# Patient Record
Sex: Male | Born: 1945
Health system: Southern US, Community
[De-identification: ages and names within clinical notes are randomized; demographics above are authoritative.]

## PROBLEM LIST (undated history)

## (undated) DIAGNOSIS — E119 Type 2 diabetes mellitus without complications: Secondary | ICD-10-CM

## (undated) DIAGNOSIS — M1990 Unspecified osteoarthritis, unspecified site: Secondary | ICD-10-CM

## (undated) DIAGNOSIS — R109 Unspecified abdominal pain: Secondary | ICD-10-CM

## (undated) DIAGNOSIS — H00019 Hordeolum externum unspecified eye, unspecified eyelid: Secondary | ICD-10-CM

## (undated) DIAGNOSIS — B0229 Other postherpetic nervous system involvement: Secondary | ICD-10-CM

## (undated) DIAGNOSIS — R51 Headache: Secondary | ICD-10-CM

## (undated) DIAGNOSIS — M545 Low back pain: Secondary | ICD-10-CM

## (undated) DIAGNOSIS — K219 Gastro-esophageal reflux disease without esophagitis: Secondary | ICD-10-CM

## (undated) DIAGNOSIS — B029 Zoster without complications: Secondary | ICD-10-CM

## (undated) DIAGNOSIS — R42 Dizziness and giddiness: Secondary | ICD-10-CM

## (undated) DIAGNOSIS — G40909 Epilepsy, unspecified, not intractable, without status epilepticus: Secondary | ICD-10-CM

## (undated) DIAGNOSIS — J309 Allergic rhinitis, unspecified: Secondary | ICD-10-CM

## (undated) DIAGNOSIS — Z0000 Encounter for general adult medical examination without abnormal findings: Secondary | ICD-10-CM

## (undated) DIAGNOSIS — R569 Unspecified convulsions: Secondary | ICD-10-CM

## (undated) DIAGNOSIS — R52 Pain, unspecified: Secondary | ICD-10-CM

## (undated) HISTORY — DX: Unspecified convulsions: R56.9

## (undated) HISTORY — DX: Dizziness and giddiness: R42

## (undated) HISTORY — DX: Other postherpetic nervous system involvement: B02.29

## (undated) HISTORY — PX: UPPER GASTROINTESTINAL ENDOSCOPY: SHX188

## (undated) HISTORY — PX: KNEE SURGERY: SHX244

## (undated) HISTORY — DX: Allergic rhinitis, unspecified: J30.9

## (undated) HISTORY — DX: Gastro-esophageal reflux disease without esophagitis: K21.9

## (undated) HISTORY — DX: Unspecified abdominal pain: R10.9

## (undated) HISTORY — DX: Headache: R51

## (undated) HISTORY — DX: Encounter for general adult medical examination without abnormal findings: Z00.00

## (undated) HISTORY — PX: OTHER SURGICAL HISTORY: SHX169

## (undated) HISTORY — DX: Epilepsy, unspecified, not intractable, without status epilepticus: G40.909

## (undated) HISTORY — DX: Pain, unspecified: R52

## (undated) HISTORY — PX: COLONOSCOPY: SHX174

## (undated) HISTORY — DX: Hordeolum externum unspecified eye, unspecified eyelid: H00.019

## (undated) HISTORY — DX: Zoster without complications: B02.9

## (undated) HISTORY — DX: Type 2 diabetes mellitus without complications: E11.9

## (undated) HISTORY — DX: Low back pain: M54.5

---

## 2014-05-07 ENCOUNTER — Other Ambulatory Visit (INDEPENDENT_AMBULATORY_CARE_PROVIDER_SITE_OTHER): Payer: MEDICARE

## 2014-05-07 ENCOUNTER — Encounter: Payer: Self-pay | Admitting: Internal Medicine

## 2014-05-07 ENCOUNTER — Ambulatory Visit (INDEPENDENT_AMBULATORY_CARE_PROVIDER_SITE_OTHER): Payer: MEDICARE | Admitting: Internal Medicine

## 2014-05-07 VITALS — BP 126/86 | HR 48 | Temp 97.9°F | Resp 14 | Ht 76.0 in | Wt 245.6 lb

## 2014-05-07 DIAGNOSIS — Z23 Encounter for immunization: Secondary | ICD-10-CM

## 2014-05-07 DIAGNOSIS — Z0000 Encounter for general adult medical examination without abnormal findings: Secondary | ICD-10-CM

## 2014-05-07 DIAGNOSIS — H00016 Hordeolum externum left eye, unspecified eyelid: Secondary | ICD-10-CM

## 2014-05-07 DIAGNOSIS — J309 Allergic rhinitis, unspecified: Secondary | ICD-10-CM | POA: Insufficient documentation

## 2014-05-07 DIAGNOSIS — G40909 Epilepsy, unspecified, not intractable, without status epilepticus: Secondary | ICD-10-CM | POA: Insufficient documentation

## 2014-05-07 DIAGNOSIS — H00019 Hordeolum externum unspecified eye, unspecified eyelid: Secondary | ICD-10-CM | POA: Insufficient documentation

## 2014-05-07 DIAGNOSIS — Z418 Encounter for other procedures for purposes other than remedying health state: Secondary | ICD-10-CM

## 2014-05-07 DIAGNOSIS — Z299 Encounter for prophylactic measures, unspecified: Secondary | ICD-10-CM

## 2014-05-07 DIAGNOSIS — J3089 Other allergic rhinitis: Secondary | ICD-10-CM

## 2014-05-07 HISTORY — DX: Hordeolum externum unspecified eye, unspecified eyelid: H00.019

## 2014-05-07 HISTORY — DX: Epilepsy, unspecified, not intractable, without status epilepticus: G40.909

## 2014-05-07 HISTORY — DX: Encounter for general adult medical examination without abnormal findings: Z00.00

## 2014-05-07 HISTORY — DX: Allergic rhinitis, unspecified: J30.9

## 2014-05-07 LAB — COMPREHENSIVE METABOLIC PANEL
ALT: 8 U/L (ref 0–53)
AST: 18 U/L (ref 0–37)
Albumin: 4 g/dL (ref 3.5–5.2)
Alkaline Phosphatase: 42 U/L (ref 39–117)
BUN: 16 mg/dL (ref 6–23)
CO2: 30 mEq/L (ref 19–32)
Calcium: 9.1 mg/dL (ref 8.4–10.5)
Chloride: 101 mEq/L (ref 96–112)
Creatinine, Ser: 1.1 mg/dL (ref 0.4–1.5)
GFR: 89.86 mL/min (ref >60.00)
Glucose, Bld: 99 mg/dL (ref 70–99)
Potassium: 3.9 mEq/L (ref 3.5–5.1)
Sodium: 136 mEq/L (ref 135–145)
Total Bilirubin: 0.5 mg/dL (ref 0.2–1.2)
Total Protein: 7.6 g/dL (ref 6.0–8.3)

## 2014-05-07 LAB — LIPID PANEL
Cholesterol: 183 mg/dL (ref 0–200)
HDL: 44.1 mg/dL (ref >39.00)
LDL Cholesterol: 124 mg/dL — ABNORMAL HIGH (ref 0–99)
NonHDL: 138.9
Total CHOL/HDL Ratio: 4
Triglycerides: 75 mg/dL (ref 0.0–149.0)
VLDL: 15 mg/dL (ref 0.0–40.0)

## 2014-05-07 LAB — CBC
HCT: 43 % (ref 39.0–52.0)
Hemoglobin: 13.5 g/dL (ref 13.0–17.0)
MCHC: 31.5 g/dL (ref 30.0–36.0)
MCV: 83.5 fl (ref 78.0–100.0)
Platelets: 215 10*3/uL (ref 150.0–400.0)
RBC: 5.15 Mil/uL (ref 4.22–5.81)
RDW: 14.3 % (ref 11.5–15.5)
WBC: 5.3 10*3/uL (ref 4.0–10.5)

## 2014-05-07 LAB — HEMOGLOBIN A1C: Hgb A1c MFr Bld: 6.5 % (ref 4.6–6.5)

## 2014-05-07 MED ORDER — DIVALPROEX SODIUM 250 MG PO DR TAB: 250.0000 mg | DELAYED_RELEASE_TABLET | Freq: Two times a day (BID) | ORAL | Status: DC

## 2014-05-07 MED ORDER — ACYCLOVIR 400 MG PO TABS: 400.0000 mg | ORAL_TABLET | Freq: Three times a day (TID) | ORAL | Status: DC

## 2014-05-07 MED ORDER — ACYCLOVIR 5 % EX CREA: 1.0000 "application " | TOPICAL_CREAM | CUTANEOUS | Status: DC

## 2014-05-07 MED ORDER — FLUTICASONE PROPIONATE 50 MCG/ACT NA SUSP: 2.0000 | Freq: Every day | NASAL | Status: DC

## 2014-05-07 NOTE — Progress Notes (Signed)
Pre visit review using our clinic review tool, if applicable. No additional management support is needed unless otherwise documented below in the visit note. 

## 2014-05-07 NOTE — Assessment & Plan Note (Signed)
Refill his Depakote. At some point he will need to establish with neurology in town, especially if he wishes to trial going off his seizure medication. We'll need to obtain records of his normal EEG/MRI.

## 2014-05-07 NOTE — Patient Instructions (Signed)
We have sent in refills of your medicines. We will give you the pneumonia shot today.  We will check on your blood work and call you back with the results. We would like to see you back in about 6-12 months.  Health Maintenance A healthy lifestyle and preventative care can promote health and wellness.  Maintain regular health, dental, and eye exams.  Eat a healthy diet. Foods like vegetables, fruits, whole grains, low-fat dairy products, and lean protein foods contain the nutrients you need and are low in calories. Decrease your intake of foods high in solid fats, added sugars, and salt. Get information about a proper diet from your health care provider, if necessary.  Regular physical exercise is one of the most important things you can do for your health. Most adults should get at least 150 minutes of moderate-intensity exercise (any activity that increases your heart rate and causes you to sweat) each week. In addition, most adults need muscle-strengthening exercises on 2 or more days a week.   Maintain a healthy weight. The body mass index (BMI) is a screening tool to identify possible weight problems. It provides an estimate of body fat based on height and weight. Your health care provider can find your BMI and can help you achieve or maintain a healthy weight. For males 20 years and older:  A BMI below 18.5 is considered underweight.  A BMI of 18.5 to 24.9 is normal.  A BMI of 25 to 29.9 is considered overweight.  A BMI of 30 and above is considered obese.  Maintain normal blood lipids and cholesterol by exercising and minimizing your intake of saturated fat. Eat a balanced diet with plenty of fruits and vegetables. Blood tests for lipids and cholesterol should begin at age 11 and be repeated every 5 years. If your lipid or cholesterol levels are high, you are over age 71, or you are at high risk for heart disease, you may need your cholesterol levels checked more frequently.Ongoing  high lipid and cholesterol levels should be treated with medicines if diet and exercise are not working.  If you smoke, find out from your health care provider how to quit. If you do not use tobacco, do not start.  Lung cancer screening is recommended for adults aged 64-80 years who are at high risk for developing lung cancer because of a history of smoking. A yearly low-dose CT scan of the lungs is recommended for people who have at least a 30-pack-year history of smoking and are current smokers or have quit within the past 15 years. A pack year of smoking is smoking an average of 1 pack of cigarettes a day for 1 year (for example, a 30-pack-year history of smoking could mean smoking 1 pack a day for 30 years or 2 packs a day for 15 years). Yearly screening should continue until the smoker has stopped smoking for at least 15 years. Yearly screening should be stopped for people who develop a health problem that would prevent them from having lung cancer treatment.  If you choose to drink alcohol, do not have more than 2 drinks per day. One drink is considered to be 12 oz (360 mL) of beer, 5 oz (150 mL) of wine, or 1.5 oz (45 mL) of liquor.  Avoid the use of street drugs. Do not share needles with anyone. Ask for help if you need support or instructions about stopping the use of drugs.  High blood pressure causes heart disease and increases the  risk of stroke. Blood pressure should be checked at least every 1-2 years. Ongoing high blood pressure should be treated with medicines if weight loss and exercise are not effective.  If you are 58-75 years old, ask your health care provider if you should take aspirin to prevent heart disease.  Diabetes screening involves taking a blood sample to check your fasting blood sugar level. This should be done once every 3 years after age 1 if you are at a normal weight and without risk factors for diabetes. Testing should be considered at a younger age or be carried  out more frequently if you are overweight and have at least 1 risk factor for diabetes.  Colorectal cancer can be detected and often prevented. Most routine colorectal cancer screening begins at the age of 109 and continues through age 72. However, your health care provider may recommend screening at an earlier age if you have risk factors for colon cancer. On a yearly basis, your health care provider may provide home test kits to check for hidden blood in the stool. A small camera at the end of a tube may be used to directly examine the colon (sigmoidoscopy or colonoscopy) to detect the earliest forms of colorectal cancer. Talk to your health care provider about this at age 31 when routine screening begins. A direct exam of the colon should be repeated every 5-10 years through age 76, unless early forms of precancerous polyps or small growths are found.  People who are at an increased risk for hepatitis B should be screened for this virus. You are considered at high risk for hepatitis B if:  You were born in a country where hepatitis B occurs often. Talk with your health care provider about which countries are considered high risk.  Your parents were born in a high-risk country and you have not received a shot to protect against hepatitis B (hepatitis B vaccine).  You have HIV or AIDS.  You use needles to inject street drugs.  You live with, or have sex with, someone who has hepatitis B.  You are a man who has sex with other men (MSM).  You get hemodialysis treatment.  You take certain medicines for conditions like cancer, organ transplantation, and autoimmune conditions.  Hepatitis C blood testing is recommended for all people born from [birth year] through 1963 and any individual with known risk factors for hepatitis C.  Healthy men should no longer receive prostate-specific antigen (PSA) blood tests as part of routine cancer screening. Talk to your health care provider about prostate cancer  screening.  Testicular cancer screening is not recommended for adolescents or adult males who have no symptoms. Screening includes self-exam, a health care provider exam, and other screening tests. Consult with your health care provider about any symptoms you have or any concerns you have about testicular cancer.  Practice safe sex. Use condoms and avoid high-risk sexual practices to reduce the spread of sexually transmitted infections (STIs).  You should be screened for STIs, including gonorrhea and chlamydia if:  You are sexually active and are younger than 24 years.  You are older than 24 years, and your health care provider tells you that you are at risk for this type of infection.  Your sexual activity has changed since you were last screened, and you are at an increased risk for chlamydia or gonorrhea. Ask your health care provider if you are at risk.  If you are at risk of being infected with HIV, it is  recommended that you take a prescription medicine daily to prevent HIV infection. This is called pre-exposure prophylaxis (PrEP). You are considered at risk if:  You are a man who has sex with other men (MSM).  You are a heterosexual man who is sexually active with multiple partners.  You take drugs by injection.  You are sexually active with a partner who has HIV.  Talk with your health care provider about whether you are at high risk of being infected with HIV. If you choose to begin PrEP, you should first be tested for HIV. You should then be tested every 3 months for as long as you are taking PrEP.  Use sunscreen. Apply sunscreen liberally and repeatedly throughout the day. You should seek shade when your shadow is shorter than you. Protect yourself by wearing long sleeves, pants, a wide-brimmed hat, and sunglasses year round whenever you are outdoors.  Tell your health care provider of new moles or changes in moles, especially if there is a change in shape or color. Also, tell  your health care provider if a mole is larger than the size of a pencil eraser.  A one-time screening for abdominal aortic aneurysm (AAA) and surgical repair of large AAAs by ultrasound is recommended for men aged 22-75 years who are current or former smokers.  Stay current with your vaccines (immunizations). Document Released: 11/06/2007 Document Revised: 05/15/2013 Document Reviewed: 10/05/2010 [facility] Patient Information 2015 [place], [place]. This information is not intended to replace advice given to you by your health care provider. Make sure you discuss any questions you have with your health care provider.

## 2014-05-07 NOTE — Assessment & Plan Note (Signed)
Does not appear infected, advise usage of warm washcloth several times daily, if he needs an ointment advise just over-the-counter antibiotic ointment on it

## 2014-05-07 NOTE — Assessment & Plan Note (Signed)
Colonoscopy normal September 2015, due for repeat in 2025. Tetanus up-to-date. Already had flu shot this season. Pneumovax given today. Spoke with him about shingles shot.

## 2014-05-07 NOTE — Progress Notes (Signed)
   Subjective:    Patient ID: Nicholas Rasmussen, male    DOB: 06/12/1945, 68 y.o.   MRN: [id number]  HPI The patient is a 68 year old male who comes in today to establish care. He does have past medical history of seizures, GERD. He recently moved from [place]. He did start having seizures in high school and had several, he was placed on seizure medication for a long time. His neurologist in [place] talked to him about stopping his seizure medication after a normal EEG and MRI. Unfortunately he moved shortly thereafter and wanted to be under someone's care before undertaking such a venture. He currently has a stye on his left eye which wants me to look at. He denies any other new complaints, he does play tennis and denies chest pains, breathing problems, abdominal pains. He recently had colonoscopy which was normal in September.  Review of Systems  Constitutional: Negative for fever, activity change, appetite change, fatigue and unexpected weight change.  HENT: Positive for congestion. Negative for ear discharge, ear pain, postnasal drip, rhinorrhea, sinus pressure and sore throat.   Eyes: Positive for redness. Negative for photophobia, pain, discharge, itching and visual disturbance.  Respiratory: Negative for cough, chest tightness, shortness of breath and wheezing.   Cardiovascular: Negative for chest pain, palpitations and leg swelling.  Gastrointestinal: Negative for nausea, abdominal pain, diarrhea, constipation and abdominal distention.  Musculoskeletal: Negative.   Skin: Negative.   Neurological: Negative for dizziness, tremors, seizures, weakness, light-headedness, numbness and headaches.  Psychiatric/Behavioral: Negative.       Objective:   Physical Exam  Constitutional: He is oriented to person, place, and time. He appears well-developed and well-nourished.  HENT:  Head: Normocephalic and atraumatic.  Eyes: EOM are normal.  Neck: Normal range of motion.  Cardiovascular: Normal  rate and regular rhythm.   No murmur heard. Carotids without bruits  Pulmonary/Chest: Effort normal and breath sounds normal. No respiratory distress. He has no wheezes. He has no rales.  Abdominal: Soft. Bowel sounds are normal. He exhibits no distension. There is no tenderness. There is no rebound.  Musculoskeletal: He exhibits no edema.  Neurological: He is alert and oriented to person, place, and time. Coordination normal.  Skin: Skin is warm and dry.   Filed Vitals:   05/07/14 0830  BP: 126/86  Pulse: 48  Temp: 97.9 F (36.6 C)  TempSrc: Oral  Resp: 14  Height: 6\' 4"  (1.93 m)  Weight: 245 lb 9.6 oz (111.403 kg)  SpO2: 97%      Assessment & Plan:

## 2014-05-07 NOTE — Assessment & Plan Note (Signed)
Patient currently taking Zyrtec over-the-counter without much relief, send in prescription for Flonase.

## 2014-05-21 ENCOUNTER — Telehealth: Payer: Self-pay | Admitting: Internal Medicine

## 2014-05-21 ENCOUNTER — Telehealth: Payer: Self-pay | Admitting: *Deleted

## 2014-05-21 NOTE — Telephone Encounter (Signed)
Pt came by office stating that he had recently received a missed call from [place]. Pt states he has been trying to get back in touch with her and wants to make sure nothing came back wrong on his last labs. Pt is anxious to know if he should be concerned with any health information. Please contact pt when request is reviewed.

## 2014-05-21 NOTE — Telephone Encounter (Signed)
[facility] - Client TELEPHONE ADVICE RECORD [facility] Patient Name: KHRISTOPHER KAPAUN Gender: Male DOB: 1945-12-29 Age: 68 Y 1 M 26 D Return Phone Number: [phone] (Primary) Address: City/State/Zip: [place] [address] Client  Primary Care [facility] - Client Client Site [place] Physician [place], [place] Type Call [place] Name Osman [place] Phone Number [phone] Relationship To Patient Self Is this call to report lab results? No Call Type General Information Initial Comment Caller states that he is returning a call from nurse Amy General Information Type Message Only Nurse Assessment Guidelines Guideline Title Affirmed Question Affirmed Notes Nurse Date/Time (Eastern Time) Disp. Time Eilene Ghazi Time) Disposition Final User 05/16/2014 2:24:05 PM General Information Provided Yes Nickie Retort After Care Instructions Given Call Event Type User Date / Time Description

## 2014-05-22 NOTE — Telephone Encounter (Signed)
Left message for patient to call me back. 

## 2014-05-27 NOTE — Telephone Encounter (Signed)
Spoke with patient.

## 2014-06-21 ENCOUNTER — Other Ambulatory Visit: Payer: Self-pay | Admitting: Geriatric Medicine

## 2014-06-21 ENCOUNTER — Telehealth: Payer: Self-pay | Admitting: Internal Medicine

## 2014-06-21 MED ORDER — NIZATIDINE 300 MG PO CAPS: 300.0000 mg | ORAL_CAPSULE | Freq: Every day | ORAL | Status: DC

## 2014-06-21 NOTE — Telephone Encounter (Signed)
Pt called in requesting refill on nizatidine (AXID) 300 MG capsule [[id number]]    [facility] on [address]

## 2014-08-09 ENCOUNTER — Telehealth: Payer: Self-pay | Admitting: Internal Medicine

## 2014-08-09 NOTE — Telephone Encounter (Signed)
Patient need a prescription for some medication his allergies. Please advise

## 2014-08-13 MED ORDER — FLUTICASONE PROPIONATE 50 MCG/ACT NA SUSP: 2.0000 | Freq: Every day | NASAL | Status: DC

## 2014-08-13 NOTE — Telephone Encounter (Signed)
Please advise, thanks.

## 2014-08-13 NOTE — Telephone Encounter (Signed)
Rx for flonase at his pharmacy. Can also take zyrtec over the counter if he wants.

## 2014-08-13 NOTE — Telephone Encounter (Signed)
Patient aware and will go pick up medications.

## 2014-08-23 ENCOUNTER — Telehealth: Payer: Self-pay | Admitting: Internal Medicine

## 2014-08-23 MED ORDER — DIVALPROEX SODIUM 250 MG PO DR TAB: 250.0000 mg | DELAYED_RELEASE_TABLET | Freq: Two times a day (BID) | ORAL | Status: DC

## 2014-08-23 NOTE — Telephone Encounter (Signed)
Patient needs refill for divalproex (DEPAKOTE) 250 MG DR tablet [[id number]]. Verified pharmacy

## 2014-09-16 NOTE — Telephone Encounter (Signed)
Encounter closed

## 2014-09-20 ENCOUNTER — Other Ambulatory Visit: Payer: Self-pay | Admitting: Geriatric Medicine

## 2014-09-20 MED ORDER — DIVALPROEX SODIUM 250 MG PO DR TAB
250.0000 mg | DELAYED_RELEASE_TABLET | Freq: Two times a day (BID) | ORAL | Status: DC
Start: 2014-09-20 — End: 2015-05-11

## 2014-10-29 ENCOUNTER — Encounter (INDEPENDENT_AMBULATORY_CARE_PROVIDER_SITE_OTHER): Payer: Self-pay

## 2014-10-29 ENCOUNTER — Ambulatory Visit (INDEPENDENT_AMBULATORY_CARE_PROVIDER_SITE_OTHER): Payer: PPO | Admitting: Internal Medicine

## 2014-10-29 ENCOUNTER — Encounter: Payer: Self-pay | Admitting: Internal Medicine

## 2014-10-29 VITALS — BP 126/78 | HR 56 | Temp 98.0°F | Resp 16 | Wt 246.0 lb

## 2014-10-29 DIAGNOSIS — K2100 Gastro-esophageal reflux disease with esophagitis, without bleeding: Secondary | ICD-10-CM

## 2014-10-29 DIAGNOSIS — R7303 Prediabetes: Secondary | ICD-10-CM

## 2014-10-29 DIAGNOSIS — R7309 Other abnormal glucose: Secondary | ICD-10-CM

## 2014-10-29 DIAGNOSIS — R0789 Other chest pain: Secondary | ICD-10-CM

## 2014-10-29 MED ORDER — RANITIDINE HCL 150 MG PO TABS: 150.0000 mg | ORAL_TABLET | Freq: Two times a day (BID) | ORAL | Status: DC

## 2014-10-29 NOTE — Progress Notes (Signed)
Pre visit review using our clinic review tool, if applicable. No additional management support is needed unless otherwise documented below in the visit note. 

## 2014-10-29 NOTE — Progress Notes (Signed)
   Subjective:    Patient ID: Nicholas Rasmussen, male    DOB: July 17, 1945, 69 y.o.   MRN: [id number]  HPI   On 10/27/14 he experienced some chest discomfort in the left upper chest which was described as sharp up to level 7.5 and lasting seconds intermittently. It was nonradiating and not associated with diaphoresis. It occurred at rest possibly 60 minutes after eating.  He rarely exercises less than 3 times per month although he does climb 3 flights of stairs up to twice a day without cardiopulmonary symptoms  He has a history of reflux and had endoscopy in [place] in 2014. He had been on Axid but this was discontinued. He has occasional left upper quadrant dull discomfort. He takes up to 10 TUMs per day.Marland Kitchen He is on 81 mg of aspirin but no NSAIDs. He has one half-1 cup of coffee occasionally. He also has a rare soda. He does eat spicy foods. He has occasional chocolate. He does not ingest peppermint. He rarely drinks alcohol.  He has a history of obstructive sleep apnea for which he's had uvulectomy.  There is no family history of heart attack or stroke. His father had diabetes. In December 2015 his A1c was 6.5%. LDL was 124.    Review of Systems Extrinsic symptoms of itchy, watery eyes, sneezing, or angioedema are denied. There is no significant cough, sputum production, wheezing,or  paroxysmal nocturnal dyspnea.  Unexplained weight loss, abdominal pain, significant dyspepsia, dysphagia, melena, rectal bleeding, or persistently small caliber stools are denied.     Objective:   Physical Exam  Pertinent or positive findings include: Arcus senilis is present. He has an upper partial. He is status post uvulectomy.  General appearance :adequately nourished; in no distress.  Eyes: No conjunctival inflammation or scleral icterus is present.  Oral exam:  Lips and gums are healthy appearing.There is no oropharyngeal erythema or exudate noted. Dental hygiene is good.  Heart:  Normal rate and  regular rhythm. S1 and S2 normal without gallop, murmur, click, rub or other extra sounds    Lungs:Chest clear to auscultation; no wheezes, rhonchi,rales ,or rubs present.No increased work of breathing.   Abdomen: bowel sounds normal, soft and non-tender without masses, organomegaly or hernias noted.  No guarding or rebound. No flank tenderness to percussion.  Vascular : all pulses equal ; no bruits present.  Skin:Warm & dry.  Intact without suspicious lesions or rashes ; no tenting   Lymphatic: No lymphadenopathy is noted about the head, neck, axilla  Neuro: Strength, tone & DTRs normal.        Assessment & Plan:  #1 atypical chest pain . EKG is normal except for sinus bradycardia with a rate of 50. Graduated exercise program recommended. He should be seen should he have recurrence of the chest discomfort, especially with exertion.  #2 significant reflux  #3 prediabetes  See orders and recommendations

## 2014-10-29 NOTE — Patient Instructions (Addendum)
Reflux of gastric acid may be asymptomatic as this may occur mainly during sleep.The triggers for reflux  include stress; the "aspirin family" ; alcohol; peppermint; and caffeine (coffee, tea, cola, and chocolate). The aspirin family would include aspirin and the nonsteroidal agents such as ibuprofen &  Naproxen. Tylenol would not cause reflux. If having symptoms ; food & drink should be avoided for @ least 2 hours before going to bed. Take the ranitidine 30 minutes before breakfast and evening meal. If this does not control reflux symptoms;please call   The following nutritional changes may help prevent Diabetes progression & complications.  White carbohydrates (potatoes, rice, bread, and pasta) cause a high spike of the sugar level which stays elevated for a significant period of time (called sugar"load").  For example a  baked potato has a cup of sugar and a  french fry  2 teaspoons of sugar.  More complex carbs such as yams, wild  rice, whole grained bread &  wheat pasta have been much lower spike and persistent load of sugar than the white carbs. The pancreas excretes excess insulin in response to the high spike & load of sugar . Over time the pancreas can actually run out of insulin necessitating insulin shots.  Cardiovascular exercise, this can be as simple a program as walking, is recommended 30-45 minutes 3-4 times per week.As you're not exercising you should take 6-8 weeks to build up to this level.

## 2014-11-12 ENCOUNTER — Encounter: Payer: Self-pay | Admitting: Internal Medicine

## 2014-11-12 ENCOUNTER — Ambulatory Visit (INDEPENDENT_AMBULATORY_CARE_PROVIDER_SITE_OTHER): Payer: PPO | Admitting: Internal Medicine

## 2014-11-12 ENCOUNTER — Other Ambulatory Visit (INDEPENDENT_AMBULATORY_CARE_PROVIDER_SITE_OTHER): Payer: PPO

## 2014-11-12 VITALS — BP 148/70 | HR 62 | Temp 98.3°F | Resp 12 | Ht 76.0 in | Wt 250.0 lb

## 2014-11-12 DIAGNOSIS — G40909 Epilepsy, unspecified, not intractable, without status epilepticus: Secondary | ICD-10-CM

## 2014-11-12 DIAGNOSIS — R7301 Impaired fasting glucose: Secondary | ICD-10-CM | POA: Diagnosis not present

## 2014-11-12 DIAGNOSIS — K219 Gastro-esophageal reflux disease without esophagitis: Secondary | ICD-10-CM

## 2014-11-12 LAB — COMPREHENSIVE METABOLIC PANEL
ALT: 6 U/L (ref 0–53)
AST: 17 U/L (ref 0–37)
Albumin: 4 g/dL (ref 3.5–5.2)
Alkaline Phosphatase: 44 U/L (ref 39–117)
BUN: 17 mg/dL (ref 6–23)
CO2: 30 mEq/L (ref 19–32)
Calcium: 9.4 mg/dL (ref 8.4–10.5)
Chloride: 103 mEq/L (ref 96–112)
Creatinine, Ser: 1.08 mg/dL (ref 0.40–1.50)
GFR: 87.8 mL/min (ref >60.00)
Glucose, Bld: 97 mg/dL (ref 70–99)
Potassium: 3.7 mEq/L (ref 3.5–5.1)
Sodium: 139 mEq/L (ref 135–145)
Total Bilirubin: 0.4 mg/dL (ref 0.2–1.2)
Total Protein: 7.4 g/dL (ref 6.0–8.3)

## 2014-11-12 LAB — HEMOGLOBIN A1C: Hgb A1c MFr Bld: 6.1 % (ref 4.6–6.5)

## 2014-11-12 LAB — TSH: TSH: 1.08 u[IU]/mL (ref 0.35–4.50)

## 2014-11-12 NOTE — Progress Notes (Signed)
Pre visit review using our clinic review tool, if applicable. No additional management support is needed unless otherwise documented below in the visit note. 

## 2014-11-12 NOTE — Patient Instructions (Signed)
We will check on the blood work today and call you back with the results even if everything is normal.   Work on getting back to exercising. Start with 10-15 minutes on the elliptical and then work up to 25 minutes as you are able.   Exercise to Stay Healthy Exercise helps you become and stay healthy. EXERCISE IDEAS AND TIPS Choose exercises that:  You enjoy.  Fit into your day. You do not need to exercise really hard to be healthy. You can do exercises at a slow or medium level and stay healthy. You can:  Stretch before and after working out.  Try yoga, Pilates, or tai chi.  Lift weights.  Walk fast, swim, jog, run, climb stairs, bicycle, dance, or rollerskate.  Take aerobic classes. Exercises that burn about 150 calories:  Running 1  miles in 15 minutes.  Playing volleyball for 45 to 60 minutes.  Washing and waxing a car for 45 to 60 minutes.  Playing touch football for 45 minutes.  Walking 1  miles in 35 minutes.  Pushing a stroller 1  miles in 30 minutes.  Playing basketball for 30 minutes.  Raking leaves for 30 minutes.  Bicycling 5 miles in 30 minutes.  Walking 2 miles in 30 minutes.  Dancing for 30 minutes.  Shoveling snow for 15 minutes.  Swimming laps for 20 minutes.  Walking up stairs for 15 minutes.  Bicycling 4 miles in 15 minutes.  Gardening for 30 to 45 minutes.  Jumping rope for 15 minutes.  Washing windows or floors for 45 to 60 minutes. Document Released: 06/12/2010 Document Revised: 08/02/2011 Document Reviewed: 06/12/2010 [facility] Patient Information 2015 [place], [place]. This information is not intended to replace advice given to you by your health care provider. Make sure you discuss any questions you have with your health care provider.

## 2014-11-13 LAB — VALPROIC ACID LEVEL: Valproic Acid Lvl: 49.1 ug/mL — ABNORMAL LOW (ref 50.0–100.0)

## 2014-11-15 DIAGNOSIS — K219 Gastro-esophageal reflux disease without esophagitis: Secondary | ICD-10-CM | POA: Insufficient documentation

## 2014-11-15 NOTE — Assessment & Plan Note (Signed)
Since he is not seeing neurology right now check depakote level. No seizures since last visit.

## 2014-11-15 NOTE — Progress Notes (Signed)
   Subjective:    Patient ID: Nicholas Rasmussen, male    DOB: [birth year], 69 y.o.   MRN: [id number]  HPI The patient is a 69 YO man coming in for follow up of his atypical chest pain. He started taking ranitidine the last several weeks and the pain is gone. He is doing well and no recurrence. No pains with walking or activity. He has not changed his diet at all. Sometimes eats late at night. No cough or burning in chest or stomach.   Review of Systems  Constitutional: Negative for fever, activity change, appetite change, fatigue and unexpected weight change.  HENT: Negative for ear discharge, ear pain, postnasal drip, rhinorrhea, sinus pressure and sore throat.   Eyes: Negative for photophobia, pain, discharge, itching and visual disturbance.  Respiratory: Negative for cough, chest tightness, shortness of breath and wheezing.   Cardiovascular: Negative for chest pain, palpitations and leg swelling.  Gastrointestinal: Negative for nausea, abdominal pain, diarrhea, constipation and abdominal distention.  Musculoskeletal: Negative.   Skin: Negative.   Neurological: Negative for dizziness, tremors, seizures, weakness, light-headedness, numbness and headaches.  Psychiatric/Behavioral: Negative.       Objective:   Physical Exam  Constitutional: He is oriented to person, place, and time. He appears well-developed and well-nourished.  HENT:  Head: Normocephalic and atraumatic.  Eyes: EOM are normal.  Neck: Normal range of motion.  Cardiovascular: Normal rate and regular rhythm.   No murmur heard. Carotids without bruits  Pulmonary/Chest: Effort normal and breath sounds normal. No respiratory distress. He has no wheezes. He has no rales.  Abdominal: Soft. Bowel sounds are normal. He exhibits no distension. There is no tenderness. There is no rebound.  Musculoskeletal: He exhibits no edema.  Neurological: He is alert and oriented to person, place, and time. Coordination normal.  Skin: Skin is  warm and dry.   Filed Vitals:   11/12/14 1031  BP: 148/70  Pulse: 62  Temp: 98.3 F (36.8 C)  TempSrc: Oral  Resp: 12  Height: 6\' 4"  (1.93 m)  Weight: 250 lb (113.399 kg)  SpO2: 98%      Assessment & Plan:

## 2014-11-15 NOTE — Assessment & Plan Note (Signed)
Doing well on ranitidine. Advised him to continue and gave him suggestions for dietary changes and behavioral changes for the acid reflux.

## 2014-11-18 ENCOUNTER — Telehealth: Payer: Self-pay | Admitting: Internal Medicine

## 2014-11-18 NOTE — Telephone Encounter (Signed)
Spoke with patient and gave him his lab results.

## 2014-11-18 NOTE — Telephone Encounter (Signed)
Patient is calling for the result of his lab work, Please advise

## 2015-01-22 ENCOUNTER — Telehealth: Payer: Self-pay | Admitting: *Deleted

## 2015-01-22 DIAGNOSIS — K2100 Gastro-esophageal reflux disease with esophagitis, without bleeding: Secondary | ICD-10-CM

## 2015-01-22 MED ORDER — RANITIDINE HCL 150 MG PO TABS: 150.0000 mg | ORAL_TABLET | Freq: Two times a day (BID) | ORAL | Status: DC

## 2015-01-22 MED ORDER — CLOTRIMAZOLE-BETAMETHASONE 1-0.05 % EX CREA: 1.0000 "application " | TOPICAL_CREAM | Freq: Two times a day (BID) | CUTANEOUS | Status: DC | PRN

## 2015-01-22 NOTE — Telephone Encounter (Signed)
Pt states he is needing refills on his rantidine, also want to have his Clotrimazole cream refill. Was originally rx by his md in [place] for skin rash/eczema. [facility]

## 2015-01-25 ENCOUNTER — Other Ambulatory Visit: Payer: Self-pay | Admitting: Internal Medicine

## 2015-03-13 ENCOUNTER — Encounter: Payer: Self-pay | Admitting: Internal Medicine

## 2015-03-13 ENCOUNTER — Ambulatory Visit (INDEPENDENT_AMBULATORY_CARE_PROVIDER_SITE_OTHER): Payer: PPO | Admitting: Internal Medicine

## 2015-03-13 VITALS — BP 138/78 | HR 54 | Temp 97.9°F | Resp 16 | Ht 76.0 in | Wt 255.0 lb

## 2015-03-13 DIAGNOSIS — G44209 Tension-type headache, unspecified, not intractable: Secondary | ICD-10-CM

## 2015-03-13 DIAGNOSIS — R51 Headache: Secondary | ICD-10-CM

## 2015-03-13 DIAGNOSIS — R519 Headache, unspecified: Secondary | ICD-10-CM | POA: Insufficient documentation

## 2015-03-13 DIAGNOSIS — Z23 Encounter for immunization: Secondary | ICD-10-CM | POA: Diagnosis not present

## 2015-03-13 HISTORY — DX: Headache, unspecified: R51.9

## 2015-03-13 NOTE — Progress Notes (Signed)
Pre visit review using our clinic review tool, if applicable. No additional management support is needed unless otherwise documented below in the visit note. 

## 2015-03-13 NOTE — Assessment & Plan Note (Signed)
Likely related to abrupt cessation of caffeine and advised him to stay away from excessive caffeine to avoid viscous cycle of headaches and caffeine. Can continue zyrtec over the counter.

## 2015-03-13 NOTE — Progress Notes (Signed)
   Subjective:    Patient ID: Nicholas Rasmussen, male    DOB: 04-24-1946, 69 y.o.   MRN: [id number]  HPI The patient is a 69 YO man coming in for headache. He did notice the headache last night. He took aleve for the headache and went to bed. He woke up this morning and it was gone. He also relates that he stopped drinking soda this week and previously had been drinking quite a lot of it. No facial tenderness. Mild sinus congestion (he started zyrtec yesterday). No fevers, chills, weight loss. No tenderness on his temporal region.   Review of Systems  Constitutional: Negative for fever, activity change, appetite change, fatigue and unexpected weight change.  HENT: Negative for ear discharge, ear pain, postnasal drip, rhinorrhea, sinus pressure and sore throat.   Eyes: Negative for photophobia, pain, discharge, itching and visual disturbance.  Respiratory: Negative for cough, chest tightness, shortness of breath and wheezing.   Cardiovascular: Negative for chest pain, palpitations and leg swelling.  Gastrointestinal: Negative for nausea, abdominal pain, diarrhea, constipation and abdominal distention.  Musculoskeletal: Negative.   Skin: Negative.   Neurological: Positive for headaches. Negative for dizziness, tremors, seizures, weakness, light-headedness and numbness.  Psychiatric/Behavioral: Negative.       Objective:   Physical Exam  Constitutional: He is oriented to person, place, and time. He appears well-developed and well-nourished.  HENT:  Head: Normocephalic and atraumatic.  No temporal tenderness  Eyes: EOM are normal.  Neck: Normal range of motion.  Cardiovascular: Normal rate and regular rhythm.   No murmur heard. Carotids without bruits  Pulmonary/Chest: Effort normal and breath sounds normal. No respiratory distress. He has no wheezes. He has no rales.  Abdominal: Soft. Bowel sounds are normal. He exhibits no distension. There is no tenderness. There is no rebound.    Musculoskeletal: He exhibits no edema.  Neurological: He is alert and oriented to person, place, and time. Coordination normal.  Skin: Skin is warm and dry.   Filed Vitals:   03/13/15 0953  BP: 138/78  Pulse: 54  Temp: 97.9 F (36.6 C)  TempSrc: Oral  Resp: 16  Height: 6\' 4"  (1.93 m)  Weight: 255 lb (115.667 kg)  SpO2: 96%      Assessment & Plan:  Flu shot given at visit.

## 2015-03-13 NOTE — Patient Instructions (Addendum)
I would recommend zyrtec over the counter for the allergies and the drainage to help stop your cough. It can take several weeks after the drainage is gone for the cough to go away.   High-Fiber Diet Fiber, also called dietary fiber, is a type of carbohydrate found in fruits, vegetables, whole grains, and beans. A high-fiber diet can have many health benefits. Your health care provider may recommend a high-fiber diet to help:  Prevent constipation. Fiber can make your bowel movements more regular.  Lower your cholesterol.  Relieve hemorrhoids, uncomplicated diverticulosis, or irritable bowel syndrome.  Prevent overeating as part of a weight-loss plan.  Prevent heart disease, type 2 diabetes, and certain cancers. WHAT IS MY PLAN? The recommended daily intake of fiber includes:  38 grams for men under age 47.  5 grams for men over age 97.  2 grams for women under age 11.  57 grams for women over age 15. You can get the recommended daily intake of dietary fiber by eating a variety of fruits, vegetables, grains, and beans. Your health care provider may also recommend a fiber supplement if it is not possible to get enough fiber through your diet. WHAT DO I NEED TO KNOW ABOUT A HIGH-FIBER DIET?  Fiber supplements have not been widely studied for their effectiveness, so it is better to get fiber through food sources.  Always check the fiber content on thenutrition facts label of any prepackaged food. Look for foods that contain at least 5 grams of fiber per serving.  Ask your dietitian if you have questions about specific foods that are related to your condition, especially if those foods are not listed in the following section.  Increase your daily fiber consumption gradually. Increasing your intake of dietary fiber too quickly may cause bloating, cramping, or gas.  Drink plenty of water. Water helps you to digest fiber. WHAT FOODS CAN I EAT? Grains Whole-grain breads. Multigrain  cereal. Oats and oatmeal. Brown rice. Barley. Bulgur wheat. [place]. Bran muffins. Popcorn. Rye wafer crackers. Vegetables Sweet potatoes. Spinach. Kale. Artichokes. Cabbage. Broccoli. Green peas. Carrots. Squash. Fruits Berries. Pears. Apples. Oranges. Avocados. Prunes and raisins. Dried figs. Meats and Other Protein Sources Navy, kidney, pinto, and soy beans. Split peas. Lentils. Nuts and seeds. Dairy Fiber-fortified yogurt. Beverages Fiber-fortified soy milk. Fiber-fortified orange juice. Other Fiber bars. The items listed above may not be a complete list of recommended foods or beverages. Contact your dietitian for more options. WHAT FOODS ARE NOT RECOMMENDED? Grains White bread. Pasta made with refined flour. White rice. Vegetables Fried potatoes. Canned vegetables. Well-cooked vegetables.  Fruits Fruit juice. Cooked, strained fruit. Meats and Other Protein Sources Fatty cuts of meat. Fried Sales executive or fried fish. Dairy Milk. Yogurt. Cream cheese. Sour cream. Beverages Soft drinks. Other Cakes and pastries. Butter and oils. The items listed above may not be a complete list of foods and beverages to avoid. Contact your dietitian for more information. WHAT ARE SOME TIPS FOR INCLUDING HIGH-FIBER FOODS IN MY DIET?  Eat a wide variety of high-fiber foods.  Make sure that half of all grains consumed each day are whole grains.  Replace breads and cereals made from refined flour or white flour with whole-grain breads and cereals.  Replace white rice with brown rice, bulgur wheat, or millet.  Start the day with a breakfast that is high in fiber, such as a cereal that contains at least 5 grams of fiber per serving.  Use beans in place of meat in soups, salads,  or pasta.  Eat high-fiber snacks, such as berries, raw vegetables, nuts, or popcorn.   This information is not intended to replace advice given to you by your health care provider. Make sure you discuss any questions you  have with your health care provider.   Document Released: 05/10/2005 Document Revised: 05/31/2014 Document Reviewed: 10/23/2013 Elsevier Interactive Patient Education [employer].

## 2015-04-29 ENCOUNTER — Encounter: Payer: Self-pay | Admitting: Internal Medicine

## 2015-04-29 ENCOUNTER — Other Ambulatory Visit (INDEPENDENT_AMBULATORY_CARE_PROVIDER_SITE_OTHER): Payer: PPO

## 2015-04-29 ENCOUNTER — Ambulatory Visit (INDEPENDENT_AMBULATORY_CARE_PROVIDER_SITE_OTHER): Payer: PPO | Admitting: Internal Medicine

## 2015-04-29 VITALS — BP 110/60 | HR 61 | Temp 98.1°F | Resp 14 | Ht 76.0 in | Wt 255.0 lb

## 2015-04-29 DIAGNOSIS — J3089 Other allergic rhinitis: Secondary | ICD-10-CM

## 2015-04-29 DIAGNOSIS — G40909 Epilepsy, unspecified, not intractable, without status epilepticus: Secondary | ICD-10-CM | POA: Diagnosis not present

## 2015-04-29 DIAGNOSIS — R569 Unspecified convulsions: Secondary | ICD-10-CM

## 2015-04-29 DIAGNOSIS — R7301 Impaired fasting glucose: Secondary | ICD-10-CM | POA: Diagnosis not present

## 2015-04-29 LAB — COMPREHENSIVE METABOLIC PANEL
ALT: 7 U/L (ref 0–53)
AST: 16 U/L (ref 0–37)
Albumin: 4 g/dL (ref 3.5–5.2)
Alkaline Phosphatase: 47 U/L (ref 39–117)
BUN: 15 mg/dL (ref 6–23)
CO2: 30 mEq/L (ref 19–32)
Calcium: 9.2 mg/dL (ref 8.4–10.5)
Chloride: 105 mEq/L (ref 96–112)
Creatinine, Ser: 1.05 mg/dL (ref 0.40–1.50)
GFR: 90.58 mL/min (ref >60.00)
Glucose, Bld: 113 mg/dL — ABNORMAL HIGH (ref 70–99)
Potassium: 4.1 mEq/L (ref 3.5–5.1)
Sodium: 140 mEq/L (ref 135–145)
Total Bilirubin: 0.3 mg/dL (ref 0.2–1.2)
Total Protein: 7.6 g/dL (ref 6.0–8.3)

## 2015-04-29 LAB — CBC
HCT: 41.4 % (ref 39.0–52.0)
Hemoglobin: 13.3 g/dL (ref 13.0–17.0)
MCHC: 32.1 g/dL (ref 30.0–36.0)
MCV: 83 fl (ref 78.0–100.0)
Platelets: 216 10*3/uL (ref 150.0–400.0)
RBC: 4.98 Mil/uL (ref 4.22–5.81)
RDW: 14 % (ref 11.5–15.5)
WBC: 5.2 10*3/uL (ref 4.0–10.5)

## 2015-04-29 LAB — HEMOGLOBIN A1C: Hgb A1c MFr Bld: 6.4 % (ref 4.6–6.5)

## 2015-04-29 MED ORDER — DM-GUAIFENESIN ER 30-600 MG PO TB12: 1.0000 | ORAL_TABLET | Freq: Two times a day (BID) | ORAL | Status: DC

## 2015-04-29 MED ORDER — TRIAMCINOLONE ACETONIDE 0.1 % EX CREA: 1.0000 "application " | TOPICAL_CREAM | Freq: Two times a day (BID) | CUTANEOUS | Status: DC

## 2015-04-29 NOTE — Assessment & Plan Note (Signed)
Rx for mucinex as most of his drainage is gone now. Lungs clear and no indication for CXR or antibiotics today.

## 2015-04-29 NOTE — Assessment & Plan Note (Signed)
Checking depakote level, CBC, CMP and HgA1c due to previous impaired fasting sugar. Adjust as needed. Taking depakote 250 mg BID without seizures.

## 2015-04-29 NOTE — Progress Notes (Signed)
Pre visit review using our clinic review tool, if applicable. No additional management support is needed unless otherwise documented below in the visit note. 

## 2015-04-29 NOTE — Patient Instructions (Signed)
We are checking the blood work today and will call you back with the results.   We have sent in the cream for your leg as well as the mucinex for the cough. The cough should go away once your drainage stops.   Think about exercising 3-4 times per week for the weight and for your health.   Exercising to Lose Weight Exercising can help you to lose weight. In order to lose weight through exercise, you need to do vigorous-intensity exercise. You can tell that you are exercising with vigorous intensity if you are breathing very hard and fast and cannot hold a conversation while exercising. Moderate-intensity exercise helps to maintain your current weight. You can tell that you are exercising at a moderate level if you have a higher heart rate and faster breathing, but you are still able to hold a conversation. HOW OFTEN SHOULD I EXERCISE? Choose an activity that you enjoy and set realistic goals. Your health care provider can help you to make an activity plan that works for you. Exercise regularly as directed by your health care provider. This may include:  Doing resistance training twice each week, such as:  Push-ups.  Sit-ups.  Lifting weights.  Using resistance bands.  Doing a given intensity of exercise for a given amount of time. Choose from these options:  150 minutes of moderate-intensity exercise every week.  75 minutes of vigorous-intensity exercise every week.  A mix of moderate-intensity and vigorous-intensity exercise every week. Children, pregnant women, people who are out of shape, people who are overweight, and older adults may need to consult a health care provider for individual recommendations. If you have any sort of medical condition, be sure to consult your health care provider before starting a new exercise program. WHAT ARE SOME ACTIVITIES THAT CAN HELP ME TO LOSE WEIGHT?   Walking at a rate of at least 4.5 miles an hour.  Jogging or running at a rate of 5 miles  per hour.  Biking at a rate of at least 10 miles per hour.  Lap swimming.  Roller-skating or in-line skating.  Cross-country skiing.  Vigorous competitive sports, such as football, basketball, and soccer.  Jumping rope.  Aerobic dancing. HOW CAN I BE MORE ACTIVE IN MY DAY-TO-DAY ACTIVITIES?  Use the stairs instead of the elevator.  Take a walk during your lunch break.  If you drive, park your car farther away from work or school.  If you take public transportation, get off one stop early and walk the rest of the way.  Make all of your phone calls while standing up and walking around.  Get up, stretch, and walk around every 30 minutes throughout the day. WHAT GUIDELINES SHOULD I FOLLOW WHILE EXERCISING?  Do not exercise so much that you hurt yourself, feel dizzy, or get very short of breath.  Consult your health care provider prior to starting a new exercise program.  Wear comfortable clothes and shoes with good support.  Drink plenty of water while you exercise to prevent dehydration or heat stroke. Body water is lost during exercise and must be replaced.  Work out until you breathe faster and your heart beats faster.   This information is not intended to replace advice given to you by your health care provider. Make sure you discuss any questions you have with your health care provider.   Document Released: 06/12/2010 Document Revised: 05/31/2014 Document Reviewed: 10/11/2013 Elsevier Interactive Patient Education [employer].

## 2015-04-29 NOTE — Progress Notes (Signed)
   Subjective:    Patient ID: Nicholas Rasmussen, male    DOB: July 15, 1945, 69 y.o.   MRN: [id number]  HPI The patient is a 69 YO man coming in for cough. He had cold several weeks ago and still has lingering cough. His brother had the same thing and tried mucinex which was helpful. He denies fevers or chills. No drainage. No facial tenderness. No SOB or chest pain. No sputum production.   Review of Systems  Constitutional: Negative for fever, activity change, appetite change, fatigue and unexpected weight change.  HENT: Negative for congestion, ear discharge, ear pain, postnasal drip, rhinorrhea, sinus pressure and sore throat.   Eyes: Negative for photophobia, pain, discharge, itching and visual disturbance.  Respiratory: Positive for cough. Negative for chest tightness, shortness of breath and wheezing.   Cardiovascular: Negative for chest pain, palpitations and leg swelling.  Gastrointestinal: Negative for nausea, abdominal pain, diarrhea, constipation and abdominal distention.  Musculoskeletal: Negative.   Skin: Negative.   Neurological: Negative for dizziness, tremors, seizures, weakness, light-headedness and numbness.      Objective:   Physical Exam  Constitutional: He is oriented to person, place, and time. He appears well-developed and well-nourished.  HENT:  Head: Normocephalic and atraumatic.  Right Ear: External ear normal.  Left Ear: External ear normal.  Mouth/Throat: Oropharynx is clear and moist.  Eyes: EOM are normal.  Neck: Normal range of motion.  Cardiovascular: Normal rate and regular rhythm.   No murmur heard. Pulmonary/Chest: Effort normal and breath sounds normal. No respiratory distress. He has no wheezes. He has no rales.  Abdominal: Soft. Bowel sounds are normal. He exhibits no distension. There is no tenderness. There is no rebound.  Musculoskeletal: He exhibits no edema.  Neurological: He is alert and oriented to person, place, and time. Coordination normal.    Skin: Skin is warm and dry.   Filed Vitals:   04/29/15 1100  BP: 110/60  Pulse: 61  Temp: 98.1 F (36.7 C)  TempSrc: Oral  Resp: 14  Height: 6\' 4"  (1.93 m)  Weight: 255 lb (115.667 kg)  SpO2: 98%      Assessment & Plan:

## 2015-04-30 LAB — VALPROIC ACID LEVEL: Valproic Acid Lvl: 41.6 ug/mL — ABNORMAL LOW (ref 50.0–100.0)

## 2015-05-06 ENCOUNTER — Ambulatory Visit (INDEPENDENT_AMBULATORY_CARE_PROVIDER_SITE_OTHER): Payer: PPO

## 2015-05-06 VITALS — Temp 98.1°F

## 2015-05-06 DIAGNOSIS — Z23 Encounter for immunization: Secondary | ICD-10-CM | POA: Diagnosis not present

## 2015-05-06 NOTE — Progress Notes (Signed)
Pt came in for Prevnar 13 vaccination but did complain about recent cold/productive cough (yellow) body aches and generally "not feeling well". PCP was with a patient but did discuss with Terri Piedra and he advised pt okay for vaccination with no fever. Pt advised of same and decided to proceed with vaccination

## 2015-05-11 ENCOUNTER — Other Ambulatory Visit: Payer: Self-pay | Admitting: Internal Medicine

## 2015-06-06 ENCOUNTER — Ambulatory Visit (INDEPENDENT_AMBULATORY_CARE_PROVIDER_SITE_OTHER): Payer: PPO | Admitting: Internal Medicine

## 2015-06-06 ENCOUNTER — Telehealth: Payer: Self-pay

## 2015-06-06 ENCOUNTER — Encounter: Payer: Self-pay | Admitting: Internal Medicine

## 2015-06-06 VITALS — BP 126/70 | HR 60 | Temp 98.3°F | Resp 14 | Ht 76.0 in | Wt 256.0 lb

## 2015-06-06 DIAGNOSIS — M545 Low back pain, unspecified: Secondary | ICD-10-CM

## 2015-06-06 DIAGNOSIS — G8929 Other chronic pain: Secondary | ICD-10-CM | POA: Insufficient documentation

## 2015-06-06 DIAGNOSIS — R350 Frequency of micturition: Secondary | ICD-10-CM | POA: Diagnosis not present

## 2015-06-06 HISTORY — DX: Low back pain, unspecified: M54.50

## 2015-06-06 LAB — POCT URINALYSIS DIPSTICK
Bilirubin, UA: NEGATIVE
Blood, UA: NEGATIVE
Glucose, UA: NEGATIVE
Ketones, UA: NEGATIVE
Leukocytes, UA: NEGATIVE
Nitrite, UA: NEGATIVE
Protein, UA: NEGATIVE
Spec Grav, UA: 1.025
Urobilinogen, UA: NEGATIVE
pH, UA: 6

## 2015-06-06 MED ORDER — CYCLOBENZAPRINE HCL 5 MG PO TABS: 5.0000 mg | ORAL_TABLET | Freq: Three times a day (TID) | ORAL | Status: DC | PRN

## 2015-06-06 MED ORDER — TIZANIDINE HCL 2 MG PO CAPS: 2.0000 mg | ORAL_CAPSULE | Freq: Three times a day (TID) | ORAL | Status: DC

## 2015-06-06 NOTE — Telephone Encounter (Signed)
Patient called and would like alternative to medication that was called in for him today. Even covered with insurance it is still too expensive for him. Please advise.

## 2015-06-06 NOTE — Progress Notes (Signed)
Pre visit review using our clinic review tool, if applicable. No additional management support is needed unless otherwise documented below in the visit note. 

## 2015-06-06 NOTE — Telephone Encounter (Signed)
Sent in alternative

## 2015-06-06 NOTE — Progress Notes (Signed)
   Subjective:    Patient ID: Nicholas Rasmussen, male    DOB: 08-Aug-1945, 70 y.o.   MRN: [id number]  HPI The patient is a 70 YO man coming in for 1 week of back pain. Denies known injury. Denies fevers or chills or weight change. Denies pain or weakness in his legs. Has tried OTC advil with some relief. Had some old vicodin from dentist which did not help much. More sore with twisting or stretching. Overall stable and not worsening.   Review of Systems  Constitutional: Negative for fever, activity change, appetite change, fatigue and unexpected weight change.  Respiratory: Negative for cough, chest tightness, shortness of breath and wheezing.   Cardiovascular: Negative for chest pain, palpitations and leg swelling.  Gastrointestinal: Negative for nausea, abdominal pain, diarrhea, constipation and abdominal distention.  Musculoskeletal: Positive for back pain and arthralgias.  Skin: Negative.   Neurological: Negative for dizziness, tremors, seizures, weakness, light-headedness and numbness.      Objective:   Physical Exam  Constitutional: He is oriented to person, place, and time. He appears well-developed and well-nourished.  HENT:  Head: Normocephalic and atraumatic.  Mouth/Throat: Oropharynx is clear and moist.  Eyes: EOM are normal.  Neck: Normal range of motion.  Cardiovascular: Normal rate and regular rhythm.   No murmur heard. Pulmonary/Chest: Effort normal and breath sounds normal. No respiratory distress. He has no wheezes. He has no rales.  Abdominal: Soft. Bowel sounds are normal. He exhibits no distension. There is no tenderness. There is no rebound.  Musculoskeletal: He exhibits no edema.  Mild tenderness in the paraspinal muscles low left back, no radiation and no flank pain  Neurological: He is alert and oriented to person, place, and time. Coordination normal.  Skin: Skin is warm and dry.   Filed Vitals:   06/06/15 1318  BP: 126/70  Pulse: 60  Temp: 98.3 F (36.8 C)   TempSrc: Oral  Resp: 14  Height: 6\' 4"  (1.93 m)  Weight: 256 lb (116.121 kg)  SpO2: 98%      Assessment & Plan:

## 2015-06-06 NOTE — Patient Instructions (Signed)
The urine did not show any signs of blood which makes the kidney stones less likely. It is more likely to be muscle pain.  We have sent in a muscle relaxer to help with the pain that is non-drowsy called flexeril. You can take it 1 pill three times per day as needed.   Back Exercises The following exercises strengthen the muscles that help to support the back. They also help to keep the lower back flexible. Doing these exercises can help to prevent back pain or lessen existing pain. If you have back pain or discomfort, try doing these exercises 2-3 times each day or as told by your health care provider. When the pain goes away, do them once each day, but increase the number of times that you repeat the steps for each exercise (do more repetitions). If you do not have back pain or discomfort, do these exercises once each day or as told by your health care provider. EXERCISES Single Knee to Chest Repeat these steps 3-5 times for each leg: 1. Lie on your back on a firm bed or the floor with your legs extended. 2. Bring one knee to your chest. Your other leg should stay extended and in contact with the floor. 3. Hold your knee in place by grabbing your knee or thigh. 4. Pull on your knee until you feel a gentle stretch in your lower back. 5. Hold the stretch for 10-30 seconds. 6. Slowly release and straighten your leg. Pelvic Tilt Repeat these steps 5-10 times: 1. Lie on your back on a firm bed or the floor with your legs extended. 2. Bend your knees so they are pointing toward the ceiling and your feet are flat on the floor. 3. Tighten your lower abdominal muscles to press your lower back against the floor. This motion will tilt your pelvis so your tailbone points up toward the ceiling instead of pointing to your feet or the floor. 4. With gentle tension and even breathing, hold this position for 5-10 seconds. Cat-Cow Repeat these steps until your lower back becomes more flexible: 1. Get into a  hands-and-knees position on a firm surface. Keep your hands under your shoulders, and keep your knees under your hips. You may place padding under your knees for comfort. 2. Let your head hang down, and point your tailbone toward the floor so your lower back becomes rounded like the back of a cat. 3. Hold this position for 5 seconds. 4. Slowly lift your head and point your tailbone up toward the ceiling so your back forms a sagging arch like the back of a cow. 5. Hold this position for 5 seconds. Press-Ups Repeat these steps 5-10 times: 1. Lie on your abdomen (face-down) on the floor. 2. Place your palms near your head, about shoulder-width apart. 3. While you keep your back as relaxed as possible and keep your hips on the floor, slowly straighten your arms to raise the top half of your body and lift your shoulders. Do not use your back muscles to raise your upper torso. You may adjust the placement of your hands to make yourself more comfortable. 4. Hold this position for 5 seconds while you keep your back relaxed. 5. Slowly return to lying flat on the floor. Bridges Repeat these steps 10 times: 1. Lie on your back on a firm surface. 2. Bend your knees so they are pointing toward the ceiling and your feet are flat on the floor. 3. Tighten your buttocks muscles and lift  your buttocks off of the floor until your waist is at almost the same height as your knees. You should feel the muscles working in your buttocks and the back of your thighs. If you do not feel these muscles, slide your feet 1-2 inches farther away from your buttocks. 4. Hold this position for 3-5 seconds. 5. Slowly lower your hips to the starting position, and allow your buttocks muscles to relax completely. If this exercise is too easy, try doing it with your arms crossed over your chest. Abdominal Crunches Repeat these steps 5-10 times: 1. Lie on your back on a firm bed or the floor with your legs extended. 2. Bend your knees  so they are pointing toward the ceiling and your feet are flat on the floor. 3. Cross your arms over your chest. 4. Tip your chin slightly toward your chest without bending your neck. 5. Tighten your abdominal muscles and slowly raise your trunk (torso) high enough to lift your shoulder blades a tiny bit off of the floor. Avoid raising your torso higher than that, because it can put too much stress on your low back and it does not help to strengthen your abdominal muscles. 6. Slowly return to your starting position. Back Lifts Repeat these steps 5-10 times: 1. Lie on your abdomen (face-down) with your arms at your sides, and rest your forehead on the floor. 2. Tighten the muscles in your legs and your buttocks. 3. Slowly lift your chest off of the floor while you keep your hips pressed to the floor. Keep the back of your head in line with the curve in your back. Your eyes should be looking at the floor. 4. Hold this position for 3-5 seconds. 5. Slowly return to your starting position. SEEK MEDICAL CARE IF:  Your back pain or discomfort gets much worse when you do an exercise.  Your back pain or discomfort does not lessen within 2 hours after you exercise. If you have any of these problems, stop doing these exercises right away. Do not do them again unless your health care provider says that you can. SEEK IMMEDIATE MEDICAL CARE IF:  You develop sudden, severe back pain. If this happens, stop doing the exercises right away. Do not do them again unless your health care provider says that you can.   This information is not intended to replace advice given to you by your health care provider. Make sure you discuss any questions you have with your health care provider.   Document Released: 06/17/2004 Document Revised: 01/29/2015 Document Reviewed: 07/04/2014 Elsevier Interactive Patient Education [employer].

## 2015-06-06 NOTE — Assessment & Plan Note (Signed)
Left sided and likely muscular. Rx for tizanidine for muscle relaxation. Can continue to use NSAIDs OTC as well. No indication for imaging. U/A ordered to rule out microscopic hematuria from kidney stones.

## 2015-06-18 ENCOUNTER — Ambulatory Visit (INDEPENDENT_AMBULATORY_CARE_PROVIDER_SITE_OTHER): Payer: PPO | Admitting: Internal Medicine

## 2015-06-18 ENCOUNTER — Encounter: Payer: Self-pay | Admitting: Internal Medicine

## 2015-06-18 VITALS — BP 130/72 | HR 64 | Temp 98.3°F | Resp 14 | Ht 76.0 in | Wt 260.0 lb

## 2015-06-18 DIAGNOSIS — M545 Low back pain, unspecified: Secondary | ICD-10-CM

## 2015-06-18 DIAGNOSIS — R109 Unspecified abdominal pain: Secondary | ICD-10-CM | POA: Diagnosis not present

## 2015-06-18 NOTE — Progress Notes (Signed)
Pre visit review using our clinic review tool, if applicable. No additional management support is needed unless otherwise documented below in the visit note. 

## 2015-06-18 NOTE — Patient Instructions (Signed)
We will check an ultrasound of the stomach to make sure the kidneys are normal.   Work on trying some of these stretches to help the muscles in the back.  Back Exercises The following exercises strengthen the muscles that help to support the back. They also help to keep the lower back flexible. Doing these exercises can help to prevent back pain or lessen existing pain. If you have back pain or discomfort, try doing these exercises 2-3 times each day or as told by your health care provider. When the pain goes away, do them once each day, but increase the number of times that you repeat the steps for each exercise (do more repetitions). If you do not have back pain or discomfort, do these exercises once each day or as told by your health care provider. EXERCISES Single Knee to Chest Repeat these steps 3-5 times for each leg: 1. Lie on your back on a firm bed or the floor with your legs extended. 2. Bring one knee to your chest. Your other leg should stay extended and in contact with the floor. 3. Hold your knee in place by grabbing your knee or thigh. 4. Pull on your knee until you feel a gentle stretch in your lower back. 5. Hold the stretch for 10-30 seconds. 6. Slowly release and straighten your leg. Pelvic Tilt Repeat these steps 5-10 times: 1. Lie on your back on a firm bed or the floor with your legs extended. 2. Bend your knees so they are pointing toward the ceiling and your feet are flat on the floor. 3. Tighten your lower abdominal muscles to press your lower back against the floor. This motion will tilt your pelvis so your tailbone points up toward the ceiling instead of pointing to your feet or the floor. 4. With gentle tension and even breathing, hold this position for 5-10 seconds. Cat-Cow Repeat these steps until your lower back becomes more flexible: 1. Get into a hands-and-knees position on a firm surface. Keep your hands under your shoulders, and keep your knees under your  hips. You may place padding under your knees for comfort. 2. Let your head hang down, and point your tailbone toward the floor so your lower back becomes rounded like the back of a cat. 3. Hold this position for 5 seconds. 4. Slowly lift your head and point your tailbone up toward the ceiling so your back forms a sagging arch like the back of a cow. 5. Hold this position for 5 seconds. Press-Ups Repeat these steps 5-10 times: 1. Lie on your abdomen (face-down) on the floor. 2. Place your palms near your head, about shoulder-width apart. 3. While you keep your back as relaxed as possible and keep your hips on the floor, slowly straighten your arms to raise the top half of your body and lift your shoulders. Do not use your back muscles to raise your upper torso. You may adjust the placement of your hands to make yourself more comfortable. 4. Hold this position for 5 seconds while you keep your back relaxed. 5. Slowly return to lying flat on the floor. Bridges Repeat these steps 10 times: 1. Lie on your back on a firm surface. 2. Bend your knees so they are pointing toward the ceiling and your feet are flat on the floor. 3. Tighten your buttocks muscles and lift your buttocks off of the floor until your waist is at almost the same height as your knees. You should feel the muscles working  in your buttocks and the back of your thighs. If you do not feel these muscles, slide your feet 1-2 inches farther away from your buttocks. 4. Hold this position for 3-5 seconds. 5. Slowly lower your hips to the starting position, and allow your buttocks muscles to relax completely. If this exercise is too easy, try doing it with your arms crossed over your chest. Abdominal Crunches Repeat these steps 5-10 times: 1. Lie on your back on a firm bed or the floor with your legs extended. 2. Bend your knees so they are pointing toward the ceiling and your feet are flat on the floor. 3. Cross your arms over your  chest. 4. Tip your chin slightly toward your chest without bending your neck. 5. Tighten your abdominal muscles and slowly raise your trunk (torso) high enough to lift your shoulder blades a tiny bit off of the floor. Avoid raising your torso higher than that, because it can put too much stress on your low back and it does not help to strengthen your abdominal muscles. 6. Slowly return to your starting position. Back Lifts Repeat these steps 5-10 times: 1. Lie on your abdomen (face-down) with your arms at your sides, and rest your forehead on the floor. 2. Tighten the muscles in your legs and your buttocks. 3. Slowly lift your chest off of the floor while you keep your hips pressed to the floor. Keep the back of your head in line with the curve in your back. Your eyes should be looking at the floor. 4. Hold this position for 3-5 seconds. 5. Slowly return to your starting position. SEEK MEDICAL CARE IF:  Your back pain or discomfort gets much worse when you do an exercise.  Your back pain or discomfort does not lessen within 2 hours after you exercise. If you have any of these problems, stop doing these exercises right away. Do not do them again unless your health care provider says that you can. SEEK IMMEDIATE MEDICAL CARE IF:  You develop sudden, severe back pain. If this happens, stop doing the exercises right away. Do not do them again unless your health care provider says that you can.   This information is not intended to replace advice given to you by your health care provider. Make sure you discuss any questions you have with your health care provider.   Document Released: 06/17/2004 Document Revised: 01/29/2015 Document Reviewed: 07/04/2014 Elsevier Interactive Patient Education [employer].

## 2015-06-18 NOTE — Progress Notes (Signed)
   Subjective:    Patient ID: Nicholas Rasmussen, male    DOB: 14-May-1946, 70 y.o.   MRN: [id number]  HPI The patient is a 70 YO man coming in for back pain and his left sided abdominal pain. The back pain is not radiating into the legs. Still hurting on the left more. The zanaflex is helping some but not resolving. He has not been exercising and no new injury since last time. No weight loss, fevers, chills. His stomach pain is concerning to him as he is worried about his kidneys. No dysuria or hematuria.  Review of Systems  Constitutional: Negative for fever, activity change, appetite change, fatigue and unexpected weight change.  Respiratory: Negative for cough, chest tightness, shortness of breath and wheezing.   Cardiovascular: Negative for chest pain, palpitations and leg swelling.  Gastrointestinal: Positive for abdominal pain. Negative for nausea, diarrhea, constipation and abdominal distention.  Musculoskeletal: Positive for back pain and arthralgias.  Skin: Negative.   Neurological: Negative for dizziness, tremors, seizures, weakness, light-headedness and numbness.      Objective:   Physical Exam  Constitutional: He is oriented to person, place, and time. He appears well-developed and well-nourished.  HENT:  Head: Normocephalic and atraumatic.  Mouth/Throat: Oropharynx is clear and moist.  Eyes: EOM are normal.  Neck: Normal range of motion.  Cardiovascular: Normal rate and regular rhythm.   No murmur heard. Pulmonary/Chest: Effort normal and breath sounds normal. No respiratory distress. He has no wheezes. He has no rales.  Abdominal: Soft. Bowel sounds are normal. He exhibits no distension. There is no tenderness. There is no rebound.  Musculoskeletal: He exhibits tenderness. He exhibits no edema.  Mild tenderness in the paraspinal muscles low left back, no radiation, mild left flank pain  Neurological: He is alert and oriented to person, place, and time. Coordination normal.    Skin: Skin is warm and dry.   Filed Vitals:   06/18/15 1038  BP: 130/72  Pulse: 64  Temp: 98.3 F (36.8 C)  TempSrc: Oral  Resp: 14  Height: 6\' 4"  (1.93 m)  Weight: 260 lb (117.935 kg)  SpO2: 97%      Assessment & Plan:

## 2015-06-19 NOTE — Assessment & Plan Note (Signed)
Getting ultrasound abdomen to look for kidney stones. Would not pursue additional imaging at this time. Continue zanaflex for pain which is helping him.

## 2015-06-26 ENCOUNTER — Ambulatory Visit
Admission: RE | Admit: 2015-06-26 | Discharge: 2015-06-26 | Disposition: A | Discharge status: Home / Self Care | Payer: PPO | Source: Ambulatory Visit | Attending: Internal Medicine | Admitting: Internal Medicine

## 2015-06-26 DIAGNOSIS — R109 Unspecified abdominal pain: Secondary | ICD-10-CM | POA: Diagnosis not present

## 2015-07-23 ENCOUNTER — Other Ambulatory Visit: Payer: Self-pay | Admitting: Internal Medicine

## 2015-07-23 ENCOUNTER — Ambulatory Visit (INDEPENDENT_AMBULATORY_CARE_PROVIDER_SITE_OTHER): Payer: PPO | Admitting: Internal Medicine

## 2015-07-23 ENCOUNTER — Encounter: Payer: Self-pay | Admitting: Internal Medicine

## 2015-07-23 VITALS — BP 142/96 | HR 58 | Temp 98.2°F | Resp 14 | Ht 76.0 in | Wt 260.0 lb

## 2015-07-23 DIAGNOSIS — L989 Disorder of the skin and subcutaneous tissue, unspecified: Secondary | ICD-10-CM

## 2015-07-23 DIAGNOSIS — M545 Low back pain, unspecified: Secondary | ICD-10-CM

## 2015-07-23 NOTE — Progress Notes (Signed)
Pre visit review using our clinic review tool, if applicable. No additional management support is needed unless otherwise documented below in the visit note. 

## 2015-07-23 NOTE — Patient Instructions (Addendum)
I think it is okay to try cleansing the colon if you want.   I suspect that the pain is coming from muscles on the left side and/or back and have given you some exercises to try.   We will get you in with a dermatologist and it is okay to use vaseline on the nose twice a day to help with dryness.   Please schedule your Medicare annual wellness visit with Wynetta Fines, RN to make sure we are keeping up with your health.    Back Exercises The following exercises strengthen the muscles that help to support the back. They also help to keep the lower back flexible. Doing these exercises can help to prevent back pain or lessen existing pain. If you have back pain or discomfort, try doing these exercises 2-3 times each day or as told by your health care provider. When the pain goes away, do them once each day, but increase the number of times that you repeat the steps for each exercise (do more repetitions). If you do not have back pain or discomfort, do these exercises once each day or as told by your health care provider. EXERCISES Single Knee to Chest Repeat these steps 3-5 times for each leg: 1. Lie on your back on a firm bed or the floor with your legs extended. 2. Bring one knee to your chest. Your other leg should stay extended and in contact with the floor. 3. Hold your knee in place by grabbing your knee or thigh. 4. Pull on your knee until you feel a gentle stretch in your lower back. 5. Hold the stretch for 10-30 seconds. 6. Slowly release and straighten your leg. Pelvic Tilt Repeat these steps 5-10 times: 1. Lie on your back on a firm bed or the floor with your legs extended. 2. Bend your knees so they are pointing toward the ceiling and your feet are flat on the floor. 3. Tighten your lower abdominal muscles to press your lower back against the floor. This motion will tilt your pelvis so your tailbone points up toward the ceiling instead of pointing to your feet or the floor. 4. With  gentle tension and even breathing, hold this position for 5-10 seconds. Cat-Cow Repeat these steps until your lower back becomes more flexible: 1. Get into a hands-and-knees position on a firm surface. Keep your hands under your shoulders, and keep your knees under your hips. You may place padding under your knees for comfort. 2. Let your head hang down, and point your tailbone toward the floor so your lower back becomes rounded like the back of a cat. 3. Hold this position for 5 seconds. 4. Slowly lift your head and point your tailbone up toward the ceiling so your back forms a sagging arch like the back of a cow. 5. Hold this position for 5 seconds. Press-Ups Repeat these steps 5-10 times: 1. Lie on your abdomen (face-down) on the floor. 2. Place your palms near your head, about shoulder-width apart. 3. While you keep your back as relaxed as possible and keep your hips on the floor, slowly straighten your arms to raise the top half of your body and lift your shoulders. Do not use your back muscles to raise your upper torso. You may adjust the placement of your hands to make yourself more comfortable. 4. Hold this position for 5 seconds while you keep your back relaxed. 5. Slowly return to lying flat on the floor. Bridges Repeat these steps 10 times:  1. Lie on your back on a firm surface. 2. Bend your knees so they are pointing toward the ceiling and your feet are flat on the floor. 3. Tighten your buttocks muscles and lift your buttocks off of the floor until your waist is at almost the same height as your knees. You should feel the muscles working in your buttocks and the back of your thighs. If you do not feel these muscles, slide your feet 1-2 inches farther away from your buttocks. 4. Hold this position for 3-5 seconds. 5. Slowly lower your hips to the starting position, and allow your buttocks muscles to relax completely. If this exercise is too easy, try doing it with your arms crossed  over your chest. Abdominal Crunches Repeat these steps 5-10 times: 1. Lie on your back on a firm bed or the floor with your legs extended. 2. Bend your knees so they are pointing toward the ceiling and your feet are flat on the floor. 3. Cross your arms over your chest. 4. Tip your chin slightly toward your chest without bending your neck. 5. Tighten your abdominal muscles and slowly raise your trunk (torso) high enough to lift your shoulder blades a tiny bit off of the floor. Avoid raising your torso higher than that, because it can put too much stress on your low back and it does not help to strengthen your abdominal muscles. 6. Slowly return to your starting position. Back Lifts Repeat these steps 5-10 times: 1. Lie on your abdomen (face-down) with your arms at your sides, and rest your forehead on the floor. 2. Tighten the muscles in your legs and your buttocks. 3. Slowly lift your chest off of the floor while you keep your hips pressed to the floor. Keep the back of your head in line with the curve in your back. Your eyes should be looking at the floor. 4. Hold this position for 3-5 seconds. 5. Slowly return to your starting position. SEEK MEDICAL CARE IF:  Your back pain or discomfort gets much worse when you do an exercise.  Your back pain or discomfort does not lessen within 2 hours after you exercise. If you have any of these problems, stop doing these exercises right away. Do not do them again unless your health care provider says that you can. SEEK IMMEDIATE MEDICAL CARE IF:  You develop sudden, severe back pain. If this happens, stop doing the exercises right away. Do not do them again unless your health care provider says that you can.   This information is not intended to replace advice given to you by your health care provider. Make sure you discuss any questions you have with your health care provider.   Document Released: 06/17/2004 Document Revised: 01/29/2015 Document  Reviewed: 07/04/2014 Elsevier Interactive Patient Education [employer].

## 2015-07-24 NOTE — Assessment & Plan Note (Signed)
Overall improving, mild now and seems to be muscular. [place] stomach normal. He will continue to use heat, tylenol, given stretching exercises to help.

## 2015-07-24 NOTE — Progress Notes (Signed)
   Subjective:    Patient ID: Nicholas Rasmussen, male    DOB: 1946/04/20, 70 y.o.   MRN: [id number]  HPI The patient is a 70 YO man coming in for left side pain. He has been having this for about 1-2 months so far. We had ordered an [place] stomach after last visit and this was negative for gallstones, kidney stones. Overall he feels like it is improving. Now mostly with twisting or bending but not all the time.   Review of Systems  Constitutional: Negative for fever, activity change, appetite change, fatigue and unexpected weight change.  Respiratory: Negative for cough, chest tightness, shortness of breath and wheezing.   Cardiovascular: Negative for chest pain, palpitations and leg swelling.  Gastrointestinal: Negative for nausea, abdominal pain, diarrhea, constipation and abdominal distention.  Musculoskeletal: Positive for myalgias and back pain. Negative for arthralgias.  Skin: Negative.   Neurological: Negative for dizziness, tremors, seizures, weakness, light-headedness and numbness.      Objective:   Physical Exam  Constitutional: He is oriented to person, place, and time. He appears well-developed and well-nourished.  HENT:  Head: Normocephalic and atraumatic.  Mouth/Throat: Oropharynx is clear and moist.  Eyes: EOM are normal.  Neck: Normal range of motion.  Cardiovascular: Normal rate and regular rhythm.   Pulmonary/Chest: Effort normal and breath sounds normal. No respiratory distress. He has no wheezes. He has no rales.  Abdominal: Soft. He exhibits no distension. There is no tenderness. There is no rebound.  Musculoskeletal: He exhibits tenderness. He exhibits no edema.  mild left flank pain, no radiation, worse with twisting  Neurological: He is alert and oriented to person, place, and time. Coordination normal.  Skin: Skin is warm and dry.   Filed Vitals:   07/23/15 0850  BP: 142/96  Pulse: 58  Temp: 98.2 F (36.8 C)  TempSrc: Oral  Resp: 14  Height: 6\' 4"  (1.93 m)    Weight: 260 lb (117.935 kg)  SpO2: 98%      Assessment & Plan:

## 2015-08-07 DIAGNOSIS — D225 Melanocytic nevi of trunk: Secondary | ICD-10-CM | POA: Diagnosis not present

## 2015-08-07 DIAGNOSIS — L821 Other seborrheic keratosis: Secondary | ICD-10-CM | POA: Diagnosis not present

## 2015-08-07 DIAGNOSIS — B360 Pityriasis versicolor: Secondary | ICD-10-CM | POA: Diagnosis not present

## 2015-08-07 DIAGNOSIS — L723 Sebaceous cyst: Secondary | ICD-10-CM | POA: Diagnosis not present

## 2015-08-13 ENCOUNTER — Encounter: Payer: Self-pay | Admitting: Family

## 2015-08-13 ENCOUNTER — Ambulatory Visit (INDEPENDENT_AMBULATORY_CARE_PROVIDER_SITE_OTHER): Payer: PPO | Admitting: Family

## 2015-08-13 VITALS — BP 114/68 | HR 62 | Temp 97.5°F | Resp 16 | Ht 76.0 in | Wt 257.0 lb

## 2015-08-13 DIAGNOSIS — R52 Pain, unspecified: Secondary | ICD-10-CM | POA: Insufficient documentation

## 2015-08-13 HISTORY — DX: Pain, unspecified: R52

## 2015-08-13 NOTE — Progress Notes (Signed)
Pre visit review using our clinic review tool, if applicable. No additional management support is needed unless otherwise documented below in the visit note. 

## 2015-08-13 NOTE — Assessment & Plan Note (Signed)
Pain of left side of the body with concern for possible constipation or muscle skeletal origin. Treat conservatively with ice/heat and home exercise therapy for possible abdominal strain. Start over-the-counter medications including Colace and MiraLAX as needed for constipation. Follow-up if symptoms worsen or fail to improve with these interventions for further imaging or treatment.

## 2015-08-13 NOTE — Patient Instructions (Addendum)
Thank you for choosing [facility].  Summary/Instructions:  For constipation - please use colace or Miralax.   If your symptoms worsen or fail to improve, please contact our office for further instruction, or in case of emergency go directly to the emergency room at the closest medical facility.    Low Back Strain With Rehab A strain is an injury in which a tendon or muscle is torn. The muscles and tendons of the lower back are vulnerable to strains. However, these muscles and tendons are very strong and require a great force to be injured. Strains are classified into three categories. Grade 1 strains cause pain, but the tendon is not lengthened. Grade 2 strains include a lengthened ligament, due to the ligament being stretched or partially ruptured. With grade 2 strains there is still function, although the function may be decreased. Grade 3 strains involve a complete tear of the tendon or muscle, and function is usually impaired. SYMPTOMS   Pain in the lower back.  Pain that affects one side more than the other.  Pain that gets worse with movement and may be felt in the hip, buttocks, or back of the thigh.  Muscle spasms of the muscles in the back.  Swelling along the muscles of the back.  Loss of strength of the back muscles.  Crackling sound (crepitation) when the muscles are touched. CAUSES  Lower back strains occur when a force is placed on the muscles or tendons that is greater than they can handle. Common causes of injury include:  Prolonged overuse of the muscle-tendon units in the lower back, usually from incorrect posture.  A single violent injury or force applied to the back. RISK INCREASES WITH:  Sports that involve twisting forces on the spine or a lot of bending at the waist (football, rugby, weightlifting, bowling, golf, tennis, speed skating, racquetball, swimming, running, gymnastics, diving).  Poor strength and flexibility.  Failure to warm up properly  before activity.  Family history of lower back pain or disk disorders.  Previous back injury or surgery (especially fusion).  Poor posture with lifting, especially heavy objects.  Prolonged sitting, especially with poor posture. PREVENTION   Learn and use proper posture when sitting or lifting (maintain proper posture when sitting, lift using the knees and legs, not at the waist).  Warm up and stretch properly before activity.  Allow for adequate recovery between workouts.  Maintain physical fitness:  Strength, flexibility, and endurance.  Cardiovascular fitness. PROGNOSIS  If treated properly, lower back strains usually heal within 6 weeks. RELATED COMPLICATIONS   Recurring symptoms, resulting in a chronic problem.  Chronic inflammation, scarring, and partial muscle-tendon tear.  Delayed healing or resolution of symptoms.  Prolonged disability. TREATMENT  Treatment first involves the use of ice and medicine, to reduce pain and inflammation. The use of strengthening and stretching exercises may help reduce pain with activity. These exercises may be performed at home or with a therapist. Severe injuries may require referral to a therapist for further evaluation and treatment, such as ultrasound. Your caregiver may advise that you wear a back brace or corset, to help reduce pain and discomfort. Often, prolonged bed rest results in greater harm then benefit. Corticosteroid injections may be recommended. However, these should be reserved for the most serious cases. It is important to avoid using your back when lifting objects. At night, sleep on your back on a firm mattress with a pillow placed under your knees. If non-surgical treatment is unsuccessful, surgery may be  needed.  MEDICATION   If pain medicine is needed, nonsteroidal anti-inflammatory medicines (aspirin and ibuprofen), or other minor pain relievers (acetaminophen), are often advised.  Do not take pain medicine for 7  days before surgery.  Prescription pain relievers may be given, if your caregiver thinks they are needed. Use only as directed and only as much as you need.  Ointments applied to the skin may be helpful.  Corticosteroid injections may be given by your caregiver. These injections should be reserved for the most serious cases, because they may only be given a certain number of times. HEAT AND COLD  Cold treatment (icing) should be applied for 10 to 15 minutes every 2 to 3 hours for inflammation and pain, and immediately after activity that aggravates your symptoms. Use ice packs or an ice massage.  Heat treatment may be used before performing stretching and strengthening activities prescribed by your caregiver, physical therapist, or athletic trainer. Use a heat pack or a warm water soak. SEEK MEDICAL CARE IF:   Symptoms get worse or do not improve in 2 to 4 weeks, despite treatment.  You develop numbness, weakness, or loss of bowel or bladder function.  New, unexplained symptoms develop. (Drugs used in treatment may produce side effects.) EXERCISES  RANGE OF MOTION (ROM) AND STRETCHING EXERCISES - Low Back Strain Most people with lower back pain will find that their symptoms get worse with excessive bending forward (flexion) or arching at the lower back (extension). The exercises which will help resolve your symptoms will focus on the opposite motion.  Your physician, physical therapist or athletic trainer will help you determine which exercises will be most helpful to resolve your lower back pain. Do not complete any exercises without first consulting with your caregiver. Discontinue any exercises which make your symptoms worse until you speak to your caregiver.  If you have pain, numbness or tingling which travels down into your buttocks, leg or foot, the goal of the therapy is for these symptoms to move closer to your back and eventually resolve. Sometimes, these leg symptoms will get  better, but your lower back pain may worsen. This is typically an indication of progress in your rehabilitation. Be very alert to any changes in your symptoms and the activities in which you participated in the 24 hours prior to the change. Sharing this information with your caregiver will allow him/her to most efficiently treat your condition.  These exercises may help you when beginning to rehabilitate your injury. Your symptoms may resolve with or without further involvement from your physician, physical therapist or athletic trainer. While completing these exercises, remember:  Restoring tissue flexibility helps normal motion to return to the joints. This allows healthier, less painful movement and activity.  An effective stretch should be held for at least 30 seconds.  A stretch should never be painful. You should only feel a gentle lengthening or release in the stretched tissue. FLEXION RANGE OF MOTION AND STRETCHING EXERCISES: STRETCH - Flexion, Single Knee to Chest   Lie on a firm bed or floor with both legs extended in front of you.  Keeping one leg in contact with the floor, bring your opposite knee to your chest. Hold your leg in place by either grabbing behind your thigh or at your knee.  Pull until you feel a gentle stretch in your lower back. Hold __________ seconds.  Slowly release your grasp and repeat the exercise with the opposite side. Repeat __________ times. Complete this exercise __________ times per  day.  STRETCH - Flexion, Double Knee to Chest   Lie on a firm bed or floor with both legs extended in front of you.  Keeping one leg in contact with the floor, bring your opposite knee to your chest.  Tense your stomach muscles to support your back and then lift your other knee to your chest. Hold your legs in place by either grabbing behind your thighs or at your knees.  Pull both knees toward your chest until you feel a gentle stretch in your lower back. Hold  __________ seconds.  Tense your stomach muscles and slowly return one leg at a time to the floor. Repeat __________ times. Complete this exercise __________ times per day.  STRETCH - Low Trunk Rotation  Lie on a firm bed or floor. Keeping your legs in front of you, bend your knees so they are both pointed toward the ceiling and your feet are flat on the floor.  Extend your arms out to the side. This will stabilize your upper body by keeping your shoulders in contact with the floor.  Gently and slowly drop both knees together to one side until you feel a gentle stretch in your lower back. Hold for __________ seconds.  Tense your stomach muscles to support your lower back as you bring your knees back to the starting position. Repeat the exercise to the other side. Repeat __________ times. Complete this exercise __________ times per day  EXTENSION RANGE OF MOTION AND FLEXIBILITY EXERCISES: STRETCH - Extension, Prone on Elbows   Lie on your stomach on the floor, a bed will be too soft. Place your palms about shoulder width apart and at the height of your head.  Place your elbows under your shoulders. If this is too painful, stack pillows under your chest.  Allow your body to relax so that your hips drop lower and make contact more completely with the floor.  Hold this position for __________ seconds.  Slowly return to lying flat on the floor. Repeat __________ times. Complete this exercise __________ times per day.  RANGE OF MOTION - Extension, Prone Press Ups  Lie on your stomach on the floor, a bed will be too soft. Place your palms about shoulder width apart and at the height of your head.  Keeping your back as relaxed as possible, slowly straighten your elbows while keeping your hips on the floor. You may adjust the placement of your hands to maximize your comfort. As you gain motion, your hands will come more underneath your shoulders.  Hold this position __________  seconds.  Slowly return to lying flat on the floor. Repeat __________ times. Complete this exercise __________ times per day.  RANGE OF MOTION- Quadruped, Neutral Spine   Assume a hands and knees position on a firm surface. Keep your hands under your shoulders and your knees under your hips. You may place padding under your knees for comfort.  Drop your head and point your tail bone toward the ground below you. This will round out your lower back like an angry cat. Hold this position for __________ seconds.  Slowly lift your head and release your tail bone so that your back sags into a large arch, like an old horse.  Hold this position for __________ seconds.  Repeat this until you feel limber in your lower back.  Now, find your "sweet spot." This will be the most comfortable position somewhere between the two previous positions. This is your neutral spine. Once you have found this  position, tense your stomach muscles to support your lower back.  Hold this position for __________ seconds. Repeat __________ times. Complete this exercise __________ times per day.  STRENGTHENING EXERCISES - Low Back Strain These exercises may help you when beginning to rehabilitate your injury. These exercises should be done near your "sweet spot." This is the neutral, low-back arch, somewhere between fully rounded and fully arched, that is your least painful position. When performed in this safe range of motion, these exercises can be used for people who have either a flexion or extension based injury. These exercises may resolve your symptoms with or without further involvement from your physician, physical therapist or athletic trainer. While completing these exercises, remember:   Muscles can gain both the endurance and the strength needed for everyday activities through controlled exercises.  Complete these exercises as instructed by your physician, physical therapist or athletic trainer. Increase the  resistance and repetitions only as guided.  You may experience muscle soreness or fatigue, but the pain or discomfort you are trying to eliminate should never worsen during these exercises. If this pain does worsen, stop and make certain you are following the directions exactly. If the pain is still present after adjustments, discontinue the exercise until you can discuss the trouble with your caregiver. STRENGTHENING - Deep Abdominals, Pelvic Tilt  Lie on a firm bed or floor. Keeping your legs in front of you, bend your knees so they are both pointed toward the ceiling and your feet are flat on the floor.  Tense your lower abdominal muscles to press your lower back into the floor. This motion will rotate your pelvis so that your tail bone is scooping upwards rather than pointing at your feet or into the floor.  With a gentle tension and even breathing, hold this position for __________ seconds. Repeat __________ times. Complete this exercise __________ times per day.  STRENGTHENING - Abdominals, Crunches   Lie on a firm bed or floor. Keeping your legs in front of you, bend your knees so they are both pointed toward the ceiling and your feet are flat on the floor. Cross your arms over your chest.  Slightly tip your chin down without bending your neck.  Tense your abdominals and slowly lift your trunk high enough to just clear your shoulder blades. Lifting higher can put excessive stress on the lower back and does not further strengthen your abdominal muscles.  Control your return to the starting position. Repeat __________ times. Complete this exercise __________ times per day.  STRENGTHENING - Quadruped, Opposite UE/LE Lift   Assume a hands and knees position on a firm surface. Keep your hands under your shoulders and your knees under your hips. You may place padding under your knees for comfort.  Find your neutral spine and gently tense your abdominal muscles so that you can maintain this  position. Your shoulders and hips should form a rectangle that is parallel with the floor and is not twisted.  Keeping your trunk steady, lift your right hand no higher than your shoulder and then your left leg no higher than your hip. Make sure you are not holding your breath. Hold this position __________ seconds.  Continuing to keep your abdominal muscles tense and your back steady, slowly return to your starting position. Repeat with the opposite arm and leg. Repeat __________ times. Complete this exercise __________ times per day.  STRENGTHENING - Lower Abdominals, Double Knee Lift  Lie on a firm bed or floor. Keeping your legs  in front of you, bend your knees so they are both pointed toward the ceiling and your feet are flat on the floor.  Tense your abdominal muscles to brace your lower back and slowly lift both of your knees until they come over your hips. Be certain not to hold your breath.  Hold __________ seconds. Using your abdominal muscles, return to the starting position in a slow and controlled manner. Repeat __________ times. Complete this exercise __________ times per day.  POSTURE AND BODY MECHANICS CONSIDERATIONS - Low Back Strain Keeping correct posture when sitting, standing or completing your activities will reduce the stress put on different body tissues, allowing injured tissues a chance to heal and limiting painful experiences. The following are general guidelines for improved posture. Your physician or physical therapist will provide you with any instructions specific to your needs. While reading these guidelines, remember:  The exercises prescribed by your provider will help you have the flexibility and strength to maintain correct postures.  The correct posture provides the best environment for your joints to work. All of your joints have less wear and tear when properly supported by a spine with good posture. This means you will experience a healthier, less painful  body.  Correct posture must be practiced with all of your activities, especially prolonged sitting and standing. Correct posture is as important when doing repetitive low-stress activities (typing) as it is when doing a single heavy-load activity (lifting). RESTING POSITIONS Consider which positions are most painful for you when choosing a resting position. If you have pain with flexion-based activities (sitting, bending, stooping, squatting), choose a position that allows you to rest in a less flexed posture. You would want to avoid curling into a fetal position on your side. If your pain worsens with extension-based activities (prolonged standing, working overhead), avoid resting in an extended position such as sleeping on your stomach. Most people will find more comfort when they rest with their spine in a more neutral position, neither too rounded nor too arched. Lying on a non-sagging bed on your side with a pillow between your knees, or on your back with a pillow under your knees will often provide some relief. Keep in mind, being in any one position for a prolonged period of time, no matter how correct your posture, can still lead to stiffness. PROPER SITTING POSTURE In order to minimize stress and discomfort on your spine, you must sit with correct posture. Sitting with good posture should be effortless for a healthy body. Returning to good posture is a gradual process. Many people can work toward this most comfortably by using various supports until they have the flexibility and strength to maintain this posture on their own. When sitting with proper posture, your ears will fall over your shoulders and your shoulders will fall over your hips. You should use the back of the chair to support your upper back. Your lower back will be in a neutral position, just slightly arched. You may place a small pillow or folded towel at the base of your lower back for support.  When working at a desk, create an  environment that supports good, upright posture. Without extra support, muscles tire, which leads to excessive strain on joints and other tissues. Keep these recommendations in mind: CHAIR:  A chair should be able to slide under your desk when your back makes contact with the back of the chair. This allows you to work closely.  The chair's height should allow your eyes to  be level with the upper part of your monitor and your hands to be slightly lower than your elbows. BODY POSITION  Your feet should make contact with the floor. If this is not possible, use a foot rest.  Keep your ears over your shoulders. This will reduce stress on your neck and lower back. INCORRECT SITTING POSTURES  If you are feeling tired and unable to assume a healthy sitting posture, do not slouch or slump. This puts excessive strain on your back tissues, causing more damage and pain. Healthier options include:  Using more support, like a lumbar pillow.  Switching tasks to something that requires you to be upright or walking.  Talking a brief walk.  Lying down to rest in a neutral-spine position. PROLONGED STANDING WHILE SLIGHTLY LEANING FORWARD  When completing a task that requires you to lean forward while standing in one place for a long time, place either foot up on a stationary 2-4 inch high object to help maintain the best posture. When both feet are on the ground, the lower back tends to lose its slight inward curve. If this curve flattens (or becomes too large), then the back and your other joints will experience too much stress, tire more quickly, and can cause pain. CORRECT STANDING POSTURES Proper standing posture should be assumed with all daily activities, even if they only take a few moments, like when brushing your teeth. As in sitting, your ears should fall over your shoulders and your shoulders should fall over your hips. You should keep a slight tension in your abdominal muscles to brace your spine.  Your tailbone should point down to the ground, not behind your body, resulting in an over-extended swayback posture.  INCORRECT STANDING POSTURES  Common incorrect standing postures include a forward head, locked knees and/or an excessive swayback. WALKING Walk with an upright posture. Your ears, shoulders and hips should all line-up. PROLONGED ACTIVITY IN A FLEXED POSITION When completing a task that requires you to bend forward at your waist or lean over a low surface, try to find a way to stabilize 3 out of 4 of your limbs. You can place a hand or elbow on your thigh or rest a knee on the surface you are reaching across. This will provide you more stability so that your muscles do not fatigue as quickly. By keeping your knees relaxed, or slightly bent, you will also reduce stress across your lower back. CORRECT LIFTING TECHNIQUES DO :   Assume a wide stance. This will provide you more stability and the opportunity to get as close as possible to the object which you are lifting.  Tense your abdominals to brace your spine. Bend at the knees and hips. Keeping your back locked in a neutral-spine position, lift using your leg muscles. Lift with your legs, keeping your back straight.  Test the weight of unknown objects before attempting to lift them.  Try to keep your elbows locked down at your sides in order get the best strength from your shoulders when carrying an object.  Always ask for help when lifting heavy or awkward objects. INCORRECT LIFTING TECHNIQUES DO NOT:   Lock your knees when lifting, even if it is a small object.  Bend and twist. Pivot at your feet or move your feet when needing to change directions.  Assume that you can safely pick up even a paper clip without proper posture.   This information is not intended to replace advice given to you by your health  care provider. Make sure you discuss any questions you have with your health care provider.   Document Released:  05/10/2005 Document Revised: 05/31/2014 Document Reviewed: 08/22/2008 [employer] Interactive Patient Education [employer].

## 2015-08-13 NOTE — Progress Notes (Signed)
Subjective:    Patient ID: Nicholas Rasmussen, male    DOB: 1946/05/24, 70 y.o.   MRN: [id number]  Chief Complaint  Patient presents with  . Hip Pain    x2 months, has had left side pain, had an US done and never heard anything, wants to figure out whats going on    HPI:  Nicholas Rasmussen is a 70 y.o. male who  has a past medical history of Seizures ([place]) and GERD (gastroesophageal reflux disease). and presents today for a follow up office visit.   Previously evaluated in the office for left side pain which has been going on for about 2 months now. US of the abdomen was negative with no acute abnormalities. Physical exam with mild left flank pain, no radiation, and worse with twisting. Modifying factors include Zanaflex which he did stop taking.  Continues to experience pain located on the left side and worsened with movements. Describes the pain as "pain" and notes that it is aggravated by twisting and moving towards the left and also occasionally while lying on it.    No Known Allergies   Current Outpatient Prescriptions on File Prior to Visit  Medication Sig Dispense Refill  . aspirin 81 MG tablet Take 81 mg by mouth daily.    . divalproex (DEPAKOTE) 250 MG DR tablet TAKE 1 TABLET (250 MG TOTAL) BY MOUTH 2 (TWO) TIMES DAILY. 60 tablet 6  . ranitidine (ZANTAC) 150 MG tablet TAKE 1 TABLET (150 MG TOTAL) BY MOUTH 2 (TWO) TIMES DAILY. 60 tablet 5   No current facility-administered medications on file prior to visit.    Review of Systems  Constitutional: Negative for fever and chills.  Cardiovascular: Negative for chest pain, palpitations and leg swelling.  Musculoskeletal:       Positive for left side pain.       Objective:    BP 114/68 mmHg  Pulse 62  Temp(Src) 97.5 F (36.4 C) (Oral)  Resp 16  Ht 6\' 4"  (1.93 m)  Wt 257 lb (116.574 kg)  BMI 31.30 kg/m2  SpO2 98% Nursing note and vital signs reviewed.  Physical Exam  Constitutional: He is oriented to person, place, and  time. He appears well-developed and well-nourished. No distress.  Cardiovascular: Normal rate, regular rhythm, normal heart sounds and intact distal pulses.   Pulmonary/Chest: Effort normal and breath sounds normal.  Abdominal: Soft. Bowel sounds are normal. He exhibits no distension and no mass. There is no tenderness. There is no rebound and no guarding.  Musculoskeletal:  Low back - no obvious deformity, discoloration, or edema noted. No palpable tenderness able to be elicited. Range of motion is normal in all directions including flexion, extension, lateral bending, and rotation. Distal pulses and sensation are intact and appropriate. Straight leg raise negative; Faber's test negative.  Trunk - no obvious deformity, discoloration, or edema noted. No palpable tenderness able to be elicited. Mild discomfort with right lateral rotation.  Neurological: He is alert and oriented to person, place, and time.  Skin: Skin is warm and dry.  Psychiatric: He has a normal mood and affect. His behavior is normal. Judgment and thought content normal.       Assessment & Plan:   Problem List Items Addressed This Visit      Other   Pain of left side of body - Primary    Pain of left side of the body with concern for possible constipation or muscle skeletal origin. Treat conservatively with ice/heat and home exercise  therapy for possible abdominal strain. Start over-the-counter medications including Colace and MiraLAX as needed for constipation. Follow-up if symptoms worsen or fail to improve with these interventions for further imaging or treatment.

## 2015-10-28 ENCOUNTER — Encounter: Payer: Self-pay | Admitting: Internal Medicine

## 2015-10-28 ENCOUNTER — Other Ambulatory Visit (INDEPENDENT_AMBULATORY_CARE_PROVIDER_SITE_OTHER): Payer: PPO

## 2015-10-28 ENCOUNTER — Ambulatory Visit (INDEPENDENT_AMBULATORY_CARE_PROVIDER_SITE_OTHER): Payer: PPO | Admitting: Internal Medicine

## 2015-10-28 VITALS — BP 110/70 | HR 53 | Temp 98.1°F | Wt 256.0 lb

## 2015-10-28 DIAGNOSIS — Z1159 Encounter for screening for other viral diseases: Secondary | ICD-10-CM

## 2015-10-28 DIAGNOSIS — G40909 Epilepsy, unspecified, not intractable, without status epilepticus: Secondary | ICD-10-CM

## 2015-10-28 DIAGNOSIS — Z0000 Encounter for general adult medical examination without abnormal findings: Secondary | ICD-10-CM

## 2015-10-28 LAB — LIPID PANEL
Cholesterol: 176 mg/dL (ref 0–200)
HDL: 47.5 mg/dL (ref >39.00)
LDL Cholesterol: 104 mg/dL — ABNORMAL HIGH (ref 0–99)
NonHDL: 128.18
Total CHOL/HDL Ratio: 4
Triglycerides: 122 mg/dL (ref 0.0–149.0)
VLDL: 24.4 mg/dL (ref 0.0–40.0)

## 2015-10-28 LAB — COMPREHENSIVE METABOLIC PANEL
ALT: 7 U/L (ref 0–53)
AST: 17 U/L (ref 0–37)
Albumin: 4.3 g/dL (ref 3.5–5.2)
Alkaline Phosphatase: 51 U/L (ref 39–117)
BUN: 14 mg/dL (ref 6–23)
CO2: 32 mEq/L (ref 19–32)
Calcium: 9.8 mg/dL (ref 8.4–10.5)
Chloride: 103 mEq/L (ref 96–112)
Creatinine, Ser: 1.11 mg/dL (ref 0.40–1.50)
GFR: 84.82 mL/min (ref >60.00)
Glucose, Bld: 91 mg/dL (ref 70–99)
Potassium: 4.1 mEq/L (ref 3.5–5.1)
Sodium: 140 mEq/L (ref 135–145)
Total Bilirubin: 0.4 mg/dL (ref 0.2–1.2)
Total Protein: 7.6 g/dL (ref 6.0–8.3)

## 2015-10-28 LAB — CBC
HCT: 40.4 % (ref 39.0–52.0)
Hemoglobin: 13.1 g/dL (ref 13.0–17.0)
MCHC: 32.4 g/dL (ref 30.0–36.0)
MCV: 81.1 fl (ref 78.0–100.0)
Platelets: 206 10*3/uL (ref 150.0–400.0)
RBC: 4.98 Mil/uL (ref 4.22–5.81)
RDW: 14.8 % (ref 11.5–15.5)
WBC: 5.6 10*3/uL (ref 4.0–10.5)

## 2015-10-28 LAB — HEMOGLOBIN A1C: Hgb A1c MFr Bld: 6.3 % (ref 4.6–6.5)

## 2015-10-28 MED ORDER — ACYCLOVIR 200 MG PO CAPS: 200.0000 mg | ORAL_CAPSULE | Freq: Every day | ORAL | Status: DC

## 2015-10-28 NOTE — Progress Notes (Signed)
   Subjective:    Patient ID: Nicholas Rasmussen, male    DOB: 07-Feb-1946, 70 y.o.   MRN: [id number]  HPI Here for medicare wellness, no new complaints. Please see A/P for status and treatment of chronic medical problems.   Diet: heart healthy Physical activity: sedentary Depression/mood screen: negative Hearing: intact to whispered voice, mild loss Visual acuity: grossly normal, performs annual eye exam  ADLs: capable Fall risk: none Home safety: good Cognitive evaluation: intact to orientation, naming, recall and repetition EOL planning: adv directives discussed  I have personally reviewed and have noted 1. The patient's medical and social history - reviewed today no changes 2. Their use of alcohol, tobacco or illicit drugs 3. Their current medications and supplements 4. The patient's functional ability including ADL's, fall risks, home safety risks and hearing or visual impairment. 5. Diet and physical activities 6. Evidence for depression or mood disorders 7. Care team reviewed and updated (available in snapshot)  Review of Systems  Constitutional: Negative for fever, activity change, appetite change, fatigue and unexpected weight change.  HENT: Negative for congestion, ear discharge, ear pain, postnasal drip, rhinorrhea, sinus pressure and sore throat.   Eyes: Negative for photophobia, pain, discharge, itching and visual disturbance.  Respiratory: Negative for cough, chest tightness, shortness of breath and wheezing.   Cardiovascular: Negative for chest pain, palpitations and leg swelling.  Gastrointestinal: Negative for nausea, abdominal pain, diarrhea, constipation and abdominal distention.  Musculoskeletal: Negative.   Skin: Negative.   Neurological: Negative for dizziness, tremors, seizures, weakness, light-headedness and numbness.  Psychiatric/Behavioral: Negative.       Objective:   Physical Exam  Constitutional: He is oriented to person, place, and time. He appears  well-developed and well-nourished.  HENT:  Head: Normocephalic and atraumatic.  Right Ear: External ear normal.  Left Ear: External ear normal.  Mouth/Throat: Oropharynx is clear and moist.  Eyes: EOM are normal.  Neck: Normal range of motion.  Cardiovascular: Normal rate and regular rhythm.   No murmur heard. Carotids without murmur  Pulmonary/Chest: Effort normal and breath sounds normal. No respiratory distress. He has no wheezes. He has no rales.  Abdominal: Soft. Bowel sounds are normal. He exhibits no distension. There is no tenderness. There is no rebound.  Musculoskeletal: He exhibits no edema.  Neurological: He is alert and oriented to person, place, and time. Coordination normal.  Skin: Skin is warm and dry.  Psychiatric: He has a normal mood and affect.    Filed Vitals:   10/28/15 1347  BP: 110/70  Pulse: 53  Temp: 98.1 F (36.7 C)  TempSrc: Oral  Weight: 256 lb (116.121 kg)  SpO2: 97%      Assessment & Plan:

## 2015-10-28 NOTE — Patient Instructions (Signed)
We are checking the blood work today.   Try to call the insurance company and find out if they cover the shingles shot. They may want you to have it at our office or they may want you to have it at the pharmacy. This helps to boost your immunity to the chicken pox virus and prevent you from getting shingles.   Health Maintenance, Male A healthy lifestyle and preventative care can promote health and wellness.  Maintain regular health, dental, and eye exams.  Eat a healthy diet. Foods like vegetables, fruits, whole grains, low-fat dairy products, and lean protein foods contain the nutrients you need and are low in calories. Decrease your intake of foods high in solid fats, added sugars, and salt. Get information about a proper diet from your health care provider, if necessary.  Regular physical exercise is one of the most important things you can do for your health. Most adults should get at least 150 minutes of moderate-intensity exercise (any activity that increases your heart rate and causes you to sweat) each week. In addition, most adults need muscle-strengthening exercises on 2 or more days a week.   Maintain a healthy weight. The body mass index (BMI) is a screening tool to identify possible weight problems. It provides an estimate of body fat based on height and weight. Your health care provider can find your BMI and can help you achieve or maintain a healthy weight. For males 20 years and older:  A BMI below 18.5 is considered underweight.  A BMI of 18.5 to 24.9 is normal.  A BMI of 25 to 29.9 is considered overweight.  A BMI of 30 and above is considered obese.  Maintain normal blood lipids and cholesterol by exercising and minimizing your intake of saturated fat. Eat a balanced diet with plenty of fruits and vegetables. Blood tests for lipids and cholesterol should begin at age 56 and be repeated every 5 years. If your lipid or cholesterol levels are high, you are over age 65, or you  are at high risk for heart disease, you may need your cholesterol levels checked more frequently.Ongoing high lipid and cholesterol levels should be treated with medicines if diet and exercise are not working.  If you smoke, find out from your health care provider how to quit. If you do not use tobacco, do not start.  Lung cancer screening is recommended for adults aged 85-80 years who are at high risk for developing lung cancer because of a history of smoking. A yearly low-dose CT scan of the lungs is recommended for people who have at least a 30-pack-year history of smoking and are current smokers or have quit within the past 15 years. A pack year of smoking is smoking an average of 1 pack of cigarettes a day for 1 year (for example, a 30-pack-year history of smoking could mean smoking 1 pack a day for 30 years or 2 packs a day for 15 years). Yearly screening should continue until the smoker has stopped smoking for at least 15 years. Yearly screening should be stopped for people who develop a health problem that would prevent them from having lung cancer treatment.  If you choose to drink alcohol, do not have more than 2 drinks per day. One drink is considered to be 12 oz (360 mL) of beer, 5 oz (150 mL) of wine, or 1.5 oz (45 mL) of liquor.  Avoid the use of street drugs. Do not share needles with anyone. Ask for help  if you need support or instructions about stopping the use of drugs.  High blood pressure causes heart disease and increases the risk of stroke. High blood pressure is more likely to develop in:  People who have blood pressure in the end of the normal range (100-139/85-89 mm Hg).  People who are overweight or obese.  People who are African American.  If you are 14-37 years of age, have your blood pressure checked every 3-5 years. If you are 33 years of age or older, have your blood pressure checked every year. You should have your blood pressure measured twice--once when you are at  a hospital or clinic, and once when you are not at a hospital or clinic. Record the average of the two measurements. To check your blood pressure when you are not at a hospital or clinic, you can use:  An automated blood pressure machine at a pharmacy.  A home blood pressure monitor.  If you are 64-44 years old, ask your health care provider if you should take aspirin to prevent heart disease.  Diabetes screening involves taking a blood sample to check your fasting blood sugar level. This should be done once every 3 years after age 54 if you are at a normal weight and without risk factors for diabetes. Testing should be considered at a younger age or be carried out more frequently if you are overweight and have at least 1 risk factor for diabetes.  Colorectal cancer can be detected and often prevented. Most routine colorectal cancer screening begins at the age of 13 and continues through age 86. However, your health care provider may recommend screening at an earlier age if you have risk factors for colon cancer. On a yearly basis, your health care provider may provide home test kits to check for hidden blood in the stool. A small camera at the end of a tube may be used to directly examine the colon (sigmoidoscopy or colonoscopy) to detect the earliest forms of colorectal cancer. Talk to your health care provider about this at age 44 when routine screening begins. A direct exam of the colon should be repeated every 5-10 years through age 42, unless early forms of precancerous polyps or small growths are found.  People who are at an increased risk for hepatitis B should be screened for this virus. You are considered at high risk for hepatitis B if:  You were born in a country where hepatitis B occurs often. Talk with your health care provider about which countries are considered high risk.  Your parents were born in a high-risk country and you have not received a shot to protect against hepatitis B  (hepatitis B vaccine).  You have HIV or AIDS.  You use needles to inject street drugs.  You live with, or have sex with, someone who has hepatitis B.  You are a man who has sex with other men (MSM).  You get hemodialysis treatment.  You take certain medicines for conditions like cancer, organ transplantation, and autoimmune conditions.  Hepatitis C blood testing is recommended for all people born from [birth year] through 1963 and any individual with known risk factors for hepatitis C.  Healthy men should no longer receive prostate-specific antigen (PSA) blood tests as part of routine cancer screening. Talk to your health care provider about prostate cancer screening.  Testicular cancer screening is not recommended for adolescents or adult males who have no symptoms. Screening includes self-exam, a health care provider exam, and other screening tests.  Consult with your health care provider about any symptoms you have or any concerns you have about testicular cancer.  Practice safe sex. Use condoms and avoid high-risk sexual practices to reduce the spread of sexually transmitted infections (STIs).  You should be screened for STIs, including gonorrhea and chlamydia if:  You are sexually active and are younger than 24 years.  You are older than 24 years, and your health care provider tells you that you are at risk for this type of infection.  Your sexual activity has changed since you were last screened, and you are at an increased risk for chlamydia or gonorrhea. Ask your health care provider if you are at risk.  If you are at risk of being infected with HIV, it is recommended that you take a prescription medicine daily to prevent HIV infection. This is called pre-exposure prophylaxis (PrEP). You are considered at risk if:  You are a man who has sex with other men (MSM).  You are a heterosexual man who is sexually active with multiple partners.  You take drugs by injection.  You are  sexually active with a partner who has HIV.  Talk with your health care provider about whether you are at high risk of being infected with HIV. If you choose to begin PrEP, you should first be tested for HIV. You should then be tested every 3 months for as long as you are taking PrEP.  Use sunscreen. Apply sunscreen liberally and repeatedly throughout the day. You should seek shade when your shadow is shorter than you. Protect yourself by wearing long sleeves, pants, a wide-brimmed hat, and sunglasses year round whenever you are outdoors.  Tell your health care provider of new moles or changes in moles, especially if there is a change in shape or color. Also, tell your health care provider if a mole is larger than the size of a pencil eraser.  A one-time screening for abdominal aortic aneurysm (AAA) and surgical repair of large AAAs by ultrasound is recommended for men aged 28-75 years who are current or former smokers.  Stay current with your vaccines (immunizations).   This information is not intended to replace advice given to you by your health care provider. Make sure you discuss any questions you have with your health care provider.   Document Released: 11/06/2007 Document Revised: 05/31/2014 Document Reviewed: 10/05/2010 Elsevier Interactive Patient Education [employer].

## 2015-10-28 NOTE — Progress Notes (Signed)
Pre visit review using our clinic review tool, if applicable. No additional management support is needed unless otherwise documented below in the visit note. 

## 2015-10-29 LAB — HEPATITIS C ANTIBODY: HCV Ab: NEGATIVE

## 2015-10-29 LAB — VALPROIC ACID LEVEL: Valproic Acid Lvl: 43.2 ug/mL — ABNORMAL LOW (ref 50.0–100.0)

## 2015-10-31 NOTE — Assessment & Plan Note (Signed)
Checking levels, taking depokote 250 mg BID and adjust as needed. No recent seizures (last one decades ago).

## 2015-10-31 NOTE — Assessment & Plan Note (Signed)
Patient will check with insurance about coverage of shingles shot, hep c screening today done. Colonoscopy due in 2025, tetanus due in 2023, counseled about need for regular exercise. Given 10 year screening recommendations.

## 2015-11-10 ENCOUNTER — Telehealth: Payer: Self-pay

## 2015-11-10 NOTE — Telephone Encounter (Signed)
Patient called and wanted to know if it is a good idea for him to have a the test "[employer] screening". He forgot to ask the last time he was here. Please advise or follow up. Thank you.

## 2015-11-11 NOTE — Telephone Encounter (Signed)
I'm not sure what the [facility] screening entails. Depending on what tests they are doing would depend on if it is useful or whether it is not needed.

## 2015-11-12 ENCOUNTER — Other Ambulatory Visit: Payer: PPO

## 2015-11-12 ENCOUNTER — Ambulatory Visit (INDEPENDENT_AMBULATORY_CARE_PROVIDER_SITE_OTHER): Payer: PPO | Admitting: Family

## 2015-11-12 VITALS — BP 118/70 | HR 58 | Temp 97.8°F | Ht 76.0 in | Wt 259.5 lb

## 2015-11-12 DIAGNOSIS — L02419 Cutaneous abscess of limb, unspecified: Secondary | ICD-10-CM | POA: Diagnosis not present

## 2015-11-12 MED ORDER — MUPIROCIN CALCIUM 2 % EX CREA: 1.0000 "application " | TOPICAL_CREAM | Freq: Three times a day (TID) | CUTANEOUS | Status: DC

## 2015-11-12 MED ORDER — CEPHALEXIN 500 MG PO CAPS
500.0000 mg | ORAL_CAPSULE | Freq: Four times a day (QID) | ORAL | Status: DC
Start: 2015-11-12 — End: 2016-01-02

## 2015-11-12 NOTE — Progress Notes (Signed)
Pre visit review using our clinic review tool, if applicable. No additional management support is needed unless otherwise documented below in the visit note. 

## 2015-11-12 NOTE — Progress Notes (Deleted)
Subjective:    Patient ID: Nicholas Rasmussen, male    DOB: 05/21/1946, 70 y.o.   MRN: [id number]   Nicholas Rasmussen is a 70 y.o. male who presents today for an acute visit.    HPI Comments: Patient here for evaluation of boil under left arm for past week, improving with home remedy with onion. It started this drain on its own. No history of MRSA. No fever, chills.    Chief Complaint  Patient presents with  . Recurrent Skin Infections    Patient stated that there is a boil under the left arm. Patient stated that it started draining three days ago after using home remedy (onion).      Past Medical History  Diagnosis Date  . Seizures ([place])   . GERD (gastroesophageal reflux disease)    Allergies: Review of patient's allergies indicates no known allergies. Current Outpatient Prescriptions on File Prior to Visit  Medication Sig Dispense Refill  . aspirin 81 MG tablet Take 81 mg by mouth daily.    . divalproex (DEPAKOTE) 250 MG DR tablet TAKE 1 TABLET (250 MG TOTAL) BY MOUTH 2 (TWO) TIMES DAILY. 60 tablet 6  . ranitidine (ZANTAC) 150 MG tablet TAKE 1 TABLET (150 MG TOTAL) BY MOUTH 2 (TWO) TIMES DAILY. 60 tablet 5  . acyclovir (ZOVIRAX) 200 MG capsule Take 1 capsule (200 mg total) by mouth 5 (five) times daily. (Patient not taking: Reported on 11/12/2015) 60 capsule 6   No current facility-administered medications on file prior to visit.    Social History  Substance Use Topics  . Smoking status: Former Research scientist (life sciences)  . Smokeless tobacco: Not on file  . Alcohol Use: No    Review of Systems  Constitutional: Negative for fever and chills.  Respiratory: Negative for cough.   Cardiovascular: Negative for chest pain and palpitations.  Gastrointestinal: Negative for nausea and vomiting.  Skin: Positive for wound. Negative for rash.      Objective:    BP 118/70 mmHg  Pulse 58  Temp(Src) 97.8 F (36.6 C) (Oral)  Ht 6\' 4"  (1.93 m)  Wt 259 lb 8 oz (117.708 kg)  BMI 31.60 kg/m2  SpO2  97%   Physical Exam  Constitutional: He appears well-developed and well-nourished.  Cardiovascular: Regular rhythm and normal heart sounds.   Pulmonary/Chest: Effort normal and breath sounds normal. No respiratory distress. He has no wheezes. He has no rhonchi. He has no rales.  Lymphadenopathy:       Head (left side): No submandibular and no preauricular adenopathy present.  Neurological: He is alert.  Skin: Skin is warm and dry.  Psychiatric: He has a normal mood and affect. His speech is normal and behavior is normal.  Vitals reviewed.   Patient does not have previous history of MRSA.  Patient does not have diabetes.   The patient gave informed consent for the procedure. Patient and I discussed risks, benefits, and alternatives to I & D.  Including risk of infection from laceration, localized pain, and bleeding. We discussed the option for oral antibiotics. Patient verbalized understanding to conversation and all questions were answered. We jointly decided to proceed with procedure.  On exam, there is a 6 cm indurated abscess, spontaneously draining purulent yellow discharge.  It is tender to palpation.  There is erythema.  The patient gave informed consent for the procedure. The area was prepped with antiseptic solution. The area was anesthetized using lidocaine. The patient tolerated the anesthetic well.    #11 blade was used  to incise the abscess.  Purulent bloody, and sebaceous drainage was noted.  Bleeding from the wound was controlled by applying direct pressure with gauze.   Iodoform was used to pack the abscess.  A bandage was placed.  Wound culture was obtained.  The patient tolerated the procedure well.  Extensive instructions were given to the patient.   Complications: None  Take antibiotic as directed.  Follow-up for wound check in 2-3 days.     Assessment & Plan:   1. Abscess of axilla Well tolerated I & D. Empiric coverage with oral antibiotics.   - WOUND  CULTURE; Future - mupirocin cream (BACTROBAN) 2 %; Apply 1 application topically 3 (three) times daily.  Dispense: 15 g; Refill: 0 - cephALEXin (KEFLEX) 500 MG capsule; Take 1 capsule (500 mg total) by mouth 4 (four) times daily.  Dispense: 28 capsule; Refill: 0    I am having [name] maintain his aspirin, divalproex, ranitidine, and acyclovir.   No orders of the defined types were placed in this encounter.     Start medications as prescribed and explained to patient on After Visit Summary ( AVS). Risks, benefits, and alternatives of the medications and treatment plan prescribed today were discussed, and patient expressed understanding.   Education regarding symptom management and diagnosis given to patient.   Follow-up:Plan follow-up and return precautions given if any worsening symptoms or change in condition.   Continue to follow with [name], MD for routine health maintenance.   [name] and I agreed with plan.   Mable Paris, FNP

## 2015-11-12 NOTE — Patient Instructions (Signed)
Let us know if not better after drainage on on oral antibiotics.   You may use antibiotic cream to prevent future boils.   If there is no improvement in your symptoms, or if there is any worsening of symptoms, or if you have any additional concerns, please return for re-evaluation; or, if we are closed, consider going to the Emergency Room for evaluation if symptoms urgent.  Incision and Drainage, Care After Refer to this sheet in the next few weeks. These instructions provide you with information on caring for yourself after your procedure. Your caregiver may also give you more specific instructions. Your treatment has been planned according to current medical practices, but problems sometimes occur. Call your caregiver if you have any problems or questions after your procedure. HOME CARE INSTRUCTIONS   If antibiotic medicine is given, take it as directed. Finish it even if you start to feel better.  Only take over-the-counter or prescription medicines for pain, discomfort, or fever as directed by your caregiver.  Keep all follow-up appointments as directed by your caregiver.  Change any bandages (dressings) as directed by your caregiver. Replace old dressings with clean dressings.  Wash your hands before and after caring for your wound. You will receive specific instructions for cleansing and caring for your wound.  SEEK MEDICAL CARE IF:   You have increased pain, swelling, or redness around the wound.  You have increased drainage, smell, or bleeding from the wound.  You have muscle aches, chills, or you feel generally sick.  You have a fever. MAKE SURE YOU:   Understand these instructions.  Will watch your condition.  Will get help right away if you are not doing well or get worse.   This information is not intended to replace advice given to you by your health care provider. Make sure you discuss any questions you have with your health care provider.   Document Released:  08/02/2011 Document Revised: 05/31/2014 Document Reviewed: 08/02/2011 Elsevier Interactive Patient Education [employer].

## 2015-11-13 ENCOUNTER — Telehealth: Payer: Self-pay | Admitting: Internal Medicine

## 2015-11-13 NOTE — Telephone Encounter (Signed)
Agree with recommendation

## 2015-11-13 NOTE — Telephone Encounter (Signed)
Patient Name: Nicholas Rasmussen  DOB: 1946-05-08    Initial Comment Caller states had a boil lanced yesterday, still bleeding a little bit.   Nurse Assessment  Nurse: Raphael Gibney, RN, Vanita Ingles Date/Time (Eastern Time): 11/13/2015 9:27:53 AM  Confirm and document reason for call. If symptomatic, describe symptoms. You must click the next button to save text entered. ---Caller states he had a boil lanced yesterday. He is taking an antibiotic. Still bleeding a little bit. No fever. No pain.  Has the patient traveled out of the country within the last 30 days? ---Not Applicable  Does the patient have any new or worsening symptoms? ---Yes  Will a triage be completed? ---Yes  Related visit to physician within the last 2 weeks? ---Yes  Does the PT have any chronic conditions? (i.e. diabetes, asthma, etc.) ---No  Is this a behavioral health or substance abuse call? ---No     Guidelines    Guideline Title Affirmed Question Affirmed Notes  Cellulitis on Antibiotic Follow-up Call [1] Taking antibiotic AND [2] cellulitis symptoms are BETTER AND [3] no fever    Final Disposition User   [facility], RN, Vanita Ingles    Disagree/Comply: Comply

## 2015-11-14 NOTE — Telephone Encounter (Signed)
I spoke with patient. He is going to call back to give me details about the [facility] screening, so I can inform Dr. Sharlet Salina.

## 2015-11-15 LAB — WOUND CULTURE
Gram Stain: NONE SEEN
Gram Stain: NONE SEEN

## 2015-11-17 ENCOUNTER — Telehealth: Payer: Self-pay | Admitting: Internal Medicine

## 2015-11-17 NOTE — Telephone Encounter (Signed)
Patient states he received forms for [facility] screening in the mail.  Patient states he does not have access to drop the forms off, to fax the forms over or to mail the forms over for Dr. Quay Burow to look at but he would like to know if this is something he can do.  They do different screens for heart diseases.

## 2015-11-18 NOTE — Telephone Encounter (Signed)
[facility] does different tests to check for heart disease. Patient wants to know if this is something he should do? Please advise, thanks.

## 2015-11-18 NOTE — Telephone Encounter (Signed)
As previously stated I cannot tell if the tests are helpful, if they have already been done with [place] unless we look at what tests they intend to do. This would require the actual information. We cannot give advice without knowing what we are giving advice about.

## 2015-11-19 NOTE — Telephone Encounter (Signed)
Patient brought in the information about the [facility] screening. I put it in your folder for review.

## 2015-11-20 NOTE — Telephone Encounter (Signed)
Upon review the abdominal aortic aneurysm screening not needed, EKG not needed (heart rhythm screening for Atrial fibrillation) because they have been done already. The other tests are not harmful to have done and would be his option if he wants to do.

## 2015-11-21 NOTE — Telephone Encounter (Signed)
Patient aware.

## 2015-11-27 NOTE — Progress Notes (Signed)
Subjective:    Patient ID: Nicholas Rasmussen, male    DOB: 17-Oct-1945, 70 y.o.   MRN: [id number]   Nicholas Rasmussen is a 70 y.o. male who presents today for an acute visit.    HPI Comments: Patient here for evaluation of boil under left arm for past week, improving with home remedy with onion. It started this drain on its own. No history of MRSA. No fever, chills.    Chief Complaint  Patient presents with  . Recurrent Skin Infections    Patient stated that there is a boil under the left arm. Patient stated that it started draining three days ago after using home remedy (onion).      Past Medical History  Diagnosis Date  . Seizures ([place])   . GERD (gastroesophageal reflux disease)    Allergies: Review of patient's allergies indicates no known allergies. Current Outpatient Prescriptions on File Prior to Visit  Medication Sig Dispense Refill  . aspirin 81 MG tablet Take 81 mg by mouth daily.    . divalproex (DEPAKOTE) 250 MG DR tablet TAKE 1 TABLET (250 MG TOTAL) BY MOUTH 2 (TWO) TIMES DAILY. 60 tablet 6  . ranitidine (ZANTAC) 150 MG tablet TAKE 1 TABLET (150 MG TOTAL) BY MOUTH 2 (TWO) TIMES DAILY. 60 tablet 5  . acyclovir (ZOVIRAX) 200 MG capsule Take 1 capsule (200 mg total) by mouth 5 (five) times daily. (Patient not taking: Reported on 11/12/2015) 60 capsule 6   No current facility-administered medications on file prior to visit.    Social History  Substance Use Topics  . Smoking status: Former Research scientist (life sciences)  . Smokeless tobacco: Not on file  . Alcohol Use: No    Review of Systems  Constitutional: Negative for fever and chills.  Respiratory: Negative for cough.   Cardiovascular: Negative for chest pain and palpitations.  Gastrointestinal: Negative for nausea and vomiting.  Skin: Positive for wound. Negative for rash.      Objective:    BP 118/70 mmHg  Pulse 58  Temp(Src) 97.8 F (36.6 C) (Oral)  Ht 6\' 4"  (1.93 m)  Wt 259 lb 8 oz (117.708 kg)  BMI 31.60 kg/m2  SpO2  97%   Physical Exam  Constitutional: He appears well-developed and well-nourished.  Cardiovascular: Regular rhythm and normal heart sounds.   Pulmonary/Chest: Effort normal and breath sounds normal. No respiratory distress. He has no wheezes. He has no rhonchi. He has no rales.  Lymphadenopathy:       Head (left side): No submandibular and no preauricular adenopathy present.  Neurological: He is alert.  Skin: Skin is warm and dry.  Psychiatric: He has a normal mood and affect. His speech is normal and behavior is normal.  Vitals reviewed.   Patient does not have previous history of MRSA.  Patient does not have diabetes.   The patient gave informed consent for the procedure. Patient and I discussed risks, benefits, and alternatives to I & D.  Including risk of infection from laceration, localized pain, and bleeding. We discussed the option for oral antibiotics. Patient verbalized understanding to conversation and all questions were answered. We jointly decided to proceed with procedure.  On exam, there is a 6 cm indurated abscess, spontaneously draining purulent yellow discharge.  It is tender to palpation.  There is erythema.  The patient gave informed consent for the procedure. The area was prepped with antiseptic solution. The area was anesthetized using lidocaine. The patient tolerated the anesthetic well.    #11 blade was used  to incise the abscess.  Purulent bloody, and sebaceous drainage was noted.  Bleeding from the wound was controlled by applying direct pressure with gauze.   Iodoform was used to pack the abscess.  A bandage was placed.  Wound culture was obtained.  The patient tolerated the procedure well.  Extensive instructions were given to the patient.   Complications: None  Take antibiotic as directed.  Follow-up for wound check in 2-3 days.     Assessment & Plan:   1. Abscess of axilla Well tolerated I & D. Empiric coverage with oral antibiotics.   - WOUND  CULTURE; Future - mupirocin cream (BACTROBAN) 2 %; Apply 1 application topically 3 (three) times daily.  Dispense: 15 g; Refill: 0 - cephALEXin (KEFLEX) 500 MG capsule; Take 1 capsule (500 mg total) by mouth 4 (four) times daily.  Dispense: 28 capsule; Refill: 0    I am having Mr. Grandville Silos maintain his aspirin, divalproex, ranitidine, and acyclovir.   No orders of the defined types were placed in this encounter.     Start medications as prescribed and explained to patient on After Visit Summary ( AVS). Risks, benefits, and alternatives of the medications and treatment plan prescribed today were discussed, and patient expressed understanding.   Education regarding symptom management and diagnosis given to patient.   Follow-up:Plan follow-up and return precautions given if any worsening symptoms or change in condition.   Continue to follow with [name], MD for routine health maintenance.   [name] and I agreed with plan.   Mable Paris, FNP

## 2016-01-02 ENCOUNTER — Ambulatory Visit (INDEPENDENT_AMBULATORY_CARE_PROVIDER_SITE_OTHER)
Admission: RE | Admit: 2016-01-02 | Discharge: 2016-01-02 | Disposition: A | Discharge status: Home / Self Care | Payer: PPO | Source: Ambulatory Visit | Attending: Internal Medicine | Admitting: Internal Medicine

## 2016-01-02 ENCOUNTER — Ambulatory Visit (INDEPENDENT_AMBULATORY_CARE_PROVIDER_SITE_OTHER): Payer: PPO | Admitting: Internal Medicine

## 2016-01-02 ENCOUNTER — Other Ambulatory Visit: Payer: Self-pay | Admitting: Internal Medicine

## 2016-01-02 ENCOUNTER — Encounter: Payer: Self-pay | Admitting: Internal Medicine

## 2016-01-02 VITALS — BP 138/78 | HR 54 | Temp 98.5°F | Resp 14 | Ht 76.0 in | Wt 251.0 lb

## 2016-01-02 DIAGNOSIS — R42 Dizziness and giddiness: Secondary | ICD-10-CM

## 2016-01-02 DIAGNOSIS — R109 Unspecified abdominal pain: Secondary | ICD-10-CM | POA: Insufficient documentation

## 2016-01-02 DIAGNOSIS — R0781 Pleurodynia: Secondary | ICD-10-CM

## 2016-01-02 DIAGNOSIS — R221 Localized swelling, mass and lump, neck: Secondary | ICD-10-CM | POA: Diagnosis not present

## 2016-01-02 HISTORY — DX: Unspecified abdominal pain: R10.9

## 2016-01-02 HISTORY — DX: Dizziness and giddiness: R42

## 2016-01-02 NOTE — Progress Notes (Signed)
   Subjective:    Patient ID: Nicholas Rasmussen, male    DOB: Oct 04, 1945, 70 y.o.   MRN: [id number]  HPI The patient is a 70 YO man coming in for follow up of his left side pain (going on for some time, [place] abd not revealing, hx of old injury to that side 10+ years ago, he has noticed some difference in the rib cage size from left to right) and new dizziness (when ballroom dancing, has not tried anything for it, no history of that, some ear discomfort).   Review of Systems  Constitutional: Negative for activity change, appetite change, fatigue, fever and unexpected weight change.  HENT: Negative for congestion, ear discharge, ear pain, postnasal drip, rhinorrhea, sinus pressure and sore throat.   Eyes: Negative for photophobia, pain, discharge, itching and visual disturbance.  Respiratory: Negative for cough, chest tightness, shortness of breath and wheezing.   Cardiovascular: Negative for chest pain, palpitations and leg swelling.  Gastrointestinal: Negative for abdominal distention, abdominal pain, constipation, diarrhea and nausea.  Musculoskeletal: Positive for arthralgias. Negative for back pain, gait problem, joint swelling and myalgias.  Skin: Negative.   Neurological: Positive for dizziness. Negative for tremors, seizures, weakness, light-headedness and numbness.      Objective:   Physical Exam  Constitutional: He is oriented to person, place, and time. He appears well-developed and well-nourished.  HENT:  Head: Normocephalic and atraumatic.  Left Ear: External ear normal.  Right ear obstructed with wax, TM normal bilaterally after irrigation.   Eyes: EOM are normal.  Neck: Normal range of motion.  Cardiovascular: Normal rate and regular rhythm.   No murmur heard. Pulmonary/Chest: Effort normal and breath sounds normal. No respiratory distress. He has no wheezes. He has no rales.  Some asymmetry from right and left rib cage  Abdominal: Soft. Bowel sounds are normal. He exhibits no  distension. There is no tenderness. There is no rebound.  Musculoskeletal: He exhibits no edema.  Neurological: He is alert and oriented to person, place, and time. Coordination normal.  Skin: Skin is warm and dry.   Vitals:   01/02/16 1118  BP: 138/78  Pulse: (!) 54  Resp: 14  Temp: 98.5 F (36.9 C)  TempSrc: Oral  SpO2: 98%  Weight: 251 lb (113.9 kg)  Height: 6\' 4"  (1.93 m)      Assessment & Plan:

## 2016-01-02 NOTE — Assessment & Plan Note (Signed)
Ordered x-ray right and left for the asymmetry noted on exam.

## 2016-01-02 NOTE — Assessment & Plan Note (Signed)
Irrigation done of the right ear to see if this helps with his symptoms.

## 2016-01-02 NOTE — Patient Instructions (Signed)
We are checking the x-ray of the ribs today and have cleaned out the ear for you to see if this helps with the dizziness.

## 2016-01-02 NOTE — Progress Notes (Signed)
Pre visit review using our clinic review tool, if applicable. No additional management support is needed unless otherwise documented below in the visit note. 

## 2016-01-26 ENCOUNTER — Other Ambulatory Visit: Payer: Self-pay | Admitting: Internal Medicine

## 2016-02-03 ENCOUNTER — Ambulatory Visit (INDEPENDENT_AMBULATORY_CARE_PROVIDER_SITE_OTHER): Payer: PPO

## 2016-02-03 DIAGNOSIS — Z23 Encounter for immunization: Secondary | ICD-10-CM

## 2016-04-11 ENCOUNTER — Other Ambulatory Visit: Payer: Self-pay | Admitting: Internal Medicine

## 2016-04-12 ENCOUNTER — Encounter: Payer: Self-pay | Admitting: Internal Medicine

## 2016-04-12 ENCOUNTER — Other Ambulatory Visit (INDEPENDENT_AMBULATORY_CARE_PROVIDER_SITE_OTHER): Payer: PPO

## 2016-04-12 ENCOUNTER — Ambulatory Visit (INDEPENDENT_AMBULATORY_CARE_PROVIDER_SITE_OTHER): Payer: PPO | Admitting: Internal Medicine

## 2016-04-12 VITALS — BP 114/72 | HR 59 | Temp 98.4°F | Resp 12 | Ht 76.0 in | Wt 248.4 lb

## 2016-04-12 DIAGNOSIS — R109 Unspecified abdominal pain: Secondary | ICD-10-CM

## 2016-04-12 LAB — COMPREHENSIVE METABOLIC PANEL
ALT: 7 U/L (ref 0–53)
AST: 16 U/L (ref 0–37)
Albumin: 4.3 g/dL (ref 3.5–5.2)
Alkaline Phosphatase: 57 U/L (ref 39–117)
BUN: 16 mg/dL (ref 6–23)
CO2: 30 mEq/L (ref 19–32)
Calcium: 9.3 mg/dL (ref 8.4–10.5)
Chloride: 102 mEq/L (ref 96–112)
Creatinine, Ser: 1.08 mg/dL (ref 0.40–1.50)
GFR: 87.43 mL/min (ref >60.00)
Glucose, Bld: 96 mg/dL (ref 70–99)
Potassium: 3.9 mEq/L (ref 3.5–5.1)
Sodium: 138 mEq/L (ref 135–145)
Total Bilirubin: 0.3 mg/dL (ref 0.2–1.2)
Total Protein: 7.8 g/dL (ref 6.0–8.3)

## 2016-04-12 LAB — HEMOGLOBIN A1C: Hgb A1c MFr Bld: 6.2 % (ref 4.6–6.5)

## 2016-04-12 NOTE — Progress Notes (Signed)
Pre visit review using our clinic review tool, if applicable. No additional management support is needed unless otherwise documented below in the visit note. 

## 2016-04-12 NOTE — Patient Instructions (Signed)
We are going to check the blood work today and check a CT scan of the stomach to make sure there are no problems causing the pain.

## 2016-04-13 NOTE — Progress Notes (Signed)
   Subjective:    Patient ID: Nicholas Rasmussen, male    DOB: 1945/09/20, 70 y.o.   MRN: [id number]  HPI The patient is a 70 YO man coming in for left flank pain for about 1-2 days. He has had episodes of this off and on. Woke up with symptoms and then bothered him with breathing and all day. Slightly better today. Took OTC pain meds which were effective. He is very worried about what this could be which could be coming back. He denies excessive activity or lifting prior to onset. No fevers or chills. No nausea or vomiting. No diarrhea or constipation. Now does not hurt to breathe. No long trips recently.   Review of Systems  Constitutional: Negative.   Respiratory: Negative.   Cardiovascular: Positive for chest pain. Negative for palpitations and leg swelling.  Gastrointestinal: Positive for abdominal pain. Negative for abdominal distention, constipation, diarrhea, nausea and vomiting.  Musculoskeletal: Positive for myalgias. Negative for arthralgias, back pain and gait problem.  Skin: Negative.   Neurological: Negative.       Objective:   Physical Exam  Constitutional: He is oriented to person, place, and time. He appears well-developed and well-nourished.  HENT:  Head: Normocephalic and atraumatic.  Eyes: EOM are normal.  Neck: Normal range of motion.  Cardiovascular: Normal rate and regular rhythm.   Pulmonary/Chest: Effort normal and breath sounds normal. No respiratory distress. He has no wheezes. He exhibits no tenderness.  Abdominal: Soft. Bowel sounds are normal. He exhibits no distension and no mass. There is tenderness. There is no rebound and no guarding.  Some left flank tenderness.   Neurological: He is alert and oriented to person, place, and time.  Skin: Skin is warm and dry.   Vitals:   04/12/16 1608  BP: 114/72  Pulse: (!) 59  Resp: 12  Temp: 98.4 F (36.9 C)  TempSrc: Oral  SpO2: 98%  Weight: 248 lb 6.4 oz (112.7 kg)  Height: 6\' 4"  (1.93 m)      Assessment  & Plan:

## 2016-04-13 NOTE — Assessment & Plan Note (Signed)
Ordering CT abdomen with contrast as none prior. He has now had similar symptoms 3-4 episodes without cause.

## 2016-04-21 ENCOUNTER — Encounter (HOSPITAL_COMMUNITY): Payer: Self-pay | Admitting: Nurse Practitioner

## 2016-04-21 ENCOUNTER — Emergency Department (HOSPITAL_COMMUNITY): Payer: PPO

## 2016-04-21 ENCOUNTER — Emergency Department (HOSPITAL_COMMUNITY)
Admission: EM | Admit: 2016-04-21 | Discharge: 2016-04-21 | Disposition: A | Discharge status: Home / Self Care | Payer: PPO | Attending: Emergency Medicine | Admitting: Emergency Medicine

## 2016-04-21 DIAGNOSIS — Z87891 Personal history of nicotine dependence: Secondary | ICD-10-CM | POA: Insufficient documentation

## 2016-04-21 DIAGNOSIS — H6502 Acute serous otitis media, left ear: Secondary | ICD-10-CM | POA: Insufficient documentation

## 2016-04-21 DIAGNOSIS — R42 Dizziness and giddiness: Secondary | ICD-10-CM | POA: Diagnosis not present

## 2016-04-21 DIAGNOSIS — H65192 Other acute nonsuppurative otitis media, left ear: Secondary | ICD-10-CM | POA: Diagnosis not present

## 2016-04-21 DIAGNOSIS — R404 Transient alteration of awareness: Secondary | ICD-10-CM | POA: Diagnosis not present

## 2016-04-21 LAB — URINALYSIS, ROUTINE W REFLEX MICROSCOPIC
Bilirubin Urine: NEGATIVE
Glucose, UA: NEGATIVE mg/dL
Hgb urine dipstick: NEGATIVE
Ketones, ur: NEGATIVE mg/dL
Leukocytes, UA: NEGATIVE
Nitrite: NEGATIVE
Protein, ur: NEGATIVE mg/dL
Specific Gravity, Urine: 1.023 (ref 1.005–1.030)
pH: 6.5 (ref 5.0–8.0)

## 2016-04-21 LAB — CBC WITH DIFFERENTIAL/PLATELET
Basophils Absolute: 0 10*3/uL (ref 0.0–0.1)
Basophils Relative: 0 %
Eosinophils Absolute: 0 10*3/uL (ref 0.0–0.7)
Eosinophils Relative: 1 %
HCT: 39.2 % (ref 39.0–52.0)
Hemoglobin: 12.5 g/dL — ABNORMAL LOW (ref 13.0–17.0)
Lymphocytes Relative: 28 %
Lymphs Abs: 1.4 10*3/uL (ref 0.7–4.0)
MCH: 26.6 pg (ref 26.0–34.0)
MCHC: 31.9 g/dL (ref 30.0–36.0)
MCV: 83.4 fL (ref 78.0–100.0)
Monocytes Absolute: 0.5 10*3/uL (ref 0.1–1.0)
Monocytes Relative: 11 %
Neutro Abs: 3 10*3/uL (ref 1.7–7.7)
Neutrophils Relative %: 60 %
Platelets: 183 10*3/uL (ref 150–400)
RBC: 4.7 MIL/uL (ref 4.22–5.81)
RDW: 14.4 % (ref 11.5–15.5)
WBC: 5 10*3/uL (ref 4.0–10.5)

## 2016-04-21 LAB — COMPREHENSIVE METABOLIC PANEL
ALT: 10 U/L — ABNORMAL LOW (ref 17–63)
AST: 21 U/L (ref 15–41)
Albumin: 3.8 g/dL (ref 3.5–5.0)
Alkaline Phosphatase: 43 U/L (ref 38–126)
Anion gap: 5 (ref 5–15)
BUN: 14 mg/dL (ref 6–20)
CO2: 27 mmol/L (ref 22–32)
Calcium: 8.4 mg/dL — ABNORMAL LOW (ref 8.9–10.3)
Chloride: 107 mmol/L (ref 101–111)
Creatinine, Ser: 1.14 mg/dL (ref 0.61–1.24)
GFR calc Af Amer: >60 mL/min (ref >60)
GFR calc non Af Amer: >60 mL/min (ref >60)
Glucose, Bld: 100 mg/dL — ABNORMAL HIGH (ref 65–99)
Potassium: 3.8 mmol/L (ref 3.5–5.1)
Sodium: 139 mmol/L (ref 135–145)
Total Bilirubin: 0.7 mg/dL (ref 0.3–1.2)
Total Protein: 7.1 g/dL (ref 6.5–8.1)

## 2016-04-21 MED ORDER — MECLIZINE HCL 25 MG PO TABS: 25.0000 mg | ORAL_TABLET | Freq: Three times a day (TID) | ORAL | 0 refills | Status: DC | PRN

## 2016-04-21 MED ORDER — AMOXICILLIN 500 MG PO CAPS: 500.0000 mg | ORAL_CAPSULE | Freq: Three times a day (TID) | ORAL | 0 refills | Status: DC

## 2016-04-21 NOTE — ED Provider Notes (Signed)
[facility] DEPT Provider Note   CSN: [id number] Arrival date & time: 04/21/16  1017     History   Chief Complaint No chief complaint on file.   HPI Nicholas Rasmussen is a 70 y.o. male.  The history is provided by the patient. No language interpreter was used.  Dizziness  Quality:  Lightheadedness and room spinning Severity:  Moderate Onset quality:  Gradual Duration:  7 hours Timing:  Constant Progression:  Worsening Chronicity:  New Context: bending over and head movement   Relieved by:  Change in position Worsened by:  Nothing Ineffective treatments:  None tried Associated symptoms: nausea   Associated symptoms: no hearing loss   Risk factors: hx of vertigo   Risk factors: no anemia    Pt reports he has had similar episodes in the past.  Pt reports the last time he had. He had his ears cleaned out and he felt better.  Pt reports right ear and behind r ear feels uncomfortable.  Pt reports he has had similar episodes in the past.  Pt thinks he has vertigo  Past Medical History:  Diagnosis Date  . GERD (gastroesophageal reflux disease)   . Seizures [facility])     Patient Active Problem List   Diagnosis Date Noted  . Left flank pain 01/02/2016  . Dizziness 01/02/2016  . GERD (gastroesophageal reflux disease) 11/15/2014  . Seizure disorder ([place]) 05/07/2014  . Allergic rhinitis 05/07/2014  . Routine general medical examination at a health care facility 05/07/2014    Past Surgical History:  Procedure Laterality Date  . KNEE SURGERY     left miniscus       Home Medications    Prior to Admission medications   Medication Sig Start Date End Date Taking? Authorizing Provider  aspirin 81 MG tablet Take 81 mg by mouth daily.   Yes Historical Provider, MD  divalproex (DEPAKOTE) 250 MG DR tablet TAKE 1 TABLET (250 MG TOTAL) BY MOUTH 2 (TWO) TIMES DAILY. 01/27/16  Yes [name], MD  amoxicillin (AMOXIL) 500 MG capsule Take 1 capsule (500 mg total) by mouth 3  (three) times daily. 04/21/16   Fransico Meadow, PA-C  meclizine (ANTIVERT) 25 MG tablet Take 1 tablet (25 mg total) by mouth 3 (three) times daily as needed for dizziness. 04/21/16   Fransico Meadow, PA-C  mupirocin cream (BACTROBAN) 2 % Apply 1 application topically 3 (three) times daily. Patient not taking: Reported on 04/21/2016 11/12/15   Burnard Hawthorne, FNP  ranitidine (ZANTAC) 150 MG tablet TAKE 1 TABLET (150 MG TOTAL) BY MOUTH 2 (TWO) TIMES DAILY. Patient not taking: Reported on 04/21/2016 04/12/16   [name], MD    Family History Family History  Problem Relation Age of Onset  . Diabetes Father   . Hypertension Father     Social History Social History  Substance Use Topics  . Smoking status: Former Research scientist (life sciences)  . Smokeless tobacco: Never Used  . Alcohol use No     Allergies   Patient has no known allergies.   Review of Systems Review of Systems  HENT: Negative for hearing loss.   Gastrointestinal: Positive for nausea.  Neurological: Positive for dizziness.  All other systems reviewed and are negative.    Physical Exam Updated Vital Signs BP 135/77   Pulse (!) 52   Temp 98 F (36.7 C) (Oral)   Resp 13   Ht 6\' 4"  (1.93 m)   Wt 112.5 kg   SpO2 99%  BMI 30.19 kg/m   Physical Exam  Constitutional: He is oriented to person, place, and time. He appears well-developed and well-nourished.  HENT:  Head: Normocephalic and atraumatic.  Left Ear: External ear normal.  Mouth/Throat: Oropharynx is clear and moist.  Right tm retracted, dark,   Eyes: Conjunctivae are normal.  Neck: Neck supple.  Cardiovascular: Normal rate and regular rhythm.   No murmur heard. Pulmonary/Chest: Effort normal and breath sounds normal. No respiratory distress.  Abdominal: Soft. There is no tenderness.  Musculoskeletal: He exhibits no edema.  Neurological: He is alert and oriented to person, place, and time. He displays normal reflexes. No cranial nerve deficit or sensory  deficit. Coordination normal.  Skin: Skin is warm and dry.  Psychiatric: He has a normal mood and affect.  Nursing note and vitals reviewed.  Pt has increased symptoms with turning his head to the left.    ED Treatments / Results  Labs (all labs ordered are listed, but only abnormal results are displayed) Labs Reviewed  CBC WITH DIFFERENTIAL/PLATELET - Abnormal; Notable for the following:       Result Value   Hemoglobin 12.5 (*)    All other components within normal limits  COMPREHENSIVE METABOLIC PANEL - Abnormal; Notable for the following:    Glucose, Bld 100 (*)    Calcium 8.4 (*)    ALT 10 (*)    All other components within normal limits  URINALYSIS, ROUTINE W REFLEX MICROSCOPIC (NOT AT [facility])    EKG  EKG Interpretation None       Radiology Ct Head Wo Contrast  Result Date: 04/21/2016 CLINICAL DATA:  Dizziness EXAM: CT HEAD WITHOUT CONTRAST TECHNIQUE: Contiguous axial images were obtained from the base of the skull through the vertex without intravenous contrast. COMPARISON:  None. FINDINGS: Brain: No evidence of acute infarction, hemorrhage, hydrocephalus, extra-axial collection or mass lesion/mass effect. Vascular: No hyperdense vessel or unexpected calcification. Skull: Normal. Negative for fracture or focal lesion. Sinuses/Orbits: No acute finding. Other: None. IMPRESSION: No acute intracranial abnormality. Electronically Signed   By: Inez Catalina M.D.   On: 04/21/2016 14:07    Procedures Procedures (including critical care time)  Medications Ordered in ED Medications - No data to display   Initial Impression / Assessment and Plan / ED Course  I have reviewed the triage vital signs and the nursing notes.  Pertinent labs & imaging results that were available during my care of the patient were reviewed by me and considered in my medical decision making (see chart for details).  Clinical Course     Pt may have an ear infection that are causing his symptoms.  Pt  has possible vertigo.   I will cover him with amoxicillian and given antivert to help symptoms.  Pt has no stroke like symptoms.    Final Clinical Impressions(s) / ED Diagnoses   Final diagnoses:  Acute serous otitis media of left ear, recurrence not specified  Vertigo    New Prescriptions New Prescriptions   AMOXICILLIN (AMOXIL) 500 MG CAPSULE    Take 1 capsule (500 mg total) by mouth 3 (three) times daily.   MECLIZINE (ANTIVERT) 25 MG TABLET    Take 1 tablet (25 mg total) by mouth 3 (three) times daily as needed for dizziness.  An After Visit Summary was printed and given to the patient.   Hollace Kinnier [place], PA-C 04/21/16 [place], MD 04/21/16 [phone]

## 2016-04-21 NOTE — Discharge Instructions (Signed)
See your Physician for recheck in 1 week.  Return if any problems. °

## 2016-04-21 NOTE — ED Triage Notes (Signed)
Woke up this morning and was so dizzy he couldn't get out of bed. Tried to lay there another hour to see if it would subside and it didn't so he called EMS. He has a history of seizures but has not had one since highschool.

## 2016-04-21 NOTE — ED Notes (Addendum)
Patient feels dizzy. Room spins. No nausea.

## 2016-04-21 NOTE — ED Notes (Signed)
Discharge instructions, follow up care, and rx x2 reviewed with patient. Patient verbalized understanding. 

## 2016-04-27 ENCOUNTER — Ambulatory Visit
Admission: RE | Admit: 2016-04-27 | Discharge: 2016-04-27 | Disposition: A | Discharge status: Home / Self Care | Payer: PPO | Source: Ambulatory Visit | Attending: Internal Medicine | Admitting: Internal Medicine

## 2016-04-27 DIAGNOSIS — R109 Unspecified abdominal pain: Secondary | ICD-10-CM

## 2016-04-27 DIAGNOSIS — N281 Cyst of kidney, acquired: Secondary | ICD-10-CM | POA: Diagnosis not present

## 2016-04-27 MED ORDER — IOPAMIDOL (ISOVUE-300) INJECTION 61%
100.0000 mL | Freq: Once | INTRAVENOUS | Status: AC | PRN
  Administered 2016-04-27: 100 mL via INTRAVENOUS

## 2016-04-28 ENCOUNTER — Ambulatory Visit (INDEPENDENT_AMBULATORY_CARE_PROVIDER_SITE_OTHER): Payer: PPO | Admitting: Internal Medicine

## 2016-04-28 ENCOUNTER — Encounter: Payer: Self-pay | Admitting: Internal Medicine

## 2016-04-28 DIAGNOSIS — J302 Other seasonal allergic rhinitis: Secondary | ICD-10-CM

## 2016-04-28 DIAGNOSIS — R42 Dizziness and giddiness: Secondary | ICD-10-CM | POA: Diagnosis not present

## 2016-04-28 NOTE — Assessment & Plan Note (Signed)
Ear infection resolved on exam. He will finish up the amoxicillin and stop using meclizine. Given epley maneuver to do to get rid of the last of the vertigo.

## 2016-04-28 NOTE — Progress Notes (Signed)
Pre visit review using our clinic review tool, if applicable. No additional management support is needed unless otherwise documented below in the visit note. 

## 2016-04-28 NOTE — Patient Instructions (Signed)
It is okay to stop the amoxicillin and meclizine.   We have given you some exercises to help with the dizziness. Do these twice a day for the next 1-2 weeks and if you are still having problems send [place] a message or call us to see the ENT.

## 2016-04-28 NOTE — Assessment & Plan Note (Signed)
Advised to use zyrtec or flonase to help with his drainage which may help with the ears as well.

## 2016-04-28 NOTE — Progress Notes (Signed)
   Subjective:    Patient ID: Nicholas Rasmussen, male    DOB: 05/21/1946, 70 y.o.   MRN: [id number]  HPI The patient is a 70 YO man coming in for ER follow up. He was having some dizziness and ear infection. They have him some amoxicillin and meclizine which he took. He is no longer having any ear pain. He is still taking the amoxicillin for another 3-4 days. No major side effects. He does still get some dizziness if he turns his head too quick. He tried the meclizine but it did not help much.   Review of Systems  Constitutional: Negative for activity change, appetite change, fatigue, fever and unexpected weight change.  HENT: Positive for rhinorrhea. Negative for congestion, ear discharge, ear pain, postnasal drip, sinus pain, sinus pressure, sore throat and trouble swallowing.   Respiratory: Negative.   Cardiovascular: Negative.   Gastrointestinal: Negative.   Neurological: Positive for dizziness. Negative for tremors, weakness, light-headedness and headaches.      Objective:   Physical Exam  Constitutional: He appears well-developed and well-nourished.  HENT:  Head: Normocephalic and atraumatic.  Right Ear: External ear normal.  Left Ear: External ear normal.  Nose: Nose normal.  Oropharynx with redness and erythema and clear drainage.   Eyes: EOM are normal. Pupils are equal, round, and reactive to light.  Neck: Normal range of motion. No JVD present.  Cardiovascular: Normal rate and regular rhythm.   Pulmonary/Chest: Effort normal. No respiratory distress. He has no wheezes. He has no rales.  Abdominal: Soft.  Lymphadenopathy:    He has no cervical adenopathy.  Skin: Skin is warm and dry.   Vitals:   04/28/16 1033  BP: 130/60  Pulse: 63  Resp: 14  Temp: 98.1 F (36.7 C)  TempSrc: Oral  SpO2: 98%  Weight: 253 lb (114.8 kg)  Height: 6\' 4"  (1.93 m)      Assessment & Plan:

## 2016-04-29 ENCOUNTER — Ambulatory Visit: Payer: PPO | Admitting: Internal Medicine

## 2016-05-10 ENCOUNTER — Telehealth: Payer: Self-pay | Admitting: Internal Medicine

## 2016-05-10 DIAGNOSIS — H8113 Benign paroxysmal vertigo, bilateral: Secondary | ICD-10-CM

## 2016-05-10 NOTE — Telephone Encounter (Signed)
Referral placed.

## 2016-05-10 NOTE — Telephone Encounter (Signed)
Patient is still getting dizzy when he lays down or turns to his right. You said for him to call back in a week or two if he is not feeling better and you would send him to ENT. He is asking to be referred. Please place order, thanks.

## 2016-05-10 NOTE — Telephone Encounter (Signed)
Pt called in and said that he thought he was going to be referred to an ENT.  Was he suppose to be?

## 2016-06-08 DIAGNOSIS — H906 Mixed conductive and sensorineural hearing loss, bilateral: Secondary | ICD-10-CM | POA: Diagnosis not present

## 2016-06-08 DIAGNOSIS — R42 Dizziness and giddiness: Secondary | ICD-10-CM | POA: Diagnosis not present

## 2016-06-25 ENCOUNTER — Other Ambulatory Visit: Payer: Self-pay | Admitting: Internal Medicine

## 2016-07-13 ENCOUNTER — Ambulatory Visit (INDEPENDENT_AMBULATORY_CARE_PROVIDER_SITE_OTHER): Payer: PPO | Admitting: Internal Medicine

## 2016-07-13 ENCOUNTER — Encounter: Payer: Self-pay | Admitting: Internal Medicine

## 2016-07-13 DIAGNOSIS — G44209 Tension-type headache, unspecified, not intractable: Secondary | ICD-10-CM

## 2016-07-13 MED ORDER — BUTALBITAL-APAP-CAFFEINE 50-325-40 MG PO TABS: 1.0000 | ORAL_TABLET | Freq: Four times a day (QID) | ORAL | 0 refills | Status: DC | PRN

## 2016-07-13 NOTE — Progress Notes (Signed)
Pre visit review using our clinic review tool, if applicable. No additional management support is needed unless otherwise documented below in the visit note. 

## 2016-07-13 NOTE — Progress Notes (Signed)
   Subjective:    Patient ID: Nicholas Rasmussen, male    DOB: 03/06/1946, 71 y.o.   MRN: [id number]  HPI The patient is a 71 YO man coming in for headaches. He is having them for about 2 weeks now. They are mostly above or behind the right eye. He is having mild sinus drainage but no pressure. No cough or congestion. Advil is helping some with the headaches. They are not present in the morning and sometimes build throughout the day or they just show up. Denies pain in his temples. No fevers or chills. No light or sound sensitivity during the headache. He denies neurological symptoms during headache. No stroke like symptoms. Not having dizziness now.   Review of Systems  Constitutional: Negative.   Respiratory: Negative.   Cardiovascular: Negative.   Gastrointestinal: Negative.   Musculoskeletal: Positive for gait problem. Negative for arthralgias, back pain, joint swelling, myalgias and neck pain.  Skin: Negative.   Neurological: Positive for headaches. Negative for dizziness, seizures, syncope, facial asymmetry, speech difficulty and weakness.      Objective:   Physical Exam  Constitutional: He is oriented to person, place, and time. He appears well-developed and well-nourished.  HENT:  Head: Normocephalic and atraumatic.  Right Ear: External ear normal.  Left Ear: External ear normal.  Nose: Nose normal.  Mouth/Throat: Oropharynx is clear and moist.  Eyes: Conjunctivae and EOM are normal. Pupils are equal, round, and reactive to light.  Neck: Normal range of motion. No JVD present.  Cardiovascular: Normal rate and regular rhythm.   Pulmonary/Chest: Effort normal and breath sounds normal. No respiratory distress. He has no wheezes. He has no rales.  Abdominal: Soft. He exhibits no distension. There is no tenderness. There is no rebound.  Lymphadenopathy:    He has no cervical adenopathy.  Neurological: He is alert and oriented to person, place, and time.  Stable gait from prior    Skin: Skin is warm and dry.  Psychiatric: He has a normal mood and affect.   Vitals:   07/13/16 1510  BP: (!) 142/86  Pulse: (!) 53  Temp: 98.2 F (36.8 C)  TempSrc: Oral  SpO2: 99%  Weight: 247 lb (112 kg)  Height: 6\' 4"  (1.93 m)      Assessment & Plan:

## 2016-07-13 NOTE — Patient Instructions (Signed)
We have given you the headache medicine you can take for pain.   It is okay to take the xyzal you have at home.

## 2016-07-14 ENCOUNTER — Telehealth: Payer: Self-pay | Admitting: Internal Medicine

## 2016-07-14 NOTE — Assessment & Plan Note (Addendum)
Given rx for fioricet today for the pain to help stop this headache cycle. No change in caffeine or diet. Recent imaging of the brain which was normal. No temporal tenderness to suggest temporal arteritis.

## 2016-07-14 NOTE — Telephone Encounter (Signed)
Pt called stating pharmacy sent something to our office about Fioricet (he is unsure what wrong with it). Please give him a call back or pharmacy to find out whats going on.

## 2016-07-15 ENCOUNTER — Encounter: Payer: Self-pay | Admitting: Internal Medicine

## 2016-07-15 ENCOUNTER — Telehealth: Payer: Self-pay | Admitting: Internal Medicine

## 2016-07-15 NOTE — Telephone Encounter (Signed)
Pt called stated insurance is not covering Fioricet. Please follow up with this, pharmacy is trying to contact our office for 2 days to get this med.   Please call pt

## 2016-07-16 ENCOUNTER — Telehealth: Payer: Self-pay | Admitting: Emergency Medicine

## 2016-07-16 ENCOUNTER — Telehealth: Payer: Self-pay | Admitting: Internal Medicine

## 2016-07-16 DIAGNOSIS — T07XXXA Unspecified multiple injuries, initial encounter: Secondary | ICD-10-CM | POA: Diagnosis not present

## 2016-07-16 MED ORDER — SUMATRIPTAN SUCCINATE 25 MG PO TABS: 25.0000 mg | ORAL_TABLET | ORAL | 0 refills | Status: DC | PRN

## 2016-07-16 NOTE — Telephone Encounter (Signed)
We have sent in sumatriptan to try instead. We will still work on the PA today. Call and let him know there is another medicine for him to try.

## 2016-07-16 NOTE — Telephone Encounter (Signed)
Pt came in and wants to hold off on the referral until he tries a medication. He is wondering if he can get a different medication so that he doesn't have to go all weekend without medication and waiting on a PA. Please advise thanks.

## 2016-07-16 NOTE — Telephone Encounter (Signed)
Called and got PA submitted, should know something in 72hours, patient aware

## 2016-07-16 NOTE — Telephone Encounter (Signed)
Patient contacted and stated awareness 

## 2016-07-16 NOTE — Telephone Encounter (Signed)
Pt is requesting a referral to neurology.

## 2016-07-17 ENCOUNTER — Emergency Department (HOSPITAL_COMMUNITY)
Admission: EM | Admit: 2016-07-17 | Discharge: 2016-07-17 | Disposition: A | Discharge status: Home / Self Care | Payer: PPO | Attending: Emergency Medicine | Admitting: Emergency Medicine

## 2016-07-17 ENCOUNTER — Encounter (HOSPITAL_COMMUNITY): Payer: Self-pay | Admitting: Emergency Medicine

## 2016-07-17 DIAGNOSIS — Z7982 Long term (current) use of aspirin: Secondary | ICD-10-CM | POA: Insufficient documentation

## 2016-07-17 DIAGNOSIS — B029 Zoster without complications: Secondary | ICD-10-CM | POA: Diagnosis not present

## 2016-07-17 DIAGNOSIS — R21 Rash and other nonspecific skin eruption: Secondary | ICD-10-CM | POA: Diagnosis not present

## 2016-07-17 DIAGNOSIS — Z87891 Personal history of nicotine dependence: Secondary | ICD-10-CM | POA: Insufficient documentation

## 2016-07-17 DIAGNOSIS — Z79899 Other long term (current) drug therapy: Secondary | ICD-10-CM | POA: Diagnosis not present

## 2016-07-17 MED ORDER — VALACYCLOVIR HCL 500 MG PO TABS
1000.0000 mg | ORAL_TABLET | Freq: Once | ORAL | Status: AC
  Administered 2016-07-17: 1000 mg via ORAL
  Filled 2016-07-17: qty 2

## 2016-07-17 MED ORDER — VALACYCLOVIR HCL 1 G PO TABS: 1000.0000 mg | ORAL_TABLET | Freq: Three times a day (TID) | ORAL | 0 refills | Status: AC

## 2016-07-17 MED ORDER — PREDNISONE 20 MG PO TABS
40.0000 mg | ORAL_TABLET | Freq: Once | ORAL | Status: AC
  Administered 2016-07-17: 40 mg via ORAL
  Filled 2016-07-17: qty 2

## 2016-07-17 MED ORDER — PREDNISONE 20 MG PO TABS: 40.0000 mg | ORAL_TABLET | Freq: Every day | ORAL | 0 refills | Status: AC

## 2016-07-17 NOTE — ED Triage Notes (Signed)
Per pt, states right head pain for over a week-broke out in rash this past Wednesday-rash on right brow, right top of head and behind right ear-not weeping

## 2016-07-17 NOTE — ED Provider Notes (Signed)
[facility] DEPT Provider Note    By signing my name below, I, Bea Graff, attest that this documentation has been prepared under the direction and in the presence of Shary Decamp, PA-C. Electronically Signed: Bea Graff, ED Scribe. 07/17/16. 3:18 PM.    History   Chief Complaint Chief Complaint  Patient presents with  . Headache    The history is provided by the patient and medical records. No language interpreter was used.    Nicholas Rasmussen is a 71 y.o. male who presents to the Emergency Department complaining of a rash to the right side of his face and head that began three days ago. He reports associated HA and head soreness that began about 5 days ago. He states he was seen by his PCP four days ago and was prescribed Imitex for pain with no significant relief. He notes that the rash had not appeared at the time of his PCP visit. Lying on the area increases the pain. He denies alleviating factors. He denies fever, chills, nausea, vomiting, visual changes. He denies h/o shingles. He denies h/o DM.  Past Medical History:  Diagnosis Date  . GERD (gastroesophageal reflux disease)   . Seizures [facility])     Patient Active Problem List   Diagnosis Date Noted  . Left flank pain 01/02/2016  . Dizziness 01/02/2016  . Headache 03/13/2015  . GERD (gastroesophageal reflux disease) 11/15/2014  . Seizure disorder ([place]) 05/07/2014  . Allergic rhinitis 05/07/2014  . Routine general medical examination at a health care facility 05/07/2014    Past Surgical History:  Procedure Laterality Date  . KNEE SURGERY     left miniscus     Home Medications    Prior to Admission medications   Medication Sig Start Date End Date Taking? Authorizing Provider  aspirin 81 MG tablet Take 81 mg by mouth daily.    Historical Provider, MD  butalbital-acetaminophen-caffeine (FIORICET, ESGIC) [phone] MG tablet Take 1 tablet by mouth every 6 (six) hours as needed for headache. 07/13/16    [name], MD  divalproex (DEPAKOTE) 250 MG DR tablet TAKE 1 TABLET (250 MG TOTAL) BY MOUTH 2 (TWO) TIMES DAILY. 06/25/16   [name], MD  meclizine (ANTIVERT) 25 MG tablet Take 1 tablet (25 mg total) by mouth 3 (three) times daily as needed for dizziness. Patient not taking: Reported on 07/13/2016 04/21/16   Fransico Meadow, PA-C  predniSONE (DELTASONE) 20 MG tablet Take 2 tablets (40 mg total) by mouth daily with breakfast. 07/17/16 07/22/16  Shary Decamp, PA-C  SUMAtriptan (IMITREX) 25 MG tablet Take 1 tablet (25 mg total) by mouth every 2 (two) hours as needed for migraine. May repeat in 2 hours if headache persists or recurs. 07/16/16   [name], MD  valACYclovir (VALTREX) 1000 MG tablet Take 1 tablet (1,000 mg total) by mouth 3 (three) times daily. 07/17/16 07/24/16  Shary Decamp, PA-C    Family History Family History  Problem Relation Age of Onset  . Diabetes Father   . Hypertension Father     Social History Social History  Substance Use Topics  . Smoking status: Former Research scientist (life sciences)  . Smokeless tobacco: Never Used  . Alcohol use No     Allergies   Patient has no known allergies.   Review of Systems Review of Systems  A complete 10 system review of systems was obtained and all systems are negative except as noted in the HPI and PMH.   Physical Exam Updated Vital Signs BP  116/72   Pulse (!) 56   Temp 98.4 F (36.9 C)   Resp 16   SpO2 98%   Physical Exam  Constitutional: He is oriented to person, place, and time. Vital signs are normal. He appears well-developed and well-nourished.  HENT:  Head: Normocephalic and atraumatic.  Right Ear: Hearing normal.  Left Ear: Hearing normal.  Diffuse dermatomal rash noted on right temple.  Eyes: Conjunctivae and EOM are normal. Pupils are equal, round, and reactive to light.  Neck: Normal range of motion.  Cardiovascular: Normal rate and regular rhythm.   Pulmonary/Chest: Effort normal.  Musculoskeletal:  Normal range of motion.  Neurological: He is alert and oriented to person, place, and time. He has normal strength. No cranial nerve deficit or sensory deficit.  Grip strength normal bilaterally. Cranial Nerves:  II: Pupils equal, round, reactive to light III,IV, VI: ptosis not present, extra-ocular motions intact bilaterally  V,VII: smile symmetric, facial light touch sensation equal VIII: hearing grossly normal bilaterally  IX,X: midline uvula rise  XI: bilateral shoulder shrug equal and strong XII: midline tongue extension  Skin: Skin is warm and dry.  Psychiatric: He has a normal mood and affect. His speech is normal and behavior is normal. Thought content normal.  Nursing note and vitals reviewed.  ED Treatments / Results  DIAGNOSTIC STUDIES: Oxygen Saturation is 98% on RA, normal by my interpretation.   COORDINATION OF CARE: 3:04 PM- Will prescribe Valtrex and Prednisone. Pt verbalizes understanding and agrees to plan.  Medications - No data to display  Labs (all labs ordered are listed, but only abnormal results are displayed) Labs Reviewed - No data to display  EKG  EKG Interpretation None       Radiology No results found.  Procedures Procedures (including critical care time)  Medications Ordered in ED Medications - No data to display   Initial Impression / Assessment and Plan / ED Course  I have reviewed the triage vital signs and the nursing notes.  Pertinent labs & imaging results that were available during my care of the patient were reviewed by me and considered in my medical decision making (see chart for details).     Final Clinical Impressions(s) / ED Diagnoses  I have reviewed the relevant previous healthcare records. I obtained HPI from historian. Patient discussed with supervising physician.  ED Course:  Patient with herpes zoster. Follows dermatomal pattern on lateral forehead. Area is point tender. Seen by PCP recently without rash and  given sumatriptan without relief. Patient will be discharged with Valtrex and steroids. No signs of secondary infection. No signs of disseminated herpes. Patient is without high-risk features of headache including: Sudden onset/thunderclap HA, No similar headache in past, Altered mental status, Accompanying seizure, Headache with exertion, History of immunocompromise, Neck or shoulder pain, Fever, Use of anticoagulation, Family history of spontaneous SAH, Concomitant drug use, Toxic exposure. Patient has a normal complete neurological exam, normal vital signs, normal level of consciousness, no signs of meningismus, is well-appearing/non-toxic appearing, no signs of trauma. No papilledema, no pain over the temporal arteries.Follow up with PCP in 2-3 days. Return precautions discussed. Pt is safe for discharge at this time.   Disposition/Plan:  DC Home Additional Verbal discharge instructions given and discussed with patient.  Pt Instructed to f/u with PCP in the next week for evaluation and treatment of symptoms. Return precautions given Pt acknowledges and agrees with plan  Supervising Physician Drenda Freeze, MD  Final diagnoses:  Herpes zoster without complication  I personally performed the services described in this documentation, which was scribed in my presence. The recorded information has been reviewed and is accurate.   New Prescriptions New Prescriptions   PREDNISONE (DELTASONE) 20 MG TABLET    Take 2 tablets (40 mg total) by mouth daily with breakfast.   VALACYCLOVIR (VALTREX) 1000 MG TABLET    Take 1 tablet (1,000 mg total) by mouth 3 (three) times daily.     Shary Decamp, PA-C 07/17/16 Ridgely Yao, MD 07/17/16 [phone]

## 2016-07-17 NOTE — Discharge Instructions (Signed)
Please read and follow all provided instructions.  Your diagnoses today include:  1. Herpes zoster without complication    Tests performed today include: Vital signs. See below for your results today.   Medications prescribed:  Take as prescribed   Home care instructions:  Follow any educational materials contained in this packet.  Follow-up instructions: Please follow-up with your primary care provider for further evaluation of symptoms and treatment   Return instructions:  Please return to the Emergency Department if you do not get better, if you get worse, or new symptoms OR  - Fever (temperature greater than 101.26F)  - Bleeding that does not stop with holding pressure to the area    -Severe pain (please note that you may be more sore the day after your accident)  - Chest Pain  - Difficulty breathing  - Severe nausea or vomiting  - Inability to tolerate food and liquids  - Passing out  - Skin becoming red around your wounds  - Change in mental status (confusion or lethargy)  - New numbness or weakness    Please return if you have any other emergent concerns.  Additional Information:  Your vital signs today were: BP 116/72    Pulse (!) 56    Temp 98.4 F (36.9 C)    Resp 16    SpO2 98%  If your blood pressure (BP) was elevated above 135/85 this visit, please have this repeated by your doctor within one month. ---------------

## 2016-07-23 ENCOUNTER — Telehealth: Payer: Self-pay | Admitting: Internal Medicine

## 2016-07-23 ENCOUNTER — Telehealth: Payer: Self-pay

## 2016-07-23 MED ORDER — GABAPENTIN 300 MG PO CAPS: 300.0000 mg | ORAL_CAPSULE | Freq: Two times a day (BID) | ORAL | 0 refills | Status: DC | PRN

## 2016-07-23 NOTE — Telephone Encounter (Signed)
He does not need more antiviral medicine. Also the prednisone should not be refilled. Have sent in gabapentin which would help with pain. Sent in and he can take 1 pill up to twice a day as needed. If no relief needs visit.

## 2016-07-23 NOTE — Telephone Encounter (Signed)
PA started via cover my meds.   KEY BBCUUG

## 2016-07-23 NOTE — Telephone Encounter (Signed)
Patient made a [facility] ED visit over this past weekend.  He was diagnosed with shingles.  He has ran out of medication and is in pain.  Would like Dr. Sharlet Salina to call him in what was given to him at ED.  Patient uses [facility] on [address].

## 2016-07-23 NOTE — Telephone Encounter (Signed)
Called pt and gave MD response. He did report that he has had headaches since last night. I told him that if he notices no relief he can make an appointment with you or go to the emergency room. Gareth Eagle

## 2016-07-26 ENCOUNTER — Emergency Department (HOSPITAL_COMMUNITY)
Admission: EM | Admit: 2016-07-26 | Discharge: 2016-07-26 | Disposition: A | Discharge status: Home / Self Care | Payer: PPO | Attending: Emergency Medicine | Admitting: Emergency Medicine

## 2016-07-26 ENCOUNTER — Ambulatory Visit: Payer: PPO | Admitting: Nurse Practitioner

## 2016-07-26 ENCOUNTER — Encounter (HOSPITAL_COMMUNITY): Payer: Self-pay | Admitting: Emergency Medicine

## 2016-07-26 DIAGNOSIS — Z7982 Long term (current) use of aspirin: Secondary | ICD-10-CM | POA: Diagnosis not present

## 2016-07-26 DIAGNOSIS — Z87891 Personal history of nicotine dependence: Secondary | ICD-10-CM | POA: Diagnosis not present

## 2016-07-26 DIAGNOSIS — B0229 Other postherpetic nervous system involvement: Secondary | ICD-10-CM | POA: Diagnosis not present

## 2016-07-26 DIAGNOSIS — B029 Zoster without complications: Secondary | ICD-10-CM | POA: Diagnosis not present

## 2016-07-26 MED ORDER — PREGABALIN 50 MG PO CAPS: 50.0000 mg | ORAL_CAPSULE | Freq: Three times a day (TID) | ORAL | 0 refills | Status: DC

## 2016-07-26 NOTE — ED Provider Notes (Signed)
[facility] DEPT Provider Note   CSN: [id number] Arrival date & time: 07/26/16  1040     History   Chief Complaint Chief Complaint  Patient presents with  . pain where shingles where    HPI Ivo Ackers is a 71 y.o. male.  The history is provided by the patient and medical records. No language interpreter was used.   Drevion Pinales is a 71 y.o. male who was diagnosed with shingles to right forehead on 2/24 here for persistent pain to the area. He states that when it was initially diagnosed, he was placed on Valtrex and prednisone which helped tremendously with the pain. He finished this medication a few days ago and since being off of these medications, pain has started back. He called his PCP who called in a prescription for Gabapentin. He took this medication with little relief in pain but states the medicine made him feel like he was "in a foreign land" and unsteady on his feet. He quit taking this medication and started trying tylenol and ibuprofen, again with little relief. He notes improvement in the rash, just persistent pain. No visual changes.   Past Medical History:  Diagnosis Date  . GERD (gastroesophageal reflux disease)   . Seizures [facility])     Patient Active Problem List   Diagnosis Date Noted  . Left flank pain 01/02/2016  . Dizziness 01/02/2016  . Headache 03/13/2015  . GERD (gastroesophageal reflux disease) 11/15/2014  . Seizure disorder ([place]) 05/07/2014  . Allergic rhinitis 05/07/2014  . Routine general medical examination at a health care facility 05/07/2014    Past Surgical History:  Procedure Laterality Date  . KNEE SURGERY     left miniscus       Home Medications    Prior to Admission medications   Medication Sig Start Date End Date Taking? Authorizing Provider  aspirin 81 MG tablet Take 81 mg by mouth daily.    Historical Provider, MD  butalbital-acetaminophen-caffeine (FIORICET, ESGIC) [phone] MG tablet Take 1 tablet by mouth every 6  (six) hours as needed for headache. 07/13/16   [name], MD  divalproex (DEPAKOTE) 250 MG DR tablet TAKE 1 TABLET (250 MG TOTAL) BY MOUTH 2 (TWO) TIMES DAILY. 06/25/16   [name], MD  gabapentin (NEURONTIN) 300 MG capsule Take 1 capsule (300 mg total) by mouth 2 (two) times daily as needed. 07/23/16   [name], MD  meclizine (ANTIVERT) 25 MG tablet Take 1 tablet (25 mg total) by mouth 3 (three) times daily as needed for dizziness. Patient not taking: Reported on 07/13/2016 04/21/16   Fransico Meadow, PA-C  pregabalin (LYRICA) 50 MG capsule Take 1 capsule (50 mg total) by mouth 3 (three) times daily. 07/26/16   Ozella Almond Ward, PA-C  SUMAtriptan (IMITREX) 25 MG tablet Take 1 tablet (25 mg total) by mouth every 2 (two) hours as needed for migraine. May repeat in 2 hours if headache persists or recurs. 07/16/16   [name], MD    Family History Family History  Problem Relation Age of Onset  . Diabetes Father   . Hypertension Father     Social History Social History  Substance Use Topics  . Smoking status: Former Research scientist (life sciences)  . Smokeless tobacco: Never Used  . Alcohol use No     Allergies   Patient has no known allergies.   Review of Systems Review of Systems  Eyes: Negative for photophobia, pain, discharge, redness, itching and visual disturbance.  Skin: Positive  for rash.     Physical Exam Updated Vital Signs BP 129/83 (BP Location: Left Arm)   Pulse (!) 55   Temp 97.8 F (36.6 C) (Oral)   Resp 17   SpO2 99%   Physical Exam  Constitutional: He is oriented to person, place, and time. He appears well-developed and well-nourished. No distress.  HENT:  Head: Normocephalic and atraumatic.  Eyes: EOM are normal. Pupils are equal, round, and reactive to light.  Cardiovascular: Normal rate, regular rhythm and normal heart sounds.   No murmur heard. Pulmonary/Chest: Effort normal and breath sounds normal. No respiratory distress.    Abdominal: Soft. He exhibits no distension. There is no tenderness.  Neurological: He is alert and oriented to person, place, and time.  Skin: Skin is warm and dry.  Diffuse dermatomal rash noted on right temple.   Nursing note and vitals reviewed.    ED Treatments / Results  Labs (all labs ordered are listed, but only abnormal results are displayed) Labs Reviewed - No data to display  EKG  EKG Interpretation None       Radiology No results found.  Procedures Procedures (including critical care time)  Medications Ordered in ED Medications - No data to display   Initial Impression / Assessment and Plan / ED Course  I have reviewed the triage vital signs and the nursing notes.  Pertinent labs & imaging results that were available during my care of the patient were reviewed by me and considered in my medical decision making (see chart for details).    Hernando Pretorius is a 71 y.o. male who presents to ED for continued pain to area where he was previously diagnosed with shingles on 2/24. Rash is improving. No ocular involvement. Main complaint today is continued pain. PCP called in a prescription for gabapentin but he states this has not helped. Will change to Lyrica for post-herpetic neuralgia. PCP follow up encouraged.   Patient seen by and discussed with Dr. Regenia Skeeter who agrees with treatment plan.    Final Clinical Impressions(s) / ED Diagnoses   Final diagnoses:  Post herpetic neuralgia    New Prescriptions New Prescriptions   PREGABALIN (LYRICA) 50 MG CAPSULE    Take 1 capsule (50 mg total) by mouth 3 (three) times daily.       [facility] Ward, PA-C 07/26/16 [place], MD 07/26/16 [phone]

## 2016-07-26 NOTE — Discharge Instructions (Signed)
Take lyrica three times daily for pain.  Follow up with your primary care physician if symptoms do not improve.  Return to ER for visual changes, new or worsening symptoms, any additional concerns.

## 2016-07-26 NOTE — ED Triage Notes (Signed)
Patient was seen and treated for shingles on face on the 2/24. Patient reports that he is out of prednisone and went to PCP for pain but no shingles and was given Gabapentin but states that not helping with the pain.  Patient wanting something else for pain

## 2016-07-26 NOTE — ED Notes (Signed)
ED Provider at bedside. 

## 2016-07-26 NOTE — ED Notes (Signed)
Discharge instruction, follow up care, and rx x1 reviewed with patient. Patient verbalized understanding.

## 2016-07-28 ENCOUNTER — Encounter: Payer: Self-pay | Admitting: Nurse Practitioner

## 2016-07-28 ENCOUNTER — Ambulatory Visit (INDEPENDENT_AMBULATORY_CARE_PROVIDER_SITE_OTHER): Payer: PPO | Admitting: Nurse Practitioner

## 2016-07-28 VITALS — BP 154/94 | HR 52 | Temp 97.9°F | Ht 76.0 in | Wt 247.0 lb

## 2016-07-28 DIAGNOSIS — R519 Headache, unspecified: Secondary | ICD-10-CM

## 2016-07-28 DIAGNOSIS — R51 Headache: Secondary | ICD-10-CM

## 2016-07-28 DIAGNOSIS — H5314 Visual discomfort: Secondary | ICD-10-CM | POA: Diagnosis not present

## 2016-07-28 DIAGNOSIS — B0222 Postherpetic trigeminal neuralgia: Secondary | ICD-10-CM | POA: Diagnosis not present

## 2016-07-28 DIAGNOSIS — H524 Presbyopia: Secondary | ICD-10-CM | POA: Diagnosis not present

## 2016-07-28 MED ORDER — VALACYCLOVIR HCL 1 G PO TABS: 1000.0000 mg | ORAL_TABLET | Freq: Three times a day (TID) | ORAL | 0 refills | Status: DC

## 2016-07-28 MED ORDER — GABAPENTIN 300 MG PO CAPS: ORAL_CAPSULE | ORAL | 0 refills | Status: DC

## 2016-07-28 MED ORDER — PREDNISONE 10 MG (21) PO TBPK: ORAL_TABLET | ORAL | 0 refills | Status: DC

## 2016-07-28 NOTE — Progress Notes (Signed)
Subjective:  Patient ID: Nicholas Rasmussen, male    DOB: [birth year]  Age: 71 y.o. MRN: [id number]  CC: Headache (headache--went to ER--dx w shingle & headache--got meds--not better)   Headache   This is a recurrent problem. The current episode started 1 to 4 weeks ago. The problem occurs constantly. The problem has been waxing and waning. The pain is located in the right unilateral, frontal, occipital and retro-orbital region. The pain quality is not similar to prior headaches. The quality of the pain is described as sharp and stabbing. The pain is severe. Associated symptoms include eye pain, eye redness, eye watering and photophobia. Pertinent negatives include no abdominal pain, abnormal behavior, anorexia, back pain, blurred vision, coughing, dizziness, drainage, ear pain, facial sweating, fever, hearing loss, insomnia, loss of balance, muscle aches, nausea, neck pain, numbness, phonophobia, rhinorrhea, scalp tenderness, seizures, sinus pressure, sore throat, swollen glands, tingling, tinnitus, visual change, vomiting, weakness or weight loss. The symptoms are aggravated by bright light and exposure to cold air. He has tried acetaminophen, darkened room, triptans and NSAIDs (gabapentin, and lyrica) for the symptoms. The treatment provided moderate relief. There is no history of cancer, cluster headaches, hypertension, immunosuppression, migraine headaches, migraines in the family, obesity, pseudotumor cerebri, recent head traumas, sinus disease or TMJ.  Rash  This is a new problem. The current episode started 1 to 4 weeks ago. The problem has been gradually improving since onset. The affected locations include the face (right side of forehead). The rash is characterized by bruising, burning, pain and redness. Associated symptoms include eye pain. Pertinent negatives include no anorexia, congestion, cough, diarrhea, facial edema, fatigue, fever, rhinorrhea, sore throat or vomiting. Treatments tried:  valtrex, prednisone. The treatment provided significant relief. His past medical history is significant for varicella.   Had significant relief while on oral prednisone and valtrex. Has some temporal relief with gabapentin. Unable to use lyrica due to adverse side effect (altered mental status).  Outpatient Medications Prior to Visit  Medication Sig Dispense Refill  . aspirin 81 MG tablet Take 81 mg by mouth daily.    . divalproex (DEPAKOTE) 250 MG DR tablet TAKE 1 TABLET (250 MG TOTAL) BY MOUTH 2 (TWO) TIMES DAILY. 60 tablet 2  . gabapentin (NEURONTIN) 300 MG capsule Take 1 capsule (300 mg total) by mouth 2 (two) times daily as needed. 45 capsule 0  . butalbital-acetaminophen-caffeine (FIORICET, ESGIC) 50-325-40 MG tablet Take 1 tablet by mouth every 6 (six) hours as needed for headache. (Patient not taking: Reported on 07/28/2016) 14 tablet 0  . meclizine (ANTIVERT) 25 MG tablet Take 1 tablet (25 mg total) by mouth 3 (three) times daily as needed for dizziness. (Patient not taking: Reported on 07/13/2016) 30 tablet 0  . pregabalin (LYRICA) 50 MG capsule Take 1 capsule (50 mg total) by mouth 3 (three) times daily. (Patient not taking: Reported on 07/28/2016) 90 capsule 0  . SUMAtriptan (IMITREX) 25 MG tablet Take 1 tablet (25 mg total) by mouth every 2 (two) hours as needed for migraine. May repeat in 2 hours if headache persists or recurs. (Patient not taking: Reported on 07/28/2016) 10 tablet 0   No facility-administered medications prior to visit.     ROS See HPI  Objective:  BP (!) 154/94   Pulse (!) 52   Temp 97.9 F (36.6 C)   Ht 6\' 4"  (1.93 m)   Wt 247 lb (112 kg)   SpO2 97%   BMI 30.07 kg/m   BP Readings from Last  3 Encounters:  07/28/16 (!) 154/94  07/26/16 (!) 153/107  07/17/16 130/79    Wt Readings from Last 3 Encounters:  07/28/16 247 lb (112 kg)  07/13/16 247 lb (112 kg)  04/28/16 253 lb (114.8 kg)    Physical Exam  Constitutional: He is oriented to person, place,  and time. No distress.  HENT:  Right Ear: External ear normal.  Nose: Nose normal.  Mouth/Throat: Oropharynx is clear and moist. No oropharyngeal exudate.  Eyes: Conjunctivae and EOM are normal. Pupils are equal, round, and reactive to light. Right eye exhibits discharge. Left eye exhibits no discharge. No scleral icterus.  Increased tearing of right eye  Neck: Normal range of motion. Neck supple. No thyromegaly present.  Cardiovascular: Normal rate.   Pulmonary/Chest: Effort normal.  Musculoskeletal: Normal range of motion. He exhibits no tenderness.  Lymphadenopathy:    He has no cervical adenopathy.  Neurological: He is alert and oriented to person, place, and time.  Skin: Skin is warm and dry. Rash noted. Rash is macular. There is erythema.     Vitals reviewed.   Lab Results  Component Value Date   WBC 5.0 04/21/2016   HGB 12.5 (L) 04/21/2016   HCT 39.2 04/21/2016   PLT 183 04/21/2016   GLUCOSE 100 (H) 04/21/2016   CHOL 176 10/28/2015   TRIG 122.0 10/28/2015   HDL 47.50 10/28/2015   LDLCALC 104 (H) 10/28/2015   ALT 10 (L) 04/21/2016   AST 21 04/21/2016   NA 139 04/21/2016   K 3.8 04/21/2016   CL 107 04/21/2016   CREATININE 1.14 04/21/2016   BUN 14 04/21/2016   CO2 27 04/21/2016   TSH 1.08 11/12/2014   HGBA1C 6.2 04/12/2016    No results found.  Assessment & Plan:   Mouhamad was seen today for headache.  Diagnoses and all orders for this visit:  Post-herpetic trigeminal neuralgia -     Ambulatory referral to Ophthalmology -     predniSONE (STERAPRED UNI-PAK 21 TAB) 10 MG (21) TBPK tablet; Take by mouth as directed. -     valACYclovir (VALTREX) 1000 MG tablet; Take 1 tablet (1,000 mg total) by mouth 3 (three) times daily. -     gabapentin (NEURONTIN) 300 MG capsule; Take 1cap in morning and 2caps at bedtime prn  Acute nonintractable headache, unspecified headache type -     Ambulatory referral to Neurology   I have discontinued Mr. Berberian's meclizine,  butalbital-acetaminophen-caffeine, SUMAtriptan, and pregabalin. I have also changed his gabapentin. Additionally, I am having him start on predniSONE and valACYclovir. Lastly, [name] his aspirin and divalproex.  Meds ordered this encounter  Medications  . predniSONE (STERAPRED UNI-PAK 21 TAB) 10 MG (21) TBPK tablet    Sig: Take by mouth as directed.    Dispense:  21 tablet    Refill:  0    Order Specific Question:   Supervising Provider    Answer:   Cassandria Anger [[id number]]  . valACYclovir (VALTREX) 1000 MG tablet    Sig: Take 1 tablet (1,000 mg total) by mouth 3 (three) times daily.    Dispense:  21 tablet    Refill:  0    Order Specific Question:   Supervising Provider    Answer:   Cassandria Anger [[id number]]  . gabapentin (NEURONTIN) 300 MG capsule    Sig: Take 1cap in morning and 2caps at bedtime prn    Dispense:  45 capsule    Refill:  0  Order Specific Question:   Supervising Provider    Answer:   Cassandria Anger [[id number]]    Follow-up: Return if symptoms worsen or fail to improve.  [name], NP

## 2016-07-28 NOTE — Telephone Encounter (Signed)
Cover my meds returned response: Medication is available without authorization. So no PA was needed.   Can you call pt?

## 2016-07-28 NOTE — Progress Notes (Signed)
Pre visit review using our clinic review tool, if applicable. No additional management support is needed unless otherwise documented below in the visit note. 

## 2016-07-28 NOTE — Patient Instructions (Signed)
Shingles Shingles, which is also known as herpes zoster, is an infection that causes a painful skin rash and fluid-filled blisters. Shingles is not related to genital herpes, which is a sexually transmitted infection. Shingles only develops in people who:  Have had chickenpox.  Have received the chickenpox vaccine. (This is rare.)  What are the causes? Shingles is caused by varicella-zoster virus (VZV). This is the same virus that causes chickenpox. After exposure to VZV, the virus stays in the body in an inactive (dormant) state. Shingles develops if the virus reactivates. This can happen many years after the initial exposure to VZV. It is not known what causes this virus to reactivate. What increases the risk? People who have had chickenpox or received the chickenpox vaccine are at risk for shingles. Infection is more common in people who:  Are older than age 50.  Have a weakened defense (immune) system, such as those with HIV, AIDS, or cancer.  Are taking medicines that weaken the immune system, such as transplant medicines.  Are under great stress.  What are the signs or symptoms? Early symptoms of this condition include itching, tingling, and pain in an area on your skin. Pain may be described as burning, stabbing, or throbbing. A few days or weeks after symptoms start, a painful red rash appears, usually on one side of the body in a bandlike or beltlike pattern. The rash eventually turns into fluid-filled blisters that break open, scab over, and dry up in about 2-3 weeks. At any time during the infection, you may also develop:  A fever.  Chills.  A headache.  An upset stomach.  How is this diagnosed? This condition is diagnosed with a skin exam. Sometimes, skin or fluid samples are taken from the blisters before a diagnosis is made. These samples are examined under a microscope or sent to a lab for testing. How is this treated? There is no specific cure for this condition.  Your health care provider will probably prescribe medicines to help you manage pain, recover more quickly, and avoid long-term problems. Medicines may include:  Antiviral drugs.  Anti-inflammatory drugs.  Pain medicines.  If the area involved is on your face, you may be referred to a specialist, such as an eye doctor (ophthalmologist) or an ear, nose, and throat (ENT) doctor to help you avoid eye problems, chronic pain, or disability. Follow these instructions at home: Medicines  Take medicines only as directed by your health care provider.  Apply an anti-itch or numbing cream to the affected area as directed by your health care provider. Blister and Rash Care  Take a cool bath or apply cool compresses to the area of the rash or blisters as directed by your health care provider. This may help with pain and itching.  Keep your rash covered with a loose bandage (dressing). Wear loose-fitting clothing to help ease the pain of material rubbing against the rash.  Keep your rash and blisters clean with mild soap and cool water or as directed by your health care provider.  Check your rash every day for signs of infection. These include redness, swelling, and pain that lasts or increases.  Do not pick your blisters.  Do not scratch your rash. General instructions  Rest as directed by your health care provider.  Keep all follow-up visits as directed by your health care provider. This is important.  Until your blisters scab over, your infection can cause chickenpox in people who have never had it or been vaccinated   against it. To prevent this from happening, avoid contact with other people, especially: ? Babies. ? Pregnant women. ? Children who have eczema. ? Elderly people who have transplants. ? People who have chronic illnesses, such as leukemia or AIDS. Contact a health care provider if:  Your pain is not relieved with prescribed medicines.  Your pain does not get better after  the rash heals.  Your rash looks infected. Signs of infection include redness, swelling, and pain that lasts or increases. Get help right away if:  The rash is on your face or nose.  You have facial pain, pain around your eye area, or loss of feeling on one side of your face.  You have ear pain or you have ringing in your ear.  You have loss of taste.  Your condition gets worse. This information is not intended to replace advice given to you by your health care provider. Make sure you discuss any questions you have with your health care provider. Document Released: 05/10/2005 Document Revised: 01/04/2016 Document Reviewed: 03/21/2014 Elsevier Interactive Patient Education  2017 Elsevier Inc.  

## 2016-07-28 NOTE — Telephone Encounter (Signed)
Called patient. No answer, LVM to inform patient. Call back if had any questions.

## 2016-07-29 ENCOUNTER — Ambulatory Visit (INDEPENDENT_AMBULATORY_CARE_PROVIDER_SITE_OTHER): Payer: PPO | Admitting: Neurology

## 2016-07-29 ENCOUNTER — Encounter: Payer: Self-pay | Admitting: Neurology

## 2016-07-29 VITALS — BP 138/81 | HR 58 | Ht 76.0 in | Wt 249.0 lb

## 2016-07-29 DIAGNOSIS — B0229 Other postherpetic nervous system involvement: Secondary | ICD-10-CM | POA: Diagnosis not present

## 2016-07-29 DIAGNOSIS — B0222 Postherpetic trigeminal neuralgia: Secondary | ICD-10-CM | POA: Diagnosis not present

## 2016-07-29 DIAGNOSIS — G40909 Epilepsy, unspecified, not intractable, without status epilepticus: Secondary | ICD-10-CM | POA: Diagnosis not present

## 2016-07-29 MED ORDER — GABAPENTIN 300 MG PO CAPS: ORAL_CAPSULE | ORAL | 0 refills | Status: DC

## 2016-07-29 NOTE — Progress Notes (Addendum)
PATIENT: Nicholas Rasmussen DOB: 08/26/1945  Chief Complaint  Patient presents with  . Post-herpetic neuralgia    He was diagnosed with shingles 07/17/16.  He is on his second Prednisone dose pack, gabapentin and Valtrex.  He is still having pain on the right side of his face, head and behind his ear.  His medications are working well to improve his pain.  Lyrica caused him to feel too cloudy-headed.  Marland Kitchen PCP    [name], MD ([name], [place])     HISTORICAL  Nicholas Rasmussen is a 71 year old right-handed male, seen in refer by his primary care doctor  [name] for evaluation of postherpetic neuralgia, initial evaluation was on March eighth 2018.  He had a history of epilepsy, for seizure was in high school, had about 4 generalized seizure in his lifetime, last a seizure was in 1975, all seizure has similar seminology, he would woke up from sleep and felt body vibrating sensation, few seconds later, he would get into generalized tonic-clonic seizure, he was initially treated with Dilantin, after most recent recurrent seizure, he was switched to current dose of Depakote DR 250 mg Twice a day, he tolerated the medication well, there was no seizure, no auras.  On July 13 2016, he noticed right frontal area headaches, which was quite unusual for him, next day, he noticed rash broke out, nerve pain, sensitive to touch, he was diagnosed with shingles, was treated with veltrex 1000 mg tid and tapering dose of prednisone for 1 week, rash lasted about 1 week, gradually resolved, for a while, he has sharp and neuropathic pain at right frontal region, difficulty sleeping.   Now his pain has much improved, he is not taking gabapentin 300 mg/600 mg, mild dizziness and lightheadedness with medications, he can sleep much better now  Personally reviewed CT head without contrast November 2017 that was normal. Laboratory evaluation normal CMP, CBC, hemoglobin was 12.5, A1c 6.2  REVIEW  OF SYSTEMS: Full 14 system review of systems performed and notable only for Headache  ALLERGIES: No Known Allergies  HOME MEDICATIONS: Current Outpatient Prescriptions  Medication Sig Dispense Refill  . aspirin 81 MG tablet Take 81 mg by mouth daily.    . divalproex (DEPAKOTE) 250 MG DR tablet TAKE 1 TABLET (250 MG TOTAL) BY MOUTH 2 (TWO) TIMES DAILY. 60 tablet 2  . gabapentin (NEURONTIN) 300 MG capsule Take 1cap in morning and 2caps at bedtime prn 45 capsule 0  . predniSONE (STERAPRED UNI-PAK 21 TAB) 10 MG (21) TBPK tablet Take by mouth as directed. 21 tablet 0  . valACYclovir (VALTREX) 1000 MG tablet Take 1 tablet (1,000 mg total) by mouth 3 (three) times daily. 21 tablet 0   No current facility-administered medications for this visit.     PAST MEDICAL HISTORY: Past Medical History:  Diagnosis Date  . GERD (gastroesophageal reflux disease)   . Post herpetic neuralgia   . Seizures ([place])    Last seizure in the 70s.  . Shingles     PAST SURGICAL HISTORY: Past Surgical History:  Procedure Laterality Date  . KNEE SURGERY Left    left miniscus  . uvala surgery      FAMILY HISTORY: Family History  Problem Relation Age of Onset  . Diabetes Father   . Hypertension Father   . Other Mother     natural causes    SOCIAL HISTORY:  Social History   Social History  . Marital status: Single    Spouse  name: N/A  . Number of children: 3  . Years of education: Associates   Occupational History  . Retired    Social History Main Topics  . Smoking status: Former Research scientist (life sciences)  . Smokeless tobacco: Never Used  . Alcohol use No  . Drug use: No  . Sexual activity: Not on file   Other Topics Concern  . Not on file   Social History Narrative   Lives alone.   Right-handed.   Rarely drinks caffeine.        PHYSICAL EXAM   Vitals:   07/29/16 1340  BP: 138/81  Pulse: (!) 58  Weight: 249 lb (112.9 kg)  Height: 6\' 4"  (1.93 m)    Not recorded      Body mass index is  30.31 kg/m.  PHYSICAL EXAMNIATION:  Gen: NAD, conversant, well nourised, obese, well groomed                     Cardiovascular: Regular rate rhythm, no peripheral edema, warm, nontender. Eyes: Conjunctivae clear without exudates or hemorrhage Neck: Supple, no carotid bruits. Pulmonary: Clear to auscultation bilaterally   NEUROLOGICAL EXAM:  MENTAL STATUS: Speech:    Speech is normal; fluent and spontaneous with normal comprehension.  Cognition:     Orientation to time, place and person     Normal recent and remote memory     Normal Attention span and concentration     Normal Language, naming, repeating,spontaneous speech     Fund of knowledge   CRANIAL NERVES: CN II: Visual fields are full to confrontation. Fundoscopic exam is normal with sharp discs and no vascular changes. Pupils are round equal and briskly reactive to light. CN III, IV, VI: extraocular movement are normal. No ptosis. CN V: Facial sensation is intact to pinprick in all 3 divisions bilaterally. Corneal responses are intact.  CN VII: Face is symmetric with normal eye closure and smile. CN VIII: Hearing is normal to rubbing fingers CN IX, X: Palate elevates symmetrically. Phonation is normal. CN XI: Head turning and shoulder shrug are intact CN XII: Tongue is midline with normal movements and no atrophy.  MOTOR: There is no pronator drift of out-stretched arms. Muscle bulk and tone are normal. Muscle strength is normal.  REFLEXES: Reflexes are 2+ and symmetric at the biceps, triceps, knees, and ankles. Plantar responses are flexor.  SENSORY: Intact to light touch, pinprick, positional sensation and vibratory sensation are intact in fingers and toes.  COORDINATION: Rapid alternating movements and fine finger movements are intact. There is no dysmetria on finger-to-nose and heel-knee-shin.    GAIT/STANCE: Posture is normal. Gait is steady with normal steps, base, arm swing, and turning. Heel and toe  walking are normal. Tandem gait is normal.  Romberg is absent.   DIAGNOSTIC DATA (LABS, IMAGING, TESTING) - I reviewed patient records, labs, notes, testing and imaging myself where available.   ASSESSMENT AND PLAN  Nicholas Rasmussen is a 71 y.o. male   Right V1 shingles in February 2018, mild postherpetic neuralgia pain  Responded very well to current dose of gabapentin 300 mg in the morning, 600 mg at nighttime  If he continues to do well, I have advised him to taper of gabapentin  Epilepsy, last recurrence seizure in 1975  On low-dose Depakote DR 250 mg twice a day  He wants to hold off evaluation such as MRI of the brain, EEG, potential medication change at this point   Marcial Pacas, M.D. Ph.D.  [facility] [address] [place], [place] [address] Ph: [phone] Fax: [phone]  CC: [name],

## 2016-08-11 ENCOUNTER — Other Ambulatory Visit: Payer: Self-pay | Admitting: Internal Medicine

## 2016-08-13 ENCOUNTER — Encounter: Payer: Self-pay | Admitting: Nurse Practitioner

## 2016-08-13 ENCOUNTER — Telehealth: Payer: Self-pay | Admitting: Internal Medicine

## 2016-08-13 ENCOUNTER — Ambulatory Visit (INDEPENDENT_AMBULATORY_CARE_PROVIDER_SITE_OTHER): Payer: PPO | Admitting: Nurse Practitioner

## 2016-08-13 DIAGNOSIS — B0222 Postherpetic trigeminal neuralgia: Secondary | ICD-10-CM

## 2016-08-13 MED ORDER — GABAPENTIN 300 MG PO CAPS: ORAL_CAPSULE | ORAL | 0 refills | Status: DC

## 2016-08-13 NOTE — Telephone Encounter (Signed)
Patient informed. He has an appointment on June 27 at [facility].

## 2016-08-13 NOTE — Telephone Encounter (Signed)
Fine with me

## 2016-08-13 NOTE — Patient Instructions (Signed)
Postherpetic Neuralgia Postherpetic neuralgia (PHN) is nerve pain that occurs after a shingles infection. Shingles is a painful rash that appears on one side of the body, usually on your trunk or face. Shingles is caused by the varicella-zoster virus. This is the same virus that causes chickenpox. In people who have had chickenpox, the virus can resurface years later and cause shingles. You may have PHN if you continue to have pain for 3 months after your shingles rash has gone away. PHN appears in the same area where you had the shingles rash. For most people, PHN goes away within 1 year. Getting a vaccination for shingles can prevent PHN. This vaccine is recommended for people older than 50. It may prevent shingles and may also lower your risk of PHN if you do get shingles. What are the causes? PHN is caused by damage to your nerves from the varicella-zoster virus. This damage makes your nerves overly sensitive. What increases the risk? Aging is the biggest risk factor for developing PHN. Most people who get PHN are older than 60. Other risk factors include:  Having very bad pain before your shingles rash starts.  Having a very bad rash.  Having shingles in the nerve that supplies your face and eye (trigeminal nerve). What are the signs or symptoms? Pain is the main symptom of PHN. The pain is often very bad and may be described as stabbing, burning, or feeling like an electric shock. The pain may come and go or may be there all the time. Pain may be triggered by light touches on the skin or changes in temperature. You may have itching along with the pain. How is this diagnosed? Your health care provider may diagnose PHN based on your symptoms and your history of shingles. Lab studies and other diagnostic tests are usually not needed. How is this treated? There is no cure for PHN. Treatment for PHN will focus on pain relief. Over-the-counter pain relievers do not usually relieve PHN pain. You  may need to work with a pain specialist. Treatment may include:  Antidepressant medicines to help with pain and improve sleep.  Antiseizure medicines to relieve nerve pain.  Strong pain relievers (opioids).  A numbing patch worn on the skin (lidocaine patch). Follow these instructions at home: It may take a long time to recover from PHN. Work closely with your health care provider, and have a good support system at home.  Take all medicines as directed by your health care provider.  Wear loose, comfortable clothing.  Cover sensitive areas with a dressing to reduce friction from clothing rubbing on the area.  If cold does not make your pain worse, try applying a cool compress or cooling gel pack to the area.  Talk to your health care provider if you feel depressed or desperate. Living with long-term pain can be depressing. Contact a health care provider if:  Your medicine is not helping.  You are struggling to manage your pain at home. This information is not intended to replace advice given to you by your health care provider. Make sure you discuss any questions you have with your health care provider. Document Released: 07/31/2002 Document Revised: 10/16/2015 Document Reviewed: 05/01/2013 Elsevier Interactive Patient Education  2017 Elsevier Inc.  

## 2016-08-13 NOTE — Progress Notes (Signed)
Subjective:  Patient ID: Nicholas Rasmussen, male    DOB: 18-Apr-1946  Age: 71 y.o. MRN: [id number]  CC: Headache (headache on right side head. )   Headache   This is a new problem. The current episode started 1 to 4 weeks ago. The problem occurs intermittently. The problem has been waxing and waning. The pain is located in the right unilateral, frontal and occipital region. The pain does not radiate. The pain quality is similar to prior headaches (with herpetic rash). The quality of the pain is described as aching and dull. Pertinent negatives include no abdominal pain, abnormal behavior, facial sweating, fever, scalp tenderness, seizures or sinus pressure.  developed headache again 2days ago. No change in vision.  he reports he was evaluated by opthalmology, normal eye exam per patient.  Reports complete resolution of rash and headache with use of gabapentin, valtrex and oral prednisone.  Outpatient Medications Prior to Visit  Medication Sig Dispense Refill  . aspirin 81 MG tablet Take 81 mg by mouth daily.    . divalproex (DEPAKOTE) 250 MG DR tablet TAKE 1 TABLET (250 MG TOTAL) BY MOUTH 2 (TWO) TIMES DAILY. 60 tablet 2  . gabapentin (NEURONTIN) 300 MG capsule Take 1cap in morning and 2caps at bedtime prn (Patient not taking: Reported on 08/13/2016) 90 capsule 0  . predniSONE (STERAPRED UNI-PAK 21 TAB) 10 MG (21) TBPK tablet Take by mouth as directed. (Patient not taking: Reported on 08/13/2016) 21 tablet 0  . valACYclovir (VALTREX) 1000 MG tablet Take 1 tablet (1,000 mg total) by mouth 3 (three) times daily. (Patient not taking: Reported on 08/13/2016) 21 tablet 0   No facility-administered medications prior to visit.     ROS See HPI  Objective:  BP 122/80   Pulse (!) 53   Temp 97.3 F (36.3 C)   Ht 6\' 4"  (1.93 m)   Wt 250 lb (113.4 kg)   SpO2 98%   BMI 30.43 kg/m   BP Readings from Last 3 Encounters:  08/13/16 122/80  07/29/16 138/81  07/28/16 (!) 154/94    Wt Readings  from Last 3 Encounters:  08/13/16 250 lb (113.4 kg)  07/29/16 249 lb (112.9 kg)  07/28/16 247 lb (112 kg)    Physical Exam  Constitutional: He is oriented to person, place, and time. No distress.  Eyes: Conjunctivae and EOM are normal. Pupils are equal, round, and reactive to light. Right eye exhibits no discharge. Left eye exhibits no discharge. No scleral icterus.  Neck: Normal range of motion. Neck supple.  Cardiovascular: Normal rate, regular rhythm and normal heart sounds.   Pulmonary/Chest: Effort normal.  Lymphadenopathy:    He has no cervical adenopathy.  Neurological: He is alert and oriented to person, place, and time. Coordination normal.  Skin: Skin is warm and dry. No rash noted. No erythema.  Vitals reviewed.   Lab Results  Component Value Date   WBC 5.0 04/21/2016   HGB 12.5 (L) 04/21/2016   HCT 39.2 04/21/2016   PLT 183 04/21/2016   GLUCOSE 100 (H) 04/21/2016   CHOL 176 10/28/2015   TRIG 122.0 10/28/2015   HDL 47.50 10/28/2015   LDLCALC 104 (H) 10/28/2015   ALT 10 (L) 04/21/2016   AST 21 04/21/2016   NA 139 04/21/2016   K 3.8 04/21/2016   CL 107 04/21/2016   CREATININE 1.14 04/21/2016   BUN 14 04/21/2016   CO2 27 04/21/2016   TSH 1.08 11/12/2014   HGBA1C 6.2 04/12/2016    No results  found.  Assessment & Plan:   Nicholas Rasmussen was seen today for headache.  Diagnoses and all orders for this visit:  Post-herpetic trigeminal neuralgia -     gabapentin (NEURONTIN) 300 MG capsule; Take 1cap in morning and 2caps at bedtime prn   I have discontinued Nicholas Rasmussen's predniSONE and valACYclovir. I am also having him maintain his aspirin, divalproex, and gabapentin.  Meds ordered this encounter  Medications  . gabapentin (NEURONTIN) 300 MG capsule    Sig: Take 1cap in morning and 2caps at bedtime prn    Dispense:  90 capsule    Refill:  0    Order Specific Question:   Supervising Provider    Answer:   Nicholas Rasmussen [[id number]]    Follow-up: Return in  about 3 months (around 11/13/2016) for CPE (fasting).  Nicholas Lacy, NP

## 2016-08-13 NOTE — Telephone Encounter (Signed)
Patient would like to transfer care over to [facility]. He feels the chemistry is better with her.   Dr. Sharlet Salina is that ok with you? Nicholas Rasmussen is that ok with you? ( I will set up his CPE/Transfer appointment together if that is ok with you. When he saw you today you left on his avs to come back in 42months for cpe.)  Thank you ladies.

## 2016-08-13 NOTE — Progress Notes (Signed)
Pre visit review using our clinic review tool, if applicable. No additional management support is needed unless otherwise documented below in the visit note. 

## 2016-08-13 NOTE — Telephone Encounter (Signed)
ok 

## 2016-08-18 DIAGNOSIS — H35033 Hypertensive retinopathy, bilateral: Secondary | ICD-10-CM | POA: Diagnosis not present

## 2016-08-18 DIAGNOSIS — H02821 Cysts of right upper eyelid: Secondary | ICD-10-CM | POA: Diagnosis not present

## 2016-08-18 DIAGNOSIS — Z961 Presence of intraocular lens: Secondary | ICD-10-CM | POA: Diagnosis not present

## 2016-08-18 DIAGNOSIS — H43813 Vitreous degeneration, bilateral: Secondary | ICD-10-CM | POA: Diagnosis not present

## 2016-09-03 ENCOUNTER — Telehealth: Payer: Self-pay

## 2016-09-03 ENCOUNTER — Encounter: Payer: Self-pay | Admitting: Family Medicine

## 2016-09-03 ENCOUNTER — Ambulatory Visit (INDEPENDENT_AMBULATORY_CARE_PROVIDER_SITE_OTHER): Payer: PPO | Admitting: Family Medicine

## 2016-09-03 VITALS — BP 122/62 | HR 57 | Temp 97.6°F | Ht 76.0 in | Wt 253.4 lb

## 2016-09-03 DIAGNOSIS — M79662 Pain in left lower leg: Secondary | ICD-10-CM | POA: Diagnosis not present

## 2016-09-03 LAB — D-DIMER, QUANTITATIVE: D-Dimer, Quant: 0.71 mcg/mL FEU — ABNORMAL HIGH (ref <0.50)

## 2016-09-03 NOTE — Addendum Note (Signed)
Addended by: Marin Olp on: 09/03/2016 02:40 PM   Modules accepted: Orders, Level of Service

## 2016-09-03 NOTE — Patient Instructions (Signed)
Please stop by lab before you go  If this test is negative it rules out blood clot.   If negative:  Lets have you ice the area 3x a day for 20 minutes then switch to heat  Aleve twice a day for 7 days  Take it easy on dancing and any other activity that causes pain  We also have good sports medicine doctors we can refer you to if needed

## 2016-09-03 NOTE — Progress Notes (Addendum)
Subjective:  Nicholas Rasmussen is a 71 y.o. year old very pleasant male patient who presents for/with See problem oriented charting ROS- no chest pain or shortness of breath. No clear claudication. No redness over calf. No rash over calf   Past Medical History-  Patient Active Problem List   Diagnosis Date Noted  . Postherpetic neuralgia 07/29/2016  . Left flank pain 01/02/2016  . Dizziness 01/02/2016  . Headache 03/13/2015  . GERD (gastroesophageal reflux disease) 11/15/2014  . Seizure disorder ([place]) 05/07/2014  . Allergic rhinitis 05/07/2014  . Routine general medical examination at a health care facility 05/07/2014    Medications- reviewed and updated Current Outpatient Prescriptions  Medication Sig Dispense Refill  . aspirin 81 MG tablet Take 81 mg by mouth daily.    . divalproex (DEPAKOTE) 250 MG DR tablet TAKE 1 TABLET (250 MG TOTAL) BY MOUTH 2 (TWO) TIMES DAILY. 60 tablet 2  . gabapentin (NEURONTIN) 300 MG capsule Take 1cap in morning and 2caps at bedtime prn (Patient not taking: Reported on 09/03/2016) 90 capsule 0   No current facility-administered medications for this visit.     Objective: BP 122/62 (BP Location: Left Arm, Patient Position: Sitting, Cuff Size: Large)   Pulse (!) 57   Temp 97.6 F (36.4 C) (Oral)   Ht 6\' 4"  (1.93 m)   Wt 253 lb 6.4 oz (114.9 kg)   SpO2 97%   BMI 30.84 kg/m  Gen: NAD, resting comfortably CV: RRR no murmurs rubs or gallops Lungs: CTAB no crackles, wheeze, rhonchi Abdomen: soft/nontender/nondistended/normal bowel sounds. No rebound or guarding.  Ext: no edema, 2+ DP and PT pulses on left foot. Equal calf size within 0.5 cm 10 cm below tibial plateau. Pain worse with toe raise. Pain in lower portion of calf with moderate palpation.  Skin: warm, dry   Assessment/Plan:  Pain of left calf - Plan: D-dimer, Quantitative S: left calf pain for 5 days. Worse with walking but can feel at rest. Does ballroom dancing every Thursday and Saturday  and went yesterday some pain with this. No swelling in the leg. Worst of pain is aching 6/10, sometimes no pain at all. Worse with going onto his toes.   Getting ready to go out of town on Saturday- [place]. Did drive to [place] in march.  A/P:  71 year old male that I think has Calf strain but with calf pain and long drive within last month- will get d- dimer to rule out DVT. Gave instructions for calf strain if test is negative as hoped for. Good pulses- doubt arterial issue.    Orders Placed This Encounter  Procedures  . D-dimer, Quantitative   Return precautions advised.  [name], MD   Addendum: D- Dimer positive. Will come by for xarelto 15mg  BID samples (#14) and will take through time we get venous duplex on Monday. He is aware possible blood clot but also could be false positive test- only the venous duplex will really tell us.   [name]

## 2016-09-03 NOTE — Progress Notes (Signed)
Pre visit review using our clinic review tool, if applicable. No additional management support is needed unless otherwise documented below in the visit note. 

## 2016-09-03 NOTE — Telephone Encounter (Signed)
Verbal order received for patient to receive sample medication. Patient teaching completed by provider.   Medication (including dosage):Xarelto 15 mg  Lot #:[id number] Expiration: 01-20  Qty: (2) bottles, (14) tablets

## 2016-09-06 ENCOUNTER — Ambulatory Visit (HOSPITAL_COMMUNITY)
Admission: RE | Admit: 2016-09-06 | Discharge: 2016-09-06 | Disposition: A | Discharge status: Home / Self Care | Payer: PPO | Source: Ambulatory Visit | Attending: Cardiology | Admitting: Cardiology

## 2016-09-06 DIAGNOSIS — M79609 Pain in unspecified limb: Secondary | ICD-10-CM | POA: Diagnosis not present

## 2016-09-06 DIAGNOSIS — M79662 Pain in left lower leg: Secondary | ICD-10-CM | POA: Diagnosis not present

## 2016-09-07 ENCOUNTER — Telehealth: Payer: Self-pay | Admitting: Nurse Practitioner

## 2016-09-07 NOTE — Telephone Encounter (Signed)
Pt would like to have results from procedure done on 09/06/16 and would like to know if he should continue with the blood thinner or go back to taking the aspirin.

## 2016-09-08 ENCOUNTER — Telehealth: Payer: Self-pay

## 2016-09-08 NOTE — Telephone Encounter (Signed)
-----   Message from Marin Olp, MD sent at 09/08/2016  7:00 AM EDT ----- Regarding: RE: Left calf pain DVT study Runell Kovich please call patient and tell him to stop xarelto because no clot in the leg. Likely calf strain.  Try conservative measures we discussed on avs and see pcp if not improving or worsens.  Can turn this into phone note.  ----- Message ----- From: Freida Busman, RDMS Sent: 09/06/2016  11:50 AM To: Marin Olp, MD Subject: Left calf pain DVT study                       Patient in negative for DVT  Wilkie Aye RVT

## 2016-09-08 NOTE — Telephone Encounter (Signed)
Called and spoke with patient who verbalized understanding. He will follow up with his PCP for further care as needed.

## 2016-09-08 NOTE — Telephone Encounter (Signed)
He was evaluated by Dr. Yong Channel at [place] location. I am unable to access venous doppler results at this time. Until he is contact by dr. Ansel Bong office about results, he should continue xarelto as prescribed. Thank you

## 2016-09-08 NOTE — Telephone Encounter (Signed)
Dr. Yong Channel advised patient can discontinue Xarelto. DVT was negative. I just spoke with patient and advised him tha the can discontinue. He verbalized understanding. He will call his PCP for follow up if needed

## 2016-09-09 ENCOUNTER — Ambulatory Visit (INDEPENDENT_AMBULATORY_CARE_PROVIDER_SITE_OTHER): Payer: PPO | Admitting: Nurse Practitioner

## 2016-09-09 ENCOUNTER — Encounter: Payer: Self-pay | Admitting: Nurse Practitioner

## 2016-09-09 VITALS — BP 124/72 | HR 57 | Temp 98.2°F | Ht 76.0 in | Wt 250.0 lb

## 2016-09-09 DIAGNOSIS — M79662 Pain in left lower leg: Secondary | ICD-10-CM

## 2016-09-09 MED ORDER — ASPIRIN EC 81 MG PO TBEC: 81.0000 mg | DELAYED_RELEASE_TABLET | Freq: Every day | ORAL | Status: DC

## 2016-09-09 NOTE — Patient Instructions (Addendum)
If you have medicare related insurance (such as traditional Medicare, [employer], [employer], or similar), Please make an appointment at the scheduling desk with Sharee Pimple, the [employer], for your Wellness visit in this office, which is a benefit with your insurance.  Normal venous doppler.   Encourage adequate oral hydration.  Wear compression stocking prior to travelling.   do stretches prior to exercise or dancing.

## 2016-09-09 NOTE — Progress Notes (Signed)
Subjective:  Patient ID: Nicholas Rasmussen, male    DOB: Jun 04, 1945  Age: 71 y.o. MRN: [id number]  CC: Pain (both legs pain--calf--wants to know whats going on and med?)   HPI  Nicholas Rasmussen is here to follow up on previous visit with another provider. He was seen by Dr. Marcelo Baldy 09/03/16 with complaint of calf pain, he had recently traveled and was concerned about DVT. Venous doppler was negative for DVT. xarelto was used for 2days, but now discontinued. He now takes ASA 81mg  daily.  calf pain has now resolved.  Outpatient Medications Prior to Visit  Medication Sig Dispense Refill  . divalproex (DEPAKOTE) 250 MG DR tablet TAKE 1 TABLET (250 MG TOTAL) BY MOUTH 2 (TWO) TIMES DAILY. 60 tablet 2  . gabapentin (NEURONTIN) 300 MG capsule Take 1cap in morning and 2caps at bedtime prn (Patient not taking: Reported on 09/03/2016) 90 capsule 0  . aspirin 81 MG tablet Take 81 mg by mouth daily.     No facility-administered medications prior to visit.     ROS See HPI  Objective:  BP 124/72   Pulse (!) 57   Temp 98.2 F (36.8 C)   Ht 6\' 4"  (1.93 m)   Wt 250 lb (113.4 kg)   SpO2 98%   BMI 30.43 kg/m   BP Readings from Last 3 Encounters:  09/09/16 124/72  09/03/16 122/62  08/13/16 122/80    Wt Readings from Last 3 Encounters:  09/09/16 250 lb (113.4 kg)  09/03/16 253 lb 6.4 oz (114.9 kg)  08/13/16 250 lb (113.4 kg)    Physical Exam  Constitutional: He is oriented to person, place, and time. No distress.  Cardiovascular: Normal rate.   Pulmonary/Chest: Effort normal.  Musculoskeletal: Normal range of motion. He exhibits no edema or tenderness.  Neurological: He is alert and oriented to person, place, and time.  Skin: Skin is warm and dry. No rash noted. No erythema.  Psychiatric: He has a normal mood and affect. His behavior is normal.  Vitals reviewed.   Lab Results  Component Value Date   WBC 5.0 04/21/2016   HGB 12.5 (L) 04/21/2016   HCT 39.2 04/21/2016   PLT 183  04/21/2016   GLUCOSE 100 (H) 04/21/2016   CHOL 176 10/28/2015   TRIG 122.0 10/28/2015   HDL 47.50 10/28/2015   LDLCALC 104 (H) 10/28/2015   ALT 10 (L) 04/21/2016   AST 21 04/21/2016   NA 139 04/21/2016   K 3.8 04/21/2016   CL 107 04/21/2016   CREATININE 1.14 04/21/2016   BUN 14 04/21/2016   CO2 27 04/21/2016   TSH 1.08 11/12/2014   HGBA1C 6.2 04/12/2016    No results found.  Assessment & Plan:   Arlene was seen today for pain.  Diagnoses and all orders for this visit:  Pain of left calf  Other orders -     aspirin EC 81 MG tablet; Take 1 tablet (81 mg total) by mouth daily.   I have discontinued Mr. Filip's aspirin. I am also having him start on aspirin EC. Additionally, [name] his divalproex and gabapentin.  Meds ordered this encounter  Medications  . aspirin EC 81 MG tablet    Sig: Take 1 tablet (81 mg total) by mouth daily.    Dispense:  30 tablet    Order Specific Question:   Supervising Provider    Answer:   Cassandria Anger [[id number]]    Follow-up: Return if symptoms worsen or fail to  improve.  [name], NP

## 2016-09-09 NOTE — Progress Notes (Signed)
Pre visit review using our clinic review tool, if applicable. No additional management support is needed unless otherwise documented below in the visit note. 

## 2016-09-21 ENCOUNTER — Other Ambulatory Visit: Payer: Self-pay | Admitting: Internal Medicine

## 2016-11-15 NOTE — Progress Notes (Signed)
Pre visit review using our clinic review tool, if applicable. No additional management support is needed unless otherwise documented below in the visit note. 

## 2016-11-15 NOTE — Progress Notes (Signed)
Subjective:   Nicholas Rasmussen is a 71 y.o. male who presents for Medicare Annual/Subsequent preventive examination.  Review of Systems:  No ROS.  Medicare Wellness Visit. Additional risk factors are reflected in the social history.  Cardiac Risk Factors include: advanced age (>66men, >61 women);male gender Sleep patterns: feels rested on waking, does not get up to void and sleeps 7-8 hours nightly.    Home Safety/Smoke Alarms: Feels safe in home. Smoke alarms in place.  Living environment; residence and Firearm Safety: 1-story house/ trailer, no firearms. Seat Belt Safety/Bike Helmet: Wears seat belt.   Counseling:   Eye Exam- appointment yearly Dental- appointment every 6 months   Male:   CCS-  Last 01/22/14, recall 10 years PSA- No results found for: PSA    Objective:    Vitals: BP 126/78   Pulse (!) 49   Temp 97.9 F (36.6 C)   Ht 6\' 4"  (1.93 m)   Wt 249 lb (112.9 kg)   SpO2 98%   BMI 30.31 kg/m   Body mass index is 30.31 kg/m.  Tobacco History  Smoking Status  . Former Smoker  Smokeless Tobacco  . Never Used     Counseling given: Not Answered   Past Medical History:  Diagnosis Date  . GERD (gastroesophageal reflux disease)   . Post herpetic neuralgia   . Seizures ([place])    Last seizure in the 70s.  . Shingles    Past Surgical History:  Procedure Laterality Date  . KNEE SURGERY Left    left miniscus  . uvala surgery     Family History  Problem Relation Age of Onset  . Diabetes Father   . Hypertension Father   . Other Mother        natural causes   History  Sexual Activity  . Sexual activity: Not on file    Outpatient Encounter Prescriptions as of 11/17/2016  Medication Sig  . aspirin EC 81 MG tablet Take 1 tablet (81 mg total) by mouth daily.  . divalproex (DEPAKOTE) 250 MG DR tablet TAKE 1 TABLET (250 MG TOTAL) BY MOUTH 2 (TWO) TIMES DAILY.  Marland Kitchen Zoster Vac Recomb Adjuvanted [facility]) injection Inject 0.5 mLs into the muscle once.  .  [DISCONTINUED] gabapentin (NEURONTIN) 300 MG capsule Take 1cap in morning and 2caps at bedtime prn (Patient not taking: Reported on 09/03/2016)  . [DISCONTINUED] Zoster Vac Recomb Adjuvanted [facility]) injection Inject 0.5 mLs into the muscle once.   No facility-administered encounter medications on file as of 11/17/2016.     Activities of Daily Living In your present state of health, do you have any difficulty performing the following activities: 11/17/2016  Hearing? N  Vision? N  Difficulty concentrating or making decisions? N  Walking or climbing stairs? N  Dressing or bathing? N  Doing errands, shopping? N  Preparing Food and eating ? N  Using the Toilet? N  In the past six months, have you accidently leaked urine? N  Do you have problems with loss of bowel control? N  Managing your Medications? N  Managing your Finances? N  Housekeeping or managing your Housekeeping? N  Some recent data might be hidden    Patient Care Team: Nche, Charlene Brooke, NP as PCP - General (Internal Medicine)   Assessment:    Physical assessment deferred to PCP.  Exercise Activities and Dietary recommendations Current Exercise Habits: Home exercise routine, Type of exercise: Other - see comments (Bar dancing classes several times weekly), Time (Minutes): 60, Frequency (Times/Week):  4, Weekly Exercise (Minutes/Week): 240, Intensity: Moderate, Exercise limited by: None identified  Diet (meal preparation, eat out, water intake, caffeinated beverages, dairy products, fruits and vegetables): in general, a "healthy" diet  , well balanced, diabetic, low fat/ cholesterol, low salt eats a variety of fruits and vegetables daily, limits salt, fat/cholesterol, sugar, caffeine, drinks 6-8 glasses of water daily.   Goals    . I want to remain healthy as possible and travel          Continue to be active, exercise, eat healthy, enjoy life, family, and travel as much as possible      Fall Risk Fall Risk   11/17/2016 08/13/2016 04/28/2016 03/13/2015  Falls in the past year? No No No No   Depression Screen PHQ 2/9 Scores 11/17/2016 08/13/2016 04/28/2016 03/13/2015  PHQ - 2 Score 0 0 0 0    Cognitive Function       Ad8 score reviewed for issues:  Issues making decisions: no  Less interest in hobbies / activities: no  Repeats questions, stories (family complaining): no  Trouble using ordinary gadgets (microwave, computer, phone):no  Forgets the month or year: no  Mismanaging finances: no  Remembering appts: no  Daily problems with thinking and/or memory: no Ad8 score is= 0  Immunization History  Administered Date(s) Administered  . Influenza Whole 02/21/2014  . Influenza, High Dose Seasonal PF 02/03/2016  . Influenza,inj,Quad PF,36+ Mos 03/13/2015  . Pneumococcal Conjugate-13 05/06/2015  . Pneumococcal Polysaccharide-23 05/07/2014  . Tdap 05/25/2011   Screening Tests Health Maintenance  Topic Date Due  . INFLUENZA VACCINE  12/22/2016  . TETANUS/TDAP  05/24/2021  . COLONOSCOPY  01/23/2024  . Hepatitis C Screening  Completed  . PNA vac Low Risk Adult  Completed      Plan:    Continue doing brain stimulating activities (puzzles, reading, adult coloring books, staying active) to keep memory sharp.   Continue to eat heart healthy diet (full of fruits, vegetables, whole grains, lean protein, water--limit salt, fat, and sugar intake) and increase physical activity as tolerated.  I have personally reviewed and noted the following in the patient's chart:   . Medical and social history . Use of alcohol, tobacco or illicit drugs  . Current medications and supplements . Functional ability and status . Nutritional status . Physical activity . Advanced directives . List of other physicians . Vitals . Screenings to include cognitive, depression, and falls . Referrals and appointments  In addition, I have reviewed and discussed with patient certain preventive protocols,  quality metrics, and best practice recommendations. A written personalized care plan for preventive services as well as general preventive health recommendations were provided to patient.     Michiel Cowboy, RN  11/17/2016

## 2016-11-17 ENCOUNTER — Ambulatory Visit (INDEPENDENT_AMBULATORY_CARE_PROVIDER_SITE_OTHER): Payer: PPO | Admitting: Nurse Practitioner

## 2016-11-17 ENCOUNTER — Encounter: Payer: Self-pay | Admitting: Nurse Practitioner

## 2016-11-17 ENCOUNTER — Other Ambulatory Visit (INDEPENDENT_AMBULATORY_CARE_PROVIDER_SITE_OTHER): Payer: PPO

## 2016-11-17 VITALS — BP 126/78 | HR 49 | Temp 97.9°F | Ht 76.0 in | Wt 249.0 lb

## 2016-11-17 DIAGNOSIS — G40909 Epilepsy, unspecified, not intractable, without status epilepticus: Secondary | ICD-10-CM | POA: Diagnosis not present

## 2016-11-17 DIAGNOSIS — R3911 Hesitancy of micturition: Secondary | ICD-10-CM

## 2016-11-17 DIAGNOSIS — K219 Gastro-esophageal reflux disease without esophagitis: Secondary | ICD-10-CM | POA: Diagnosis not present

## 2016-11-17 DIAGNOSIS — R739 Hyperglycemia, unspecified: Secondary | ICD-10-CM

## 2016-11-17 DIAGNOSIS — Z23 Encounter for immunization: Secondary | ICD-10-CM

## 2016-11-17 DIAGNOSIS — Z136 Encounter for screening for cardiovascular disorders: Secondary | ICD-10-CM | POA: Diagnosis not present

## 2016-11-17 DIAGNOSIS — Z0000 Encounter for general adult medical examination without abnormal findings: Secondary | ICD-10-CM | POA: Diagnosis not present

## 2016-11-17 DIAGNOSIS — Z13220 Encounter for screening for lipoid disorders: Secondary | ICD-10-CM

## 2016-11-17 LAB — CBC
HCT: 41.7 % (ref 39.0–52.0)
Hemoglobin: 13.7 g/dL (ref 13.0–17.0)
MCHC: 32.8 g/dL (ref 30.0–36.0)
MCV: 83.2 fl (ref 78.0–100.0)
Platelets: 187 10*3/uL (ref 150.0–400.0)
RBC: 5.01 Mil/uL (ref 4.22–5.81)
RDW: 14.2 % (ref 11.5–15.5)
WBC: 5.1 10*3/uL (ref 4.0–10.5)

## 2016-11-17 LAB — HEMOGLOBIN A1C: Hgb A1c MFr Bld: 6.3 % (ref 4.6–6.5)

## 2016-11-17 LAB — LIPID PANEL
Cholesterol: 196 mg/dL (ref 0–200)
HDL: 49.4 mg/dL (ref >39.00)
LDL Cholesterol: 128 mg/dL — ABNORMAL HIGH (ref 0–99)
NonHDL: 146.17
Total CHOL/HDL Ratio: 4
Triglycerides: 89 mg/dL (ref 0.0–149.0)
VLDL: 17.8 mg/dL (ref 0.0–40.0)

## 2016-11-17 LAB — PSA: PSA: 0.42 ng/mL (ref 0.10–4.00)

## 2016-11-17 LAB — BASIC METABOLIC PANEL
BUN: 13 mg/dL (ref 6–23)
CO2: 33 mEq/L — ABNORMAL HIGH (ref 19–32)
Calcium: 9.5 mg/dL (ref 8.4–10.5)
Chloride: 103 mEq/L (ref 96–112)
Creatinine, Ser: 1.14 mg/dL (ref 0.40–1.50)
GFR: 81.99 mL/min (ref >60.00)
Glucose, Bld: 106 mg/dL — ABNORMAL HIGH (ref 70–99)
Potassium: 4.3 mEq/L (ref 3.5–5.1)
Sodium: 139 mEq/L (ref 135–145)

## 2016-11-17 LAB — HEPATIC FUNCTION PANEL
ALT: 6 U/L (ref 0–53)
AST: 15 U/L (ref 0–37)
Albumin: 4.2 g/dL (ref 3.5–5.2)
Alkaline Phosphatase: 48 U/L (ref 39–117)
Bilirubin, Direct: 0.1 mg/dL (ref 0.0–0.3)
Total Bilirubin: 0.4 mg/dL (ref 0.2–1.2)
Total Protein: 7.1 g/dL (ref 6.0–8.3)

## 2016-11-17 MED ORDER — ZOSTER VAC RECOMB ADJUVANTED 50 MCG/0.5ML IM SUSR: 0.5000 mL | Freq: Once | INTRAMUSCULAR | 0 refills | Status: DC

## 2016-11-17 MED ORDER — ZOSTER VAC RECOMB ADJUVANTED 50 MCG/0.5ML IM SUSR: 0.5000 mL | Freq: Once | INTRAMUSCULAR | 0 refills | Status: AC

## 2016-11-17 MED ORDER — DIVALPROEX SODIUM 250 MG PO DR TAB: DELAYED_RELEASE_TABLET | ORAL | 1 refills | Status: DC

## 2016-11-17 NOTE — Progress Notes (Signed)
Subjective:  Patient ID: Nicholas Rasmussen, male    DOB: [birth year]  Age: 71 y.o. MRN: [id number]  CC: Annual Exam (CPE/see Nicholas Rasmussen at 10) and Medicare Wellness  Medical screening examination/treatment/procedure(s) were performed by he Nicholas Rasmussen, Therapist, sports. As primary care provider I was immediately available for consulation/collaboration. I agree with above documentation. Nicholas Rasmussen, AGNP-C  Urinary Frequency   This is a chronic problem. The current episode started more than 1 year ago. The problem occurs every urination. The problem has been unchanged. The pain is at a severity of 0/10. The patient is experiencing no pain. There has been no fever. He is sexually active. There is no history of pyelonephritis. Associated symptoms include frequency and hesitancy. Pertinent negatives include no chills, discharge, flank pain, hematuria, nausea, possible pregnancy, sweats, urgency or vomiting. Associated symptoms comments: Nocturnal urination once per day. He has tried nothing for the symptoms. There is no history of catheterization, kidney stones, recurrent UTIs, a single kidney, urinary stasis or a urological procedure.    Seizure: Denies any seizure activity. No adverse effects with depakote.  Fall Risk  11/17/2016 08/13/2016 04/28/2016 03/13/2015  Falls in the past year? No No No No    Depression screen [facility] 2/9 11/17/2016 08/13/2016 04/28/2016 03/13/2015  Decreased Interest 0 0 0 0  Down, Depressed, Hopeless 0 0 0 0  PHQ - 2 Score 0 0 0 0   Outpatient Medications Prior to Visit  Medication Sig Dispense Refill  . aspirin EC 81 MG tablet Take 1 tablet (81 mg total) by mouth daily. 30 tablet   . divalproex (DEPAKOTE) 250 MG DR tablet TAKE 1 TABLET (250 MG TOTAL) BY MOUTH 2 (TWO) TIMES DAILY. 60 tablet 2  . gabapentin (NEURONTIN) 300 MG capsule Take 1cap in morning and 2caps at bedtime prn (Patient not taking: Reported on 09/03/2016) 90 capsule 0   No facility-administered medications prior to  visit.    Past Medical History:  Diagnosis Date  . GERD (gastroesophageal reflux disease)   . Post herpetic neuralgia   . Seizures ([place])    Last seizure in the 70s.  . Shingles    Social History   Social History  . Marital status: Single    Spouse name: N/A  . Number of children: 3  . Years of education: Associates   Occupational History  . Retired    Social History Main Topics  . Smoking status: Former Research scientist (life sciences)  . Smokeless tobacco: Never Used  . Alcohol use No  . Drug use: No  . Sexual activity: Not on file   Other Topics Concern  . Not on file   Social History Narrative   Lives alone.   Right-handed.   Rarely drinks caffeine.      ROS Review of Systems  Constitutional: Negative for chills, fever, malaise/fatigue and weight loss.  HENT: Negative for congestion and sore throat.   Eyes: Negative for blurred vision.       Negative for visual changes  Respiratory: Negative for cough and shortness of breath.   Cardiovascular: Negative for chest pain, palpitations and leg swelling.  Gastrointestinal: Negative for blood in stool, constipation, diarrhea, heartburn, nausea and vomiting.  Genitourinary: Positive for frequency and hesitancy. Negative for dysuria, flank pain, hematuria and urgency.  Musculoskeletal: Negative for falls, joint pain and myalgias.  Skin: Negative for rash.  Neurological: Negative for dizziness, sensory change and headaches.  Endo/Heme/Allergies: Does not bruise/bleed easily.  Psychiatric/Behavioral: Negative for depression, substance abuse and suicidal ideas. The patient is  not nervous/anxious.      Objective:  BP 126/78   Pulse (!) 49   Temp 97.9 F (36.6 C)   Ht 6\' 4"  (1.93 m)   Wt 249 lb (112.9 kg)   SpO2 98%   BMI 30.31 kg/m   BP Readings from Last 3 Encounters:  11/17/16 126/78  09/09/16 124/72  09/03/16 122/62    Wt Readings from Last 3 Encounters:  11/17/16 249 lb (112.9 kg)  09/09/16 250 lb (113.4 kg)  09/03/16 253  lb 6.4 oz (114.9 kg)    Physical Exam  Constitutional: He is oriented to person, place, and time. No distress.  HENT:  Right Ear: External ear normal.  Left Ear: External ear normal.  Nose: Nose normal.  Mouth/Throat: Oropharynx is clear and moist. No oropharyngeal exudate.  Eyes: Conjunctivae and EOM are normal. Pupils are equal, round, and reactive to light. No scleral icterus.  Neck: Normal range of motion. Neck supple.  Cardiovascular: Normal rate, regular rhythm and normal heart sounds.   Pulmonary/Chest: Effort normal and breath sounds normal.  Abdominal: Soft. Bowel sounds are normal. He exhibits no distension. There is no tenderness.  Musculoskeletal: He exhibits no edema or tenderness.  Lymphadenopathy:    He has no cervical adenopathy.  Neurological: He is alert and oriented to person, place, and time. No cranial nerve deficit. Coordination normal.  Skin: Skin is warm and dry. No rash noted. No erythema.  Vitals reviewed.   Lab Results  Component Value Date   WBC 5.1 11/17/2016   HGB 13.7 11/17/2016   HCT 41.7 11/17/2016   PLT 187.0 11/17/2016   GLUCOSE 106 (H) 11/17/2016   CHOL 196 11/17/2016   TRIG 89.0 11/17/2016   HDL 49.40 11/17/2016   LDLCALC 128 (H) 11/17/2016   ALT 6 11/17/2016   AST 15 11/17/2016   NA 139 11/17/2016   K 4.3 11/17/2016   CL 103 11/17/2016   CREATININE 1.14 11/17/2016   BUN 13 11/17/2016   CO2 33 (H) 11/17/2016   TSH 1.08 11/12/2014   PSA 0.42 11/17/2016   HGBA1C 6.3 11/17/2016    No results found.  Assessment & Plan:   Nicholas Rasmussen was seen today for annual exam and medicare wellness.  Diagnoses and all orders for this visit:  Encounter for preventive health examination -     Basic metabolic panel; Future -     Hepatic function panel; Future -     CBC; Future  Seizure disorder (HCC) -     Valproic Acid level; Future  Encounter for lipid screening for cardiovascular disease -     Lipid panel; Future  Gastroesophageal reflux  disease, esophagitis presence not specified  Urinary hesitancy -     PSA; Future  Hyperglycemia -     Hemoglobin A1c; Future  Encounter for Medicare annual wellness exam  Need for viral immunization -     Zoster Vac Recomb Adjuvanted [facility]) injection; Inject 0.5 mLs into the muscle once.  Other orders -     Discontinue: Zoster Vac Recomb Adjuvanted [facility]) injection; Inject 0.5 mLs into the muscle once.   I have discontinued Mr. Amirault's gabapentin. I am also having him maintain his aspirin EC, divalproex, and Zoster Vac Recomb Adjuvanted.  Meds ordered this encounter  Medications  . DISCONTD: Zoster Vac Recomb Adjuvanted [facility]) injection    Sig: Inject 0.5 mLs into the muscle once.    Dispense:  0.5 mL    Refill:  0    Order Specific Question:  Supervising Provider    Answer:   Cassandria Anger [[id number]]  . Zoster Vac Recomb Adjuvanted [facility]) injection    Sig: Inject 0.5 mLs into the muscle once.    Dispense:  0.5 mL    Refill:  0    Order Specific Question:   Supervising Provider    Answer:   Cassandria Anger [[id number]]    Follow-up: Return if symptoms worsen or fail to improve.  Nicholas Lacy, NP

## 2016-11-17 NOTE — Patient Instructions (Addendum)
Go to basement blood draw. Health Maintenance, Male A healthy lifestyle and preventive care is important for your health and wellness. Ask your health care provider about what schedule of regular examinations is right for you. What should I know about weight and diet? Eat a Healthy Diet  Eat plenty of vegetables, fruits, whole grains, low-fat dairy products, and lean protein.  Do not eat a lot of foods high in solid fats, added sugars, or salt.  Maintain a Healthy Weight Regular exercise can help you achieve or maintain a healthy weight. You should:  Do at least 150 minutes of exercise each week. The exercise should increase your heart rate and make you sweat (moderate-intensity exercise).  Do strength-training exercises at least twice a week.  Watch Your Levels of Cholesterol and Blood Lipids  Have your blood tested for lipids and cholesterol every 5 years starting at 69 years of age. If you are at high risk for heart disease, you should start having your blood tested when you are 69 years old. You may need to have your cholesterol levels checked more often if: ? Your lipid or cholesterol levels are high. ? You are older than 69 years of age. ? You are at high risk for heart disease.  What should I know about cancer screening? Many types of cancers can be detected early and may often be prevented. Lung Cancer  You should be screened every year for lung cancer if: ? You are a current smoker who has smoked for at least 30 years. ? You are a former smoker who has quit within the past 15 years.  Talk to your health care provider about your screening options, when you should start screening, and how often you should be screened.  Colorectal Cancer  Routine colorectal cancer screening usually begins at 69 years of age and should be repeated every 5-10 years until you are 69 years old. You may need to be screened more often if early forms of precancerous polyps or small growths are  found. Your health care provider may recommend screening at an earlier age if you have risk factors for colon cancer.  Your health care provider may recommend using home test kits to check for hidden blood in the stool.  A small camera at the end of a tube can be used to examine your colon (sigmoidoscopy or colonoscopy). This checks for the earliest forms of colorectal cancer.  Prostate and Testicular Cancer  Depending on your age and overall health, your health care provider may do certain tests to screen for prostate and testicular cancer.  Talk to your health care provider about any symptoms or concerns you have about testicular or prostate cancer.  Skin Cancer  Check your skin from head to toe regularly.  Tell your health care provider about any new moles or changes in moles, especially if: ? There is a change in a mole's size, shape, or color. ? You have a mole that is larger than a pencil eraser.  Always use sunscreen. Apply sunscreen liberally and repeat throughout the day.  Protect yourself by wearing long sleeves, pants, a wide-brimmed hat, and sunglasses when outside.  What should I know about heart disease, diabetes, and high blood pressure?  If you are 40-36 years of age, have your blood pressure checked every 3-5 years. If you are 62 years of age or older, have your blood pressure checked every year. You should have your blood pressure measured twice-once when you are at a hospital  or clinic, and once when you are not at a hospital or clinic. Record the average of the two measurements. To check your blood pressure when you are not at a hospital or clinic, you can use: ? An automated blood pressure machine at a pharmacy. ? A home blood pressure monitor.  Talk to your health care provider about your target blood pressure.  If you are between 66-77 years old, ask your health care provider if you should take aspirin to prevent heart disease.  Have regular diabetes  screenings by checking your fasting blood sugar level. ? If you are at a normal weight and have a low risk for diabetes, have this test once every three years after the age of 39. ? If you are overweight and have a high risk for diabetes, consider being tested at a younger age or more often.  A one-time screening for abdominal aortic aneurysm (AAA) by ultrasound is recommended for men aged 15-75 years who are current or former smokers. What should I know about preventing infection? Hepatitis B If you have a higher risk for hepatitis B, you should be screened for this virus. Talk with your health care provider to find out if you are at risk for hepatitis B infection. Hepatitis C Blood testing is recommended for:  Everyone born from [birth year] through 1963.  Anyone with known risk factors for hepatitis C.  Sexually Transmitted Diseases (STDs)  You should be screened each year for STDs including gonorrhea and chlamydia if: ? You are sexually active and are younger than 69 years of age. ? You are older than 69 years of age and your health care provider tells you that you are at risk for this type of infection. ? Your sexual activity has changed since you were last screened and you are at an increased risk for chlamydia or gonorrhea. Ask your health care provider if you are at risk.  Talk with your health care provider about whether you are at high risk of being infected with HIV. Your health care provider may recommend a prescription medicine to help prevent HIV infection.  What else can I do?  Schedule regular health, dental, and eye exams.  Stay current with your vaccines (immunizations).  Do not use any tobacco products, such as cigarettes, chewing tobacco, and e-cigarettes. If you need help quitting, ask your health care provider.  Limit alcohol intake to no more than 2 drinks per day. One drink equals 12 ounces of beer, 5 ounces of wine, or 1 ounces of hard liquor.  Do not use street  drugs.  Do not share needles.  Ask your health care provider for help if you need support or information about quitting drugs.  Tell your health care provider if you often feel depressed.  Tell your health care provider if you have ever been abused or do not feel safe at home. This information is not intended to replace advice given to you by your health care provider. Make sure you discuss any questions you have with your health care provider. Document Released: 11/06/2007 Document Revised: 01/07/2016 Document Reviewed: 02/11/2015 Elsevier Interactive Patient Education  2018 [place] doing brain stimulating activities (puzzles, reading, adult coloring books, staying active) to keep memory sharp.   Continue to eat heart healthy diet (full of fruits, vegetables, whole grains, lean protein, water--limit salt, fat, and sugar intake) and increase physical activity as tolerated. Diabetes Mellitus and Food It is important for you to manage your blood sugar (glucose)  level. Your blood glucose level can be greatly affected by what you eat. Eating healthier foods in the appropriate amounts throughout the day at about the same time each day will help you control your blood glucose level. It can also help slow or prevent worsening of your diabetes mellitus. Healthy eating may even help you improve the level of your blood pressure and reach or maintain a healthy weight. General recommendations for healthful eating and cooking habits include:  Eating meals and snacks regularly. Avoid going long periods of time without eating to lose weight.  Eating a diet that consists mainly of plant-based foods, such as fruits, vegetables, nuts, legumes, and whole grains.  Using low-heat cooking methods, such as baking, instead of high-heat cooking methods, such as deep frying.  Work with your dietitian to make sure you understand how to use the Nutrition Facts information on food labels. How can food  affect me? Carbohydrates Carbohydrates affect your blood glucose level more than any other type of food. Your dietitian will help you determine how many carbohydrates to eat at each meal and teach you how to count carbohydrates. Counting carbohydrates is important to keep your blood glucose at a healthy level, especially if you are using insulin or taking certain medicines for diabetes mellitus. Alcohol Alcohol can cause sudden decreases in blood glucose (hypoglycemia), especially if you use insulin or take certain medicines for diabetes mellitus. Hypoglycemia can be a life-threatening condition. Symptoms of hypoglycemia (sleepiness, dizziness, and disorientation) are similar to symptoms of having too much alcohol. If your health care provider has given you approval to drink alcohol, do so in moderation and use the following guidelines:  Women should not have more than one drink per day, and men should not have more than two drinks per day. One drink is equal to: ? 12 oz of beer. ? 5 oz of wine. ? 1 oz of hard liquor.  Do not drink on an empty stomach.  Keep yourself hydrated. Have water, diet soda, or unsweetened iced tea.  Regular soda, juice, and other mixers might contain a lot of carbohydrates and should be counted.  What foods are not recommended? As you make food choices, it is important to remember that all foods are not the same. Some foods have fewer nutrients per serving than other foods, even though they might have the same number of calories or carbohydrates. It is difficult to get your body what it needs when you eat foods with fewer nutrients. Examples of foods that you should avoid that are high in calories and carbohydrates but low in nutrients include:  Trans fats (most processed foods list trans fats on the Nutrition Facts label).  Regular soda.  Juice.  Candy.  Sweets, such as cake, pie, doughnuts, and cookies.  Fried foods.  What foods can I eat? Eat  nutrient-rich foods, which will nourish your body and keep you healthy. The food you should eat also will depend on several factors, including:  The calories you need.  The medicines you take.  Your weight.  Your blood glucose level.  Your blood pressure level.  Your cholesterol level.  You should eat a variety of foods, including:  Protein. ? Lean cuts of meat. ? Proteins low in saturated fats, such as fish, egg whites, and beans. Avoid processed meats.  Fruits and vegetables. ? Fruits and vegetables that may help control blood glucose levels, such as apples, mangoes, and yams.  Dairy products. ? Choose fat-free or low-fat dairy products, such  as milk, yogurt, and cheese.  Grains, bread, pasta, and rice. ? Choose whole grain products, such as multigrain bread, whole oats, and brown rice. These foods may help control blood pressure.  Fats. ? Foods containing healthful fats, such as nuts, avocado, olive oil, canola oil, and fish.  Does everyone with diabetes mellitus have the same meal plan? Because every person with diabetes mellitus is different, there is not one meal plan that works for everyone. It is very important that you meet with a dietitian who will help you create a meal plan that is just right for you. This information is not intended to replace advice given to you by your health care provider. Make sure you discuss any questions you have with your health care provider. Document Released: 02/04/2005 Document Revised: 10/16/2015 Document Reviewed: 04/06/2013 Elsevier Interactive Patient Education  2017 [place] for Diabetes Mellitus, Adult Carbohydrate counting is a method for keeping track of how many carbohydrates you eat. Eating carbohydrates naturally increases the amount of sugar (glucose) in the blood. Counting how many carbohydrates you eat helps keep your blood glucose within normal limits, which helps you manage your diabetes  (diabetes mellitus). It is important to know how many carbohydrates you can safely have in each meal. This is different for every person. A diet and nutrition specialist (registered dietitian) can help you make a meal plan and calculate how many carbohydrates you should have at each meal and snack. Carbohydrates are found in the following foods:  Grains, such as breads and cereals.  Dried beans and soy products.  Starchy vegetables, such as potatoes, peas, and corn.  Fruit and fruit juices.  Milk and yogurt.  Sweets and snack foods, such as cake, cookies, candy, chips, and soft drinks.  How do I count carbohydrates? There are two ways to count carbohydrates in food. You can use either of the methods or a combination of both. Reading "Nutrition Facts" on packaged food The "Nutrition Facts" list is included on the labels of almost all packaged foods and beverages in the [place] It includes:  The serving size.  Information about nutrients in each serving, including the grams (g) of carbohydrate per serving.  To use the "Nutrition Facts":  Decide how many servings you will have.  Multiply the number of servings by the number of carbohydrates per serving.  The resulting number is the total amount of carbohydrates that you will be having.  Learning standard serving sizes of other foods When you eat foods containing carbohydrates that are not packaged or do not include "Nutrition Facts" on the label, you need to measure the servings in order to count the amount of carbohydrates:  Measure the foods that you will eat with a food scale or measuring cup, if needed.  Decide how many standard-size servings you will eat.  Multiply the number of servings by 15. Most carbohydrate-rich foods have about 15 g of carbohydrates per serving. ? For example, if you eat 8 oz (170 g) of strawberries, you will have eaten 2 servings and 30 g of carbohydrates (2 servings x 15 g = 30 g).  For foods that  have more than one food mixed, such as soups and casseroles, you must count the carbohydrates in each food that is included.  The following list contains standard serving sizes of common carbohydrate-rich foods. Each of these servings has about 15 g of carbohydrates:   hamburger bun or  English muffin.   oz (15 mL) syrup.  oz (14 g) jelly.  1 slice of bread.  1 six-inch tortilla.  3 oz (85 g) cooked rice or pasta.  4 oz (113 g) cooked dried beans.  4 oz (113 g) starchy vegetable, such as peas, corn, or potatoes.  4 oz (113 g) hot cereal.  4 oz (113 g) mashed potatoes or  of a large baked potato.  4 oz (113 g) canned or frozen fruit.  4 oz (120 mL) fruit juice.  4-6 crackers.  6 chicken nuggets.  6 oz (170 g) unsweetened dry cereal.  6 oz (170 g) plain fat-free yogurt or yogurt sweetened with artificial sweeteners.  8 oz (240 mL) milk.  8 oz (170 g) fresh fruit or one small piece of fruit.  24 oz (680 g) popped popcorn.  Example of carbohydrate counting Sample meal  3 oz (85 g) chicken breast.  6 oz (170 g) brown rice.  4 oz (113 g) corn.  8 oz (240 mL) milk.  8 oz (170 g) strawberries with sugar-free whipped topping. Carbohydrate calculation 1. Identify the foods that contain carbohydrates: ? Rice. ? Corn. ? Milk. ? Strawberries. 2. Calculate how many servings you have of each food: ? 2 servings rice. ? 1 serving corn. ? 1 serving milk. ? 1 serving strawberries. 3. Multiply each number of servings by 15 g: ? 2 servings rice x 15 g = 30 g. ? 1 serving corn x 15 g = 15 g. ? 1 serving milk x 15 g = 15 g. ? 1 serving strawberries x 15 g = 15 g. 4. Add together all of the amounts to find the total grams of carbohydrates eaten: ? 30 g + 15 g + 15 g + 15 g = 75 g of carbohydrates total. This information is not intended to replace advice given to you by your health care provider. Make sure you discuss any questions you have with your health care  provider. Document Released: 05/10/2005 Document Revised: 11/28/2015 Document Reviewed: 10/22/2015 Elsevier Interactive Patient Education  2018 [place] Eating Plan DASH stands for "Dietary Approaches to Stop Hypertension." The DASH eating plan is a healthy eating plan that has been shown to reduce high blood pressure (hypertension). It may also reduce your risk for type 2 diabetes, heart disease, and stroke. The DASH eating plan may also help with weight loss. What are tips for following this plan? General guidelines  Avoid eating more than 2,300 mg (milligrams) of salt (sodium) a day. If you have hypertension, you may need to reduce your sodium intake to 1,500 mg a day.  Limit alcohol intake to no more than 1 drink a day for nonpregnant women and 2 drinks a day for men. One drink equals 12 oz of beer, 5 oz of wine, or 1 oz of hard liquor.  Work with your health care provider to maintain a healthy body weight or to lose weight. Ask what an ideal weight is for you.  Get at least 30 minutes of exercise that causes your heart to beat faster (aerobic exercise) most days of the week. Activities may include walking, swimming, or biking.  Work with your health care provider or diet and nutrition specialist (dietitian) to adjust your eating plan to your individual calorie needs. Reading food labels  Check food labels for the amount of sodium per serving. Choose foods with less than 5 percent of the Daily Value of sodium. Generally, foods with less than 300 mg of sodium per serving fit into  this eating plan.  To find whole grains, look for the word "whole" as the first word in the ingredient list. Shopping  Buy products labeled as "low-sodium" or "no salt added."  Buy fresh foods. Avoid canned foods and premade or frozen meals. Cooking  Avoid adding salt when cooking. Use salt-free seasonings or herbs instead of table salt or sea salt. Check with your health care provider or  pharmacist before using salt substitutes.  Do not fry foods. Cook foods using healthy methods such as baking, boiling, grilling, and broiling instead.  Cook with heart-healthy oils, such as olive, canola, soybean, or sunflower oil. Meal planning   Eat a balanced diet that includes: ? 5 or more servings of fruits and vegetables each day. At each meal, try to fill half of your plate with fruits and vegetables. ? Up to 6-8 servings of whole grains each day. ? Less than 6 oz of lean meat, poultry, or fish each day. A 3-oz serving of meat is about the same size as a deck of cards. One egg equals 1 oz. ? 2 servings of low-fat dairy each day. ? A serving of nuts, seeds, or beans 5 times each week. ? Heart-healthy fats. Healthy fats called Omega-3 fatty acids are found in foods such as flaxseeds and coldwater fish, like sardines, salmon, and mackerel.  Limit how much you eat of the following: ? Canned or prepackaged foods. ? Food that is high in trans fat, such as fried foods. ? Food that is high in saturated fat, such as fatty meat. ? Sweets, desserts, sugary drinks, and other foods with added sugar. ? Full-fat dairy products.  Do not salt foods before eating.  Try to eat at least 2 vegetarian meals each week.  Eat more home-cooked food and less restaurant, buffet, and fast food.  When eating at a restaurant, ask that your food be prepared with less salt or no salt, if possible. What foods are recommended? The items listed may not be a complete list. Talk with your dietitian about what dietary choices are best for you. Grains Whole-grain or whole-wheat bread. Whole-grain or whole-wheat pasta. Brown rice. [place]. Bulgur. Whole-grain and low-sodium cereals. Pita bread. Low-fat, low-sodium crackers. Whole-wheat flour tortillas. Vegetables Fresh or frozen vegetables (raw, steamed, roasted, or grilled). Low-sodium or reduced-sodium tomato and vegetable juice. Low-sodium or  reduced-sodium tomato sauce and tomato paste. Low-sodium or reduced-sodium canned vegetables. Fruits All fresh, dried, or frozen fruit. Canned fruit in natural juice (without added sugar). Meat and other protein foods Skinless chicken or [place]. Ground chicken or [place]. Pork with fat trimmed off. Fish and seafood. Egg whites. Dried beans, peas, or lentils. Unsalted nuts, nut butters, and seeds. Unsalted canned beans. Lean cuts of beef with fat trimmed off. Low-sodium, lean deli meat. Dairy Low-fat (1%) or fat-free (skim) milk. Fat-free, low-fat, or reduced-fat cheeses. Nonfat, low-sodium ricotta or cottage cheese. Low-fat or nonfat yogurt. Low-fat, low-sodium cheese. Fats and oils Soft margarine without trans fats. Vegetable oil. Low-fat, reduced-fat, or light mayonnaise and salad dressings (reduced-sodium). Canola, safflower, olive, soybean, and sunflower oils. Avocado. Seasoning and other foods Herbs. Spices. Seasoning mixes without salt. Unsalted popcorn and pretzels. Fat-free sweets. What foods are not recommended? The items listed may not be a complete list. Talk with your dietitian about what dietary choices are best for you. Grains Baked goods made with fat, such as croissants, muffins, or some breads. Dry pasta or rice meal packs. Vegetables Creamed or fried vegetables. Vegetables in a cheese  sauce. Regular canned vegetables (not low-sodium or reduced-sodium). Regular canned tomato sauce and paste (not low-sodium or reduced-sodium). Regular tomato and vegetable juice (not low-sodium or reduced-sodium). Angie Fava. Olives. Fruits Canned fruit in a light or heavy syrup. Fried fruit. Fruit in cream or butter sauce. Meat and other protein foods Fatty cuts of meat. Ribs. Fried meat. Berniece Salines. Sausage. Bologna and other processed lunch meats. Salami. Fatback. Hotdogs. Bratwurst. Salted nuts and seeds. Canned beans with added salt. Canned or smoked fish. Whole eggs or egg yolks. Chicken or [place]  with skin. Dairy Whole or 2% milk, cream, and half-and-half. Whole or full-fat cream cheese. Whole-fat or sweetened yogurt. Full-fat cheese. Nondairy creamers. Whipped toppings. Processed cheese and cheese spreads. Fats and oils Butter. Stick margarine. Lard. Shortening. Ghee. Bacon fat. Tropical oils, such as coconut, palm kernel, or palm oil. Seasoning and other foods Salted popcorn and pretzels. Onion salt, garlic salt, seasoned salt, table salt, and sea salt. Worcestershire sauce. Tartar sauce. Barbecue sauce. Teriyaki sauce. Soy sauce, including reduced-sodium. Steak sauce. Canned and packaged gravies. Fish sauce. Oyster sauce. Cocktail sauce. Horseradish that you find on the shelf. Ketchup. Mustard. Meat flavorings and tenderizers. Bouillon cubes. Hot sauce and Tabasco sauce. Premade or packaged marinades. Premade or packaged taco seasonings. Relishes. Regular salad dressings. Where to find more information:  National Heart, Lung, and [place]: https://wilson-eaton.com/  American Heart Association: www.heart.org Summary  The DASH eating plan is a healthy eating plan that has been shown to reduce high blood pressure (hypertension). It may also reduce your risk for type 2 diabetes, heart disease, and stroke.  With the DASH eating plan, you should limit salt (sodium) intake to 2,300 mg a day. If you have hypertension, you may need to reduce your sodium intake to 1,500 mg a day.  When on the DASH eating plan, aim to eat more fresh fruits and vegetables, whole grains, lean proteins, low-fat dairy, and heart-healthy fats.  Work with your health care provider or diet and nutrition specialist (dietitian) to adjust your eating plan to your individual calorie needs. This information is not intended to replace advice given to you by your health care provider. Make sure you discuss any questions you have with your health care provider. Document Released: 04/29/2011 Document Revised: 05/03/2016  Document Reviewed: 05/03/2016 Elsevier Interactive Patient Education  2017 [place] that are packaged or in containers have a Nutrition Facts panel on the side or back. This is commonly called the food label. The food label helps you make healthy food choices by providing information about serving size and the amount of calories and various nutrients in the food. You can check the food label to find out if the food contains high or low amounts of nutrients that you want to limit in your diet. You can also use the food label to see if the food is a good source of the nutrients that you want to make sure are included in your diet. How do I read the food label?  Begin by checking the serving size and number of servings in the container. All of the nutrition information listed on the food label is based on one serving. If you eat more than one serving, you must multiply the amounts (such as calories, grams of saturated fat, or milligrams of sodium) by the number of servings.  Check the calories. Choosing foods that are low in calories can help you manage your weight.  Look at the numbers in the % Daily  Value column for each listed nutrient. This gives you an idea of how much of the daily recommended amount for that nutrient is provided in one serving of the food. A daily value of 5% or less is considered low. A daily value of 20% or more is considered high.  Check the amounts for the items you should limit in your diet. These include: ? Total fat. ? Saturated fat. ? Trans fat. ? Cholesterol. ? Sodium.  Check the amounts for the items you should make sure you get enough of. These include: ? Dietary fiber. ? Vitamins A and C. ? Calcium. ? Iron. What information is provided on the food label? Serving information  Serving size. ? The serving size is listed in cups or pieces. The nutrient amounts listed on the food label apply to this amount of the food.  Servings  per container or package. ? This shows the number of servings you can expect to get from the container or package if you follow the suggested serving size. Amount per serving  Calories. ? The number of calories in one serving of the food. This information is helpful in managing weight. Low-calorie foods contain 40 calories or less. High-calorie foods contain 400 or more calories.  Calories from fat. ? The number of calories that come from fat in one serving. Percent daily value Percent daily value (shown on the label as % Daily Value) tells you what percent of the daily value for each nutrient one serving provides. The daily value is the recommended amount of the nutrient that you should get each day. For example, if 15% is listed next to dietary fiber, it means that one serving of the food will give you 15% of the recommended amount of fiber that you should get in a day. The daily values are based on a 2,000-calorie-per-day diet. You may get more or less than 2,000 calories in your diet each day, but the % Daily Value gives you an idea of whether the food contains a high or low amount of the listed nutrient. A daily value of 5% or less is low. A daily value of 20% or more is high. Total fat Total fat shows you the total amount of fat in one serving (listed in grams). Foods with high amounts of fat usually have higher calories and may lead to weight gain. Two of the fats that make up a portion of the total fat are included on the label:  Saturated fat. ? This number is the amount of saturated fat in one serving (listed in grams). Saturated fat increases the amount of blood cholesterol and should be limited to less than 7% of total calories each day. This means that if you eat 2,000 calories each day, you should eat less than 140 calories from saturated fat.    Cholesterol The amount of cholesterol in one serving is listed in milligrams. Cholesterol should be limited to no more than 300 mg each  day. Sodium The amount of sodium in one serving is listed in milligrams. Most people should limit their sodium intake to 2,300 mg a day. Total carbohydrate This number shows the amount of total carbohydrate in one serving (listed in grams). This information can help people with diabetes manage the amount of carbohydrate they eat. Two of the carbohydrates that make up a portion of the total carbohydrate are included on the label:  Dietary fiber. ? The amount of dietary fiber in one serving is listed in grams. Most people  should eat at least 25 g of dietary fiber each day.  Sugars. ? The amount of sugar in one serving is also listed in grams. This value includes both naturally occurring sugars from fruit and milk and added sugars such as honey or table sugar.  Protein The amount of protein in one serving is listed in grams. What other important labeling is on the food package? Ingredients Food labels will list each ingredient in the food. The first ingredient listed is the ingredient that the food has the most of. The ingredients are listed in the order of their amount by weight from highest to lowest. Food allergen labeling Food labels may also include a food allergen warning. Listed here are ingredients that can cause allergic reactions in some people. The potential allergens are listed behind the word "Contains" or "May contain." Examples of ingredients that may be listed are wheat, dairy, eggs, soy, and nuts. If a person knows that he or she is allergic to one of these ingredients, he or she will know to avoid that food. Where to find more information:  U.S. Food and Drug Administration: [place] This information is not intended to replace advice given to you by your health care provider. Make sure you discuss any questions you have with your health care provider. Document Released: 05/10/2005 Document Revised: 01/07/2016 Document Reviewed: 04/02/2013 Elsevier Interactive Patient  Education  2017 [employer].

## 2016-11-18 ENCOUNTER — Telehealth: Payer: Self-pay | Admitting: Nurse Practitioner

## 2016-11-18 DIAGNOSIS — B029 Zoster without complications: Secondary | ICD-10-CM

## 2016-11-18 LAB — VALPROIC ACID LEVEL: Valproic Acid Lvl: 45.2 mg/L — ABNORMAL LOW (ref 50.0–100.0)

## 2016-11-18 MED ORDER — VALACYCLOVIR HCL 1 G PO TABS: 1000.0000 mg | ORAL_TABLET | Freq: Three times a day (TID) | ORAL | 0 refills | Status: DC

## 2016-11-18 MED ORDER — GABAPENTIN 300 MG PO CAPS: ORAL_CAPSULE | ORAL | 0 refills | Status: DC

## 2016-11-18 NOTE — Telephone Encounter (Signed)
Patient states sumatriptan, gabapentin and prednisone worked well with his last outbreak of this same thing in march/2018---fyi...routing to [place]----patient can't get shingles shot at pharmacy until he gets this episode cleared up

## 2016-11-18 NOTE — Telephone Encounter (Signed)
Pt would like a call back, he was supposed to go get the shingirix vaccine today but he think he is getting the shingles again, he has a headache and a couple bumps on his forehead.  Please advise he would like a call back as what to do

## 2016-11-18 NOTE — Telephone Encounter (Signed)
Pt is aware.  

## 2016-11-18 NOTE — Telephone Encounter (Signed)
Pt stated he is getting a headache,rash and bumps on left forehead now, same feeling when he has the shingle on the other side. Can we give him something to help, please advise.

## 2017-01-19 ENCOUNTER — Emergency Department (HOSPITAL_COMMUNITY)
Admission: EM | Admit: 2017-01-19 | Discharge: 2017-01-19 | Disposition: A | Discharge status: Home / Self Care | Payer: PPO | Attending: Emergency Medicine | Admitting: Emergency Medicine

## 2017-01-19 ENCOUNTER — Emergency Department (HOSPITAL_COMMUNITY): Payer: PPO

## 2017-01-19 DIAGNOSIS — Z7982 Long term (current) use of aspirin: Secondary | ICD-10-CM | POA: Insufficient documentation

## 2017-01-19 DIAGNOSIS — R079 Chest pain, unspecified: Secondary | ICD-10-CM | POA: Diagnosis not present

## 2017-01-19 DIAGNOSIS — R0789 Other chest pain: Secondary | ICD-10-CM | POA: Diagnosis not present

## 2017-01-19 DIAGNOSIS — K2100 Gastro-esophageal reflux disease with esophagitis, without bleeding: Secondary | ICD-10-CM

## 2017-01-19 DIAGNOSIS — Z87891 Personal history of nicotine dependence: Secondary | ICD-10-CM | POA: Diagnosis not present

## 2017-01-19 DIAGNOSIS — Z79899 Other long term (current) drug therapy: Secondary | ICD-10-CM | POA: Diagnosis not present

## 2017-01-19 LAB — BASIC METABOLIC PANEL
Anion gap: 7 (ref 5–15)
BUN: 17 mg/dL (ref 6–20)
CO2: 30 mmol/L (ref 22–32)
Calcium: 9.4 mg/dL (ref 8.9–10.3)
Chloride: 102 mmol/L (ref 101–111)
Creatinine, Ser: 1.11 mg/dL (ref 0.61–1.24)
GFR calc Af Amer: >60 mL/min (ref >60)
GFR calc non Af Amer: >60 mL/min (ref >60)
Glucose, Bld: 99 mg/dL (ref 65–99)
Potassium: 4.1 mmol/L (ref 3.5–5.1)
Sodium: 139 mmol/L (ref 135–145)

## 2017-01-19 LAB — POCT I-STAT TROPONIN I
Troponin i, poc: 0.01 ng/mL (ref 0.00–0.08)
Troponin i, poc: 0 ng/mL (ref 0.00–0.08)

## 2017-01-19 LAB — HEPATIC FUNCTION PANEL
ALT: 11 U/L — ABNORMAL LOW (ref 17–63)
AST: 25 U/L (ref 15–41)
Albumin: 4.3 g/dL (ref 3.5–5.0)
Alkaline Phosphatase: 46 U/L (ref 38–126)
Bilirubin, Direct: <0.1 mg/dL — ABNORMAL LOW (ref 0.1–0.5)
Total Bilirubin: 0.5 mg/dL (ref 0.3–1.2)
Total Protein: 8.1 g/dL (ref 6.5–8.1)

## 2017-01-19 LAB — CBC
HCT: 43.3 % (ref 39.0–52.0)
Hemoglobin: 14 g/dL (ref 13.0–17.0)
MCH: 26.8 pg (ref 26.0–34.0)
MCHC: 32.3 g/dL (ref 30.0–36.0)
MCV: 83 fL (ref 78.0–100.0)
Platelets: 201 10*3/uL (ref 150–400)
RBC: 5.22 MIL/uL (ref 4.22–5.81)
RDW: 14.1 % (ref 11.5–15.5)
WBC: 5.8 10*3/uL (ref 4.0–10.5)

## 2017-01-19 LAB — D-DIMER, QUANTITATIVE: D-Dimer, Quant: 0.44 ug/mL-FEU (ref 0.00–0.50)

## 2017-01-19 LAB — LIPASE, BLOOD: Lipase: 36 U/L (ref 11–51)

## 2017-01-19 MED ORDER — OMEPRAZOLE 20 MG PO CPDR: 20.0000 mg | DELAYED_RELEASE_CAPSULE | Freq: Every day | ORAL | 0 refills | Status: DC

## 2017-01-19 MED ORDER — GI COCKTAIL ~~LOC~~
30.0000 mL | Freq: Once | ORAL | Status: AC
  Administered 2017-01-19: 30 mL via ORAL
  Filled 2017-01-19: qty 30

## 2017-01-19 MED ORDER — RANITIDINE HCL 150 MG PO CAPS: 150.0000 mg | ORAL_CAPSULE | Freq: Two times a day (BID) | ORAL | 0 refills | Status: DC

## 2017-01-19 MED ORDER — METOCLOPRAMIDE HCL 10 MG PO TABS
10.0000 mg | ORAL_TABLET | Freq: Once | ORAL | Status: AC
  Administered 2017-01-19: 10 mg via ORAL
  Filled 2017-01-19: qty 1

## 2017-01-19 NOTE — ED Triage Notes (Signed)
Pt states that he has had sharp shooting L chest pain since this morning and a tightness in his RUQ. Alert and oriented.

## 2017-01-19 NOTE — ED Provider Notes (Signed)
[facility] DEPT Provider Note   CSN: [id number] Arrival date & time: 01/19/17  1332     History   Chief Complaint Chief Complaint  Patient presents with  . Chest Pain    HPI Nicholas Rasmussen is a 71 y.o. male.  HPI   71 year old male with past medical history as below here with chest pain. Patient states that throughout the day today, he has had a dull, aching, epigastric and substernal chest sure. He also had transient, sharp, left-sided chest pain when he was lying on its right side earlier this morning. He has a history of reflux but took his antacids without significant improvement. He did have some mild improvement after eating, however. He denies any associated shortness of breath, nausea, vomiting, or diaphoresis. Denies any known history of heart disease. He has no history of hypertension, high cholesterol, diabetes, or significant family history of cardiac disease. Pain does not worsen with exertion. It does worsen when he lies flat as well as when he changes positions.  Past Medical History:  Diagnosis Date  . GERD (gastroesophageal reflux disease)   . Post herpetic neuralgia   . Seizures ([place])    Last seizure in the 70s.  . Shingles     Patient Active Problem List   Diagnosis Date Noted  . Postherpetic neuralgia 07/29/2016  . Left flank pain 01/02/2016  . Dizziness 01/02/2016  . Headache 03/13/2015  . GERD (gastroesophageal reflux disease) 11/15/2014  . Seizure disorder ([place]) 05/07/2014  . Allergic rhinitis 05/07/2014  . Routine general medical examination at a health care facility 05/07/2014    Past Surgical History:  Procedure Laterality Date  . KNEE SURGERY Left    left miniscus  . uvala surgery         Home Medications    Prior to Admission medications   Medication Sig Start Date End Date Taking? Authorizing Provider  aspirin EC 81 MG tablet Take 1 tablet (81 mg total) by mouth daily. 09/09/16  Yes Nche, Charlene Brooke, NP  aspirin-sod  bicarb-citric acid (ALKA-SELTZER) 325 MG TBEF tablet Take 325 mg by mouth every 6 (six) hours as needed (indigestion).   Yes [provider]  calcium carbonate (TUMS) 500 MG chewable tablet Chew 1 tablet by mouth daily as needed for indigestion or heartburn.   Yes [provider]  divalproex (DEPAKOTE) 250 MG DR tablet TAKE 1 TABLET (250 MG TOTAL) BY MOUTH 2 (TWO) TIMES DAILY. 11/17/16  Yes Nche, Charlene Brooke, NP  gabapentin (NEURONTIN) 300 MG capsule Take 1cap in morning and 2caps at bedtime Patient not taking: Reported on 01/19/2017 11/18/16   Nche, Charlene Brooke, NP  omeprazole (PRILOSEC) 20 MG capsule Take 1 capsule (20 mg total) by mouth daily. 01/19/17 02/02/17  Duffy Bruce, MD  ranitidine (ZANTAC) 150 MG capsule Take 1 capsule (150 mg total) by mouth 2 (two) times daily. 01/19/17 02/02/17  Duffy Bruce, MD  valACYclovir (VALTREX) 1000 MG tablet Take 1 tablet (1,000 mg total) by mouth 3 (three) times daily. Patient not taking: Reported on 01/19/2017 11/18/16   Nche, Charlene Brooke, NP    Family History Family History  Problem Relation Age of Onset  . Diabetes Father   . Hypertension Father   . Other Mother        natural causes    Social History Social History  Substance Use Topics  . Smoking status: Former Research scientist (life sciences)  . Smokeless tobacco: Never Used  . Alcohol use No     Allergies   Patient  has no known allergies.   Review of Systems Review of Systems  Respiratory: Positive for chest tightness.   Gastrointestinal: Positive for abdominal pain and nausea.  All other systems reviewed and are negative.    Physical Exam Updated Vital Signs BP (!) 164/89   Pulse (!) 54   Temp 98.2 F (36.8 C) (Oral)   Resp 12   SpO2 100%   Physical Exam  Constitutional: He is oriented to person, place, and time. He appears well-developed and well-nourished. No distress.  HENT:  Head: Normocephalic and atraumatic.  Eyes: Conjunctivae are normal.  Neck: Neck supple.    Cardiovascular: Normal rate, regular rhythm and normal heart sounds.  Exam reveals no friction rub.   No murmur heard. Pulmonary/Chest: Effort normal and breath sounds normal. No respiratory distress. He has no wheezes. He has no rales.  Abdominal: Soft. He exhibits no distension. There is tenderness (Mild, epigastric).  No right upper quadrant tenderness. Negative Murphy's.  Musculoskeletal: He exhibits no edema.  Neurological: He is alert and oriented to person, place, and time. He exhibits normal muscle tone.  Skin: Skin is warm. Capillary refill takes less than 2 seconds.  Psychiatric: He has a normal mood and affect.  Nursing note and vitals reviewed.    ED Treatments / Results  Labs (all labs ordered are listed, but only abnormal results are displayed) Labs Reviewed  HEPATIC FUNCTION PANEL - Abnormal; Notable for the following:       Result Value   ALT 11 (*)    Bilirubin, Direct <0.1 (*)    All other components within normal limits  BASIC METABOLIC PANEL  CBC  D-DIMER, QUANTITATIVE (NOT AT [facility])  LIPASE, BLOOD  I-STAT TROPONIN, ED  POCT I-STAT TROPONIN I  I-STAT TROPONIN, ED  POCT I-STAT TROPONIN I    EKG  EKG Interpretation  Date/Time:  Wednesday January 19 2017 13:40:07 EDT Ventricular Rate:  56 PR Interval:    QRS Duration: 94 QT Interval:  423 QTC Calculation: 409 R Axis:   -58 Text Interpretation:  Sinus rhythm Left anterior fascicular block Abnormal R-wave progression, late transition No significant change since last tracing Confirmed by Duffy Bruce [phone]) on 01/19/2017 7:03:27 PM       Radiology Dg Chest 2 View  Result Date: 01/19/2017 CLINICAL DATA:  Chest pain. EXAM: CHEST  2 VIEW COMPARISON:  Chest x-ray dated January 02, 2016. FINDINGS: The cardiomediastinal silhouette is normal in size. Normal pulmonary vascularity. No focal consolidation, pleural effusion, or pneumothorax. No acute osseous abnormality. Old left-sided rib fractures. IMPRESSION:  No active cardiopulmonary disease. Electronically Signed   By: Titus Dubin M.D.   On: 01/19/2017 14:39    Procedures Procedures (including critical care time)  Medications Ordered in ED Medications  gi cocktail (Maalox,Lidocaine,Donnatal) (30 mLs Oral Given 01/19/17 1955)  metoCLOPramide (REGLAN) tablet 10 mg (10 mg Oral Given 01/19/17 1955)     Initial Impression / Assessment and Plan / ED Course  I have reviewed the triage vital signs and the nursing notes.  Pertinent labs & imaging results that were available during my care of the patient were reviewed by me and considered in my medical decision making (see chart for details).     71 year old male with no significant past medical history here with transient, positional chest pain as well as chronic epigastric pressure-like sensation. Regarding his chest pain, suspect this is likely musculoskeletal in etiology. It occurred while he was lying on his side and is reproducible on my examination.  Regarding his pressure-like sensation, have a low suspicion for ACS. His EKG is nonischemic, and troponin negative 2 despite constant symptoms for greater than 24 hours. His symptoms have resolved with GI cocktail as well as Reglan. I suspect he has a strong component of underlying gastritis/GERD. Will start him on antacid therapy, advised outpatient follow-up. Otherwise, no apparent emergent pathology. D-dimer is negative and I do not suspect PE or dissection.  This note was prepared with assistance of Systems analyst. Occasional wrong-word or sound-a-like substitutions may have occurred due to the inherent limitations of voice recognition software.  Final Clinical Impressions(s) / ED Diagnoses   Final diagnoses:  Atypical chest pain  Gastroesophageal reflux disease with esophagitis    New Prescriptions Discharge Medication List as of 01/19/2017  9:01 PM    START taking these medications   Details  omeprazole (PRILOSEC)  20 MG capsule Take 1 capsule (20 mg total) by mouth daily., Starting Wed 01/19/2017, Until Wed 02/02/2017, Print    ranitidine (ZANTAC) 150 MG capsule Take 1 capsule (150 mg total) by mouth 2 (two) times daily., Starting Wed 01/19/2017, Until Wed 02/02/2017, Print         Duffy Bruce, MD 01/19/17 [phone]

## 2017-01-19 NOTE — ED Notes (Signed)
Pt had drawn for labs in triage:  Gold Blue Lavender Lt green Dark green

## 2017-02-03 ENCOUNTER — Encounter: Payer: Self-pay | Admitting: Neurology

## 2017-02-03 ENCOUNTER — Encounter: Payer: Self-pay | Admitting: Nurse Practitioner

## 2017-02-03 ENCOUNTER — Ambulatory Visit: Payer: PPO

## 2017-02-03 ENCOUNTER — Ambulatory Visit (INDEPENDENT_AMBULATORY_CARE_PROVIDER_SITE_OTHER): Payer: PPO | Admitting: Nurse Practitioner

## 2017-02-03 ENCOUNTER — Ambulatory Visit (INDEPENDENT_AMBULATORY_CARE_PROVIDER_SITE_OTHER): Payer: PPO | Admitting: Neurology

## 2017-02-03 VITALS — BP 138/76 | HR 50 | Temp 97.6°F | Ht 76.0 in | Wt 243.0 lb

## 2017-02-03 VITALS — BP 134/83 | HR 55 | Ht 76.0 in | Wt 244.0 lb

## 2017-02-03 DIAGNOSIS — G40909 Epilepsy, unspecified, not intractable, without status epilepticus: Secondary | ICD-10-CM

## 2017-02-03 DIAGNOSIS — Z23 Encounter for immunization: Secondary | ICD-10-CM | POA: Diagnosis not present

## 2017-02-03 DIAGNOSIS — B0229 Other postherpetic nervous system involvement: Secondary | ICD-10-CM

## 2017-02-03 DIAGNOSIS — K219 Gastro-esophageal reflux disease without esophagitis: Secondary | ICD-10-CM

## 2017-02-03 MED ORDER — OMEPRAZOLE 20 MG PO CPDR: 20.0000 mg | DELAYED_RELEASE_CAPSULE | Freq: Every day | ORAL | 0 refills | Status: DC

## 2017-02-03 NOTE — Progress Notes (Signed)
Subjective:  Patient ID: Nicholas Rasmussen, male    DOB: 1945-10-17  Age: 72 y.o. MRN: [id number]  CC: Hospitalization Follow-up (hospital follow up/gas reflux--med consult?-/ depakot consult? flu shot?)   HPI  GERD: Epigastric pain resolved with use of ranitidine and omeprazole. Has not made chnages to diet at this time. He does admit to eating high fat food items late at night. Denies any melena. No unintentional weight loss, no hoarseness, no sore throat, no dysphagia. no ETOH use. No tobacco use. No hx of hiatal hernia. No previous endoscopy.  He will like to know if he should switch from depakote BID or ER? This was recommended by neurology. Denies having any problem with BID dosing. Last depakote level was subtherapeutic, but stable. He denies any seizure activity.  Outpatient Medications Prior to Visit  Medication Sig Dispense Refill  . aspirin EC 81 MG tablet Take 1 tablet (81 mg total) by mouth daily. 30 tablet   . divalproex (DEPAKOTE) 250 MG DR tablet TAKE 1 TABLET (250 MG TOTAL) BY MOUTH 2 (TWO) TIMES DAILY. 180 tablet 1  . gabapentin (NEURONTIN) 300 MG capsule Take 1cap in morning and 2caps at bedtime 90 capsule 0  . calcium carbonate (TUMS) 500 MG chewable tablet Chew 1 tablet by mouth daily as needed for indigestion or heartburn.    . valACYclovir (VALTREX) 1000 MG tablet Take 1 tablet (1,000 mg total) by mouth 3 (three) times daily. (Patient not taking: Reported on 02/03/2017) 21 tablet 0  . aspirin-sod bicarb-citric acid (ALKA-SELTZER) 325 MG TBEF tablet Take 325 mg by mouth every 6 (six) hours as needed (indigestion).    Marland Kitchen omeprazole (PRILOSEC) 20 MG capsule Take 1 capsule (20 mg total) by mouth daily. 14 capsule 0  . ranitidine (ZANTAC) 150 MG capsule Take 1 capsule (150 mg total) by mouth 2 (two) times daily. 28 capsule 0   No facility-administered medications prior to visit.     ROS See HPI  Objective:  BP 138/76   Pulse (!) 50   Temp 97.6 F (36.4 C)    Ht 6\' 4"  (1.93 m)   Wt 243 lb (110.2 kg)   SpO2 98%   BMI 29.58 kg/m   BP Readings from Last 3 Encounters:  02/03/17 138/76  02/03/17 134/83  01/19/17 (!) 164/89    Wt Readings from Last 3 Encounters:  02/03/17 243 lb (110.2 kg)  02/03/17 244 lb (110.7 kg)  11/17/16 249 lb (112.9 kg)    Physical Exam  Constitutional: He is oriented to person, place, and time.  HENT:  Mouth/Throat: Oropharynx is clear and moist.  Cardiovascular: Normal rate.   Pulmonary/Chest: Effort normal.  Abdominal: Soft. He exhibits no distension. There is no tenderness.  Lymphadenopathy:    He has no cervical adenopathy.  Neurological: He is alert and oriented to person, place, and time.  Skin: Skin is warm and dry.  Psychiatric: He has a normal mood and affect. His behavior is normal.  Vitals reviewed.   Lab Results  Component Value Date   WBC 5.8 01/19/2017   HGB 14.0 01/19/2017   HCT 43.3 01/19/2017   PLT 201 01/19/2017   GLUCOSE 99 01/19/2017   CHOL 196 11/17/2016   TRIG 89.0 11/17/2016   HDL 49.40 11/17/2016   LDLCALC 128 (H) 11/17/2016   ALT 11 (L) 01/19/2017   AST 25 01/19/2017   NA 139 01/19/2017   K 4.1 01/19/2017   CL 102 01/19/2017   CREATININE 1.11 01/19/2017   BUN 17  01/19/2017   CO2 30 01/19/2017   TSH 1.08 11/12/2014   PSA 0.42 11/17/2016   HGBA1C 6.3 11/17/2016    Dg Chest 2 View  Result Date: 01/19/2017 CLINICAL DATA:  Chest pain. EXAM: CHEST  2 VIEW COMPARISON:  Chest x-ray dated January 02, 2016. FINDINGS: The cardiomediastinal silhouette is normal in size. Normal pulmonary vascularity. No focal consolidation, pleural effusion, or pneumothorax. No acute osseous abnormality. Old left-sided rib fractures. IMPRESSION: No active cardiopulmonary disease. Electronically Signed   By: Titus Dubin M.D.   On: 01/19/2017 14:39    Assessment & Plan:   Nicholas Rasmussen was seen today for hospitalization follow-up.  Diagnoses and all orders for this visit:  Gastroesophageal  reflux disease, esophagitis presence not specified -     omeprazole (PRILOSEC) 20 MG capsule; Take 1 capsule (20 mg total) by mouth daily.  Need for influenza vaccination -     Flu vaccine HIGH DOSE PF   I have discontinued Nicholas Rasmussen's calcium carbonate, aspirin-sod bicarb-citric acid, and ranitidine. I am also having him maintain his aspirin EC, divalproex, valACYclovir, gabapentin, and omeprazole.  Meds ordered this encounter  Medications  . omeprazole (PRILOSEC) 20 MG capsule    Sig: Take 1 capsule (20 mg total) by mouth daily.    Dispense:  60 capsule    Refill:  0    Order Specific Question:   Supervising Provider    Answer:   Cassandria Anger [[id number]]    Follow-up: Return in about 6 months (around 08/03/2017) for prediabetes.  [name], NP

## 2017-02-03 NOTE — Patient Instructions (Addendum)
Use omeprazole for another 14months then switch to as needed.  Stop ranitidine.  Ok to switch to depakote ER as recommended by neurology.  Food Choices for Gastroesophageal Reflux Disease, Adult When you have gastroesophageal reflux disease (GERD), the foods you eat and your eating habits are very important. Choosing the right foods can help ease the discomfort of GERD. Consider working with a diet and nutrition specialist (dietitian) to help you make healthy food choices. What general guidelines should I follow? Eating plan  Choose healthy foods low in fat, such as fruits, vegetables, whole grains, low-fat dairy products, and lean meat, fish, and poultry.  Eat frequent, small meals instead of three large meals each day. Eat your meals slowly, in a relaxed setting. Avoid bending over or lying down until 2-3 hours after eating.  Limit high-fat foods such as fatty meats or fried foods.  Limit your intake of oils, butter, and shortening to less than 8 teaspoons each day.  Avoid the following: ? Foods that cause symptoms. These may be different for different people. Keep a food diary to keep track of foods that cause symptoms. ? Alcohol. ? Drinking large amounts of liquid with meals. ? Eating meals during the 2-3 hours before bed.  Cook foods using methods other than frying. This may include baking, grilling, or broiling. Lifestyle   Maintain a healthy weight. Ask your health care provider what weight is healthy for you. If you need to lose weight, work with your health care provider to do so safely.  Exercise for at least 30 minutes on 5 or more days each week, or as told by your health care provider.  Avoid wearing clothes that fit tightly around your waist and chest.  Do not use any products that contain nicotine or tobacco, such as cigarettes and e-cigarettes. If you need help quitting, ask your health care provider.  Sleep with the head of your bed raised. Use a wedge under the  mattress or blocks under the bed frame to raise the head of the bed. What foods are not recommended? The items listed may not be a complete list. Talk with your dietitian about what dietary choices are best for you. Grains Pastries or quick breads with added fat. [place] toast. Vegetables Deep fried vegetables. [place] fries. Any vegetables prepared with added fat. Any vegetables that cause symptoms. For some people this may include tomatoes and tomato products, chili peppers, onions and garlic, and horseradish. Fruits Any fruits prepared with added fat. Any fruits that cause symptoms. For some people this may include citrus fruits, such as oranges, grapefruit, pineapple, and lemons. Meats and other protein foods High-fat meats, such as fatty beef or pork, hot dogs, ribs, ham, sausage, salami and bacon. Fried meat or protein, including fried fish and fried chicken. Nuts and nut butters. Dairy Whole milk and chocolate milk. Sour cream. Cream. Ice cream. Cream cheese. Milk shakes. Beverages Coffee and tea, with or without caffeine. Carbonated beverages. Sodas. Energy drinks. Fruit juice made with acidic fruits (such as orange or grapefruit). Tomato juice. Alcoholic drinks. Fats and oils Butter. Margarine. Shortening. Ghee. Sweets and desserts Chocolate and cocoa. Donuts. Seasoning and other foods Pepper. Peppermint and spearmint. Any condiments, herbs, or seasonings that cause symptoms. For some people, this may include curry, hot sauce, or vinegar-based salad dressings. Summary  When you have gastroesophageal reflux disease (GERD), food and lifestyle choices are very important to help ease the discomfort of GERD.  Eat frequent, small meals instead of three large  meals each day. Eat your meals slowly, in a relaxed setting. Avoid bending over or lying down until 2-3 hours after eating.  Limit high-fat foods such as fatty meat or fried foods. This information is not intended to replace advice  given to you by your health care provider. Make sure you discuss any questions you have with your health care provider. Document Released: 05/10/2005 Document Revised: 05/11/2016 Document Reviewed: 05/11/2016 Elsevier Interactive Patient Education  2017 [employer].

## 2017-02-03 NOTE — Progress Notes (Signed)
PATIENT: Nicholas Rasmussen DOB: 02/21/1946  Chief Complaint  Patient presents with  . Postherpetic Neuralgia    Reports improvement in pain from shingles.  He is only using gabapentin on a prn basis.  . Seizures    No seizure activity reported.     HISTORICAL  Nicholas Rasmussen is a 71 year old right-handed male, seen in refer by his primary care doctor  [name] for evaluation of postherpetic neuralgia, initial evaluation was on March eighth 2018.  He had a history of epilepsy, for seizure was in high school, had about 4 generalized seizure in his lifetime, last seizure was in 1975, all seizure has similar seminology, he would woke up from sleep and felt body vibrating sensation, few seconds later, he would get into generalized tonic-clonic seizure, he was initially treated with Dilantin, after most recent recurrent seizure, he was switched to current dose of Depakote DR 250 mg Twice a day, he tolerated the medication well, there was no seizure, no auras.  On July 13 2016, he noticed right frontal area headaches, which was quite unusual for him, next day, he noticed rash broke out, nerve pain, sensitive to touch, he was diagnosed with shingles, was treated with veltrex 1000 mg tid and tapering dose of prednisone for 1 week, rash lasted about 1 week, gradually resolved, for a while, he has sharp and neuropathic pain at right frontal region, difficulty sleeping.   Now his pain has much improved, he is not taking gabapentin 300 mg/600 mg, mild dizziness and lightheadedness with medications, he can sleep much better now  Personally reviewed CT head without contrast November 2017 that was normal. Laboratory evaluation normal CMP, CBC, hemoglobin was 12.5, A1c 6.2  UPDATE Feb 03 2017: Laboratory evaluation seeing August 2018 showed negative troponin, normal CMP, negative d-dimer, CBC, He rarely has right frontal shingles pain, only taking gabapentin occasionally, occasionally  right frontal headache is resolved by over-the-counter NSAIDs,  He does not want to change his Depakote DR 250 bid  REVIEW OF SYSTEMS: Full 14 system review of systems performed and notable only for Headache  ALLERGIES: No Known Allergies  HOME MEDICATIONS: Current Outpatient Prescriptions  Medication Sig Dispense Refill  . aspirin EC 81 MG tablet Take 1 tablet (81 mg total) by mouth daily. 30 tablet   . aspirin-sod bicarb-citric acid (ALKA-SELTZER) 325 MG TBEF tablet Take 325 mg by mouth every 6 (six) hours as needed (indigestion).    . calcium carbonate (TUMS) 500 MG chewable tablet Chew 1 tablet by mouth daily as needed for indigestion or heartburn.    . divalproex (DEPAKOTE) 250 MG DR tablet TAKE 1 TABLET (250 MG TOTAL) BY MOUTH 2 (TWO) TIMES DAILY. 180 tablet 1  . gabapentin (NEURONTIN) 300 MG capsule Take 1cap in morning and 2caps at bedtime 90 capsule 0  . valACYclovir (VALTREX) 1000 MG tablet Take 1 tablet (1,000 mg total) by mouth 3 (three) times daily. 21 tablet 0  . omeprazole (PRILOSEC) 20 MG capsule Take 1 capsule (20 mg total) by mouth daily. 14 capsule 0  . ranitidine (ZANTAC) 150 MG capsule Take 1 capsule (150 mg total) by mouth 2 (two) times daily. 28 capsule 0   No current facility-administered medications for this visit.     PAST MEDICAL HISTORY: Past Medical History:  Diagnosis Date  . GERD (gastroesophageal reflux disease)   . Post herpetic neuralgia   . Seizures ([place])    Last seizure in the 70s.  . Shingles  PAST SURGICAL HISTORY: Past Surgical History:  Procedure Laterality Date  . KNEE SURGERY Left    left miniscus  . uvala surgery      FAMILY HISTORY: Family History  Problem Relation Age of Onset  . Diabetes Father   . Hypertension Father   . Other Mother        natural causes    SOCIAL HISTORY:  Social History   Social History  . Marital status: Single    Spouse name: N/A  . Number of children: 3  . Years of education: Associates    Occupational History  . Retired    Social History Main Topics  . Smoking status: Former Research scientist (life sciences)  . Smokeless tobacco: Never Used  . Alcohol use No  . Drug use: No  . Sexual activity: Not on file   Other Topics Concern  . Not on file   Social History Narrative   Lives alone.   Right-handed.   Rarely drinks caffeine.        PHYSICAL EXAM   Vitals:   02/03/17 1348  BP: 134/83  Pulse: (!) 55  Weight: 244 lb (110.7 kg)  Height: 6\' 4"  (1.93 m)    Not recorded      Body mass index is 29.7 kg/m.  PHYSICAL EXAMNIATION:  Gen: NAD, conversant, well nourised, obese, well groomed                     Cardiovascular: Regular rate rhythm, no peripheral edema, warm, nontender. Eyes: Conjunctivae clear without exudates or hemorrhage Neck: Supple, no carotid bruits. Pulmonary: Clear to auscultation bilaterally   NEUROLOGICAL EXAM:  MENTAL STATUS: Speech:    Speech is normal; fluent and spontaneous with normal comprehension.  Cognition:     Orientation to time, place and person     Normal recent and remote memory     Normal Attention span and concentration     Normal Language, naming, repeating,spontaneous speech     Fund of knowledge   CRANIAL NERVES: CN II: Visual fields are full to confrontation. Fundoscopic exam is normal with sharp discs and no vascular changes. Pupils are round equal and briskly reactive to light. CN III, IV, VI: extraocular movement are normal. No ptosis. CN V: Facial sensation is intact to pinprick in all 3 divisions bilaterally. Corneal responses are intact.  CN VII: Face is symmetric with normal eye closure and smile. CN VIII: Hearing is normal to rubbing fingers CN IX, X: Palate elevates symmetrically. Phonation is normal. CN XI: Head turning and shoulder shrug are intact CN XII: Tongue is midline with normal movements and no atrophy.  MOTOR: There is no pronator drift of out-stretched arms. Muscle bulk and tone are normal. Muscle  strength is normal.  REFLEXES: Reflexes are 2+ and symmetric at the biceps, triceps, knees, and ankles. Plantar responses are flexor.  SENSORY: Intact to light touch, pinprick, positional sensation and vibratory sensation are intact in fingers and toes.  COORDINATION: Rapid alternating movements and fine finger movements are intact. There is no dysmetria on finger-to-nose and heel-knee-shin.    GAIT/STANCE: Posture is normal. Gait is steady with normal steps, base, arm swing, and turning. Heel and toe walking are normal. Tandem gait is normal.  Romberg is absent.   DIAGNOSTIC DATA (LABS, IMAGING, TESTING) - I reviewed patient records, labs, notes, testing and imaging myself where available.   ASSESSMENT AND PLAN  Nicholas Rasmussen is a 71 y.o. male   Right V1 shingles in February 2018,  mild postherpetic neuralgia pain  Has much improved, only take gabapentin 300mg  as needed  Epilepsy, last recurrence seizure in 1975  On low-dose Depakote DR 250 mg twice a day   I have suggested Depakote ER 500mg  qhs, he will consider it.   Marcial Pacas, M.D. Ph.D.  [facility] [address] [place], [place] [address] Ph: [phone] Fax: [phone]  CC: [name],

## 2017-02-24 ENCOUNTER — Telehealth: Payer: Self-pay | Admitting: Neurology

## 2017-02-24 MED ORDER — DIVALPROEX SODIUM ER 500 MG PO TB24: 500.0000 mg | ORAL_TABLET | Freq: Every day | ORAL | 3 refills | Status: DC

## 2017-02-24 NOTE — Telephone Encounter (Signed)
Pt request new RX for generic depakote ER 500mg  qhs 90 day supply as discussed at last OV with Dr Krista Blue sent to [facility].

## 2017-02-24 NOTE — Telephone Encounter (Signed)
Chart reviewed by MD.  Per vo by Dr. Krista Blue, send in Depakote ER 500mg , one tabl qhs to pharmacy.  Prescription sent electronically.

## 2017-02-28 NOTE — Telephone Encounter (Signed)
Patient called office returning RN's call.  Please call °

## 2017-02-28 NOTE — Telephone Encounter (Signed)
Patient wants to discuss going back to lower dosage of divalproex (DEPAKOTE ER) 500 MG 24 hr tablet because of headache every morning and dry mouth.

## 2017-02-28 NOTE — Telephone Encounter (Signed)
Returned call to patient.  He just started the medication five days ago and has experienced a few headaches.  He is willing to continue the medication for longer to see if the headaches subside.  He will call back in a couple of weeks, if he is no better (sooner if his headaches become intolerable).

## 2017-02-28 NOTE — Telephone Encounter (Signed)
Left message requesting a return call.

## 2017-04-20 ENCOUNTER — Ambulatory Visit: Payer: PPO | Admitting: Nurse Practitioner

## 2017-04-20 ENCOUNTER — Telehealth: Payer: Self-pay | Admitting: Nurse Practitioner

## 2017-04-20 VITALS — BP 110/70 | HR 64 | Temp 97.6°F | Ht 76.0 in | Wt 242.0 lb

## 2017-04-20 DIAGNOSIS — R51 Headache: Secondary | ICD-10-CM

## 2017-04-20 DIAGNOSIS — R519 Headache, unspecified: Secondary | ICD-10-CM

## 2017-04-20 DIAGNOSIS — K219 Gastro-esophageal reflux disease without esophagitis: Secondary | ICD-10-CM

## 2017-04-20 DIAGNOSIS — B0229 Other postherpetic nervous system involvement: Secondary | ICD-10-CM | POA: Diagnosis not present

## 2017-04-20 DIAGNOSIS — R0789 Other chest pain: Secondary | ICD-10-CM | POA: Diagnosis not present

## 2017-04-20 MED ORDER — OMEPRAZOLE 20 MG PO CPDR: 20.0000 mg | DELAYED_RELEASE_CAPSULE | Freq: Every day | ORAL | 0 refills | Status: DC

## 2017-04-20 MED ORDER — RANITIDINE HCL 300 MG PO TABS: 300.0000 mg | ORAL_TABLET | Freq: Every day | ORAL | 0 refills | Status: DC

## 2017-04-20 MED ORDER — GABAPENTIN 600 MG PO TABS: 600.0000 mg | ORAL_TABLET | Freq: Every day | ORAL | 0 refills | Status: DC

## 2017-04-20 NOTE — Progress Notes (Signed)
Subjective:  Patient ID: Nicholas Rasmussen, male    DOB: April 26, 1946  Age: 71 y.o. MRN: [id number]  CC: GI Problem (belching,headache last night (similar to shingle headache). tightness in upper chest area and belch. tingling in hands A1c?)   GI Problem  Primary symptoms do not include fever, weight loss, fatigue, abdominal pain, nausea, vomiting, diarrhea, melena, hematemesis, jaundice, hematochezia, dysuria, myalgias, arthralgias or rash. Primary symptoms comment: Belching. The illness began 3 to 5 days ago. The onset was gradual. The problem has not changed since onset. The illness is also significant for anorexia and bloating. The illness does not include chills, dysphagia, odynophagia, constipation, tenesmus, back pain or itching. Significant associated medical issues include GERD. Associated medical issues do not include PUD.  Chest Pain   This is a new problem. The current episode started yesterday. The onset quality is sudden. The problem occurs constantly. The pain is present in the substernal region. The pain is moderate. The quality of the pain is described as tightness, dull and pressure. The pain radiates to the left arm. Pertinent negatives include no abdominal pain, back pain, claudication, cough, exertional chest pressure, fever, malaise/fatigue, nausea, orthopnea, palpitations, shortness of breath or vomiting. Associated symptoms comments: Tingling of left fingers. Risk factors include male gender and being elderly.  Pertinent negatives for past medical history include no anxiety/panic attacks, no CAD, no diabetes, no hypertension, no recent injury, no sleep apnea and no stimulant use.  Pertinent negatives for family medical history include: no CAD, no diabetes, no heart disease, no early MI and no stroke. Prior diagnostic workup includes chest x-ray (CXR, troponin, ddimer done 12/2016 (negative)).   Headache: Onset last night. Had similar headache if past with shingles outbreak on  right forehead. Frontal headache, right side, dull, improves with rest. No other symptoms. Feels irritated Has resolved. No Rx or OTC medication used.  GERD: Belching continuously. Has not used omeprazole in over 2weeks. Reports he was treated for h.pylori in 1982s  Chest wall pressure. And tingling in left fingers.  Outpatient Medications Prior to Visit  Medication Sig Dispense Refill  . divalproex (DEPAKOTE ER) 500 MG 24 hr tablet Take 1 tablet (500 mg total) by mouth at bedtime. 90 tablet 3  . omeprazole (PRILOSEC) 20 MG capsule Take 1 capsule (20 mg total) by mouth daily. 60 capsule 0  . aspirin EC 81 MG tablet Take 1 tablet (81 mg total) by mouth daily. (Patient not taking: Reported on 04/20/2017) 30 tablet   . gabapentin (NEURONTIN) 300 MG capsule Take 1cap in morning and 2caps at bedtime (Patient not taking: Reported on 04/20/2017) 90 capsule 0  . valACYclovir (VALTREX) 1000 MG tablet Take 1 tablet (1,000 mg total) by mouth 3 (three) times daily. (Patient not taking: Reported on 04/20/2017) 21 tablet 0   No facility-administered medications prior to visit.     ROS See HPI  Objective:  BP 110/70   Pulse 64   Temp 97.6 F (36.4 C)   Ht 6\' 4"  (1.93 m)   Wt 242 lb (109.8 kg)   SpO2 98%   BMI 29.46 kg/m   BP Readings from Last 3 Encounters:  04/20/17 110/70  02/03/17 138/76  02/03/17 134/83    Wt Readings from Last 3 Encounters:  04/20/17 242 lb (109.8 kg)  02/03/17 243 lb (110.2 kg)  02/03/17 244 lb (110.7 kg)    Physical Exam  Constitutional: He is oriented to person, place, and time. No distress.  Neck: Normal range of motion. Neck  supple. No JVD present.  Cardiovascular: Normal rate, regular rhythm and normal heart sounds.  Pulmonary/Chest: Effort normal and breath sounds normal.  Abdominal: Soft. Bowel sounds are normal. There is no tenderness.  Musculoskeletal: He exhibits no edema or tenderness.  Neurological: He is alert and oriented to person, place,  and time.  Skin: Skin is warm and dry.  Vitals reviewed.  ECG: sinus bradycardia, no changes compared to previous ECG done 12/2016.  Lab Results  Component Value Date   WBC 5.8 01/19/2017   HGB 14.0 01/19/2017   HCT 43.3 01/19/2017   PLT 201 01/19/2017   GLUCOSE 99 01/19/2017   CHOL 196 11/17/2016   TRIG 89.0 11/17/2016   HDL 49.40 11/17/2016   LDLCALC 128 (H) 11/17/2016   ALT 11 (L) 01/19/2017   AST 25 01/19/2017   NA 139 01/19/2017   K 4.1 01/19/2017   CL 102 01/19/2017   CREATININE 1.11 01/19/2017   BUN 17 01/19/2017   CO2 30 01/19/2017   TSH 1.08 11/12/2014   PSA 0.42 11/17/2016   HGBA1C 6.3 11/17/2016    Dg Chest 2 View  Result Date: 01/19/2017 CLINICAL DATA:  Chest pain. EXAM: CHEST  2 VIEW COMPARISON:  Chest x-ray dated January 02, 2016. FINDINGS: The cardiomediastinal silhouette is normal in size. Normal pulmonary vascularity. No focal consolidation, pleural effusion, or pneumothorax. No acute osseous abnormality. Old left-sided rib fractures. IMPRESSION: No active cardiopulmonary disease. Electronically Signed   By: Titus Dubin M.D.   On: 01/19/2017 14:39    Assessment & Plan:   Nicholas Rasmussen was seen today for gi problem.  Diagnoses and all orders for this visit:  Atypical chest pain -     EKG 12-Lead -     Ambulatory referral to Cardiology  Intractable episodic headache, unspecified headache type -     gabapentin (NEURONTIN) 600 MG tablet; Take 1 tablet (600 mg total) by mouth at bedtime.  Post herpetic neuralgia -     gabapentin (NEURONTIN) 600 MG tablet; Take 1 tablet (600 mg total) by mouth at bedtime.  Gastroesophageal reflux disease, esophagitis presence not specified -     ranitidine (ZANTAC) 300 MG tablet; Take 1 tablet (300 mg total) by mouth at bedtime. -     omeprazole (PRILOSEC) 20 MG capsule; Take 1 capsule (20 mg total) by mouth daily. -     Cancel: H Pylori, IGM, IGG, IGA AB -     H Pylori, IGM, IGG, IGA AB   I have discontinued Tylyn  Reaver's aspirin EC, valACYclovir, and gabapentin. I am also having him start on ranitidine and gabapentin. Additionally, [name] his divalproex and omeprazole.  Meds ordered this encounter  Medications  . ranitidine (ZANTAC) 300 MG tablet    Sig: Take 1 tablet (300 mg total) by mouth at bedtime.    Dispense:  14 tablet    Refill:  0    Order Specific Question:   Supervising Provider    Answer:   Lucille Passy [[id number]]  . omeprazole (PRILOSEC) 20 MG capsule    Sig: Take 1 capsule (20 mg total) by mouth daily.    Dispense:  90 capsule    Refill:  0    Order Specific Question:   Supervising Provider    Answer:   Lucille Passy [[id number]]  . gabapentin (NEURONTIN) 600 MG tablet    Sig: Take 1 tablet (600 mg total) by mouth at bedtime.    Dispense:  30 tablet  Refill:  0    Order Specific Question:   Supervising Provider    Answer:   Lucille Passy [[id number]]    Follow-up: No Follow-up on file.  [name], NP

## 2017-04-20 NOTE — Telephone Encounter (Signed)
Patient called looking for omeprazole prescription. Pharmacy had it already.

## 2017-04-20 NOTE — Telephone Encounter (Signed)
Pt said the medication is not working. He is taking Depakote 250mg  am and 250mg  pm. He also experiences being cold. Please call to discuss at [phone]

## 2017-04-20 NOTE — Telephone Encounter (Signed)
Spoke to patient - he is actually taking Depakote ER 500mg , 24hr, one tablet at bedtime.  He has not been feeling well and went to see his PCP.  After his appt, he does not feel his symptoms are related to this medication and neither did his PCP.  He will continue this dosage and call us back with any further concerns.

## 2017-04-20 NOTE — Patient Instructions (Addendum)
Resume gabapentin 600mg  at bedtime.  Resume omeprazole. Start ranitidine at bedtime.  Go to lab for blood draw  You will be contacted to schedule appt with cardiology.

## 2017-04-21 ENCOUNTER — Encounter: Payer: Self-pay | Admitting: Nurse Practitioner

## 2017-04-22 ENCOUNTER — Encounter: Payer: Self-pay | Admitting: Physician Assistant

## 2017-04-22 ENCOUNTER — Other Ambulatory Visit: Payer: Self-pay | Admitting: Nurse Practitioner

## 2017-04-22 DIAGNOSIS — A048 Other specified bacterial intestinal infections: Secondary | ICD-10-CM

## 2017-04-22 DIAGNOSIS — R1013 Epigastric pain: Secondary | ICD-10-CM

## 2017-04-22 LAB — H PYLORI, IGM, IGG, IGA AB
H pylori, IgM Abs: 11.7 units — ABNORMAL HIGH (ref 0.0–8.9)
H. pylori, IgA Abs: <9 units (ref 0.0–8.9)
H. pylori, IgG AbS: 1.16 Index Value — ABNORMAL HIGH (ref 0.00–0.79)

## 2017-05-02 ENCOUNTER — Ambulatory Visit: Payer: PPO | Admitting: Physician Assistant

## 2017-05-05 ENCOUNTER — Other Ambulatory Visit: Payer: PPO

## 2017-05-05 ENCOUNTER — Encounter: Payer: Self-pay | Admitting: Physician Assistant

## 2017-05-05 ENCOUNTER — Ambulatory Visit: Payer: PPO | Admitting: Physician Assistant

## 2017-05-05 VITALS — BP 120/78 | HR 64 | Ht 76.0 in | Wt 248.2 lb

## 2017-05-05 DIAGNOSIS — R142 Eructation: Secondary | ICD-10-CM | POA: Diagnosis not present

## 2017-05-05 DIAGNOSIS — K219 Gastro-esophageal reflux disease without esophagitis: Secondary | ICD-10-CM | POA: Diagnosis not present

## 2017-05-05 DIAGNOSIS — R768 Other specified abnormal immunological findings in serum: Secondary | ICD-10-CM

## 2017-05-05 MED ORDER — ONDANSETRON 4 MG PO TBDP: 4.0000 mg | ORAL_TABLET | ORAL | 1 refills | Status: DC | PRN

## 2017-05-05 MED ORDER — PANTOPRAZOLE SODIUM 40 MG PO TBEC: 40.0000 mg | DELAYED_RELEASE_TABLET | Freq: Two times a day (BID) | ORAL | 2 refills | Status: DC

## 2017-05-05 MED ORDER — OMEPRAZOLE 40 MG PO CPDR: 40.0000 mg | DELAYED_RELEASE_CAPSULE | Freq: Every day | ORAL | 3 refills | Status: DC

## 2017-05-05 NOTE — Patient Instructions (Addendum)
  Increase Omeprazole 40 mg daily   Stop Omeprazole and Ranitidine 2 weeks before bringing in your stool sample.   Your physician has requested that you go to the basement for lab work before leaving today.

## 2017-05-05 NOTE — Progress Notes (Addendum)
Chief Complaint: GERD, belching  HPI:    Nicholas Rasmussen is a 71 year old African-American male with a past medical history as listed below, who was referred to me by Nche, Charlene Brooke, NP for a complaint of GERD and belching.      Patient had recent H. pylori IgG antibody which was minimally elevated at 1.16 and an H. pylori, IgM antibody which was elevated at 11 (0.0-8.9).  Patient was seen at an office visit that same day and prescribed Omeprazole 20 mg daily as well as Ranitidine 150 mg at night.    Today, the patient tells me that he has been taking his prescribed medication over the past 2 weeks and has actually felt some better.  Prior to this patient was having reflux when "I would eat the wrong foods" and "belching all the time".  Patient tells me he knows that if he avoids certain foods he will not have symptoms at all or at least will not have as many symptoms, but "this is the fat season between Thanksgiving and Christmas" and the patient has been indulging.  Generally, the patient tells me that he likes to live a healthy lifestyle and tries to avoid doing anything that would cause his body harm, but over the holidays he likes to eat.  Patient tells me he was positive for H. pylori back in the 90s and was recently again told he could be positive.  Currently, he is using his Omeprazole 20 mg once daily and Ranitidine 150 mg at night and again has experienced a decrease in symptoms.    Patient's medical history is positive for his last EGD and colonoscopy in [place] around 2015.  Patient does not have a family history of colon cancer and believes he has never had colon polyps.  He does not remember findings of his EGD but thinks that it was "normal".     Patient denies fever, chills, blood in stool, melena, weight loss, anorexia, nausea, vomiting, change in bowel habits or abdominal pain.  Past Medical History:  Diagnosis Date  . GERD (gastroesophageal reflux disease)   . Post herpetic  neuralgia   . Seizures ([place])    Last seizure in the 70s.  . Shingles     Past Surgical History:  Procedure Laterality Date  . KNEE SURGERY Left    left miniscus  . uvala surgery      Current Outpatient Medications  Medication Sig Dispense Refill  . divalproex (DEPAKOTE ER) 500 MG 24 hr tablet Take 1 tablet (500 mg total) by mouth at bedtime. 90 tablet 3  . gabapentin (NEURONTIN) 600 MG tablet Take 1 tablet (600 mg total) by mouth at bedtime. 30 tablet 0  . omeprazole (PRILOSEC) 20 MG capsule Take 1 capsule (20 mg total) by mouth daily. 90 capsule 0  . omeprazole (PRILOSEC) 40 MG capsule Take 1 capsule (40 mg total) by mouth daily. 30 capsule 3   No current facility-administered medications for this visit.     Allergies as of 05/05/2017  . (No Known Allergies)    Family History  Problem Relation Age of Onset  . Diabetes Father   . Hypertension Father   . Other Mother        natural causes    Social History   Socioeconomic History  . Marital status: Single    Spouse name: Not on file  . Number of children: 3  . Years of education: Associates  . Highest education level: Not on file  Social Needs  . Financial resource strain: Not on file  . Food insecurity - worry: Not on file  . Food insecurity - inability: Not on file  . Transportation needs - medical: Not on file  . Transportation needs - non-medical: Not on file  Occupational History  . Occupation: Retired  Tobacco Use  . Smoking status: Former Research scientist (life sciences)  . Smokeless tobacco: Never Used  Substance and Sexual Activity  . Alcohol use: No    Alcohol/week: 0.0 oz  . Drug use: No  . Sexual activity: Not on file  Other Topics Concern  . Not on file  Social History Narrative   Lives alone.   Right-handed.   Rarely drinks caffeine.    Review of Systems:    Constitutional: No weight loss, fever or chills Skin: No rash Cardiovascular: No chest pain Respiratory: No SOB  Gastrointestinal: See HPI and otherwise  negative Genitourinary: No dysuria Neurological: No headache Musculoskeletal: No new muscle or joint pain Hematologic: No bleeding  Psychiatric: No history of depression or anxiety   Physical Exam:  Vital signs: BP 120/78   Pulse 64   Ht 6\' 4"  (1.93 m)   Wt 248 lb 3.2 oz (112.6 kg)   BMI 30.21 kg/m   Constitutional:   Pleasant AA male appears to be in NAD, Well developed, Well nourished, alert and cooperative Head:  Normocephalic and atraumatic. Eyes:   PEERL, EOMI. No icterus. Conjunctiva pink. Ears:  Normal auditory acuity. Neck:  Supple Throat: Oral cavity and pharynx without inflammation, swelling or lesion.  Respiratory: Respirations even and unlabored. Lungs clear to auscultation bilaterally.   No wheezes, crackles, or rhonchi.  Cardiovascular: Normal S1, S2. No MRG. Regular rate and rhythm. No peripheral edema, cyanosis or pallor.  Gastrointestinal:  Soft, nondistended, nontender. No rebound or guarding. Normal bowel sounds. No appreciable masses or hepatomegaly. Rectal:  Not performed.  Msk:  Symmetrical without gross deformities. Without edema, no deformity or joint abnormality.  Neurologic:  Alert and  oriented x4;  grossly normal neurologically.  Skin:   Dry and intact without significant lesions or rashes. Psychiatric:  Demonstrates good judgement and reason without abnormal affect or behaviors.  MOST RECENT LABS AND IMAGING: CBC    Component Value Date/Time   WBC 5.8 01/19/2017 1709   RBC 5.22 01/19/2017 1709   HGB 14.0 01/19/2017 1709   HCT 43.3 01/19/2017 1709   PLT 201 01/19/2017 1709   MCV 83.0 01/19/2017 1709   MCH 26.8 01/19/2017 1709   MCHC 32.3 01/19/2017 1709   RDW 14.1 01/19/2017 1709   LYMPHSABS 1.4 04/21/2016 1156   MONOABS 0.5 04/21/2016 1156   EOSABS 0.0 04/21/2016 1156   BASOSABS 0.0 04/21/2016 1156    CMP     Component Value Date/Time   NA 139 01/19/2017 1709   K 4.1 01/19/2017 1709   CL 102 01/19/2017 1709   CO2 30 01/19/2017 1709     GLUCOSE 99 01/19/2017 1709   BUN 17 01/19/2017 1709   CREATININE 1.11 01/19/2017 1709   CALCIUM 9.4 01/19/2017 1709   PROT 8.1 01/19/2017 1709   ALBUMIN 4.3 01/19/2017 1709   AST 25 01/19/2017 1709   ALT 11 (L) 01/19/2017 1709   ALKPHOS 46 01/19/2017 1709   BILITOT 0.5 01/19/2017 1709   GFRNONAA >60 01/19/2017 1709   GFRAA >60 01/19/2017 1709    Assessment: 1.  GERD: Increased symptoms over the past year or so, most recently these have decreased on Omeprazole 20 mg daily and Ranitidine  150 mg at night, recent H. pylori antibody testing was positive, the patient does have a history of H. pylori back in the 90s which was treated, patient can also avoid most of his symptoms with the right diet and lifestyle changes; consider H. pylori versus gastritis versus other 2.  Belching: With above 3.  Positive H. pylori IgM Ab  Plan: 1.  Discussed with the patient that the only way to find out if he truly does have H. pylori would be to do an H. pylori fecal antigen off of his PPI and H2 blocker.  Patient would like to have this testing done. 2.  Discussed with patient that he can remain on a higher dose of his Omeprazole, 40 mg daily until Christmas, provided him with a prescription.  He was then told that he will need to stop this medication and his Ranitidine for 2 weeks prior to providing a stool sample for his H. pylori fecal antigen testing.  He verbalized understanding and is thankful that he can be on medicine over the holidays. 3.  Reviewed antireflux diet and lifestyle measures 4.  We will request his records from [place] in regards to his last EGD and colonoscopy reportedly in 2015. 5.  Patient to follow in clinic per recommendations after H. pylori fecal antigen testing in January.  He was assigned to Dr. Carlean Purl today.  Ellouise Newer, PA-C [facility] 05/05/2017, 3:36 PM  Cc: Flossie Buffy, NP   Agree with Ms. Zayvien Canning's evaluation and management.  Gatha Mayer, MD, [facility]

## 2017-05-11 ENCOUNTER — Encounter: Payer: Self-pay | Admitting: Interventional Cardiology

## 2017-05-11 ENCOUNTER — Ambulatory Visit: Payer: PPO | Admitting: Interventional Cardiology

## 2017-05-11 VITALS — BP 112/68 | HR 59 | Ht 76.0 in | Wt 248.0 lb

## 2017-05-11 DIAGNOSIS — K219 Gastro-esophageal reflux disease without esophagitis: Secondary | ICD-10-CM | POA: Diagnosis not present

## 2017-05-11 DIAGNOSIS — R0789 Other chest pain: Secondary | ICD-10-CM

## 2017-05-11 DIAGNOSIS — E785 Hyperlipidemia, unspecified: Secondary | ICD-10-CM | POA: Diagnosis not present

## 2017-05-11 DIAGNOSIS — I700 Atherosclerosis of aorta: Secondary | ICD-10-CM

## 2017-05-11 NOTE — Progress Notes (Signed)
Cardiology Office Note    Date:  05/11/2017   ID:  Nicholas Rasmussen, DOB 1946-05-13, MRN [id number]  PCP:  Flossie Buffy, NP  Cardiologist: Sinclair Grooms, MD   Chief Complaint  Patient presents with  . Chest Pain    History of Present Illness:  Nicholas Rasmussen is a 71 y.o. male is referred by [name], NP, who is referred for evaluation of chest discomfort.  The patient is a very pleasant gentleman originally from [facility] but spending his entire adult life in [place] after graduating from [employer] in [place].  Over the past several months he is noted vague mild discomfort in the right upper chest area.  He feels as though he has to belch.  This discomfort occurs more after eating than at any other time.  It is not associated with dyspnea, radiation, diaphoresis.  He is physically active.  He likes Delphia Grates modern dance and is able to bend energy without precipitating the discomfort.  He can walk 3 flights of stairs without discomfort or dyspnea.  He is here for evaluation to be certain that is not cardiac.  There is no significant family history of vascular disease.    Past Medical History:  Diagnosis Date  . Allergic rhinitis 05/07/2014  . Dizziness 01/02/2016  . GERD (gastroesophageal reflux disease)   . Headache 03/13/2015  . Left flank pain 01/02/2016  . Low back pain 06/06/2015  . Pain of left side of body 08/13/2015  . Post herpetic neuralgia   . Routine general medical examination at a health care facility 05/07/2014  . Seizure disorder ([place]) 05/07/2014  . Seizures ([place])    Last seizure in the 70s.  . Shingles   . Stye 05/07/2014    Past Surgical History:  Procedure Laterality Date  . KNEE SURGERY Left    left miniscus  . uvala surgery      Current Medications: Outpatient Medications Prior to Visit  Medication Sig Dispense Refill  . divalproex (DEPAKOTE ER) 500 MG 24 hr tablet Take 1 tablet (500 mg total) by  mouth at bedtime. 90 tablet 3  . gabapentin (NEURONTIN) 600 MG tablet Take 600 mg by mouth at bedtime as needed (pain).    Marland Kitchen omeprazole (PRILOSEC) 40 MG capsule Take 1 capsule (40 mg total) by mouth daily. 30 capsule 3  . pantoprazole (PROTONIX) 40 MG tablet Take 40 mg by mouth 2 (two) times daily.  2  . gabapentin (NEURONTIN) 600 MG tablet Take 1 tablet (600 mg total) by mouth at bedtime. (Patient not taking: Reported on 05/11/2017) 30 tablet 0  . omeprazole (PRILOSEC) 20 MG capsule Take 1 capsule (20 mg total) by mouth daily. (Patient not taking: Reported on 05/11/2017) 90 capsule 0   No facility-administered medications prior to visit.      Allergies:   Patient has no known allergies.   Social History   Socioeconomic History  . Marital status: Single    Spouse name: None  . Number of children: 3  . Years of education: Associates  . Highest education level: None  Social Needs  . Financial resource strain: None  . Food insecurity - worry: None  . Food insecurity - inability: None  . Transportation needs - medical: None  . Transportation needs - non-medical: None  Occupational History  . Occupation: Retired  Tobacco Use  . Smoking status: Former Research scientist (life sciences)  . Smokeless tobacco: Never Used  Substance and Sexual Activity  . Alcohol use:  No    Alcohol/week: 0.0 oz  . Drug use: No  . Sexual activity: None  Other Topics Concern  . None  Social History Narrative   Lives alone.   Right-handed.   Rarely drinks caffeine.     Family History:  The patient's family history includes Diabetes in his father; Hypertension in his father; Other in his mother.   ROS:   Please see the history of present illness.    Indigestion.  Chills.  Low back discomfort.  Stress related to caring for both a invalid brother and his mother. All other systems reviewed and are negative.   PHYSICAL EXAM:   VS:  BP 112/68   Pulse (!) 59   Ht 6\' 4"  (1.93 m)   Wt 248 lb (112.5 kg)   BMI 30.19 kg/m      GEN: Well nourished, well developed, in no acute distress  HEENT: normal  Neck: no JVD, carotid bruits, or masses Cardiac: RRR; no murmurs, rubs, or gallops,no edema  Respiratory:  clear to auscultation bilaterally, normal work of breathing GI: soft, nontender, nondistended, + BS MS: no deformity or atrophy  Skin: warm and dry, no rash Neuro:  Alert and Oriented x 3, Strength and sensation are intact Psych: euthymic mood, full affect  Wt Readings from Last 3 Encounters:  05/11/17 248 lb (112.5 kg)  05/05/17 248 lb 3.2 oz (112.6 kg)  04/20/17 242 lb (109.8 kg)      Studies/Labs Reviewed:   EKG:  EKG sinus bradycardia, nonspecific ST abnormality, overall normal-appearing other than bradycardia.  Recent Labs: 01/19/2017: ALT 11; BUN 17; Creatinine, Ser 1.11; Hemoglobin 14.0; Platelets 201; Potassium 4.1; Sodium 139   Lipid Panel    Component Value Date/Time   CHOL 196 11/17/2016 1009   TRIG 89.0 11/17/2016 1009   HDL 49.40 11/17/2016 1009   CHOLHDL 4 11/17/2016 1009   VLDL 17.8 11/17/2016 1009   LDLCALC 128 (H) 11/17/2016 1009    Additional studies/ records that were reviewed today include:   Abdominal CT Scan 12/17:   IMPRESSION: No acute findings in the abdomen or pelvis.   Complex left renal cysts with rim calcifications. Small cysts scattered throughout the liver.   Aortic atherosclerosis.      ASSESSMENT:    1. Other chest pain   2. Aortic atherosclerosis ([place])   3. Hyperlipidemia with target LDL less than 100   4. Gastroesophageal reflux disease without esophagitis      PLAN:  In order of problems listed above:  1. Features are atypical.  Given the patient's age, documented aortic atherosclerosis, hyperlipidemia, will perform an exercise treadmill test to exclude possibility of underlying significant obstructive coronary disease.  My clinical impression is that the discomfort is either musculoskeletal or possibly GI related. 2. Monitor for symptoms  of intestinal ischemia.  Risk factor modification with LDL target less than 100 and preferably less than 70. 3. Target less than 100/ less than 70. 4. Not discussed  Overall patient seems to be doing relatively well.  Exercise treadmill test will help to exclude ischemia.    Medication Adjustments/Labs and Tests Ordered: Current medicines are reviewed at length with the patient today.  Concerns regarding medicines are outlined above.  Medication changes, Labs and Tests ordered today are listed in the Patient Instructions below. Patient Instructions  Medication Instructions:  Your physician recommends that you continue on your current medications as directed. Please refer to the Current Medication list given to you today.  Labwork: None  Testing/Procedures: Your physician has requested that you have an exercise tolerance test. For further information please visit [email or web address]. Please also follow instruction sheet, as given.   Follow-Up: Your physician recommends that you schedule a follow-up appointment as needed with Dr. Tamala Julian.    Any Other Special Instructions Will Be Listed Below (If Applicable).     If you need a refill on your cardiac medications before your next appointment, please call your pharmacy.      Signed, Sinclair Grooms, MD  05/11/2017 1:20 PM    [facility] [place], [place], [place]  [address] Phone: [phone]; Fax: [phone]

## 2017-05-11 NOTE — Patient Instructions (Signed)
Medication Instructions:  Your physician recommends that you continue on your current medications as directed. Please refer to the Current Medication list given to you today.  Labwork: None  Testing/Procedures: Your physician has requested that you have an exercise tolerance test. For further information please visit [email or web address]. Please also follow instruction sheet, as given.   Follow-Up: Your physician recommends that you schedule a follow-up appointment as needed with Dr. Smith.    Any Other Special Instructions Will Be Listed Below (If Applicable).     If you need a refill on your cardiac medications before your next appointment, please call your pharmacy.   

## 2017-05-13 ENCOUNTER — Ambulatory Visit: Payer: PPO | Admitting: Physician Assistant

## 2017-05-19 ENCOUNTER — Ambulatory Visit (INDEPENDENT_AMBULATORY_CARE_PROVIDER_SITE_OTHER): Payer: PPO

## 2017-05-19 DIAGNOSIS — R0789 Other chest pain: Secondary | ICD-10-CM | POA: Diagnosis not present

## 2017-05-19 LAB — EXERCISE TOLERANCE TEST
Estimated workload: 7.2 METS
Exercise duration (min): 6 min
Exercise duration (sec): 25 s
MPHR: 151 {beats}/min
Peak HR: 129 {beats}/min
Percent HR: 85 %
RPE: 17
Rest HR: 52 {beats}/min

## 2017-05-31 ENCOUNTER — Telehealth: Payer: Self-pay | Admitting: *Deleted

## 2017-05-31 ENCOUNTER — Encounter: Payer: Self-pay | Admitting: *Deleted

## 2017-05-31 DIAGNOSIS — I700 Atherosclerosis of aorta: Secondary | ICD-10-CM

## 2017-05-31 DIAGNOSIS — R9439 Abnormal result of other cardiovascular function study: Secondary | ICD-10-CM

## 2017-05-31 DIAGNOSIS — R079 Chest pain, unspecified: Secondary | ICD-10-CM

## 2017-05-31 MED ORDER — METOPROLOL TARTRATE 25 MG PO TABS: ORAL_TABLET | ORAL | 0 refills | Status: DC

## 2017-05-31 NOTE — Telephone Encounter (Signed)
Spoke with pt and went over results and recommendations per Dr. Tamala Julian.  Went over instructions for Coronary CT and pt verbalized understanding.  Spoke with Dr. Johnsie Cancel in regards to Metoprolol prior to CT as pt's HR typically 59-64.  Dr. Johnsie Cancel advised pt to only take Lopressor 25mg  prior to test instead of 50mg .  Sent medication to preferred pharmacy.  Advised pt insurance will be contacted for approval and then scheduling dept will contact him to schedule. Advised I will contact once scheduled so we can make an appointment with Dr. Tamala Julian.  Pt verbalized understanding and was in agreement with this plan.

## 2017-05-31 NOTE — Addendum Note (Signed)
Addended by: Loren Racer on: 05/31/2017 10:52 AM   Modules accepted: Orders

## 2017-05-31 NOTE — Telephone Encounter (Signed)
-----   Message from Belva Crome, MD sent at 05/30/2017 10:10 PM EST ----- Let the patient know stress test has mild abnormality and I recommend Coronary CT angio with morphology. Indication Aortic atherosclerosis, non specific CP, and indeterminate ETT. Needs OV thereafter. A copy will be sent to N[place], Charlene Brooke, NP

## 2017-06-02 ENCOUNTER — Telehealth: Payer: Self-pay | Admitting: Interventional Cardiology

## 2017-06-02 NOTE — Telephone Encounter (Signed)
Spoke with pt and he wanted to let me know that his insurance called to let him know that his Coronary CT had been approved.  I told him that I would let my [facility] team know and have them contact him with an appt.  Pt appreciative for call.

## 2017-06-02 NOTE — Telephone Encounter (Signed)
Follow Up:; ° ° °Returning your call. °

## 2017-06-03 ENCOUNTER — Encounter: Payer: Self-pay | Admitting: *Deleted

## 2017-06-07 ENCOUNTER — Telehealth: Payer: Self-pay | Admitting: Nurse Practitioner

## 2017-06-07 ENCOUNTER — Encounter: Payer: Self-pay | Admitting: Nurse Practitioner

## 2017-06-07 ENCOUNTER — Encounter: Payer: Self-pay | Admitting: *Deleted

## 2017-06-07 ENCOUNTER — Ambulatory Visit (INDEPENDENT_AMBULATORY_CARE_PROVIDER_SITE_OTHER): Payer: PPO | Admitting: Nurse Practitioner

## 2017-06-07 VITALS — BP 126/84 | HR 56 | Temp 98.2°F | Ht 76.0 in | Wt 235.0 lb

## 2017-06-07 DIAGNOSIS — K219 Gastro-esophageal reflux disease without esophagitis: Secondary | ICD-10-CM

## 2017-06-07 DIAGNOSIS — R0789 Other chest pain: Secondary | ICD-10-CM | POA: Diagnosis not present

## 2017-06-07 NOTE — Telephone Encounter (Signed)
Pt request an appointment for side pain. Appointment made.

## 2017-06-07 NOTE — Progress Notes (Signed)
Subjective:  Patient ID: Nicholas Rasmussen, male    DOB: 1945/11/26  Age: 72 y.o. MRN: [id number]  CC: Pain (side pain--discomfort near chest area,burping--going on for while. scheduled CT 06/22/17. )    HPI  Nicholas Rasmussen present for re eval of chest discomfort and belching. He is await CT by cardiology. He has not collected stool as instructed by GI. Current use of omeprazole and ranitidine with no change in symptoms. Denies any new symptoms. Has not made changes to diet. Symptoms do not affect ADLs. He thought GI was to tell him wen to collect stool.  Outpatient Medications Prior to Visit  Medication Sig Dispense Refill  . divalproex (DEPAKOTE ER) 500 MG 24 hr tablet Take 1 tablet (500 mg total) by mouth at bedtime. 90 tablet 3  . gabapentin (NEURONTIN) 600 MG tablet Take 600 mg by mouth at bedtime as needed (pain).    . metoprolol tartrate (LOPRESSOR) 25 MG tablet Take 1 tablet (25mg  total) by mouth one hour prior to CT. 1 tablet 0  . omeprazole (PRILOSEC) 40 MG capsule Take 1 capsule (40 mg total) by mouth daily. 30 capsule 3  . pantoprazole (PROTONIX) 40 MG tablet Take 40 mg by mouth 2 (two) times daily.  2   No facility-administered medications prior to visit.     ROS Review of Systems  Constitutional: Negative.   HENT: Negative.   Respiratory: Negative for cough and shortness of breath.   Cardiovascular: Negative for palpitations, orthopnea and leg swelling.  Musculoskeletal: Negative.   Skin: Negative.   Neurological: Negative.   Psychiatric/Behavioral: Negative.     Objective:  BP 126/84   Pulse (!) 56   Temp 98.2 F (36.8 C)   Ht 6\' 4"  (1.93 m)   Wt 235 lb (106.6 kg)   SpO2 97%   BMI 28.61 kg/m   BP Readings from Last 3 Encounters:  06/07/17 126/84  05/11/17 112/68  05/05/17 120/78    Wt Readings from Last 3 Encounters:  06/07/17 235 lb (106.6 kg)  05/11/17 248 lb (112.5 kg)  05/05/17 248 lb 3.2 oz (112.6 kg)    Physical Exam  Constitutional: He  is oriented to person, place, and time. No distress.  Neck: Normal range of motion. Neck supple. No thyromegaly present.  Cardiovascular: Normal rate, regular rhythm and normal heart sounds.  Pulmonary/Chest: Effort normal and breath sounds normal. He exhibits tenderness.  Left anterior wall tenderness. No chest wall mass, no crepitus  Musculoskeletal: He exhibits no edema.  Lymphadenopathy:    He has no cervical adenopathy.  Neurological: He is alert and oriented to person, place, and time.  Skin: Skin is warm and dry.  Psychiatric: He has a normal mood and affect. His behavior is normal.  Vitals reviewed.   Lab Results  Component Value Date   WBC 5.8 01/19/2017   HGB 14.0 01/19/2017   HCT 43.3 01/19/2017   PLT 201 01/19/2017   GLUCOSE 99 01/19/2017   CHOL 196 11/17/2016   TRIG 89.0 11/17/2016   HDL 49.40 11/17/2016   LDLCALC 128 (H) 11/17/2016   ALT 11 (L) 01/19/2017   AST 25 01/19/2017   NA 139 01/19/2017   K 4.1 01/19/2017   CL 102 01/19/2017   CREATININE 1.11 01/19/2017   BUN 17 01/19/2017   CO2 30 01/19/2017   TSH 1.08 11/12/2014   PSA 0.42 11/17/2016   HGBA1C 6.3 11/17/2016    Dg Chest 2 View  Result Date: 01/19/2017 CLINICAL DATA:  Chest pain.  EXAM: CHEST  2 VIEW COMPARISON:  Chest x-ray dated January 02, 2016. FINDINGS: The cardiomediastinal silhouette is normal in size. Normal pulmonary vascularity. No focal consolidation, pleural effusion, or pneumothorax. No acute osseous abnormality. Old left-sided rib fractures. IMPRESSION: No active cardiopulmonary disease. Electronically Signed   By: Titus Dubin M.D.   On: 01/19/2017 14:39    Assessment & Plan:   Nicholas Rasmussen was seen today for pain.  Diagnoses and all orders for this visit:  Atypical chest pain  Gastroesophageal reflux disease, esophagitis presence not specified   I am having Nicholas Rasmussen maintain his divalproex, omeprazole, pantoprazole, gabapentin, and metoprolol tartrate.  No orders of the  defined types were placed in this encounter.   Follow-up: Return if symptoms worsen or fail to improve.  [name], NP

## 2017-06-07 NOTE — Telephone Encounter (Signed)
This encounter was created in error - please disregard.

## 2017-06-07 NOTE — Telephone Encounter (Signed)
Noted  

## 2017-06-07 NOTE — Patient Instructions (Addendum)
Stop omeprazole, ranitidine, pantoprazole 01/15-01/29/19. Collect stool sample 06/21/17 and take to [facility] lab.  May use TUMs as needed for heartburn.  Avoid Aspirin, ibuprofen, naproxen.  Use only tylenol for pain.  Food Choices for Gastroesophageal Reflux Disease, Adult When you have gastroesophageal reflux disease (GERD), the foods you eat and your eating habits are very important. Choosing the right foods can help ease your discomfort. What guidelines do I need to follow?  Choose fruits, vegetables, whole grains, and low-fat dairy products.  Choose low-fat meat, fish, and poultry.  Limit fats such as oils, salad dressings, butter, nuts, and avocado.  Keep a food diary. This helps you identify foods that cause symptoms.  Avoid foods that cause symptoms. These may be different for everyone.  Eat small meals often instead of 3 large meals a day.  Eat your meals slowly, in a place where you are relaxed.  Limit fried foods.  Cook foods using methods other than frying.  Avoid drinking alcohol.  Avoid drinking large amounts of liquids with your meals.  Avoid bending over or lying down until 2-3 hours after eating. What foods are not recommended? These are some foods and drinks that may make your symptoms worse: Vegetables Tomatoes. Tomato juice. Tomato and spaghetti sauce. Chili peppers. Onion and garlic. Horseradish. Fruits Oranges, grapefruit, and lemon (fruit and juice). Meats High-fat meats, fish, and poultry. This includes hot dogs, ribs, ham, sausage, salami, and bacon. Dairy Whole milk and chocolate milk. Sour cream. Cream. Butter. Ice cream. Cream cheese. Drinks Coffee and tea. Bubbly (carbonated) drinks or energy drinks. Condiments Hot sauce. Barbecue sauce. Sweets/Desserts Chocolate and cocoa. Donuts. Peppermint and spearmint. Fats and Oils High-fat foods. This includes [place] fries and potato chips. Other Vinegar. Strong spices. This includes black pepper,  white pepper, red pepper, cayenne, curry powder, cloves, ginger, and chili powder. The items listed above may not be a complete list of foods and drinks to avoid. Contact your dietitian for more information. This information is not intended to replace advice given to you by your health care provider. Make sure you discuss any questions you have with your health care provider. Document Released: 11/09/2011 Document Revised: 10/16/2015 Document Reviewed: 03/14/2013 Elsevier Interactive Patient Education  2017 [employer].

## 2017-06-21 ENCOUNTER — Other Ambulatory Visit: Payer: Self-pay | Admitting: Emergency Medicine

## 2017-06-21 MED ORDER — PANTOPRAZOLE SODIUM 40 MG PO TBEC: 40.0000 mg | DELAYED_RELEASE_TABLET | Freq: Two times a day (BID) | ORAL | 2 refills | Status: DC

## 2017-06-22 ENCOUNTER — Ambulatory Visit (HOSPITAL_COMMUNITY)
Admission: RE | Admit: 2017-06-22 | Discharge: 2017-06-22 | Disposition: A | Discharge status: Home / Self Care | Payer: PPO | Source: Ambulatory Visit | Attending: Interventional Cardiology | Admitting: Interventional Cardiology

## 2017-06-22 ENCOUNTER — Ambulatory Visit (HOSPITAL_COMMUNITY): Payer: PPO

## 2017-06-22 DIAGNOSIS — R079 Chest pain, unspecified: Secondary | ICD-10-CM

## 2017-06-22 DIAGNOSIS — R9439 Abnormal result of other cardiovascular function study: Secondary | ICD-10-CM

## 2017-06-22 DIAGNOSIS — I700 Atherosclerosis of aorta: Secondary | ICD-10-CM | POA: Insufficient documentation

## 2017-06-22 LAB — POCT I-STAT CREATININE: Creatinine, Ser: 1.3 mg/dL — ABNORMAL HIGH (ref 0.61–1.24)

## 2017-06-22 MED ORDER — NITROGLYCERIN 0.4 MG SL SUBL
SUBLINGUAL_TABLET | SUBLINGUAL | Status: AC
  Filled 2017-06-22: qty 1

## 2017-06-23 ENCOUNTER — Other Ambulatory Visit: Payer: PPO

## 2017-06-23 ENCOUNTER — Telehealth: Payer: Self-pay | Admitting: Emergency Medicine

## 2017-06-23 DIAGNOSIS — R142 Eructation: Secondary | ICD-10-CM | POA: Diagnosis not present

## 2017-06-23 DIAGNOSIS — K219 Gastro-esophageal reflux disease without esophagitis: Secondary | ICD-10-CM

## 2017-06-23 NOTE — Telephone Encounter (Signed)
Spoke to patient and reminded him that he needs to go to the lab to have his stool test done. Patient confirmed that he has stopped the omeprazole and all stomach meds on 06/08/17. He states he will bring the sample in on Monday or Tuesday since he is busy today and tomorrow.

## 2017-06-27 ENCOUNTER — Telehealth: Payer: Self-pay | Admitting: *Deleted

## 2017-06-27 DIAGNOSIS — I2510 Atherosclerotic heart disease of native coronary artery without angina pectoris: Secondary | ICD-10-CM

## 2017-06-27 LAB — HELICOBACTER PYLORI  SPECIAL ANTIGEN
MICRO NUMBER:: 90133794
SPECIMEN QUALITY: ADEQUATE

## 2017-06-27 MED ORDER — ATORVASTATIN CALCIUM 20 MG PO TABS: 20.0000 mg | ORAL_TABLET | Freq: Every day | ORAL | 3 refills | Status: DC

## 2017-06-27 MED ORDER — METOPROLOL SUCCINATE ER 25 MG PO TB24: 25.0000 mg | ORAL_TABLET | Freq: Every day | ORAL | 3 refills | Status: DC

## 2017-06-27 NOTE — Telephone Encounter (Signed)
Spoke with pt and went over CT results and recommendations per Dr. Tamala Julian.  Scheduled labs for 08/09/17 and scheduled pt to see Dr. Tamala Julian 3/22.  Educated pt on what side effects to watch for with these medications.  Pt verbalized understanding and was in agreement with this plan.

## 2017-06-27 NOTE — Telephone Encounter (Signed)
-----   Message from Belva Crome, MD sent at 06/25/2017 10:30 AM EST ----- Let the patient know the test shows mild to moderate CAD which we can treat with medical therapy Includes good BP < 130/85 mmHg, LDL < 70, exercise, diet low in carbohydrates, Start Atorvastatin 20 mg daily and Metoprolol succinate 25 mg daily. Liver and lipid 6 weeks. Ov after blood work. A copy will be sent to [place], Charlene Brooke, NP

## 2017-07-04 ENCOUNTER — Telehealth: Payer: Self-pay | Admitting: Interventional Cardiology

## 2017-07-04 NOTE — Telephone Encounter (Signed)
Pt calling d/t fatigue and dizziness with taking Metoprolol.  Pt thought he remembered me mentioning these sx may occur.  Advised Metoprolol may cause these issues.  Advised to continue to monitor these sx since he is just getting to 1 week of this medicine.  Advised if no better in the next few days to contact the office and let me know.  Pt verbalized understanding and was in agreement with this plan.

## 2017-07-04 NOTE — Telephone Encounter (Signed)
New message     Pt c/o medication issue:  1. Name of Medication:  metoprolol succinate (TOPROL-XL) 25 MG 24 hr tablet Take 1 tablet (25 mg total) by mouth daily.   atorvastatin (LIPITOR) 20 MG tablet Take 1 tablet (20 mg total) by mouth daily.   2. How are you currently taking this medication (dosage and times per day)? Taking as prescribed   3. Are you having a reaction (difficulty breathing--STAT)? yes  4. What is your medication issue? Medication is making him tired and dizziness, due these side effects go away or get better or should they change meds

## 2017-07-13 ENCOUNTER — Ambulatory Visit: Payer: PPO | Admitting: Interventional Cardiology

## 2017-07-19 ENCOUNTER — Telehealth: Payer: Self-pay | Admitting: Interventional Cardiology

## 2017-07-19 NOTE — Telephone Encounter (Signed)
Returned call to patient who reports still feeling fatigued after starting metoprolol succinate 25 mg. Patient wanting to know if he needs to continue taking his metoprolol and atorvastatin. Made patient aware that Dr. Tamala Julian wanted to start him on these medicines after receiveing his Cardiac CT results that showed mild to moderate CAD. Advised patient that he needs to continue both and that he can try and take his metoprolol succinate at night to help with his fatigue.   Patient also reporting a "shooting pain" in the lower part of his chest upper part of his abdomen that happens after he eats. He states that it lasts for a few seconds and then goes away. He denies radiation, SOB, N/V, sweating, or any other symptoms. Patient denies any discomfort at this time. Patient states that he dances and that he walks up 3 flights of stairs at his apartment carrying groceries and he does not experience this discomfort. Patient states that his GI MD and PCP took him off of his acid reflux medicine prior to him providing a stool sample. Advised patient that he should reach out to his PCP/GI MD regarding restarting his acid reflux medicine.  Patient verbalized understanding and was agreement with the plan.

## 2017-07-19 NOTE — Telephone Encounter (Signed)
Pt c/o medication issue:  1. Name of Medication: Metoprolol 25 mg and Atorvastatin   2. How are you currently taking this medication (dosage and times per day)?   One tablet once a day for both medications   3. Are you having a reaction (difficulty breathing--STAT)? Feels tired, and every now and then a shooting pain in his chest   4. What is your medication issue? Wants to know should he keep taking the medication , or should something else be prescribed to him and is this supposed to happen .   Please call

## 2017-07-20 ENCOUNTER — Ambulatory Visit: Payer: PPO | Admitting: Interventional Cardiology

## 2017-07-26 ENCOUNTER — Encounter: Payer: Self-pay | Admitting: Neurology

## 2017-07-26 ENCOUNTER — Ambulatory Visit (INDEPENDENT_AMBULATORY_CARE_PROVIDER_SITE_OTHER): Payer: PPO | Admitting: Neurology

## 2017-07-26 VITALS — BP 118/69 | HR 51 | Ht 76.0 in | Wt 240.5 lb

## 2017-07-26 DIAGNOSIS — R519 Headache, unspecified: Secondary | ICD-10-CM

## 2017-07-26 DIAGNOSIS — G40909 Epilepsy, unspecified, not intractable, without status epilepticus: Secondary | ICD-10-CM | POA: Diagnosis not present

## 2017-07-26 DIAGNOSIS — R51 Headache: Secondary | ICD-10-CM | POA: Diagnosis not present

## 2017-07-26 NOTE — Progress Notes (Signed)
PATIENT: Nicholas Rasmussen DOB: 11-Sep-1945  Chief Complaint  Patient presents with  . Seizures    Pt denies any recent seizure activity.      HISTORICAL  Nicholas Rasmussen is a 72 year old right-handed male, seen in refer by his primary care doctor  Nicholas Rasmussen for evaluation of postherpetic neuralgia, initial evaluation was on March eighth 2018.  He had a history of epilepsy, first seizure was in high school, had about 4 generalized seizure in his lifetime, last seizure was in 1975, all seizure has similar seminology, he would woke up from sleep and felt body vibrating sensation, few seconds later, he would get into generalized tonic-clonic seizure, he was initially treated with Dilantin, after most recent recurrent seizure, he was switched to current dose of Depakote DR 250 mg twice a day, he tolerated the medication well, there was no seizure, no auras.  On July 13 2016, he noticed right frontal area headaches, which was quite unusual for him, next day, he noticed rash broke out, nerve pain, sensitive to touch, he was diagnosed with shingles, was treated with veltrex 1000 mg tid and tapering dose of prednisone for 1 week, rash lasted about 1 week, gradually resolved, for a while, he has sharp and neuropathic pain at right frontal region, difficulty sleeping.   Now his pain has much improved, he is not taking gabapentin 300 mg/600 mg, mild dizziness and lightheadedness with medications, he can sleep much better now  Personally reviewed CT head without contrast November 2017 that was normal. Laboratory evaluation normal CMP, CBC, hemoglobin was 12.5, A1c 6.2  UPDATE Feb 03 2017: Laboratory evaluation seeing August 2018 showed negative troponin, normal CMP, negative d-dimer, CBC, He rarely has right frontal shingles pain, only taking gabapentin occasionally, occasionally right frontal headache is resolved by over-the-counter NSAIDs,  UPDATE July 26 2017; He did very well, was  able to switch to Depakote ER 500 mg every night, tolerating it well, no recurrent seizure, no significant facial pain, only taking gabapentin as needed  REVIEW OF SYSTEMS: Full 14 system review of systems performed and notable only for Headache  ALLERGIES: No Known Allergies  HOME MEDICATIONS: Current Outpatient Medications  Medication Sig Dispense Refill  . atorvastatin (LIPITOR) 20 MG tablet Take 1 tablet (20 mg total) by mouth daily. 90 tablet 3  . divalproex (DEPAKOTE ER) 500 MG 24 hr tablet Take 1 tablet (500 mg total) by mouth at bedtime. 90 tablet 3  . gabapentin (NEURONTIN) 600 MG tablet Take 600 mg by mouth at bedtime as needed (pain).    . metoprolol succinate (TOPROL-XL) 25 MG 24 hr tablet Take 1 tablet (25 mg total) by mouth daily. 90 tablet 3   No current facility-administered medications for this visit.     PAST MEDICAL HISTORY: Past Medical History:  Diagnosis Date  . Allergic rhinitis 05/07/2014  . Dizziness 01/02/2016  . GERD (gastroesophageal reflux disease)   . Headache 03/13/2015  . Left flank pain 01/02/2016  . Low back pain 06/06/2015  . Pain of left side of body 08/13/2015  . Post herpetic neuralgia   . Routine general medical examination at a health care facility 05/07/2014  . Seizure disorder ([place]) 05/07/2014  . Seizures ([place])    Last seizure in the 70s.  . Shingles   . Stye 05/07/2014    PAST SURGICAL HISTORY: Past Surgical History:  Procedure Laterality Date  . KNEE SURGERY Left    left miniscus  . uvala surgery  FAMILY HISTORY: Family History  Problem Relation Age of Onset  . Diabetes Father   . Hypertension Father   . Other Mother        natural causes    SOCIAL HISTORY:  Social History   Socioeconomic History  . Marital status: Single    Spouse name: Not on file  . Number of children: 3  . Years of education: Associates  . Highest education level: Not on file  Social Needs  . Financial resource strain: Not on file  .  Food insecurity - worry: Not on file  . Food insecurity - inability: Not on file  . Transportation needs - medical: Not on file  . Transportation needs - non-medical: Not on file  Occupational History  . Occupation: Retired  Tobacco Use  . Smoking status: Former Research scientist (life sciences)  . Smokeless tobacco: Never Used  Substance and Sexual Activity  . Alcohol use: No    Alcohol/week: 0.0 oz  . Drug use: No  . Sexual activity: Not on file  Other Topics Concern  . Not on file  Social History Narrative   Lives alone.   Right-handed.   Rarely drinks caffeine.     PHYSICAL EXAM   Vitals:   07/26/17 1254  BP: 118/69  Pulse: (!) 51  Weight: 240 lb 8 oz (109.1 kg)  Height: 6\' 4"  (1.93 m)    Not recorded      Body mass index is 29.27 kg/m.  PHYSICAL EXAMNIATION:  Gen: NAD, conversant, well nourised, obese, well groomed                     Cardiovascular: Regular rate rhythm, no peripheral edema, warm, nontender. Eyes: Conjunctivae clear without exudates or hemorrhage Neck: Supple, no carotid bruits. Pulmonary: Clear to auscultation bilaterally   NEUROLOGICAL EXAM:  MENTAL STATUS: Speech:    Speech is normal; fluent and spontaneous with normal comprehension.  Cognition:     Orientation to time, place and person     Normal recent and remote memory     Normal Attention span and concentration     Normal Language, naming, repeating,spontaneous speech     Fund of knowledge   CRANIAL NERVES: CN II: Visual fields are full to confrontation. Fundoscopic exam is normal with sharp discs and no vascular changes. Pupils are round equal and briskly reactive to light. CN III, IV, VI: extraocular movement are normal. No ptosis. CN V: Facial sensation is intact to pinprick in all 3 divisions bilaterally. Corneal responses are intact.  CN VII: Face is symmetric with normal eye closure and smile. CN VIII: Hearing is normal to rubbing fingers CN IX, X: Palate elevates symmetrically. Phonation is  normal. CN XI: Head turning and shoulder shrug are intact CN XII: Tongue is midline with normal movements and no atrophy.  MOTOR: There is no pronator drift of out-stretched arms. Muscle bulk and tone are normal. Muscle strength is normal.  REFLEXES: Reflexes are 2+ and symmetric at the biceps, triceps, knees, and ankles. Plantar responses are flexor.  SENSORY: Intact to light touch, pinprick, positional sensation and vibratory sensation are intact in fingers and toes.  COORDINATION: Rapid alternating movements and fine finger movements are intact. There is no dysmetria on finger-to-nose and heel-knee-shin.    GAIT/STANCE: Posture is normal. Gait is steady with normal steps, base, arm swing, and turning. Heel and toe walking are normal. Tandem gait is normal.  Romberg is absent.   DIAGNOSTIC DATA (LABS, IMAGING, TESTING) - I  reviewed patient records, labs, notes, testing and imaging myself where available.   ASSESSMENT AND PLAN  Alvaro Aungst is a 72 y.o. male   Right V1 shingles in February 2018, mild postherpetic neuralgia pain  Has much improved, only take gabapentin 300mg  as needed  Epilepsy, last recurrence seizure in 1975  On low-dose Depakote ER 500 mg every night   Marcial Pacas, M.D. Ph.D.  [facility] [address], [place], [place] [address] Ph: [phone] Fax: [phone]  CC: Nicholas Rasmussen,

## 2017-08-03 ENCOUNTER — Encounter: Payer: Self-pay | Admitting: Nurse Practitioner

## 2017-08-03 ENCOUNTER — Ambulatory Visit (INDEPENDENT_AMBULATORY_CARE_PROVIDER_SITE_OTHER): Payer: PPO | Admitting: Nurse Practitioner

## 2017-08-03 VITALS — BP 118/70 | HR 46 | Temp 97.6°F | Ht 76.0 in | Wt 239.0 lb

## 2017-08-03 DIAGNOSIS — I25119 Atherosclerotic heart disease of native coronary artery with unspecified angina pectoris: Secondary | ICD-10-CM | POA: Insufficient documentation

## 2017-08-03 DIAGNOSIS — M2550 Pain in unspecified joint: Secondary | ICD-10-CM | POA: Diagnosis not present

## 2017-08-03 DIAGNOSIS — G8929 Other chronic pain: Secondary | ICD-10-CM | POA: Diagnosis not present

## 2017-08-03 DIAGNOSIS — M25562 Pain in left knee: Secondary | ICD-10-CM

## 2017-08-03 DIAGNOSIS — I2510 Atherosclerotic heart disease of native coronary artery without angina pectoris: Secondary | ICD-10-CM | POA: Insufficient documentation

## 2017-08-03 DIAGNOSIS — R739 Hyperglycemia, unspecified: Secondary | ICD-10-CM

## 2017-08-03 NOTE — Patient Instructions (Addendum)
Normal muscle enzymes. Stable Hgb A1c at 6.4. With recent CAD finding, I will recommend use of metformin along with diet changes to improve glucose control. You will be contacted by cardiology about lipid profile and hepatic function test.  Consider use of Co Q10 supplement to minimize muscle or joint pain.   DASH Eating Plan DASH stands for "Dietary Approaches to Stop Hypertension." The DASH eating plan is a healthy eating plan that has been shown to reduce high blood pressure (hypertension). It may also reduce your risk for type 2 diabetes, heart disease, and stroke. The DASH eating plan may also help with weight loss. What are tips for following this plan? General guidelines  Avoid eating more than 2,300 mg (milligrams) of salt (sodium) a day. If you have hypertension, you may need to reduce your sodium intake to 1,500 mg a day.  Limit alcohol intake to no more than 1 drink a day for nonpregnant women and 2 drinks a day for men. One drink equals 12 oz of beer, 5 oz of wine, or 1 oz of hard liquor.  Work with your health care provider to maintain a healthy body weight or to lose weight. Ask what an ideal weight is for you.  Get at least 30 minutes of exercise that causes your heart to beat faster (aerobic exercise) most days of the week. Activities may include walking, swimming, or biking.  Work with your health care provider or diet and nutrition specialist (dietitian) to adjust your eating plan to your individual calorie needs. Reading food labels  Check food labels for the amount of sodium per serving. Choose foods with less than 5 percent of the Daily Value of sodium. Generally, foods with less than 300 mg of sodium per serving fit into this eating plan.  To find whole grains, look for the word "whole" as the first word in the ingredient list. Shopping  Buy products labeled as "low-sodium" or "no salt added."  Buy fresh foods. Avoid canned foods and premade or frozen  meals. Cooking  Avoid adding salt when cooking. Use salt-free seasonings or herbs instead of table salt or sea salt. Check with your health care provider or pharmacist before using salt substitutes.  Do not fry foods. Cook foods using healthy methods such as baking, boiling, grilling, and broiling instead.  Cook with heart-healthy oils, such as olive, canola, soybean, or sunflower oil. Meal planning   Eat a balanced diet that includes: ? 5 or more servings of fruits and vegetables each day. At each meal, try to fill half of your plate with fruits and vegetables. ? Up to 6-8 servings of whole grains each day. ? Less than 6 oz of lean meat, poultry, or fish each day. A 3-oz serving of meat is about the same size as a deck of cards. One egg equals 1 oz. ? 2 servings of low-fat dairy each day. ? A serving of nuts, seeds, or beans 5 times each week. ? Heart-healthy fats. Healthy fats called Omega-3 fatty acids are found in foods such as flaxseeds and coldwater fish, like sardines, salmon, and mackerel.  Limit how much you eat of the following: ? Canned or prepackaged foods. ? Food that is high in trans fat, such as fried foods. ? Food that is high in saturated fat, such as fatty meat. ? Sweets, desserts, sugary drinks, and other foods with added sugar. ? Full-fat dairy products.  Do not salt foods before eating.  Try to eat at least 2 vegetarian  meals each week.  Eat more home-cooked food and less restaurant, buffet, and fast food.  When eating at a restaurant, ask that your food be prepared with less salt or no salt, if possible. What foods are recommended? The items listed may not be a complete list. Talk with your dietitian about what dietary choices are best for you. Grains Whole-grain or whole-wheat bread. Whole-grain or whole-wheat pasta. Brown rice. [place]. Bulgur. Whole-grain and low-sodium cereals. Pita bread. Low-fat, low-sodium crackers. Whole-wheat flour  tortillas. Vegetables Fresh or frozen vegetables (raw, steamed, roasted, or grilled). Low-sodium or reduced-sodium tomato and vegetable juice. Low-sodium or reduced-sodium tomato sauce and tomato paste. Low-sodium or reduced-sodium canned vegetables. Fruits All fresh, dried, or frozen fruit. Canned fruit in natural juice (without added sugar). Meat and other protein foods Skinless chicken or [place]. Ground chicken or [place]. Pork with fat trimmed off. Fish and seafood. Egg whites. Dried beans, peas, or lentils. Unsalted nuts, nut butters, and seeds. Unsalted canned beans. Lean cuts of beef with fat trimmed off. Low-sodium, lean deli meat. Dairy Low-fat (1%) or fat-free (skim) milk. Fat-free, low-fat, or reduced-fat cheeses. Nonfat, low-sodium ricotta or cottage cheese. Low-fat or nonfat yogurt. Low-fat, low-sodium cheese. Fats and oils Soft margarine without trans fats. Vegetable oil. Low-fat, reduced-fat, or light mayonnaise and salad dressings (reduced-sodium). Canola, safflower, olive, soybean, and sunflower oils. Avocado. Seasoning and other foods Herbs. Spices. Seasoning mixes without salt. Unsalted popcorn and pretzels. Fat-free sweets. What foods are not recommended? The items listed may not be a complete list. Talk with your dietitian about what dietary choices are best for you. Grains Baked goods made with fat, such as croissants, muffins, or some breads. Dry pasta or rice meal packs. Vegetables Creamed or fried vegetables. Vegetables in a cheese sauce. Regular canned vegetables (not low-sodium or reduced-sodium). Regular canned tomato sauce and paste (not low-sodium or reduced-sodium). Regular tomato and vegetable juice (not low-sodium or reduced-sodium). Angie Fava. Olives. Fruits Canned fruit in a light or heavy syrup. Fried fruit. Fruit in cream or butter sauce. Meat and other protein foods Fatty cuts of meat. Ribs. Fried meat. Berniece Salines. Sausage. Bologna and other processed lunch meats.  Salami. Fatback. Hotdogs. Bratwurst. Salted nuts and seeds. Canned beans with added salt. Canned or smoked fish. Whole eggs or egg yolks. Chicken or [place] with skin. Dairy Whole or 2% milk, cream, and half-and-half. Whole or full-fat cream cheese. Whole-fat or sweetened yogurt. Full-fat cheese. Nondairy creamers. Whipped toppings. Processed cheese and cheese spreads. Fats and oils Butter. Stick margarine. Lard. Shortening. Ghee. Bacon fat. Tropical oils, such as coconut, palm kernel, or palm oil. Seasoning and other foods Salted popcorn and pretzels. Onion salt, garlic salt, seasoned salt, table salt, and sea salt. Worcestershire sauce. Tartar sauce. Barbecue sauce. Teriyaki sauce. Soy sauce, including reduced-sodium. Steak sauce. Canned and packaged gravies. Fish sauce. Oyster sauce. Cocktail sauce. Horseradish that you find on the shelf. Ketchup. Mustard. Meat flavorings and tenderizers. Bouillon cubes. Hot sauce and Tabasco sauce. Premade or packaged marinades. Premade or packaged taco seasonings. Relishes. Regular salad dressings. Where to find more information:  National Heart, Lung, and [place]: https://wilson-eaton.com/  American Heart Association: www.heart.org Summary  The DASH eating plan is a healthy eating plan that has been shown to reduce high blood pressure (hypertension). It may also reduce your risk for type 2 diabetes, heart disease, and stroke.  With the DASH eating plan, you should limit salt (sodium) intake to 2,300 mg a day. If you have hypertension, you may need to  reduce your sodium intake to 1,500 mg a day.  When on the DASH eating plan, aim to eat more fresh fruits and vegetables, whole grains, lean proteins, low-fat dairy, and heart-healthy fats.  Work with your health care provider or diet and nutrition specialist (dietitian) to adjust your eating plan to your individual calorie needs. This information is not intended to replace advice given to you by your health  care provider. Make sure you discuss any questions you have with your health care provider. Document Released: 04/29/2011 Document Revised: 05/03/2016 Document Reviewed: 05/03/2016 Elsevier Interactive Patient Education  [employer].

## 2017-08-03 NOTE — Progress Notes (Signed)
Subjective:  Patient ID: Nicholas Rasmussen, male    DOB: 08-26-1945  Age: 72 y.o. MRN: [id number]  CC: Follow-up (Prediabetes/lab?)   HPI  [name] is her to review CT coronary and need for repeat hgbA1c. Due to mild to moderate CAD per CT, he was started on atorvastatin and metoprolol 3weeks ago. He is to have repeat lipid panel and hepatic panel. He will like to have done at once. He also noticed worsen knee and back in last 2weeks. He is concerned this may be due to use of atorvastatin. He is unsure of the type of diet he should follow. Denies any muscle weakness, no chest pain, no ABD pain, no nausea. Hx of left meniscus tear while in college. Use of left knee brace daily. Denies any recent injury.  Outpatient Medications Prior to Visit  Medication Sig Dispense Refill  . atorvastatin (LIPITOR) 20 MG tablet Take 1 tablet (20 mg total) by mouth daily. 90 tablet 3  . divalproex (DEPAKOTE ER) 500 MG 24 hr tablet Take 1 tablet (500 mg total) by mouth at bedtime. 90 tablet 3  . gabapentin (NEURONTIN) 600 MG tablet Take 600 mg by mouth at bedtime as needed (pain).    . metoprolol succinate (TOPROL-XL) 25 MG 24 hr tablet Take 1 tablet (25 mg total) by mouth daily. 90 tablet 3   No facility-administered medications prior to visit.     ROS See HPI  Objective:  BP 118/70   Pulse (!) 46   Temp 97.6 F (36.4 C)   Ht 6\' 4"  (1.93 m)   Wt 239 lb (108.4 kg)   SpO2 98%   BMI 29.09 kg/m   BP Readings from Last 3 Encounters:  08/03/17 118/70  07/26/17 118/69  06/22/17 139/90    Wt Readings from Last 3 Encounters:  08/03/17 239 lb (108.4 kg)  07/26/17 240 lb 8 oz (109.1 kg)  06/07/17 235 lb (106.6 kg)    Physical Exam  Constitutional: He is oriented to person, place, and time.  Cardiovascular: Normal rate.  Pulmonary/Chest: Effort normal.  Musculoskeletal: He exhibits no edema or tenderness.  Neurological: He is alert and oriented to person, place, and time.  Skin: No rash  noted.  Psychiatric: He has a normal mood and affect. His behavior is normal.  Vitals reviewed.   Lab Results  Component Value Date   WBC 5.8 01/19/2017   HGB 14.0 01/19/2017   HCT 43.3 01/19/2017   PLT 201 01/19/2017   GLUCOSE 96 08/04/2017   CHOL 123 08/04/2017   TRIG 53 08/04/2017   HDL 47 08/04/2017   LDLCALC 65 08/04/2017   ALT 6 08/04/2017   AST 16 08/04/2017   NA 144 08/04/2017   K 4.8 08/04/2017   CL 105 08/04/2017   CREATININE 1.08 08/04/2017   BUN 13 08/04/2017   CO2 27 08/04/2017   TSH 1.08 11/12/2014   PSA 0.42 11/17/2016   HGBA1C 6.4 08/04/2017    Ct Coronary Morph W/cta Cor W/score W/ca W/cm &/or Wo/cm  Addendum Date: 06/23/2017   ADDENDUM REPORT: 06/23/2017 08:15 CLINICAL DATA:  72 year old male with chest pain. EXAM: Cardiac/Coronary  CT TECHNIQUE: The patient was scanned on a [employer]. FINDINGS: A 120 kV prospective scan was triggered in the descending thoracic aorta at 111 HU's. Axial non-contrast 3 mm slices were carried out through the heart. The data set was analyzed on a dedicated work station and scored using the [place]. Gantry rotation speed was 250 msecs and  collimation was .6 mm. No beta blockade and 0.8 mg of sl NTG was given. The 3D data set was reconstructed in 5% intervals of the 67-82 % of the R-R cycle. Diastolic phases were analyzed on a dedicated work station using MPR, MIP and VRT modes. The patient received 80 cc of contrast. Aorta: Normal size. Minimal calcifications and atherosclerotic plaque. No dissection. Aortic Valve: Trileaflet. No calcifications. Dilated aortic root at the sinus level with maximum diameter 44 mm. Coronary Arteries:  Normal coronary origin.  Right dominance. RCA is a large dominant artery that gives rise to PDA and PLVB. There is mild calcified plaque in the mid RCA with associated stenosis 25-50%. Left main is a large and long vessel that has mild eccentric calcified plaque in the distal portion with  associated stenosis 0-25%. LM gives rise to LAD and LCX arteries. LAD is a large vessel that has mild diffuse predominantly calcified plaque in the proximal to mid portion with maximum stenosis 25-50%. LAD gives rise to two fairly small diagonal arteries. D1 has minimal plaque. D2 has minimal plaque. LCX is a non-dominant artery that gives rise to one OM1 branch. There is minimal non-calcified plaque. Other findings: Normal pulmonary vein drainage into the left atrium. Normal let atrial appendage without a thrombus. Normal size of the pulmonary artery. IMPRESSION: 1. Coronary calcium score of 151. This was 28 percentile for age and sex matched control. 2. Normal coronary origin with right dominance. 3. Mild CAD in the distal left main, proximal to mid LAD and mid RCA. Aggressive risk factor modification is recommended. 4. Dilated aortic root at the sinus level with maximum diameter 44 mm. Electronically Signed   By: Ena Dawley   On: 06/23/2017 08:15   Result Date: 06/23/2017 EXAM: OVER-READ INTERPRETATION  CT CHEST The following report is an over-read performed by radiologist Dr. Vinnie Langton of [facility], [place] on 06/22/2017. This over-read does not include interpretation of cardiac or coronary anatomy or pathology. The coronary calcium score/coronary CTA interpretation by the cardiologist is attached. COMPARISON:  None. FINDINGS: Aortic atherosclerosis. Within the visualized portions of the thorax there are no suspicious appearing pulmonary nodules or masses, there is no acute consolidative airspace disease, no pleural effusions, no pneumothorax and no lymphadenopathy. Visualized portions of the upper abdomen are unremarkable. There are no aggressive appearing lytic or blastic lesions noted in the visualized portions of the skeleton. IMPRESSION: 1.  Aortic Atherosclerosis (ICD10-I70.0). Electronically Signed: By: Vinnie Langton M.D. On: 06/22/2017 09:46    Assessment & Plan:   Aizik was  seen today for follow-up.  Diagnoses and all orders for this visit:  Hyperglycemia -     Hemoglobin A1c -     Discontinue: metFORMIN (GLUCOPHAGE-XR) 500 MG 24 hr tablet; Take 1 tablet (500 mg total) by mouth daily with breakfast. -     metFORMIN (GLUCOPHAGE-XR) 500 MG 24 hr tablet; Take 1 tablet (500 mg total) by mouth daily with breakfast.  Coronary artery disease involving native coronary artery of native heart with angina pectoris (HCC) -     Cancel: Hepatic function panel -     Cancel: Lipid Profile -     Discontinue: metFORMIN (GLUCOPHAGE-XR) 500 MG 24 hr tablet; Take 1 tablet (500 mg total) by mouth daily with breakfast. -     metFORMIN (GLUCOPHAGE-XR) 500 MG 24 hr tablet; Take 1 tablet (500 mg total) by mouth daily with breakfast.  Arthralgia, unspecified joint -     CK  Chronic pain of  left knee -     Ambulatory referral to Sports Medicine   I am having [name] maintain his divalproex, gabapentin, atorvastatin, metoprolol succinate, and metFORMIN.  Meds ordered this encounter  Medications  . DISCONTD: metFORMIN (GLUCOPHAGE-XR) 500 MG 24 hr tablet    Sig: Take 1 tablet (500 mg total) by mouth daily with breakfast.    Dispense:  30 tablet    Refill:  3    Order Specific Question:   Supervising Provider    Answer:   Lucille Passy [[id number]]  . metFORMIN (GLUCOPHAGE-XR) 500 MG 24 hr tablet    Sig: Take 1 tablet (500 mg total) by mouth daily with breakfast.    Dispense:  30 tablet    Refill:  5    Order Specific Question:   Supervising Provider    Answer:   Lucille Passy [[id number]]    Follow-up: Return in about 6 months (around 02/03/2018) for prediabetes.  [name], NP

## 2017-08-04 ENCOUNTER — Other Ambulatory Visit (INDEPENDENT_AMBULATORY_CARE_PROVIDER_SITE_OTHER): Payer: PPO

## 2017-08-04 DIAGNOSIS — R079 Chest pain, unspecified: Secondary | ICD-10-CM

## 2017-08-04 DIAGNOSIS — R9439 Abnormal result of other cardiovascular function study: Secondary | ICD-10-CM | POA: Diagnosis not present

## 2017-08-04 DIAGNOSIS — M25562 Pain in left knee: Secondary | ICD-10-CM | POA: Insufficient documentation

## 2017-08-04 DIAGNOSIS — I700 Atherosclerosis of aorta: Secondary | ICD-10-CM

## 2017-08-04 DIAGNOSIS — I2510 Atherosclerotic heart disease of native coronary artery without angina pectoris: Secondary | ICD-10-CM

## 2017-08-04 LAB — CK: Total CK: 118 U/L (ref 7–232)

## 2017-08-04 LAB — HEMOGLOBIN A1C: Hgb A1c MFr Bld: 6.4 % (ref 4.6–6.5)

## 2017-08-05 ENCOUNTER — Encounter: Payer: Self-pay | Admitting: Nurse Practitioner

## 2017-08-05 LAB — BASIC METABOLIC PANEL
BUN/Creatinine Ratio: 12 (ref 10–24)
BUN: 13 mg/dL (ref 8–27)
CO2: 27 mmol/L (ref 20–29)
Calcium: 9.2 mg/dL (ref 8.6–10.2)
Chloride: 105 mmol/L (ref 96–106)
Creatinine, Ser: 1.08 mg/dL (ref 0.76–1.27)
GFR calc Af Amer: 81 mL/min/{1.73_m2} (ref >59)
GFR calc non Af Amer: 70 mL/min/{1.73_m2} (ref >59)
Glucose: 96 mg/dL (ref 65–99)
Potassium: 4.8 mmol/L (ref 3.5–5.2)
Sodium: 144 mmol/L (ref 134–144)

## 2017-08-05 LAB — HEPATIC FUNCTION PANEL
ALT: 6 IU/L (ref 0–44)
AST: 16 IU/L (ref 0–40)
Albumin: 4.1 g/dL (ref 3.6–4.8)
Alkaline Phosphatase: 58 IU/L (ref 39–117)
Bilirubin Total: 0.4 mg/dL (ref 0.0–1.2)
Bilirubin, Direct: 0.14 mg/dL (ref 0.00–0.40)
Total Protein: 7 g/dL (ref 6.0–8.5)

## 2017-08-05 LAB — LIPID PANEL
Chol/HDL Ratio: 2.6 ratio (ref 0.0–5.0)
Cholesterol, Total: 123 mg/dL (ref 100–199)
HDL: 47 mg/dL (ref >39)
LDL Calculated: 65 mg/dL (ref 0–99)
Triglycerides: 53 mg/dL (ref 0–149)
VLDL Cholesterol Cal: 11 mg/dL (ref 5–40)

## 2017-08-05 MED ORDER — METFORMIN HCL ER 500 MG PO TB24: 500.0000 mg | ORAL_TABLET | Freq: Every day | ORAL | 5 refills | Status: DC

## 2017-08-05 MED ORDER — METFORMIN HCL ER 500 MG PO TB24: 500.0000 mg | ORAL_TABLET | Freq: Every day | ORAL | 3 refills | Status: DC

## 2017-08-08 ENCOUNTER — Ambulatory Visit (INDEPENDENT_AMBULATORY_CARE_PROVIDER_SITE_OTHER): Payer: PPO | Admitting: Family Medicine

## 2017-08-08 ENCOUNTER — Encounter: Payer: Self-pay | Admitting: Family Medicine

## 2017-08-08 ENCOUNTER — Ambulatory Visit: Payer: Self-pay

## 2017-08-08 VITALS — BP 136/88 | HR 64 | Temp 98.4°F | Ht 76.0 in | Wt 239.0 lb

## 2017-08-08 DIAGNOSIS — M545 Low back pain, unspecified: Secondary | ICD-10-CM

## 2017-08-08 DIAGNOSIS — M25562 Pain in left knee: Secondary | ICD-10-CM | POA: Diagnosis not present

## 2017-08-08 DIAGNOSIS — G8929 Other chronic pain: Secondary | ICD-10-CM | POA: Diagnosis not present

## 2017-08-08 NOTE — Assessment & Plan Note (Signed)
This appears to be more of a tracking problem than structural. Has minimal OA changes. Could have component of PF syndrome.  - counseled on HEP  - provided pennsaid samples  - counseled on ice and compression  - if no improvement consider xray and/or injection

## 2017-08-08 NOTE — Patient Instructions (Signed)
Please try ice and tylenol for pain  You can try riding a bike to help building up strength  Try the exercises that I have provided.  Follow up with me in 4-6 weeks if your pain has improved.

## 2017-08-08 NOTE — Progress Notes (Signed)
Nicholas Rasmussen - 72 y.o. male MRN [id number]  Date of birth: [birth year]  SUBJECTIVE:  Including CC & ROS.  Chief Complaint  Patient presents with  . Back Pain  . Left knee pain   Nicholas Rasmussen is a 72 y.o. male that is present for knee pain and lower back pain.  Left knee pain has been ongoing for one month. Located anteriorly. Pain is worse when he walks up the stairs. Pain is described as a sharp pain. He stays active and dances twice a week. Denies swelling and tenderness. Denies injury. He had a prior surgery for meniscus tear. He has been wearing a brace. Pain is localized to the knee.   Low back pain has been intermittent and localized in the lower back. Denies any injury or surgery. Pain is sharp in nature. No radicular symptoms.      Review of Systems  Constitutional: Negative for fever.  HENT: Negative for congestion.   Respiratory: Negative for cough.   Cardiovascular: Negative for chest pain.  Gastrointestinal: Negative for abdominal pain.  Musculoskeletal: Positive for back pain.  Skin: Negative for color change.  Allergic/Immunologic: Negative for immunocompromised state.  Neurological: Negative for weakness.  Hematological: Negative for adenopathy.  Psychiatric/Behavioral: Negative for agitation.    HISTORY: Past Medical, Surgical, Social, and Family History Reviewed & Updated per EMR.   Pertinent Historical Findings include:  Past Medical History:  Diagnosis Date  . Allergic rhinitis 05/07/2014  . Dizziness 01/02/2016  . GERD (gastroesophageal reflux disease)   . Headache 03/13/2015  . Left flank pain 01/02/2016  . Low back pain 06/06/2015  . Pain of left side of body 08/13/2015  . Post herpetic neuralgia   . Routine general medical examination at a health care facility 05/07/2014  . Seizure disorder ([place]) 05/07/2014  . Seizures ([place])    Last seizure in the 70s.  . Shingles   . Stye 05/07/2014    Past Surgical History:  Procedure Laterality Date  .  KNEE SURGERY Left    left miniscus  . uvala surgery      No Known Allergies  Family History  Problem Relation Age of Onset  . Diabetes Father   . Hypertension Father   . Other Mother        natural causes     Social History   Socioeconomic History  . Marital status: Single    Spouse name: Not on file  . Number of children: 3  . Years of education: Associates  . Highest education level: Not on file  Social Needs  . Financial resource strain: Not on file  . Food insecurity - worry: Not on file  . Food insecurity - inability: Not on file  . Transportation needs - medical: Not on file  . Transportation needs - non-medical: Not on file  Occupational History  . Occupation: Retired  Tobacco Use  . Smoking status: Former Research scientist (life sciences)  . Smokeless tobacco: Never Used  Substance and Sexual Activity  . Alcohol use: No    Alcohol/week: 0.0 oz  . Drug use: No  . Sexual activity: Not on file  Other Topics Concern  . Not on file  Social History Narrative   Lives alone.   Right-handed.   Rarely drinks caffeine.     PHYSICAL EXAM:  VS: BP 136/88 (BP Location: Left Arm, Patient Position: Sitting, Cuff Size: Normal)   Pulse 64   Temp 98.4 F (36.9 C) (Oral)   Ht 6\' 4"  (1.93 m)   Wt  239 lb (108.4 kg)   SpO2 98%   BMI 29.09 kg/m  Physical Exam Gen: NAD, alert, cooperative with exam, well-appearing ENT: normal lips, normal nasal mucosa,  Eye: normal EOM, normal conjunctiva and lids CV:  no edema, +2 pedal pulses   Resp: no accessory muscle use, non-labored,  Skin: no rashes, no areas of induration  Neuro: normal tone, normal sensation to touch Psych:  normal insight, alert and oriented MSK:  Left knee:  No effusion  No TTP of the medial or lateral joint line  Normal flexion and extension  Negative Thessaly test  No pain with patellar grind or compression  Hip abduction was strong.  Back:  TTP in the midline lumbar spine  Normal flexion and extension  Negative SLR  b/l  Neurovascularly intact    Limited ultrasound: left knee:  No fluid in the SPP  Normal QT and PT  Normal joint space in lateral joint line  Some outpouching of medial joint line   Summary: minimal joint space degenerative changes in the medial joint line.   Ultrasound and interpretation by Clearance Coots, MD     ASSESSMENT & PLAN:   Chronic midline low back pain without sciatica Appears to be axial in nature. Possible for component of facet arthritis.  - counseled on HEP  - if no improvement consider imaging and/or PT   Acute pain of left knee This appears to be more of a tracking problem than structural. Has minimal OA changes. Could have component of PF syndrome.  - counseled on HEP  - provided pennsaid samples  - counseled on ice and compression  - if no improvement consider xray and/or injection

## 2017-08-08 NOTE — Assessment & Plan Note (Signed)
Appears to be axial in nature. Possible for component of facet arthritis.  - counseled on HEP  - if no improvement consider imaging and/or PT

## 2017-08-09 ENCOUNTER — Other Ambulatory Visit: Payer: PPO

## 2017-08-11 NOTE — Progress Notes (Signed)
Cardiology Office Note    Date:  08/12/2017   ID:  Nicholas Rasmussen, DOB 07/17/1945, MRN [id number]  PCP:  Flossie Buffy, NP  Cardiologist: Sinclair Grooms, MD   Chief Complaint  Patient presents with  . Thoracic Aortic Aneurysm  . Coronary Artery Disease    History of Present Illness:  Nicholas Rasmussen is a 72 y.o. male with vague chest discomfort and moderate risk coronary artery calcification score 156. Coincidentally found ascending aortic aneurysm, 44 mm.  Asymptomatic nonobstructive coronary disease identified by coronary calcium score.  Coincidentally an ascending aortic aneurysm was identified measuring 4.4 cm as noted above.  The patient is asymptomatic.  Therapeutic adjustments have been made with the addition of atorvastatin 20 mg/day to keep LDL near 70 and metoprolol XL 25 mg/day to prevent aneurysm expansion.  He is tolerating the medications without any side effects.  Past Medical History:  Diagnosis Date  . Allergic rhinitis 05/07/2014  . Dizziness 01/02/2016  . GERD (gastroesophageal reflux disease)   . Headache 03/13/2015  . Left flank pain 01/02/2016  . Low back pain 06/06/2015  . Pain of left side of body 08/13/2015  . Post herpetic neuralgia   . Routine general medical examination at a health care facility 05/07/2014  . Seizure disorder ([place]) 05/07/2014  . Seizures ([place])    Last seizure in the 70s.  . Shingles   . Stye 05/07/2014    Past Surgical History:  Procedure Laterality Date  . KNEE SURGERY Left    left miniscus  . uvala surgery      Current Medications: Outpatient Medications Prior to Visit  Medication Sig Dispense Refill  . atorvastatin (LIPITOR) 20 MG tablet Take 1 tablet (20 mg total) by mouth daily. 90 tablet 3  . divalproex (DEPAKOTE ER) 500 MG 24 hr tablet Take 1 tablet (500 mg total) by mouth at bedtime. 90 tablet 3  . gabapentin (NEURONTIN) 600 MG tablet Take 600 mg by mouth at bedtime as needed (pain).    . metFORMIN  (GLUCOPHAGE-XR) 500 MG 24 hr tablet Take 1 tablet (500 mg total) by mouth daily with breakfast. 30 tablet 5  . metoprolol succinate (TOPROL-XL) 25 MG 24 hr tablet Take 1 tablet (25 mg total) by mouth daily. 90 tablet 3   No facility-administered medications prior to visit.      Allergies:   Patient has no known allergies.   Social History   Socioeconomic History  . Marital status: Single    Spouse name: Not on file  . Number of children: 3  . Years of education: Associates  . Highest education level: Not on file  Occupational History  . Occupation: Retired  Scientific laboratory technician  . Financial resource strain: Not on file  . Food insecurity:    Worry: Not on file    Inability: Not on file  . Transportation needs:    Medical: Not on file    Non-medical: Not on file  Tobacco Use  . Smoking status: Former Research scientist (life sciences)  . Smokeless tobacco: Never Used  Substance and Sexual Activity  . Alcohol use: No    Alcohol/week: 0.0 oz  . Drug use: No  . Sexual activity: Not on file  Lifestyle  . Physical activity:    Days per week: Not on file    Minutes per session: Not on file  . Stress: Not on file  Relationships  . Social connections:    Talks on phone: Not on file    Gets  together: Not on file    Attends religious service: Not on file    Active member of club or organization: Not on file    Attends meetings of clubs or organizations: Not on file    Relationship status: Not on file  Other Topics Concern  . Not on file  Social History Narrative   Lives alone.   Right-handed.   Rarely drinks caffeine.     Family History:  The patient's family history includes Diabetes in his father; Hypertension in his father; Other in his mother.   ROS:   Please see the history of present illness.    Reflux symptoms otherwise unremarkable. All other systems reviewed and are negative.   PHYSICAL EXAM:   VS:  BP 110/66   Pulse (!) 52   Ht 6\' 4"  (1.93 m)   Wt 240 lb 1.9 oz (108.9 kg)   BMI 29.23  kg/m    GEN: Well nourished, well developed, in no acute distress  HEENT: normal  Neck: no JVD, carotid bruits, or masses Cardiac: RRR; no murmurs, rubs, or gallops,no edema  Respiratory:  clear to auscultation bilaterally, normal work of breathing GI: soft, nontender, nondistended, + BS MS: no deformity or atrophy  Skin: warm and dry, no rash Neuro:  Alert and Oriented x 3, Strength and sensation are intact Psych: euthymic mood, full affect  Wt Readings from Last 3 Encounters:  08/12/17 240 lb 1.9 oz (108.9 kg)  08/08/17 239 lb (108.4 kg)  08/03/17 239 lb (108.4 kg)      Studies/Labs Reviewed:   EKG:  EKG  Not repeated  Recent Labs: 01/19/2017: Hemoglobin 14.0; Platelets 201 08/04/2017: ALT 6; BUN 13; Creatinine, Ser 1.08; Potassium 4.8; Sodium 144   Lipid Panel    Component Value Date/Time   CHOL 123 08/04/2017 0904   TRIG 53 08/04/2017 0904   HDL 47 08/04/2017 0904   CHOLHDL 2.6 08/04/2017 0904   CHOLHDL 4 11/17/2016 1009   VLDL 17.8 11/17/2016 1009   LDLCALC 65 08/04/2017 0904    Additional studies/ records that were reviewed today include:  Coronary CT angiogram January 2019: IMPRESSION: 1. Coronary calcium score of 151. This was 21 percentile for age and sex matched control.  2. Normal coronary origin with right dominance.  3. Mild CAD in the distal left main, proximal to mid LAD and mid RCA. Aggressive risk factor modification is recommended.  4. Dilated aortic root at the sinus level with maximum diameter 44 mm.   Exercise treadmill test 05/19/17: Study Highlights    Blood pressure demonstrated a normal response to exercise.  There was 95mm of horizontal ST depression in the inferolateral leads at peak exercise and in recovery.  The patient achieved 7.2 mets and 85% PMHR.  Mildly impaired exercise capacity.  Consider nuclear stress test for further evaluation.     ASSESSMENT:    1. Coronary artery disease involving native coronary  artery of native heart with angina pectoris ([place])   2. Ascending aortic aneurysm ([place])   3. Hyperlipidemia with target LDL less than 70   4. Type 2 diabetes mellitus without complication, without long-term current use of insulin (H[facility])      PLAN:  In order of problems listed above:  1. Discussed the findings on coronary CT.  He is asymptomatic.  Secondary prevention including lipid, blood pressure, and glycemic control. 2. Low-dose metoprolol is been started after finding an asymptomatic 4.4 cm enlargement of the aorta.  2D Doppler echocardiogram in 12 months  to reassess size of aorta.  Continue metoprolol succinate 25 mg/day. 3. LDL target less than 70.  On current therapy the LDL most recently was 65.  Lipid panel and liver panel should be done again in 1 year. 4. Hemoglobin A1c target less than 7.  Clinical follow-up in 1 year with 2D Doppler echocardiogram preceding office visit to reassess aortic size.    Medication Adjustments/Labs and Tests Ordered: Current medicines are reviewed at length with the patient today.  Concerns regarding medicines are outlined above.  Medication changes, Labs and Tests ordered today are listed in the Patient Instructions below. Patient Instructions  Medication Instructions:  Your physician recommends that you continue on your current medications as directed. Please refer to the Current Medication list given to you today.  Labwork: None  Testing/Procedures: Your physician has requested that you have an echocardiogram in one year prior to seeing Dr. Tamala Julian. Echocardiography is a painless test that uses sound waves to create images of your heart. It provides your doctor with information about the size and shape of your heart and how well your heart's chambers and valves are working. This procedure takes approximately one hour. There are no restrictions for this procedure.   Follow-Up: Your physician wants you to follow-up in: 1 year with Dr. Tamala Julian.  You  will receive a reminder letter in the mail two months in advance. If you don't receive a letter, please call our office to schedule the follow-up appointment.   Any Other Special Instructions Will Be Listed Below (If Applicable).     If you need a refill on your cardiac medications before your next appointment, please call your pharmacy.      Signed, Sinclair Grooms, MD  08/12/2017 10:15 AM    [facility] [address], [place], [place]  [address] Phone: [phone]; Fax: [phone]

## 2017-08-12 ENCOUNTER — Encounter: Payer: Self-pay | Admitting: Interventional Cardiology

## 2017-08-12 ENCOUNTER — Ambulatory Visit: Payer: PPO | Admitting: Interventional Cardiology

## 2017-08-12 VITALS — BP 110/66 | HR 52 | Ht 76.0 in | Wt 240.1 lb

## 2017-08-12 DIAGNOSIS — I712 Thoracic aortic aneurysm, without rupture: Secondary | ICD-10-CM

## 2017-08-12 DIAGNOSIS — E119 Type 2 diabetes mellitus without complications: Secondary | ICD-10-CM | POA: Diagnosis not present

## 2017-08-12 DIAGNOSIS — I25119 Atherosclerotic heart disease of native coronary artery with unspecified angina pectoris: Secondary | ICD-10-CM | POA: Diagnosis not present

## 2017-08-12 DIAGNOSIS — I7121 Aneurysm of the ascending aorta, without rupture: Secondary | ICD-10-CM

## 2017-08-12 DIAGNOSIS — E785 Hyperlipidemia, unspecified: Secondary | ICD-10-CM

## 2017-08-12 NOTE — Patient Instructions (Signed)
Medication Instructions:  Your physician recommends that you continue on your current medications as directed. Please refer to the Current Medication list given to you today.  Labwork: None  Testing/Procedures: Your physician has requested that you have an echocardiogram in one year prior to seeing Dr. Tamala Julian. Echocardiography is a painless test that uses sound waves to create images of your heart. It provides your doctor with information about the size and shape of your heart and how well your heart's chambers and valves are working. This procedure takes approximately one hour. There are no restrictions for this procedure.   Follow-Up: Your physician wants you to follow-up in: 1 year with Dr. Tamala Julian.  You will receive a reminder letter in the mail two months in advance. If you don't receive a letter, please call our office to schedule the follow-up appointment.   Any Other Special Instructions Will Be Listed Below (If Applicable).     If you need a refill on your cardiac medications before your next appointment, please call your pharmacy.

## 2017-08-30 ENCOUNTER — Ambulatory Visit: Payer: Self-pay | Admitting: *Deleted

## 2017-08-30 NOTE — Telephone Encounter (Signed)
Pt reports mild lightheadedness, intermittent, onset Sunday. Not positional, denies any other symptoms except right ear "clogged." States feeling more fatigued than usual. Reports staying hydrated. States medications changed recently.  Pt requests afternoon appt tomorrow. 30 minute appt at 3:00 changed to 15 minutes with approval of [place]. Pt instructed to go to UC/ED if symptoms worsen.  Reason for Disposition . [1] MILD dizziness (e.g., walking normally) AND [2] has NOT been evaluated by physician for this  (Exception: dizziness caused by heat exposure, sudden standing, or poor fluid intake)  Answer Assessment - Initial Assessment Questions 1. DESCRIPTION: "Describe your dizziness."     Lightheaded 2. LIGHTHEADED: "Do you feel lightheaded?" (e.g., somewhat faint, woozy, weak upon standing)     Intermittent 3. VERTIGO: "Do you feel like either you or the room is spinning or tilting?" (i.e. vertigo)     no 4. SEVERITY: "How bad is it?"  "Do you feel like you are going to faint?" "Can you stand and walk?"   - MILD - walking normally   - MODERATE - interferes with normal activities (e.g., work, school)    - SEVERE - unable to stand, requires support to walk, feels like passing out now.      mild 5. ONSET:  "When did the dizziness begin?"     Sunday 6. AGGRAVATING FACTORS: "Does anything make it worse?" (e.g., standing, change in head position)     no 7. HEART RATE: "Can you tell me your heart rate?" "How many beats in 15 seconds?"  (Note: not all patients can do this)        8. CAUSE: "What do you think is causing the dizziness?"     Possibly medication changes 9. RECURRENT SYMPTOM: "Have you had dizziness before?" If so, ask: "When was the last time?" "What happened that time?"     no 10. OTHER SYMPTOMS: "Do you have any other symptoms?" (e.g., fever, chest pain, vomiting, diarrhea, bleeding)       no  Protocols used: DIZZINESS [facility]

## 2017-08-31 ENCOUNTER — Encounter: Payer: Self-pay | Admitting: Nurse Practitioner

## 2017-08-31 ENCOUNTER — Ambulatory Visit (INDEPENDENT_AMBULATORY_CARE_PROVIDER_SITE_OTHER): Payer: PPO | Admitting: Nurse Practitioner

## 2017-08-31 VITALS — BP 114/70 | HR 59 | Temp 97.4°F | Ht 76.0 in | Wt 238.6 lb

## 2017-08-31 DIAGNOSIS — H6983 Other specified disorders of Eustachian tube, bilateral: Secondary | ICD-10-CM | POA: Diagnosis not present

## 2017-08-31 MED ORDER — FLUTICASONE PROPIONATE 50 MCG/ACT NA SUSP: 2.0000 | Freq: Every day | NASAL | 0 refills | Status: DC

## 2017-08-31 MED ORDER — CETIRIZINE HCL 10 MG PO TABS: 10.0000 mg | ORAL_TABLET | Freq: Every day | ORAL | 0 refills | Status: DC

## 2017-08-31 NOTE — Progress Notes (Signed)
Subjective:  Patient ID: Nicholas Rasmussen, male    DOB: [birth year]  Age: 72 y.o. MRN: [id number]  CC: Dizziness (Sunday felt lightheaded,  right ear feels "plugged". slight headaches come and go.)  Dizziness  This is a new problem. The current episode started in the past 7 days. The problem occurs intermittently. The problem has been gradually improving. Associated symptoms include vertigo. Pertinent negatives include no anorexia, diaphoresis, fatigue, fever, headaches, nausea, neck pain, numbness, rash, sore throat or visual change. Exacerbated by: head movement. Treatments tried: gabapentin. The treatment provided mild relief.    Outpatient Medications Prior to Visit  Medication Sig Dispense Refill  . atorvastatin (LIPITOR) 20 MG tablet Take 1 tablet (20 mg total) by mouth daily. 90 tablet 3  . divalproex (DEPAKOTE ER) 500 MG 24 hr tablet Take 1 tablet (500 mg total) by mouth at bedtime. 90 tablet 3  . gabapentin (NEURONTIN) 600 MG tablet Take 600 mg by mouth at bedtime as needed (pain).    . metFORMIN (GLUCOPHAGE-XR) 500 MG 24 hr tablet Take 1 tablet (500 mg total) by mouth daily with breakfast. 30 tablet 5  . metoprolol succinate (TOPROL-XL) 25 MG 24 hr tablet Take 1 tablet (25 mg total) by mouth daily. 90 tablet 3   No facility-administered medications prior to visit.     ROS See HPI  Objective:  BP 114/70 (BP Location: Left Arm, Patient Position: Sitting, Cuff Size: Normal)   Pulse (!) 59   Temp (!) 97.4 F (36.3 C) (Oral)   Ht 6\' 4"  (1.93 m)   Wt 238 lb 9.6 oz (108.2 kg)   SpO2 98%   BMI 29.04 kg/m   BP Readings from Last 3 Encounters:  08/31/17 114/70  08/12/17 110/66  08/08/17 136/88    Wt Readings from Last 3 Encounters:  08/31/17 238 lb 9.6 oz (108.2 kg)  08/12/17 240 lb 1.9 oz (108.9 kg)  08/08/17 239 lb (108.4 kg)    Physical Exam  Constitutional: No distress.  HENT:  Right Ear: External ear and ear canal normal. No drainage. No mastoid tenderness.  Tympanic membrane is not injected, not erythematous and not bulging. A middle ear effusion is present.  Left Ear: External ear and ear canal normal. No drainage. No mastoid tenderness. Tympanic membrane is not injected, not erythematous and not bulging. A middle ear effusion is present.  Eyes: Pupils are equal, round, and reactive to light. Conjunctivae and EOM are normal.  Neck: Normal range of motion. Neck supple. No thyromegaly present.  Cardiovascular: Normal rate.  Pulmonary/Chest: Effort normal.  Lymphadenopathy:    He has no cervical adenopathy.  Vitals reviewed.   Lab Results  Component Value Date   WBC 5.8 01/19/2017   HGB 14.0 01/19/2017   HCT 43.3 01/19/2017   PLT 201 01/19/2017   GLUCOSE 96 08/04/2017   CHOL 123 08/04/2017   TRIG 53 08/04/2017   HDL 47 08/04/2017   LDLCALC 65 08/04/2017   ALT 6 08/04/2017   AST 16 08/04/2017   NA 144 08/04/2017   K 4.8 08/04/2017   CL 105 08/04/2017   CREATININE 1.08 08/04/2017   BUN 13 08/04/2017   CO2 27 08/04/2017   TSH 1.08 11/12/2014   PSA 0.42 11/17/2016   HGBA1C 6.4 08/04/2017    Ct Coronary Morph W/cta Cor W/score W/ca W/cm &/or Wo/cm  Addendum Date: 06/23/2017   ADDENDUM REPORT: 06/23/2017 08:15 CLINICAL DATA:  72 year old male with chest pain. EXAM: Cardiac/Coronary  CT TECHNIQUE: The patient was scanned  on a [employer]. FINDINGS: A 120 kV prospective scan was triggered in the descending thoracic aorta at 111 HU's. Axial non-contrast 3 mm slices were carried out through the heart. The data set was analyzed on a dedicated work station and scored using the [place]. Gantry rotation speed was 250 msecs and collimation was .6 mm. No beta blockade and 0.8 mg of sl NTG was given. The 3D data set was reconstructed in 5% intervals of the 67-82 % of the R-R cycle. Diastolic phases were analyzed on a dedicated work station using MPR, MIP and VRT modes. The patient received 80 cc of contrast. Aorta: Normal size.  Minimal calcifications and atherosclerotic plaque. No dissection. Aortic Valve: Trileaflet. No calcifications. Dilated aortic root at the sinus level with maximum diameter 44 mm. Coronary Arteries:  Normal coronary origin.  Right dominance. RCA is a large dominant artery that gives rise to PDA and PLVB. There is mild calcified plaque in the mid RCA with associated stenosis 25-50%. Left main is a large and long vessel that has mild eccentric calcified plaque in the distal portion with associated stenosis 0-25%. LM gives rise to LAD and LCX arteries. LAD is a large vessel that has mild diffuse predominantly calcified plaque in the proximal to mid portion with maximum stenosis 25-50%. LAD gives rise to two fairly small diagonal arteries. D1 has minimal plaque. D2 has minimal plaque. LCX is a non-dominant artery that gives rise to one OM1 branch. There is minimal non-calcified plaque. Other findings: Normal pulmonary vein drainage into the left atrium. Normal let atrial appendage without a thrombus. Normal size of the pulmonary artery. IMPRESSION: 1. Coronary calcium score of 151. This was 36 percentile for age and sex matched control. 2. Normal coronary origin with right dominance. 3. Mild CAD in the distal left main, proximal to mid LAD and mid RCA. Aggressive risk factor modification is recommended. 4. Dilated aortic root at the sinus level with maximum diameter 44 mm. Electronically Signed   By: Ena Dawley   On: 06/23/2017 08:15   Result Date: 06/23/2017 EXAM: OVER-READ INTERPRETATION  CT CHEST The following report is an over-read performed by radiologist Dr. Vinnie Langton of [facility], [place] on 06/22/2017. This over-read does not include interpretation of cardiac or coronary anatomy or pathology. The coronary calcium score/coronary CTA interpretation by the cardiologist is attached. COMPARISON:  None. FINDINGS: Aortic atherosclerosis. Within the visualized portions of the thorax there are no  suspicious appearing pulmonary nodules or masses, there is no acute consolidative airspace disease, no pleural effusions, no pneumothorax and no lymphadenopathy. Visualized portions of the upper abdomen are unremarkable. There are no aggressive appearing lytic or blastic lesions noted in the visualized portions of the skeleton. IMPRESSION: 1.  Aortic Atherosclerosis (ICD10-I70.0). Electronically Signed: By: Vinnie Langton M.D. On: 06/22/2017 09:46    Assessment & Plan:   Nicholas Rasmussen was seen today for dizziness.  Diagnoses and all orders for this visit:  Eustachian tube dysfunction, bilateral -     fluticasone (FLONASE) 50 MCG/ACT nasal spray; Place 2 sprays into both nostrils daily. -     cetirizine (ZYRTEC) 10 MG tablet; Take 1 tablet (10 mg total) by mouth daily.   I am having Nicholas Rasmussen start on fluticasone and cetirizine. I am also having him maintain his divalproex, gabapentin, atorvastatin, metoprolol succinate, and metFORMIN.  Meds ordered this encounter  Medications  . fluticasone (FLONASE) 50 MCG/ACT nasal spray    Sig: Place 2 sprays into both nostrils daily.  Dispense:  16 g    Refill:  0    Order Specific Question:   Supervising Provider    Answer:   Lucille Passy [[id number]]  . cetirizine (ZYRTEC) 10 MG tablet    Sig: Take 1 tablet (10 mg total) by mouth daily.    Dispense:  30 tablet    Refill:  0    Order Specific Question:   Supervising Provider    Answer:   Lucille Passy [[id number]]    Follow-up: No follow-ups on file.  [name], NP

## 2017-08-31 NOTE — Patient Instructions (Signed)
Barotitis Media Barotitis media is inflammation of the middle ear. This condition occurs when an auditory tube (eustachian tube) is blocked in one or both ears. These tubes lead from the middle ear to the back of the nose (nasopharynx). This condition typically occurs when you experience changes in pressure, such as when flying or scuba diving. Untreated barotitis media may lead to damage or hearing loss (barotrauma), which may become permanent. What are the causes? This condition may be caused by changes in air pressure from:  Flying.  Scuba diving.  A nearby explosion. What increases the risk? The following factors may make you more likely to develop this condition:  Middle ear infection.  Sinus infection.  A cold.  Environmental allergies.  Small eustachian tubes.  Recent ear surgery. What are the signs or symptoms? Symptoms of this condition may include:  Ear pain.  Hearing loss. In severe cases, symptoms can include:  Dizziness and nausea (vertigo).  Temporary facial paralysis. How is this diagnosed? This condition is diagnosed based on:  A physical exam. Your health care provider may:  Use a device (otoscope) to look into your ear canal and check your eardrum.  Do a test that changes air pressure in the middle ear to check how well the eardrum moves and to see if the eustachian tube is working(tympanogram).  Your medical history. In some cases, your health care provider may have you take a hearing test. You may also be referred to someone who specializes in ear treatment (otolaryngologist, "ENT"). How is this treated? This condition may be treated with:  Medicines to relieve congestion in your nose, sinus, or upper respiratory tract (decongestants).  Techniques to equalize pressure (to "pop" your ears), such as:  Yawning.  Chewing gum.  Swallowing. In severe cases, you may need surgery to relieve your symptoms or to prevent future inflammation. Follow  these instructions at home:  Take over-the-counter and prescription medicines only as told by your health care provider.  Do not put anything into your ears to clean or unplug them. Ear drops will not help.  Keep all follow-up visits as told by your health care provider. This is important. How is this prevented? Using these strategies may help to prevent barotitis media:  Chewing gum with frequent, forceful swallowing during takeoff and landing when flying.  Holding your nose and gently blowing to pop your ears for equalizing pressure changes. This forces air into the eustachian tube.  Yawning during air pressure changes.  Using a nasal decongestant about 30-60 minutes before flying, if you have nasal congestion. Contact a health care provider if:  You have vertigo.  You have hearing loss.  Your symptoms do not get better or they get worse.  You have a fever. Get help right away if:  You have a severe headache, ear pain, and dizziness.  You have balance problems.  You cannot move or feel part of your face.  You have bloody or pus-like drainage from your ears. Summary  Barotitis media is inflammation of the middle ear.  This condition typically occurs when you experience changes in pressure, such as when flying or scuba diving.  You may be at a higher risk for this condition if you have small eustachian tubes, had recent ear surgery, or have allergies, a cold, or sinus or middle ear infection.  This condition may be treated with medicines or techniques to equalize pressure in your ears.  Strategies can be used to help prevent barotitis media. This information is   not intended to replace advice given to you by your health care provider. Make sure you discuss any questions you have with your health care provider. Document Released: 05/07/2000 Document Revised: 03/29/2016 Document Reviewed: 03/29/2016 Elsevier Interactive Patient Education  2017 [employer]  

## 2017-09-07 ENCOUNTER — Ambulatory Visit (INDEPENDENT_AMBULATORY_CARE_PROVIDER_SITE_OTHER): Payer: PPO | Admitting: Nurse Practitioner

## 2017-09-07 ENCOUNTER — Encounter: Payer: Self-pay | Admitting: Nurse Practitioner

## 2017-09-07 VITALS — BP 100/60 | HR 55 | Temp 97.6°F | Ht 76.0 in | Wt 238.2 lb

## 2017-09-07 DIAGNOSIS — D171 Benign lipomatous neoplasm of skin and subcutaneous tissue of trunk: Secondary | ICD-10-CM

## 2017-09-07 DIAGNOSIS — M546 Pain in thoracic spine: Secondary | ICD-10-CM | POA: Diagnosis not present

## 2017-09-07 MED ORDER — DICLOFENAC SODIUM 2 % TD SOLN: 1.0000 "application " | Freq: Two times a day (BID) | TRANSDERMAL | 0 refills | Status: DC

## 2017-09-07 NOTE — Progress Notes (Signed)
Subjective:  Patient ID: Nicholas Rasmussen, male    DOB: Jun 21, 1945  Age: 72 y.o. MRN: [id number]  CC: Back Pain (mid back pain radiating to the side started yesterday. has had pain/knot in back before.)  Back Pain  This is a new problem. The current episode started yesterday. The problem occurs intermittently. The problem is unchanged. The pain is present in the thoracic spine. The quality of the pain is described as aching. The pain does not radiate. The pain is the same all the time. The symptoms are aggravated by bending and twisting. Pertinent negatives include no abdominal pain, bladder incontinence, bowel incontinence, chest pain, dysuria, fever, headaches, numbness, paresis, paresthesias, pelvic pain, perianal numbness, tingling, weakness or weight loss. He has tried nothing for the symptoms.   Outpatient Medications Prior to Visit  Medication Sig Dispense Refill  . atorvastatin (LIPITOR) 20 MG tablet Take 1 tablet (20 mg total) by mouth daily. 90 tablet 3  . cetirizine (ZYRTEC) 10 MG tablet Take 1 tablet (10 mg total) by mouth daily. 30 tablet 0  . divalproex (DEPAKOTE ER) 500 MG 24 hr tablet Take 1 tablet (500 mg total) by mouth at bedtime. 90 tablet 3  . fluticasone (FLONASE) 50 MCG/ACT nasal spray Place 2 sprays into both nostrils daily. 16 g 0  . gabapentin (NEURONTIN) 600 MG tablet Take 600 mg by mouth at bedtime as needed (pain).    . metFORMIN (GLUCOPHAGE-XR) 500 MG 24 hr tablet Take 1 tablet (500 mg total) by mouth daily with breakfast. 30 tablet 5  . metoprolol succinate (TOPROL-XL) 25 MG 24 hr tablet Take 1 tablet (25 mg total) by mouth daily. 90 tablet 3   No facility-administered medications prior to visit.     ROS See HPI  Objective:  BP 100/60 (BP Location: Left Arm, Patient Position: Sitting, Cuff Size: Normal)   Pulse (!) 55   Temp 97.6 F (36.4 C) (Oral)   Ht 6\' 4"  (1.93 m)   Wt 238 lb 3.2 oz (108 kg)   SpO2 96%   BMI 28.99 kg/m   BP Readings from Last 3  Encounters:  09/07/17 100/60  08/31/17 114/70  08/12/17 110/66    Wt Readings from Last 3 Encounters:  09/07/17 238 lb 3.2 oz (108 kg)  08/31/17 238 lb 9.6 oz (108.2 kg)  08/12/17 240 lb 1.9 oz (108.9 kg)    Physical Exam  Constitutional: He is oriented to person, place, and time. No distress.  Neck: Normal range of motion. Neck supple.  Cardiovascular: Normal rate.  Pulmonary/Chest: Effort normal.  Abdominal: Soft. Bowel sounds are normal. He exhibits no distension and no mass. There is no tenderness. There is no guarding.  Musculoskeletal: Normal range of motion. He exhibits no edema or tenderness.       Right shoulder: Normal.       Left shoulder: Normal.       Cervical back: Normal.       Thoracic back: Normal.       Back:  Neurological: He is alert and oriented to person, place, and time.  Skin: Skin is warm and dry. No rash noted. No erythema.  Vitals reviewed.   Lab Results  Component Value Date   WBC 5.8 01/19/2017   HGB 14.0 01/19/2017   HCT 43.3 01/19/2017   PLT 201 01/19/2017   GLUCOSE 96 08/04/2017   CHOL 123 08/04/2017   TRIG 53 08/04/2017   HDL 47 08/04/2017   LDLCALC 65 08/04/2017   ALT 6  08/04/2017   AST 16 08/04/2017   NA 144 08/04/2017   K 4.8 08/04/2017   CL 105 08/04/2017   CREATININE 1.08 08/04/2017   BUN 13 08/04/2017   CO2 27 08/04/2017   TSH 1.08 11/12/2014   PSA 0.42 11/17/2016   HGBA1C 6.4 08/04/2017    Ct Coronary Morph W/cta Cor W/score W/ca W/cm &/or Wo/cm  Addendum Date: 06/23/2017   ADDENDUM REPORT: 06/23/2017 08:15 CLINICAL DATA:  72 year old male with chest pain. EXAM: Cardiac/Coronary  CT TECHNIQUE: The patient was scanned on a [employer]. FINDINGS: A 120 kV prospective scan was triggered in the descending thoracic aorta at 111 HU's. Axial non-contrast 3 mm slices were carried out through the heart. The data set was analyzed on a dedicated work station and scored using the [place]. Gantry rotation speed was  250 msecs and collimation was .6 mm. No beta blockade and 0.8 mg of sl NTG was given. The 3D data set was reconstructed in 5% intervals of the 67-82 % of the R-R cycle. Diastolic phases were analyzed on a dedicated work station using MPR, MIP and VRT modes. The patient received 80 cc of contrast. Aorta: Normal size. Minimal calcifications and atherosclerotic plaque. No dissection. Aortic Valve: Trileaflet. No calcifications. Dilated aortic root at the sinus level with maximum diameter 44 mm. Coronary Arteries:  Normal coronary origin.  Right dominance. RCA is a large dominant artery that gives rise to PDA and PLVB. There is mild calcified plaque in the mid RCA with associated stenosis 25-50%. Left main is a large and long vessel that has mild eccentric calcified plaque in the distal portion with associated stenosis 0-25%. LM gives rise to LAD and LCX arteries. LAD is a large vessel that has mild diffuse predominantly calcified plaque in the proximal to mid portion with maximum stenosis 25-50%. LAD gives rise to two fairly small diagonal arteries. D1 has minimal plaque. D2 has minimal plaque. LCX is a non-dominant artery that gives rise to one OM1 branch. There is minimal non-calcified plaque. Other findings: Normal pulmonary vein drainage into the left atrium. Normal let atrial appendage without a thrombus. Normal size of the pulmonary artery. IMPRESSION: 1. Coronary calcium score of 151. This was 51 percentile for age and sex matched control. 2. Normal coronary origin with right dominance. 3. Mild CAD in the distal left main, proximal to mid LAD and mid RCA. Aggressive risk factor modification is recommended. 4. Dilated aortic root at the sinus level with maximum diameter 44 mm. Electronically Signed   By: Ena Dawley   On: 06/23/2017 08:15   Result Date: 06/23/2017 EXAM: OVER-READ INTERPRETATION  CT CHEST The following report is an over-read performed by radiologist Dr. Vinnie Langton of [facility], [place] on 06/22/2017. This over-read does not include interpretation of cardiac or coronary anatomy or pathology. The coronary calcium score/coronary CTA interpretation by the cardiologist is attached. COMPARISON:  None. FINDINGS: Aortic atherosclerosis. Within the visualized portions of the thorax there are no suspicious appearing pulmonary nodules or masses, there is no acute consolidative airspace disease, no pleural effusions, no pneumothorax and no lymphadenopathy. Visualized portions of the upper abdomen are unremarkable. There are no aggressive appearing lytic or blastic lesions noted in the visualized portions of the skeleton. IMPRESSION: 1.  Aortic Atherosclerosis (ICD10-I70.0). Electronically Signed: By: Vinnie Langton M.D. On: 06/22/2017 09:46    Assessment & Plan:   Norris was seen today for back pain.  Diagnoses and all orders for this visit:  Acute  bilateral thoracic back pain -     Diclofenac Sodium (PENNSAID) 2 % SOLN; Place 1 application onto the skin 2 (two) times daily.  Lipoma of back   I am having [name] on Diclofenac Sodium. I am also having him maintain his divalproex, gabapentin, atorvastatin, metoprolol succinate, metFORMIN, fluticasone, and cetirizine.  Meds ordered this encounter  Medications  . Diclofenac Sodium (PENNSAID) 2 % SOLN    Sig: Place 1 application onto the skin 2 (two) times daily.    Dispense:  2 g    Refill:  0    Order Specific Question:   Supervising Provider    Answer:   Lucille Passy [[id number]]    Follow-up: Return if symptoms worsen or fail to improve.  [name], NP

## 2017-09-07 NOTE — Patient Instructions (Addendum)
Alternate between warm and cold compress as needed. Do exercise once a day.  Let me know if you want referral to surgeon for removal of lipoma (fatty cyst on upper back).  Scapular Winging Rehab Ask your health care provider which exercises are safe for you. Do exercises exactly as told by your health care provider and adjust them as directed. It is normal to feel mild stretching, pulling, tightness, or discomfort as you do these exercises, but you should stop right away if you feel sudden pain or your pain gets worse.Do not begin these exercises until told by your health care provider. Stretching and range of motion exercises These exercises warm up your muscles and joints and improve the movement and flexibility of your shoulder. These exercises also help to relieve pain, numbness, and tingling. Exercise A: Pendulum  1. Stand near a wall or a surface that you can hold onto for balance. 2. Bend at the waist and let your left / right arm hang straight down. Use your other arm to support you. 3. Relax your arm and shoulder muscles, and move your hips and your trunk so your left / right arm swings freely. Your arm should swing because of the motion of your body, not because you are using your arm or shoulder muscles. 4. Keep moving so your arm swings in the following directions, as told by your health care provider: ? Side to side. ? Forward and backward. ? In clockwise and counterclockwise circles. 5. Slowly return to the starting position. Repeat __________ times. Complete this exercise __________ times a day. Exercise B: Flexion, seated  1. Sit in a stable chair so your left / right forearm can rest on a flat surface. Your elbow should rest at a height that keeps your upper arm next to your body. 2. Keeping your shoulder relaxed, lean forward at the waist and let your hand slide forward. Stop when you feel a stretch in your shoulder, or when you reach the angle that is recommended by your  health care provider. 3. Hold for __________ seconds. 4. Slowly return to the starting position. Repeat __________ times. Complete this exercise __________ times a day. Exercise C: Flexion, standing  1. Stand and hold a broomstick, a cane, or a similar object with your hands a little more than shoulder-width apart on the object. Your left / right hand should be palm-up, and your other hand should be palm-down. 2. Use both hands to raise the stick in front of your body and then over your head. Stop when you feel a stretch in your shoulder, or when you reach the angle that is recommended by your health care provider. ? Keep your left / right elbow straight and keep your shoulder muscles relaxed. ? Avoid shrugging your shoulder while you raise your arm. Keep your shoulder blade tucked down toward the middle of your spine. 3. Hold for __________ seconds. 4. Slowly return to the starting position. Repeat __________ times. Complete this exercise __________ times a day. Exercise D: Abduction, supine  1. Lie on your back and hold a broomstick, a cane, or a similar object. Place your hands a little more than shoulder-width apart on the object. Your left / right hand should be palm-up, and your other hand should be palm-down. 2. Push the stick to raise your left / right arm out to your side and then over your head. Use your other hand to help move the stick. Stop when you feel a stretch in your shoulder,  or when you reach the angle that is recommended by your health care provider. ? Avoid shrugging your shoulder while you raise your arm. Keep your shoulder blade tucked down toward the middle of your spine. 3. Hold for __________ seconds. 4. Slowly return to the starting position. Repeat __________ times. Complete this exercise __________ times a day. Exercise E: Flexion, active-assisted  1. Lie on your back. You may bend your knees for comfort. 2. Hold a broomstick, a cane, or a similar object with  your hands about shoulder-width apart on the object. Your palms should face toward your feet. 3. Use both hands to raise the stick toward the ceiling. Continue by moving your arms in front of your face, then behind your head, toward the floor. Use your uninjured arm to help your left / right arm move farther. Stop when you feel a gentle stretch in your shoulder, or when you reach the angle where your health care provider tells you to stop. 4. Hold for __________ seconds. 5. Slowly return to the starting position. Repeat __________ times. Complete this exercise __________ times a day. Strengthening exercises These exercises build strength and endurance in your shoulder. Endurance is the ability to use your muscles for a long time, even after they get tired. Exercise F: Scapular depression and adduction 1. Sit on a stable chair. Support your arms in front of you with pillows, armrests, or a tabletop. Keep your elbows near the sides of your body. 2. Gently move your shoulder blades down and back toward your spine. Relax the muscles on the tops of your shoulders and in the back of your neck. 3. Hold for __________ seconds. 4. Slowly release the tension, and relax your muscles completely before you repeat the exercise. Repeat __________ times. Complete this exercise __________ times a day. After you have practiced this exercise, try doing it without the arm support. Then, try doing it while standing instead of sitting. Exercise G: Scapular protraction, standing  1. Stand so you are facing a wall, about one arm-length away from the wall. 2. Place your hands on the wall and straighten your elbows. 3. Move your shoulder blades down, toward the middle of your spine. 4. Keep your shoulder blades down and move them forward, toward the wall. You should feel your shoulder blades sliding forward, around your ribcage. ? If you are not sure that you are doing this exercise correctly, ask your health care provider  for more instructions. 5. Hold for __________ seconds. 6. Slowly return to the starting position. Let your muscles relax completely before you repeat this exercise. Repeat __________ times. Complete this exercise __________ times a day. Exercise H: Scapular protraction, supine  1. Lie on your back on a firm surface. Hold a __________ weight in your left / right hand. 2. Raise your left / right arm straight into the air so your hand is directly above your shoulder joint. 3. Push the weight into the air so your shoulder blade lifts off of the surface that you are lying on. Do not move your head, neck, or back. 4. Hold for __________ seconds. 5. Slowly return to the starting position. Let your muscles relax completely before you repeat this exercise. Repeat __________ times. Complete this exercise __________ times a day. Exercise I: Scapular protraction, quadruped  1. Get on your hands and knees. Your hands should be directly below your shoulder blades. 2. Straighten your arms until your elbows are locked. 3. Round your back as much as you can.  Think about lifting your rib cage up into your shoulder blades. Keep your neck muscles relaxed. 4. Hold for __________ seconds. 5. Slowly return to the starting position. Let your muscles relax completely before you repeat this exercise. Repeat __________ times. Complete this exercise __________ times a day. Exercise J: Scapular depression  1. Sit in a stable chair that has armrests. Sit upright, with your feet flat on the floor. 2. Put your hands on the armrests with your elbows bent and your fingers pointing forward. Your hands should be about even with the sides of your body. 3. Push down on the armrests to lift yourself off of the chair. Straighten your elbows and lift yourself up as much as you comfortably can. ? Move your shoulder blades down and back. ? Do not let your shoulders move up toward your ears. ? Keep your feet on the ground. As you  get stronger, your feet should support less of your body weight as you do this exercise. 4. Hold for __________ seconds. 5. Slowly lower yourself back into the chair. Repeat __________ times. Complete this exercise __________ times a day. Exercise K: Shoulder extension, prone  1. Lie on your abdomen on a firm surface so your left / right arm hangs over the edge. 2. Hold a __________ weight in your left / right hand so your palm faces in toward your body. Your arm should be straight. 3. Squeeze your shoulder blade down toward the middle of your back. 4. Slowly raise your arm behind you and toward the ceiling, up to the height of the surface that you are lying on. Keep your arm straight. 5. Hold for __________ seconds. 6. Slowly return to the starting position and relax your muscles. Repeat __________ times. Complete this exercise __________ times a day. Exercise L: Horizontal abduction, prone 1. Lie on your abdomen on a firm surface so your left / right arm hangs over the edge. 2. Hold a __________ weight in your hand so your palm faces toward your feet. Your arm should be straight. 3. Squeeze your shoulder blade down toward the middle of your back. 4. Bend your elbow so your hand moves up, until your elbow is bent to an "L" shape (90 degree angle). With your elbow bent, slowly move your forearm forward and up. Raise your hand up to the height of the surface that you are lying on. ? Your upper arm should not move, and your elbow should stay bent. ? At the top of the movement, your palm should face the floor. 5. Hold for __________ seconds. 6. Slowly return to the starting position and relax your muscles. Repeat __________ times. Complete this exercise __________ times a day. Exercise M: Scapular retraction  1. Sit in a stable chair without armrests, or stand. 2. Secure an exercise band to a stable object in front of you so it is at shoulder height. 3. Hold one end of the exercise band in  each hand. Your palms should face down. 4. Straighten your elbows and lift your arms up to shoulder height. 5. Step back, away from the secured end of the exercise band, until the band stretches. 6. Squeeze your shoulder blades together and move your elbows back behind you. Do not shrug your shoulders while you do this. ? Your elbows should stay at about chest or shoulder height. ? Keep your upper arms lifted, away from your sides. 7. Hold for __________ seconds. 8. Slowly return to the starting position. Repeat __________ times. Complete  this exercise __________ times a day. Exercise N: Shoulder extension  1. Sit in a stable chair without armrests, or stand. 2. Secure an exercise band to a stable object in front of you so it is at shoulder height. 3. Hold one end of the exercise band in each hand. Your palms should face each other. 4. Straighten your elbows and lift your hands up to shoulder height. 5. Step back, away from the secured end of the exercise band, until the band stretches. 6. Squeeze your shoulder blades together and pull your hands down to the sides of your thighs. Stop when your hands are straight down by your sides. Do not let your hands go behind your body. 7. Hold for __________ seconds. 8. Slowly return to the starting position. Repeat __________ times. Complete this exercise __________ times a day. Exercise O: Scapular retraction and external rotation  1. Sit in a stable chair without armrests, or stand. 2. Secure an exercise band to a stable object in front of you so it is at shoulder height. 3. Hold one end of the exercise band in each hand. Your palms should face down. 4. Straighten your elbows and lift your hands up to shoulder height. 5. Step back, away from the secured end of the exercise band, until the band stretches. 6. Bend your elbows and raise your hands up to the height of your head. ? Your palms should face out, in front of you, at the top of the  movement. ? Squeeze your shoulder blades together during this movement. 7. Hold for __________ seconds. 8. Slowly straighten your arms to return to the starting position. Repeat __________ times. Complete this exercise __________ times a day. This information is not intended to replace advice given to you by your health care provider. Make sure you discuss any questions you have with your health care provider. Document Released: 05/10/2005 Document Revised: 01/15/2016 Document Reviewed: 04/04/2015 Elsevier Interactive Patient Education  [employer].

## 2017-09-22 ENCOUNTER — Other Ambulatory Visit: Payer: Self-pay | Admitting: Nurse Practitioner

## 2017-09-22 DIAGNOSIS — H6983 Other specified disorders of Eustachian tube, bilateral: Secondary | ICD-10-CM

## 2017-09-22 DIAGNOSIS — H6993 Unspecified Eustachian tube disorder, bilateral: Secondary | ICD-10-CM

## 2017-10-03 DIAGNOSIS — Z961 Presence of intraocular lens: Secondary | ICD-10-CM | POA: Diagnosis not present

## 2017-10-03 DIAGNOSIS — H35363 Drusen (degenerative) of macula, bilateral: Secondary | ICD-10-CM | POA: Diagnosis not present

## 2017-10-03 DIAGNOSIS — H35033 Hypertensive retinopathy, bilateral: Secondary | ICD-10-CM | POA: Diagnosis not present

## 2017-10-03 LAB — HM DIABETES EYE EXAM

## 2017-10-15 ENCOUNTER — Other Ambulatory Visit: Payer: Self-pay

## 2017-10-15 ENCOUNTER — Encounter (HOSPITAL_COMMUNITY): Payer: Self-pay | Admitting: Emergency Medicine

## 2017-10-15 ENCOUNTER — Emergency Department (HOSPITAL_COMMUNITY): Payer: PPO

## 2017-10-15 ENCOUNTER — Emergency Department (HOSPITAL_COMMUNITY)
Admission: EM | Admit: 2017-10-15 | Discharge: 2017-10-16 | Disposition: A | Discharge status: Home / Self Care | Payer: PPO | Attending: Physician Assistant | Admitting: Physician Assistant

## 2017-10-15 DIAGNOSIS — R1013 Epigastric pain: Secondary | ICD-10-CM | POA: Insufficient documentation

## 2017-10-15 DIAGNOSIS — I2510 Atherosclerotic heart disease of native coronary artery without angina pectoris: Secondary | ICD-10-CM | POA: Diagnosis not present

## 2017-10-15 DIAGNOSIS — Z7984 Long term (current) use of oral hypoglycemic drugs: Secondary | ICD-10-CM | POA: Insufficient documentation

## 2017-10-15 DIAGNOSIS — Z87891 Personal history of nicotine dependence: Secondary | ICD-10-CM | POA: Insufficient documentation

## 2017-10-15 DIAGNOSIS — R079 Chest pain, unspecified: Secondary | ICD-10-CM | POA: Diagnosis not present

## 2017-10-15 DIAGNOSIS — Z79899 Other long term (current) drug therapy: Secondary | ICD-10-CM | POA: Insufficient documentation

## 2017-10-15 LAB — BASIC METABOLIC PANEL
Anion gap: 10 (ref 5–15)
BUN: 19 mg/dL (ref 6–20)
CO2: 27 mmol/L (ref 22–32)
Calcium: 9.3 mg/dL (ref 8.9–10.3)
Chloride: 104 mmol/L (ref 101–111)
Creatinine, Ser: 1.3 mg/dL — ABNORMAL HIGH (ref 0.61–1.24)
GFR calc Af Amer: >60 mL/min (ref >60)
GFR calc non Af Amer: 54 mL/min — ABNORMAL LOW (ref >60)
Glucose, Bld: 95 mg/dL (ref 65–99)
Potassium: 3.8 mmol/L (ref 3.5–5.1)
Sodium: 141 mmol/L (ref 135–145)

## 2017-10-15 LAB — I-STAT TROPONIN, ED: Troponin i, poc: 0 ng/mL (ref 0.00–0.08)

## 2017-10-15 LAB — CBC
HCT: 38.8 % — ABNORMAL LOW (ref 39.0–52.0)
Hemoglobin: 12.4 g/dL — ABNORMAL LOW (ref 13.0–17.0)
MCH: 26.6 pg (ref 26.0–34.0)
MCHC: 32 g/dL (ref 30.0–36.0)
MCV: 83.3 fL (ref 78.0–100.0)
Platelets: 202 10*3/uL (ref 150–400)
RBC: 4.66 MIL/uL (ref 4.22–5.81)
RDW: 14.2 % (ref 11.5–15.5)
WBC: 6.9 10*3/uL (ref 4.0–10.5)

## 2017-10-15 LAB — HEPATIC FUNCTION PANEL
ALT: 11 U/L — ABNORMAL LOW (ref 17–63)
AST: 22 U/L (ref 15–41)
Albumin: 4.3 g/dL (ref 3.5–5.0)
Alkaline Phosphatase: 58 U/L (ref 38–126)
Bilirubin, Direct: 0.1 mg/dL (ref 0.1–0.5)
Indirect Bilirubin: 0.4 mg/dL (ref 0.3–0.9)
Total Bilirubin: 0.5 mg/dL (ref 0.3–1.2)
Total Protein: 8.1 g/dL (ref 6.5–8.1)

## 2017-10-15 LAB — LIPASE, BLOOD: Lipase: 30 U/L (ref 11–51)

## 2017-10-15 MED ORDER — GI COCKTAIL ~~LOC~~
30.0000 mL | Freq: Once | ORAL | Status: AC
  Administered 2017-10-15: 30 mL via ORAL
  Filled 2017-10-15: qty 30

## 2017-10-15 MED ORDER — FAMOTIDINE 20 MG PO TABS
20.0000 mg | ORAL_TABLET | Freq: Once | ORAL | Status: AC
  Administered 2017-10-15: 20 mg via ORAL
  Filled 2017-10-15: qty 1

## 2017-10-15 NOTE — ED Provider Notes (Signed)
[place] DEPT Provider Note   CSN: [id number] Arrival date & time: 10/15/17  2134     History   Chief Complaint Chief Complaint  Patient presents with  . Chest Pain    HPI Nicholas Rasmussen is a 72 y.o. male w/ h/o GERD, H. Pylori, CAD, aortic aneurysm , vertigo here for evaluation of chest pain. He points to epigastrium. Onset was sudden while walking around at grocery store.  Preceded by increased belching.  Pain described as sharp, "shooting", intermittent, initially 8/10 but now less.  Associated symptoms include increased burping and feeling hot. He had barbeque for dinner last night and tonight before onset of symptoms.  Acid reflux usually in the same area but feels more like pressure, also with belching.   Denies fevers, cough, nausea, vomiting, SOB, palpitations, back pain.   HPI  Past Medical History:  Diagnosis Date  . Allergic rhinitis 05/07/2014  . Dizziness 01/02/2016  . GERD (gastroesophageal reflux disease)   . Headache 03/13/2015  . Left flank pain 01/02/2016  . Low back pain 06/06/2015  . Pain of left side of body 08/13/2015  . Post herpetic neuralgia   . Routine general medical examination at a health care facility 05/07/2014  . Seizure disorder ([place]) 05/07/2014  . Seizures ([place])    Last seizure in the 70s.  . Shingles   . Stye 05/07/2014    Patient Active Problem List   Diagnosis Date Noted  . Acute pain of left knee 08/04/2017  . CAD (coronary artery disease) 08/03/2017  . Joint pain 08/03/2017  . Post herpetic neuralgia 07/29/2016  . Left flank pain 01/02/2016  . Dizziness 01/02/2016  . Chronic midline low back pain without sciatica 06/06/2015  . Headache 03/13/2015  . GERD (gastroesophageal reflux disease) 11/15/2014  . Seizure disorder ([place]) 05/07/2014  . Allergic rhinitis 05/07/2014  . Routine general medical examination at a health care facility 05/07/2014    Past Surgical History:  Procedure Laterality Date   . KNEE SURGERY Left    left miniscus  . uvala surgery          Home Medications    Prior to Admission medications   Medication Sig Start Date End Date Taking? Authorizing Provider  atorvastatin (LIPITOR) 20 MG tablet Take 1 tablet (20 mg total) by mouth daily. 06/27/17 06/22/18 Yes Belva Crome, MD  Diclofenac Sodium (PENNSAID) 2 % SOLN Place 1 application onto the skin 2 (two) times daily. Patient taking differently: Place 1 application onto the skin 2 (two) times daily as needed (back pain).  09/07/17  Yes Nche, Charlene Brooke, NP  divalproex (DEPAKOTE ER) 500 MG 24 hr tablet Take 1 tablet (500 mg total) by mouth at bedtime. 02/24/17  Yes Marcial Pacas, MD  gabapentin (NEURONTIN) 600 MG tablet Take 600 mg by mouth at bedtime as needed (pain).   Yes [provider]  metFORMIN (GLUCOPHAGE-XR) 500 MG 24 hr tablet Take 1 tablet (500 mg total) by mouth daily with breakfast. 08/05/17  Yes Nche, Charlene Brooke, NP  metoprolol succinate (TOPROL-XL) 25 MG 24 hr tablet Take 1 tablet (25 mg total) by mouth daily. 06/27/17  Yes Belva Crome, MD  alum & mag hydroxide-simeth (MAALOX MULTI SYMPTOM MAX ST) 269-485-46 MG/5ML suspension Take 5 mLs by mouth every 6 (six) hours as needed for indigestion. 10/16/17   Kinnie Feil, PA-C  cetirizine (ZYRTEC) 10 MG tablet Take 1 tablet (10 mg total) by mouth daily. Patient not taking: Reported on 10/16/2017  08/31/17   Nche, Charlene Brooke, NP  famotidine (PEPCID) 20 MG tablet Take 1 tablet (20 mg total) by mouth 2 (two) times daily. 10/16/17   Kinnie Feil, PA-C  fluticasone (FLONASE) 50 MCG/ACT nasal spray SPRAY 2 SPRAYS INTO EACH NOSTRIL EVERY DAY Patient not taking: Reported on 10/16/2017 09/22/17   Nche, Charlene Brooke, NP  pantoprazole (PROTONIX) 20 MG tablet Take 1 tablet (20 mg total) by mouth 2 (two) times daily before a meal. 10/16/17   Kinnie Feil, PA-C    Family History Family History  Problem Relation Age of Onset  . Diabetes Father     . Hypertension Father   . Other Mother        natural causes    Social History Social History   Tobacco Use  . Smoking status: Former Research scientist (life sciences)  . Smokeless tobacco: Never Used  Substance Use Topics  . Alcohol use: No    Alcohol/week: 0.0 oz  . Drug use: No     Allergies   Patient has no known allergies.   Review of Systems Review of Systems  Cardiovascular: Positive for chest pain.  Gastrointestinal: Positive for abdominal pain.       Belching   All other systems reviewed and are negative.    Physical Exam Updated Vital Signs BP 121/77   Pulse (!) 52   Temp 98.3 F (36.8 C) (Oral)   Resp 15   Ht 6\' 4"  (1.93 m)   Wt 106.6 kg (235 lb)   SpO2 100%   BMI 28.61 kg/m   Physical Exam  Constitutional: He appears well-developed and well-nourished.  NAD. Non toxic.   HENT:  Head: Normocephalic and atraumatic.  Nose: Nose normal.  Moist mucous membranes.   Eyes: Conjunctivae, EOM and lids are normal.  Neck: Trachea normal and normal range of motion.  Trachea midline. No cervical adenopathy  Cardiovascular: Normal rate, regular rhythm, S1 normal, S2 normal and normal heart sounds.  Pulses:      Carotid pulses are 2+ on the right side, and 2+ on the left side.      Radial pulses are 2+ on the right side, and 2+ on the left side.       Dorsalis pedis pulses are 2+ on the right side, and 2+ on the left side.  RRR. No LE edema or calf tenderness.   Pulmonary/Chest: Effort normal and breath sounds normal.  No reproducible chest wall tenderness.  Abdominal: Soft. Bowel sounds are normal. There is tenderness.  Mild RUQ and epigastric tenderness. Negative Murphy's and McBurney's. No distention. Active BS. No suprapubic or CVA tenderness.   Neurological: He is alert. GCS eye subscore is 4. GCS verbal subscore is 5. GCS motor subscore is 6.  Skin: Skin is warm and dry. Capillary refill takes less than 2 seconds.  No rash to chest wall  Psychiatric: He has a normal mood  and affect. His speech is normal and behavior is normal. Judgment and thought content normal. Cognition and memory are normal.     ED Treatments / Results  Labs (all labs ordered are listed, but only abnormal results are displayed) Labs Reviewed  BASIC METABOLIC PANEL - Abnormal; Notable for the following components:      Result Value   Creatinine, Ser 1.30 (*)    GFR calc non Af Amer 54 (*)    All other components within normal limits  CBC - Abnormal; Notable for the following components:   Hemoglobin 12.4 (*)  HCT 38.8 (*)    All other components within normal limits  HEPATIC FUNCTION PANEL - Abnormal; Notable for the following components:   ALT 11 (*)    All other components within normal limits  LIPASE, BLOOD  I-STAT TROPONIN, ED  I-STAT TROPONIN, ED    EKG EKG Interpretation  Date/Time:  Saturday Oct 15 2017 21:41:29 EDT Ventricular Rate:  52 PR Interval:    QRS Duration: 100 QT Interval:  432 QTC Calculation: 402 R Axis:   -35 Text Interpretation:  Sinus rhythm Left axis deviation When compared with ECG of 01/19/2017, No significant change was found Confirmed by Delora Fuel ([id number]) on 10/16/2017 12:27:01 AM   Radiology Dg Chest 2 View  Result Date: 10/15/2017 CLINICAL DATA:  Chest pain EXAM: CHEST - 2 VIEW COMPARISON:  Chest CT 06/22/2017 FINDINGS: The heart size and mediastinal contours are within normal limits. Both lungs are clear. The visualized skeletal structures are unremarkable. IMPRESSION: No active cardiopulmonary disease. Electronically Signed   By: Ulyses Jarred M.D.   On: 10/15/2017 22:26    Procedures Procedures (including critical care time)  Medications Ordered in ED Medications  gi cocktail (Maalox,Lidocaine,Donnatal) (30 mLs Oral Given 10/15/17 2256)  famotidine (PEPCID) tablet 20 mg (20 mg Oral Given 10/15/17 2256)     Initial Impression / Assessment and Plan / ED Course  I have reviewed the triage vital signs and the nursing  notes.  Pertinent labs & imaging results that were available during my care of the patient were reviewed by me and considered in my medical decision making (see chart for details).     72 year old male with "chest pain", however he points to epigastrium.  Associated with increased belching.  Had heavy, greasy meal prior to symptom onset.  History of GERD, treated H. Pylori, mild nonobstructive CAD, diabetes and aortic aneurysm. ddx includes ACS, GERD/PUD also considering GI etiology.  Will initiate cardiac and GI work up, pepcid and GI cocktail given.   Labs, imaging, EKG reviewed and reassuring. Trop 0.00 > 0.00. EKG unchanged. CXR negative. Mild improvement in symptoms after GI cocktail and pepcid.  Final Clinical Impressions(s) / ED Diagnoses   Highest suspicion for GI etiology such as GERD/PUD, cholelithiasis.  LFT and lipase WNL. Cardiac etiology considered but thought to be less likely. He has had recent coronary CTA 05/2017 that showed mild CAD, currently compliant with aggressive preventative med regimen. Patient shared with Dr Roxanne Mins and deemed appropriate for dc with PPI, H2 blocker, maalox and close f/u. Discussed return precautions.  Final diagnoses:  Epigastric discomfort    ED Discharge Orders        Ordered    pantoprazole (PROTONIX) 20 MG tablet  2 times daily before meals     10/16/17 0228    famotidine (PEPCID) 20 MG tablet  2 times daily     10/16/17 0228    alum & mag hydroxide-simeth (MAALOX MULTI SYMPTOM MAX ST) [phone] MG/5ML suspension  Every 6 hours PRN     10/16/17 0228       Kinnie Feil, PA-C 10/16/17 0429    Macarthur Critchley, MD 10/16/17 2210

## 2017-10-15 NOTE — ED Triage Notes (Signed)
Pt reports chest pain that started appx 1 hr ago. Pt reports pain having a sharp epigastric pain with belching.

## 2017-10-16 LAB — I-STAT TROPONIN, ED: Troponin i, poc: 0 ng/mL (ref 0.00–0.08)

## 2017-10-16 MED ORDER — ALUM & MAG HYDROXIDE-SIMETH 400-400-40 MG/5ML PO SUSP: 5.0000 mL | Freq: Four times a day (QID) | ORAL | 0 refills | Status: DC | PRN

## 2017-10-16 MED ORDER — PANTOPRAZOLE SODIUM 20 MG PO TBEC: 20.0000 mg | DELAYED_RELEASE_TABLET | Freq: Two times a day (BID) | ORAL | 0 refills | Status: DC

## 2017-10-16 MED ORDER — FAMOTIDINE 20 MG PO TABS: 20.0000 mg | ORAL_TABLET | Freq: Two times a day (BID) | ORAL | 0 refills | Status: DC

## 2017-10-16 NOTE — ED Notes (Signed)
No respiratory or acute distress noted alert and oriented x 3 call light in reach no reaction to medication noted family at bedside. 

## 2017-10-16 NOTE — ED Provider Notes (Signed)
72 year old male comes in with onset this evening of epigastric pain which is fleeting but recurrent.  He belches when it comes on, which gives temporary relief. Lungs are clear, heart has regular rate and rhythm, abdomen is soft and nontender. ECG is unremarkable, and labs are reassuring. Recent coronary CT scan showed no stenosis greater than 50% (although dilated aortic root was noted). Pain seems to be GI in origin - will send home with Rx for pantoprazole.  Medical screening examination/treatment/procedure(s) were conducted as a shared visit with non-physician practitioner(s) and myself.  I personally evaluated the patient during the encounter.  EKG Interpretation  Date/Time:  Saturday Oct 15 2017 21:41:29 EDT Ventricular Rate:  52 PR Interval:    QRS Duration: 100 QT Interval:  432 QTC Calculation: 402 R Axis:   -35 Text Interpretation:  Sinus rhythm Left axis deviation When compared with ECG of 01/19/2017, No significant change was found Confirmed by Delora Fuel [id number]) on 10/16/2017 12:27:01 AM Also confirmed by Delora Fuel [id number]), editor [address] Janett Billow [phone])  on 10/16/2017 27:61:47 AM     Delora Fuel, MD 02/[birth year] 2250

## 2017-10-16 NOTE — Discharge Instructions (Addendum)
Work-up today was reassuring.  We suspect your symptoms may be from worsening acid reflux.   avoid irritating foods and drinks including greasy, fatty or acidic things.  Take Protonix, Pepcid and antacid suspension as prescribed.  Follow-up with gastroenterology in the next week for reevaluation.  Return to the ER for chest pain or shortness of breath with exertion or activity, cough, fevers, shortness of breath.

## 2017-10-19 ENCOUNTER — Telehealth: Payer: Self-pay | Admitting: Interventional Cardiology

## 2017-10-19 NOTE — Telephone Encounter (Signed)
New Message:      Pt is calling for a er follow up visit but states he only wants to see Tamala Julian. Tamala Julian next available appt is in September.

## 2017-10-19 NOTE — Telephone Encounter (Signed)
Reviewed ER visit.  ER MD feels sx are GI related.  Advised pt to contact GI or PCP for f/u on sx.  Pt verbalized understanding and was appreciative for call.

## 2017-10-21 ENCOUNTER — Ambulatory Visit: Payer: PPO | Admitting: Internal Medicine

## 2017-10-21 ENCOUNTER — Encounter: Payer: Self-pay | Admitting: Internal Medicine

## 2017-10-21 VITALS — BP 100/60 | HR 56 | Ht 75.25 in | Wt 231.2 lb

## 2017-10-21 DIAGNOSIS — R1013 Epigastric pain: Secondary | ICD-10-CM | POA: Diagnosis not present

## 2017-10-21 DIAGNOSIS — R142 Eructation: Secondary | ICD-10-CM | POA: Diagnosis not present

## 2017-10-21 NOTE — Progress Notes (Signed)
Nicholas Rasmussen 72 y.o. 1945/06/03 [id number]  Assessment & Plan:   Encounter Diagnoses  Name Primary?  . Abdominal pain, epigastric Yes  . Belching     Stay on PPI Cause not clear ? GERD, ? Hiatal hernia, ? PUD EGD to evaluate The risks and benefits as well as alternatives of endoscopic procedure(s) have been discussed and reviewed. All questions answered. The patient agrees to proceed.  I appreciate the opportunity to care for you. TM:LYYT, Charlene Brooke, NP    Subjective:   Chief Complaint: epigastric pain, belching  HPI  72 yo bm seen in ED ythis month, negative EKG and enzymes for epigastric pain - sharp and intense and lasted x hrs. Was seen here 04/2017 w/ similar sxs that had gotten better on PPI. H pylori Ag neg - and was improved on a PPI - seems like he stopped it. ED started pantoprazole 20 mg bid x 2 weeks. No severe pain since. Belches a lot and does so during interview.  Reduced sweets   Wt Readings from Last 3 Encounters:  10/21/17 231 lb 4 oz (104.9 kg)  10/15/17 235 lb (106.6 kg)  09/07/17 238 lb 3.2 oz (108 kg)   248# in 04/2018 No Known Allergies Current Meds  Medication Sig  . alum & mag hydroxide-simeth (MAALOX MULTI SYMPTOM MAX ST) 035-465-68 MG/5ML suspension Take 5 mLs by mouth every 6 (six) hours as needed for indigestion.  Marland Kitchen atorvastatin (LIPITOR) 20 MG tablet Take 1 tablet (20 mg total) by mouth daily.  . divalproex (DEPAKOTE ER) 500 MG 24 hr tablet Take 1 tablet (500 mg total) by mouth at bedtime.  . famotidine (PEPCID) 20 MG tablet Take 1 tablet (20 mg total) by mouth 2 (two) times daily.  Marland Kitchen gabapentin (NEURONTIN) 600 MG tablet Take 600 mg by mouth at bedtime as needed (pain).  . metFORMIN (GLUCOPHAGE-XR) 500 MG 24 hr tablet Take 1 tablet (500 mg total) by mouth daily with breakfast.  . metoprolol succinate (TOPROL-XL) 25 MG 24 hr tablet Take 1 tablet (25 mg total) by mouth daily.  . pantoprazole (PROTONIX) 20 MG tablet Take 1 tablet  (20 mg total) by mouth 2 (two) times daily before a meal.   Past Medical History:  Diagnosis Date  . Allergic rhinitis 05/07/2014  . Dizziness 01/02/2016  . GERD (gastroesophageal reflux disease)   . Headache 03/13/2015  . Left flank pain 01/02/2016  . Low back pain 06/06/2015  . Pain of left side of body 08/13/2015  . Post herpetic neuralgia   . Routine general medical examination at a health care facility 05/07/2014  . Seizure disorder ([place]) 05/07/2014  . Seizures ([place])    Last seizure in the 70s.  . Shingles   . Stye 05/07/2014   Past Surgical History:  Procedure Laterality Date  . KNEE SURGERY Left    left miniscus  . uvala surgery     Social History   Social History Narrative   Lives alone.   Right-handed.   Rarely drinks caffeine.   family history includes Diabetes in his father; Hypertension in his father; Other in his mother.   Review of Systems As per HPI all other ROS negative  Objective:   Physical Exam BP 100/60 (BP Location: Left Arm, Patient Position: Sitting, Cuff Size: Normal)   Pulse (!) 56   Ht 6' 3.25" (1.911 m) Comment: height measured without shoes  Wt 231 lb 4 oz (104.9 kg)   BMI 28.71 kg/m   Eyes aniceteric Lungs  CTA Cor S1S2 no rmag abd soft and NT - no rib tenderness Appropriate mood/affect Alert and oriented x 3

## 2017-10-21 NOTE — Patient Instructions (Addendum)
  You have been scheduled for an endoscopy. Please follow written instructions given to you at your visit today. If you use inhalers (even only as needed), please bring them with you on the day of your procedure. Your physician has requested that you go to www.startemmi.com and enter the access code given to you at your visit today. This web site gives a general overview about your procedure. However, you should still follow specific instructions given to you by our office regarding your preparation for the procedure.   I appreciate the opportunity to care for you. Carl Gessner, MD, FACG 

## 2017-10-26 ENCOUNTER — Encounter: Payer: Self-pay | Admitting: Internal Medicine

## 2017-10-26 ENCOUNTER — Ambulatory Visit (AMBULATORY_SURGERY_CENTER): Payer: PPO | Admitting: Internal Medicine

## 2017-10-26 ENCOUNTER — Other Ambulatory Visit: Payer: Self-pay

## 2017-10-26 VITALS — BP 117/71 | HR 50 | Temp 96.9°F | Resp 10 | Ht 75.0 in | Wt 231.0 lb

## 2017-10-26 DIAGNOSIS — K2980 Duodenitis without bleeding: Secondary | ICD-10-CM | POA: Diagnosis not present

## 2017-10-26 DIAGNOSIS — I2510 Atherosclerotic heart disease of native coronary artery without angina pectoris: Secondary | ICD-10-CM | POA: Diagnosis not present

## 2017-10-26 DIAGNOSIS — K2950 Unspecified chronic gastritis without bleeding: Secondary | ICD-10-CM | POA: Diagnosis not present

## 2017-10-26 DIAGNOSIS — R1013 Epigastric pain: Secondary | ICD-10-CM

## 2017-10-26 DIAGNOSIS — E119 Type 2 diabetes mellitus without complications: Secondary | ICD-10-CM | POA: Diagnosis not present

## 2017-10-26 DIAGNOSIS — K319 Disease of stomach and duodenum, unspecified: Secondary | ICD-10-CM

## 2017-10-26 DIAGNOSIS — R109 Unspecified abdominal pain: Secondary | ICD-10-CM | POA: Diagnosis not present

## 2017-10-26 MED ORDER — SODIUM CHLORIDE 0.9 % IV SOLN: 500.0000 mL | Freq: Once | INTRAVENOUS | Status: DC

## 2017-10-26 NOTE — Progress Notes (Signed)
Report given to PACU, vss 

## 2017-10-26 NOTE — Progress Notes (Signed)
Called to room to assist during endoscopic procedure.  Patient ID and intended procedure confirmed with present staff. Received instructions for my participation in the procedure from the performing physician.  

## 2017-10-26 NOTE — Patient Instructions (Addendum)
   I did not see anything terrible here - ? Some abnormal stomach and intestine lining.  I took biopsies and will let you know results and plans.  I appreciate the opportunity to care for you. Gatha Mayer, MD, FACG  YOU HAD AN ENDOSCOPIC PROCEDURE TODAY AT [facility]:   Refer to the procedure report that was given to you for any specific questions about what was found during the examination.  If the procedure report does not answer your questions, please call your gastroenterologist to clarify.  If you requested that your care partner not be given the details of your procedure findings, then the procedure report has been included in a sealed envelope for you to review at your convenience later.  YOU SHOULD EXPECT: Some feelings of bloating in the abdomen. Passage of more gas than usual.  Walking can help get rid of the air that was put into your GI tract during the procedure and reduce the bloating. If you had a lower endoscopy (such as a colonoscopy or flexible sigmoidoscopy) you may notice spotting of blood in your stool or on the toilet paper. If you underwent a bowel prep for your procedure, you may not have a normal bowel movement for a few days.  Please Note:  You might notice some irritation and congestion in your nose or some drainage.  This is from the oxygen used during your procedure.  There is no need for concern and it should clear up in a day or so.  SYMPTOMS TO REPORT IMMEDIATELY:    Following upper endoscopy (EGD)  Vomiting of blood or coffee ground material  New chest pain or pain under the shoulder blades  Painful or persistently difficult swallowing  New shortness of breath  Fever of 100F or higher  Black, tarry-looking stools  For urgent or emergent issues, a gastroenterologist can be reached at any hour by calling [phone].   DIET:  We do recommend a small meal at first, but then you may proceed to your regular diet.  Drink plenty of  fluids but you should avoid alcoholic beverages for 24 hours.  ACTIVITY:  You should plan to take it easy for the rest of today and you should NOT DRIVE or use heavy machinery until tomorrow (because of the sedation medicines used during the test).    FOLLOW UP: Our staff will call the number listed on your records the next business day following your procedure to check on you and address any questions or concerns that you may have regarding the information given to you following your procedure. If we do not reach you, we will leave a message.  However, if you are feeling well and you are not experiencing any problems, there is no need to return our call.  We will assume that you have returned to your regular daily activities without incident.  If any biopsies were taken you will be contacted by phone or by letter within the next 1-3 weeks.  Please call us at [phone] if you have not heard about the biopsies in 3 weeks.    SIGNATURES/CONFIDENTIALITY: You and/or your care partner have signed paperwork which will be entered into your electronic medical record.  These signatures attest to the fact that that the information above on your After Visit Summary has been reviewed and is understood.  Full responsibility of the confidentiality of this discharge information lies with you and/or your care-partner.

## 2017-10-26 NOTE — Op Note (Signed)
[facility] Patient Name: Nicholas Rasmussen Procedure Date: 10/26/2017 10:12 AM MRN: [id number] Endoscopist: Gatha Mayer , MD Age: 72 Referring MD:  Date of Birth: [birth year] Gender: Male Account #: [id number] Procedure:                Upper GI endoscopy Indications:              Epigastric abdominal pain, Abdominal bloating,                            Eructation Medicines:                Propofol per Anesthesia, Monitored Anesthesia Care Procedure:                Pre-Anesthesia Assessment:                           - Prior to the procedure, a History and Physical                            was performed, and patient medications and                            allergies were reviewed. The patient's tolerance of                            previous anesthesia was also reviewed. The risks                            and benefits of the procedure and the sedation                            options and risks were discussed with the patient.                            All questions were answered, and informed consent                            was obtained. Prior Anticoagulants: The patient has                            taken no previous anticoagulant or antiplatelet                            agents. ASA Grade Assessment: II - A patient with                            mild systemic disease. After reviewing the risks                            and benefits, the patient was deemed in                            satisfactory condition to undergo the procedure.  After obtaining informed consent, the endoscope was                            passed under direct vision. Throughout the                            procedure, the patient's blood pressure, pulse, and                            oxygen saturations were monitored continuously. The                            Endoscope was introduced through the mouth, and                            advanced to the second  part of duodenum. The upper                            GI endoscopy was accomplished without difficulty.                            The patient tolerated the procedure well. Scope In: Scope Out: Findings:                 Patchy mild mucosal changes characterized by                            atrophy, erythema and a decreased vascular pattern                            were found in the gastric antrum. Biopsies were                            taken with a cold forceps for histology.                            Verification of patient identification for the                            specimen was done. Estimated blood loss was minimal.                           Mildly scalloped mucosa was found in the second                            portion of the duodenum. Biopsies were taken with a                            cold forceps for histology. Verification of patient                            identification for the specimen was done. Estimated  blood loss was minimal.                           The exam was otherwise without abnormality. Complications:            No immediate complications. Estimated Blood Loss:     Estimated blood loss was minimal. Impression:               - Atrophic, erythematous and decreased vascular                            pattern mucosa in the antrum. Biopsied.                           - Scalloped mucosa was found in the duodenum, rule                            out celiac disease. Biopsied.                           - The examination was otherwise normal. Recommendation:           - Patient has a contact number available for                            emergencies. The signs and symptoms of potential                            delayed complications were discussed with the                            patient. Return to normal activities tomorrow.                            Written discharge instructions were provided to the                             patient.                           - Resume previous diet.                           - Continue present medications.                           - Await pathology results. Gatha Mayer, MD 10/26/2017 10:35:20 AM This report has been signed electronically.

## 2017-10-26 NOTE — Progress Notes (Signed)
I have reviewed the patient's medical history in detail and updated the computerized patient record.

## 2017-10-27 ENCOUNTER — Telehealth: Payer: Self-pay

## 2017-10-27 NOTE — Telephone Encounter (Signed)
  Follow up Call-  Call back number 10/26/2017  Post procedure Call Back phone  # [phone]  Permission to leave phone message Yes  Some recent data might be hidden     Patient questions:  Do you have a fever, pain , or abdominal swelling? No. Pain Score  0 *  Have you tolerated food without any problems? Yes.    Have you been able to return to your normal activities? Yes.    Do you have any questions about your discharge instructions: Diet   No. Medications  No. Follow up visit  No.  Do you have questions or concerns about your Care? No.  Actions: * If pain score is 4 or above: No action needed, pain <4. Pt verbalize throat was a little sore. Advised to gargle with a warm salt and water.No further questions or concerns.

## 2017-11-02 ENCOUNTER — Other Ambulatory Visit: Payer: Self-pay | Admitting: Internal Medicine

## 2017-11-02 NOTE — Progress Notes (Signed)
[facility] - no letter, no recall  Office  Tell him gastritis and duodenitis - inflammation from acid in stomach and intestine  Treat with Rx of omeprazole capsule 40 mg po daily before breakfast # 90 3 refills  See me Next available

## 2017-11-03 ENCOUNTER — Ambulatory Visit (INDEPENDENT_AMBULATORY_CARE_PROVIDER_SITE_OTHER): Payer: PPO | Admitting: Family Medicine

## 2017-11-03 ENCOUNTER — Encounter: Payer: Self-pay | Admitting: Family Medicine

## 2017-11-03 ENCOUNTER — Other Ambulatory Visit: Payer: Self-pay

## 2017-11-03 VITALS — BP 102/70 | HR 52 | Temp 98.4°F | Ht 75.0 in | Wt 234.4 lb

## 2017-11-03 DIAGNOSIS — J069 Acute upper respiratory infection, unspecified: Secondary | ICD-10-CM

## 2017-11-03 MED ORDER — BENZONATATE 100 MG PO CAPS: 100.0000 mg | ORAL_CAPSULE | Freq: Three times a day (TID) | ORAL | 0 refills | Status: DC | PRN

## 2017-11-03 MED ORDER — OMEPRAZOLE 40 MG PO CPDR: 40.0000 mg | DELAYED_RELEASE_CAPSULE | Freq: Every day | ORAL | 3 refills | Status: DC

## 2017-11-03 MED ORDER — CETIRIZINE HCL 10 MG PO TABS: 10.0000 mg | ORAL_TABLET | Freq: Every day | ORAL | 0 refills | Status: DC

## 2017-11-03 NOTE — Patient Instructions (Signed)

## 2017-11-03 NOTE — Progress Notes (Signed)
Subjective:  Patient ID: Nicholas Rasmussen, male    DOB: [birth year]  Age: 72 y.o. MRN: [id number]  CC: Sore Throat and Nasal Congestion   HPI Peter Keyworth presents for evaluation of a 1 day history of nasal congestion postnasal drip, dry cough and fatigue.  Patient denies fever chills nausea vomiting wheezing, reactive airway disease myalgias arthralgias.  He tried some TheraFlu last night with some relief.  The drainage down the back of his throat bothers him most.  He does not smoke or have an asthma history.  Outpatient Medications Prior to Visit  Medication Sig Dispense Refill  . alum & mag hydroxide-simeth (MAALOX MULTI SYMPTOM MAX ST) 400-400-40 MG/5ML suspension Take 5 mLs by mouth every 6 (six) hours as needed for indigestion. 355 mL 0  . atorvastatin (LIPITOR) 20 MG tablet Take 1 tablet (20 mg total) by mouth daily. 90 tablet 3  . divalproex (DEPAKOTE ER) 500 MG 24 hr tablet Take 1 tablet (500 mg total) by mouth at bedtime. 90 tablet 3  . famotidine (PEPCID) 20 MG tablet Take 1 tablet (20 mg total) by mouth 2 (two) times daily. 30 tablet 0  . gabapentin (NEURONTIN) 600 MG tablet Take 600 mg by mouth at bedtime as needed (pain).    . metFORMIN (GLUCOPHAGE-XR) 500 MG 24 hr tablet Take 1 tablet (500 mg total) by mouth daily with breakfast. 30 tablet 5  . metoprolol succinate (TOPROL-XL) 25 MG 24 hr tablet Take 1 tablet (25 mg total) by mouth daily. 90 tablet 3  . omeprazole (PRILOSEC) 40 MG capsule Take 1 capsule (40 mg total) by mouth daily. 90 capsule 3   Facility-Administered Medications Prior to Visit  Medication Dose Route Frequency Provider Last Rate Last Dose  . 0.9 %  sodium chloride infusion  500 mL Intravenous Once Gatha Mayer, MD        ROS Review of Systems  Constitutional: Positive for fatigue. Negative for chills, diaphoresis, fever and unexpected weight change.  HENT: Positive for congestion, postnasal drip, sore throat and voice change. Negative for rhinorrhea  and trouble swallowing.   Eyes: Negative.   Respiratory: Positive for cough. Negative for chest tightness and wheezing.   Cardiovascular: Negative.   Gastrointestinal: Negative.   Musculoskeletal: Negative for arthralgias.  Allergic/Immunologic: Negative for immunocompromised state.  Neurological: Negative for headaches.  Hematological: Does not bruise/bleed easily.  Psychiatric/Behavioral: Negative.     Objective:  BP 102/70   Pulse (!) 52   Temp 98.4 F (36.9 C)   Ht 6\' 3"  (1.905 m)   Wt 234 lb 6 oz (106.3 kg)   SpO2 97%   BMI 29.29 kg/m   BP Readings from Last 3 Encounters:  11/03/17 102/70  10/26/17 117/71  10/21/17 100/60    Wt Readings from Last 3 Encounters:  11/03/17 234 lb 6 oz (106.3 kg)  10/26/17 231 lb (104.8 kg)  10/21/17 231 lb 4 oz (104.9 kg)    Physical Exam  Constitutional: He is oriented to person, place, and time. He appears well-developed and well-nourished.  Non-toxic appearance. He does not appear ill. No distress.  HENT:  Head: Normocephalic and atraumatic.  Right Ear: Hearing, tympanic membrane and ear canal normal.  Left Ear: Hearing, tympanic membrane and ear canal normal.  Mouth/Throat: Mucous membranes are normal. No oral lesions. No oropharyngeal exudate, posterior oropharyngeal edema or posterior oropharyngeal erythema. No tonsillar exudate.  Eyes: Pupils are equal, round, and reactive to light. EOM are normal.  Neck: No thyromegaly present.  Cardiovascular: Normal rate, regular rhythm and normal heart sounds.  Pulmonary/Chest: Effort normal and breath sounds normal. No stridor. No respiratory distress. He has no wheezes. He has no rhonchi. He has no rales. He exhibits no tenderness.  Lymphadenopathy:    He has no cervical adenopathy.  Neurological: He is alert and oriented to person, place, and time.  Skin: Skin is warm and dry.  Psychiatric: He has a normal mood and affect. His behavior is normal.    Lab Results  Component Value  Date   WBC 6.9 10/15/2017   HGB 12.4 (L) 10/15/2017   HCT 38.8 (L) 10/15/2017   PLT 202 10/15/2017   GLUCOSE 95 10/15/2017   CHOL 123 08/04/2017   TRIG 53 08/04/2017   HDL 47 08/04/2017   LDLCALC 65 08/04/2017   ALT 11 (L) 10/15/2017   AST 22 10/15/2017   NA 141 10/15/2017   K 3.8 10/15/2017   CL 104 10/15/2017   CREATININE 1.30 (H) 10/15/2017   BUN 19 10/15/2017   CO2 27 10/15/2017   TSH 1.08 11/12/2014   PSA 0.42 11/17/2016   HGBA1C 6.4 08/04/2017    Dg Chest 2 View  Result Date: 10/15/2017 CLINICAL DATA:  Chest pain EXAM: CHEST - 2 VIEW COMPARISON:  Chest CT 06/22/2017 FINDINGS: The heart size and mediastinal contours are within normal limits. Both lungs are clear. The visualized skeletal structures are unremarkable. IMPRESSION: No active cardiopulmonary disease. Electronically Signed   By: Ulyses Jarred M.D.   On: 10/15/2017 22:26    Assessment & Plan:   Kaliq was seen today for sore throat and nasal congestion.  Diagnoses and all orders for this visit:  Viral upper respiratory tract infection -     benzonatate (TESSALON) 100 MG capsule; Take 1 capsule (100 mg total) by mouth 3 (three) times daily as needed for cough. -     cetirizine (ZYRTEC) 10 MG tablet; Take 1 tablet (10 mg total) by mouth daily.   I am having [name] on benzonatate and cetirizine. I am also having him maintain his divalproex, gabapentin, atorvastatin, metoprolol succinate, metFORMIN, famotidine, alum & mag hydroxide-simeth, and omeprazole. We will continue to administer sodium chloride.  Meds ordered this encounter  Medications  . benzonatate (TESSALON) 100 MG capsule    Sig: Take 1 capsule (100 mg total) by mouth 3 (three) times daily as needed for cough.    Dispense:  20 capsule    Refill:  0  . cetirizine (ZYRTEC) 10 MG tablet    Sig: Take 1 tablet (10 mg total) by mouth daily.    Dispense:  20 tablet    Refill:  0   Patient will return in 1 week unless his symptoms are  totally resolved..  Anticipatory guidance was given on viral upper respiratory tract infections.  Follow-up: Return in about 1 week (around 11/10/2017).  Libby Maw, MD

## 2017-11-04 ENCOUNTER — Encounter: Payer: PPO | Admitting: Internal Medicine

## 2017-11-15 ENCOUNTER — Ambulatory Visit: Payer: PPO | Admitting: Family Medicine

## 2017-11-17 ENCOUNTER — Ambulatory Visit: Payer: PPO | Admitting: Family Medicine

## 2017-11-23 ENCOUNTER — Encounter: Payer: Self-pay | Admitting: Nurse Practitioner

## 2017-11-23 ENCOUNTER — Ambulatory Visit (INDEPENDENT_AMBULATORY_CARE_PROVIDER_SITE_OTHER): Payer: PPO | Admitting: Nurse Practitioner

## 2017-11-23 VITALS — BP 110/60 | HR 51 | Temp 98.3°F | Ht 75.0 in | Wt 236.0 lb

## 2017-11-23 DIAGNOSIS — G8929 Other chronic pain: Secondary | ICD-10-CM

## 2017-11-23 DIAGNOSIS — R739 Hyperglycemia, unspecified: Secondary | ICD-10-CM | POA: Diagnosis not present

## 2017-11-23 DIAGNOSIS — E119 Type 2 diabetes mellitus without complications: Secondary | ICD-10-CM

## 2017-11-23 DIAGNOSIS — I25119 Atherosclerotic heart disease of native coronary artery with unspecified angina pectoris: Secondary | ICD-10-CM | POA: Diagnosis not present

## 2017-11-23 DIAGNOSIS — R109 Unspecified abdominal pain: Secondary | ICD-10-CM

## 2017-11-23 LAB — BASIC METABOLIC PANEL
BUN: 15 mg/dL (ref 6–23)
CO2: 30 mEq/L (ref 19–32)
Calcium: 9.1 mg/dL (ref 8.4–10.5)
Chloride: 104 mEq/L (ref 96–112)
Creatinine, Ser: 0.95 mg/dL (ref 0.40–1.50)
GFR: 100.89 mL/min (ref >60.00)
Glucose, Bld: 105 mg/dL — ABNORMAL HIGH (ref 70–99)
Potassium: 3.9 mEq/L (ref 3.5–5.1)
Sodium: 140 mEq/L (ref 135–145)

## 2017-11-23 LAB — MICROALBUMIN / CREATININE URINE RATIO
Creatinine,U: 376.6 mg/dL
Microalb Creat Ratio: 0.4 mg/g (ref 0.0–30.0)
Microalb, Ur: 1.4 mg/dL (ref 0.0–1.9)

## 2017-11-23 LAB — HEMOGLOBIN A1C: Hgb A1c MFr Bld: 6.3 % (ref 4.6–6.5)

## 2017-11-23 MED ORDER — METFORMIN HCL ER 500 MG PO TB24: 500.0000 mg | ORAL_TABLET | Freq: Every day | ORAL | 3 refills | Status: DC

## 2017-11-23 MED ORDER — ACETAMINOPHEN 500 MG PO TABS: 500.0000 mg | ORAL_TABLET | Freq: Three times a day (TID) | ORAL | 0 refills | Status: DC | PRN

## 2017-11-23 NOTE — Patient Instructions (Addendum)
Stable lab results. Controlled DM  OK to use tylenol 500mg  1-2tabs at HS for left flank pain. I think you pain is related to musculoskeletal strain due to sleeping position. Based of previous radiology imaging and labs, I do not think there is any abnormality in internal organs.

## 2017-11-23 NOTE — Progress Notes (Signed)
Subjective:  Patient ID: Nicholas Rasmussen, male    DOB: 11/24/1945  Age: 72 y.o. MRN: [id number]  CC: Follow-up (left side pain/still in pain)  HPI  Left flank pain: Chronic, onset 2017, intermittent Worse in morning when he sleeps on left side.  Radiates to left lower back. Improves with repositioning Does not interfere with ADLs. Denies any recent injury.  DM: Stable with metformin. Denies any adverse effects with metformin. Does not check glucose at home. Has not done eye exam.  Reviewed [place] today  Outpatient Medications Prior to Visit  Medication Sig Dispense Refill  . atorvastatin (LIPITOR) 20 MG tablet Take 1 tablet (20 mg total) by mouth daily. 90 tablet 3  . divalproex (DEPAKOTE ER) 500 MG 24 hr tablet Take 1 tablet (500 mg total) by mouth at bedtime. 90 tablet 3  . gabapentin (NEURONTIN) 600 MG tablet Take 600 mg by mouth at bedtime as needed (pain).    . metoprolol succinate (TOPROL-XL) 25 MG 24 hr tablet Take 1 tablet (25 mg total) by mouth daily. 90 tablet 3  . omeprazole (PRILOSEC) 40 MG capsule Take 1 capsule (40 mg total) by mouth daily. 90 capsule 3  . metFORMIN (GLUCOPHAGE-XR) 500 MG 24 hr tablet Take 1 tablet (500 mg total) by mouth daily with breakfast. 30 tablet 5  . alum & mag hydroxide-simeth (MAALOX MULTI SYMPTOM MAX ST) 400-400-40 MG/5ML suspension Take 5 mLs by mouth every 6 (six) hours as needed for indigestion. (Patient not taking: Reported on 11/23/2017) 355 mL 0  . benzonatate (TESSALON) 100 MG capsule Take 1 capsule (100 mg total) by mouth 3 (three) times daily as needed for cough. (Patient not taking: Reported on 11/23/2017) 20 capsule 0  . cetirizine (ZYRTEC) 10 MG tablet Take 1 tablet (10 mg total) by mouth daily. (Patient not taking: Reported on 11/23/2017) 20 tablet 0  . famotidine (PEPCID) 20 MG tablet Take 1 tablet (20 mg total) by mouth 2 (two) times daily. (Patient not taking: Reported on 11/23/2017) 30 tablet 0   Facility-Administered Medications  Prior to Visit  Medication Dose Route Frequency Provider Last Rate Last Dose  . 0.9 %  sodium chloride infusion  500 mL Intravenous Once Gatha Mayer, MD        ROS Review of Systems  Constitutional: Negative.   Respiratory: Negative for shortness of breath.   Cardiovascular: Negative for chest pain and leg swelling.  Gastrointestinal: Negative.   Genitourinary: Negative.   Musculoskeletal: Positive for back pain. Negative for falls.  Skin: Negative.   Neurological: Negative for sensory change and seizures.   Objective:  BP 110/60   Pulse (!) 51   Temp 98.3 F (36.8 C) (Oral)   Ht 6\' 3"  (1.905 m)   Wt 236 lb (107 kg)   SpO2 98%   BMI 29.50 kg/m   BP Readings from Last 3 Encounters:  11/23/17 110/60  11/03/17 102/70  10/26/17 117/71    Wt Readings from Last 3 Encounters:  11/23/17 236 lb (107 kg)  11/03/17 234 lb 6 oz (106.3 kg)  10/26/17 231 lb (104.8 kg)    Physical Exam  Constitutional: He is oriented to person, place, and time. No distress.  Neck: Normal range of motion. Neck supple.  Cardiovascular: Normal rate.  Pulmonary/Chest: Effort normal.  Abdominal: Soft. Bowel sounds are normal. There is no tenderness. There is no guarding.  Musculoskeletal: Normal range of motion. He exhibits no edema or tenderness.  Neurological: He is alert and oriented to person,  place, and time.  Skin: Skin is warm and dry. No rash noted.  Psychiatric: He has a normal mood and affect. His behavior is normal. Thought content normal.  Vitals reviewed.   Lab Results  Component Value Date   WBC 6.9 10/15/2017   HGB 12.4 (L) 10/15/2017   HCT 38.8 (L) 10/15/2017   PLT 202 10/15/2017   GLUCOSE 105 (H) 11/23/2017   CHOL 123 08/04/2017   TRIG 53 08/04/2017   HDL 47 08/04/2017   LDLCALC 65 08/04/2017   ALT 11 (L) 10/15/2017   AST 22 10/15/2017   NA 140 11/23/2017   K 3.9 11/23/2017   CL 104 11/23/2017   CREATININE 0.95 11/23/2017   BUN 15 11/23/2017   CO2 30 11/23/2017     TSH 1.08 11/12/2014   PSA 0.42 11/17/2016   HGBA1C 6.3 11/23/2017   MICROALBUR 1.4 11/23/2017    Dg Chest 2 View  Result Date: 10/15/2017 CLINICAL DATA:  Chest pain EXAM: CHEST - 2 VIEW COMPARISON:  Chest CT 06/22/2017 FINDINGS: The heart size and mediastinal contours are within normal limits. Both lungs are clear. The visualized skeletal structures are unremarkable. IMPRESSION: No active cardiopulmonary disease. Electronically Signed   By: Ulyses Jarred M.D.   On: 10/15/2017 22:26    Assessment & Plan:   Nitin was seen today for follow-up.  Diagnoses and all orders for this visit:  Left flank pain, chronic -     Urinalysis w microscopic + reflex cultur -     acetaminophen (TYLENOL) 500 MG tablet; Take 1 tablet (500 mg total) by mouth every 8 (eight) hours as needed.  Type 2 diabetes mellitus without complication, without long-term current use of insulin (HCC) -     Hemoglobin A1c -     Microalbumin / creatinine urine ratio -     Basic metabolic panel  Hyperglycemia -     metFORMIN (GLUCOPHAGE-XR) 500 MG 24 hr tablet; Take 1 tablet (500 mg total) by mouth daily with breakfast.  Coronary artery disease involving native coronary artery of native heart with angina pectoris (HCC) -     metFORMIN (GLUCOPHAGE-XR) 500 MG 24 hr tablet; Take 1 tablet (500 mg total) by mouth daily with breakfast.   I have discontinued Noland Madrid's famotidine, alum & mag hydroxide-simeth, benzonatate, and cetirizine. I am also having him start on acetaminophen. Additionally, [name] his divalproex, gabapentin, atorvastatin, metoprolol succinate, omeprazole, and metFORMIN. We will continue to administer sodium chloride.  Meds ordered this encounter  Medications  . acetaminophen (TYLENOL) 500 MG tablet    Sig: Take 1 tablet (500 mg total) by mouth every 8 (eight) hours as needed.    Dispense:  30 tablet    Refill:  0    Order Specific Question:   Supervising Provider    Answer:    Lucille Passy [[id number]]  . metFORMIN (GLUCOPHAGE-XR) 500 MG 24 hr tablet    Sig: Take 1 tablet (500 mg total) by mouth daily with breakfast.    Dispense:  90 tablet    Refill:  3    Order Specific Question:   Supervising Provider    Answer:   Lucille Passy [[id number]]    Follow-up: Return in about 6 months (around 05/26/2018) for DM and HTN (fasting).  [name], NP

## 2017-11-24 LAB — NO CULTURE INDICATED

## 2017-11-24 LAB — URINALYSIS W MICROSCOPIC + REFLEX CULTURE
Bacteria, UA: NONE SEEN /HPF
Bilirubin Urine: NEGATIVE
Glucose, UA: NEGATIVE
Hgb urine dipstick: NEGATIVE
Hyaline Cast: NONE SEEN /LPF
Leukocyte Esterase: NEGATIVE
Nitrites, Initial: NEGATIVE
Protein, ur: NEGATIVE
RBC / HPF: NONE SEEN /HPF (ref 0–2)
Specific Gravity, Urine: 1.03 (ref 1.001–1.03)
Squamous Epithelial / LPF: NONE SEEN /HPF (ref <=5)
WBC, UA: NONE SEEN /HPF (ref 0–5)
pH: 5.5 (ref 5.0–8.0)

## 2017-12-29 ENCOUNTER — Ambulatory Visit (INDEPENDENT_AMBULATORY_CARE_PROVIDER_SITE_OTHER): Payer: PPO | Admitting: Nurse Practitioner

## 2017-12-29 ENCOUNTER — Encounter: Payer: Self-pay | Admitting: Nurse Practitioner

## 2017-12-29 VITALS — BP 100/64 | HR 52 | Temp 98.2°F | Ht 75.0 in | Wt 237.0 lb

## 2017-12-29 DIAGNOSIS — E119 Type 2 diabetes mellitus without complications: Secondary | ICD-10-CM

## 2017-12-29 LAB — GLUCOSE, POCT (MANUAL RESULT ENTRY): POC Glucose: 95 mg/dl (ref 70–99)

## 2017-12-29 NOTE — Progress Notes (Signed)
Subjective:  Patient ID: Nicholas Rasmussen, male    DOB: [birth year]  Age: 72 y.o. MRN: [id number]  CC: Medication Problem (pt has questions with Metformin, pt has experience side effects (joint pain and fatigue) pt wants to consider an alternative. had eye exam 09/2017 and has not had foot exam)  HPI Nicholas Rasmussen is here due to concerns about possible side effects of metformin. He noticed joint pain and fatigue in last 93months. Symptoms are stable. Do not interfere with ADLs. He stopped metformin on Monday, symptoms have improved since then. Denies any GI symptoms. He will like to know if using no medication for DM is recommended. He has decreased carbs and sugar in his diet, but has moments when he eats more than recommened (1-2x/week). He continues to stay active (walking and dancing 3x/week).  Atorvastatin, metformin, and metoprolol were added in last 55months.  Reviewed past Medical, Social and Family history today.  Outpatient Medications Prior to Visit  Medication Sig Dispense Refill  . acetaminophen (TYLENOL) 500 MG tablet Take 1 tablet (500 mg total) by mouth every 8 (eight) hours as needed. 30 tablet 0  . atorvastatin (LIPITOR) 20 MG tablet Take 1 tablet (20 mg total) by mouth daily. 90 tablet 3  . divalproex (DEPAKOTE ER) 500 MG 24 hr tablet Take 1 tablet (500 mg total) by mouth at bedtime. 90 tablet 3  . gabapentin (NEURONTIN) 600 MG tablet Take 600 mg by mouth at bedtime as needed (pain).    . metoprolol succinate (TOPROL-XL) 25 MG 24 hr tablet Take 1 tablet (25 mg total) by mouth daily. 90 tablet 3  . metFORMIN (GLUCOPHAGE-XR) 500 MG 24 hr tablet Take 1 tablet (500 mg total) by mouth daily with breakfast. (Patient not taking: Reported on 12/29/2017) 90 tablet 3  . omeprazole (PRILOSEC) 40 MG capsule Take 1 capsule (40 mg total) by mouth daily. (Patient not taking: Reported on 12/29/2017) 90 capsule 3   Facility-Administered Medications Prior to Visit  Medication Dose Route Frequency  Provider Last Rate Last Dose  . 0.9 %  sodium chloride infusion  500 mL Intravenous Once Gatha Mayer, MD        ROS See HPI  Objective:  BP 100/64   Pulse (!) 52   Temp 98.2 F (36.8 C) (Oral)   Ht 6\' 3"  (1.905 m)   Wt 237 lb (107.5 kg)   SpO2 98%   BMI 29.62 kg/m   BP Readings from Last 3 Encounters:  12/29/17 100/64  11/23/17 110/60  11/03/17 102/70    Wt Readings from Last 3 Encounters:  12/29/17 237 lb (107.5 kg)  11/23/17 236 lb (107 kg)  11/03/17 234 lb 6 oz (106.3 kg)    Physical Exam  Constitutional: He is oriented to person, place, and time. He appears well-developed and well-nourished. No distress.  Cardiovascular: Normal rate.  Pulmonary/Chest: Effort normal.  Musculoskeletal: He exhibits no edema or tenderness.  Neurological: He is alert and oriented to person, place, and time.  Skin: No rash noted.  Vitals reviewed.   Lab Results  Component Value Date   WBC 6.9 10/15/2017   HGB 12.4 (L) 10/15/2017   HCT 38.8 (L) 10/15/2017   PLT 202 10/15/2017   GLUCOSE 105 (H) 11/23/2017   CHOL 123 08/04/2017   TRIG 53 08/04/2017   HDL 47 08/04/2017   LDLCALC 65 08/04/2017   ALT 11 (L) 10/15/2017   AST 22 10/15/2017   NA 140 11/23/2017   K 3.9 11/23/2017  CL 104 11/23/2017   CREATININE 0.95 11/23/2017   BUN 15 11/23/2017   CO2 30 11/23/2017   TSH 1.08 11/12/2014   PSA 0.42 11/17/2016   HGBA1C 6.3 11/23/2017   MICROALBUR 1.4 11/23/2017    Dg Chest 2 View  Result Date: 10/15/2017 CLINICAL DATA:  Chest pain EXAM: CHEST - 2 VIEW COMPARISON:  Chest CT 06/22/2017 FINDINGS: The heart size and mediastinal contours are within normal limits. Both lungs are clear. The visualized skeletal structures are unremarkable. IMPRESSION: No active cardiopulmonary disease. Electronically Signed   By: Ulyses Jarred M.D.   On: 10/15/2017 22:26    Assessment & Plan:   I spent 67mins with [name] discussing the cardiovascular effects of metformin vs glipizide. We  also discussed side effects of atorvastatin and metoprolol that were added at same time as metformin. We talked about the need to monitor glucose at home if he decides not to take any DM medication. He decided to resume metformin at this time due to his risk of cardiovascular event.  Nicholas Rasmussen was seen today for medication problem.  Diagnoses and all orders for this visit:  Type 2 diabetes mellitus without complication, without long-term current use of insulin (HCC) -     POCT Glucose (CBG)   I am having Nicholas Rasmussen maintain his divalproex, gabapentin, atorvastatin, metoprolol succinate, omeprazole, acetaminophen, and metFORMIN. We will continue to administer sodium chloride.  No orders of the defined types were placed in this encounter.   Follow-up: Return in about 3 months (around 03/31/2018) for DM and HTN, hyperlipidemia (fasting).  [name], NP

## 2017-12-29 NOTE — Patient Instructions (Signed)
Maintain metformin. Please let me know if you have any other questions.

## 2017-12-30 ENCOUNTER — Encounter: Payer: Self-pay | Admitting: Nurse Practitioner

## 2018-01-05 ENCOUNTER — Other Ambulatory Visit: Payer: Self-pay | Admitting: Neurology

## 2018-01-12 ENCOUNTER — Ambulatory Visit: Payer: PPO | Admitting: Internal Medicine

## 2018-01-12 ENCOUNTER — Encounter: Payer: Self-pay | Admitting: Internal Medicine

## 2018-01-12 VITALS — BP 110/62 | HR 70 | Ht 75.5 in | Wt 235.0 lb

## 2018-01-12 DIAGNOSIS — K2990 Gastroduodenitis, unspecified, without bleeding: Secondary | ICD-10-CM

## 2018-01-12 MED ORDER — OMEPRAZOLE 20 MG PO CPDR: 20.0000 mg | DELAYED_RELEASE_CAPSULE | Freq: Every day | ORAL | 3 refills | Status: DC

## 2018-01-12 NOTE — Progress Notes (Signed)
Nicholas Rasmussen 72 y.o. [birth year] [id number]  Assessment & Plan:   Encounter Diagnosis  Name Primary?  . Gastritis and duodenitis Yes    He will continue PPI we will try dose reduction to 20 mg daily.  He understands that spicy foods might upset his stomach he can try Pepto-Bismol or an antacid as needed.  I think a Mediterranean diet would be healthy for him and would also recommend [place] yogurt with fresh fruit in the morning.  We will send a release of information to Dr. Dwyane Dee to obtain his colonoscopy report.  Further plans pending that he can see me as needed.  I appreciate the opportunity to care for this patient. CC: Nicholas Rasmussen, Nicholas Brooke, NP    Subjective:   Chief Complaint: abdominal pain. Gastritis, duodenitis  HPI [name] is here for follow-up after an EGD demonstrated gastritis and duodenitis which has improved significantly on omeprazole 40 mg daily. Spicy foods still bother him when he eats them. Otherwise ok Going back to cereals w/ lactaid milk for breakfast he thinks or he asked what about yogurt.  He has been eating packaged oatmeal. Stopped whole milk due to belching and he thinks he is lactose intolerant.  He wonders about when he needs a colonoscopy again he recalls being told to repeat one by Dr. Dwyane Dee in [place] when he had one in 2015 though he does not recall having polyps removed.  Those records are not in the chart yet. Wt Readings from Last 3 Encounters:  01/12/18 235 lb (106.6 kg)  12/29/17 237 lb (107.5 kg)  11/23/17 236 lb (107 kg)    No Known Allergies Current Meds  Medication Sig  . acetaminophen (TYLENOL) 500 MG tablet Take 1 tablet (500 mg total) by mouth every 8 (eight) hours as needed.  Marland Kitchen atorvastatin (LIPITOR) 20 MG tablet Take 1 tablet (20 mg total) by mouth daily.  . divalproex (DEPAKOTE ER) 500 MG 24 hr tablet TAKE 1 TABLET (500 MG TOTAL) BY MOUTH AT BEDTIME.  Marland Kitchen gabapentin (NEURONTIN) 600 MG tablet Take 600 mg by mouth at  bedtime as needed (pain).  . metFORMIN (GLUCOPHAGE-XR) 500 MG 24 hr tablet Take 1 tablet (500 mg total) by mouth daily with breakfast.  . metoprolol succinate (TOPROL-XL) 25 MG 24 hr tablet Take 1 tablet (25 mg total) by mouth daily.  Marland Kitchen omeprazole (PRILOSEC) 40 MG capsule Take 1 capsule (40 mg total) by mouth daily.   Current Facility-Administered Medications for the 01/12/18 encounter (Office Visit) with Gatha Mayer, MD  Medication  . 0.9 %  sodium chloride infusion   Past Medical History:  Diagnosis Date  . Allergic rhinitis 05/07/2014  . Dizziness 01/02/2016  . GERD (gastroesophageal reflux disease)   . Headache 03/13/2015  . Left flank pain 01/02/2016  . Low back pain 06/06/2015  . Pain of left side of body 08/13/2015  . Post herpetic neuralgia   . Routine general medical examination at a health care facility 05/07/2014  . Seizure disorder ([place]) 05/07/2014  . Seizures ([place])    Last seizure in the 70s.  . Shingles   . Stye 05/07/2014   Past Surgical History:  Procedure Laterality Date  . COLONOSCOPY    . KNEE SURGERY Left    left miniscus  . UPPER GASTROINTESTINAL ENDOSCOPY    . 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   Right-handed.   Rarely drinks caffeine.   family history includes Diabetes in his father; Hypertension in his father; Other in his mother.   Review of Systems As above  Objective:   Physical Exam BP 110/62   Pulse 70   Ht 6' 3.5" (1.918 m)   Wt 235 lb (106.6 kg)   BMI 28.99 kg/m  He is in no acute distress in a pleasant mood today.  Normal affect.   15 minutes time spent with patient > half in counseling coordination of care

## 2018-01-12 NOTE — Patient Instructions (Addendum)
Glad you are better. You have gastritis and duodenitis - inflammation of the stomach and intestine from acid.  We are trying 20 mg omeprazole dose  If that does not work we can go back to 40 mg - just call us.  Take an antacid or Pepto Bismol as needed when food bothers your stomach.  Once I see the colonoscopy report I am requesting will let you know when to have another one. If you do not here in a few weeks call me back.  Try [place] yogurt and fresh fruit for breakfast. You can also try the Mediterranean diet - see attached.  I appreciate the opportunity to care for you. Gatha Mayer, MD, [facility]    Mediterranean Diet A Mediterranean diet refers to food and lifestyle choices that are based on the traditions of countries located on the [place]. This way of eating has been shown to help prevent certain conditions and improve outcomes for people who have chronic diseases, like kidney disease and heart disease. What are tips for following this plan? Lifestyle  Cook and eat meals together with your family, when possible.  Drink enough fluid to keep your urine clear or pale yellow.  Be physically active every day. This includes: ? Aerobic exercise like running or swimming. ? Leisure activities like gardening, walking, or housework.  Get 7-8 hours of sleep each night.  If recommended by your health care provider, drink red wine in moderation. This means 1 glass a day for nonpregnant women and 2 glasses a day for men. A glass of wine equals 5 oz (150 mL). Reading food labels  Check the serving size of packaged foods. For foods such as rice and pasta, the serving size refers to the amount of cooked product, not dry.  Check the total fat in packaged foods. Avoid foods that have saturated fat or trans fats.  Check the ingredients list for added sugars, such as corn syrup. Shopping  At the grocery store, buy most of your food from the areas near the walls of the  store. This includes: ? Fresh fruits and vegetables (produce). ? Grains, beans, nuts, and seeds. Some of these may be available in unpackaged forms or large amounts (in bulk). ? Fresh seafood. ? Poultry and eggs. ? Low-fat dairy products.  Buy whole ingredients instead of prepackaged foods.  Buy fresh fruits and vegetables in-season from local farmers markets.  Buy frozen fruits and vegetables in resealable bags.  If you do not have access to quality fresh seafood, buy precooked frozen shrimp or canned fish, such as tuna, salmon, or sardines.  Buy small amounts of raw or cooked vegetables, salads, or olives from the deli or salad bar at your store.  Stock your pantry so you always have certain foods on hand, such as olive oil, canned tuna, canned tomatoes, rice, pasta, and beans. Cooking  Cook foods with extra-virgin olive oil instead of using butter or other vegetable oils.  Have meat as a side dish, and have vegetables or grains as your main dish. This means having meat in small portions or adding small amounts of meat to foods like pasta or stew.  Use beans or vegetables instead of meat in common dishes like chili or lasagna.  Experiment with different cooking methods. Try roasting or broiling vegetables instead of steaming or sauteing them.  Add frozen vegetables to soups, stews, pasta, or rice.  Add nuts or seeds for added healthy fat at each meal. You can add  these to yogurt, salads, or vegetable dishes.  Marinate fish or vegetables using olive oil, lemon juice, garlic, and fresh herbs. Meal planning  Plan to eat 1 vegetarian meal one day each week. Try to work up to 2 vegetarian meals, if possible.  Eat seafood 2 or more times a week.  Have healthy snacks readily available, such as: ? Vegetable sticks with hummus. ? [place] yogurt. ? Fruit and nut trail mix.  Eat balanced meals throughout the week. This includes: ? Fruit: 2-3 servings a day ? Vegetables: 4-5  servings a day ? Low-fat dairy: 2 servings a day ? Fish, poultry, or lean meat: 1 serving a day ? Beans and legumes: 2 or more servings a week ? Nuts and seeds: 1-2 servings a day ? Whole grains: 6-8 servings a day ? Extra-virgin olive oil: 3-4 servings a day  Limit red meat and sweets to only a few servings a month What are my food choices?  Mediterranean diet ? Recommended ? Grains: Whole-grain pasta. Brown rice. Bulgar wheat. Polenta. Couscous. Whole-wheat bread. [place]. ? Vegetables: Artichokes. Beets. Broccoli. Cabbage. Carrots. Eggplant. Green beans. Chard. Kale. Spinach. Onions. Leeks. Peas. Squash. Tomatoes. Peppers. Radishes. ? Fruits: Apples. Apricots. Avocado. Berries. Bananas. Cherries. Dates. Figs. Grapes. Lemons. Melon. Oranges. Peaches. Plums. Pomegranate. ? Meats and other protein foods: Beans. Almonds. Sunflower seeds. Pine nuts. Peanuts. [place]. Salmon. Scallops. Shrimp. [place]. Tilapia. Clams. Oysters. Eggs. ? Dairy: Low-fat milk. Cheese. Greek yogurt. ? Beverages: Water. Red wine. Herbal tea. ? Fats and oils: Extra virgin olive oil. Avocado oil. Grape seed oil. ? Sweets and desserts: [place] yogurt with honey. Baked apples. Poached pears. Trail mix. ? Seasoning and other foods: Basil. Cilantro. Coriander. Cumin. Mint. Parsley. Sage. Rosemary. Tarragon. Garlic. Oregano. Thyme. Pepper. Balsalmic vinegar. Tahini. Hummus. Tomato sauce. Olives. Mushrooms. ? Limit these ? Grains: Prepackaged pasta or rice dishes. Prepackaged cereal with added sugar. ? Vegetables: Deep fried potatoes (french fries). ? Fruits: Fruit canned in syrup. ? Meats and other protein foods: Beef. Pork. Lamb. Poultry with skin. Hot dogs. Bernie[place] Salines. ? Dairy: Ice cream. Sour cream. Whole milk. ? Beverages: Juice. Sugar-sweetened soft drinks. Beer. Liquor and spirits. ? Fats and oils: Butter. Canola oil. Vegetable oil. Beef fat (tallow). Lard. ? Sweets and desserts: Cookies. Cakes. Pies.  Candy. ? Seasoning and other foods: Mayonnaise. Premade sauces and marinades. ? The items listed may not be a complete list. Talk with your dietitian about what dietary choices are right for you. Summary  The Mediterranean diet includes both food and lifestyle choices.  Eat a variety of fresh fruits and vegetables, beans, nuts, seeds, and whole grains.  Limit the amount of red meat and sweets that you eat.  Talk with your health care provider about whether it is safe for you to drink red wine in moderation. This means 1 glass a day for nonpregnant women and 2 glasses a day for men. A glass of wine equals 5 oz (150 mL). This information is not intended to replace advice given to you by your health care provider. Make sure you discuss any questions you have with your health care provider. Document Released: 01/01/2016 Document Revised: 02/03/2016 Document Reviewed: 01/01/2016 Elsevier Interactive Patient Education  [employer].

## 2018-02-28 ENCOUNTER — Ambulatory Visit (INDEPENDENT_AMBULATORY_CARE_PROVIDER_SITE_OTHER): Payer: PPO

## 2018-02-28 DIAGNOSIS — Z23 Encounter for immunization: Secondary | ICD-10-CM

## 2018-02-28 NOTE — Progress Notes (Signed)
Pt presented today for influenza vaccination. IM injection given in L deltoid. Pt tolerated well. No observed S/S prior to leaving office. Vaccination information given to pt.

## 2018-03-23 ENCOUNTER — Encounter: Payer: Self-pay | Admitting: Family Medicine

## 2018-03-23 ENCOUNTER — Ambulatory Visit: Payer: PPO | Admitting: Nurse Practitioner

## 2018-03-23 ENCOUNTER — Ambulatory Visit (INDEPENDENT_AMBULATORY_CARE_PROVIDER_SITE_OTHER): Payer: PPO | Admitting: Family Medicine

## 2018-03-23 VITALS — BP 112/66 | HR 64 | Temp 98.4°F | Ht 75.5 in | Wt 235.0 lb

## 2018-03-23 DIAGNOSIS — M25561 Pain in right knee: Secondary | ICD-10-CM

## 2018-03-23 DIAGNOSIS — M25562 Pain in left knee: Secondary | ICD-10-CM | POA: Insufficient documentation

## 2018-03-23 DIAGNOSIS — G8929 Other chronic pain: Secondary | ICD-10-CM | POA: Insufficient documentation

## 2018-03-23 NOTE — Assessment & Plan Note (Signed)
Pain likely patellofemoral in nature.  May have a component of arthritis but no effusion on exam today.  Related to going up and down stairs. -Counseled on supportive care and home exercise therapy. -Provided samples of Pennsaid. -If no improvement consider imaging, physical therapy or injection.

## 2018-03-23 NOTE — Progress Notes (Signed)
Nicholas Rasmussen - 72 y.o. male MRN [id number]  Date of birth: Dec 13, 1945  SUBJECTIVE:  Including CC & ROS.  Chief Complaint  Patient presents with  . Knee Pain    Pt c/o right knee sharp pain that happens intermittently. Pt sts pain started yesterday. Pt denies pain at the moment. Pt took Aleve for comfort    Nicholas Rasmussen is a 72 y.o. male that is presenting with right knee pain.  The pain was superior lateral in nature.  The pain was occurring after he had moved yesterday.  He had a go up several flights of stairs.  He denies any mechanical symptoms.  Denies any giving way.  The pain is improved today.  He has not had any effusion.  He denies any redness.  He denies any mechanism of injury.  The pain was severe when it occurred.  Today does not have pain.  Pain was localized to the knee.  The pain was stabbing in nature.   Review of Systems  Constitutional: Negative for fever.  HENT: Negative for congestion.   Respiratory: Negative for cough.   Cardiovascular: Negative for chest pain.  Gastrointestinal: Negative for abdominal pain.  Musculoskeletal: Negative for gait problem.  Skin: Negative for color change.  Neurological: Negative for weakness.  Hematological: Negative for adenopathy.  Psychiatric/Behavioral: Negative for agitation.    HISTORY: Past Medical, Surgical, Social, and Family History Reviewed & Updated per EMR.   Pertinent Historical Findings include:  Past Medical History:  Diagnosis Date  . Allergic rhinitis 05/07/2014  . Dizziness 01/02/2016  . GERD (gastroesophageal reflux disease)   . Headache 03/13/2015  . Left flank pain 01/02/2016  . Low back pain 06/06/2015  . Pain of left side of body 08/13/2015  . Post herpetic neuralgia   . Routine general medical examination at a health care facility 05/07/2014  . Seizure disorder ([place]) 05/07/2014  . Seizures ([place])    Last seizure in the 70s.  . Shingles   . Stye 05/07/2014    Past Surgical History:  Procedure  Laterality Date  . COLONOSCOPY    . KNEE SURGERY Left    left miniscus  . UPPER GASTROINTESTINAL ENDOSCOPY    . uvala surgery      No Known Allergies  Family History  Problem Relation Age of Onset  . Diabetes Father   . Hypertension Father   . Other Mother        natural causes     Social History   Socioeconomic History  . Marital status: Single    Spouse name: Not on file  . Number of children: 3  . Years of education: Associates  . Highest education level: Not on file  Occupational History  . Occupation: Retired    Comment: Event organiser  . Financial resource strain: Not on file  . Food insecurity:    Worry: Not on file    Inability: Not on file  . Transportation needs:    Medical: Not on file    Non-medical: Not on file  Tobacco Use  . Smoking status: Former Research scientist (life sciences)  . Smokeless tobacco: Never Used  Substance and Sexual Activity  . Alcohol use: No    Alcohol/week: 0.0 standard drinks  . Drug use: No  . Sexual activity: Not on file  Lifestyle  . Physical activity:    Days per week: Not on file    Minutes per session: Not on file  . Stress: Not on file  Relationships  .  Social connections:    Talks on phone: Not on file    Gets together: Not on file    Attends religious service: Not on file    Active member of club or organization: Not on file    Attends meetings of clubs or organizations: Not on file    Relationship status: Not on file  . Intimate partner violence:    Fear of current or ex partner: Not on file    Emotionally abused: Not on file    Physically abused: Not on file    Forced sexual activity: Not on file  Other Topics Concern  . 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   Right-handed.   Rarely drinks caffeine.     PHYSICAL EXAM:  VS: BP 112/66 (BP Location: Right Arm, Patient  Position: Sitting, Cuff Size: Large)   Pulse 64   Temp 98.4 F (36.9 C) (Oral)   Ht 6' 3.5" (1.918 m)   Wt 235 lb (106.6 kg)   SpO2 98%   BMI 28.99 kg/m  Physical Exam Gen: NAD, alert, cooperative with exam, well-appearing ENT: normal lips, normal nasal mucosa,  Eye: normal EOM, normal conjunctiva and lids CV:  no edema, +2 pedal pulses   Resp: no accessory muscle use, non-labored,  Skin: no rashes, no areas of induration  Neuro: normal tone, normal sensation to touch Psych:  normal insight, alert and oriented MSK:  Right knee: No effusion. Normal range of motion. Normal strength resistance. No instability. No J sign. No pain with patellar grind compression. Negative McMurray's test. Neurovascular intact     ASSESSMENT & PLAN:   Acute pain of right knee Pain likely patellofemoral in nature.  May have a component of arthritis but no effusion on exam today.  Related to going up and down stairs. -Counseled on supportive care and home exercise therapy. -Provided samples of Pennsaid. -If no improvement consider imaging, physical therapy or injection.

## 2018-03-23 NOTE — Patient Instructions (Signed)
Good to see you  Please try the exercises  Please try the rub on medicine if the pain returns.  Please see me back if the pain occurs again.

## 2018-03-29 ENCOUNTER — Encounter: Payer: Self-pay | Admitting: Nurse Practitioner

## 2018-03-29 ENCOUNTER — Telehealth: Payer: Self-pay | Admitting: Internal Medicine

## 2018-03-29 ENCOUNTER — Ambulatory Visit (INDEPENDENT_AMBULATORY_CARE_PROVIDER_SITE_OTHER): Payer: PPO | Admitting: Nurse Practitioner

## 2018-03-29 VITALS — BP 108/66 | HR 51 | Temp 98.2°F | Ht 75.5 in | Wt 236.0 lb

## 2018-03-29 DIAGNOSIS — E782 Mixed hyperlipidemia: Secondary | ICD-10-CM | POA: Diagnosis not present

## 2018-03-29 DIAGNOSIS — I25119 Atherosclerotic heart disease of native coronary artery with unspecified angina pectoris: Secondary | ICD-10-CM | POA: Diagnosis not present

## 2018-03-29 DIAGNOSIS — E1141 Type 2 diabetes mellitus with diabetic mononeuropathy: Secondary | ICD-10-CM | POA: Diagnosis not present

## 2018-03-29 DIAGNOSIS — E1140 Type 2 diabetes mellitus with diabetic neuropathy, unspecified: Secondary | ICD-10-CM | POA: Insufficient documentation

## 2018-03-29 DIAGNOSIS — J301 Allergic rhinitis due to pollen: Secondary | ICD-10-CM

## 2018-03-29 DIAGNOSIS — E1142 Type 2 diabetes mellitus with diabetic polyneuropathy: Secondary | ICD-10-CM | POA: Insufficient documentation

## 2018-03-29 LAB — POCT GLYCOSYLATED HEMOGLOBIN (HGB A1C): Hemoglobin A1C: 5.7 % — AB (ref 4.0–5.6)

## 2018-03-29 MED ORDER — CETIRIZINE HCL 5 MG PO TABS: 5.0000 mg | ORAL_TABLET | Freq: Every day | ORAL | 0 refills | Status: DC

## 2018-03-29 MED ORDER — FLUTICASONE PROPIONATE 50 MCG/ACT NA SUSP: 2.0000 | Freq: Every day | NASAL | 0 refills | Status: DC

## 2018-03-29 NOTE — Telephone Encounter (Signed)
Dr. Carlean Purl please review Endo/colon report scanned in [facility] from 2015 and advise about recall date

## 2018-03-29 NOTE — Patient Instructions (Addendum)
Sign medical release to get records from Dr. Kathlen Mody.   Diabetes and Foot Care Diabetes may cause you to have problems because of poor blood supply (circulation) to your feet and legs. This may cause the skin on your feet to become thinner, break easier, and heal more slowly. Your skin may become dry, and the skin may peel and crack. You may also have nerve damage in your legs and feet causing decreased feeling in them. You may not notice minor injuries to your feet that could lead to infections or more serious problems. Taking care of your feet is one of the most important things you can do for yourself. Follow these instructions at home:  Wear shoes at all times, even in the house. Do not go barefoot. Bare feet are easily injured.  Check your feet daily for blisters, cuts, and redness. If you cannot see the bottom of your feet, use a mirror or ask someone for help.  Wash your feet with warm water (do not use hot water) and mild soap. Then pat your feet and the areas between your toes until they are completely dry. Do not soak your feet as this can dry your skin.  Apply a moisturizing lotion or petroleum jelly (that does not contain alcohol and is unscented) to the skin on your feet and to dry, brittle toenails. Do not apply lotion between your toes.  Trim your toenails straight across. Do not dig under them or around the cuticle. File the edges of your nails with an emery board or nail file.  Do not cut corns or calluses or try to remove them with medicine.  Wear clean socks or stockings every day. Make sure they are not too tight. Do not wear knee-high stockings since they may decrease blood flow to your legs.  Wear shoes that fit properly and have enough cushioning. To break in new shoes, wear them for just a few hours a day. This prevents you from injuring your feet. Always look in your shoes before you put them on to be sure there are no objects inside.  Do not cross your legs. This may  decrease the blood flow to your feet.  If you find a minor scrape, cut, or break in the skin on your feet, keep it and the skin around it clean and dry. These areas may be cleansed with mild soap and water. Do not cleanse the area with peroxide, alcohol, or iodine.  When you remove an adhesive bandage, be sure not to damage the skin around it.  If you have a wound, look at it several times a day to make sure it is healing.  Do not use heating pads or hot water bottles. They may burn your skin. If you have lost feeling in your feet or legs, you may not know it is happening until it is too late.  Make sure your health care provider performs a complete foot exam at least annually or more often if you have foot problems. Report any cuts, sores, or bruises to your health care provider immediately. Contact a health care provider if:  You have an injury that is not healing.  You have cuts or breaks in the skin.  You have an ingrown nail.  You notice redness on your legs or feet.  You feel burning or tingling in your legs or feet.  You have pain or cramps in your legs and feet.  Your legs or feet are numb.  Your feet always  feel cold. Get help right away if:  There is increasing redness, swelling, or pain in or around a wound.  There is a red line that goes up your leg.  Pus is coming from a wound.  You develop a fever or as directed by your health care provider.  You notice a bad smell coming from an ulcer or wound. This information is not intended to replace advice given to you by your health care provider. Make sure you discuss any questions you have with your health care provider. Document Released: 05/07/2000 Document Revised: 10/16/2015 Document Reviewed: 10/17/2012 Elsevier Interactive Patient Education  2017 [employer].

## 2018-03-29 NOTE — Progress Notes (Signed)
Subjective:  Patient ID: Nicholas Rasmussen, male    DOB: 02-24-1946  Age: 72 y.o. MRN: [id number]  CC: Follow-up (3 mo fu/fasting/congestions,cant clear throat--going on 1 wk--no otc. FYi--got eye check this year,no Foot doc)   HPI  CAD: Stable with metoprolol, and atorvastatin Controlled BP, no tobacco use. Exercise 3x/week: walking and competitive dancing Lipid Panel     Component Value Date/Time   CHOL 123 08/04/2017 0904   TRIG 53 08/04/2017 0904   HDL 47 08/04/2017 0904   CHOLHDL 2.6 08/04/2017 0904   CHOLHDL 4 11/17/2016 1009   VLDL 17.8 11/17/2016 1009   LDLCALC 65 08/04/2017 0904   DM: Controlled with metformin Last hgb A1c 6.3 Up to date with eye exam: normal per patient Intermittent numbness in feet.  Hx of Seizure: managed by Dr. Krista Blue. Denies any seizure activity since last OV.  Reviewed past Medical, Social and Family history today.  Outpatient Medications Prior to Visit  Medication Sig Dispense Refill  . acetaminophen (TYLENOL) 500 MG tablet Take 1 tablet (500 mg total) by mouth every 8 (eight) hours as needed. 30 tablet 0  . atorvastatin (LIPITOR) 20 MG tablet Take 1 tablet (20 mg total) by mouth daily. 90 tablet 3  . divalproex (DEPAKOTE ER) 500 MG 24 hr tablet TAKE 1 TABLET (500 MG TOTAL) BY MOUTH AT BEDTIME. 90 tablet 0  . gabapentin (NEURONTIN) 600 MG tablet Take 600 mg by mouth at bedtime as needed (pain).    . metFORMIN (GLUCOPHAGE-XR) 500 MG 24 hr tablet Take 1 tablet (500 mg total) by mouth daily with breakfast. 90 tablet 3  . metoprolol succinate (TOPROL-XL) 25 MG 24 hr tablet Take 1 tablet (25 mg total) by mouth daily. 90 tablet 3  . omeprazole (PRILOSEC) 20 MG capsule Take 1 capsule (20 mg total) by mouth daily before breakfast. 90 capsule 3   No facility-administered medications prior to visit.     ROS See HPI  Objective:  BP 108/66   Pulse (!) 51   Temp 98.2 F (36.8 C) (Oral)   Ht 6' 3.5" (1.918 m)   Wt 236 lb (107 kg)   SpO2 96%    BMI 29.11 kg/m   BP Readings from Last 3 Encounters:  03/29/18 108/66  03/23/18 112/66  01/12/18 110/62    Wt Readings from Last 3 Encounters:  03/29/18 236 lb (107 kg)  03/23/18 235 lb (106.6 kg)  01/12/18 235 lb (106.6 kg)    Physical Exam  Constitutional: He is oriented to person, place, and time. He appears well-developed and well-nourished.  Neck: Normal range of motion. Neck supple.  Cardiovascular: Normal rate, regular rhythm and normal heart sounds.  Pulmonary/Chest: Effort normal and breath sounds normal.  Musculoskeletal: He exhibits no edema.  Neurological: He is alert and oriented to person, place, and time.  Skin: Skin is warm and dry. No erythema.  Abnormal foot exam  Psychiatric: He has a normal mood and affect. His behavior is normal. Thought content normal.  Vitals reviewed.   Lab Results  Component Value Date   WBC 6.9 10/15/2017   HGB 12.4 (L) 10/15/2017   HCT 38.8 (L) 10/15/2017   PLT 202 10/15/2017   GLUCOSE 105 (H) 11/23/2017   CHOL 123 08/04/2017   TRIG 53 08/04/2017   HDL 47 08/04/2017   LDLCALC 65 08/04/2017   ALT 11 (L) 10/15/2017   AST 22 10/15/2017   NA 140 11/23/2017   K 3.9 11/23/2017   CL 104 11/23/2017  CREATININE 0.95 11/23/2017   BUN 15 11/23/2017   CO2 30 11/23/2017   TSH 1.08 11/12/2014   PSA 0.42 11/17/2016   HGBA1C 5.7 (A) 03/29/2018   MICROALBUR 1.4 11/23/2017    Dg Chest 2 View  Result Date: 10/15/2017 CLINICAL DATA:  Chest pain EXAM: CHEST - 2 VIEW COMPARISON:  Chest CT 06/22/2017 FINDINGS: The heart size and mediastinal contours are within normal limits. Both lungs are clear. The visualized skeletal structures are unremarkable. IMPRESSION: No active cardiopulmonary disease. Electronically Signed   By: Ulyses Jarred M.D.   On: 10/15/2017 22:26    Assessment & Plan:   Nicholas Rasmussen was seen today for follow-up.  Diagnoses and all orders for this visit:  Type 2 diabetes mellitus with diabetic mononeuropathy, without  long-term current use of insulin (HCC) -     POCT glycosylated hemoglobin (Hb A1C)  Seasonal allergic rhinitis due to pollen -     fluticasone (FLONASE) 50 MCG/ACT nasal spray; Place 2 sprays into both nostrils daily. -     cetirizine (ZYRTEC) 5 MG tablet; Take 1 tablet (5 mg total) by mouth at bedtime.  Mixed hyperlipidemia  Coronary artery disease involving native coronary artery of native heart with angina pectoris ([place])  Diabetic mononeuropathy associated with type 2 diabetes mellitus ([place])   I am having Nicholas Rasmussen start on fluticasone and cetirizine. I am also having him maintain his gabapentin, atorvastatin, metoprolol succinate, acetaminophen, metFORMIN, divalproex, and omeprazole.  Meds ordered this encounter  Medications  . fluticasone (FLONASE) 50 MCG/ACT nasal spray    Sig: Place 2 sprays into both nostrils daily.    Dispense:  16 g    Refill:  0    Order Specific Question:   Supervising Provider    Answer:   Lucille Passy [[id number]]  . cetirizine (ZYRTEC) 5 MG tablet    Sig: Take 1 tablet (5 mg total) by mouth at bedtime.    Dispense:  14 tablet    Refill:  0    Order Specific Question:   Supervising Provider    Answer:   Lucille Passy [[id number]]    Follow-up: Return in about 6 months (around 09/27/2018) for DM and HTN, hyperlipidemia (fasting).  [name], NP

## 2018-03-29 NOTE — Assessment & Plan Note (Signed)
LDL at goal with atorvastatin, LDL 65 BP at goal BP Readings from Last 3 Encounters:  03/29/18 108/66  03/23/18 112/66  01/12/18 110/62   DM controlled with metfomin, HgbA1c 5.7

## 2018-03-29 NOTE — Assessment & Plan Note (Signed)
Controlled with metformin. HgbA1c 5.7

## 2018-03-30 ENCOUNTER — Telehealth: Payer: Self-pay | Admitting: Nurse Practitioner

## 2018-03-30 NOTE — Telephone Encounter (Signed)
Colon recall 01/2024

## 2018-03-30 NOTE — Telephone Encounter (Signed)
Called and spoke to pt-pt made aware of colonoscopy results and advised pt of MD recommendations of next colonoscopy due date of 01/2024;  Pt verbalized understanding; pt advised to call back if questions/concerns arise;

## 2018-03-30 NOTE — Telephone Encounter (Signed)
Copied from [place] [id number]. Topic: Quick Communication - See Telephone Encounter >> Mar 30, 2018 10:39 AM Bea Graff, NT wrote: CRM for notification. See Telephone encounter for: 03/30/18. Pt states that his insurance will not cover the cetirizine (ZYRTEC) 5 MG tablet and he would like to see if something else can be ordered. Please advise.

## 2018-03-30 NOTE — Telephone Encounter (Signed)
Spoke with the pt, he can wait until [place] comes back tomorrow.

## 2018-03-31 NOTE — Telephone Encounter (Signed)
He can get medication OTC.

## 2018-03-31 NOTE — Telephone Encounter (Signed)
Pt verbalized understand.  

## 2018-04-05 ENCOUNTER — Other Ambulatory Visit: Payer: Self-pay | Admitting: Neurology

## 2018-04-07 ENCOUNTER — Encounter: Payer: Self-pay | Admitting: Nurse Practitioner

## 2018-04-07 NOTE — Progress Notes (Signed)
Abstracted result and sent to scan  

## 2018-04-27 ENCOUNTER — Other Ambulatory Visit: Payer: Self-pay | Admitting: Nurse Practitioner

## 2018-04-27 DIAGNOSIS — J301 Allergic rhinitis due to pollen: Secondary | ICD-10-CM

## 2018-04-27 MED ORDER — FLUTICASONE PROPIONATE 50 MCG/ACT NA SUSP: 2.0000 | Freq: Every day | NASAL | 0 refills | Status: DC

## 2018-04-27 NOTE — Telephone Encounter (Signed)
Received refill request for flonase from [facility]. Rx sent.

## 2018-05-26 ENCOUNTER — Other Ambulatory Visit: Payer: Self-pay | Admitting: Nurse Practitioner

## 2018-05-26 DIAGNOSIS — J301 Allergic rhinitis due to pollen: Secondary | ICD-10-CM

## 2018-05-26 MED ORDER — FLUTICASONE PROPIONATE 50 MCG/ACT NA SUSP: 2.0000 | Freq: Every day | NASAL | 2 refills | Status: DC

## 2018-05-29 ENCOUNTER — Ambulatory Visit (INDEPENDENT_AMBULATORY_CARE_PROVIDER_SITE_OTHER): Payer: PPO | Admitting: Nurse Practitioner

## 2018-05-29 ENCOUNTER — Encounter: Payer: Self-pay | Admitting: Nurse Practitioner

## 2018-05-29 VITALS — BP 122/78 | HR 51 | Temp 98.7°F | Ht 75.5 in | Wt 241.8 lb

## 2018-05-29 DIAGNOSIS — J101 Influenza due to other identified influenza virus with other respiratory manifestations: Secondary | ICD-10-CM

## 2018-05-29 LAB — POC INFLUENZA A&B (BINAX/QUICKVUE)
Influenza A, POC: POSITIVE — AB
Influenza B, POC: NEGATIVE

## 2018-05-29 MED ORDER — CHLORPHEN-PE-ACETAMINOPHEN 4-10-325 MG PO TABS: 1.0000 | ORAL_TABLET | Freq: Two times a day (BID) | ORAL | 0 refills | Status: AC

## 2018-05-29 MED ORDER — OSELTAMIVIR PHOSPHATE 75 MG PO CAPS
75.0000 mg | ORAL_CAPSULE | Freq: Two times a day (BID) | ORAL | 0 refills | Status: DC
Start: 2018-05-29 — End: 2018-08-16

## 2018-05-29 MED ORDER — HYDROCODONE-HOMATROPINE 5-1.5 MG/5ML PO SYRP: 5.0000 mL | ORAL_SOLUTION | Freq: Two times a day (BID) | ORAL | 0 refills | Status: DC | PRN

## 2018-05-29 MED ORDER — GUAIFENESIN-DM 100-10 MG/5ML PO SYRP: 5.0000 mL | ORAL_SOLUTION | ORAL | 0 refills | Status: DC | PRN

## 2018-05-29 NOTE — Progress Notes (Signed)
Subjective:  Patient ID: Nicholas Rasmussen, male    DOB: [birth year]  Age: 73 y.o. MRN: [id number]  CC: Cough (sore throat, bad dry cough-- mucus yellow, ears painfull-fullness,bodyache,dizzy congestion/ lasting x3 days/ OTC rubitussin, tylenol)  URI   This is a new problem. The current episode started yesterday. The problem has been unchanged. The maximum temperature recorded prior to his arrival was 100.4 - 100.9 F. Associated symptoms include congestion, coughing, ear pain, headaches, joint pain, a plugged ear sensation, rhinorrhea, sinus pain, sneezing, a sore throat and swollen glands. Pertinent negatives include no wheezing. He has tried decongestant and acetaminophen for the symptoms. The treatment provided mild relief.    Reviewed past Medical, Social and Family history today.  Outpatient Medications Prior to Visit  Medication Sig Dispense Refill  . acetaminophen (TYLENOL) 500 MG tablet Take 1 tablet (500 mg total) by mouth every 8 (eight) hours as needed. 30 tablet 0  . atorvastatin (LIPITOR) 20 MG tablet Take 1 tablet (20 mg total) by mouth daily. 90 tablet 3  . cetirizine (ZYRTEC) 5 MG tablet Take 1 tablet (5 mg total) by mouth at bedtime. 14 tablet 0  . divalproex (DEPAKOTE ER) 500 MG 24 hr tablet Take 1 tablet (500 mg total) by mouth at bedtime. Please call [phone] to schedule follow up. 90 tablet 1  . fluticasone (FLONASE) 50 MCG/ACT nasal spray Place 2 sprays into both nostrils daily. 16 g 2  . gabapentin (NEURONTIN) 600 MG tablet Take 600 mg by mouth at bedtime as needed (pain).    . metFORMIN (GLUCOPHAGE-XR) 500 MG 24 hr tablet Take 1 tablet (500 mg total) by mouth daily with breakfast. 90 tablet 3  . metoprolol succinate (TOPROL-XL) 25 MG 24 hr tablet Take 1 tablet (25 mg total) by mouth daily. 90 tablet 3  . omeprazole (PRILOSEC) 20 MG capsule Take 1 capsule (20 mg total) by mouth daily before breakfast. 90 capsule 3   No facility-administered medications prior to visit.      ROS See HPI  Objective:  BP 122/78   Pulse (!) 51   Temp 98.7 F (37.1 C) (Oral)   Ht 6' 3.5" (1.918 m)   Wt 241 lb 12.8 oz (109.7 kg)   SpO2 96%   BMI 29.82 kg/m   BP Readings from Last 3 Encounters:  05/29/18 122/78  03/29/18 108/66  03/23/18 112/66    Wt Readings from Last 3 Encounters:  05/29/18 241 lb 12.8 oz (109.7 kg)  03/29/18 236 lb (107 kg)  03/23/18 235 lb (106.6 kg)    Physical Exam Vitals signs reviewed.  HENT:     Right Ear: Tympanic membrane, ear canal and external ear normal.     Left Ear: Tympanic membrane and ear canal normal.     Nose: Mucosal edema and rhinorrhea present.     Right Sinus: Maxillary sinus tenderness and frontal sinus tenderness present.     Left Sinus: Maxillary sinus tenderness and frontal sinus tenderness present.     Mouth/Throat:     Pharynx: Uvula midline. Posterior oropharyngeal erythema present. No oropharyngeal exudate.  Eyes:     General: No scleral icterus. Neck:     Musculoskeletal: Normal range of motion and neck supple.  Cardiovascular:     Rate and Rhythm: Normal rate and regular rhythm.  Pulmonary:     Effort: Pulmonary effort is normal.     Breath sounds: Normal breath sounds.  Lymphadenopathy:     Cervical: Cervical adenopathy present.  Neurological:  Mental Status: He is alert and oriented to person, place, and time.     Lab Results  Component Value Date   WBC 6.9 10/15/2017   HGB 12.4 (L) 10/15/2017   HCT 38.8 (L) 10/15/2017   PLT 202 10/15/2017   GLUCOSE 105 (H) 11/23/2017   CHOL 123 08/04/2017   TRIG 53 08/04/2017   HDL 47 08/04/2017   LDLCALC 65 08/04/2017   ALT 11 (L) 10/15/2017   AST 22 10/15/2017   NA 140 11/23/2017   K 3.9 11/23/2017   CL 104 11/23/2017   CREATININE 0.95 11/23/2017   BUN 15 11/23/2017   CO2 30 11/23/2017   TSH 1.08 11/12/2014   PSA 0.42 11/17/2016   HGBA1C 5.7 (A) 03/29/2018   MICROALBUR 1.4 11/23/2017    Dg Chest 2 View  Result Date:  10/15/2017 CLINICAL DATA:  Chest pain EXAM: CHEST - 2 VIEW COMPARISON:  Chest CT 06/22/2017 FINDINGS: The heart size and mediastinal contours are within normal limits. Both lungs are clear. The visualized skeletal structures are unremarkable. IMPRESSION: No active cardiopulmonary disease. Electronically Signed   By: Ulyses Jarred M.D.   On: 10/15/2017 22:26    Assessment & Plan:   Nicholas Rasmussen was seen today for cough.  Diagnoses and all orders for this visit:  Influenza A -     POC Influenza A&B(BINAX/QUICKVUE) -     oseltamivir (TAMIFLU) 75 MG capsule; Take 1 capsule (75 mg total) by mouth 2 (two) times daily. -     HYDROcodone-homatropine (HYCODAN) 5-1.5 MG/5ML syrup; Take 5 mLs by mouth every 12 (twelve) hours as needed for cough. -     guaiFENesin-dextromethorphan (ROBITUSSIN DM) 100-10 MG/5ML syrup; Take 5 mLs by mouth every 4 (four) hours as needed for cough. -     Chlorphen-PE-Acetaminophen 4-10-325 MG TABS; Take 1 tablet by mouth every 12 (twelve) hours for 2 days.   I am having Nicholas Rasmussen start on oseltamivir, HYDROcodone-homatropine, guaiFENesin-dextromethorphan, and Chlorphen-PE-Acetaminophen. I am also having him maintain his gabapentin, atorvastatin, metoprolol succinate, acetaminophen, metFORMIN, omeprazole, cetirizine, divalproex, and fluticasone.  Meds ordered this encounter  Medications  . oseltamivir (TAMIFLU) 75 MG capsule    Sig: Take 1 capsule (75 mg total) by mouth 2 (two) times daily.    Dispense:  10 capsule    Refill:  0    Order Specific Question:   Supervising Provider    Answer:   Lucille Passy [[id number]]  . HYDROcodone-homatropine (HYCODAN) 5-1.5 MG/5ML syrup    Sig: Take 5 mLs by mouth every 12 (twelve) hours as needed for cough.    Dispense:  60 mL    Refill:  0    Order Specific Question:   Supervising Provider    Answer:   Lucille Passy [[id number]]  . guaiFENesin-dextromethorphan (ROBITUSSIN DM) 100-10 MG/5ML syrup    Sig: Take 5 mLs by mouth every 4 (four)  hours as needed for cough.    Dispense:  118 mL    Refill:  0    Order Specific Question:   Supervising Provider    Answer:   Lucille Passy [[id number]]  . Chlorphen-PE-Acetaminophen 4-10-325 MG TABS    Sig: Take 1 tablet by mouth every 12 (twelve) hours for 2 days.    Dispense:  4 tablet    Refill:  0    Order Specific Question:   Supervising Provider    Answer:   Lucille Passy [[id number]]    Problem List Items Addressed This Visit  None    Visit Diagnoses    Influenza A    -  Primary   Relevant Medications   oseltamivir (TAMIFLU) 75 MG capsule   HYDROcodone-homatropine (HYCODAN) 5-1.5 MG/5ML syrup   guaiFENesin-dextromethorphan (ROBITUSSIN DM) 100-10 MG/5ML syrup   Other Relevant Orders   POC Influenza A&B(BINAX/QUICKVUE) (Completed)       Follow-up: Return if symptoms worsen or fail to improve.  [name], NP

## 2018-05-29 NOTE — Patient Instructions (Signed)

## 2018-05-30 ENCOUNTER — Ambulatory Visit: Payer: PPO | Admitting: Nurse Practitioner

## 2018-06-01 ENCOUNTER — Encounter: Payer: Self-pay | Admitting: Nurse Practitioner

## 2018-06-10 ENCOUNTER — Other Ambulatory Visit: Payer: Self-pay | Admitting: Interventional Cardiology

## 2018-06-17 ENCOUNTER — Other Ambulatory Visit: Payer: Self-pay | Admitting: Interventional Cardiology

## 2018-08-05 ENCOUNTER — Encounter: Payer: Self-pay | Admitting: Nurse Practitioner

## 2018-08-10 ENCOUNTER — Encounter: Payer: Self-pay | Admitting: Interventional Cardiology

## 2018-08-15 ENCOUNTER — Telehealth: Payer: Self-pay | Admitting: Nurse Practitioner

## 2018-08-15 ENCOUNTER — Telehealth: Payer: Self-pay

## 2018-08-15 NOTE — Telephone Encounter (Signed)
Please have him schedule a virtual visit (telephone) with me tomorrow. Thank you

## 2018-08-15 NOTE — Telephone Encounter (Signed)
Please help schedule the pt

## 2018-08-15 NOTE — Telephone Encounter (Signed)
New message   Left a message on home voice mail to call back to reschedule an appointment due to mandatory government regulations regarding COVID-19, Your physician has reviewed and has agreed to have your test postponed and rescheduled for a later date.

## 2018-08-15 NOTE — Telephone Encounter (Signed)
Charlotte please advise, this rx discontinued back in 2018.

## 2018-08-15 NOTE — Telephone Encounter (Signed)
Copied from [facility] [phone]. Topic: General - Other >> Aug 15, 2018  2:32 PM Lennox Solders wrote: Reason for CRM:pt left on refill voicemail he needs a refill on gabapentin 600 mg. [facility] [address]

## 2018-08-16 ENCOUNTER — Ambulatory Visit (HOSPITAL_COMMUNITY): Payer: PPO

## 2018-08-16 ENCOUNTER — Ambulatory Visit (INDEPENDENT_AMBULATORY_CARE_PROVIDER_SITE_OTHER): Payer: PPO | Admitting: Nurse Practitioner

## 2018-08-16 ENCOUNTER — Other Ambulatory Visit: Payer: Self-pay

## 2018-08-16 ENCOUNTER — Encounter: Payer: Self-pay | Admitting: Nurse Practitioner

## 2018-08-16 DIAGNOSIS — B0229 Other postherpetic nervous system involvement: Secondary | ICD-10-CM | POA: Diagnosis not present

## 2018-08-16 MED ORDER — GABAPENTIN 600 MG PO TABS: 300.0000 mg | ORAL_TABLET | Freq: Three times a day (TID) | ORAL | 0 refills | Status: DC

## 2018-08-16 NOTE — Progress Notes (Signed)
Virtual Visit via Telephone Note  I connected with Nicholas Rasmussen on 08/16/18 at 11:00 AM EDT by telephone and verified that I am speaking with the correct person using two identifiers.   I discussed the limitations, risks, security and privacy concerns of performing an evaluation and management service by telephone and the availability of in person appointments. I also discussed with the patient that there may be a patient responsible charge related to this service. The patient expressed understanding and agreed to proceed.  History of Present Illness: Mr. Zink is requesting gabapentin refill. Current prescription expired. He will like to have a current prescription in case he develops about exacerbation of post herpetic neuralgia. This is a chronic problem with intermittent symptoms. Symptoms typically present as right temporal headache and ear fullness. Denies any change in vision or paresthesia or facial asymmetry or rash. He denies any of the above symptoms at this time.   Observations/Objective: Unable to provide any vital signs. He is alert and oriented x 4. Clear speech and normal tone.  Assessment and Plan: Brendan was seen today for medication refill.  Diagnoses and all orders for this visit:  Post herpetic neuralgia -     gabapentin (NEURONTIN) 600 MG tablet; Take 0.5 tablets (300 mg total) by mouth 3 (three) times daily.   Follow Up Instructions: Gabapentin prescription sent. Let me know if you have another herpes zoster outbreak.   I discussed the assessment and treatment plan with the patient. The patient was provided an opportunity to ask questions and all were answered. The patient agreed with the plan and demonstrated an understanding of the instructions.   The patient was advised to call back or seek an in-person evaluation if the symptoms worsen or if the condition fails to improve as anticipated.  I provided 20 minutes of non-face-to-face time during this  encounter.   [name], NP

## 2018-08-16 NOTE — Telephone Encounter (Signed)
I called and spoke to patient. Patient scheduled a telephone visit for today 08/16/2018 with [name] and all information verified and travel screening done.

## 2018-08-16 NOTE — Patient Instructions (Signed)
Gabapentin prescription sent. Let me know if you have another herpes zoster outbreak.

## 2018-08-17 NOTE — Telephone Encounter (Signed)
Spoke with pt and he denies issues.  States he will occasionally have some discomfort after he eats certain foods.  Advised to speak with Dr. Carlean Purl to see if maybe he needs to increase Prilosec.  Pt's echo currently scheduled for mid May.  Scheduled pt to see Dr. Tamala Julian 10/30/2018.  Pt aware to contact the office if any issues.

## 2018-08-21 ENCOUNTER — Ambulatory Visit: Payer: PPO | Admitting: Interventional Cardiology

## 2018-09-07 ENCOUNTER — Other Ambulatory Visit: Payer: Self-pay | Admitting: Interventional Cardiology

## 2018-09-11 ENCOUNTER — Other Ambulatory Visit: Payer: Self-pay | Admitting: Interventional Cardiology

## 2018-09-19 ENCOUNTER — Ambulatory Visit (INDEPENDENT_AMBULATORY_CARE_PROVIDER_SITE_OTHER): Payer: PPO | Admitting: Nurse Practitioner

## 2018-09-19 ENCOUNTER — Encounter: Payer: Self-pay | Admitting: Nurse Practitioner

## 2018-09-19 VITALS — Ht 75.5 in | Wt 235.0 lb

## 2018-09-19 DIAGNOSIS — Z1211 Encounter for screening for malignant neoplasm of colon: Secondary | ICD-10-CM | POA: Insufficient documentation

## 2018-09-19 DIAGNOSIS — Z5181 Encounter for therapeutic drug level monitoring: Secondary | ICD-10-CM | POA: Diagnosis not present

## 2018-09-19 DIAGNOSIS — E782 Mixed hyperlipidemia: Secondary | ICD-10-CM | POA: Diagnosis not present

## 2018-09-19 DIAGNOSIS — E1142 Type 2 diabetes mellitus with diabetic polyneuropathy: Secondary | ICD-10-CM | POA: Diagnosis not present

## 2018-09-19 DIAGNOSIS — G40909 Epilepsy, unspecified, not intractable, without status epilepticus: Secondary | ICD-10-CM

## 2018-09-19 DIAGNOSIS — I25119 Atherosclerotic heart disease of native coronary artery with unspecified angina pectoris: Secondary | ICD-10-CM

## 2018-09-19 NOTE — Progress Notes (Signed)
Virtual Visit via Video Note  I connected with Nicholas Rasmussen on 09/19/18 at 10:00 AM EDT by a video enabled telemedicine application and verified that I am speaking with the correct person using two identifiers.   I discussed the limitations of evaluation and management by telemedicine and the availability of in person appointments. The patient expressed understanding and agreed to proceed.  History of Present Illness: DM: Last hgbA1c 5.7 (03/2018) Current use of metformin Intermittent numbness and tingling of feet. Negative urine microalbumin. No HTN  CAD: LDL at goal with lipitor.  Seizure: Denies any seizure activity in last 1year. Current use of depakote. Managed by neurology: Dr. Krista Rasmussen   Observations/Objective: Unable to provide any vital signs Physical Exam  Constitutional: He is oriented to person, place, and time. No distress.  Pulmonary/Chest: Effort normal.  Neurological: He is alert and oriented to person, place, and time.  Psychiatric: He has a normal mood and affect. His behavior is normal. Thought content normal.   Assessment and Plan: Nicholas Rasmussen was seen today for follow-up.  Diagnoses and all orders for this visit:  Type 2 diabetes mellitus with diabetic polyneuropathy, without long-term current use of insulin (HCC) -     Basic metabolic panel; Future -     Hemoglobin A1c; Future -     Hepatic function panel; Future -     Microalbumin / creatinine urine ratio; Future  Mixed hyperlipidemia -     Lipid panel; Future -     Hepatic function panel; Future  Coronary artery disease involving native coronary artery of native heart with angina pectoris ([place]) -     Lipid panel; Future -     Hepatic function panel; Future  Seizure disorder ([place])  Encounter for therapeutic drug level monitoring -     Valproic Acid level; Future   Follow Up Instructions: F/up in 89months Check BP at home at least once a week. Send readings via mychart. Postpone lab collection due  to covid-19 pandemic. Return to lab for blood and urine collection in 3weeks.   I discussed the assessment and treatment plan with the patient. The patient was provided an opportunity to ask questions and all were answered. The patient agreed with the plan and demonstrated an understanding of the instructions.   The patient was advised to call back or seek an in-person evaluation if the symptoms worsen or if the condition fails to improve as anticipated.   Nicholas Lacy, NP

## 2018-09-19 NOTE — Patient Instructions (Addendum)
Return to lab in 3weeks for blood draw and urine collection. Need to be fasting prior to blood draw.  F/up in 31months Check BP at home at least once a week. Send readings via mychart.

## 2018-09-26 ENCOUNTER — Other Ambulatory Visit: Payer: Self-pay | Admitting: Neurology

## 2018-09-26 ENCOUNTER — Ambulatory Visit: Payer: PPO | Admitting: Nurse Practitioner

## 2018-09-27 ENCOUNTER — Ambulatory Visit: Payer: PPO | Admitting: Nurse Practitioner

## 2018-10-02 ENCOUNTER — Telehealth (HOSPITAL_COMMUNITY): Payer: Self-pay

## 2018-10-02 NOTE — Telephone Encounter (Signed)
Left message to call back for COVID prescreening.

## 2018-10-02 NOTE — Telephone Encounter (Signed)

## 2018-10-04 ENCOUNTER — Other Ambulatory Visit: Payer: Self-pay

## 2018-10-04 ENCOUNTER — Ambulatory Visit (HOSPITAL_COMMUNITY): Payer: PPO | Attending: Cardiovascular Disease

## 2018-10-04 DIAGNOSIS — I712 Thoracic aortic aneurysm, without rupture: Secondary | ICD-10-CM | POA: Insufficient documentation

## 2018-10-04 DIAGNOSIS — I7121 Aneurysm of the ascending aorta, without rupture: Secondary | ICD-10-CM

## 2018-10-10 ENCOUNTER — Other Ambulatory Visit (INDEPENDENT_AMBULATORY_CARE_PROVIDER_SITE_OTHER): Payer: PPO

## 2018-10-10 DIAGNOSIS — E782 Mixed hyperlipidemia: Secondary | ICD-10-CM

## 2018-10-10 DIAGNOSIS — Z5181 Encounter for therapeutic drug level monitoring: Secondary | ICD-10-CM

## 2018-10-10 DIAGNOSIS — E1142 Type 2 diabetes mellitus with diabetic polyneuropathy: Secondary | ICD-10-CM

## 2018-10-10 DIAGNOSIS — I25119 Atherosclerotic heart disease of native coronary artery with unspecified angina pectoris: Secondary | ICD-10-CM | POA: Diagnosis not present

## 2018-10-10 LAB — LIPID PANEL
Cholesterol: 121 mg/dL (ref 0–200)
HDL: 44.1 mg/dL (ref >39.00)
LDL Cholesterol: 66 mg/dL (ref 0–99)
NonHDL: 76.51
Total CHOL/HDL Ratio: 3
Triglycerides: 53 mg/dL (ref 0.0–149.0)
VLDL: 10.6 mg/dL (ref 0.0–40.0)

## 2018-10-10 LAB — BASIC METABOLIC PANEL
BUN: 13 mg/dL (ref 6–23)
CO2: 34 mEq/L — ABNORMAL HIGH (ref 19–32)
Calcium: 9 mg/dL (ref 8.4–10.5)
Chloride: 103 mEq/L (ref 96–112)
Creatinine, Ser: 1.01 mg/dL (ref 0.40–1.50)
GFR: 88.22 mL/min (ref >60.00)
Glucose, Bld: 100 mg/dL — ABNORMAL HIGH (ref 70–99)
Potassium: 4.1 mEq/L (ref 3.5–5.1)
Sodium: 140 mEq/L (ref 135–145)

## 2018-10-10 LAB — HEPATIC FUNCTION PANEL
ALT: 4 U/L (ref 0–53)
AST: 13 U/L (ref 0–37)
Albumin: 4.1 g/dL (ref 3.5–5.2)
Alkaline Phosphatase: 48 U/L (ref 39–117)
Bilirubin, Direct: 0.1 mg/dL (ref 0.0–0.3)
Total Bilirubin: 0.5 mg/dL (ref 0.2–1.2)
Total Protein: 7.1 g/dL (ref 6.0–8.3)

## 2018-10-10 LAB — MICROALBUMIN / CREATININE URINE RATIO
Creatinine,U: 351.7 mg/dL
Microalb Creat Ratio: 0.9 mg/g (ref 0.0–30.0)
Microalb, Ur: 3.3 mg/dL — ABNORMAL HIGH (ref 0.0–1.9)

## 2018-10-10 LAB — HEMOGLOBIN A1C: Hgb A1c MFr Bld: 6.7 % — ABNORMAL HIGH (ref 4.6–6.5)

## 2018-10-11 LAB — VALPROIC ACID LEVEL: Valproic Acid Lvl: 57.6 mg/L (ref 50.0–100.0)

## 2018-10-17 DIAGNOSIS — H35033 Hypertensive retinopathy, bilateral: Secondary | ICD-10-CM | POA: Diagnosis not present

## 2018-10-17 DIAGNOSIS — R7309 Other abnormal glucose: Secondary | ICD-10-CM | POA: Diagnosis not present

## 2018-10-17 DIAGNOSIS — H01022 Squamous blepharitis right lower eyelid: Secondary | ICD-10-CM | POA: Diagnosis not present

## 2018-10-17 DIAGNOSIS — H35363 Drusen (degenerative) of macula, bilateral: Secondary | ICD-10-CM | POA: Diagnosis not present

## 2018-10-17 DIAGNOSIS — Z961 Presence of intraocular lens: Secondary | ICD-10-CM | POA: Diagnosis not present

## 2018-10-17 LAB — HM DIABETES EYE EXAM

## 2018-10-19 ENCOUNTER — Ambulatory Visit (INDEPENDENT_AMBULATORY_CARE_PROVIDER_SITE_OTHER): Payer: PPO | Admitting: Neurology

## 2018-10-19 ENCOUNTER — Other Ambulatory Visit: Payer: Self-pay

## 2018-10-19 ENCOUNTER — Encounter: Payer: Self-pay | Admitting: Neurology

## 2018-10-19 DIAGNOSIS — B0229 Other postherpetic nervous system involvement: Secondary | ICD-10-CM | POA: Diagnosis not present

## 2018-10-19 DIAGNOSIS — G40909 Epilepsy, unspecified, not intractable, without status epilepticus: Secondary | ICD-10-CM | POA: Diagnosis not present

## 2018-10-19 MED ORDER — GABAPENTIN 300 MG PO CAPS: 300.0000 mg | ORAL_CAPSULE | Freq: Every evening | ORAL | 11 refills | Status: DC | PRN

## 2018-10-19 MED ORDER — DIVALPROEX SODIUM ER 500 MG PO TB24: 500.0000 mg | ORAL_TABLET | Freq: Every day | ORAL | 4 refills | Status: DC

## 2018-10-19 NOTE — Progress Notes (Signed)
PATIENT: Nicholas Rasmussen DOB: Dec 16, 1945  No chief complaint on file.    HISTORICAL  Nicholas Rasmussen is a 73 year old right-handed male, seen in refer by his primary care doctor  [name] for evaluation of postherpetic neuralgia, initial evaluation was on March eighth 2018.  He had a history of epilepsy, first seizure was in high school, had about 4 generalized seizure in his lifetime, last seizure was in 1975, all seizure has similar seminology, he would woke up from sleep and felt body vibrating sensation, few seconds later, he would get into generalized tonic-clonic seizure, he was initially treated with Dilantin, after most recent recurrent seizure, he was switched to current dose of Depakote DR 250 mg twice a day, he tolerated the medication well, there was no seizure, no auras.  On July 13 2016, he noticed right frontal area headaches, which was quite unusual for him, next day, he noticed rash broke out, nerve pain, sensitive to touch, he was diagnosed with shingles, was treated with veltrex 1000 mg tid and tapering dose of prednisone for 1 week, rash lasted about 1 week, gradually resolved, for a while, he has sharp and neuropathic pain at right frontal region, difficulty sleeping.   Now his pain has much improved, he is not taking gabapentin 300 mg/600 mg, mild dizziness and lightheadedness with medications, he can sleep much better now  Personally reviewed CT head without contrast November 2017 that was normal. Laboratory evaluation normal CMP, CBC, hemoglobin was 12.5, A1c 6.2  UPDATE Feb 03 2017: Laboratory evaluation seeing August 2018 showed negative troponin, normal CMP, negative d-dimer, CBC, He rarely has right frontal shingles pain, only taking gabapentin occasionally, occasionally right frontal headache is resolved by over-the-counter NSAIDs,  UPDATE July 26 2017; He did very well, was able to switch to Depakote ER 500 mg every night, tolerating it well,  no recurrent seizure, no significant facial pain, only taking gabapentin as needed.  Virtual Visit via Video  I connected with Nicholas Rasmussen on 10/19/18 at  by Video and verified that I am speaking with the correct person using two identifiers.   I discussed the limitations, risks, security and privacy concerns of performing an evaluation and management service by video and the availability of in person appointments. I also discussed with the patient that there may be a patient responsible charge related to this service. The patient expressed understanding and agreed to proceed.   History of Present Illness: He has occasional right frontal area discomfort, take gabapentin 300 mg at nighttime as needed, does not take it regularly  He continue with Depakote ER 500 mg every night, has no recurrent seizures.   Observations/Objective: I have reviewed problem lists, medications, allergies.  Awake, alert, oriented to history taking, conversations, moving 4 extremities without difficulties  Assessment and Plan: Right V1 shingles in February 2018, mild postherpetic neuralgia pain  Has much improved, only take gabapentin 300mg  as needed  Epilepsy, last recurrence seizure in 1975  On low-dose Depakote ER 500 mg every night  Follow Up Instructions:  In 1 year with Judson Roch    I discussed the assessment and treatment plan with the patient. The patient was provided an opportunity to ask questions and all were answered. The patient agreed with the plan and demonstrated an understanding of the instructions.   The patient was advised to call back or seek an in-person evaluation if the symptoms worsen or if the condition fails to improve as anticipated.  I provided 15 minutes of  non-face-to-face time during this encounter.   Marcial Pacas, MD

## 2018-10-20 ENCOUNTER — Encounter: Payer: Self-pay | Admitting: Nurse Practitioner

## 2018-10-25 ENCOUNTER — Telehealth: Payer: Self-pay

## 2018-10-25 NOTE — Telephone Encounter (Signed)
YOUR CARDIOLOGY TEAM HAS ARRANGED FOR AN E-VISIT FOR YOUR APPOINTMENT - PLEASE REVIEW IMPORTANT INFORMATION BELOW SEVERAL DAYS PRIOR TO YOUR APPOINTMENT  Due to the recent COVID-19 pandemic, we are transitioning in-person office visits to tele-medicine visits in an effort to decrease unnecessary exposure to our patients, their families, and staff. These visits are billed to your insurance just like a normal visit is. We also encourage you to sign up for MyChart if you have not already done so. You will need a smartphone if possible. For patients that do not have this, we can still complete the visit using a regular telephone but do prefer a smartphone to enable video when possible. You may have a family member that lives with you that can help. If possible, we also ask that you have a blood pressure cuff and scale at home to measure your blood pressure, heart rate and weight prior to your scheduled appointment. Patients with clinical needs that need an in-person evaluation and testing will still be able to come to the office if absolutely necessary. If you have any questions, feel free to call our office.     YOUR PROVIDER WILL BE USING THE FOLLOWING PLATFORM TO COMPLETE YOUR VISIT: Doximity  . IF USING MYCHART - How to Download the MyChart App to Your SmartPhone   - If Apple, go to App Store and type in MyChart in the search bar and download the app. If Android, ask patient to go to Google Play Store and type in MyChart in the search bar and download the app. The app is free but as with any other app downloads, your phone may require you to verify saved payment information or Apple/Android password.  - You will need to then log into the app with your MyChart username and password, and select [facility] as your healthcare provider to link the account.  - When it is time for your visit, go to the MyChart app, find appointments, and click Begin Video Visit. Be sure to Select Allow for your device to  access the Microphone and Camera for your visit. You will then be connected, and your provider will be with you shortly.  **If you have any issues connecting or need assistance, please contact MyChart service desk [phone] ([phone])**  **If using a computer, in order to ensure the best quality for your visit, you will need to use either of the following Internet Browsers: Google Chrome or Microsoft Edge**  . IF USING DOXIMITY or [email or web address].ME - The staff will give you instructions on receiving your link to join the meeting the day of your visit.      2-3 DAYS BEFORE YOUR APPOINTMENT  You will receive a telephone call from one of our [facility] team members - your caller ID may say "Unknown caller." If this is a video visit, we will walk you through how to get the video launched on your phone. We will remind you check your blood pressure, heart rate and weight prior to your scheduled appointment. If you have an Apple Watch or Kardia, please upload any pertinent ECG strips the day before or morning of your appointment to MyChart. Our staff will also make sure you have reviewed the consent and agree to move forward with your scheduled tele-health visit.     THE DAY OF YOUR APPOINTMENT  Approximately 15 minutes prior to your scheduled appointment, you will receive a telephone call from one of [facility] team - your caller ID may say "Unknown caller."    Our staff will confirm medications, vital signs for the day and any symptoms you may be experiencing. Please have this information available prior to the time of visit start. It may also be helpful for you to have a pad of paper and pen handy for any instructions given during your visit. They will also walk you through joining the smartphone meeting if this is a video visit.    CONSENT FOR TELE-HEALTH VISIT - PLEASE REVIEW  I hereby voluntarily request, consent and authorize [facility] and its employed or contracted physicians, physician  assistants, nurse practitioners or other licensed health care professionals (the Practitioner), to provide me with telemedicine health care services (the "Services") as deemed necessary by the treating Practitioner. I acknowledge and consent to receive the Services by the Practitioner via telemedicine. I understand that the telemedicine visit will involve communicating with the Practitioner through live audiovisual communication technology and the disclosure of certain medical information by electronic transmission. I acknowledge that I have been given the opportunity to request an in-person assessment or other available alternative prior to the telemedicine visit and am voluntarily participating in the telemedicine visit.  I understand that I have the right to withhold or withdraw my consent to the use of telemedicine in the course of my care at any time, without affecting my right to future care or treatment, and that the Practitioner or I may terminate the telemedicine visit at any time. I understand that I have the right to inspect all information obtained and/or recorded in the course of the telemedicine visit and may receive copies of available information for a reasonable fee.  I understand that some of the potential risks of receiving the Services via telemedicine include:  . Delay or interruption in medical evaluation due to technological equipment failure or disruption; . Information transmitted may not be sufficient (e.g. poor resolution of images) to allow for appropriate medical decision making by the Practitioner; and/or  . In rare instances, security protocols could fail, causing a breach of personal health information.  Furthermore, I acknowledge that it is my responsibility to provide information about my medical history, conditions and care that is complete and accurate to the best of my ability. I acknowledge that Practitioner's advice, recommendations, and/or decision may be based on  factors not within their control, such as incomplete or inaccurate data provided by me or distortions of diagnostic images or specimens that may result from electronic transmissions. I understand that the practice of medicine is not an exact science and that Practitioner makes no warranties or guarantees regarding treatment outcomes. I acknowledge that I will receive a copy of this consent concurrently upon execution via email to the email address I last provided but may also request a printed copy by calling the office of [facility].    I understand that my insurance will be billed for this visit.   I have read or had this consent read to me. . I understand the contents of this consent, which adequately explains the benefits and risks of the Services being provided via telemedicine.  . I have been provided ample opportunity to ask questions regarding this consent and the Services and have had my questions answered to my satisfaction. . I give my informed consent for the services to be provided through the use of telemedicine in my medical care  By participating in this telemedicine visit I agree to the above.  

## 2018-10-26 ENCOUNTER — Encounter: Payer: Self-pay | Admitting: Interventional Cardiology

## 2018-10-26 ENCOUNTER — Telehealth (INDEPENDENT_AMBULATORY_CARE_PROVIDER_SITE_OTHER): Payer: PPO | Admitting: Interventional Cardiology

## 2018-10-26 ENCOUNTER — Other Ambulatory Visit: Payer: Self-pay

## 2018-10-26 VITALS — Ht 75.5 in | Wt 235.0 lb

## 2018-10-26 DIAGNOSIS — E785 Hyperlipidemia, unspecified: Secondary | ICD-10-CM

## 2018-10-26 DIAGNOSIS — I712 Thoracic aortic aneurysm, without rupture: Secondary | ICD-10-CM

## 2018-10-26 DIAGNOSIS — I25119 Atherosclerotic heart disease of native coronary artery with unspecified angina pectoris: Secondary | ICD-10-CM

## 2018-10-26 DIAGNOSIS — I7121 Aneurysm of the ascending aorta, without rupture: Secondary | ICD-10-CM

## 2018-10-26 DIAGNOSIS — E119 Type 2 diabetes mellitus without complications: Secondary | ICD-10-CM

## 2018-10-26 DIAGNOSIS — Z7189 Other specified counseling: Secondary | ICD-10-CM

## 2018-10-26 NOTE — Progress Notes (Signed)
Virtual Visit via Video Note   This visit type was conducted due to national recommendations for restrictions regarding the COVID-19 Pandemic (e.g. social distancing) in an effort to limit this patient's exposure and mitigate transmission in our community.  Due to his co-morbid illnesses, this patient is at least at moderate risk for complications without adequate follow up.  This format is felt to be most appropriate for this patient at this time.  All issues noted in this document were discussed and addressed.  A limited physical exam was performed with this format.  Please refer to the patient's chart for his consent to telehealth for [facility].   Date:  10/26/2018   ID:  Nicholas Rasmussen, DOB 09-28-1945, MRN [id number]  Patient Location: Home Provider Location: Home  PCP:  Flossie Buffy, NP  Cardiologist:  Sinclair Grooms, MD  Electrophysiologist:  None   Evaluation Performed:  Follow-Up Visit  Chief Complaint:  Aortic aneurysm/ Hypertension  History of Present Illness:    Nicholas Rasmussen is a 72 y.o. male with vague chest discomfort and moderate risk coronary artery calcium score 156. Coincidentally found ascending aortic aneurysm, 44 mm.  He is doing well and denies angina. He has no angina, palpitations, syncope.  Advocates compliance with medication regimen. Since the restrictions imposed by the Covid pandemic, he has not been active. He denies orthopnea and PND.  The patient does not have symptoms concerning for COVID-19 infection (fever, chills, cough, or new shortness of breath).    Past Medical History:  Diagnosis Date  . Allergic rhinitis 05/07/2014  . Dizziness 01/02/2016  . GERD (gastroesophageal reflux disease)   . Headache 03/13/2015  . Left flank pain 01/02/2016  . Low back pain 06/06/2015  . Pain of left side of body 08/13/2015  . Post herpetic neuralgia   . Routine general medical examination at a health care facility 05/07/2014  . Seizure disorder  ([place]) 05/07/2014  . Seizures ([place])    Last seizure in the 70s.  . Shingles   . Stye 05/07/2014   Past Surgical History:  Procedure Laterality Date  . COLONOSCOPY    . KNEE SURGERY Left    left miniscus  . UPPER GASTROINTESTINAL ENDOSCOPY    . uvala surgery       Current Meds  Medication Sig  . acetaminophen (TYLENOL) 500 MG tablet Take 1 tablet (500 mg total) by mouth every 8 (eight) hours as needed.  Marland Kitchen atorvastatin (LIPITOR) 20 MG tablet Take 1 tablet (20 mg total) by mouth daily at 6 PM. Please schedule an office visit for further refills. 1st attempt  . divalproex (DEPAKOTE ER) 500 MG 24 hr tablet Take 1 tablet (500 mg total) by mouth at bedtime.  . gabapentin (NEURONTIN) 300 MG capsule Take 1 capsule (300 mg total) by mouth at bedtime as needed.  . metFORMIN (GLUCOPHAGE-XR) 500 MG 24 hr tablet Take 1 tablet (500 mg total) by mouth daily with breakfast.  . metoprolol succinate (TOPROL-XL) 25 MG 24 hr tablet Take 1 tablet (25 mg total) by mouth daily. Please keep upcoming appt with Dr. Tamala Julian in June for future refills. Thank you  . vitamin C (ASCORBIC ACID) 500 MG tablet Take 500 mg by mouth daily.     Allergies:   Patient has no known allergies.   Social History   Tobacco Use  . Smoking status: Former Research scientist (life sciences)  . Smokeless tobacco: Never Used  Substance Use Topics  . Alcohol use: No    Alcohol/week:  0.0 standard drinks  . Drug use: No     Family Hx: The patient's family history includes Diabetes in his father; Hypertension in his father; Other in his mother.  ROS:   Please see the history of present illness.    None All other systems reviewed and are negative.   Prior CV studies:   The following studies were reviewed today:  ECHOCARDIOGRAM 2020: IMPRESSIONS    1. The left ventricle has hyperdynamic systolic function, with an ejection fraction of >65%. The cavity size was normal. There is moderately increased left ventricular wall thickness. Left ventricular  diastolic Doppler parameters are consistent with  impaired relaxation. Indeterminate filling pressures.  2. The right ventricle has normal systolic function. The cavity was normal. There is no increase in right ventricular wall thickness.  3. No evidence of mitral valve stenosis.  Labs/Other Tests and Data Reviewed:    EKG:  No ECG reviewed.  Recent Labs: 10/10/2018: ALT 4; BUN 13; Creatinine, Ser 1.01; Potassium 4.1; Sodium 140   Recent Lipid Panel Lab Results  Component Value Date/Time   CHOL 121 10/10/2018 09:27 AM   CHOL 123 08/04/2017 09:04 AM   TRIG 53.0 10/10/2018 09:27 AM   HDL 44.10 10/10/2018 09:27 AM   HDL 47 08/04/2017 09:04 AM   CHOLHDL 3 10/10/2018 09:27 AM   LDLCALC 66 10/10/2018 09:27 AM   LDLCALC 65 08/04/2017 09:04 AM    Wt Readings from Last 3 Encounters:  10/26/18 235 lb (106.6 kg)  09/19/18 235 lb (106.6 kg)  05/29/18 241 lb 12.8 oz (109.7 kg)     Objective:    Vital Signs:  Ht 6' 3.5" (1.918 m)   Wt 235 lb (106.6 kg)   BMI 28.99 kg/m    VITAL SIGNS:  reviewed GEN:  no acute distress RESPIRATORY:  normal respiratory effort, symmetric expansion CARDIOVASCULAR:  no peripheral edema NEURO:  alert and oriented x 3, no obvious focal deficit  ASSESSMENT & PLAN:    1. Coronary artery disease involving native coronary artery of native heart with angina pectoris ([place])   2. Ascending aortic aneurysm ([place])   3. Hyperlipidemia with target LDL less than 70   4. Type 2 diabetes mellitus without complication, without long-term current use of insulin ([place])   5. Educated About Covid-19 Virus Infection    PLAN:  1. Primary prevention reviewed. See below. 2. Continue beta blocker and ARB. 3. LDL < 70. HgbA1C < 7. Most recent was mid 63's.Overall education and awareness concerning primary/secondary risk prevention was discussed in detail: LDL less than 70, hemoglobin A1c less than 7, blood pressure target less than 130/80 mmHg, >150 minutes of moderate aerobic  activity per week, avoidance of smoking, weight control (via diet and exercise), and continued surveillance/management of/for obstructive sleep apnea. 4.   COVID-19 Education: The signs and symptoms of COVID-19 were discussed with the patient and how to seek care for testing (follow up with PCP or arrange E-visit).  The importance of social distancing was discussed today.  Time:   Today, I have spent 10 minutes with the patient with telehealth technology discussing the above problems.     Medication Adjustments/Labs and Tests Ordered: Current medicines are reviewed at length with the patient today.  Concerns regarding medicines are outlined above.   Tests Ordered: No orders of the defined types were placed in this encounter.   Medication Changes: No orders of the defined types were placed in this encounter.   Disposition:  Follow up in 1  year(s)  Signed, Sinclair Grooms, MD  10/26/2018 3:51 PM    [facility]

## 2018-10-26 NOTE — Patient Instructions (Signed)

## 2018-10-30 ENCOUNTER — Telehealth: Payer: PPO | Admitting: Interventional Cardiology

## 2018-11-14 ENCOUNTER — Other Ambulatory Visit: Payer: Self-pay | Admitting: *Deleted

## 2018-11-14 ENCOUNTER — Other Ambulatory Visit: Payer: Self-pay | Admitting: Neurology

## 2018-11-14 MED ORDER — GABAPENTIN 300 MG PO CAPS: 300.0000 mg | ORAL_CAPSULE | Freq: Every evening | ORAL | 0 refills | Status: DC | PRN

## 2018-11-16 ENCOUNTER — Telehealth: Payer: Self-pay | Admitting: Neurology

## 2018-11-16 NOTE — Telephone Encounter (Signed)
Pt called in and stated he has been experiencing some chest discomfort and neck pain and chills since he changed the dosage of the divalproex (DEPAKOTE ER) 500 MG 24 hr tablet, advised pt to speak with his PCP about his chest discomfort, pt is stating he would like a call back to make sure it isn't a side effect.

## 2018-11-16 NOTE — Telephone Encounter (Signed)
I have spoken with the patient.  Says he has been on Depakote since the late 1970's.  The only thing that changed was going from Depakote DR 250 BID to Depakote ER 500mg  [facility] and this transition was years ago.  He is currently experiencing mild chest discomfort, neck pain and chills.  States he has contacted his PCP today and will go in for further evaluation of these symptoms.  He is aware if his chest pain worsens and causes concern for his safety, he should proceed to the ED.

## 2018-11-27 ENCOUNTER — Other Ambulatory Visit: Payer: Self-pay | Admitting: Interventional Cardiology

## 2018-11-27 ENCOUNTER — Telehealth: Payer: Self-pay | Admitting: Interventional Cardiology

## 2018-11-27 NOTE — Telephone Encounter (Signed)
 *  STAT* If patient is at the pharmacy, call can be transferred to refill team.   1. Which medications need to be refilled? (please list name of each medication and dose if known)   atorvastatin (LIPITOR) 20 MG tablet metoprolol succinate (TOPROL-XL) 25 MG 24 hr tablet  2. Which pharmacy/location (including street and city if local pharmacy) is medication to be sent to? [facility] [address]  3. Do they need a 30 day or 90 day supply? [place]

## 2018-11-29 ENCOUNTER — Ambulatory Visit: Payer: Self-pay

## 2018-11-29 DIAGNOSIS — Z1159 Encounter for screening for other viral diseases: Secondary | ICD-10-CM | POA: Diagnosis not present

## 2018-11-29 NOTE — Telephone Encounter (Signed)
Patient called and says he was around his brother in law on Saturday for about 15 minutes total and he just found out today that he tested positive for covid. He says he came to his home on Saturday and sat in a chair about 12 feet apart visiting. He says he doesn't have any symptoms and asked about being tested. I called the office and spoke to [place], [facility] who asked to speak to the patient, the call was connected successfully.  Answer Assessment - Initial Assessment Questions 1. CLOSE CONTACT: "Who is the person with the confirmed or suspected COVID-19 infection that you were exposed to?"     Brother-in-law tested positive 2. PLACE of CONTACT: "Where were you when you were exposed to COVID-19?" (e.g., home, school, medical waiting room; which city?)     [place], [place] 3. TYPE of CONTACT: "How much contact was there?" (e.g., sitting next to, live in same house, work in same office, same building)      Visited in my home 4. DURATION of CONTACT: "How long were you in contact with the COVID-19 patient?" (e.g., a few seconds, passed by person, a few minutes, live with the patient)     About 10-15 minutes total 5. DATE of CONTACT: "When did you have contact with a COVID-19 patient?" (e.g., how many days ago)     11/25/18 6. TRAVEL: "Have you traveled out of the country recently?" If so, "When and where?"     * Also ask about out-of-state travel, since the CDC has identified some high-risk cities for community spread in the [place].     * Note: Travel becomes less relevant if there is widespread community transmission where the patient lives.     No 7. COMMUNITY SPREAD: "Are there lots of cases of COVID-19 (community spread) where you live?" (See public health department website, if unsure)       No 8. SYMPTOMS: "Do you have any symptoms?" (e.g., fever, cough, breathing difficulty)     No 9. PREGNANCY OR POSTPARTUM: "Is there any chance you are pregnant?" "When was your last menstrual period?" "Did you  deliver in the last 2 weeks?"     N/A 10. HIGH RISK: "Do you have any heart or lung problems? Do you have a weak immune system?" (e.g., CHF, COPD, asthma, HIV positive, chemotherapy, renal failure, diabetes mellitus, sickle cell anemia)      No  Protocols used: CORONAVIRUS (COVID-19) EXPOSURE-A-AH

## 2018-11-30 ENCOUNTER — Other Ambulatory Visit: Payer: Self-pay

## 2018-11-30 ENCOUNTER — Encounter: Payer: Self-pay | Admitting: Nurse Practitioner

## 2018-11-30 ENCOUNTER — Ambulatory Visit (INDEPENDENT_AMBULATORY_CARE_PROVIDER_SITE_OTHER): Payer: PPO | Admitting: Nurse Practitioner

## 2018-11-30 VITALS — Ht 75.5 in | Wt 235.0 lb

## 2018-11-30 DIAGNOSIS — Z20828 Contact with and (suspected) exposure to other viral communicable diseases: Secondary | ICD-10-CM

## 2018-11-30 DIAGNOSIS — Z20822 Contact with and (suspected) exposure to covid-19: Secondary | ICD-10-CM

## 2018-11-30 NOTE — Progress Notes (Signed)
Virtual Visit via Video Note  I connected with Nicholas Rasmussen on 11/30/18 at  9:00 AM EDT by a video enabled telemedicine application and verified that I am speaking with the correct person using two identifiers.  Location: Patient: Home Provider: Office   I discussed the limitations of evaluation and management by telemedicine and the availability of in person appointments. The patient expressed understanding and agreed to proceed.  CC: questions about possible COVID-19 exposure by brother-in-law over holiday weekend.  History of Present Illness: Nicholas Rasmussen has questions about possible exposure to COVID-19 by brother in law who was asymptomatic. He was notified about brother in [place] results yesterday. He states they said about 38ft away from each other and had facial mask during their interaction. Had COVID-19 test completed by [facility] urgent care clinic yesterday. He will like to know if use of zinc supplement will provide any immunity protection.   Review of Systems  Constitutional: Negative.   HENT: Negative.   Respiratory: Negative.   Cardiovascular: Negative.   Gastrointestinal: Negative.   Neurological: Negative for dizziness and headaches.   Observations/Objective: Physical Exam  Constitutional: He is oriented to person, place, and time. He appears well-developed.  Pulmonary/Chest: [place]ort normal.  Neurological: He is alert and oriented to person, place, and time.  Psychiatric: He has a normal mood and affect. His behavior is normal. Thought content normal.   Assessment and Plan: Nicholas Rasmussen was seen today for follow-up.  Diagnoses and all orders for this visit:  Exposure to Covid-19 Virus   All of today's visit was spent providing education on COVID-19 and use of zinc supplement. I spent 48mins, providing information about benefits of facial covering, hand washing , use of ETOH sanitizer, and social distancing.  Follow Up Instructions: Provided education about risk  of exposure being minimized with use of facial mask and gloves, hand washing and/or ETOH sanitizer, maintaining 45ft distance and close proximity for <83mins. Also informed him about lack of evidence to support use of zinc supplement to build immunity against COVID-19. He verbalized understanding and stated all questions were answered.   I discussed the assessment and treatment plan with the patient. The patient was provided an opportunity to ask questions and all were answered. The patient agreed with the plan and demonstrated an understanding of the instructions.   The patient was advised to call back or seek an in-person evaluation if the symptoms worsen or if the condition fails to improve as anticipated.  I provided 20 minutes of non-face-to-face time during this encounter.  [name], NP

## 2018-11-30 NOTE — Patient Instructions (Addendum)
Provided education about risk of exposure being minimized with use of facial mask and gloves, hand washing and/or ETOH sanitizer, maintaining 57ft distance and close proximity for <77mins. Also informed him about lack of evidence to support use of zinc supplement to build immunity against COVID-19.  COVID-19 Frequently Asked Questions COVID-19 (coronavirus disease) is an infection that is caused by a large family of viruses. Some viruses cause illness in people and others cause illness in animals like camels, cats, and bats. In some cases, the viruses that cause illness in animals can spread to humans. Where did the coronavirus come from? In December 2019, [place] told the [facility]) of several cases of lung disease (human respiratory illness). These cases were linked to an open seafood and livestock market in the city of [place]. The link to the seafood and livestock market suggests that the virus may have spread from animals to humans. However, since that first outbreak in December, the virus has also been shown to spread from person to person. What is the name of the disease and the virus? Disease name Early on, this disease was called novel coronavirus. This is because scientists determined that the disease was caused by a new (novel) respiratory virus. The World Health Organization [facility]) has now named the disease COVID-19, or coronavirus disease. Virus name The virus that causes the disease is called severe acute respiratory syndrome coronavirus 2 (SARS-CoV-2). More information on disease and virus naming World Health Organization [facility]): www.who.int/emergencies/diseases/novel-coronavirus-2019/technical-guidance/naming-the-coronavirus-disease-(covid-2019)-and-the-virus-that-causes-it Who is at risk for complications from coronavirus disease? Some people may be at higher risk for complications from coronavirus disease. This includes older adults and people who have chronic diseases,  such as heart disease, diabetes, and lung disease. If you are at higher risk for complications, take these extra precautions:  Avoid close contact with people who are sick or have a fever or cough. Stay at least 3-6 ft (1-2 m) away from them, if possible.  Wash your hands often with soap and water for at least 20 seconds.  Avoid touching your face, mouth, nose, or eyes.  Keep supplies on hand at home, such as food, medicine, and cleaning supplies.  Stay home as much as possible.  Avoid social gatherings and travel. How does coronavirus disease spread? The virus that causes coronavirus disease spreads easily from person to person (is contagious). There are also cases of community-spread disease. This means the disease has spread to:  People who have no known contact with other infected people.  People who have not traveled to areas where there are known cases. It appears to spread from one person to another through droplets from coughing or sneezing. Can I get the virus from touching surfaces or objects? There is still a lot that we do not know about the virus that causes coronavirus disease. Scientists are basing a lot of information on what they know about similar viruses, such as:  Viruses cannot generally survive on surfaces for long. They need a human body (host) to survive.  It is more likely that the virus is spread by close contact with people who are sick (direct contact), such as through: ? Shaking hands or hugging. ? Breathing in respiratory droplets that travel through the air. This can happen when an infected person coughs or sneezes on or near other people.  It is less likely that the virus is spread when a person touches a surface or object that has the virus on it (indirect contact). The virus may be able  to enter the body if the person touches a surface or object and then touches his or her face, eyes, nose, or mouth. Can a person spread the virus without having  symptoms of the disease? It may be possible for the virus to spread before a person has symptoms of the disease, but this is most likely not the main way the virus is spreading. It is more likely for the virus to spread by being in close contact with people who are sick and breathing in the respiratory droplets of a sick person's cough or sneeze. What are the symptoms of coronavirus disease? Symptoms vary from person to person and can range from mild to severe. Symptoms may include:  Fever.  Cough.  Tiredness, weakness, or fatigue.  Fast breathing or feeling short of breath. These symptoms can appear anywhere from 2 to 14 days after you have been exposed to the virus. If you develop symptoms, call your health care provider. People with severe symptoms may need hospital care. If I am exposed to the virus, how long does it take before symptoms start? Symptoms of coronavirus disease may appear anywhere from 2 to 14 days after a person has been exposed to the virus. If you develop symptoms, call your health care provider. Should I be tested for this virus? Your health care provider will decide whether to test you based on your symptoms, history of exposure, and your risk factors. How does a health care provider test for this virus? Health care providers will collect samples to send for testing. Samples may include:  Taking a swab of fluid from the nose.  Taking fluid from the lungs by having you cough up mucus (sputum) into a sterile cup.  Taking a blood sample.  Taking a stool or urine sample. Is there a treatment or vaccine for this virus? Currently, there is no vaccine to prevent coronavirus disease. Also, there are no medicines like antibiotics or antivirals to treat the virus. A person who becomes sick is given supportive care, which means rest and fluids. A person may also relieve his or her symptoms by using over-the-counter medicines that treat sneezing, coughing, and runny nose.  These are the same medicines that a person takes for the common cold. If you develop symptoms, call your health care provider. People with severe symptoms may need hospital care. What can I do to protect myself and my family from this virus?     You can protect yourself and your family by taking the same actions that you would take to prevent the spread of other viruses. Take the following actions:  Wash your hands often with soap and water for at least 20 seconds. If soap and water are not available, use alcohol-based hand sanitizer.  Avoid touching your face, mouth, nose, or eyes.  Cough or sneeze into a tissue, sleeve, or elbow. Do not cough or sneeze into your hand or the air. ? If you cough or sneeze into a tissue, throw it away immediately and wash your hands.  Disinfect objects and surfaces that you frequently touch every day.  Avoid close contact with people who are sick or have a fever or cough. Stay at least 3-6 ft (1-2 m) away from them, if possible.  Stay home if you are sick, except to get medical care. Call your health care provider before you get medical care.  Make sure your vaccines are up to date. Ask your health care provider what vaccines you need. What should I  do if I need to travel? Follow travel recommendations from your local health authority, the CDC, and WHO. Travel information and advice  Centers for Disease Control and Prevention (CDC): BodyEditor.hu  World Health Organization [facility]): ThirdIncome.ca Know the risks and take action to protect your health  You are at higher risk of getting coronavirus disease if you are traveling to areas with an outbreak or if you are exposed to travelers from areas with an outbreak.  Wash your hands often and practice good hygiene to lower the risk of catching or spreading the virus. What should I do if I am sick? General  instructions to stop the spread of infection  Wash your hands often with soap and water for at least 20 seconds. If soap and water are not available, use alcohol-based hand sanitizer.  Cough or sneeze into a tissue, sleeve, or elbow. Do not cough or sneeze into your hand or the air.  If you cough or sneeze into a tissue, throw it away immediately and wash your hands.  Stay home unless you must get medical care. Call your health care provider or local health authority before you get medical care.  Avoid public areas. Do not take public transportation, if possible.  If you can, wear a mask if you must go out of the house or if you are in close contact with someone who is not sick. Keep your home clean  Disinfect objects and surfaces that are frequently touched every day. This may include: ? Counters and tables. ? Doorknobs and light switches. ? Sinks and faucets. ? Electronics such as phones, remote controls, keyboards, computers, and tablets.  Wash dishes in hot, soapy water or use a dishwasher. Air-dry your dishes.  Wash laundry in hot water. Prevent infecting other household members  Let healthy household members care for children and pets, if possible. If you have to care for children or pets, wash your hands often and wear a mask.  Sleep in a different bedroom or bed, if possible.  Do not share personal items, such as razors, toothbrushes, deodorant, combs, brushes, towels, and washcloths. Where to find more information Centers for Disease Control and Prevention (CDC)  Information and news updates: https://www.butler-gonzalez.com/ World Health Organization [facility])  Information and news updates: MissExecutive.com.ee  Coronavirus health topic: https://www.castaneda.info/  Questions and answers on COVID-19: OpportunityDebt.at  Global tracker: who.sprinklr.com American Academy of Pediatrics  (AAP)  Information for families: www.healthychildren.org/English/health-issues/conditions/chest-lungs/Pages/2019-Novel-Coronavirus.aspx The coronavirus situation is changing rapidly. Check your local health authority website or the CDC and [facility] websites for updates and news. When should I contact a health care provider?  Contact your health care provider if you have symptoms of an infection, such as fever or cough, and you: ? Have been near anyone who is known to have coronavirus disease. ? Have come into contact with a person who is suspected to have coronavirus disease. ? Have traveled outside of the country. When should I get emergency medical care?  Get help right away by calling your local emergency services (911 in the [place] if you have: ? Trouble breathing. ? Pain or pressure in your chest. ? Confusion. ? Blue-tinged lips and fingernails. ? Difficulty waking from sleep. ? Symptoms that get worse. Let the emergency medical personnel know if you think you have coronavirus disease. Summary  A new respiratory virus is spreading from person to person and causing COVID-19 (coronavirus disease).  The virus that causes COVID-19 appears to spread easily. It spreads from one person to another through  droplets from coughing or sneezing.  Older adults and those with chronic diseases are at higher risk of disease. If you are at higher risk for complications, take extra precautions.  There is currently no vaccine to prevent coronavirus disease. There are no medicines, such as antibiotics or antivirals, to treat the virus.  You can protect yourself and your family by washing your hands often, avoiding touching your face, and covering your coughs and sneezes. This information is not intended to replace advice given to you by your health care provider. Make sure you discuss any questions you have with your health care provider. Document Released: 09/05/2018 Document Revised: 09/05/2018 Document  Reviewed: 09/05/2018 Elsevier Patient Education  [place].

## 2019-01-01 ENCOUNTER — Telehealth: Payer: Self-pay | Admitting: Nurse Practitioner

## 2019-01-01 NOTE — Telephone Encounter (Signed)

## 2019-01-02 ENCOUNTER — Encounter: Payer: Self-pay | Admitting: Nurse Practitioner

## 2019-01-02 ENCOUNTER — Ambulatory Visit (INDEPENDENT_AMBULATORY_CARE_PROVIDER_SITE_OTHER): Payer: PPO | Admitting: Nurse Practitioner

## 2019-01-02 ENCOUNTER — Other Ambulatory Visit: Payer: Self-pay

## 2019-01-02 VITALS — BP 120/62 | HR 51 | Temp 98.0°F | Ht 75.5 in | Wt 233.2 lb

## 2019-01-02 DIAGNOSIS — R109 Unspecified abdominal pain: Secondary | ICD-10-CM | POA: Diagnosis not present

## 2019-01-02 DIAGNOSIS — L2489 Irritant contact dermatitis due to other agents: Secondary | ICD-10-CM

## 2019-01-02 MED ORDER — TRIAMCINOLONE ACETONIDE 0.1 % EX CREA: 1.0000 "application " | TOPICAL_CREAM | Freq: Two times a day (BID) | CUTANEOUS | 1 refills | Status: DC

## 2019-01-02 NOTE — Patient Instructions (Signed)
Go to lab for urine collection.

## 2019-01-02 NOTE — Progress Notes (Signed)
Subjective:  Patient ID: Nicholas Rasmussen, male    DOB: [birth year]  Age: 73 y.o. MRN: [id number]  CC: Follow-up (follow up on DM--left back/side painful/ going on for long time/spots on right hand--breakout--itchy/)  Flank Pain This is a chronic problem. The current episode started more than 1 year ago. The problem occurs intermittently. Progression since onset: worse in last 1week. The pain is present in the lumbar spine. The quality of the pain is described as aching and cramping. The pain does not radiate. The pain is the same all the time. Exacerbated by: nothing in particular. Stiffness is present: none. Pertinent negatives include no abdominal pain, bladder incontinence, bowel incontinence, chest pain, dysuria, fever, headaches, leg pain, numbness, paresis, paresthesias, pelvic pain, perianal numbness, tingling, weakness or weight loss. Risk factors include sedentary lifestyle. He has tried nothing for the symptoms.  Rash This is a new problem. The current episode started in the past 7 days. The problem is unchanged. The affected locations include the left hand and right hand. The rash is characterized by itchiness and scaling. Associated with: use of different brand of gloves. Pertinent negatives include no facial edema, fever, joint pain or nail changes. Past treatments include nothing.  has intermittent belching and flatulence, left flank pain Relieved after BM (no melena, no hematochezia, no rectal pain, no weight loss)  Reviewed past Medical, Social and Family history today.  Outpatient Medications Prior to Visit  Medication Sig Dispense Refill  . acetaminophen (TYLENOL) 500 MG tablet Take 1 tablet (500 mg total) by mouth every 8 (eight) hours as needed. 30 tablet 0  . atorvastatin (LIPITOR) 20 MG tablet Take 1 tablet (20 mg total) by mouth daily at 6 PM. 90 tablet 3  . divalproex (DEPAKOTE ER) 500 MG 24 hr tablet Take 1 tablet (500 mg total) by mouth at bedtime. 90 tablet 4  .  gabapentin (NEURONTIN) 300 MG capsule Take 1 capsule (300 mg total) by mouth at bedtime as needed. Please call [phone] to schedule yearly follow up appt. 90 capsule 0  . metFORMIN (GLUCOPHAGE-XR) 500 MG 24 hr tablet Take 1 tablet (500 mg total) by mouth daily with breakfast. 90 tablet 3  . metoprolol succinate (TOPROL-XL) 25 MG 24 hr tablet Take 1 tablet (25 mg total) by mouth daily. 90 tablet 3  . vitamin C (ASCORBIC ACID) 500 MG tablet Take 500 mg by mouth daily.     No facility-administered medications prior to visit.     ROS See HPI  Objective:  BP 120/62   Pulse (!) 51   Temp 98 F (36.7 C) (Tympanic)   Ht 6' 3.5" (1.918 m)   Wt 233 lb 3.2 oz (105.8 kg)   SpO2 98%   BMI 28.76 kg/m   BP Readings from Last 3 Encounters:  01/02/19 120/62  05/29/18 122/78  03/29/18 108/66    Wt Readings from Last 3 Encounters:  01/02/19 233 lb 3.2 oz (105.8 kg)  11/30/18 235 lb (106.6 kg)  10/26/18 235 lb (106.6 kg)    Physical Exam Vitals signs reviewed.  Neck:     Musculoskeletal: Normal range of motion and neck supple.  Cardiovascular:     Rate and Rhythm: Normal rate.     Pulses: Normal pulses.  Pulmonary:     Effort: Pulmonary effort is normal.  Abdominal:     General: Bowel sounds are normal. There is no distension.     Palpations: Abdomen is soft. There is no mass.     Tenderness:  There is no abdominal tenderness. There is no right CVA tenderness, left CVA tenderness or guarding.     Hernia: No hernia is present.  Musculoskeletal: Normal range of motion.     Right lower leg: No edema.     Left lower leg: No edema.  Skin:    General: Skin is warm and dry.     Findings: Rash present.     Comments: Scaly rash on dorsal surface of bilateral hands  Neurological:     Mental Status: He is alert and oriented to person, place, and time.     Lab Results  Component Value Date   WBC 6.9 10/15/2017   HGB 12.4 (L) 10/15/2017   HCT 38.8 (L) 10/15/2017   PLT 202 10/15/2017    GLUCOSE 100 (H) 10/10/2018   CHOL 121 10/10/2018   TRIG 53.0 10/10/2018   HDL 44.10 10/10/2018   LDLCALC 66 10/10/2018   ALT 4 10/10/2018   AST 13 10/10/2018   NA 140 10/10/2018   K 4.1 10/10/2018   CL 103 10/10/2018   CREATININE 1.01 10/10/2018   BUN 13 10/10/2018   CO2 34 (H) 10/10/2018   TSH 1.08 11/12/2014   PSA 0.42 11/17/2016   HGBA1C 6.7 (H) 10/10/2018   MICROALBUR 3.3 (H) 10/10/2018    Dg Chest 2 View  Result Date: 10/15/2017 CLINICAL DATA:  Chest pain EXAM: CHEST - 2 VIEW COMPARISON:  Chest CT 06/22/2017 FINDINGS: The heart size and mediastinal contours are within normal limits. Both lungs are clear. The visualized skeletal structures are unremarkable. IMPRESSION: No active cardiopulmonary disease. Electronically Signed   By: Ulyses Jarred M.D.   On: 10/15/2017 22:26    Assessment & Plan:   Nicholas Rasmussen was seen today for follow-up.  Diagnoses and all orders for this visit:  Acute left flank pain -     Urinalysis w microscopic + reflex cultur  Irritant contact dermatitis due to other agents -     triamcinolone cream (KENALOG) 0.1 %; Apply 1 application topically 2 (two) times daily.   I am having Nicholas Rasmussen start on triamcinolone cream. I am also having him maintain his acetaminophen, metFORMIN, vitamin C, divalproex, gabapentin, metoprolol succinate, and atorvastatin.  Meds ordered this encounter  Medications  . triamcinolone cream (KENALOG) 0.1 %    Sig: Apply 1 application topically 2 (two) times daily.    Dispense:  15 g    Refill:  1    Order Specific Question:   Supervising Provider    Answer:   Lucille Passy [[id number]]    Problem List Items Addressed This Visit    None    Visit Diagnoses    Acute left flank pain    -  Primary   Relevant Orders   Urinalysis w microscopic + reflex cultur   Irritant contact dermatitis due to other agents       Relevant Medications   triamcinolone cream (KENALOG) 0.1 %       Follow-up: Return in about 3 months  (around 04/04/2019) for DM and HTN, hyperlipidemia (fasting).  [name], NP

## 2019-01-03 LAB — URINALYSIS W MICROSCOPIC + REFLEX CULTURE
Bacteria, UA: NONE SEEN /HPF
Bilirubin Urine: NEGATIVE
Glucose, UA: NEGATIVE
Hgb urine dipstick: NEGATIVE
Hyaline Cast: NONE SEEN /LPF
Leukocyte Esterase: NEGATIVE
Nitrites, Initial: NEGATIVE
Protein, ur: NEGATIVE
RBC / HPF: NONE SEEN /HPF (ref 0–2)
Specific Gravity, Urine: 1.021 (ref 1.001–1.03)
Squamous Epithelial / HPF: NONE SEEN /HPF (ref <=5)
WBC, UA: NONE SEEN /HPF (ref 0–5)
pH: 8 (ref 5.0–8.0)

## 2019-01-03 LAB — NO CULTURE INDICATED

## 2019-01-22 ENCOUNTER — Telehealth: Payer: Self-pay | Admitting: Nurse Practitioner

## 2019-01-22 DIAGNOSIS — N281 Cyst of kidney, acquired: Secondary | ICD-10-CM

## 2019-01-22 DIAGNOSIS — E1142 Type 2 diabetes mellitus with diabetic polyneuropathy: Secondary | ICD-10-CM

## 2019-01-22 DIAGNOSIS — G8929 Other chronic pain: Secondary | ICD-10-CM

## 2019-01-22 NOTE — Telephone Encounter (Signed)
LVM for the pt to call back. Need to get more information why he is unable to take metformin.

## 2019-01-22 NOTE — Telephone Encounter (Signed)
Patient called and would like to talk to [facility] regarding his medication metformin and would like to know if there is something else he can take other then that medication. Please call patient back, thanks.

## 2019-01-23 MED ORDER — GLIPIZIDE ER 5 MG PO TB24: 5.0000 mg | ORAL_TABLET | Freq: Every day | ORAL | 1 refills | Status: DC

## 2019-01-23 NOTE — Telephone Encounter (Signed)
Ordered repeat CT ABD to re eval left renal cyst found on previous CT ABD

## 2019-01-23 NOTE — Addendum Note (Signed)
Addended by: [name] L on: 01/23/2019 02:44 PM   Modules accepted: Orders

## 2019-01-23 NOTE — Telephone Encounter (Signed)
Metformin discontinue per patient's request. Glipizide sent in place.  Last OV, we discussed left flank and LUQ abdominal pain. I did not evaluate him for left hip pain. If this is new, please schedule virtual appt

## 2019-01-23 NOTE — Telephone Encounter (Signed)
Correction--last ov left flank pain is not better, still has some pain from last ov.

## 2019-01-23 NOTE — Telephone Encounter (Signed)
Pt was wondering if Nicholas Rasmussen can change metformin to somethinging else, pt is concern about side effects of it.   Pt also mention that he still has hip pain, he was wondering what else to do about this?   Please advise.

## 2019-01-24 ENCOUNTER — Other Ambulatory Visit: Payer: Self-pay | Admitting: Nurse Practitioner

## 2019-01-24 DIAGNOSIS — R739 Hyperglycemia, unspecified: Secondary | ICD-10-CM

## 2019-01-24 DIAGNOSIS — I25119 Atherosclerotic heart disease of native coronary artery with unspecified angina pectoris: Secondary | ICD-10-CM

## 2019-02-01 ENCOUNTER — Other Ambulatory Visit: Payer: Self-pay

## 2019-02-01 ENCOUNTER — Ambulatory Visit (INDEPENDENT_AMBULATORY_CARE_PROVIDER_SITE_OTHER): Payer: PPO

## 2019-02-01 DIAGNOSIS — Z23 Encounter for immunization: Secondary | ICD-10-CM | POA: Diagnosis not present

## 2019-02-01 NOTE — Progress Notes (Signed)
Pt cam into the office today to get high dose flu shot, gave injection to the left deltoid, pt tolerated injection well, gave information.

## 2019-02-02 ENCOUNTER — Ambulatory Visit (INDEPENDENT_AMBULATORY_CARE_PROVIDER_SITE_OTHER): Payer: PPO | Admitting: Nurse Practitioner

## 2019-02-02 ENCOUNTER — Encounter: Payer: Self-pay | Admitting: Nurse Practitioner

## 2019-02-02 VITALS — BP 120/60 | HR 49 | Temp 97.7°F | Ht 75.5 in | Wt 236.2 lb

## 2019-02-02 DIAGNOSIS — I83813 Varicose veins of bilateral lower extremities with pain: Secondary | ICD-10-CM | POA: Diagnosis not present

## 2019-02-02 NOTE — Patient Instructions (Addendum)
No sign of cellulitis or DVT. Pain due to varicose vein congestion. Use of compression stocking when traveling (driving or flying [id number]). You may also take 81mg  aspirin at that time. You may obtain compression stocking at [facility] in [place], [place], [place], [place] [address] Phone: [phone]  Varicose Veins Varicose veins are veins that have become enlarged, bulged, and twisted. They most often appear in the legs. What are the causes? This condition is caused by damage to the valves in the vein. These valves help blood return to your heart. When they are damaged and they stop working properly, blood may flow backward and back up in the veins near the skin, causing the veins to get larger and appear twisted. The condition can result from any issue that causes blood to back up, like pregnancy, prolonged standing, or obesity. What increases the risk? This condition is more likely to develop in people who are:  On their feet a lot.  Pregnant.  Overweight. What are the signs or symptoms? Symptoms of this condition include:  Bulging, twisted, and bluish veins.  A feeling of heaviness. This may be worse at the end of the day.  Leg pain. This may be worse at the end of the day.  Swelling in the leg.  Changes in skin color over the veins. How is this diagnosed? This condition may be diagnosed based on your symptoms, a physical exam, and an ultrasound test. How is this treated? Treatment for this condition may involve:  Avoiding sitting or standing in one position for long periods of time.  Wearing compression stockings. These stockings help to prevent blood clots and reduce swelling in the legs.  Raising (elevating) the legs when resting.  Losing weight.  Exercising regularly. If you have persistent symptoms or want to improve the way your varicose veins look, you may choose to have a procedure to close the varicose veins off or to remove them. Treatments  to close off the veins include:  Sclerotherapy. In this treatment, a solution is injected into a vein to close it off.  Laser treatment. In this treatment, the vein is heated with a laser to close it off.  Radiofrequency vein ablation. In this treatment, an electrical current produced by radio waves is used to close off the vein. Treatments to remove the veins include:  Phlebectomy. In this treatment, the veins are removed through small incisions made over the veins.  Vein ligation and stripping. In this treatment, incisions are made over the veins. The veins are then removed after being tied (ligated) with stitches (sutures). Follow these instructions at home: Activity  Walk as much as possible. Walking increases blood flow. This helps blood return to the heart and takes pressure off your veins. It also increases your cardiovascular strength.  Follow your health care provider's instructions about exercising.  Do not stand or sit in one position for a long period of time.  Do not sit with your legs crossed.  Rest with your legs raised during the day. General instructions   Follow any diet instructions given to you by your health care provider.  Wear compression stockings as directed by your health care provider. Do not wear other kinds of tight clothing around your legs, pelvis, or waist.  Elevate your legs at night to above the level of your heart.  If you get a cut in the skin over the varicose vein and the vein bleeds: ? Lie down with your leg raised. ? Apply  firm pressure to the cut with a clean cloth until the bleeding stops. ? Place a bandage (dressing) on the cut. Contact a health care provider if:  The skin around your varicose veins starts to break down.  You have pain, redness, tenderness, or hard swelling over a vein.  You are uncomfortable because of pain.  You get a cut in the skin over a varicose vein and it will not stop bleeding. Summary  Varicose veins  are veins that have become enlarged, bulged, and twisted. They most often appear in the legs.  This condition is caused by damage to the valves in the vein. These valves help blood return to your heart.  Treatment for this condition includes frequent movements, wearing compression stockings, losing weight, and exercising regularly. In some cases, procedures are done to close off or remove the veins.  Treatment for this condition may include wearing compression stockings, elevating the legs, losing weight, and engaging in regular activity. In some cases, procedures are done to close off or remove the veins. This information is not intended to replace advice given to you by your health care provider. Make sure you discuss any questions you have with your health care provider. Document Released: 02/17/2005 Document Revised: 07/06/2018 Document Reviewed: 06/02/2016 Elsevier Patient Education  2020 [employer].

## 2019-02-02 NOTE — Progress Notes (Signed)
Subjective:  Patient ID: Nicholas Rasmussen, male    DOB: 17-Mar-1946  Age: 73 y.o. MRN: [id number]  CC: Leg Pain (pt is c/o of legs pain--comes and goes--started 5 days--took a trip/long drive recently)  Leg Pain  The incident occurred 5 to 7 days ago. There was no injury mechanism. The pain is present in the right leg. The quality of the pain is described as aching. The pain has been intermittent since onset. Pertinent negatives include no inability to bear weight, loss of motion, loss of sensation, muscle weakness, numbness or tingling. He reports no foreign bodies present. The symptoms are aggravated by palpation. He has tried nothing for the symptoms.   Reviewed past Medical, Social and Family history today.  Outpatient Medications Prior to Visit  Medication Sig Dispense Refill  . acetaminophen (TYLENOL) 500 MG tablet Take 1 tablet (500 mg total) by mouth every 8 (eight) hours as needed. 30 tablet 0  . atorvastatin (LIPITOR) 20 MG tablet Take 1 tablet (20 mg total) by mouth daily at 6 PM. 90 tablet 3  . divalproex (DEPAKOTE ER) 500 MG 24 hr tablet Take 1 tablet (500 mg total) by mouth at bedtime. 90 tablet 4  . gabapentin (NEURONTIN) 300 MG capsule Take 1 capsule (300 mg total) by mouth at bedtime as needed. Please call [phone] to schedule yearly follow up appt. 90 capsule 0  . glipiZIDE (GLUCOTROL XL) 5 MG 24 hr tablet Take 1 tablet (5 mg total) by mouth daily with breakfast. 90 tablet 1  . metoprolol succinate (TOPROL-XL) 25 MG 24 hr tablet Take 1 tablet (25 mg total) by mouth daily. 90 tablet 3  . triamcinolone cream (KENALOG) 0.1 % Apply 1 application topically 2 (two) times daily. 15 g 1  . vitamin C (ASCORBIC ACID) 500 MG tablet Take 500 mg by mouth daily.     No facility-administered medications prior to visit.    ROS See HPI  Objective:  BP 120/60   Pulse (!) 49   Temp 97.7 F (36.5 C) (Tympanic)   Ht 6' 3.5" (1.918 m)   Wt 236 lb 3.2 oz (107.1 kg)   SpO2 97%   BMI 29.13  kg/m   BP Readings from Last 3 Encounters:  02/02/19 120/60  01/02/19 120/62  05/29/18 122/78    Wt Readings from Last 3 Encounters:  02/02/19 236 lb 3.2 oz (107.1 kg)  01/02/19 233 lb 3.2 oz (105.8 kg)  11/30/18 235 lb (106.6 kg)    Physical Exam Vitals signs reviewed.  Cardiovascular:     Rate and Rhythm: Normal rate.     Pulses: Normal pulses.  Musculoskeletal: Normal range of motion.        General: No swelling or deformity.     Right lower leg: No edema.     Left lower leg: No edema.     Comments: Bilateral LE varicose veins  Skin:    General: Skin is warm and dry.     Findings: No erythema or rash.  Neurological:     Mental Status: He is alert and oriented to person, place, and time.    Lab Results  Component Value Date   WBC 6.9 10/15/2017   HGB 12.4 (L) 10/15/2017   HCT 38.8 (L) 10/15/2017   PLT 202 10/15/2017   GLUCOSE 100 (H) 10/10/2018   CHOL 121 10/10/2018   TRIG 53.0 10/10/2018   HDL 44.10 10/10/2018   LDLCALC 66 10/10/2018   ALT 4 10/10/2018   AST 13 10/10/2018  NA 140 10/10/2018   K 4.1 10/10/2018   CL 103 10/10/2018   CREATININE 1.01 10/10/2018   BUN 13 10/10/2018   CO2 34 (H) 10/10/2018   TSH 1.08 11/12/2014   PSA 0.42 11/17/2016   HGBA1C 6.7 (H) 10/10/2018   MICROALBUR 3.3 (H) 10/10/2018    Dg Chest 2 View  Result Date: 10/15/2017 CLINICAL DATA:  Chest pain EXAM: CHEST - 2 VIEW COMPARISON:  Chest CT 06/22/2017 FINDINGS: The heart size and mediastinal contours are within normal limits. Both lungs are clear. The visualized skeletal structures are unremarkable. IMPRESSION: No active cardiopulmonary disease. Electronically Signed   By: Ulyses Jarred M.D.   On: 10/15/2017 22:26    Assessment & Plan:  See avs for detailed instruction.  Nikhil was seen today for leg pain.  Diagnoses and all orders for this visit:  Varicose veins of both lower extremities with pain -     Compression stockings   I am having [name] maintain his  acetaminophen, vitamin C, divalproex, gabapentin, metoprolol succinate, atorvastatin, triamcinolone cream, and glipiZIDE.  No orders of the defined types were placed in this encounter.   Problem List Items Addressed This Visit      Cardiovascular and Mediastinum   Varicose veins of both lower extremities with pain - Primary   Relevant Orders   Compression stockings       Follow-up: Return if symptoms worsen or fail to improve.  [name], NP

## 2019-02-09 ENCOUNTER — Other Ambulatory Visit: Payer: Self-pay

## 2019-02-09 ENCOUNTER — Ambulatory Visit
Admission: RE | Admit: 2019-02-09 | Discharge: 2019-02-09 | Disposition: A | Discharge status: Home / Self Care | Payer: PPO | Source: Ambulatory Visit | Attending: Nurse Practitioner | Admitting: Nurse Practitioner

## 2019-02-09 DIAGNOSIS — M5136 Other intervertebral disc degeneration, lumbar region: Secondary | ICD-10-CM

## 2019-02-09 DIAGNOSIS — N281 Cyst of kidney, acquired: Secondary | ICD-10-CM

## 2019-02-09 DIAGNOSIS — K769 Liver disease, unspecified: Secondary | ICD-10-CM | POA: Diagnosis not present

## 2019-02-09 DIAGNOSIS — M5137 Other intervertebral disc degeneration, lumbosacral region: Secondary | ICD-10-CM

## 2019-02-09 MED ORDER — IOPAMIDOL (ISOVUE-300) INJECTION 61%
100.0000 mL | Freq: Once | INTRAVENOUS | Status: AC | PRN
  Administered 2019-02-09: 100 mL via INTRAVENOUS

## 2019-02-20 ENCOUNTER — Ambulatory Visit: Payer: PPO | Attending: Nurse Practitioner | Admitting: Physical Therapy

## 2019-02-20 ENCOUNTER — Other Ambulatory Visit: Payer: Self-pay

## 2019-02-20 ENCOUNTER — Encounter: Payer: Self-pay | Admitting: Physical Therapy

## 2019-02-20 DIAGNOSIS — M545 Low back pain, unspecified: Secondary | ICD-10-CM

## 2019-02-20 DIAGNOSIS — M62830 Muscle spasm of back: Secondary | ICD-10-CM | POA: Diagnosis not present

## 2019-02-20 NOTE — Therapy (Signed)
[place] [address] [place], [place], [address] Phone: [phone]   Fax:  [phone]  Physical Therapy Evaluation  Patient Details  Name: Nicholas Rasmussen MRN: [id number] Date of Birth: 1945-08-30 Referring Provider (PT): Nche   Encounter Date: 02/20/2019  PT End of Session - 02/20/19 1055    Visit Number  1    Date for PT Re-Evaluation  04/22/19    PT Start Time  0956    PT Stop Time  1035    PT Time Calculation (min)  39 min    Activity Tolerance  Patient tolerated treatment well    Behavior During Therapy  [facility] for tasks assessed/performed       Past Medical History:  Diagnosis Date  . Allergic rhinitis 05/07/2014  . Dizziness 01/02/2016  . GERD (gastroesophageal reflux disease)   . Headache 03/13/2015  . Left flank pain 01/02/2016  . Low back pain 06/06/2015  . Pain of left side of body 08/13/2015  . Post herpetic neuralgia   . Routine general medical examination at a health care facility 05/07/2014  . Seizure disorder ([place]) 05/07/2014  . Seizures ([place])    Last seizure in the 70s.  . Shingles   . Stye 05/07/2014    Past Surgical History:  Procedure Laterality Date  . COLONOSCOPY    . KNEE SURGERY Left    left miniscus  . UPPER GASTROINTESTINAL ENDOSCOPY    . uvala surgery      There were no vitals filed for this visit.   Subjective Assessment - 02/20/19 1007    Subjective  Patient reports that he has had increasing LBP, x-rays showed DDD.  Reports some increased difficulty with ADL's    Limitations  Lifting;House hold activities    Patient Stated Goals  have less pain    Currently in Pain?  Yes    Pain Score  1     Pain Location  Back    Pain Orientation  Lower    Pain Descriptors / Indicators  Sharp    Pain Type  Acute pain    Pain Radiating Towards  some pain in the right buttock    Pain Onset  More than a month ago    Pain Frequency  Intermittent    Aggravating Factors   lying  down, sitting, pain up to 6/10    Pain Relieving Factors  changing positions, moving pain can be 0/10    Effect of Pain on Daily Activities  difficulty getting comfortable, sleeping         [facility] PT Assessment - 02/20/19 0001      Assessment   Medical Diagnosis  LBP, DDD    Referring Provider (PT)  Nche    Onset Date/Surgical Date  01/20/19    Prior Therapy  no      Precautions   Precautions  None      Balance Screen   Has the patient fallen in the past 6 months  No    Has the patient had a decrease in activity level because of a fear of falling?   No    Is the patient reluctant to leave their home because of a fear of falling?   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 and reports that he has to help lift him up from a chair at times  Prior Function   Level of Independence  Independent    Vocation  Retired    Leisure  no exercise      Mining engineer Comments  fwd head, rounded shoulders , slouched sitting posture      ROM / Strength   AROM / PROM / Strength  AROM;Strength      AROM   Overall AROM Comments  Lumbar ROM decreased 25% with c/o tightness      Strength   Overall Strength Comments  WNL's for the LE's      Flexibility   Soft Tissue Assessment /Muscle Length  yes    Hamstrings  tight    Piriformis  very tight      Palpation   Palpation comment  mild tightness with some tenderness in the lumbar area and into the buttocks                Objective measurements completed on examination: See above findings.              PT Education - 02/20/19 1055    Education Details  Wms flexion    Person(s) Educated  Patient    Methods  Explanation;Demonstration;Handout    Comprehension  Verbalized understanding       PT Short Term Goals - 02/20/19 1202      PT SHORT TERM GOAL #1   Title  independent with initial HEP    Time  2     Period  Weeks    Status  New        PT Long Term Goals - 02/20/19 1202      PT LONG TERM GOAL #1   Title  understand posture and body mechanics    Time  8    Period  Weeks    Status  New      PT LONG TERM GOAL #2   Title  decrease pain 50%    Time  8    Period  Weeks    Status  New      PT LONG TERM GOAL #3   Title  report able to lie down without pain    Time  8    Period  Weeks    Status  New             Plan - 02/20/19 1056    Clinical Impression Statement  Patient has dx of DDD, has pain in the low back worsening over the past year, reports difficulty lying down and getting comofrtable, has good ROM, tight piriformis.  Reports flexion feels better than extnesion    Stability/Clinical Decision Making  Stable/Uncomplicated    Clinical Decision Making  Low    Rehab Potential  Good    PT Frequency  2x / week    PT Duration  8 weeks    PT Treatment/Interventions  ADLs/Self Care Home Management;Electrical Stimulation;Moist Heat;Traction;Ultrasound;Therapeutic activities;Therapeutic exercise;Manual techniques;Patient/family education    PT Next Visit Plan  start core and gym exercises, coud try traction    Consulted and Agree with Plan of Care  Patient       Patient will benefit from skilled therapeutic intervention in order to improve the following deficits and impairments:  Improper body mechanics, Pain, Postural dysfunction, Increased muscle spasms, Decreased range of motion, Decreased strength, Impaired flexibility  Visit Diagnosis: Acute bilateral low back pain without sciatica - Plan: PT plan of care cert/re-cert  Muscle spasm of back - Plan: PT plan of care  cert/re-cert     Problem List Patient Active Problem List   Diagnosis Date Noted  . Varicose veins of both lower extremities with pain 02/02/2019  . Encounter for therapeutic drug level monitoring 09/19/2018  . Type 2 diabetes mellitus with diabetic polyneuropathy, without long-term current use of  insulin ([place]) 03/29/2018  . Diabetic neuropathy ([place]) 03/29/2018  . CAD (coronary artery disease) 08/03/2017  . Post herpetic neuralgia 07/29/2016  . Chronic midline low back pain without sciatica 06/06/2015  . GERD (gastroesophageal reflux disease) 11/15/2014  . Seizure disorder ([place]) 05/07/2014  . Allergic rhinitis 05/07/2014  . Routine general medical examination at a health care facility 05/07/2014    Sumner Boast., PT 02/20/2019, 12:05 PM  [place], [place], [address] Phone: [phone]   Fax:  [phone]  Name: Nicholas Rasmussen MRN: [id number] Date of Birth: 1946-04-10

## 2019-02-27 ENCOUNTER — Ambulatory Visit: Payer: PPO | Attending: Nurse Practitioner | Admitting: Physical Therapy

## 2019-02-27 ENCOUNTER — Encounter: Payer: Self-pay | Admitting: Physical Therapy

## 2019-02-27 ENCOUNTER — Other Ambulatory Visit: Payer: Self-pay

## 2019-02-27 DIAGNOSIS — M62830 Muscle spasm of back: Secondary | ICD-10-CM

## 2019-02-27 DIAGNOSIS — M545 Low back pain, unspecified: Secondary | ICD-10-CM

## 2019-02-27 NOTE — Therapy (Signed)
[place], [place], [address] Phone: [phone]   Fax:  [phone]  Physical Therapy Treatment  Patient Details  Name: Nicholas Rasmussen MRN: [id number] Date of Birth: Oct 13, 1945 Referring Provider (PT): Nche   Encounter Date: 02/27/2019  PT End of Session - 02/27/19 1342    Visit Number  2    Date for PT Re-Evaluation  04/22/19    PT Start Time  1300    PT Stop Time  1343    PT Time Calculation (min)  43 min    Activity Tolerance  Patient tolerated treatment well    Behavior During Therapy  [facility] for tasks assessed/performed       Past Medical History:  Diagnosis Date  . Allergic rhinitis 05/07/2014  . Dizziness 01/02/2016  . GERD (gastroesophageal reflux disease)   . Headache 03/13/2015  . Left flank pain 01/02/2016  . Low back pain 06/06/2015  . Pain of left side of body 08/13/2015  . Post herpetic neuralgia   . Routine general medical examination at a health care facility 05/07/2014  . Seizure disorder ([place]) 05/07/2014  . Seizures ([place])    Last seizure in the 70s.  . Shingles   . Stye 05/07/2014    Past Surgical History:  Procedure Laterality Date  . COLONOSCOPY    . KNEE SURGERY Left    left miniscus  . UPPER GASTROINTESTINAL ENDOSCOPY    . uvala surgery      There were no vitals filed for this visit.  Subjective Assessment - 02/27/19 1302    Subjective  "Doing all right"    Limitations  Lifting;House hold activities    Currently in Pain?  Yes    Pain Score  3     Pain Location  Back    Pain Orientation  Mid;Lower                       [facility] Adult PT Treatment/Exercise - 02/27/19 0001      Exercises   Exercises  Lumbar      Lumbar Exercises: Aerobic   Nustep  L3 x 6 min       Lumbar Exercises: Machines for Strengthening   Cybex Knee Extension  10lb 2x10     Cybex Knee Flexion  25lb 2x10     Leg Press  40lb 3x10       Lumbar Exercises: Standing    Row  Theraband;15 reps;Strengthening   x2   Theraband Level (Row)  Level 3 (Green)    Shoulder Extension  Theraband;15 reps;Both;Strengthening   x2   Other Standing Lumbar Exercises  Straight arm pull downs 2x15 35lb     Other Standing Lumbar Exercises  Overhead back Ext yellow ball 2x10       Lumbar Exercises: Supine   Bridge  Compliant;15 reps;2 seconds    Other Supine Lumbar Exercises  Supine beidge and march x10 each                PT Short Term Goals - 02/27/19 1343      PT SHORT TERM GOAL #1   Title  independent with initial HEP    Status  Achieved        PT Long Term Goals - 02/20/19 1202      PT LONG TERM GOAL #1   Title  understand posture and body mechanics    Time  8    Period  Weeks    Status  New      PT LONG TERM GOAL #2   Title  decrease pain 50%    Time  8    Period  Weeks    Status  New      PT LONG TERM GOAL #3   Title  report able to lie down without pain    Time  8    Period  Weeks    Status  New            Plan - 02/27/19 1343    Clinical Impression Statement  Pt tolerated an initial progression to TE well. No reports of pain throughout treatment. Pt did really well with all exercises. He does report some LE weakness on leg press. Tactile cues to keep core engaged with standing straight arm pull downs.    Stability/Clinical Decision Making  Stable/Uncomplicated    Rehab Potential  Good    PT Frequency  2x / week    PT Treatment/Interventions  ADLs/Self Care Home Management;Electrical Stimulation;Moist Heat;Traction;Ultrasound;Therapeutic activities;Therapeutic exercise;Manual techniques;Patient/family education    PT Next Visit Plan  start core and gym exercises, could try traction       Patient will benefit from skilled therapeutic intervention in order to improve the following deficits and impairments:  Improper body mechanics, Pain, Postural dysfunction, Increased muscle spasms, Decreased range of motion, Decreased  strength, Impaired flexibility  Visit Diagnosis: Muscle spasm of back  Acute bilateral low back pain without sciatica     Problem List Patient Active Problem List   Diagnosis Date Noted  . Varicose veins of both lower extremities with pain 02/02/2019  . Encounter for therapeutic drug level monitoring 09/19/2018  . Type 2 diabetes mellitus with diabetic polyneuropathy, without long-term current use of insulin ([place]) 03/29/2018  . Diabetic neuropathy ([place]) 03/29/2018  . CAD (coronary artery disease) 08/03/2017  . Post herpetic neuralgia 07/29/2016  . Chronic midline low back pain without sciatica 06/06/2015  . GERD (gastroesophageal reflux disease) 11/15/2014  . Seizure disorder ([place]) 05/07/2014  . Allergic rhinitis 05/07/2014  . Routine general medical examination at a health care facility 05/07/2014    Scot Jun, PTA 02/27/2019, 1:45 PM  [place], [place], [address] Phone: [phone]   Fax:  [phone]  Name: Nicholas Rasmussen MRN: [id number] Date of Birth: 1946/02/24

## 2019-03-05 DIAGNOSIS — B078 Other viral warts: Secondary | ICD-10-CM | POA: Diagnosis not present

## 2019-03-06 ENCOUNTER — Ambulatory Visit: Payer: PPO | Admitting: Physical Therapy

## 2019-03-13 ENCOUNTER — Encounter: Payer: PPO | Admitting: Physical Therapy

## 2019-03-14 ENCOUNTER — Ambulatory Visit: Payer: PPO | Admitting: Physical Therapy

## 2019-03-14 ENCOUNTER — Other Ambulatory Visit: Payer: Self-pay

## 2019-03-14 DIAGNOSIS — M545 Low back pain, unspecified: Secondary | ICD-10-CM

## 2019-03-14 DIAGNOSIS — M62830 Muscle spasm of back: Secondary | ICD-10-CM | POA: Diagnosis not present

## 2019-03-14 NOTE — Therapy (Signed)
[place] [address] [place], [place], [address] Phone: [phone]   Fax:  [phone]  Physical Therapy Treatment  Patient Details  Name: Nicholas Rasmussen MRN: [id number] Date of Birth: May 10, 1946 Referring Provider (PT): Nche   Encounter Date: 03/14/2019  PT End of Session - 03/14/19 1423    Visit Number  3    PT Start Time  6962    PT Stop Time  9528    PT Time Calculation (min)  40 min       Past Medical History:  Diagnosis Date  . Allergic rhinitis 05/07/2014  . Dizziness 01/02/2016  . GERD (gastroesophageal reflux disease)   . Headache 03/13/2015  . Left flank pain 01/02/2016  . Low back pain 06/06/2015  . Pain of left side of body 08/13/2015  . Post herpetic neuralgia   . Routine general medical examination at a health care facility 05/07/2014  . Seizure disorder ([place]) 05/07/2014  . Seizures ([place])    Last seizure in the 70s.  . Shingles   . Stye 05/07/2014    Past Surgical History:  Procedure Laterality Date  . COLONOSCOPY    . KNEE SURGERY Left    left miniscus  . UPPER GASTROINTESTINAL ENDOSCOPY    . uvala surgery      There were no vitals filed for this visit.  Subjective Assessment - 03/14/19 1344    Subjective  "Doing okay, no pain after last session"    Currently in Pain?  No/denies                       [facility] Adult PT Treatment/Exercise - 03/14/19 0001      Lumbar Exercises: Aerobic   Nustep  L3 x 6 min       Lumbar Exercises: Machines for Strengthening   Cybex Knee Extension  10lb 2x10     Cybex Knee Flexion  25lb 2x10     Leg Press  40lb 3x10       Lumbar Exercises: Standing   Row  Theraband;15 reps;Strengthening    Theraband Level (Row)  Level 3 (Green)    Shoulder Extension  Theraband;15 reps;Both;Strengthening    Theraband Level (Shoulder Extension)  Level 3 (Green)    Other Standing Lumbar Exercises  OHP w/ yellow ball 2 x 10      Lumbar Exercises:  Supine   Dead Bug  20 reps;2 seconds    Bridge  20 reps;2 seconds   with ball   Other Supine Lumbar Exercises  K2C and LTR w/ ball 2 x10               PT Short Term Goals - 02/27/19 1343      PT SHORT TERM GOAL #1   Title  independent with initial HEP    Status  Achieved        PT Long Term Goals - 03/14/19 1417      PT LONG TERM GOAL #1   Title  understand posture and body mechanics    Status  On-going      PT LONG TERM GOAL #2   Title  decrease pain 50%    Status  Partially Met      PT LONG TERM GOAL #3   Title  report able to lie down without pain    Status  Partially Met            Plan - 03/14/19 1424  Clinical Impression Statement  Pt tolerated session well with no reports of pain with interventions. pt needs cues for rows to improve proper posture and technique.    Rehab Potential  Good    PT Frequency  2x / week    PT Treatment/Interventions  ADLs/Self Care Home Management;Electrical Stimulation;Moist Heat;Traction;Ultrasound;Therapeutic activities;Therapeutic exercise;Manual techniques;Patient/family education    PT Next Visit Plan  start core and gym exercises, coud try traction       Patient will benefit from skilled therapeutic intervention in order to improve the following deficits and impairments:  Improper body mechanics, Pain, Postural dysfunction, Increased muscle spasms, Decreased range of motion, Decreased strength, Impaired flexibility  Visit Diagnosis: Acute bilateral low back pain without sciatica     Problem List Patient Active Problem List   Diagnosis Date Noted  . Varicose veins of both lower extremities with pain 02/02/2019  . Encounter for therapeutic drug level monitoring 09/19/2018  . Type 2 diabetes mellitus with diabetic polyneuropathy, without long-term current use of insulin ([place]) 03/29/2018  . Diabetic neuropathy ([place]) 03/29/2018  . CAD (coronary artery disease) 08/03/2017  . Post herpetic neuralgia 07/29/2016   . Chronic midline low back pain without sciatica 06/06/2015  . GERD (gastroesophageal reflux disease) 11/15/2014  . Seizure disorder ([place]) 05/07/2014  . Allergic rhinitis 05/07/2014  . Routine general medical examination at a health care facility 05/07/2014    Barrett Henle, [place] 03/14/2019, 2:28 PM  [place], [place], [address] Phone: [phone]   Fax:  [phone]  Name: Nicholas Rasmussen MRN: [id number] Date of Birth: May 28, 1945

## 2019-03-26 ENCOUNTER — Other Ambulatory Visit: Payer: Self-pay

## 2019-03-26 ENCOUNTER — Ambulatory Visit: Payer: PPO | Attending: Nurse Practitioner | Admitting: Physical Therapy

## 2019-03-26 DIAGNOSIS — M545 Low back pain, unspecified: Secondary | ICD-10-CM

## 2019-03-26 DIAGNOSIS — M62830 Muscle spasm of back: Secondary | ICD-10-CM | POA: Diagnosis not present

## 2019-03-26 NOTE — Therapy (Addendum)
.[place], [place], [address] Phone: [phone]   Fax:  [phone]  Physical Therapy Treatment  Patient Details  Name: Nicholas Rasmussen MRN: [id number] Date of Birth: 06/01/1945 Referring Provider (PT): Nche   Encounter Date: 03/26/2019  PT End of Session - 03/26/19 1342    Visit Number  4    PT Start Time  1300    PT Stop Time  1341    PT Time Calculation (min)  41 min       Past Medical History:  Diagnosis Date  . Allergic rhinitis 05/07/2014  . Dizziness 01/02/2016  . GERD (gastroesophageal reflux disease)   . Headache 03/13/2015  . Left flank pain 01/02/2016  . Low back pain 06/06/2015  . Pain of left side of body 08/13/2015  . Post herpetic neuralgia   . Routine general medical examination at a health care facility 05/07/2014  . Seizure disorder ([place]) 05/07/2014  . Seizures ([place])    Last seizure in the 70s.  . Shingles   . Stye 05/07/2014    Past Surgical History:  Procedure Laterality Date  . COLONOSCOPY    . KNEE SURGERY Left    left miniscus  . UPPER GASTROINTESTINAL ENDOSCOPY    . uvala surgery      There were no vitals filed for this visit.  Subjective Assessment - 03/26/19 1302    Subjective  "doing good, felt pretty good after last session". pt states he has the most pain while lying down.    Currently in Pain?  No/denies                       [facility] Adult PT Treatment/Exercise - 03/26/19 0001      Ambulation/Gait   Gait Comments  2 flights of stairs with 4#, marching down hall w/ 4#      Lumbar Exercises: Aerobic   Nustep  L3 x 6 min       Lumbar Exercises: Machines for Strengthening   Cybex Knee Extension  10lb 3x10     Cybex Knee Flexion  25lb 3x10     Leg Press  40lb 3x10       Lumbar Exercises: Standing   Row  Theraband;Strengthening;20 reps    Theraband Level (Row)  Level 4 (Blue)    Shoulder Extension   Theraband;Both;Strengthening;20 reps    Theraband Level (Shoulder Extension)  Level 4 (Blue)      Lumbar Exercises: Seated   Sit to Stand  10 reps;Other (comment)   w/ OHP and chest press   Other Seated Lumbar Exercises  ab set w/ red ball      Unilateral Overhead farmers carry down hall 4lb          PT Short Term Goals - 02/27/19 1343      PT SHORT TERM GOAL #1   Title  independent with initial HEP    Status  Achieved        PT Long Term Goals - 03/14/19 1417      PT LONG TERM GOAL #1   Title  understand posture and body mechanics    Status  On-going      PT LONG TERM GOAL #2   Title  decrease pain 50%    Status  Partially Met      PT LONG TERM GOAL #3   Title  report able to lie down without pain    Status  Partially Met            Plan - 03/26/19 1344    Clinical Impression Statement  pt needs tactile cues with rows to improve form and technique. pt able to increase reps of knee flex/ext. pt able to negoiate stairs while holding 4# weights. pt needs tactile cues with farm carries to [place] core.    Rehab Potential  Good    PT Frequency  2x / week    PT Duration  8 weeks    PT Treatment/Interventions  ADLs/Self Care Home Management;Electrical Stimulation;Moist Heat;Traction;Ultrasound;Therapeutic activities;Therapeutic exercise;Manual techniques;Patient/family education    PT Next Visit Plan  start core and gym exercises, coud try traction       Patient will benefit from skilled therapeutic intervention in order to improve the following deficits and impairments:  Improper body mechanics, Pain, Postural dysfunction, Increased muscle spasms, Decreased range of motion, Decreased strength, Impaired flexibility  Visit Diagnosis: Acute bilateral low back pain without sciatica     Problem List Patient Active Problem List   Diagnosis Date Noted  . Varicose veins of both lower extremities with pain 02/02/2019  . Encounter for therapeutic drug level  monitoring 09/19/2018  . Type 2 diabetes mellitus with diabetic polyneuropathy, without long-term current use of insulin ([place]) 03/29/2018  . Diabetic neuropathy ([place]) 03/29/2018  . CAD (coronary artery disease) 08/03/2017  . Post herpetic neuralgia 07/29/2016  . Chronic midline low back pain without sciatica 06/06/2015  . GERD (gastroesophageal reflux disease) 11/15/2014  . Seizure disorder ([place]) 05/07/2014  . Allergic rhinitis 05/07/2014  . Routine general medical examination at a health care facility 05/07/2014    [place], [place] 03/26/2019, 1:49 PM  [place], [place], [address] Phone: [phone]   Fax:  [phone]  Name: Nicholas Rasmussen MRN: [id number] Date of Birth: 1946-05-19

## 2019-03-27 ENCOUNTER — Ambulatory Visit: Payer: PPO | Admitting: Physical Therapy

## 2019-04-04 ENCOUNTER — Ambulatory Visit: Payer: PPO | Admitting: Nurse Practitioner

## 2019-04-05 ENCOUNTER — Ambulatory Visit: Payer: PPO | Admitting: Physical Therapy

## 2019-04-05 ENCOUNTER — Other Ambulatory Visit: Payer: Self-pay

## 2019-04-05 DIAGNOSIS — M545 Low back pain, unspecified: Secondary | ICD-10-CM

## 2019-04-05 NOTE — Therapy (Signed)
[address] [place], [place], [address] Phone: [phone]   Fax:  [phone]  Physical Therapy Treatment  Patient Details  Name: Nicholas Rasmussen MRN: [id number] Date of Birth: [birth year] Referring Provider (PT): Nche   Encounter Date: 04/05/2019  PT End of Session - 04/05/19 1342    Visit Number  5    PT Start Time  1300    PT Stop Time  1342    PT Time Calculation (min)  42 min       Past Medical History:  Diagnosis Date  . Allergic rhinitis 05/07/2014  . Dizziness 01/02/2016  . GERD (gastroesophageal reflux disease)   . Headache 03/13/2015  . Left flank pain 01/02/2016  . Low back pain 06/06/2015  . Pain of left side of body 08/13/2015  . Post herpetic neuralgia   . Routine general medical examination at a health care facility 05/07/2014  . Seizure disorder ([place]) 05/07/2014  . Seizures ([place])    Last seizure in the 70s.  . Shingles   . Stye 05/07/2014    Past Surgical History:  Procedure Laterality Date  . COLONOSCOPY    . KNEE SURGERY Left    left miniscus  . UPPER GASTROINTESTINAL ENDOSCOPY    . uvala surgery      There were no vitals filed for this visit.  Subjective Assessment - 04/05/19 1305    Subjective  "doing pretty good"    Currently in Pain?  No/denies                       [facility] Adult PT Treatment/Exercise - 04/05/19 0001      Ambulation/Gait   Ambulation/Gait  Yes   w/ farm carries 5# weights   Ambulation/Gait Assistance  7: Independent    Gait Comments  3 flights of stairs w/ 5# weights      Lumbar Exercises: Aerobic   Nustep  L3 x 6 min       Lumbar Exercises: Machines for Strengthening   Cybex Knee Extension  10lb 2 x 15    Cybex Knee Flexion  25lb 2 x 15    Leg Press  40lb 3x10       Lumbar Exercises: Standing   Row  Theraband;Strengthening   2 x 15   Theraband Level (Row)  Level 2 (Red)    Other Standing Lumbar Exercises  lumbar ext 2  x 10      Lumbar Exercises: Seated   Sit to Stand  20 reps   w/ OHP and chest presss              PT Short Term Goals - 02/27/19 1343      PT SHORT TERM GOAL #1   Title  independent with initial HEP    Status  Achieved        PT Long Term Goals - 04/05/19 1306      PT LONG TERM GOAL #1   Title  understand posture and body mechanics    Status  Partially Met      PT LONG TERM GOAL #2   Title  decrease pain 50%    Status  Achieved      PT LONG TERM GOAL #3   Title  report able to lie down without pain    Status  Partially Met            Plan - 04/05/19 1344  Clinical Impression Statement  pt needs verbal and tactile cues with rows to improve form and technique. pt progressed to standing lumbar extension. pt needs cues with OHP and farm carries to activate the core. pt able to increase weight with farm carries.    Stability/Clinical Decision Making  Stable/Uncomplicated    Rehab Potential  Good    PT Frequency  2x / week    PT Duration  8 weeks    PT Treatment/Interventions  ADLs/Self Care Home Management;Electrical Stimulation;Moist Heat;Traction;Ultrasound;Therapeutic activities;Therapeutic exercise;Manual techniques;Patient/family education    PT Next Visit Plan  continue core exercises       Patient will benefit from skilled therapeutic intervention in order to improve the following deficits and impairments:  Improper body mechanics, Pain, Postural dysfunction, Increased muscle spasms, Decreased range of motion, Decreased strength, Impaired flexibility  Visit Diagnosis: Acute bilateral low back pain without sciatica     Problem List Patient Active Problem List   Diagnosis Date Noted  . Varicose veins of both lower extremities with pain 02/02/2019  . Encounter for therapeutic drug level monitoring 09/19/2018  . Type 2 diabetes mellitus with diabetic polyneuropathy, without long-term current use of insulin ([place]) 03/29/2018  . Diabetic neuropathy  ([place]) 03/29/2018  . CAD (coronary artery disease) 08/03/2017  . Post herpetic neuralgia 07/29/2016  . Chronic midline low back pain without sciatica 06/06/2015  . GERD (gastroesophageal reflux disease) 11/15/2014  . Seizure disorder ([place]) 05/07/2014  . Allergic rhinitis 05/07/2014  . Routine general medical examination at a health care facility 05/07/2014    [place], [place] 04/05/2019, 1:47 PM  [place], [place], [address] Phone: [phone]   Fax:  [phone]  Name: Artie Mcintyre MRN: [id number] Date of Birth: 02/07/1946

## 2019-04-09 ENCOUNTER — Ambulatory Visit: Payer: PPO | Admitting: Physical Therapy

## 2019-04-10 ENCOUNTER — Telehealth: Payer: Self-pay | Admitting: Nurse Practitioner

## 2019-04-10 NOTE — Telephone Encounter (Signed)

## 2019-04-11 ENCOUNTER — Encounter: Payer: Self-pay | Admitting: Nurse Practitioner

## 2019-04-11 ENCOUNTER — Ambulatory Visit (INDEPENDENT_AMBULATORY_CARE_PROVIDER_SITE_OTHER): Payer: PPO | Admitting: Nurse Practitioner

## 2019-04-11 ENCOUNTER — Other Ambulatory Visit: Payer: Self-pay

## 2019-04-11 VITALS — BP 120/80 | HR 54 | Temp 95.9°F | Ht 75.5 in | Wt 240.4 lb

## 2019-04-11 DIAGNOSIS — E782 Mixed hyperlipidemia: Secondary | ICD-10-CM | POA: Diagnosis not present

## 2019-04-11 DIAGNOSIS — E1141 Type 2 diabetes mellitus with diabetic mononeuropathy: Secondary | ICD-10-CM

## 2019-04-11 DIAGNOSIS — E1142 Type 2 diabetes mellitus with diabetic polyneuropathy: Secondary | ICD-10-CM

## 2019-04-11 LAB — LIPID PANEL
Cholesterol: 130 mg/dL (ref 0–200)
HDL: 43.8 mg/dL (ref >39.00)
LDL Cholesterol: 75 mg/dL (ref 0–99)
NonHDL: 86.11
Total CHOL/HDL Ratio: 3
Triglycerides: 54 mg/dL (ref 0.0–149.0)
VLDL: 10.8 mg/dL (ref 0.0–40.0)

## 2019-04-11 LAB — HEPATIC FUNCTION PANEL
ALT: 6 U/L (ref 0–53)
AST: 16 U/L (ref 0–37)
Albumin: 4.2 g/dL (ref 3.5–5.2)
Alkaline Phosphatase: 50 U/L (ref 39–117)
Bilirubin, Direct: 0.1 mg/dL (ref 0.0–0.3)
Total Bilirubin: 0.5 mg/dL (ref 0.2–1.2)
Total Protein: 7.2 g/dL (ref 6.0–8.3)

## 2019-04-11 LAB — BASIC METABOLIC PANEL
BUN: 16 mg/dL (ref 6–23)
CO2: 33 mEq/L — ABNORMAL HIGH (ref 19–32)
Calcium: 9.4 mg/dL (ref 8.4–10.5)
Chloride: 104 mEq/L (ref 96–112)
Creatinine, Ser: 1.05 mg/dL (ref 0.40–1.50)
GFR: 84.24 mL/min (ref >60.00)
Glucose, Bld: 89 mg/dL (ref 70–99)
Potassium: 4.4 mEq/L (ref 3.5–5.1)
Sodium: 141 mEq/L (ref 135–145)

## 2019-04-11 LAB — HEMOGLOBIN A1C: Hgb A1c MFr Bld: 6.4 % (ref 4.6–6.5)

## 2019-04-11 NOTE — Progress Notes (Signed)
Subjective:  Patient ID: Nicholas Rasmussen, male    DOB: 25-Feb-1946  Age: 73 y.o. MRN: [id number]  CC: Follow-up (follow up DM,HTN,hyperlipidemia/fasting/)  HPI Mr. Eschbach is here for DM, HTN and hyperlipidemia f/up. He denies any acute compliant today. Reports improved back pain with PT sessions. His BP is at goal. He does not check glucose at home. He is up to date with eye exam, and foot exam.he has stable bilateral LE neuropathy with use of gabapentin at HSprn. BP Readings from Last 3 Encounters:  04/11/19 120/80  02/02/19 120/60  01/02/19 120/62   Wt Readings from Last 3 Encounters:  04/11/19 240 lb 6.4 oz (109 kg)  02/02/19 236 lb 3.2 oz (107.1 kg)  01/02/19 233 lb 3.2 oz (105.8 kg)   Reviewed past Medical, Social and Family history today.  Outpatient Medications Prior to Visit  Medication Sig Dispense Refill  . acetaminophen (TYLENOL) 500 MG tablet Take 1 tablet (500 mg total) by mouth every 8 (eight) hours as needed. 30 tablet 0  . atorvastatin (LIPITOR) 20 MG tablet Take 1 tablet (20 mg total) by mouth daily at 6 PM. 90 tablet 3  . divalproex (DEPAKOTE ER) 500 MG 24 hr tablet Take 1 tablet (500 mg total) by mouth at bedtime. 90 tablet 4  . gabapentin (NEURONTIN) 300 MG capsule Take 1 capsule (300 mg total) by mouth at bedtime as needed. Please call [phone] to schedule yearly follow up appt. 90 capsule 0  . metoprolol succinate (TOPROL-XL) 25 MG 24 hr tablet Take 1 tablet (25 mg total) by mouth daily. 90 tablet 3  . vitamin C (ASCORBIC ACID) 500 MG tablet Take 500 mg by mouth daily.    [place] Kitchen glipiZIDE (GLUCOTROL XL) 5 MG 24 hr tablet Take 1 tablet (5 mg total) by mouth daily with breakfast. 90 tablet 1  . triamcinolone cream (KENALOG) 0.1 % Apply 1 application topically 2 (two) times daily. (Patient not taking: Reported on 04/11/2019) 15 g 1   No facility-administered medications prior to visit.     ROS See HPI  Objective:  BP 120/80   Pulse (!) 54   Temp (!) 95.9 F  (35.5 C) (Tympanic)   Ht 6' 3.5" (1.918 m)   Wt 240 lb 6.4 oz (109 kg)   SpO2 99%   BMI 29.65 kg/m   BP Readings from Last 3 Encounters:  04/11/19 120/80  02/02/19 120/60  01/02/19 120/62    Wt Readings from Last 3 Encounters:  04/11/19 240 lb 6.4 oz (109 kg)  02/02/19 236 lb 3.2 oz (107.1 kg)  01/02/19 233 lb 3.2 oz (105.8 kg)   Physical Exam Vitals signs reviewed.  Cardiovascular:     Rate and Rhythm: Normal rate and regular rhythm.     Pulses: Normal pulses.     Heart sounds: Normal heart sounds.  Pulmonary:     Effort: Pulmonary effort is normal.     Breath sounds: Normal breath sounds.  Skin:    General: Skin is warm and dry.     Findings: No erythema or rash.     Comments: Completed diabetic foot exam  Neurological:     Mental Status: He is alert and oriented to person, place, and time.  Psychiatric:        Mood and Affect: Mood normal.        Behavior: Behavior normal.        Thought Content: Thought content normal.     Lab Results  Component Value Date  WBC 6.9 10/15/2017   HGB 12.4 (L) 10/15/2017   HCT 38.8 (L) 10/15/2017   PLT 202 10/15/2017   GLUCOSE 89 04/11/2019   CHOL 130 04/11/2019   TRIG 54.0 04/11/2019   HDL 43.80 04/11/2019   LDLCALC 75 04/11/2019   ALT 6 04/11/2019   AST 16 04/11/2019   NA 141 04/11/2019   K 4.4 04/11/2019   CL 104 04/11/2019   CREATININE 1.05 04/11/2019   BUN 16 04/11/2019   CO2 33 (H) 04/11/2019   TSH 1.08 11/12/2014   PSA 0.42 11/17/2016   HGBA1C 6.4 04/11/2019   MICROALBUR 3.3 (H) 10/10/2018    Ct Abdomen W Wo Contrast  Result Date: 02/09/2019 CLINICAL DATA:  Follow up of left renal cystic lesion EXAM: CT ABDOMEN WITHOUT AND WITH CONTRAST TECHNIQUE: Multidetector CT imaging of the abdomen was performed following the standard protocol before and following the bolus administration of intravenous contrast. CONTRAST:  122mL ISOVUE-300 IOPAMIDOL (ISOVUE-300) INJECTION 61% COMPARISON:  04/27/2016 FINDINGS: Lower  chest: Centrilobular emphysema. Hepatobiliary: There are about 10 small hypodense hepatic lesions scattered in the liver, most of which are technically too small to characterize, but the stability from 04/27/2016 this reassuring that these are highly likely to be small benign lesions such as cysts. Largest measures 1.1 by 0.9 cm in the right hepatic lobe on image 34/5. Gallbladder unremarkable. Pancreas: Unremarkable Spleen: Unremarkable Adrenals/Urinary Tract: Both adrenal glands appear normal. A rim calcified left kidney upper pole cystic lesion measures 2.0 by 2.2 cm on image 52/2, with the slightly irregular marginal calcification up to 2.5 mm in thickness. Internal density 12 Hounsfield units precontrast, 16 Hounsfield units arterial phase, 15 Hounsfield units portal venous phase, compatible with lack of internal enhancement. No internal enhancing nodularity in this lesion. Back on 04/27/2016, the lesion measured 2.2 by 2.0 cm, stable. Additional small hypodense renal lesions are technically too small to characterize although statistically likely to be benign. A 0.7 by 0.6 cm lesion of the right kidney lower pole on image 34/7 may have some minimal complexity but is not changed from 2017. Stomach/Bowel: Unremarkable Vascular/Lymphatic: Aortoiliac atherosclerotic vascular disease. Other: No supplemental non-categorized findings. Musculoskeletal: Bridging spurring of the right sacroiliac joint. Lower lumbar spondylosis and degenerative disc disease causing mild foraminal impingement bilaterally at L5-S1 on the left at L4-5. IMPRESSION: 1. The rim calcified left kidney upper pole lesion is stable in size and appearance from 2017. The rim calcification is somewhat thick but there is no abnormal internal enhancement. Because of the thick marginal calcification this is a Bosniak category 311F cyst (likely benign, but warranting surveillance). We have now documented nearly 3 years of stability. Typically, Bosniak  category 311F cyst should be followed for 5 years. Accordingly, I recommend follow up renal protocol CT or MRI in 1 years time. This recommendation follows ACR consensus guidelines: Management of the Incidental Renal Mass on CT: A White Paper of the ACR Incidental Findings Committee. J Am Coll Radiol 2018;15:264-273. 2. Scattered small hypodense hepatic lesions unchanged from 2017, highly likely to be benign. 3. Centrilobular emphysema. 4. Potentially complex 7 mm lesion of the right kidney lower pole is unchanged from 2017. This is technically too small to characterize but can be surveilled at the time of follow up renal imaging. 5.  Aortic Atherosclerosis (ICD10-I70.0). 6. Lower lumbar impingement. Electronically Signed   By: Van Clines M.D.   On: 02/09/2019 10:46    Assessment & Plan:   Ahmarion was seen today for follow-up.  Diagnoses and  all orders for this visit:  Type 2 diabetes mellitus with diabetic polyneuropathy, without long-term current use of insulin ([facility]) -     DM_1 Hemoglobin A1c -     Hepatic function panel -     glipiZIDE (GLUCOTROL XL) 5 MG 24 hr tablet; Take 1 tablet (5 mg total) by mouth daily with breakfast.  Diabetic mononeuropathy associated with type 2 diabetes mellitus ([place]) -     DM_1 Hemoglobin A1c -     HTN_1 BMP  Mixed hyperlipidemia -     HTN_4 Lipid panel -     Hepatic function panel   I am having [name] maintain his acetaminophen, vitamin C, divalproex, gabapentin, metoprolol succinate, atorvastatin, triamcinolone cream, and glipiZIDE.  Meds ordered this encounter  Medications  . glipiZIDE (GLUCOTROL XL) 5 MG 24 hr tablet    Sig: Take 1 tablet (5 mg total) by mouth daily with breakfast.    Dispense:  90 tablet    Refill:  1    Discontinue metformin    Order Specific Question:   Supervising Provider    Answer:   MATTHEWS, CODY [[id number]]    Problem List Items Addressed This Visit      Endocrine   Diabetic neuropathy ([place])   Relevant  Medications   glipiZIDE (GLUCOTROL XL) 5 MG 24 hr tablet   Other Relevant Orders   DM_1 Hemoglobin A1c (Completed)   HTN_1 BMP (Completed)   Type 2 diabetes mellitus with diabetic polyneuropathy, without long-term current use of insulin ([facility]CC) - Primary   Relevant Medications   glipiZIDE (GLUCOTROL XL) 5 MG 24 hr tablet   Other Relevant Orders   DM_1 Hemoglobin A1c (Completed)   Hepatic function panel (Completed)    Other Visit Diagnoses    Mixed hyperlipidemia       Relevant Orders   HTN_4 Lipid panel (Completed)   Hepatic function panel (Completed)      Follow-up: Return in about 6 months (around 10/09/2019) for DM and HTN, hyperlipidemia (fasting, virtual or F45f).  [name], NP

## 2019-04-11 NOTE — Patient Instructions (Signed)
You are due for cardiology f/up 10/2019.  Go to lab for blood draw.

## 2019-04-12 ENCOUNTER — Encounter: Payer: Self-pay | Admitting: Physical Therapy

## 2019-04-12 ENCOUNTER — Ambulatory Visit: Payer: PPO | Admitting: Physical Therapy

## 2019-04-12 DIAGNOSIS — M545 Low back pain, unspecified: Secondary | ICD-10-CM

## 2019-04-12 DIAGNOSIS — M62830 Muscle spasm of back: Secondary | ICD-10-CM

## 2019-04-12 NOTE — Therapy (Signed)
[place], [place], [address] Phone: [phone]   Fax:  [phone]  Physical Therapy Treatment  Patient Details  Name: Nicholas Rasmussen MRN: [id number] Date of Birth: 21-Mar-1946 Referring Provider (PT): Nche   Encounter Date: 04/12/2019  PT End of Session - 04/12/19 1339    Visit Number  6    Date for PT Re-Evaluation  04/22/19    PT Start Time  1300    PT Stop Time  1340    PT Time Calculation (min)  40 min    Activity Tolerance  Patient tolerated treatment well    Behavior During Therapy  [facility] for tasks assessed/performed       Past Medical History:  Diagnosis Date  . Allergic rhinitis 05/07/2014  . Dizziness 01/02/2016  . GERD (gastroesophageal reflux disease)   . Headache 03/13/2015  . Left flank pain 01/02/2016  . Low back pain 06/06/2015  . Pain of left side of body 08/13/2015  . Post herpetic neuralgia   . Routine general medical examination at a health care facility 05/07/2014  . Seizure disorder ([place]) 05/07/2014  . Seizures ([place])    Last seizure in the 70s.  . Shingles   . Stye 05/07/2014    Past Surgical History:  Procedure Laterality Date  . COLONOSCOPY    . KNEE SURGERY Left    left miniscus  . UPPER GASTROINTESTINAL ENDOSCOPY    . uvala surgery      There were no vitals filed for this visit.  Subjective Assessment - 04/12/19 1306    Subjective  Still doing pretty good, still having low back pain at times    Currently in Pain?  No/denies                       [facility] Adult PT Treatment/Exercise - 04/12/19 0001      Lumbar Exercises: Aerobic   Elliptical  I10 R5 x 3 min     Nustep  L5 x 6 min       Lumbar Exercises: Machines for Strengthening   Cybex Knee Extension  15lb 2x15    Cybex Knee Flexion  35 2x15     Other Lumbar Machine Exercise  seated Rows and Lats 35lb 2x10       Lumbar Exercises: Standing   Shoulder Extension   Both;Strengthening;20 reps    Shoulder Extension Limitations  20lb pully     Other Standing Lumbar Exercises  Overhead Ext yellow ball 2x10    Other Standing Lumbar Exercises  Ar press 35lb x 10 each,       Lumbar Exercises: Seated   Sit to Stand  20 reps   forward press with blue ball      Lumbar Exercises: Quadruped   Plank  4 x 10 sec                PT Short Term Goals - 02/27/19 1343      PT SHORT TERM GOAL #1   Title  independent with initial HEP    Status  Achieved        PT Long Term Goals - 04/05/19 1306      PT LONG TERM GOAL #1   Title  understand posture and body mechanics    Status  Partially Met      PT LONG TERM GOAL #2   Title  decrease pain 50%    Status  Achieved  PT LONG TERM GOAL #3   Title  report able to lie down without pain    Status  Partially Met            Plan - 04/12/19 1340    Clinical Impression Statement  Pt did well with a progressed therapy session. Good for and stability with prone rows. Tactile cues needed to keep core engaged with shoulder extensions and seated rows. Cues needed to extensions lower back with overhead extensions. No reports of pain.    Rehab Potential  Good    PT Frequency  2x / week    PT Duration  8 weeks    PT Treatment/Interventions  ADLs/Self Care Home Management;Electrical Stimulation;Moist Heat;Traction;Ultrasound;Therapeutic activities;Therapeutic exercise;Manual techniques;Patient/family education    PT Next Visit Plan  continue core exercises       Patient will benefit from skilled therapeutic intervention in order to improve the following deficits and impairments:  Improper body mechanics, Pain, Postural dysfunction, Increased muscle spasms, Decreased range of motion, Decreased strength, Impaired flexibility  Visit Diagnosis: Acute bilateral low back pain without sciatica  Muscle spasm of back     Problem List Patient Active Problem List   Diagnosis Date Noted  . Varicose  veins of both lower extremities with pain 02/02/2019  . Encounter for therapeutic drug level monitoring 09/19/2018  . Type 2 diabetes mellitus with diabetic polyneuropathy, without long-term current use of insulin ([place]) 03/29/2018  . Diabetic neuropathy ([place]) 03/29/2018  . CAD (coronary artery disease) 08/03/2017  . Post herpetic neuralgia 07/29/2016  . Dizziness 01/02/2016  . Chronic midline low back pain without sciatica 06/06/2015  . GERD (gastroesophageal reflux disease) 11/15/2014  . Seizure disorder ([place]) 05/07/2014  . Allergic rhinitis 05/07/2014  . Routine general medical examination at a health care facility 05/07/2014    Scot Jun, PTA 04/12/2019, 1:41 PM  [place], [place], [address] Phone: [phone]   Fax:  [phone]  Name: Elias Dennington MRN: [id number] Date of Birth: 09/29/1945

## 2019-04-14 MED ORDER — GLIPIZIDE ER 5 MG PO TB24: 5.0000 mg | ORAL_TABLET | Freq: Every day | ORAL | 1 refills | Status: DC

## 2019-04-17 ENCOUNTER — Ambulatory Visit: Payer: PPO | Admitting: Physical Therapy

## 2019-04-17 ENCOUNTER — Other Ambulatory Visit: Payer: Self-pay

## 2019-04-17 ENCOUNTER — Encounter: Payer: Self-pay | Admitting: Physical Therapy

## 2019-04-17 DIAGNOSIS — M62830 Muscle spasm of back: Secondary | ICD-10-CM

## 2019-04-17 DIAGNOSIS — M545 Low back pain, unspecified: Secondary | ICD-10-CM

## 2019-04-17 NOTE — Therapy (Signed)
[place], [place], [address] Phone: [phone]   Fax:  [phone]  Physical Therapy Treatment  Patient Details  Name: Nicholas Rasmussen MRN: [id number] Date of Birth: February 16, 1946 Referring Provider (PT): Nche   Encounter Date: 04/17/2019  PT End of Session - 04/17/19 1551    Visit Number  7    Date for PT Re-Evaluation  04/22/19    PT Start Time  6314    PT Stop Time  1555    PT Time Calculation (min)  40 min    Activity Tolerance  Patient tolerated treatment well    Behavior During Therapy  [facility] for tasks assessed/performed       Past Medical History:  Diagnosis Date  . Allergic rhinitis 05/07/2014  . Dizziness 01/02/2016  . GERD (gastroesophageal reflux disease)   . Headache 03/13/2015  . Left flank pain 01/02/2016  . Low back pain 06/06/2015  . Pain of left side of body 08/13/2015  . Post herpetic neuralgia   . Routine general medical examination at a health care facility 05/07/2014  . Seizure disorder ([place]) 05/07/2014  . Seizures ([place])    Last seizure in the 70s.  . Shingles   . Stye 05/07/2014    Past Surgical History:  Procedure Laterality Date  . COLONOSCOPY    . KNEE SURGERY Left    left miniscus  . UPPER GASTROINTESTINAL ENDOSCOPY    . uvala surgery      There were no vitals filed for this visit.  Subjective Assessment - 04/17/19 1518    Subjective  Pain isn't constant only with certain movements    Currently in Pain?  Yes    Pain Score  5     Pain Location  Back         [facility] PT Assessment - 04/17/19 0001      AROM   Overall AROM   Within functional limits for tasks performed                   [facility] Adult PT Treatment/Exercise - 04/17/19 0001      Lumbar Exercises: Aerobic   Elliptical  I10 R5 x 3 min     Nustep  L5 x 6 min       Lumbar Exercises: Machines for Strengthening   Leg Press  60lb 3x10     Other Lumbar Machine Exercise  seated  Rows and Lats 45lb 2x10       Lumbar Exercises: Standing   Other Standing Lumbar Exercises  Overhead Ext yellow ball 2x10; Standing hip ext 10lb x10     Other Standing Lumbar Exercises  straight arm pull downs 35lb 2x10; AR press 35lb x10               PT Short Term Goals - 02/27/19 1343      PT SHORT TERM GOAL #1   Title  independent with initial HEP    Status  Achieved        PT Long Term Goals - 04/17/19 1519      PT LONG TERM GOAL #1   Title  understand posture and body mechanics    Status  Achieved      PT LONG TERM GOAL #2   Title  decrease pain 50%    Status  Partially Met      PT LONG TERM GOAL #3   Title  report able to lie down without pain  Status  Achieved            Plan - 04/17/19 1552    Clinical Impression Statement  Pt has progressed increasing his lumbar ROM. He did well with the exercises but does show signs of fatigue. Core weakness noted with AR presses. Tactile cues needed with Straight arm pull downs to engage core. Increased weight tolerated with leg press.    Stability/Clinical Decision Making  Stable/Uncomplicated    Rehab Potential  Good    PT Frequency  2x / week    PT Treatment/Interventions  ADLs/Self Care Home Management;Electrical Stimulation;Moist Heat;Traction;Ultrasound;Therapeutic activities;Therapeutic exercise;Manual techniques;Patient/family education    PT Next Visit Plan  continue core exercises       Patient will benefit from skilled therapeutic intervention in order to improve the following deficits and impairments:  Improper body mechanics, Pain, Postural dysfunction, Increased muscle spasms, Decreased range of motion, Decreased strength, Impaired flexibility  Visit Diagnosis: Muscle spasm of back  Acute bilateral low back pain without sciatica     Problem List Patient Active Problem List   Diagnosis Date Noted  . Varicose veins of both lower extremities with pain 02/02/2019  . Encounter for  therapeutic drug level monitoring 09/19/2018  . Type 2 diabetes mellitus with diabetic polyneuropathy, without long-term current use of insulin ([place]) 03/29/2018  . Diabetic neuropathy ([place]) 03/29/2018  . CAD (coronary artery disease) 08/03/2017  . Post herpetic neuralgia 07/29/2016  . Dizziness 01/02/2016  . Chronic midline low back pain without sciatica 06/06/2015  . GERD (gastroesophageal reflux disease) 11/15/2014  . Seizure disorder ([place]) 05/07/2014  . Allergic rhinitis 05/07/2014  . Routine general medical examination at a health care facility 05/07/2014    Scot Jun, PTA 04/17/2019, 3:54 PM  [place], [place], [address] Phone: [phone]   Fax:  [phone]  Name: Nicholas Rasmussen MRN: [id number] Date of Birth: 1945/07/26

## 2019-04-24 ENCOUNTER — Ambulatory Visit: Payer: PPO | Admitting: Physical Therapy

## 2019-04-25 ENCOUNTER — Other Ambulatory Visit: Payer: Self-pay

## 2019-04-25 ENCOUNTER — Encounter: Payer: Self-pay | Admitting: Physical Therapy

## 2019-04-25 ENCOUNTER — Ambulatory Visit: Payer: PPO | Attending: Nurse Practitioner | Admitting: Physical Therapy

## 2019-04-25 DIAGNOSIS — M545 Low back pain, unspecified: Secondary | ICD-10-CM

## 2019-04-25 DIAGNOSIS — M62830 Muscle spasm of back: Secondary | ICD-10-CM | POA: Insufficient documentation

## 2019-04-25 NOTE — Addendum Note (Signed)
Addended by: Sumner Boast on: 04/25/2019 01:55 PM   Modules accepted: Orders

## 2019-04-25 NOTE — Therapy (Signed)
[place], [place], [address] Phone: [phone]   Fax:  [phone]  Physical Therapy Treatment  Patient Details  Name: Nicholas Rasmussen MRN: [id number] Date of Birth: 1946-04-11 Referring Provider (PT): Nche   Encounter Date: 04/25/2019  PT End of Session - 04/25/19 0321    Visit Number  8    Date for PT Re-Evaluation  04/22/19    PT Start Time  1300    PT Stop Time  1345    PT Time Calculation (min)  45 min    Activity Tolerance  Patient tolerated treatment well    Behavior During Therapy  [facility] for tasks assessed/performed       Past Medical History:  Diagnosis Date  . Allergic rhinitis 05/07/2014  . Dizziness 01/02/2016  . GERD (gastroesophageal reflux disease)   . Headache 03/13/2015  . Left flank pain 01/02/2016  . Low back pain 06/06/2015  . Pain of left side of body 08/13/2015  . Post herpetic neuralgia   . Routine general medical examination at a health care facility 05/07/2014  . Seizure disorder ([place]) 05/07/2014  . Seizures ([place])    Last seizure in the 70s.  . Shingles   . Stye 05/07/2014    Past Surgical History:  Procedure Laterality Date  . COLONOSCOPY    . KNEE SURGERY Left    left miniscus  . UPPER GASTROINTESTINAL ENDOSCOPY    . uvala surgery      There were no vitals filed for this visit.  Subjective Assessment - 04/25/19 1307    Subjective  "I guess ill do for an old man"    Currently in Pain?  No/denies                       [facility] Adult PT Treatment/Exercise - 04/25/19 0001      Lumbar Exercises: Aerobic   Nustep  L7 x 6 min       Lumbar Exercises: Machines for Strengthening   Cybex Knee Extension  20lb 2x15    Cybex Knee Flexion  45 2x15     Leg Press  60lb 2x15; 40lb SL x10 each     Other Lumbar Machine Exercise  seated Rows and Lats 45lb 2x10       Lumbar Exercises: Standing   Shoulder Extension  Both;Strengthening;20 reps    Shoulder Extension Limitations  15lb pulley    Other Standing Lumbar Exercises  Overhead Ext yellow ball 2x10; Squat & Row pulley 35lb 2x10     Other Standing Lumbar Exercises  Ar press 35lb x 10 each,                PT Short Term Goals - 02/27/19 1343      PT SHORT TERM GOAL #1   Title  independent with initial HEP    Status  Achieved        PT Long Term Goals - 04/17/19 1519      PT LONG TERM GOAL #1   Title  understand posture and body mechanics    Status  Achieved      PT LONG TERM GOAL #2   Title  decrease pain 50%    Status  Partially Met      PT LONG TERM GOAL #3   Title  report able to lie down without pain    Status  Achieved  Plan - 04/25/19 1346    Clinical Impression Statement  Pt reports that's he is doing well over all but still has the occasional low back pain. All interventions completed well with no increase in pain. Continues with core strengthening interventions as well as add some SL strengthening. Tactile cues given for posture and to engage core with shoulder extensions. Weakness noted with anti rotational presses more so pulling pt to R.    Stability/Clinical Decision Making  Stable/Uncomplicated    Rehab Potential  Good    PT Frequency  2x / week    PT Duration  8 weeks    PT Treatment/Interventions  ADLs/Self Care Home Management;Electrical Stimulation;Moist Heat;Traction;Ultrasound;Therapeutic activities;Therapeutic exercise;Manual techniques;Patient/family education    PT Next Visit Plan  continue core exercises       Patient will benefit from skilled therapeutic intervention in order to improve the following deficits and impairments:  Improper body mechanics, Pain, Postural dysfunction, Increased muscle spasms, Decreased range of motion, Decreased strength, Impaired flexibility  Visit Diagnosis: Acute bilateral low back pain without sciatica  Muscle spasm of back     Problem List Patient Active Problem List    Diagnosis Date Noted  . Varicose veins of both lower extremities with pain 02/02/2019  . Encounter for therapeutic drug level monitoring 09/19/2018  . Type 2 diabetes mellitus with diabetic polyneuropathy, without long-term current use of insulin ([place]) 03/29/2018  . Diabetic neuropathy ([place]) 03/29/2018  . CAD (coronary artery disease) 08/03/2017  . Post herpetic neuralgia 07/29/2016  . Dizziness 01/02/2016  . Chronic midline low back pain without sciatica 06/06/2015  . GERD (gastroesophageal reflux disease) 11/15/2014  . Seizure disorder ([place]) 05/07/2014  . Allergic rhinitis 05/07/2014  . Routine general medical examination at a health care facility 05/07/2014    Scot Jun, PTA 04/25/2019, 1:52 PM  [place], [place], [address] Phone: [phone]   Fax:  [phone]  Name: Nicholas Rasmussen MRN: [id number] Date of Birth: 1946-03-02

## 2019-04-26 ENCOUNTER — Encounter: Payer: PPO | Admitting: Physical Therapy

## 2019-04-26 ENCOUNTER — Encounter: Payer: Self-pay | Admitting: Physical Therapy

## 2019-04-26 ENCOUNTER — Ambulatory Visit: Payer: PPO | Admitting: Physical Therapy

## 2019-04-26 DIAGNOSIS — M545 Low back pain, unspecified: Secondary | ICD-10-CM

## 2019-04-26 DIAGNOSIS — M62830 Muscle spasm of back: Secondary | ICD-10-CM

## 2019-04-26 NOTE — Therapy (Signed)
[place], [place], [address] Phone: [phone]   Fax:  [phone]  Physical Therapy Treatment  Patient Details  Name: Nicholas Rasmussen MRN: [id number] Date of Birth: 1946-04-11 Referring Provider (PT): Nche   Encounter Date: 04/26/2019  PT End of Session - 04/26/19 0924    Visit Number  9    Date for PT Re-Evaluation  05/26/19    PT Start Time  0845    PT Stop Time  0929    PT Time Calculation (min)  44 min    Activity Tolerance  Patient tolerated treatment well    Behavior During Therapy  [facility] for tasks assessed/performed       Past Medical History:  Diagnosis Date  . Allergic rhinitis 05/07/2014  . Dizziness 01/02/2016  . GERD (gastroesophageal reflux disease)   . Headache 03/13/2015  . Left flank pain 01/02/2016  . Low back pain 06/06/2015  . Pain of left side of body 08/13/2015  . Post herpetic neuralgia   . Routine general medical examination at a health care facility 05/07/2014  . Seizure disorder ([place]) 05/07/2014  . Seizures ([place])    Last seizure in the 70s.  . Shingles   . Stye 05/07/2014    Past Surgical History:  Procedure Laterality Date  . COLONOSCOPY    . KNEE SURGERY Left    left miniscus  . UPPER GASTROINTESTINAL ENDOSCOPY    . uvala surgery      There were no vitals filed for this visit.  Subjective Assessment - 04/26/19 0852    Subjective  "Sore from yesterday"    Currently in Pain?  No/denies                       [facility] Adult PT Treatment/Exercise - 04/26/19 0001      Lumbar Exercises: Aerobic   Elliptical  I10 R5 x 2 min each      UBE (Upper Arm Bike)  L4 2 min each       Lumbar Exercises: Machines for Strengthening   Other Lumbar Machine Exercise  seated Rows and Lats 45lb 2x10       Lumbar Exercises: Standing   Shoulder Extension  Both;Strengthening;20 reps    Shoulder Extension Limitations  15lb     Other Standing Lumbar  Exercises  Overhead Ext blue ball 2x10    Other Standing Lumbar Exercises  Hip ext and abd 10lb x 10 each       Lumbar Exercises: Seated   Sit to Stand  10 reps   x2 holding yellow ball               PT Short Term Goals - 02/27/19 1343      PT SHORT TERM GOAL #1   Title  independent with initial HEP    Status  Achieved        PT Long Term Goals - 04/17/19 1519      PT LONG TERM GOAL #1   Title  understand posture and body mechanics    Status  Achieved      PT LONG TERM GOAL #2   Title  decrease pain 50%    Status  Partially Met      PT LONG TERM GOAL #3   Title  report able to lie down without pain    Status  Achieved            Plan -  04/26/19 0926    Clinical Impression Statement  Pt did well overall, some soreness reported from yesterdays session so interventions scald back. Fatigue noted with sit to stand. Cues to keep shoulder down with shoulder extensions. No reports of pain, pt is progressing well.    Stability/Clinical Decision Making  Stable/Uncomplicated    Rehab Potential  Good    PT Frequency  2x / week    PT Duration  8 weeks    PT Treatment/Interventions  ADLs/Self Care Home Management;Electrical Stimulation;Moist Heat;Traction;Ultrasound;Therapeutic activities;Therapeutic exercise;Manual techniques;Patient/family education    PT Next Visit Plan  continue core exercises       Patient will benefit from skilled therapeutic intervention in order to improve the following deficits and impairments:  Improper body mechanics, Pain, Postural dysfunction, Increased muscle spasms, Decreased range of motion, Decreased strength, Impaired flexibility  Visit Diagnosis: Acute bilateral low back pain without sciatica  Muscle spasm of back     Problem List Patient Active Problem List   Diagnosis Date Noted  . Varicose veins of both lower extremities with pain 02/02/2019  . Encounter for therapeutic drug level monitoring 09/19/2018  . Type 2  diabetes mellitus with diabetic polyneuropathy, without long-term current use of insulin ([place]) 03/29/2018  . Diabetic neuropathy ([place]) 03/29/2018  . CAD (coronary artery disease) 08/03/2017  . Post herpetic neuralgia 07/29/2016  . Dizziness 01/02/2016  . Chronic midline low back pain without sciatica 06/06/2015  . GERD (gastroesophageal reflux disease) 11/15/2014  . Seizure disorder ([place]) 05/07/2014  . Allergic rhinitis 05/07/2014  . Routine general medical examination at a health care facility 05/07/2014    Scot Jun, PTA 04/26/2019, 9:29 AM  [place], [place], [address] Phone: [phone]   Fax:  [phone]  Name: Dimitriy Carreras MRN: [id number] Date of Birth: September 03, 1945

## 2019-05-01 ENCOUNTER — Other Ambulatory Visit: Payer: Self-pay

## 2019-05-01 ENCOUNTER — Ambulatory Visit: Payer: PPO | Admitting: Physical Therapy

## 2019-05-01 ENCOUNTER — Encounter: Payer: Self-pay | Admitting: Physical Therapy

## 2019-05-01 DIAGNOSIS — M545 Low back pain, unspecified: Secondary | ICD-10-CM

## 2019-05-01 DIAGNOSIS — M62830 Muscle spasm of back: Secondary | ICD-10-CM

## 2019-05-01 NOTE — Therapy (Addendum)
[place], [place], [address] Phone: [phone]   Fax:  [phone] Progress Note Reporting Period 02/20/19 to 05/01/19 for the first 10 visits   See note below for Objective Data and Assessment of Progress/Goals.      Physical Therapy Treatment  Patient Details  Name: Nicholas Rasmussen MRN: [id number] Date of Birth: [birth year] Referring Provider (PT): Nche   Encounter Date: 05/01/2019  PT End of Session - 05/01/19 1606    Visit Number  10    Date for PT Re-Evaluation  05/26/19    PT Start Time  1520    PT Stop Time  1600    PT Time Calculation (min)  40 min    Activity Tolerance  Patient tolerated treatment well    Behavior During Therapy  [facility] for tasks assessed/performed       Past Medical History:  Diagnosis Date  . Allergic rhinitis 05/07/2014  . Dizziness 01/02/2016  . GERD (gastroesophageal reflux disease)   . Headache 03/13/2015  . Left flank pain 01/02/2016  . Low back pain 06/06/2015  . Pain of left side of body 08/13/2015  . Post herpetic neuralgia   . Routine general medical examination at a health care facility 05/07/2014  . Seizure disorder ([place]) 05/07/2014  . Seizures ([place])    Last seizure in the 70s.  . Shingles   . Stye 05/07/2014    Past Surgical History:  Procedure Laterality Date  . COLONOSCOPY    . KNEE SURGERY Left    left miniscus  . UPPER GASTROINTESTINAL ENDOSCOPY    . uvala surgery      There were no vitals filed for this visit.  Subjective Assessment - 05/01/19 1524    Subjective  "Doing pretty good" Pt reports some improvement    Pain Score  3     Pain Location  Back                       [facility] Adult PT Treatment/Exercise - 05/01/19 0001      Lumbar Exercises: Aerobic   Elliptical  I10 R5 x 2 min each      UBE (Upper Arm Bike)  L4.5 2 min each       Lumbar Exercises: Machines for Strengthening   Cybex Knee Extension  20lb  2x15    Cybex Knee Flexion  45 2x15       Lumbar Exercises: Standing   Shoulder Extension  Both;Strengthening;20 reps    Shoulder Extension Limitations  20    Other Standing Lumbar Exercises  Overhead Ext blue ball 2x10    Other Standing Lumbar Exercises  Ar press 35lb x 10 each,       Lumbar Exercises: Supine   Other Supine Lumbar Exercises  LE on [place] small bridges, K2C, oblq x10      Lumbar Exercises: Quadruped   Plank  4 x 10 sec                PT Short Term Goals - 02/27/19 1343      PT SHORT TERM GOAL #1   Title  independent with initial HEP    Status  Achieved        PT Long Term Goals - 04/17/19 1519      PT LONG TERM GOAL #1   Title  understand posture and body mechanics    Status  Achieved      PT  LONG TERM GOAL #2   Title  decrease pain 50%    Status  Partially Met      PT LONG TERM GOAL #3   Title  report able to lie down without pain    Status  Achieved            Plan - 05/01/19 1608    Clinical Impression Statement  Pt reports some low back improvement. 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 No reports of increase pain. AR pressed remain difficult for pt. He did well with prone planks but this was taxing on him. Cues to keep core engaged with bridges on [place].    Stability/Clinical Decision Making  Stable/Uncomplicated    Rehab Potential  Good    PT Frequency  2x / week    PT Duration  8 weeks    PT Treatment/Interventions  ADLs/Self Care Home Management;Electrical Stimulation;Moist Heat;Traction;Ultrasound;Therapeutic activities;Therapeutic exercise;Manual techniques;Patient/family education    PT Next Visit Plan  continue core exercises       Patient will benefit from skilled therapeutic intervention in order to improve the following deficits and impairments:  Improper body mechanics, Pain, Postural dysfunction, Increased muscle spasms, Decreased range of motion, Decreased strength, Impaired  flexibility  Visit Diagnosis: Muscle spasm of back  Acute bilateral low back pain without sciatica     Problem List Patient Active Problem List   Diagnosis Date Noted  . Varicose veins of both lower extremities with pain 02/02/2019  . Encounter for therapeutic drug level monitoring 09/19/2018  . Type 2 diabetes mellitus with diabetic polyneuropathy, without long-term current use of insulin ([place]) 03/29/2018  . Diabetic neuropathy ([place]) 03/29/2018  . CAD (coronary artery disease) 08/03/2017  . Post herpetic neuralgia 07/29/2016  . Dizziness 01/02/2016  . Chronic midline low back pain without sciatica 06/06/2015  . GERD (gastroesophageal reflux disease) 11/15/2014  . Seizure disorder ([place]) 05/07/2014  . Allergic rhinitis 05/07/2014  . Routine general medical examination at a health care facility 05/07/2014    Scot Jun, PTA 05/01/2019, 4:15 PM  [place], [place], [address] Phone: [phone]   Fax:  [phone]  Name: Nicholas Rasmussen MRN: [id number] Date of Birth: 27-Jul-1945

## 2019-05-03 ENCOUNTER — Ambulatory Visit: Payer: PPO | Admitting: Physical Therapy

## 2019-05-03 ENCOUNTER — Other Ambulatory Visit: Payer: Self-pay

## 2019-05-03 ENCOUNTER — Encounter: Payer: Self-pay | Admitting: Physical Therapy

## 2019-05-03 DIAGNOSIS — M62830 Muscle spasm of back: Secondary | ICD-10-CM

## 2019-05-03 DIAGNOSIS — M545 Low back pain, unspecified: Secondary | ICD-10-CM

## 2019-05-03 NOTE — Therapy (Signed)
[place], [place], [address] Phone: [phone]   Fax:  [phone]  Physical Therapy Treatment  Patient Details  Name: Nicholas Rasmussen MRN: [id number] Date of Birth: 1945-11-08 Referring Provider (PT): Nche   Encounter Date: 05/03/2019  PT End of Session - 05/03/19 1605    Visit Number  11    Date for PT Re-Evaluation  05/26/19    PT Start Time  9509    PT Stop Time  1600    PT Time Calculation (min)  45 min    Activity Tolerance  Patient tolerated treatment well    Behavior During Therapy  [facility] for tasks assessed/performed       Past Medical History:  Diagnosis Date  . Allergic rhinitis 05/07/2014  . Dizziness 01/02/2016  . GERD (gastroesophageal reflux disease)   . Headache 03/13/2015  . Left flank pain 01/02/2016  . Low back pain 06/06/2015  . Pain of left side of body 08/13/2015  . Post herpetic neuralgia   . Routine general medical examination at a health care facility 05/07/2014  . Seizure disorder ([place]) 05/07/2014  . Seizures ([place])    Last seizure in the 70s.  . Shingles   . Stye 05/07/2014    Past Surgical History:  Procedure Laterality Date  . COLONOSCOPY    . KNEE SURGERY Left    left miniscus  . UPPER GASTROINTESTINAL ENDOSCOPY    . uvala surgery      There were no vitals filed for this visit.  Subjective Assessment - 05/03/19 1520    Subjective  "Doing pretty good"    Currently in Pain?  No/denies                       [facility] Adult PT Treatment/Exercise - 05/03/19 0001      Lumbar Exercises: Aerobic   Elliptical  I15 R 7 2 min each way     Nustep  L6 x6 min       Lumbar Exercises: Machines for Strengthening   Cybex Knee Extension  25lb 2x15    Cybex Knee Flexion  55 2x15     Leg Press  60lb 2x15; 50lb SL 2x10 each     Other Lumbar Machine Exercise  seated Rows and Lats 45lb 2x15      Lumbar Exercises: Standing   Other Standing Lumbar  Exercises  straight arm pull downs 55lb 2x10;      Lumbar Exercises: Quadruped   Plank  3 x15 sec                 PT Short Term Goals - 02/27/19 1343      PT SHORT TERM GOAL #1   Title  independent with initial HEP    Status  Achieved        PT Long Term Goals - 04/17/19 1519      PT LONG TERM GOAL #1   Title  understand posture and body mechanics    Status  Achieved      PT LONG TERM GOAL #2   Title  decrease pain 50%    Status  Partially Met      PT LONG TERM GOAL #3   Title  report able to lie down without pain    Status  Achieved            Plan - 05/03/19 1608    Clinical Impression Statement  Pt tolerated  today's treatment well evident by no subjective reports of increase pain. Increase weight tolerated on machine level interventions without issue. Some signs of fatigue noted with the increase time doing prone planks. He continues to report improvement overall when caring for his brother.    Stability/Clinical Decision Making  Stable/Uncomplicated    Rehab Potential  Good    PT Frequency  2x / week    PT Treatment/Interventions  ADLs/Self Care Home Management;Electrical Stimulation;Moist Heat;Traction;Ultrasound;Therapeutic activities;Therapeutic exercise;Manual techniques;Patient/family education    PT Next Visit Plan  continue core exercises       Patient will benefit from skilled therapeutic intervention in order to improve the following deficits and impairments:  Improper body mechanics, Pain, Postural dysfunction, Increased muscle spasms, Decreased range of motion, Decreased strength, Impaired flexibility  Visit Diagnosis: Muscle spasm of back  Acute bilateral low back pain without sciatica     Problem List Patient Active Problem List   Diagnosis Date Noted  . Varicose veins of both lower extremities with pain 02/02/2019  . Encounter for therapeutic drug level monitoring 09/19/2018  . Type 2 diabetes mellitus with diabetic  polyneuropathy, without long-term current use of insulin ([place]) 03/29/2018  . Diabetic neuropathy ([place]) 03/29/2018  . CAD (coronary artery disease) 08/03/2017  . Post herpetic neuralgia 07/29/2016  . Dizziness 01/02/2016  . Chronic midline low back pain without sciatica 06/06/2015  . GERD (gastroesophageal reflux disease) 11/15/2014  . Seizure disorder ([place]) 05/07/2014  . Allergic rhinitis 05/07/2014  . Routine general medical examination at a health care facility 05/07/2014    Scot Jun, PTA 05/03/2019, 4:17 PM  [place], [place], [address] Phone: [phone]   Fax:  [phone]  Name: Nicholas Rasmussen MRN: [id number] Date of Birth: 1945-12-13

## 2019-05-08 ENCOUNTER — Other Ambulatory Visit: Payer: Self-pay

## 2019-05-08 ENCOUNTER — Encounter: Payer: Self-pay | Admitting: Physical Therapy

## 2019-05-08 ENCOUNTER — Ambulatory Visit: Payer: PPO | Admitting: Physical Therapy

## 2019-05-08 DIAGNOSIS — M545 Low back pain, unspecified: Secondary | ICD-10-CM

## 2019-05-08 DIAGNOSIS — M62830 Muscle spasm of back: Secondary | ICD-10-CM

## 2019-05-08 NOTE — Therapy (Signed)
[place], [place], [address] Phone: [phone]   Fax:  [phone]  Physical Therapy Treatment  Patient Details  Name: Nicholas Rasmussen MRN: [id number] Date of Birth: [birth year] Referring Provider (PT): Nche   Encounter Date: 05/08/2019  PT End of Session - 05/08/19 1340    Visit Number  12    Date for PT Re-Evaluation  05/26/19    PT Start Time  1300    PT Stop Time  1345    PT Time Calculation (min)  45 min    Activity Tolerance  Patient tolerated treatment well    Behavior During Therapy  [facility] for tasks assessed/performed       Past Medical History:  Diagnosis Date  . Allergic rhinitis 05/07/2014  . Dizziness 01/02/2016  . GERD (gastroesophageal reflux disease)   . Headache 03/13/2015  . Left flank pain 01/02/2016  . Low back pain 06/06/2015  . Pain of left side of body 08/13/2015  . Post herpetic neuralgia   . Routine general medical examination at a health care facility 05/07/2014  . Seizure disorder ([place]) 05/07/2014  . Seizures ([place])    Last seizure in the 70s.  . Shingles   . Stye 05/07/2014    Past Surgical History:  Procedure Laterality Date  . COLONOSCOPY    . KNEE SURGERY Left    left miniscus  . UPPER GASTROINTESTINAL ENDOSCOPY    . uvala surgery      There were no vitals filed for this visit.  Subjective Assessment - 05/08/19 1303    Subjective  85%improvement overall since starting therapy    Currently in Pain?  No/denies                       [facility] Adult PT Treatment/Exercise - 05/08/19 0001      Lumbar Exercises: Aerobic   Elliptical  I15 R 7 2 min each way     Nustep  L6 x6 min       Lumbar Exercises: Machines for Strengthening   Cybex Knee Extension  25lb 2x15    Cybex Knee Flexion  55 2x15     Leg Press  70lb 2x15; 50lb SL 2x10 each     Other Lumbar Machine Exercise  Chest press 35lb 2x10     Other Lumbar Machine Exercise  seated  Rows and Lats 55lb 2x10      Lumbar Exercises: Standing   Other Standing Lumbar Exercises  Overhead Ext blue ball 2x10    Other Standing Lumbar Exercises  straight arm pull downs 55lb 2x10;      Lumbar Exercises: Quadruped   Plank  3 x sec      Other Quadruped Lumbar Exercises  3 x 20 sec               PT Short Term Goals - 02/27/19 1343      PT SHORT TERM GOAL #1   Title  independent with initial HEP    Status  Achieved        PT Long Term Goals - 04/17/19 1519      PT LONG TERM GOAL #1   Title  understand posture and body mechanics    Status  Achieved      PT LONG TERM GOAL #2   Title  decrease pain 50%    Status  Partially Met      PT LONG TERM GOAL #3  Title  report able to lie down without pain    Status  Achieved            Plan - 05/08/19 1340    Clinical Impression Statement  Pt is doing really well and continues to report improvement. Decrease LBP overall, he still cares for his brother who is a below knee amputee. He was able to complete all intervention with increase weight and or intensities. Cues to bring hip up with planks. tactile cues for posture needed with straight arm pull downs.    Stability/Clinical Decision Making  Stable/Uncomplicated    PT Frequency  2x / week    PT Duration  8 weeks    PT Treatment/Interventions  ADLs/Self Care Home Management;Electrical Stimulation;Moist Heat;Traction;Ultrasound;Therapeutic activities;Therapeutic exercise;Manual techniques;Patient/family education    PT Next Visit Plan  continue core exercises       Patient will benefit from skilled therapeutic intervention in order to improve the following deficits and impairments:  Improper body mechanics, Pain, Postural dysfunction, Increased muscle spasms, Decreased range of motion, Decreased strength, Impaired flexibility  Visit Diagnosis: Acute bilateral low back pain without sciatica  Muscle spasm of back     Problem List Patient Active Problem  List   Diagnosis Date Noted  . Varicose veins of both lower extremities with pain 02/02/2019  . Encounter for therapeutic drug level monitoring 09/19/2018  . Type 2 diabetes mellitus with diabetic polyneuropathy, without long-term current use of insulin ([place]) 03/29/2018  . Diabetic neuropathy ([place]) 03/29/2018  . CAD (coronary artery disease) 08/03/2017  . Post herpetic neuralgia 07/29/2016  . Dizziness 01/02/2016  . Chronic midline low back pain without sciatica 06/06/2015  . GERD (gastroesophageal reflux disease) 11/15/2014  . Seizure disorder ([place]) 05/07/2014  . Allergic rhinitis 05/07/2014  . Routine general medical examination at a health care facility 05/07/2014    Scot Jun, PTA 05/08/2019, 1:43 PM  [place], [place], [address] Phone: [phone]   Fax:  [phone]  Name: Nicholas Rasmussen MRN: [id number] Date of Birth: 05/13/1946

## 2019-05-10 ENCOUNTER — Ambulatory Visit: Payer: PPO | Admitting: Physical Therapy

## 2019-05-10 ENCOUNTER — Encounter: Payer: Self-pay | Admitting: Physical Therapy

## 2019-05-10 ENCOUNTER — Other Ambulatory Visit: Payer: Self-pay

## 2019-05-10 DIAGNOSIS — M545 Low back pain, unspecified: Secondary | ICD-10-CM

## 2019-05-10 DIAGNOSIS — M62830 Muscle spasm of back: Secondary | ICD-10-CM

## 2019-05-10 NOTE — Therapy (Addendum)
[place], [place], [address] Phone: [phone]   Fax:  [phone]  Physical Therapy Treatment  Patient Details  Name: Nicholas Rasmussen MRN: [id number] Date of Birth: [birth year] Referring Provider (PT): Nche   Encounter Date: 05/10/2019    Past Medical History:  Diagnosis Date  . Allergic rhinitis 05/07/2014  . Dizziness 01/02/2016  . GERD (gastroesophageal reflux disease)   . Headache 03/13/2015  . Left flank pain 01/02/2016  . Low back pain 06/06/2015  . Pain of left side of body 08/13/2015  . Post herpetic neuralgia   . Routine general medical examination at a health care facility 05/07/2014  . Seizure disorder ([place]) 05/07/2014  . Seizures ([place])    Last seizure in the 70s.  . Shingles   . Stye 05/07/2014    Past Surgical History:  Procedure Laterality Date  . COLONOSCOPY    . KNEE SURGERY Left    left miniscus  . UPPER GASTROINTESTINAL ENDOSCOPY    . uvala surgery      There were no vitals filed for this visit.  Subjective Assessment - 05/10/19 1303    Subjective  "I am good"    Currently in Pain?  No/denies                       [facility] Adult PT Treatment/Exercise - 05/10/19 0001      Lumbar Exercises: Aerobic   Elliptical  I17 R 9 2 min each way     Nustep  L6 x6 min       Lumbar Exercises: Machines for Strengthening   Cybex Knee Extension  35lb 2x10    Cybex Knee Flexion  65 2x10     Other Lumbar Machine Exercise  Chest press 35lb 2x15    Other Lumbar Machine Exercise  seated Rows and Lats 55lb 2x10      Lumbar Exercises: Standing   Other Standing Lumbar Exercises  Ar press 35lb x 10 each,       Lumbar Exercises: Seated   Sit to Stand  20 reps   from blue chair, OHP with blue ball      Lumbar Exercises: Quadruped   Plank  3 x 30 sec               PT Short Term Goals - 02/27/19 1343      PT SHORT TERM GOAL #1   Title  independent  with initial HEP    Status  Achieved        PT Long Term Goals - 05/10/19 1338      PT LONG TERM GOAL #2   Title  decrease pain 50%    Status  Partially Met            Plan - 05/10/19 1338    Clinical Impression Statement  Treatment interventions progressed with weight and or reps. Good control with prone planks but does demo signs of weakness. Tactile cues not to protract shoulders with seated rows. Overall Pt is doing well    Stability/Clinical Decision Making  Stable/Uncomplicated    Rehab Potential  Good    PT Frequency  2x / week    PT Treatment/Interventions  ADLs/Self Care Home Management;Electrical Stimulation;Moist Heat;Traction;Ultrasound;Therapeutic activities;Therapeutic exercise;Manual techniques;Patient/family education    PT Next Visit Plan  continue core exercises       Patient will benefit from skilled therapeutic intervention in order to improve the following deficits  and impairments:  Improper body mechanics, Pain, Postural dysfunction, Increased muscle spasms, Decreased range of motion, Decreased strength, Impaired flexibility  Visit Diagnosis: Muscle spasm of back  Acute bilateral low back pain without sciatica     Problem List Patient Active Problem List   Diagnosis Date Noted  . Varicose veins of both lower extremities with pain 02/02/2019  . Encounter for therapeutic drug level monitoring 09/19/2018  . Type 2 diabetes mellitus with diabetic polyneuropathy, without long-term current use of insulin ([place]) 03/29/2018  . Diabetic neuropathy ([place]) 03/29/2018  . CAD (coronary artery disease) 08/03/2017  . Post herpetic neuralgia 07/29/2016  . Dizziness 01/02/2016  . Chronic midline low back pain without sciatica 06/06/2015  . GERD (gastroesophageal reflux disease) 11/15/2014  . Seizure disorder ([place]) 05/07/2014  . Allergic rhinitis 05/07/2014  . Routine general medical examination at a health care facility 05/07/2014    Scot Jun,  PTA 05/10/2019, 1:40 PM  [name], [place], [address] Phone: [phone]   Fax:  [phone]  Name: Nicholas Rasmussen MRN: [id number] Date of Birth: 13-Jun-1945

## 2019-05-14 ENCOUNTER — Encounter: Payer: Self-pay | Admitting: Physical Therapy

## 2019-05-14 ENCOUNTER — Other Ambulatory Visit: Payer: Self-pay

## 2019-05-14 ENCOUNTER — Ambulatory Visit: Payer: PPO | Admitting: Physical Therapy

## 2019-05-14 DIAGNOSIS — M545 Low back pain, unspecified: Secondary | ICD-10-CM

## 2019-05-14 DIAGNOSIS — M62830 Muscle spasm of back: Secondary | ICD-10-CM

## 2019-05-14 NOTE — Therapy (Addendum)
[place] [address] [place], [place], [address] Phone: [phone]   Fax:  [phone]  Physical Therapy Treatment  Patient Details  Name: Nicholas Rasmussen MRN: [id number] Date of Birth: Aug 14, 1945 Referring Provider (PT): Nche   Encounter Date: 05/14/2019  PT End of Session - 05/14/19 1510    Visit Number  13    Date for PT Re-Evaluation  05/26/19    PT Start Time  8588    PT Stop Time  1515    PT Time Calculation (min)  36 min    Activity Tolerance  Patient tolerated treatment well    Behavior During Therapy  [facility] for tasks assessed/performed       Past Medical History:  Diagnosis Date  . Allergic rhinitis 05/07/2014  . Dizziness 01/02/2016  . GERD (gastroesophageal reflux disease)   . Headache 03/13/2015  . Left flank pain 01/02/2016  . Low back pain 06/06/2015  . Pain of left side of body 08/13/2015  . Post herpetic neuralgia   . Routine general medical examination at a health care facility 05/07/2014  . Seizure disorder ([place]) 05/07/2014  . Seizures ([place])    Last seizure in the 70s.  . Shingles   . Stye 05/07/2014    Past Surgical History:  Procedure Laterality Date  . COLONOSCOPY    . KNEE SURGERY Left    left miniscus  . UPPER GASTROINTESTINAL ENDOSCOPY    . uvala surgery      There were no vitals filed for this visit.  Subjective Assessment - 05/14/19 1441    Subjective  "I am good"    Currently in Pain?  No/denies         [facility] PT Assessment - 05/14/19 0001      AROM   Overall AROM   Within functional limits for tasks performed      Strength   Overall Strength  Within functional limits for tasks performed                   [facility] Adult PT Treatment/Exercise - 05/14/19 0001      Lumbar Exercises: Aerobic   Elliptical  I17 R 9 2.5 min each way       Lumbar Exercises: Machines for Strengthening   Cybex Knee Extension  35lb 2x10    Cybex Knee Flexion  65 2x10     Leg Press  80lb 2x15     Other Lumbar Machine Exercise  seated Rows and Lats 55lb 2x15      Lumbar Exercises: Standing   Other Standing Lumbar Exercises  AR press 45lb x 10 each    Other Standing Lumbar Exercises  straight arm pull downs 55lb 2x10;      Lumbar Exercises: Quadruped   Plank  3 x 30 sec               PT Short Term Goals - 02/27/19 1343      PT SHORT TERM GOAL #1   Title  independent with initial HEP    Status  Achieved        PT Long Term Goals - 05/14/19 1512      PT LONG TERM GOAL #1   Title  understand posture and body mechanics    Status  Achieved      PT LONG TERM GOAL #2   Title  decrease pain 50%    Status  Achieved      PT LONG TERM  GOAL #3   Title  report able to lie down without pain    Status  Achieved            Plan - 05/14/19 1510    Clinical Impression Statement  Pt ` 9 minutes late. Pt reports that he is good and  feeling a lot better. No issues with today's interventions, reports no functional limitations at home. He is pleased with his current functional status. All goals met.    Stability/Clinical Decision Making  Stable/Uncomplicated    Rehab Potential  Good    PT Frequency  2x / week    PT Duration  8 weeks    PT Treatment/Interventions  ADLs/Self Care Home Management;Electrical Stimulation;Moist Heat;Traction;Ultrasound;Therapeutic activities;Therapeutic exercise;Manual techniques;Patient/family education    PT Next Visit Plan  continue core exercises       Patient will benefit from skilled therapeutic intervention in order to improve the following deficits and impairments:  Improper body mechanics, Pain, Postural dysfunction, Increased muscle spasms, Decreased range of motion, Decreased strength, Impaired flexibility  Visit Diagnosis: Acute bilateral low back pain without sciatica  Muscle spasm of back     Problem List Patient Active Problem List   Diagnosis Date Noted  . Varicose veins of both lower  extremities with pain 02/02/2019  . Encounter for therapeutic drug level monitoring 09/19/2018  . Type 2 diabetes mellitus with diabetic polyneuropathy, without long-term current use of insulin ([place]) 03/29/2018  . Diabetic neuropathy ([place]) 03/29/2018  . CAD (coronary artery disease) 08/03/2017  . Post herpetic neuralgia 07/29/2016  . Dizziness 01/02/2016  . Chronic midline low back pain without sciatica 06/06/2015  . GERD (gastroesophageal reflux disease) 11/15/2014  . Seizure disorder ([place]) 05/07/2014  . Allergic rhinitis 05/07/2014  . Routine general medical examination at a health care facility 05/07/2014   PHYSICAL THERAPY DISCHARGE SUMMARY  Visits from Start of Care: 13 Plan: Patient agrees to discharge.  Patient goals were met. Patient is being discharged due to meeting the stated rehab goals.  ?????     Scot Jun, PTA 05/14/2019, 3:12 PM  [place] [address] [address] [place], [place], [address] Phone: [phone]   Fax:  [phone]  Name: Nicholas Rasmussen MRN: [id number] Date of Birth: [birth year]

## 2019-05-31 ENCOUNTER — Ambulatory Visit: Payer: PPO | Attending: Internal Medicine

## 2019-05-31 DIAGNOSIS — Z20822 Contact with and (suspected) exposure to covid-19: Secondary | ICD-10-CM | POA: Diagnosis not present

## 2019-06-02 LAB — NOVEL CORONAVIRUS, NAA: SARS-CoV-2, NAA: NOT DETECTED

## 2019-07-04 ENCOUNTER — Ambulatory Visit: Payer: PPO | Attending: Internal Medicine

## 2019-07-04 DIAGNOSIS — Z20822 Contact with and (suspected) exposure to covid-19: Secondary | ICD-10-CM | POA: Diagnosis not present

## 2019-07-05 LAB — NOVEL CORONAVIRUS, NAA: SARS-CoV-2, NAA: NOT DETECTED

## 2019-07-18 ENCOUNTER — Other Ambulatory Visit: Payer: Self-pay

## 2019-07-18 ENCOUNTER — Telehealth (INDEPENDENT_AMBULATORY_CARE_PROVIDER_SITE_OTHER): Payer: PPO | Admitting: Nurse Practitioner

## 2019-07-18 ENCOUNTER — Encounter: Payer: Self-pay | Admitting: Nurse Practitioner

## 2019-07-18 VITALS — BP 125/66 | HR 53 | Ht 75.5 in | Wt 238.0 lb

## 2019-07-18 DIAGNOSIS — K2100 Gastro-esophageal reflux disease with esophagitis, without bleeding: Secondary | ICD-10-CM | POA: Diagnosis not present

## 2019-07-18 MED ORDER — OMEPRAZOLE 20 MG PO CPDR: 20.0000 mg | DELAYED_RELEASE_CAPSULE | Freq: Every day | ORAL | 3 refills | Status: DC | PRN

## 2019-07-18 NOTE — Progress Notes (Signed)
Virtual Visit via Video Note  I connected with@ on 07/18/19 at 12:30 PM EST by a video enabled telemedicine application and verified that I am speaking with the correct person using two identifiers.  Location: Patient:Home Provider: Office Participants: patient and provider  I discussed the limitations of evaluation and management by telemedicine and the availability of in person appointments. I also discussed with the patient that there may be a patient responsible charge related to this service. The patient expressed understanding and agreed to proceed.  [id number] throat  History of Present Illness: Sore Throat  This is a new problem. The current episode started yesterday. The problem has been resolved. There has been no fever. The patient is experiencing no pain. Pertinent negatives include no abdominal pain, congestion, coughing, diarrhea, drooling, ear discharge, ear pain, headaches, hoarse voice, plugged ear sensation, neck pain, shortness of breath, stridor, swollen glands, trouble swallowing or vomiting. He has had no exposure to strep or mono. He has tried gargles for the symptoms. The treatment provided significant relief.   Observations/Objective: Physical Exam  Constitutional: He is oriented to person, place, and time.  Pulmonary/Chest: Effort normal.  Musculoskeletal:     Cervical back: Neck supple.  Neurological: He is alert and oriented to person, place, and time.   Assessment and Plan: Declan was seen today for pain.  Diagnoses and all orders for this visit:  Gastroesophageal reflux disease with esophagitis without hemorrhage -     omeprazole (PRILOSEC) 20 MG capsule; Take 1 capsule (20 mg total) by mouth daily as needed.   Follow Up Instructions: See avs   I discussed the assessment and treatment plan with the patient. The patient was provided an opportunity to ask questions and all were answered. The patient agreed with the plan and demonstrated an understanding  of the instructions.   The patient was advised to call back or seek an in-person evaluation if the symptoms worsen or if the condition fails to improve as anticipated.   [name], NP

## 2019-07-22 ENCOUNTER — Ambulatory Visit: Payer: PPO | Attending: Internal Medicine

## 2019-07-22 DIAGNOSIS — Z23 Encounter for immunization: Secondary | ICD-10-CM | POA: Insufficient documentation

## 2019-07-22 NOTE — Progress Notes (Signed)
   Covid-19 Vaccination Clinic  Name:  Nicholas Rasmussen    MRN: [id number] DOB: 1945/06/09  07/22/2019  [name] was observed post Covid-19 immunization for 15 minutes without incidence. He was provided with Vaccine Information Sheet and instruction to access the V-Safe system.   [name] was instructed to call 911 with any severe reactions post vaccine: [place] Kitchen Difficulty breathing  . Swelling of your face and throat  . A fast heartbeat  . A bad rash all over your body  . Dizziness and weakness    Immunizations Administered    Name Date Dose VIS Date Route   Pfizer COVID-19 Vaccine 07/22/2019 10:48 AM 0.3 mL 05/04/2019 Intramuscular   Manufacturer: [place]   Lot: [id number]   [place]: [id number]

## 2019-08-21 ENCOUNTER — Ambulatory Visit: Payer: PPO | Attending: Internal Medicine

## 2019-08-21 DIAGNOSIS — Z23 Encounter for immunization: Secondary | ICD-10-CM

## 2019-08-21 NOTE — Progress Notes (Signed)
   Covid-19 Vaccination Clinic  Name:  Nicholas Rasmussen    MRN: [id number] DOB: 03-10-1946  08/21/2019  [name] was observed post Covid-19 immunization for 15 minutes without incident. He was provided with Vaccine Information Sheet and instruction to access the V-Safe system.   [name] was instructed to call 911 with any severe reactions post vaccine: Mar[place] Kitchen Difficulty breathing  . Swelling of face and throat  . A fast heartbeat  . A bad rash all over body  . Dizziness and weakness   Immunizations Administered    Name Date Dose VIS Date Route   Pfizer COVID-19 Vaccine 08/21/2019 11:02 AM 0.3 mL 05/04/2019 Intramuscular   Manufacturer: [place]   Lot: [id number]   [place]: [id number]

## 2019-08-22 ENCOUNTER — Telehealth (INDEPENDENT_AMBULATORY_CARE_PROVIDER_SITE_OTHER): Payer: PPO | Admitting: Nurse Practitioner

## 2019-08-22 ENCOUNTER — Other Ambulatory Visit: Payer: Self-pay

## 2019-08-22 ENCOUNTER — Encounter: Payer: Self-pay | Admitting: Nurse Practitioner

## 2019-08-22 VITALS — BP 122/69 | HR 55 | Temp 97.1°F | Ht 75.5 in | Wt 240.0 lb

## 2019-08-22 DIAGNOSIS — K2100 Gastro-esophageal reflux disease with esophagitis, without bleeding: Secondary | ICD-10-CM

## 2019-08-22 DIAGNOSIS — R519 Headache, unspecified: Secondary | ICD-10-CM

## 2019-08-22 NOTE — Progress Notes (Signed)
Virtual Visit via Video Note  I connected with@ on 08/22/19 at 12:30 PM EDT by a video enabled telemedicine application and verified that I am speaking with the correct person using two identifiers.  Location: Patient:Home Provider: Office Participants: patient and provider  I discussed the limitations of evaluation and management by telemedicine and the availability of in person appointments. I also discussed with the patient that there may be a patient responsible charge related to this service. The patient expressed understanding and agreed to proceed.  [id number] vaccine administered 08/21/2019, headache onset on 08/19/2019 then resolved 08/20/2019. pt is concern of headache--started Sunday comes and goes--pt is concern it might be from 2nd covid shot--is it okey to take gabapentin?will have vital sign to share during visit [place] mucus at times/going on for a long time/feel like allergy?  History of Present Illness: Headache  This is a new problem. The current episode started yesterday. The problem occurs constantly. The problem has been resolved. The pain is located in the frontal region. The pain does not radiate. The pain quality is not similar to prior headaches. The quality of the pain is described as aching. Associated symptoms include coughing. Pertinent negatives include no blurred vision, dizziness, drainage, ear pain, eye pain, eye redness, eye watering, fever, nausea, numbness, phonophobia, photophobia, rhinorrhea, scalp tenderness, seizures, sinus pressure, sore throat, tingling, tinnitus, visual change, vomiting, weakness or weight loss. The symptoms are aggravated by unknown. He has tried acetaminophen for the symptoms. The treatment provided significant relief. His past medical history is significant for hypertension. There is no history of migraine headaches, migraines in the family, obesity, recent head traumas, sinus disease or TMJ.  Cough This is a chronic problem. The  problem has been waxing and waning. The cough is productive of purulent sputum. Associated symptoms include headaches and heartburn. Pertinent negatives include no chest pain, chills, ear congestion, ear pain, eye redness, fever, hemoptysis, myalgias, nasal congestion, postnasal drip, rhinorrhea, sore throat, shortness of breath, sweats, weight loss or wheezing. Nothing aggravates the symptoms. He has tried nothing for the symptoms. There is no history of asthma, bronchitis, COPD, environmental allergies or pneumonia.   Observations/Objective: Physical Exam  Constitutional: He is oriented to person, place, and time. No distress.  Eyes: Pupils are equal, round, and reactive to light.  Cardiovascular: Normal rate.  Pulmonary/Chest: Effort normal.  Musculoskeletal:     Cervical back: Normal range of motion and neck supple.  Neurological: He is alert and oriented to person, place, and time.  Psychiatric: He has a normal mood and affect. His behavior is normal. Thought content normal.  Vitals reviewed.  Assessment and Plan: Nicholas Rasmussen was seen today for headache and cough.  Diagnoses and all orders for this visit:  Nonintractable episodic headache, unspecified headache type  Gastroesophageal reflux disease with esophagitis without hemorrhage   Follow Up Instructions: See avs   I discussed the assessment and treatment plan with the patient. The patient was provided an opportunity to ask questions and all were answered. The patient agreed with the plan and demonstrated an understanding of the instructions.   The patient was advised to call back or seek an in-person evaluation if the symptoms worsen or if the condition fails to improve as anticipated.  [name], NP

## 2019-08-22 NOTE — Patient Instructions (Addendum)
Use tylenol 500mg  as needed for headache Call office if headache returns and if worsening or if associated with any other symptom.  Cough is related to GERD. use omeprazole daily x 7days then stop.   General Headache Without Cause A headache is pain or discomfort that is felt around the head or neck area. There are many causes and types of headaches. In some cases, the cause may not be found. Follow these instructions at home: Watch your condition for any changes. Let your doctor know about them. Take these steps to help with your condition: Managing pain      Take over-the-counter and prescription medicines only as told by your doctor.  Lie down in a dark, quiet room when you have a headache.  If told, put ice on your head and neck area: ? Put ice in a plastic bag. ? Place a towel between your skin and the bag. ? Leave the ice on for 20 minutes, 2-3 times per day.  If told, put heat on the affected area. Use the heat source that your doctor recommends, such as a moist heat pack or a heating pad. ? Place a towel between your skin and the heat source. ? Leave the heat on for 20-30 minutes. ? Remove the heat if your skin turns bright red. This is very important if you are unable to feel pain, heat, or cold. You may have a greater risk of getting burned.  Keep lights dim if bright lights bother you or make your headaches worse. Eating and drinking  Eat meals on a regular schedule.  If you drink alcohol: ? Limit how much you use to:  0-1 drink a day for women.  0-2 drinks a day for men. ? Be aware of how much alcohol is in your drink. In the [place] one drink equals one 12 oz bottle of beer (355 mL), one 5 oz glass of wine (148 mL), or one 1 oz glass of hard liquor (44 mL).  Stop drinking caffeine, or reduce how much caffeine you drink. General instructions   Keep a journal to find out if certain things bring on headaches. For example, write down: ? What you eat and  drink. ? How much sleep you get. ? Any change to your diet or medicines.  Get a massage or try other ways to relax.  Limit stress.  Sit up straight. Do not tighten (tense) your muscles.  Do not use any products that contain nicotine or tobacco. This includes cigarettes, e-cigarettes, and chewing tobacco. If you need help quitting, ask your doctor.  Exercise regularly as told by your doctor.  Get enough sleep. This often means 7-9 hours of sleep each night.  Keep all follow-up visits as told by your doctor. This is important. Contact a doctor if:  Your symptoms are not helped by medicine.  You have a headache that feels different than the other headaches.  You feel sick to your stomach (nauseous) or you throw up (vomit).  You have a fever. Get help right away if:  Your headache gets very bad quickly.  Your headache gets worse after a lot of physical activity.  You keep throwing up.  You have a stiff neck.  You have trouble seeing.  You have trouble speaking.  You have pain in the eye or ear.  Your muscles are weak or you lose muscle control.  You lose your balance or have trouble walking.  You feel like you will pass out (faint) or  you pass out.  You are mixed up (confused).  You have a seizure. Summary  A headache is pain or discomfort that is felt around the head or neck area.  There are many causes and types of headaches. In some cases, the cause may not be found.  Keep a journal to help find out what causes your headaches. Watch your condition for any changes. Let your doctor know about them.  Contact a doctor if you have a headache that is different from usual, or if your headache is not helped by medicine.  Get help right away if your headache gets very bad, you throw up, you have trouble seeing, you lose your balance, or you have a seizure. This information is not intended to replace advice given to you by your health care provider. Make sure you  discuss any questions you have with your health care provider. Document Revised: 11/28/2017 Document Reviewed: 11/28/2017 Elsevier Patient Education  [place].

## 2019-08-27 ENCOUNTER — Other Ambulatory Visit: Payer: Self-pay

## 2019-08-27 DIAGNOSIS — I25119 Atherosclerotic heart disease of native coronary artery with unspecified angina pectoris: Secondary | ICD-10-CM

## 2019-08-27 DIAGNOSIS — G40909 Epilepsy, unspecified, not intractable, without status epilepticus: Secondary | ICD-10-CM

## 2019-08-27 DIAGNOSIS — K2100 Gastro-esophageal reflux disease with esophagitis, without bleeding: Secondary | ICD-10-CM

## 2019-08-27 DIAGNOSIS — E1142 Type 2 diabetes mellitus with diabetic polyneuropathy: Secondary | ICD-10-CM

## 2019-08-27 NOTE — Progress Notes (Signed)
Referral created for CCM/thx dmf

## 2019-08-28 ENCOUNTER — Telehealth: Payer: Self-pay | Admitting: Nurse Practitioner

## 2019-08-28 ENCOUNTER — Ambulatory Visit: Payer: PPO

## 2019-08-28 DIAGNOSIS — K2100 Gastro-esophageal reflux disease with esophagitis, without bleeding: Secondary | ICD-10-CM

## 2019-08-28 DIAGNOSIS — E1142 Type 2 diabetes mellitus with diabetic polyneuropathy: Secondary | ICD-10-CM

## 2019-08-28 MED ORDER — BLOOD GLUCOSE MONITOR KIT: PACK | 0 refills | Status: DC

## 2019-08-28 NOTE — Telephone Encounter (Signed)
-----  Message from Germaine Pomfret, [facility] sent at 08/28/2019  3:31 PM EDT ----- Baldo Ash, Would you be able to send in a new Rx for a glucose monitoring kit so patient may begin checking his blood sugars? Also, do you remember why his aspirin was discontinued back in 2019? Do you think given his history of CAD and diabetes he would benefit from restarting a daily aspirin?

## 2019-08-28 NOTE — Telephone Encounter (Signed)
Glucometer script will be faxed by [facility]. Aspirin was discontinued in past due to uncontrolled GERD symptoms. He has no hx of GI bleed. If cardiology recommends  Daily use of ASA 81mg , I am ok with adding that to medication list.

## 2019-08-28 NOTE — Chronic Care Management (AMB) (Signed)
Chronic Care Management Pharmacy  Name: Nicholas Rasmussen  MRN: [id number] DOB: 05-Sep-1945  Chief Complaint/ HPI  [name],  74 y.o. , male presents for their Initial CCM visit with the clinical pharmacist via telephone.  PCP : Flossie Buffy, NP  Their chronic conditions include: Type 2 diabetes, CAD, seizure disorder, GERD   Office Visits: 08/22/19: Video Visit with [name] for headache and cough. Patient told to take tylenol for headache, and to start omeprazole for cough for 7 days then stop  07/18/19: Video Visit with [name] for sore throat related to reflux symptoms. Patient started on omeprazole 20 mg daily PRN.  04/11/19: Patient seen by [name] for follow-up. Labwork stable, BP 120/82. No medication changes made at that time.   Consult Visits: None in past 6 months   Medications: Outpatient Encounter Medications as of 08/28/2019  Medication Sig  . acetaminophen (TYLENOL) 500 MG tablet Take 1 tablet (500 mg total) by mouth every 8 (eight) hours as needed.  Marland Kitchen atorvastatin (LIPITOR) 20 MG tablet Take 1 tablet (20 mg total) by mouth daily at 6 PM.  . divalproex (DEPAKOTE ER) 500 MG 24 hr tablet Take 1 tablet (500 mg total) by mouth at bedtime.  . gabapentin (NEURONTIN) 300 MG capsule Take 1 capsule (300 mg total) by mouth at bedtime as needed. Please call [phone] to schedule yearly follow up appt.  Marland Kitchen glipiZIDE (GLUCOTROL XL) 5 MG 24 hr tablet Take 1 tablet (5 mg total) by mouth daily with breakfast.  . metoprolol succinate (TOPROL-XL) 25 MG 24 hr tablet Take 1 tablet (25 mg total) by mouth daily.  Marland Kitchen omeprazole (PRILOSEC) 20 MG capsule Take 1 capsule (20 mg total) by mouth daily as needed.  . vitamin C (ASCORBIC ACID) 500 MG tablet Take 500 mg by mouth daily.  Marland Kitchen triamcinolone cream (KENALOG) 0.1 % Apply 1 application topically 2 (two) times daily. (Patient not taking: Reported on 08/28/2019)   No facility-administered encounter medications on file as  of 08/28/2019.     Current Diagnosis/Assessment:  Goals Addressed            This Visit's Progress   . Chronic Care Management       CARE PLAN ENTRY  Current Barriers:  . Chronic Disease Management support, education, and care coordination needs related to Type 2 diabetes, CAD, seizure disorder, heartburn  Clinical Goal(s): Over the next 14 days, patient will:  . Work with the care management team to address educational, disease management, and care coordination needs  . Begin or continue self health monitoring activities as directed today  . Call provider office for new or worsened signs and symptoms  . Call care management team with questions or concerns . Maintain A1c less than 7%  . Achieve LDL (Bad cholesterol) less than 70  Interventions:  . Evaluation of current treatment plans and patient's adherence to plan as established by provider . Assessed patient understanding of disease states . Assessed patient's education and care coordination needs . Provided disease specific education to patient  . Begin checking your blood sugar 1-2 times weekly before breakfast.   Face to Face appointment with pharmacist scheduled for:  09/11/19 at 2:45 PM to review how to use a glucose monitor       Diabetes   Recent Relevant Labs: Lab Results  Component Value Date/Time   HGBA1C 6.4 04/11/2019 10:31 AM   HGBA1C 6.7 (H) 10/10/2018 09:27 AM   MICROALBUR 3.3 (H) 10/10/2018 09:27 AM  MICROALBUR 1.4 11/23/2017 02:44 PM     Checking BG: Never. Patient does not have glucose monitor  Recent FBG Readings: n/a Recent pre-meal BG readings: n/a Recent 2hr PP BG readings:  n/a Recent HS BG readings: n/a  Patient has failed these meds in past: Metformin XR 500 mg  Patient is currently controlled on the following medications:   Glipizide XL 5 mg daily  Goal <7%   Last diabetic Foot exam:  Lab Results  Component Value Date/Time   HMDIABEYEEXA No Retinopathy 10/17/2018 12:00 AM      Last diabetic Eye exam: No results found for: HMDIABFOOTEX   We discussed: diet and exercise extensively. Likes sweets (candies, cooking, sodas). Cut down significantly but will still occassionally indulge. Patient is unsure if he has ever had a symptom of hypoglycemia.   Plan  Continue current medications  Recommend prescribing patient glucose monitoring kit to monitor blood sugar once daily.   Hyperlipidemia w/ CAD   Managed by Dr. Henry Smith   Lipid Panel     Component Value Date/Time   CHOL 130 04/11/2019 1031   CHOL 123 08/04/2017 0904   TRIG 54.0 04/11/2019 1031   HDL 43.80 04/11/2019 1031   HDL 47 08/04/2017 0904   CHOLHDL 3 04/11/2019 1031   VLDL 10.8 04/11/2019 1031   LDLCALC 75 04/11/2019 1031   LDLCALC 65 08/04/2017 0904   LABVLDL 11 08/04/2017 0904    CT Chest (06/22/17) Coronary artery calcium score 151 with Mild CAD in the distal left main, proximal to mid LAD and mid RCA.   The 10-year ASCVD risk score (Goff DC Jr., et al., 2013) is: 18.7%   Values used to calculate the score:     Age: 73 years     Sex: Male     Is Non-Hispanic African American: Yes     Diabetic: Yes     Tobacco smoker: No     Systolic Blood Pressure: 122 mmHg     Is BP treated: No     HDL Cholesterol: 43.8 mg/dL     Total Cholesterol: 130 mg/dL   Patient has failed these meds in past: aspirin  Patient is currently uncontrolled on the following medications:   Atorvastatin 20 mg daily   Metoprolol XL 25 mg daily  LDL Goal <70   We discussed:  diet and exercise extensively. Patient tolerating atorvastatin well and denies side effects. Patient discontinued from daily aspirin in 2019, he was unsure of why.   Plan  Continue current medications   Seizure Disorder   Managed by Dr. Yijun Yan. Last seen 10/19/18   Patient has failed these meds in past:  Patient is currently controlled on the following medications:   Divalproex ER 500 mg at bedtime   Gabapentin 300 QHS PRN - Shingles  Outbreak  We discussed:  Has not had seizure in 30+ years   Plan  Continue current medications  GERD   Patient has failed these meds in past: n/a Patient is currently controlled on the following medications:   Omeprazole 20 mg daily PRN   We discussed:  Belches frequently. Some reflux when he goes to lie down.   Plan  Continue current medications  Misc/OTC   APAP 500 mg q8hr PRN -headache  Vitamin C 500 mg daily   Plan  Continue current medications   Vaccines   Reviewed and discussed patient's vaccination history.    Immunization History  Administered Date(s) Administered  . Fluad Quad(high Dose 65+) 02/01/2019  .   Influenza Whole 02/21/2014  . Influenza, High Dose Seasonal PF 02/03/2016, 02/03/2017, 02/28/2018  . Influenza,inj,Quad PF,6+ Mos 03/13/2015  . PFIZER SARS-COV-2 Vaccination 07/22/2019, 08/21/2019  . Pneumococcal Conjugate-13 05/06/2015  . Pneumococcal Polysaccharide-23 05/07/2014  . Tdap 05/25/2011  . Zoster Recombinat (Shingrix) 12/03/2016, 02/16/2017    Plan  Medication Management   Pt uses [facility] for all medications. Does not use pillbox.   Follow up: 2 weeks for glucose monitoring education.   Alex Fleury,PharmD [phone]    

## 2019-08-28 NOTE — Patient Instructions (Addendum)
Visit Information It was great to speak with you today [name]. Please let me know if you have any questions about our visit or if you ever have any questions regarding medications!   Goals Addressed            This Visit's Progress   . Chronic Care Management       CARE PLAN ENTRY  Current Barriers:  . Chronic Disease Management support, education, and care coordination needs related to Type 2 diabetes, CAD, seizure disorder, heartburn  Clinical Goal(s): Over the next 14 days, patient will:  . Work with the care management team to address educational, disease management, and care coordination needs  . Begin or continue self health monitoring activities as directed today  . Call provider office for new or worsened signs and symptoms  . Call care management team with questions or concerns . Maintain A1c less than 7%  . Achieve LDL (Bad cholesterol) less than 70  Interventions:  . Evaluation of current treatment plans and patient's adherence to plan as established by provider . Assessed patient understanding of disease states . Assessed patient's education and care coordination needs . Provided disease specific education to patient  . Begin checking your blood sugar 1-2 times weekly before breakfast.   Face to Face appointment with pharmacist scheduled for:  09/11/19 at 2:45 PM to review how to use a glucose monitor       Nicholas Rasmussen was given information about Chronic Care Management services today including:  1. CCM service includes personalized support from designated clinical staff supervised by his physician, including individualized plan of care and coordination with other care providers 2. 24/7 contact phone numbers for assistance for urgent and routine care needs. 3. Standard insurance, coinsurance, copays and deductibles apply for chronic care management only during months in which we provide at least 20 minutes of these services. Most insurances cover these  services at 100%, however patients may be responsible for any copay, coinsurance and/or deductible if applicable. This service may help you avoid the need for more expensive face-to-face services. 4. Only one practitioner may furnish and bill the service in a calendar month. 5. The patient may stop CCM services at any time (effective at the end of the month) by phone call to the office staff.  Patient agreed to services and verbal consent obtained.   The patient verbalized understanding of instructions provided today and agreed to receive a mailed copy of patient instruction and/or educational materials. Face to Face appointment with pharmacist scheduled for: 09/11/19 at 2:45 PM   Doristine Section [id number] DASH Eating Plan DASH stands for "Dietary Approaches to Stop Hypertension." The DASH eating plan is a healthy eating plan that has been shown to reduce high blood pressure (hypertension). It may also reduce your risk for type 2 diabetes, heart disease, and stroke. The DASH eating plan may also help with weight loss. What are tips for following this plan?  General guidelines  Avoid eating more than 2,300 mg (milligrams) of salt (sodium) a day. If you have hypertension, you may need to reduce your sodium intake to 1,500 mg a day.  Limit alcohol intake to no more than 1 drink a day for nonpregnant women and 2 drinks a day for men. One drink equals 12 oz of beer, 5 oz of wine, or 1 oz of hard liquor.  Work with your health care provider to maintain a healthy body weight or to lose weight. Ask what an ideal weight is  for you.  Get at least 30 minutes of exercise that causes your heart to beat faster (aerobic exercise) most days of the week. Activities may include walking, swimming, or biking.  Work with your health care provider or diet and nutrition specialist (dietitian) to adjust your eating plan to your individual calorie needs. Reading food labels   Check food labels for the  amount of sodium per serving. Choose foods with less than 5 percent of the Daily Value of sodium. Generally, foods with less than 300 mg of sodium per serving fit into this eating plan.  To find whole grains, look for the word "whole" as the first word in the ingredient list. Shopping  Buy products labeled as "low-sodium" or "no salt added."  Buy fresh foods. Avoid canned foods and premade or frozen meals. Cooking  Avoid adding salt when cooking. Use salt-free seasonings or herbs instead of table salt or sea salt. Check with your health care provider or pharmacist before using salt substitutes.  Do not fry foods. Cook foods using healthy methods such as baking, boiling, grilling, and broiling instead.  Cook with heart-healthy oils, such as olive, canola, soybean, or sunflower oil. Meal planning  Eat a balanced diet that includes: ? 5 or more servings of fruits and vegetables each day. At each meal, try to fill half of your plate with fruits and vegetables. ? Up to 6-8 servings of whole grains each day. ? Less than 6 oz of lean meat, poultry, or fish each day. A 3-oz serving of meat is about the same size as a deck of cards. One egg equals 1 oz. ? 2 servings of low-fat dairy each day. ? A serving of nuts, seeds, or beans 5 times each week. ? Heart-healthy fats. Healthy fats called Omega-3 fatty acids are found in foods such as flaxseeds and coldwater fish, like sardines, salmon, and mackerel.  Limit how much you eat of the following: ? Canned or prepackaged foods. ? Food that is high in trans fat, such as fried foods. ? Food that is high in saturated fat, such as fatty meat. ? Sweets, desserts, sugary drinks, and other foods with added sugar. ? Full-fat dairy products.  Do not salt foods before eating.  Try to eat at least 2 vegetarian meals each week.  Eat more home-cooked food and less restaurant, buffet, and fast food.  When eating at a restaurant, ask that your food be  prepared with less salt or no salt, if possible. What foods are recommended? The items listed may not be a complete list. Talk with your dietitian about what dietary choices are best for you. Grains Whole-grain or whole-wheat bread. Whole-grain or whole-wheat pasta. Brown rice. [place] Morrow. Bulgur. Whole-grain and low-sodium cereals. Pita bread. Low-fat, low-sodium crackers. Whole-wheat flour tortillas. Vegetables Fresh or frozen vegetables (raw, steamed, roasted, or grilled). Low-sodium or reduced-sodium tomato and vegetable juice. Low-sodium or reduced-sodium tomato sauce and tomato paste. Low-sodium or reduced-sodium canned vegetables. Fruits All fresh, dried, or frozen fruit. Canned fruit in natural juice (without added sugar). Meat and other protein foods Skinless chicken or [place]. Ground chicken or [place]. Pork with fat trimmed off. Fish and seafood. Egg whites. Dried beans, peas, or lentils. Unsalted nuts, nut butters, and seeds. Unsalted canned beans. Lean cuts of beef with fat trimmed off. Low-sodium, lean deli meat. Dairy Low-fat (1%) or fat-free (skim) milk. Fat-free, low-fat, or reduced-fat cheeses. Nonfat, low-sodium ricotta or cottage cheese. Low-fat or nonfat yogurt. Low-fat, low-sodium cheese. Fats and oils  Soft margarine without trans fats. Vegetable oil. Low-fat, reduced-fat, or light mayonnaise and salad dressings (reduced-sodium). Canola, safflower, olive, soybean, and sunflower oils. Avocado. Seasoning and other foods Herbs. Spices. Seasoning mixes without salt. Unsalted popcorn and pretzels. Fat-free sweets. What foods are not recommended? The items listed may not be a complete list. Talk with your dietitian about what dietary choices are best for you. Grains Baked goods made with fat, such as croissants, muffins, or some breads. Dry pasta or rice meal packs. Vegetables Creamed or fried vegetables. Vegetables in a cheese sauce. Regular canned vegetables (not  low-sodium or reduced-sodium). Regular canned tomato sauce and paste (not low-sodium or reduced-sodium). Regular tomato and vegetable juice (not low-sodium or reduced-sodium). Angie Fava. Olives. Fruits Canned fruit in a light or heavy syrup. Fried fruit. Fruit in cream or butter sauce. Meat and other protein foods Fatty cuts of meat. Ribs. Fried meat. Berniece Salines. Sausage. Bologna and other processed lunch meats. Salami. Fatback. Hotdogs. Bratwurst. Salted nuts and seeds. Canned beans with added salt. Canned or smoked fish. Whole eggs or egg yolks. Chicken or [place] with skin. Dairy Whole or 2% milk, cream, and half-and-half. Whole or full-fat cream cheese. Whole-fat or sweetened yogurt. Full-fat cheese. Nondairy creamers. Whipped toppings. Processed cheese and cheese spreads. Fats and oils Butter. Stick margarine. Lard. Shortening. Ghee. Bacon fat. Tropical oils, such as coconut, palm kernel, or palm oil. Seasoning and other foods Salted popcorn and pretzels. Onion salt, garlic salt, seasoned salt, table salt, and sea salt. Worcestershire sauce. Tartar sauce. Barbecue sauce. Teriyaki sauce. Soy sauce, including reduced-sodium. Steak sauce. Canned and packaged gravies. Fish sauce. Oyster sauce. Cocktail sauce. Horseradish that you find on the shelf. Ketchup. Mustard. Meat flavorings and tenderizers. Bouillon cubes. Hot sauce and Tabasco sauce. Premade or packaged marinades. Premade or packaged taco seasonings. Relishes. Regular salad dressings. Where to find more information:  National Heart, Lung, and [place]: https://wilson-eaton.com/  American Heart Association: www.heart.org Summary  The DASH eating plan is a healthy eating plan that has been shown to reduce high blood pressure (hypertension). It may also reduce your risk for type 2 diabetes, heart disease, and stroke.  With the DASH eating plan, you should limit salt (sodium) intake to 2,300 mg a day. If you have hypertension, you may need to reduce  your sodium intake to 1,500 mg a day.  When on the DASH eating plan, aim to eat more fresh fruits and vegetables, whole grains, lean proteins, low-fat dairy, and heart-healthy fats.  Work with your health care provider or diet and nutrition specialist (dietitian) to adjust your eating plan to your individual calorie needs. This information is not intended to replace advice given to you by your health care provider. Make sure you discuss any questions you have with your health care provider. Document Revised: 04/22/2017 Document Reviewed: 05/03/2016 Elsevier Patient Education  2020 [employer].

## 2019-08-28 NOTE — Progress Notes (Signed)
  Chronic Care Management   Note  08/28/2019 Name: Nicholas Rasmussen MRN: [id number] DOB: [birth year]  Nicholas Rasmussen is a 74 y.o. year old male who is a primary care patient of Nicholas Rasmussen, Nicholas Brooke, Nicholas Rasmussen. I reached out to Nicholas Rasmussen by phone today in response to a referral sent by Nicholas Rasmussen's PCP, Nicholas Rasmussen, Nicholas Brooke, Nicholas Rasmussen.   Nicholas Rasmussen was given information about Chronic Care Management services today including:  1. CCM service includes personalized support from designated clinical staff supervised by his physician, including individualized plan of care and coordination with other care providers 2. 24/7 contact phone numbers for assistance for urgent and routine care needs. 3. Service will only be billed when office clinical staff spend 20 minutes or more in a month to coordinate care. 4. Only one practitioner may furnish and bill the service in a calendar month. 5. The patient may stop CCM services at any time (effective at the end of the month) by phone call to the office staff.   Patient agreed to services and verbal consent obtained.   Follow up plan:   Earney Hamburg [employer] Scheduler

## 2019-08-29 NOTE — Telephone Encounter (Signed)
Pt is aware about the glucose meter kit--will call the pt back once we know more about Asprin from Alex--per [facility]

## 2019-09-11 ENCOUNTER — Ambulatory Visit: Payer: PPO

## 2019-09-11 DIAGNOSIS — I25119 Atherosclerotic heart disease of native coronary artery with unspecified angina pectoris: Secondary | ICD-10-CM

## 2019-09-11 DIAGNOSIS — E1142 Type 2 diabetes mellitus with diabetic polyneuropathy: Secondary | ICD-10-CM

## 2019-09-13 ENCOUNTER — Other Ambulatory Visit: Payer: Self-pay

## 2019-09-13 NOTE — Chronic Care Management (AMB) (Signed)
Chronic Care Management Pharmacy  Name: Nicholas Rasmussen  MRN: [id number] DOB: 1946/02/10  Chief Complaint/ HPI  Nicholas Rasmussen,  74 y.o. , male presents for their Follow-Up CCM visit with the clinical pharmacist In office.  PCP : Nicholas Buffy, NP  Their chronic conditions include: Type 2 diabetes, CAD, seizure disorder, GERD   Office Visits: 08/22/19: Video Visit with Nicholas Rasmussen for headache and cough. Patient told to take tylenol for headache, and to start omeprazole for cough for 7 days then stop  07/18/19: Video Visit with Nicholas Rasmussen for sore throat related to reflux symptoms. Patient started on omeprazole 20 mg daily PRN.  04/11/19: Patient seen by Nicholas Rasmussen for follow-up. Labwork stable, BP 120/82. No medication changes made at that time.   Consult Visits: None in past 6 months   Medications: Outpatient Encounter Medications as of 09/14/2019  Medication Sig  . acetaminophen (TYLENOL) 500 MG tablet Take 1 tablet (500 mg total) by mouth every 8 (eight) hours as needed.  Marland Kitchen atorvastatin (LIPITOR) 20 MG tablet Take 1 tablet (20 mg total) by mouth daily at 6 PM.  . blood glucose meter kit and supplies KIT Dispense based on patient and insurance preference. Use 2-3x/week (fasting), ICD E11.42  . divalproex (DEPAKOTE ER) 500 MG 24 hr tablet Take 1 tablet (500 mg total) by mouth at bedtime.  . gabapentin (NEURONTIN) 300 MG capsule Take 1 capsule (300 mg total) by mouth at bedtime as needed. Please call [phone] to schedule yearly follow up appt.  Marland Kitchen glipiZIDE (GLUCOTROL XL) 5 MG 24 hr tablet Take 1 tablet (5 mg total) by mouth daily with breakfast.  . metoprolol succinate (TOPROL-XL) 25 MG 24 hr tablet Take 1 tablet (25 mg total) by mouth daily.  Marland Kitchen omeprazole (PRILOSEC) 20 MG capsule Take 1 capsule (20 mg total) by mouth daily as needed.  . triamcinolone cream (KENALOG) 0.1 % Apply 1 application topically 2 (two) times daily. (Patient not taking: Reported on 08/28/2019)  .  vitamin C (ASCORBIC ACID) 500 MG tablet Take 500 mg by mouth daily.   No facility-administered encounter medications on file as of 09/14/2019.     Current Diagnosis/Assessment:  Goals Addressed            This Visit's Progress   . Chronic Care Management       CARE PLAN ENTRY  Current Barriers:  . Chronic Disease Management support, education, and care coordination needs related to Type 2 diabetes, coronary artery disease, seizure disorder, heartburn  Clinical Goal(s): Over the next 90 days, patient will:  . Work with the care management team to address educational, disease management, and care coordination needs  . Begin or continue self health monitoring activities as directed today  . Call provider office for new or worsened signs and symptoms  . Call care management team with questions or concerns . Maintain A1c less than 7%  . Achieve LDL (Bad cholesterol) less than 70  Interventions:  . Evaluation of current treatment plans and patient's adherence to plan as established by provider . Assessed patient understanding of disease states . Assessed patient's education and care coordination needs . Provided disease specific education to patient   Patient Self Care Activities:  . Patient agrees to check blood sugar sporadically and when having symptoms of hypoglycemia    Telephone follow up appointment with pharmacy team member scheduled for: 03/10/20 at 1:00 PM       Diabetes   Recent Relevant Labs: Lab Results  Component  Value Date/Time   HGBA1C 6.4 04/11/2019 10:31 AM   HGBA1C 6.7 (H) 10/10/2018 09:27 AM   MICROALBUR 3.3 (H) 10/10/2018 09:27 AM   MICROALBUR 1.4 11/23/2017 02:44 PM     Checking BG: Never. Patient does not have glucose monitor  Recent FBG Readings: n/a Recent pre-meal BG readings: n/a Recent 2hr PP BG readings:  n/a Recent HS BG readings: n/a  Patient has failed these meds in past: Metformin XR 500 mg  Patient is currently controlled on the  following medications:   Glipizide XL 5 mg daily  Goal <7%   Last diabetic Foot exam:  Lab Results  Component Value Date/Time   HMDIABEYEEXA No Retinopathy 10/17/2018 12:00 AM    Last diabetic Eye exam: No results found for: HMDIABFOOTEX   We discussed: diet and exercise extensively. Likes sweets (candies, cooking, sodas). Cut down significantly but will still occassionally indulge. Patient denies symptoms of hypoglycemia. Patient recently acquired blood glucose machine, counseled patient on appropriate use. Counseled patient to check blood sugars if he ever has symptoms of low blood sugar. Counseled patient on symptoms of low blood sugar.   Plan  Continue current medications   Hyperlipidemia w/ CAD   Managed by Dr. Daneen Rasmussen (last seen 10/26/18)  Lipid Panel     Component Value Date/Time   CHOL 130 04/11/2019 1031   CHOL 123 08/04/2017 0904   TRIG 54.0 04/11/2019 1031   HDL 43.80 04/11/2019 1031   HDL 47 08/04/2017 0904   CHOLHDL 3 04/11/2019 1031   VLDL 10.8 04/11/2019 1031   LDLCALC 75 04/11/2019 1031   LDLCALC 65 08/04/2017 0904   LABVLDL 11 08/04/2017 0904    CT Chest (06/22/17) Coronary artery calcium score 151 with Mild CAD in the distal left main, proximal to mid LAD and mid RCA.   The 10-year ASCVD risk score Mikey Bussing DC Brooke Bonito., et al., 2013) is: 18.7%   Values used to calculate the score:     Age: 31 years     Sex: Male     Is Non-Hispanic African American: Yes     Diabetic: Yes     Tobacco smoker: No     Systolic Blood Pressure: 591 mmHg     Is BP treated: No     HDL Cholesterol: 43.8 mg/dL     Total Cholesterol: 130 mg/dL   Patient has failed these meds in past: aspirin  Patient is currently uncontrolled on the following medications:   Atorvastatin 20 mg daily   Metoprolol XL 25 mg daily  LDL Goal <70   We discussed:  diet and exercise extensively. Patient tolerating atorvastatin well and denies side effects.  Plan  Continue current medications    Seizure Disorder   Managed by Nicholas Rasmussen. Last seen 10/19/18   Patient has failed these meds in past:  Patient is currently controlled on the following medications:   Divalproex ER 500 mg at bedtime   Gabapentin 300 QHS PRN - Shingles Outbreak  We discussed:  Has not had seizure in 30+ years   Plan  Continue current medications  GERD   Patient has failed these meds in past: n/a Patient is currently controlled on the following medications:   Omeprazole 20 mg daily PRN   We discussed:  Belches frequently. Some reflux when he goes to lie down.   Plan  Continue current medications  Misc/OTC   APAP 500 mg q8hr PRN -headache  Vitamin C 500 mg daily   Plan  Continue current medications  Vaccines   Reviewed and discussed patient's vaccination history.    Immunization History  Administered Date(s) Administered  . Fluad Quad(high Dose 65+) 02/01/2019  . Influenza Whole 02/21/2014  . Influenza, High Dose Seasonal PF 02/03/2016, 02/03/2017, 02/28/2018  . Influenza,inj,Quad PF,6+ Mos 03/13/2015  . PFIZER SARS-COV-2 Vaccination 07/22/2019, 08/21/2019  . Pneumococcal Conjugate-13 05/06/2015  . Pneumococcal Polysaccharide-23 05/07/2014  . Tdap 05/25/2011  . Zoster Recombinat (Shingrix) 12/03/2016, 02/16/2017    Plan  Medication Management   Pt uses [facility] for all medications. Does not use pillbox.   Follow up: 6 months   Doristine Section [phone]

## 2019-09-14 ENCOUNTER — Ambulatory Visit: Payer: PPO

## 2019-09-14 VITALS — Ht 75.5 in | Wt 244.6 lb

## 2019-09-14 DIAGNOSIS — E1142 Type 2 diabetes mellitus with diabetic polyneuropathy: Secondary | ICD-10-CM

## 2019-09-14 DIAGNOSIS — I25119 Atherosclerotic heart disease of native coronary artery with unspecified angina pectoris: Secondary | ICD-10-CM

## 2019-09-14 NOTE — Patient Instructions (Addendum)
Visit Information  Goals Addressed            This Visit's Progress   . Chronic Care Management       CARE PLAN ENTRY  Current Barriers:  . Chronic Disease Management support, education, and care coordination needs related to Type 2 diabetes, coronary artery disease, seizure disorder, heartburn  Clinical Goal(s): Over the next 90 days, patient will:  . Work with the care management team to address educational, disease management, and care coordination needs  . Begin or continue self health monitoring activities as directed today  . Call provider office for new or worsened signs and symptoms  . Call care management team with questions or concerns . Maintain A1c less than 7%  . Achieve LDL (Bad cholesterol) less than 70  Interventions:  . Evaluation of current treatment plans and patient's adherence to plan as established by provider . Assessed patient understanding of disease states . Assessed patient's education and care coordination needs . Provided disease specific education to patient   Patient Self Care Activities:  . Patient agrees to check blood sugar sporadically and when having symptoms of hypoglycemia    Telephone follow up appointment with pharmacy team member scheduled for: 03/10/20 at 1:00 PM       The patient verbalized understanding of instructions provided today and agreed to receive a mailed copy of patient instruction and/or educational materials.  Telephone follow up appointment with pharmacy team member scheduled for: 10/18  Doristine Section [phone] Hypoglycemia Hypoglycemia is when the sugar (glucose) level in your blood is too low. Signs of low blood sugar may include:  Feeling: ? Hungry. ? Worried or nervous (anxious). ? Sweaty and clammy. ? Confused. ? Dizzy. ? Sleepy. ? Sick to your stomach (nauseous).  Having: ? A fast heartbeat. ? A headache. ? A change in your vision. ? Tingling or no feeling (numbness) around your mouth,  lips, or tongue. ? Jerky movements that you cannot control (seizure).  Having trouble with: ? Moving (coordination). ? Sleeping. ? Passing out (fainting). ? Getting upset easily (irritability). Low blood sugar can happen to people who have diabetes and people who do not have diabetes. Low blood sugar can happen quickly, and it can be an emergency. Treating low blood sugar Low blood sugar is often treated by eating or drinking something sugary right away, such as:  Fruit juice, 4-6 oz (120-150 mL).  Regular soda (not diet soda), 4-6 oz (120-150 mL).  Low-fat milk, 4 oz (120 mL).  Several pieces of hard candy.  Sugar or honey, 1 Tbsp (15 mL). Treating low blood sugar if you have diabetes If you can think clearly and swallow safely, follow the 15:15 rule:  Take 15 grams of a fast-acting carb (carbohydrate). Talk with your doctor about how much you should take.  Always keep a source of fast-acting carb with you, such as: ? Sugar tablets (glucose pills). Take 3-4 pills. ? 6-8 pieces of hard candy. ? 4-6 oz (120-150 mL) of fruit juice. ? 4-6 oz (120-150 mL) of regular (not diet) soda. ? 1 Tbsp (15 mL) honey or sugar.  Check your blood sugar 15 minutes after you take the carb.  If your blood sugar is still at or below 70 mg/dL (3.9 mmol/L), take 15 grams of a carb again.  If your blood sugar does not go above 70 mg/dL (3.9 mmol/L) after 3 tries, get help right away.  After your blood sugar goes back to normal, eat a meal or  a snack within 1 hour.  Treating very low blood sugar If your blood sugar is at or below 54 mg/dL (3 mmol/L), you have very low blood sugar (severe hypoglycemia). This may also cause:  Passing out.  Jerky movements you cannot control (seizure).  Losing consciousness (coma). This is an emergency. Do not wait to see if the symptoms will go away. Get medical help right away. Call your local emergency services (911 in the [place] Do not drive yourself to the  hospital. If you have very low blood sugar and you cannot eat or drink, you may need a glucagon shot (injection). A family member or friend should learn how to check your blood sugar and how to give you a glucagon shot. Ask your doctor if you need to have a glucagon shot kit at home. Follow these instructions at home: General instructions  Take over-the-counter and prescription medicines only as told by your doctor.  Stay aware of your blood sugar as told by your doctor.  Limit alcohol intake to no more than 1 drink a day for nonpregnant women and 2 drinks a day for men. One drink equals 12 oz of beer (355 mL), 5 oz of wine (148 mL), or 1 oz of hard liquor (44 mL).  Keep all follow-up visits as told by your doctor. This is important. If you have diabetes:   Follow your diabetes care plan as told by your doctor. Make sure you: ? Know the signs of low blood sugar. ? Take your medicines as told. ? Follow your exercise and meal plan. ? Eat on time. Do not skip meals. ? Check your blood sugar as often as told by your doctor. Always check it before and after exercise. ? Follow your sick day plan when you cannot eat or drink normally. Make this plan ahead of time with your doctor.  Share your diabetes care plan with: ? Your work or school. ? People you live with.  Check your pee (urine) for ketones: ? When you are sick. ? As told by your doctor.  Carry a card or wear jewelry that says you have diabetes. Contact a doctor if:  You have trouble keeping your blood sugar in your target range.  You have low blood sugar often. Get help right away if:  You still have symptoms after you eat or drink something sugary.  Your blood sugar is at or below 54 mg/dL (3 mmol/L).  You have jerky movements that you cannot control.  You pass out. These symptoms may be an emergency. Do not wait to see if the symptoms will go away. Get medical help right away. Call your local emergency services (911  in the [place] Do not drive yourself to the hospital. Summary  Hypoglycemia happens when the level of sugar (glucose) in your blood is too low.  Low blood sugar can happen to people who have diabetes and people who do not have diabetes. Low blood sugar can happen quickly, and it can be an emergency.  Make sure you know the signs of low blood sugar and know how to treat it.  Always keep a source of sugar (fast-acting carb) with you to treat low blood sugar. This information is not intended to replace advice given to you by your health care provider. Make sure you discuss any questions you have with your health care provider. Document Revised: 08/31/2018 Document Reviewed: 06/13/2015 Elsevier Patient Education  2020 [employer].

## 2019-10-08 ENCOUNTER — Ambulatory Visit (INDEPENDENT_AMBULATORY_CARE_PROVIDER_SITE_OTHER): Payer: PPO | Admitting: Nurse Practitioner

## 2019-10-08 ENCOUNTER — Other Ambulatory Visit: Payer: Self-pay

## 2019-10-08 ENCOUNTER — Telehealth: Payer: Self-pay | Admitting: Interventional Cardiology

## 2019-10-08 ENCOUNTER — Encounter: Payer: Self-pay | Admitting: Nurse Practitioner

## 2019-10-08 VITALS — BP 126/60 | HR 43 | Temp 96.7°F | Ht 75.5 in | Wt 239.2 lb

## 2019-10-08 DIAGNOSIS — I25119 Atherosclerotic heart disease of native coronary artery with unspecified angina pectoris: Secondary | ICD-10-CM

## 2019-10-08 DIAGNOSIS — E1142 Type 2 diabetes mellitus with diabetic polyneuropathy: Secondary | ICD-10-CM | POA: Diagnosis not present

## 2019-10-08 DIAGNOSIS — R001 Bradycardia, unspecified: Secondary | ICD-10-CM

## 2019-10-08 LAB — BASIC METABOLIC PANEL
BUN: 16 mg/dL (ref 6–23)
CO2: 31 mEq/L (ref 19–32)
Calcium: 8.9 mg/dL (ref 8.4–10.5)
Chloride: 101 mEq/L (ref 96–112)
Creatinine, Ser: 1.08 mg/dL (ref 0.40–1.50)
GFR: 81.43 mL/min (ref >60.00)
Glucose, Bld: 101 mg/dL — ABNORMAL HIGH (ref 70–99)
Potassium: 4.2 mEq/L (ref 3.5–5.1)
Sodium: 139 mEq/L (ref 135–145)

## 2019-10-08 LAB — HEMOGLOBIN A1C: Hgb A1c MFr Bld: 6.2 % (ref 4.6–6.5)

## 2019-10-08 LAB — MICROALBUMIN / CREATININE URINE RATIO
Creatinine,U: 327.5 mg/dL
Microalb Creat Ratio: 0.5 mg/g (ref 0.0–30.0)
Microalb, Ur: 1.5 mg/dL (ref 0.0–1.9)

## 2019-10-08 LAB — CK: Total CK: 168 U/L (ref 7–232)

## 2019-10-08 LAB — TSH: TSH: 1.25 u[IU]/mL (ref 0.35–4.50)

## 2019-10-08 NOTE — Patient Instructions (Addendum)
Stable lab results. F/up in 18months  ECG indicates bradycardia Hold metoprolol for 1day, then Decrease metoprolol to 12.5mg . also inform Dr. Tamala Julian  (cardiology)about change.  resume omeprazole 20mg  daily.

## 2019-10-08 NOTE — Telephone Encounter (Signed)
Patient returning call.

## 2019-10-08 NOTE — Telephone Encounter (Signed)
LMTCB

## 2019-10-08 NOTE — Progress Notes (Signed)
Subjective:  Patient ID: Nicholas Rasmussen, male    DOB: 12/12/1945  Age: 74 y.o. MRN: [id number]  CC: Follow-up (DM, HTN, and hyperlipidemia-pt is fasting//pt doesn't check blood pressure or blood sugar at home//pulse rate 43 checked twice)  HPI Nicholas Rasmussen presents for chronic condition f/up. He denies any acute complaint. Low HR noted today. He denies any fatigue or palpitations or chest pain or dizziness. He denies any hypoglycemic symptoms.  BP Readings from Last 3 Encounters:  10/08/19 126/60  08/22/19 122/69  07/18/19 125/66   Wt Readings from Last 3 Encounters:  10/08/19 239 lb 3.2 oz (108.5 kg)  09/14/19 244 lb 9.6 oz (110.9 kg)  08/22/19 240 lb (108.9 kg)   Reviewed past Medical, Social and Family history today.  Outpatient Medications Prior to Visit  Medication Sig Dispense Refill  . acetaminophen (TYLENOL) 500 MG tablet Take 1 tablet (500 mg total) by mouth every 8 (eight) hours as needed. 30 tablet 0  . atorvastatin (LIPITOR) 20 MG tablet Take 1 tablet (20 mg total) by mouth daily at 6 PM. 90 tablet 3  . blood glucose meter kit and supplies KIT Dispense based on patient and insurance preference. Use 2-3x/week (fasting), ICD E11.42 1 each 0  . divalproex (DEPAKOTE ER) 500 MG 24 hr tablet Take 1 tablet (500 mg total) by mouth at bedtime. 90 tablet 4  . gabapentin (NEURONTIN) 300 MG capsule Take 1 capsule (300 mg total) by mouth at bedtime as needed. Please call [phone] to schedule yearly follow up appt. 90 capsule 0  . Lancets (ONETOUCH DELICA PLUS [id number]) MISC USE AS DIRECTED. TAKE BLOOD SUGAR 2  3 TIMES PER WEEK BEFORE MEALS.    . metoprolol succinate (TOPROL-XL) 25 MG 24 hr tablet Take 0.5 tablets (12.5 mg total) by mouth daily. 90 tablet 3  . ONETOUCH ULTRA test strip USE AS DIRECTED. TEST BLOOD SUGAR 2   3 TIMES PER WEEK. BEFORE EATING    . triamcinolone cream (KENALOG) 0.1 % Apply 1 application topically 2 (two) times daily. 15 g 1  . vitamin C (ASCORBIC ACID)  500 MG tablet Take 500 mg by mouth daily.    Marland Kitchen glipiZIDE (GLUCOTROL XL) 5 MG 24 hr tablet Take 1 tablet (5 mg total) by mouth daily with breakfast. 90 tablet 1  . metoprolol succinate (TOPROL-XL) 25 MG 24 hr tablet Take 1 tablet (25 mg total) by mouth daily. 90 tablet 3  . omeprazole (PRILOSEC) 20 MG capsule Take 1 capsule (20 mg total) by mouth daily as needed. (Patient not taking: Reported on 10/08/2019) 30 capsule 3   No facility-administered medications prior to visit.    ROS See HPI  Objective:  BP 126/60   Pulse (!) 43   Temp (!) 96.7 F (35.9 C) (Tympanic)   Ht 6' 3.5" (1.918 m)   Wt 239 lb 3.2 oz (108.5 kg)   SpO2 98%   BMI 29.50 kg/m   BP Readings from Last 3 Encounters:  10/08/19 126/60  08/22/19 122/69  07/18/19 125/66   Wt Readings from Last 3 Encounters:  10/08/19 239 lb 3.2 oz (108.5 kg)  09/14/19 244 lb 9.6 oz (110.9 kg)  08/22/19 240 lb (108.9 kg)   ECG: S. Bradycardia with PR 0.214, QT 0.458, QTc 0.392 Sent copy to cardiology for over read.  Physical Exam Vitals reviewed.  Constitutional:      General: He is not in acute distress.    Appearance: He is not diaphoretic.  Cardiovascular:  Rate and Rhythm: Normal rate and regular rhythm.     Pulses: Normal pulses.     Heart sounds: Normal heart sounds.  Pulmonary:     Effort: Pulmonary effort is normal.     Breath sounds: Normal breath sounds.  Musculoskeletal:     Right lower leg: No edema.     Left lower leg: No edema.  Neurological:     Mental Status: He is alert and oriented to person, place, and time.  Psychiatric:        Mood and Affect: Mood normal.        Behavior: Behavior normal.        Thought Content: Thought content normal.    Lab Results  Component Value Date   WBC 6.9 10/15/2017   HGB 12.4 (L) 10/15/2017   HCT 38.8 (L) 10/15/2017   PLT 202 10/15/2017   GLUCOSE 101 (H) 10/08/2019   CHOL 130 04/11/2019   TRIG 54.0 04/11/2019   HDL 43.80 04/11/2019   LDLCALC 75 04/11/2019     ALT 6 04/11/2019   AST 16 04/11/2019   NA 139 10/08/2019   K 4.2 10/08/2019   CL 101 10/08/2019   CREATININE 1.08 10/08/2019   BUN 16 10/08/2019   CO2 31 10/08/2019   TSH 1.25 10/08/2019   PSA 0.42 11/17/2016   HGBA1C 6.2 10/08/2019   MICROALBUR 1.5 10/08/2019    Assessment & Plan:  This visit occurred during the SARS-CoV-2 public health emergency.  Safety protocols were in place, including screening questions prior to the visit, additional usage of staff PPE, and extensive cleaning of exam room while observing appropriate contact time as indicated for disinfecting solutions.   Nicholas Rasmussen was seen today for follow-up.  Diagnoses and all orders for this visit:  Bradycardia -     EKG 12-Lead -     TSH  Type 2 diabetes mellitus with diabetic polyneuropathy, without long-term current use of insulin (HCC) -     Hemoglobin A1c -     Basic metabolic panel -     Microalbumin / creatinine urine ratio -     glipiZIDE (GLUCOTROL XL) 5 MG 24 hr tablet; Take 1 tablet (5 mg total) by mouth daily with breakfast.  Coronary artery disease involving native coronary artery of native heart with angina pectoris (HCC) -     CK (Creatine Kinase)  Hold metoprolol for 1day, then Decrease metoprolol to 12.'5mg'$ . Sent message to Dr. Smith(cardiology) about change in dose.  I have changed Nicholas Rasmussen's metoprolol succinate. I am also having him maintain his acetaminophen, vitamin C, divalproex, gabapentin, atorvastatin, triamcinolone cream, blood glucose meter kit and supplies, OneTouch Ultra, OneTouch Delica Plus [id number], and glipiZIDE.  Meds ordered this encounter  Medications  . glipiZIDE (GLUCOTROL XL) 5 MG 24 hr tablet    Sig: Take 1 tablet (5 mg total) by mouth daily with breakfast.    Dispense:  90 tablet    Refill:  3    Discontinue metformin    Order Specific Question:   Supervising Provider    Answer:   Ronnald Nian [[id number]]    Problem List Items Addressed This Visit       Cardiovascular and Mediastinum   CAD (coronary artery disease)   Relevant Medications   metoprolol succinate (TOPROL-XL) 25 MG 24 hr tablet   Other Relevant Orders   CK (Creatine Kinase) (Completed)     Endocrine   Type 2 diabetes mellitus with diabetic polyneuropathy, without long-term current use of insulin (  HCC)   Relevant Medications   glipiZIDE (GLUCOTROL XL) 5 MG 24 hr tablet   Other Relevant Orders   Hemoglobin A1c (Completed)   Basic metabolic panel (Completed)   Microalbumin / creatinine urine ratio (Completed)    Other Visit Diagnoses    Bradycardia    -  Primary   Relevant Orders   EKG 12-Lead (Completed)   TSH (Completed)      Follow-up: Return in about 6 months (around 04/09/2020) for DM and HTN, hyperlipidemia (fasting).  [name], NP

## 2019-10-08 NOTE — Telephone Encounter (Signed)
Spoke with patient who called to inform us that his PCP reduced his metoprolol to 12.5 mg daily because his HR was 43 bpm today. He states she told him to notify us of the change. Patient is due for follow-up with Dr. Tamala Julian in June and I scheduled him for an appointment on 6/1. I asked him to record BP and HR readings daily, approximately 2 hours after taking medications and bring with him to the visit. I advised him to call back prior to appointment with questions or concerns and he verbalized understanding and agreement. He thanked me for the call.

## 2019-10-08 NOTE — Telephone Encounter (Signed)
    Pt c/o medication issue:  1. Name of Medication:   metoprolol succinate (TOPROL-XL) 25 MG 24 hr tablet    2. How are you currently taking this medication (dosage and times per day)? Take 0.5 tablets (12.5 mg total) by mouth daily.  3. Are you having a reaction (difficulty breathing--STAT)?   4. What is your medication issue? Pt said, his pcp changed this meds to take half a tablet daily instead of 1 tablet. He was advised to let Dr. Tamala Julian know and if has different recommendations  Please advise

## 2019-10-09 ENCOUNTER — Other Ambulatory Visit: Payer: Self-pay | Admitting: Nurse Practitioner

## 2019-10-09 ENCOUNTER — Ambulatory Visit: Payer: PPO | Admitting: Nurse Practitioner

## 2019-10-09 DIAGNOSIS — K2100 Gastro-esophageal reflux disease with esophagitis, without bleeding: Secondary | ICD-10-CM

## 2019-10-09 NOTE — Telephone Encounter (Signed)
Thanks

## 2019-10-12 MED ORDER — GLIPIZIDE ER 5 MG PO TB24: 5.0000 mg | ORAL_TABLET | Freq: Every day | ORAL | 3 refills | Status: DC

## 2019-10-12 NOTE — Assessment & Plan Note (Signed)
Controlled with glipizide HgbA1c of 6.2 Resolved urine microalbumin. Stable neuropathy BP at goal Negative DM retinopathy. LDL at goal F/up in 81months

## 2019-10-17 ENCOUNTER — Encounter: Payer: Self-pay | Admitting: Nurse Practitioner

## 2019-10-17 DIAGNOSIS — H35033 Hypertensive retinopathy, bilateral: Secondary | ICD-10-CM | POA: Diagnosis not present

## 2019-10-17 DIAGNOSIS — H26491 Other secondary cataract, right eye: Secondary | ICD-10-CM | POA: Diagnosis not present

## 2019-10-17 DIAGNOSIS — Z961 Presence of intraocular lens: Secondary | ICD-10-CM | POA: Diagnosis not present

## 2019-10-17 DIAGNOSIS — R7309 Other abnormal glucose: Secondary | ICD-10-CM | POA: Diagnosis not present

## 2019-10-17 LAB — HM DIABETES EYE EXAM

## 2019-10-23 ENCOUNTER — Encounter: Payer: Self-pay | Admitting: Interventional Cardiology

## 2019-10-23 ENCOUNTER — Other Ambulatory Visit: Payer: Self-pay

## 2019-10-23 ENCOUNTER — Ambulatory Visit: Payer: PPO | Admitting: Interventional Cardiology

## 2019-10-23 VITALS — BP 128/72 | HR 50 | Ht 75.5 in | Wt 244.8 lb

## 2019-10-23 DIAGNOSIS — I25119 Atherosclerotic heart disease of native coronary artery with unspecified angina pectoris: Secondary | ICD-10-CM | POA: Diagnosis not present

## 2019-10-23 DIAGNOSIS — E785 Hyperlipidemia, unspecified: Secondary | ICD-10-CM

## 2019-10-23 DIAGNOSIS — I712 Thoracic aortic aneurysm, without rupture: Secondary | ICD-10-CM

## 2019-10-23 DIAGNOSIS — E119 Type 2 diabetes mellitus without complications: Secondary | ICD-10-CM | POA: Diagnosis not present

## 2019-10-23 DIAGNOSIS — Z7189 Other specified counseling: Secondary | ICD-10-CM

## 2019-10-23 DIAGNOSIS — I7121 Aneurysm of the ascending aorta, without rupture: Secondary | ICD-10-CM

## 2019-10-23 NOTE — Patient Instructions (Signed)
Medication Instructions:  Your physician recommends that you continue on your current medications as directed. Please refer to the Current Medication list given to you today.  *If you need a refill on your cardiac medications before your next appointment, please call your pharmacy*   Lab Work: None If you have labs (blood work) drawn today and your tests are completely normal, you will receive your results only by: Marland Kitchen MyChart Message (if you have MyChart) OR . A paper copy in the mail If you have any lab test that is abnormal or we need to change your treatment, we will call you to review the results.   Testing/Procedures: Your physician recommends that you have a Chest CT performed sometime this summer.   Follow-Up: At [facility], you and your health needs are our priority.  As part of our continuing mission to provide you with exceptional heart care, we have created designated Provider Care Teams.  These Care Teams include your primary Cardiologist (physician) and Advanced Practice Providers (APPs -  Physician Assistants and Nurse Practitioners) who all work together to provide you with the care you need, when you need it.  We recommend signing up for the patient portal called "MyChart".  Sign up information is provided on this After Visit Summary.  MyChart is used to connect with patients for Virtual Visits (Telemedicine).  Patients are able to view lab/test results, encounter notes, upcoming appointments, etc.  Non-urgent messages can be sent to your provider as well.   To learn more about what you can do with MyChart, go to [email or web address].    Your next appointment:   12 month(s)  The format for your next appointment:   In Person  Provider:   You may see Sinclair Grooms, MD or one of the following Advanced Practice Providers on your designated Care Team:    Truitt Merle, NP  Cecilie Kicks, NP  Kathyrn Drown, NP    Other Instructions

## 2019-10-23 NOTE — Progress Notes (Signed)
Cardiology Office Note:    Date:  10/23/2019   ID:  Nicholas Rasmussen, DOB 1945/08/14, MRN [id number]  PCP:  Flossie Buffy, NP  Cardiologist:  Sinclair Grooms, MD   Referring MD: Flossie Buffy, NP   Chief Complaint  Patient presents with  . Coronary Artery Disease    History of Present Illness:    Nicholas Rasmussen is a 74 y.o. male with a hx of vague chest discomfort and moderate risk coronary artery calcification score 156. Coincidentally found ascending aortic aneurysm, 44 mm.  He has no symptoms of angina.  He has no chest discomfort.  Unable to tolerate 25 mg of Toprol-XL because of excessive bradycardia but asymptomatic.  Now on 12.5 mg.  Last aortic assessment occurred at the time of coronary CTA when aorta was 4.4 cm.  He denies chest discomfort, orthopnea, PND, angina, and claudication.  No episodes of syncope.  No back discomfort.  Past Medical History:  Diagnosis Date  . Allergic rhinitis 05/07/2014  . Dizziness 01/02/2016  . GERD (gastroesophageal reflux disease)   . Headache 03/13/2015  . Left flank pain 01/02/2016  . Low back pain 06/06/2015  . Pain of left side of body 08/13/2015  . Post herpetic neuralgia   . Routine general medical examination at a health care facility 05/07/2014  . Seizure disorder ([place]) 05/07/2014  . Seizures ([place])    Last seizure in the 70s.  . Shingles   . Stye 05/07/2014    Past Surgical History:  Procedure Laterality Date  . COLONOSCOPY    . KNEE SURGERY Left    left miniscus  . UPPER GASTROINTESTINAL ENDOSCOPY    . uvala surgery      Current Medications: Current Meds  Medication Sig  . acetaminophen (TYLENOL) 500 MG tablet Take 1 tablet (500 mg total) by mouth every 8 (eight) hours as needed.  Marland Kitchen atorvastatin (LIPITOR) 20 MG tablet Take 1 tablet (20 mg total) by mouth daily at 6 PM.  . blood glucose meter kit and supplies KIT Dispense based on patient and insurance preference. Use 2-3x/week (fasting), ICD E11.42  .  divalproex (DEPAKOTE ER) 500 MG 24 hr tablet Take 1 tablet (500 mg total) by mouth at bedtime.  . gabapentin (NEURONTIN) 300 MG capsule Take 1 capsule (300 mg total) by mouth at bedtime as needed. Please call [phone] to schedule yearly follow up appt.  Marland Kitchen glipiZIDE (GLUCOTROL XL) 5 MG 24 hr tablet Take 1 tablet (5 mg total) by mouth daily with breakfast.  . Lancets (ONETOUCH DELICA PLUS [id number]) MISC USE AS DIRECTED. TAKE BLOOD SUGAR 2  3 TIMES PER WEEK BEFORE MEALS.  . metoprolol succinate (TOPROL-XL) 25 MG 24 hr tablet Take 0.5 tablets (12.5 mg total) by mouth daily.  Marland Kitchen omeprazole (PRILOSEC) 20 MG capsule TAKE 1 CAPSULE (20 MG TOTAL) BY MOUTH DAILY AS NEEDED.  Marland Kitchen ONETOUCH ULTRA test strip USE AS DIRECTED. TEST BLOOD SUGAR 2   3 TIMES PER WEEK. BEFORE EATING  . vitamin C (ASCORBIC ACID) 500 MG tablet Take 500 mg by mouth daily.     Allergies:   Patient has no known allergies.   Social History   Socioeconomic History  . Marital status: Single    Spouse name: Not on file  . Number of children: 3  . Years of education: Associates  . Highest education level: Not on file  Occupational History  . Occupation: Retired    Comment: Oceanographer  Tobacco Use  . Smoking status: Former  Smoker  . Smokeless tobacco: Never Used  Substance and Sexual Activity  . Alcohol use: No    Alcohol/week: 0.0 standard drinks  . Drug use: No  . Sexual activity: Not on file  Other Topics Concern  . 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   Right-handed.   Rarely drinks caffeine.   Social Determinants of Health   Financial Resource Strain:   . Difficulty of Paying Living Expenses:   Food Insecurity:   . Worried About Charity fundraiser in the Last Year:   . Arboriculturist in the Last Year:   Transportation Needs:   . Film/video editor (Medical):   Marland Kitchen Lack  of Transportation (Non-Medical):   Physical Activity:   . Days of Exercise per Week:   . Minutes of Exercise per Session:   Stress:   . Feeling of Stress :   Social Connections:   . Frequency of Communication with Friends and Family:   . Frequency of Social Gatherings with Friends and Family:   . Attends Religious Services:   . Active Member of Clubs or Organizations:   . Attends Archivist Meetings:   Marland Kitchen Marital Status:      Family History: The patient's family history includes Diabetes in his father; Hypertension in his father; Other in his mother.  ROS:   Please see the history of present illness.    Has been vaccinated.  Doing well.  Did not get Covid.  All other systems reviewed and are negative.  EKGs/Labs/Other Studies Reviewed:    The following studies were reviewed today: COR CT with Morph 2019: IMPRESSION: 1. Coronary calcium score of 151. This was 65 percentile for age and sex matched control.  2. Normal coronary origin with right dominance.  3. Mild CAD in the distal left main, proximal to mid LAD and mid RCA. Aggressive risk factor modification is recommended.  4. Dilated aortic root at the sinus level with maximum diameter 44 Mm.  ECHOCARDIOGRAM 2020: IMPRESSIONS    1. The left ventricle has hyperdynamic systolic function, with an  ejection fraction of >65%. The cavity size was normal. There is moderately  increased left ventricular wall thickness. Left ventricular diastolic  Doppler parameters are consistent with  impaired relaxation. Indeterminate filling pressures.  2. The right ventricle has normal systolic function. The cavity was  normal. There is no increase in right ventricular wall thickness.  3. No evidence of mitral valve stenosis.  EKG:  EKG Completed on 10/08/2019 demonstrates sinus bradycardia with first-degree AV block.  Prominent voltage.  Recent Labs: 04/11/2019: ALT 6 10/08/2019: BUN 16; Creatinine, Ser 1.08;  Potassium 4.2; Sodium 139; TSH 1.25  Recent Lipid Panel    Component Value Date/Time   CHOL 130 04/11/2019 1031   CHOL 123 08/04/2017 0904   TRIG 54.0 04/11/2019 1031   HDL 43.80 04/11/2019 1031   HDL 47 08/04/2017 0904   CHOLHDL 3 04/11/2019 1031   VLDL 10.8 04/11/2019 1031   LDLCALC 75 04/11/2019 1031   LDLCALC 65 08/04/2017 0904    Physical Exam:    VS:  BP 128/72   Pulse (!) 50   Ht 6' 3.5" (1.918 m)   Wt 244 lb 12.8 oz (111 kg)   SpO2 98%   BMI 30.19 kg/m     Wt Readings  from Last 3 Encounters:  10/23/19 244 lb 12.8 oz (111 kg)  10/08/19 239 lb 3.2 oz (108.5 kg)  09/14/19 244 lb 9.6 oz (110.9 kg)     GEN: Healthy-appearing. No acute distress HEENT: Normal NECK: No JVD. LYMPHATICS: No lymphadenopathy CARDIAC: Bradycardic with RRR without murmur, gallop, or edema. VASCULAR:  Normal Pulses. No bruits. RESPIRATORY:  Clear to auscultation without rales, wheezing or rhonchi  ABDOMEN: Soft, non-tender, non-distended, No pulsatile mass, MUSCULOSKELETAL: No deformity  SKIN: Warm and dry NEUROLOGIC:  Alert and oriented x 3 PSYCHIATRIC:  Normal affect   ASSESSMENT:    1. Coronary artery disease involving native coronary artery of native heart with angina pectoris ([place])   2. Ascending aortic aneurysm ([place])   3. Hyperlipidemia with target LDL less than 70   4. Type 2 diabetes mellitus without complication, without long-term current use of insulin ([place])   5. Educated about COVID-19 virus infection    PLAN:    In order of problems listed above:  1. Primary prevention related to risk factors that include diabetes and hyperlipidemia discussed. 2. 2019 ascending aortic diameter 4.4 cm.  A CT of the aorta will be performed for aortic reevaluation and to exclude growth.  Unable to tolerate beta-blocker therapy due to intrinsic bradycardia.  Currently on 12.5 mg of Toprol-XL.  If there is aneurysm growth, we may discontinue beta-blocker therapy and start low-dose ARB  therapy 3. LDL target 70.  Most recent in November 2020 with 75. 4. Hemoglobin A1c target less than 7.  Most recent was 6.2. 5. He has been vaccinated and is still practicing social distancing.  Overall education and awareness concerning primary/secondary risk prevention was discussed in detail: LDL less than 70, hemoglobin A1c less than 7, blood pressure target less than 130/80 mmHg, >150 minutes of moderate aerobic activity per week, avoidance of smoking, weight control (via diet and exercise), and continued surveillance/management of/for obstructive sleep apnea.    Medication Adjustments/Labs and Tests Ordered: Current medicines are reviewed at length with the patient today.  Concerns regarding medicines are outlined above.  Orders Placed This Encounter  Procedures  . CT ANGIO CHEST AORTA W/CM & OR WO/CM   No orders of the defined types were placed in this encounter.   Patient Instructions  Medication Instructions:  Your physician recommends that you continue on your current medications as directed. Please refer to the Current Medication list given to you today.  *If you need a refill on your cardiac medications before your next appointment, please call your pharmacy*   Lab Work: None If you have labs (blood work) drawn today and your tests are completely normal, you will receive your results only by: Marland Kitchen MyChart Message (if you have MyChart) OR . A paper copy in the mail If you have any lab test that is abnormal or we need to change your treatment, we will call you to review the results.   Testing/Procedures: Your physician recommends that you have a Chest CT performed sometime this summer.   Follow-Up: At [facility], you and your health needs are our priority.  As part of our continuing mission to provide you with exceptional heart care, we have created designated Provider Care Teams.  These Care Teams include your primary Cardiologist (physician) and Advanced Practice  Providers (APPs -  Physician Assistants and Nurse Practitioners) who all work together to provide you with the care you need, when you need it.  We recommend signing up for the patient portal called "MyChart".  Sign up information is provided on this After Visit Summary.  MyChart is used to connect with patients for Virtual Visits (Telemedicine).  Patients are able to view lab/test results, encounter notes, upcoming appointments, etc.  Non-urgent messages can be sent to your provider as well.   To learn more about what you can do with MyChart, go to [email or web address].    Your next appointment:   12 month(s)  The format for your next appointment:   In Person  Provider:   You may see Sinclair Grooms, MD or one of the following Advanced Practice Providers on your designated Care Team:    Truitt Merle, NP  Cecilie Kicks, NP  Kathyrn Drown, NP    Other Instructions      Signed, Sinclair Grooms, MD  10/23/2019 8:30 AM    [place]

## 2019-10-31 ENCOUNTER — Ambulatory Visit: Payer: Self-pay

## 2019-10-31 ENCOUNTER — Encounter: Payer: Self-pay | Admitting: Nurse Practitioner

## 2019-10-31 ENCOUNTER — Ambulatory Visit (INDEPENDENT_AMBULATORY_CARE_PROVIDER_SITE_OTHER): Payer: PPO | Admitting: Nurse Practitioner

## 2019-10-31 ENCOUNTER — Other Ambulatory Visit: Payer: Self-pay

## 2019-10-31 VITALS — BP 120/62 | HR 55 | Temp 97.3°F | Ht 75.5 in | Wt 243.2 lb

## 2019-10-31 DIAGNOSIS — R519 Headache, unspecified: Secondary | ICD-10-CM

## 2019-10-31 DIAGNOSIS — E1142 Type 2 diabetes mellitus with diabetic polyneuropathy: Secondary | ICD-10-CM

## 2019-10-31 DIAGNOSIS — K2100 Gastro-esophageal reflux disease with esophagitis, without bleeding: Secondary | ICD-10-CM

## 2019-10-31 NOTE — Progress Notes (Signed)
Subjective:  Patient ID: Nicholas Rasmussen, male    DOB: 10-24-1945  Age: 74 y.o. MRN: [id number]  CC: Pain (above right ear behind ear and sometimes over right eye//)  Otalgia  There is pain in the right ear. This is a new problem. The current episode started in the past 7 days. The problem has been waxing and waning. There has been no fever. Pertinent negatives include no coughing, ear discharge, headaches, hearing loss, neck pain, rash or sore throat. He has tried nothing for the symptoms. There is no history of a chronic ear infection, hearing loss or a tympanostomy tube.   Reviewed past Medical, Social and Family history today.  Outpatient Medications Prior to Visit  Medication Sig Dispense Refill  . acetaminophen (TYLENOL) 500 MG tablet Take 1 tablet (500 mg total) by mouth every 8 (eight) hours as needed. 30 tablet 0  . atorvastatin (LIPITOR) 20 MG tablet Take 1 tablet (20 mg total) by mouth daily at 6 PM. 90 tablet 3  . blood glucose meter kit and supplies KIT Dispense based on patient and insurance preference. Use 2-3x/week (fasting), ICD E11.42 1 each 0  . divalproex (DEPAKOTE ER) 500 MG 24 hr tablet Take 1 tablet (500 mg total) by mouth at bedtime. 90 tablet 4  . gabapentin (NEURONTIN) 300 MG capsule Take 1 capsule (300 mg total) by mouth at bedtime as needed. Please call [phone] to schedule yearly follow up appt. 90 capsule 0  . glipiZIDE (GLUCOTROL XL) 5 MG 24 hr tablet Take 1 tablet (5 mg total) by mouth daily with breakfast. 90 tablet 3  . Lancets (ONETOUCH DELICA PLUS [id number]) MISC USE AS DIRECTED. TAKE BLOOD SUGAR 2  3 TIMES PER WEEK BEFORE MEALS.    . metoprolol succinate (TOPROL-XL) 25 MG 24 hr tablet Take 0.5 tablets (12.5 mg total) by mouth daily. 90 tablet 3  . omeprazole (PRILOSEC) 20 MG capsule TAKE 1 CAPSULE (20 MG TOTAL) BY MOUTH DAILY AS NEEDED. 90 capsule 1  . ONETOUCH ULTRA test strip USE AS DIRECTED. TEST BLOOD SUGAR 2   3 TIMES PER WEEK. BEFORE EATING    .  vitamin C (ASCORBIC ACID) 500 MG tablet Take 500 mg by mouth daily.     No facility-administered medications prior to visit.    ROS See HPI  Objective:  BP 120/62   Pulse (!) 55   Temp (!) 97.3 F (36.3 C) (Tympanic)   Ht 6' 3.5" (1.918 m)   Wt 243 lb 3.2 oz (110.3 kg)   SpO2 95%   BMI 30.00 kg/m   BP Readings from Last 3 Encounters:  10/31/19 120/62  10/23/19 128/72  10/08/19 126/60    Wt Readings from Last 3 Encounters:  10/31/19 243 lb 3.2 oz (110.3 kg)  10/23/19 244 lb 12.8 oz (111 kg)  10/08/19 239 lb 3.2 oz (108.5 kg)    Physical Exam HENT:     Right Ear: Tympanic membrane, ear canal and external ear normal. No mastoid tenderness.     Nose: Nose normal.  Eyes:     Extraocular Movements: Extraocular movements intact.     Conjunctiva/sclera: Conjunctivae normal.  Neck:     Thyroid: No thyroid mass, thyromegaly or thyroid tenderness.  Cardiovascular:     Rate and Rhythm: Normal rate.     Pulses: Normal pulses.  Musculoskeletal:     Cervical back: Normal range of motion and neck supple.  Lymphadenopathy:     Cervical: No cervical adenopathy.  Skin:  Findings: No erythema or rash.  Neurological:     Mental Status: He is oriented to person, place, and time.    Lab Results  Component Value Date   WBC 6.9 10/15/2017   HGB 12.4 (L) 10/15/2017   HCT 38.8 (L) 10/15/2017   PLT 202 10/15/2017   GLUCOSE 101 (H) 10/08/2019   CHOL 130 04/11/2019   TRIG 54.0 04/11/2019   HDL 43.80 04/11/2019   LDLCALC 75 04/11/2019   ALT 6 04/11/2019   AST 16 04/11/2019   NA 139 10/08/2019   K 4.2 10/08/2019   CL 101 10/08/2019   CREATININE 1.08 10/08/2019   BUN 16 10/08/2019   CO2 31 10/08/2019   TSH 1.25 10/08/2019   PSA 0.42 11/17/2016   HGBA1C 6.2 10/08/2019   MICROALBUR 1.5 10/08/2019    Assessment & Plan:  This visit occurred during the SARS-CoV-2 public health emergency.  Safety protocols were in place, including screening questions prior to the visit,  additional usage of staff PPE, and extensive cleaning of exam room while observing appropriate contact time as indicated for disinfecting solutions.   Nicholas Rasmussen was seen today for pain.  Diagnoses and all orders for this visit:  Scalp tenderness -     C-reactive protein -     Sedimentation rate -     CBC w/Diff   I am having Nicholas Rasmussen maintain his acetaminophen, vitamin C, divalproex, gabapentin, atorvastatin, blood glucose meter kit and supplies, OneTouch Ultra, OneTouch Delica Plus [id number], metoprolol succinate, omeprazole, and glipiZIDE.  No orders of the defined types were placed in this encounter.  Problem List Items Addressed This Visit    None    Visit Diagnoses    Scalp tenderness    -  Primary   Relevant Orders   C-reactive protein   Sedimentation rate   CBC w/Diff      Follow-up: Return if symptoms worsen or fail to improve.  [name], NP

## 2019-10-31 NOTE — Chronic Care Management (AMB) (Signed)
Patient seen today in office by PCP for follow-up. After visit, patient requested advice on using his glucometer. He states he tried to use it one time since our education session but was unable to get a drop of blood. Patient did not bring glucometer supplies with him to office, but we reviewed proper use of glucometer again today. Instructed patient if runs into difficulties next time he goes to check his blood sugar to reach out to me so I can try to instruct him on how to use his lancing device and glucometer.   [place] at [facility]  [phone]

## 2019-11-01 ENCOUNTER — Telehealth: Payer: Self-pay | Admitting: Nurse Practitioner

## 2019-11-01 NOTE — Telephone Encounter (Signed)
[place]otte please advise.  Pt called stating that the pain over over right ear that he was seen for yesterday is still there and that its getting worse it seems at night. Pt is wondering if theres something else he can do to help.

## 2019-11-01 NOTE — Telephone Encounter (Signed)
Patient is calling back and stated that he still experiencing the same symptoms and wanted to know if there was something else he can do. Patient stated that symptoms are worse at night. Pls advise . CB is [phone]

## 2019-11-02 ENCOUNTER — Other Ambulatory Visit: Payer: Self-pay

## 2019-11-02 DIAGNOSIS — R42 Dizziness and giddiness: Secondary | ICD-10-CM

## 2019-11-02 DIAGNOSIS — E1141 Type 2 diabetes mellitus with diabetic mononeuropathy: Secondary | ICD-10-CM

## 2019-11-02 LAB — CBC WITH DIFFERENTIAL/PLATELET
Basophils Absolute: 0 10*3/uL (ref 0.0–0.1)
Basophils Relative: 1 % (ref 0.0–3.0)
Eosinophils Absolute: 0.1 10*3/uL (ref 0.0–0.7)
Eosinophils Relative: 1.2 % (ref 0.0–5.0)
HCT: 37.6 % — ABNORMAL LOW (ref 39.0–52.0)
Hemoglobin: 12.3 g/dL — ABNORMAL LOW (ref 13.0–17.0)
Lymphocytes Relative: 30.1 % (ref 12.0–46.0)
Lymphs Abs: 1.5 10*3/uL (ref 0.7–4.0)
MCHC: 32.8 g/dL (ref 30.0–36.0)
MCV: 83.5 fl (ref 78.0–100.0)
Monocytes Absolute: 0.5 10*3/uL (ref 0.1–1.0)
Monocytes Relative: 9.5 % (ref 3.0–12.0)
Neutro Abs: 2.9 10*3/uL (ref 1.4–7.7)
Neutrophils Relative %: 58.2 % (ref 43.0–77.0)
Platelets: 187 10*3/uL (ref 150.0–400.0)
RBC: 4.5 Mil/uL (ref 4.22–5.81)
RDW: 14.2 % (ref 11.5–15.5)
WBC: 5 10*3/uL (ref 4.0–10.5)

## 2019-11-02 LAB — C-REACTIVE PROTEIN: CRP: <1 mg/dL (ref 0.5–20.0)

## 2019-11-02 MED ORDER — MECLIZINE HCL 12.5 MG PO TABS: 12.5000 mg | ORAL_TABLET | Freq: Three times a day (TID) | ORAL | 0 refills | Status: DC | PRN

## 2019-11-02 NOTE — Addendum Note (Signed)
Addended by: Lynnea Ferrier on: 11/02/2019 07:25 AM   Modules accepted: Orders

## 2019-11-02 NOTE — Telephone Encounter (Signed)
Rx sent and pt notified of Rx.

## 2019-11-02 NOTE — Telephone Encounter (Signed)
With pending lab results, please send meclizine 12.5mg  1tab every 8hrs prn, #9, no refills

## 2019-11-12 ENCOUNTER — Other Ambulatory Visit: Payer: Self-pay | Admitting: *Deleted

## 2019-11-12 DIAGNOSIS — I25119 Atherosclerotic heart disease of native coronary artery with unspecified angina pectoris: Secondary | ICD-10-CM

## 2019-11-13 ENCOUNTER — Other Ambulatory Visit: Payer: PPO

## 2019-11-13 ENCOUNTER — Other Ambulatory Visit: Payer: Self-pay

## 2019-11-13 DIAGNOSIS — I25119 Atherosclerotic heart disease of native coronary artery with unspecified angina pectoris: Secondary | ICD-10-CM | POA: Diagnosis not present

## 2019-11-14 ENCOUNTER — Other Ambulatory Visit: Payer: Self-pay | Admitting: Interventional Cardiology

## 2019-11-14 DIAGNOSIS — I25119 Atherosclerotic heart disease of native coronary artery with unspecified angina pectoris: Secondary | ICD-10-CM

## 2019-11-14 LAB — BASIC METABOLIC PANEL
BUN/Creatinine Ratio: 13 (ref 10–24)
BUN: 13 mg/dL (ref 8–27)
CO2: 28 mmol/L (ref 20–29)
Calcium: 9 mg/dL (ref 8.6–10.2)
Chloride: 105 mmol/L (ref 96–106)
Creatinine, Ser: 1.03 mg/dL (ref 0.76–1.27)
GFR calc Af Amer: 84 mL/min/{1.73_m2} (ref >59)
GFR calc non Af Amer: 73 mL/min/{1.73_m2} (ref >59)
Glucose: 99 mg/dL (ref 65–99)
Potassium: 4.1 mmol/L (ref 3.5–5.2)
Sodium: 143 mmol/L (ref 134–144)

## 2019-11-19 ENCOUNTER — Other Ambulatory Visit: Payer: Self-pay

## 2019-11-19 ENCOUNTER — Telehealth (INDEPENDENT_AMBULATORY_CARE_PROVIDER_SITE_OTHER): Payer: PPO | Admitting: Nurse Practitioner

## 2019-11-19 ENCOUNTER — Encounter: Payer: Self-pay | Admitting: Nurse Practitioner

## 2019-11-19 VITALS — BP 143/81 | Temp 96.5°F | Ht 75.5 in | Wt 241.0 lb

## 2019-11-19 DIAGNOSIS — J069 Acute upper respiratory infection, unspecified: Secondary | ICD-10-CM

## 2019-11-19 MED ORDER — HYDROCODONE-HOMATROPINE 5-1.5 MG/5ML PO SYRP: 5.0000 mL | ORAL_SOLUTION | Freq: Three times a day (TID) | ORAL | 0 refills | Status: DC | PRN

## 2019-11-19 MED ORDER — NOREL AD 4-10-325 MG PO TABS: 1.0000 | ORAL_TABLET | Freq: Two times a day (BID) | ORAL | 0 refills | Status: DC

## 2019-11-19 NOTE — Progress Notes (Signed)
Virtual Visit via Video Note  I connected with@ on 11/19/19 at 11:30 AM EDT by a video enabled telemedicine application and verified that I am speaking with the correct person using two identifiers.  Location: Patient:Home Provider: Office Participants: patient and provider  I discussed the limitations of evaluation and management by telemedicine and the availability of in person appointments. I also discussed with the patient that there may be a patient responsible charge related to this service. The patient expressed understanding and agreed to proceed.  CC:pt reports dry cough, headaches since Friday//voice change on Saturday//pt tried Theraflu but no help//feels like a head cold   History of Present Illness: Cough This is a new problem. The current episode started in the past 7 days. The problem has been unchanged. The problem occurs constantly. The cough is non-productive. Associated symptoms include ear congestion, ear pain, headaches, nasal congestion, postnasal drip and rhinorrhea. Pertinent negatives include no chest pain, chills, fever, heartburn, hemoptysis, myalgias, rash, sore throat, shortness of breath, sweats, weight loss or wheezing.   denies any known sick contact or recent travel.  Observations/Objective: Physical Exam Vitals reviewed.  Constitutional:      General: He is not in acute distress. HENT:     Right Ear: External ear normal.     Left Ear: External ear normal.     Nose: Mucosal edema present.     Right Sinus: Maxillary sinus tenderness and frontal sinus tenderness present.     Left Sinus: Maxillary sinus tenderness and frontal sinus tenderness present.     Mouth/Throat:     Pharynx: Uvula midline.  Eyes:     General: No scleral icterus.    Extraocular Movements: Extraocular movements intact.     Conjunctiva/sclera: Conjunctivae normal.  Cardiovascular:     Rate and Rhythm: Normal rate.     Pulses: Normal pulses.  Pulmonary:     Effort: Pulmonary  effort is normal.  Neurological:     Mental Status: He is alert and oriented to person, place, and time.  Psychiatric:        Mood and Affect: Mood normal.        Behavior: Behavior normal.        Thought Content: Thought content normal.    Assessment and Plan: Dewayne was seen today for cough.  Diagnoses and all orders for this visit:  Viral upper respiratory tract infection -     HYDROcodone-homatropine (HYCODAN) 5-1.5 MG/5ML syrup; Take 5 mLs by mouth every 8 (eight) hours as needed. -     Chlorphen-PE-Acetaminophen (NOREL AD) 4-10-325 MG TABS; Take 1 tablet by mouth every 12 (twelve) hours.   Follow Up Instructions: Encourage adequate oral hydration.  Use over-the-counter  "cold" medicines  such as "Tylenol cold" , "Advil cold",  "Mucinex" or" Mucinex D"  for cough and congestion.  Avoid decongestants if you have high blood pressure. Use" Delsym" or" Robitussin" cough syrup varietis for cough.  You can use plain "Tylenol" or "Advi"l for fever, chills and achyness.   "Common cold" symptoms are usually triggered by a virus.  The antibiotics are usually not necessary. On average, a" viral cold" illness would take 4-7 days to resolve. Please, make an appointment if you are not better or if you're worse.   I discussed the assessment and treatment plan with the patient. The patient was provided an opportunity to ask questions and all were answered. The patient agreed with the plan and demonstrated an understanding of the instructions.   The patient was  advised to call back or seek an in-person evaluation if the symptoms worsen or if the condition fails to improve as anticipated.  [name], NP

## 2019-11-19 NOTE — Patient Instructions (Signed)
Encourage adequate oral hydration.  Use over-the-counter  "cold" medicines  such as "Tylenol cold" , "Advil cold",  "Mucinex" or" Mucinex D"  for cough and congestion.  Avoid decongestants if you have high blood pressure. Use" Delsym" or" Robitussin" cough syrup varietis for cough.  You can use plain "Tylenol" or "Advi"l for fever, chills and achyness.   "Common cold" symptoms are usually triggered by a virus.  The antibiotics are usually not necessary. On average, a" viral cold" illness would take 4-7 days to resolve. Please, make an appointment if you are not better or if you're worse.

## 2019-11-28 ENCOUNTER — Other Ambulatory Visit: Payer: Self-pay

## 2019-11-28 ENCOUNTER — Ambulatory Visit (INDEPENDENT_AMBULATORY_CARE_PROVIDER_SITE_OTHER)
Admission: RE | Admit: 2019-11-28 | Discharge: 2019-11-28 | Disposition: A | Discharge status: Home / Self Care | Payer: PPO | Source: Ambulatory Visit | Attending: Interventional Cardiology | Admitting: Interventional Cardiology

## 2019-11-28 DIAGNOSIS — I77810 Thoracic aortic ectasia: Secondary | ICD-10-CM | POA: Diagnosis not present

## 2019-11-28 DIAGNOSIS — I712 Thoracic aortic aneurysm, without rupture: Secondary | ICD-10-CM

## 2019-11-28 DIAGNOSIS — I7121 Aneurysm of the ascending aorta, without rupture: Secondary | ICD-10-CM

## 2019-11-28 DIAGNOSIS — J432 Centrilobular emphysema: Secondary | ICD-10-CM | POA: Diagnosis not present

## 2019-11-28 DIAGNOSIS — I2510 Atherosclerotic heart disease of native coronary artery without angina pectoris: Secondary | ICD-10-CM | POA: Diagnosis not present

## 2019-11-28 DIAGNOSIS — I700 Atherosclerosis of aorta: Secondary | ICD-10-CM | POA: Diagnosis not present

## 2019-11-28 MED ORDER — IOHEXOL 350 MG/ML SOLN
100.0000 mL | Freq: Once | INTRAVENOUS | Status: AC | PRN
  Administered 2019-11-28: 100 mL via INTRAVENOUS

## 2019-11-29 ENCOUNTER — Telehealth: Payer: Self-pay | Admitting: *Deleted

## 2019-11-29 DIAGNOSIS — I77810 Thoracic aortic ectasia: Secondary | ICD-10-CM

## 2019-11-29 DIAGNOSIS — I7121 Aneurysm of the ascending aorta, without rupture: Secondary | ICD-10-CM

## 2019-11-29 DIAGNOSIS — R911 Solitary pulmonary nodule: Secondary | ICD-10-CM

## 2019-11-29 DIAGNOSIS — I712 Thoracic aortic aneurysm, without rupture: Secondary | ICD-10-CM

## 2019-11-29 NOTE — Telephone Encounter (Signed)
Spoke with pt and reviewed results and recommendations.  Pt verbalized understanding and was in agreement with this plan.

## 2019-11-29 NOTE — Telephone Encounter (Signed)
-----   Message from Belva Crome, MD sent at 11/28/2019 12:01 PM EDT ----- Let the patient know the aortic size is stable. Repeat in 1 year. Also, has upper lobe nodules which need f/u in 1 year. A copy will be sent to [place], Charlene Brooke, NP

## 2019-12-17 ENCOUNTER — Other Ambulatory Visit: Payer: Self-pay | Admitting: Nurse Practitioner

## 2019-12-20 ENCOUNTER — Other Ambulatory Visit: Payer: Self-pay | Admitting: Neurology

## 2019-12-25 ENCOUNTER — Telehealth: Payer: Self-pay | Admitting: Nurse Practitioner

## 2019-12-25 ENCOUNTER — Other Ambulatory Visit: Payer: Self-pay

## 2019-12-25 ENCOUNTER — Ambulatory Visit (INDEPENDENT_AMBULATORY_CARE_PROVIDER_SITE_OTHER): Payer: PPO | Admitting: Nurse Practitioner

## 2019-12-25 ENCOUNTER — Encounter: Payer: Self-pay | Admitting: Nurse Practitioner

## 2019-12-25 VITALS — BP 128/68 | HR 53 | Temp 97.4°F | Ht 75.5 in | Wt 241.2 lb

## 2019-12-25 DIAGNOSIS — E1142 Type 2 diabetes mellitus with diabetic polyneuropathy: Secondary | ICD-10-CM

## 2019-12-25 DIAGNOSIS — E1141 Type 2 diabetes mellitus with diabetic mononeuropathy: Secondary | ICD-10-CM

## 2019-12-25 MED ORDER — GABAPENTIN 300 MG PO CAPS: 300.0000 mg | ORAL_CAPSULE | Freq: Every day | ORAL | 1 refills | Status: DC

## 2019-12-25 NOTE — Telephone Encounter (Signed)
Rx sent in today. 

## 2019-12-25 NOTE — Patient Instructions (Addendum)
Resume gabapentin at HS for neuropathy Also start B-complex 1tab daily to help with neuropathy.  Your next colonoscopy is due 2025.  You will be contacted to schedule appt with nutritionist

## 2019-12-25 NOTE — Telephone Encounter (Signed)
Last OV 12/25/19 Last fill06/23/20  #90/0

## 2019-12-25 NOTE — Telephone Encounter (Signed)
Patient is calling and requesting a refill for Gabapentin sent to [facility] on [address] please advise. CB is [phone]

## 2019-12-27 ENCOUNTER — Encounter: Payer: Self-pay | Admitting: Nurse Practitioner

## 2019-12-27 NOTE — Progress Notes (Signed)
Subjective:  Patient ID: Nicholas Rasmussen, male    DOB: December 10, 1945  Age: 74 y.o. MRN: [id number]  CC: Pain (pain in both calves but mainly right one//achy esp when he first wakes-PT asking when next colonoscopy due-told him 2025 he didn't think so)  HPI  Diabetic neuropathy (HCC) Chronic, persistent, worse in AM, describes as muscle ache, no change with increase exercise. No pain with weight bearing, no claudication, no swelling, no abnormal hair distribution Normal pedal pulse, normal toenails, skin intact.  Bilateral diminished sensation to microfilament and vibration Former smoker, quit over 8yr ago  [facility] and use of shoes at all times. If no improvement, consider lumbar radiculopathy vs PAD?   Reviewed past Medical, Social and Family history today.  Outpatient Medications Prior to Visit  Medication Sig Dispense Refill  . acetaminophen (TYLENOL) 500 MG tablet Take 1 tablet (500 mg total) by mouth every 8 (eight) hours as needed. 30 tablet 0  . atorvastatin (LIPITOR) 20 MG tablet TAKE 1 TABLET (20 MG TOTAL) BY MOUTH DAILY AT 6 PM. 90 tablet 3  . blood glucose meter kit and supplies KIT Dispense based on patient and insurance preference. Use 2-3x/week (fasting), ICD E11.42 1 each 0  . Chlorphen-PE-Acetaminophen (NOREL AD) 4-10-325 MG TABS Take 1 tablet by mouth every 12 (twelve) hours. 6 tablet 0  . divalproex (DEPAKOTE ER) 500 MG 24 hr tablet Take 1 tablet (500 mg total) by mouth at bedtime. Please call [phone] appt or you may speak to your PCP about refills. 90 tablet 0  . glipiZIDE (GLUCOTROL XL) 5 MG 24 hr tablet Take 1 tablet (5 mg total) by mouth daily with breakfast. 90 tablet 3  . metoprolol succinate (TOPROL-XL) 25 MG 24 hr tablet Take 0.5 tablets (12.5 mg total) by mouth daily. 90 tablet 3  . omeprazole (PRILOSEC) 20 MG capsule TAKE 1 CAPSULE (20 MG TOTAL) BY MOUTH DAILY AS NEEDED. 90 capsule 1  . ONETOUCH ULTRA test  strip USE AS DIRECTED. TEST BLOOD SUGAR 2 - 3 TIMES PER WEEK. BEFORE EATING 25 strip 1  . vitamin C (ASCORBIC ACID) 500 MG tablet Take 500 mg by mouth daily.    .Marland Kitchengabapentin (NEURONTIN) 300 MG capsule Take 1 capsule (300 mg total) by mouth at bedtime as needed. Please call [phone] schedule yearly follow up appt. 90 capsule 0  . Lancets (ONETOUCH DELICA PLUS [id number] MISC USE AS DIRECTED. TAKE BLOOD SUGAR 2  3 TIMES PER WEEK BEFORE MEALS.    . HYDROcodone-homatropine (HYCODAN) 5-1.5 MG/5ML syrup Take 5 mLs by mouth every 8 (eight) hours as needed. (Patient not taking: Reported on 12/25/2019) 60 mL 0  . meclizine (ANTIVERT) 12.5 MG tablet Take 1 tablet (12.5 mg total) by mouth 3 (three) times daily as needed for dizziness. Take 1 tablet every 8 hours as needed for pain. (Patient not taking: Reported on 12/25/2019) 9 tablet 0   No facility-administered medications prior to visit.    ROS See HPI  Objective:  BP 128/68   Pulse (!) 53   Temp (!) 97.4 F (36.3 C) (Tympanic)   Ht 6' 3.5" (1.918 m)   Wt 241 lb 3.2 oz (109.4 kg)   SpO2 96%   BMI 29.75 kg/m   Physical Exam Physical Exam Cardiovascular:     Rate and Rhythm: Normal rate.     Pulses: Normal pulses.          Dorsalis pedis pulses are 2+  on the right side and 2+ on the left side.       Posterior tibial pulses are 2+ on the right side and 2+ on the left side.  Musculoskeletal:        General: No swelling or tenderness. Normal range of motion.     Right lower leg: No edema.     Left lower leg: No edema.  Skin:    General: Skin is warm and dry.     Findings: No erythema or rash.     Comments: Abnormal diabetic foot exam. Decreased microfilament and vibration sensation (bilateral)  Neurological:     Mental Status: He is alert and oriented to person, place, and time.    Assessment & Plan:  This visit occurred during the SARS-CoV-2 public health emergency.  Safety protocols were in place, including screening questions prior  to the visit, additional usage of staff PPE, and extensive cleaning of exam room while observing appropriate contact time as indicated for disinfecting solutions.   [name] was seen today for pain.  Diagnoses and all orders for this visit:  Diabetic mononeuropathy associated with type 2 diabetes mellitus ([place])  Type 2 diabetes mellitus with diabetic polyneuropathy, without long-term current use of insulin ([place]) -     Referral to Nutrition and Diabetes Services   Problem List Items Addressed This Visit      Endocrine   Diabetic neuropathy ([place]) - Primary    Chronic, persistent, worse in AM, describes as muscle ache, no change with increase exercise. No pain with weight bearing, no claudication, no swelling, no abnormal hair distribution Normal pedal pulse, normal toenails, skin intact.  Bilateral diminished sensation to microfilament and vibration Former smoker, quit over 12yr ago  [facility] and use of shoes at all times. If no improvement, consider lumbar radiculopathy vs PAD?      Type 2 diabetes mellitus with diabetic polyneuropathy, without long-term current use of insulin (HCC)   Relevant Orders   Referral to Nutrition and Diabetes Services      Follow-up: No follow-ups on file.  CWilfred Lacy NP

## 2019-12-29 ENCOUNTER — Other Ambulatory Visit: Payer: Self-pay | Admitting: Nurse Practitioner

## 2020-01-01 NOTE — Progress Notes (Signed)
Subjective:   Nicholas Rasmussen is a 74 y.o. male who presents for Medicare Annual/Subsequent preventive examination.  I connected with Alek today by telephone and verified that I am speaking with the correct person using two identifiers. Location patient: home Location provider: work Persons participating in the virtual visit: patient, Marine scientist.    I discussed the limitations, risks, security and privacy concerns of performing an evaluation and management service by telephone and the availability of in person appointments. I also discussed with the patient that there may be a patient responsible charge related to this service. The patient expressed understanding and verbally consented to this telephonic visit.    Interactive audio and video telecommunications were attempted between this provider and patient, however failed, due to patient having technical difficulties OR patient did not have access to video capability.  We continued and completed visit with audio only.  Some vital signs may be absent or patient reported.   Time Spent with patient on telephone encounter: 20 minutes  Review of Systems:   Cardiac Risk Factors include: advanced age (>25mn, >>56women);male gender;diabetes mellitus     Objective:    Vitals: Ht 6' 3.5" (1.918 m)   Wt 241 lb (109.3 kg)   BMI 29.73 kg/m   Body mass index is 29.73 kg/m.  Advanced Directives 01/02/2020 02/20/2019 10/26/2017 01/19/2017 11/17/2016 07/26/2016 07/17/2016  Does Patient Have a Medical Advance Directive? No No No No No No No  Would patient like information on creating a medical advance directive? Yes (MAU/Ambulatory/Procedural Areas - Information given) No - Patient declined - - Yes (ED - Information included in AVS) No - Patient declined No - Patient declined    Tobacco Social History   Tobacco Use  Smoking Status Former Smoker  Smokeless Tobacco Never Used     Counseling given: Not Answered   Clinical Intake:  Pre-visit  preparation completed: Yes  Pain : 0-10 Pain Score: 3  Pain Type: Chronic pain Pain Location: Back Pain Onset: More than a month ago Pain Frequency: Intermittent     Nutritional Status: BMI 25 -29 Overweight Nutritional Risks: None Diabetes: Yes CBG done?: No Did pt. bring in CBG monitor from home?: No  What is the last grade level you completed in school?: 238yrof college  Nutrition Risk Assessment:  Has the patient had any N/V/D within the last 2 months?  No  Does the patient have any non-healing wounds?  No  Has the patient had any unintentional weight loss or weight gain?  No   Diabetes:  Is the patient diabetic?  Yes  If diabetic, was a CBG obtained today?  No  Did the patient bring in their glucometer from home?  No phone visit How often do you monitor your CBG's? never.   Financial Strains and Diabetes Management:  Are you having any financial strains with the device, your supplies or your medication? No .  Does the patient want to be seen by Chronic Care Management for management of their diabetes?  No  Would the patient like to be referred to a Nutritionist or for Diabetic Management?  No   Diabetic Exams:  Diabetic Eye Exam: Completed 10/17/2019 .   Diabetic Foot Exam: Completed 04/11/2019.   Interpreter Needed?: No  Information entered by :: MaCaroleen HammanPN  Past Medical History:  Diagnosis Date  . Allergic rhinitis 05/07/2014  . Dizziness 01/02/2016  . GERD (gastroesophageal reflux disease)   . Headache 03/13/2015  . Left flank pain 01/02/2016  .  Low back pain 06/06/2015  . Pain of left side of body 08/13/2015  . Post herpetic neuralgia   . Routine general medical examination at a health care facility 05/07/2014  . Seizure disorder ([place]) 05/07/2014  . Seizures ([place])    Last seizure in the 70s.  . Shingles   . Stye 05/07/2014   Past Surgical History:  Procedure Laterality Date  . COLONOSCOPY    . KNEE SURGERY Left    left miniscus  .  UPPER GASTROINTESTINAL ENDOSCOPY    . uvala surgery     Family History  Problem Relation Age of Onset  . Diabetes Father   . Hypertension Father   . Other Mother        natural causes   Social History   Socioeconomic History  . Marital status: Single    Spouse name: Not on file  . Number of children: 3  . Years of education: Associates  . Highest education level: Not on file  Occupational History  . Occupation: Retired    Comment: Oceanographer  Tobacco Use  . Smoking status: Former Research scientist (life sciences)  . Smokeless tobacco: Never Used  Vaping Use  . Vaping Use: Never used  Substance and Sexual Activity  . Alcohol use: Yes    Alcohol/week: 0.0 standard drinks    Comment: occasional  . Drug use: No  . Sexual activity: Not on file  Other Topics Concern  . 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   Right-handed.   Rarely drinks caffeine.   Social Determinants of Health   Financial Resource Strain: Low Risk   . Difficulty of Paying Living Expenses: Not hard at all  Food Insecurity: No Food Insecurity  . Worried About Charity fundraiser in the Last Year: Never true  . Ran Out of Food in the Last Year: Never true  Transportation Needs: No Transportation Needs  . Lack of Transportation (Medical): No  . Lack of Transportation (Non-Medical): No  Physical Activity: Sufficiently Active  . Days of Exercise per Week: 3 days  . Minutes of Exercise per Session: 60 min  Stress: No Stress Concern Present  . Feeling of Stress : Not at all  Social Connections: Moderately Integrated  . Frequency of Communication with Friends and Family: More than three times a week  . Frequency of Social Gatherings with Friends and Family: More than three times a week  . Attends Religious Services: More than 4 times per year  . Active Member of Clubs or Organizations: Yes   . Attends Archivist Meetings: Not on file  . Marital Status: Divorced    Outpatient Encounter Medications as of 01/02/2020  Medication Sig  . acetaminophen (TYLENOL) 500 MG tablet Take 1 tablet (500 mg total) by mouth every 8 (eight) hours as needed.  Marland Kitchen atorvastatin (LIPITOR) 20 MG tablet TAKE 1 TABLET (20 MG TOTAL) BY MOUTH DAILY AT 6 PM.  . blood glucose meter kit and supplies KIT Dispense based on patient and insurance preference. Use 2-3x/week (fasting), ICD E11.42  . divalproex (DEPAKOTE ER) 500 MG 24 hr tablet Take 1 tablet (500 mg total) by mouth at bedtime. Please call [phone] for appt or you may speak to your PCP about refills.  . gabapentin (NEURONTIN) 300 MG capsule Take 1 capsule (300 mg total) by mouth at bedtime. Please  call [phone] to schedule yearly follow up appt.  Marland Kitchen glipiZIDE (GLUCOTROL XL) 5 MG 24 hr tablet Take 1 tablet (5 mg total) by mouth daily with breakfast.  . Lancets (ONETOUCH DELICA PLUS [id number]) MISC USE AS DIRECTED. TAKE BLOOD SUGAR 2 -3 TIMES PER WEEK BEFORE MEALS.  . metoprolol succinate (TOPROL-XL) 25 MG 24 hr tablet Take 0.5 tablets (12.5 mg total) by mouth daily.  Marland Kitchen omeprazole (PRILOSEC) 20 MG capsule TAKE 1 CAPSULE (20 MG TOTAL) BY MOUTH DAILY AS NEEDED.  Marland Kitchen ONETOUCH ULTRA test strip USE AS DIRECTED. TEST BLOOD SUGAR 2 - 3 TIMES PER WEEK. BEFORE EATING  . vitamin C (ASCORBIC ACID) 500 MG tablet Take 500 mg by mouth daily.  Marland Kitchen HYDROcodone-homatropine (HYCODAN) 5-1.5 MG/5ML syrup Take 5 mLs by mouth every 8 (eight) hours as needed. (Patient not taking: Reported on 12/25/2019)  . meclizine (ANTIVERT) 12.5 MG tablet Take 1 tablet (12.5 mg total) by mouth 3 (three) times daily as needed for dizziness. Take 1 tablet every 8 hours as needed for pain. (Patient not taking: Reported on 12/25/2019)  . [DISCONTINUED] Chlorphen-PE-Acetaminophen (NOREL AD) 4-10-325 MG TABS Take 1 tablet by mouth every 12 (twelve) hours.   No facility-administered encounter  medications on file as of 01/02/2020.    Activities of Daily Living In your present state of health, do you have any difficulty performing the following activities: 01/02/2020  Hearing? N  Vision? N  Difficulty concentrating or making decisions? N  Walking or climbing stairs? N  Dressing or bathing? N  Doing errands, shopping? N  Preparing Food and eating ? N  Using the Toilet? N  In the past six months, have you accidently leaked urine? N  Do you have problems with loss of bowel control? N  Managing your Medications? N  Managing your Finances? N  Housekeeping or managing your Housekeeping? N  Some recent data might be hidden    Patient Care Team: Nche, Charlene Brooke, NP as PCP - General (Internal Medicine) Belva Crome, MD as PCP - Cardiology (Cardiology) Germaine Pomfret, [facility] as Pharmacist (Pharmacist)   Assessment:   This is a routine wellness examination for Dastan.  Exercise Activities and Dietary recommendations Current Exercise Habits: Home exercise routine, Type of exercise: Other - see comments (dancing), Time (Minutes): 60, Frequency (Times/Week): 3, Weekly Exercise (Minutes/Week): 180, Intensity: Mild  Goals Addressed            This Visit's Progress   . I want to remain healthy as possible and travel   On track    Continue to be active, exercise, eat healthy, enjoy life, family, and travel as much as possible       Fall Risk Fall Risk  01/02/2020 10/08/2019 08/16/2018 12/29/2017 02/03/2017  Falls in the past year? 0 0 0 No No  Number falls in past yr: 0 0 - - -  Injury with Fall? 0 0 - - -  Risk for fall due to : No Fall Risks - - - -   FALL RISK PREVENTION PERTAINING TO THE HOME:  Any stairs in or around the home? Yes  If so, are there any without handrails? No   Home free of loose throw rugs in walkways, pet beds, electrical cords, etc? Yes  Adequate lighting in your home to reduce risk of falls? Yes   ASSISTIVE DEVICES UTILIZED TO PREVENT FALLS:   Life alert? No  Use of a cane, walker or w/c? No  Grab bars in the bathroom? Yes  Shower chair or bench in shower? No  Elevated toilet seat or a handicapped toilet? No    TIMED UP AND GO:  Was the test performed? No . Phone visit    Depression Screen PHQ 2/9 Scores 01/02/2020 10/08/2019 08/16/2018 12/29/2017  PHQ - 2 Score 0 0 0 0    Cognitive Function: No cognitive impairment noted. Patient plays games on his phone for brain health.        Immunization History  Administered Date(s) Administered  . Fluad Quad(high Dose 65+) 02/01/2019  . Influenza Whole 02/21/2014  . Influenza, High Dose Seasonal PF 02/03/2016, 02/03/2017, 02/28/2018  . Influenza,inj,Quad PF,6+ Mos 03/13/2015  . PFIZER SARS-COV-2 Vaccination 07/22/2019, 08/21/2019  . Pneumococcal Conjugate-13 05/06/2015  . Pneumococcal Polysaccharide-23 05/07/2014  . Tdap 05/25/2011  . Zoster Recombinat (Shingrix) 12/03/2016, 02/16/2017    Qualifies for Shingles Vaccine?Completed Zostavax & Shingrix   Tdap: Up to Date  Flu Vaccine: Due 01/2020  Pneumococcal Vaccine: Up to Date  Covid-19 Vaccine: Completed vaccines  Screening Tests Health Maintenance  Topic Date Due  . INFLUENZA VACCINE  12/23/2019  . HEMOGLOBIN A1C  04/09/2020  . FOOT EXAM  04/10/2020  . URINE MICROALBUMIN  10/07/2020  . OPHTHALMOLOGY EXAM  10/16/2020  . TETANUS/TDAP  05/24/2021  . COLONOSCOPY  01/23/2024  . COVID-19 Vaccine  Completed  . Hepatitis C Screening  Completed  . PNA vac Low Risk Adult  Completed   Cancer Screenings:  Colorectal Screening: Completed 01/22/2014. Repeat every 10 years.  Lung Cancer Screening: (Low Dose CT Chest recommended if Age 102-80 years, 30 pack-year currently smoking OR have quit w/in 15years.) does not qualify.    Additional Screening:  Hepatitis C Screening: Completed 10/28/2015  Dental Screening: Recommended annual dental exams for proper oral hygiene  Community Resource Referral:  CRR required this  visit?  No        Plan:  I have personally reviewed and addressed the Medicare Annual Wellness questionnaire and have noted the following in the patient's chart:  A. Medical and social history B. Use of alcohol, tobacco or illicit drugs  C. Current medications and supplements D. Functional ability and status E.  Nutritional status F.  Physical activity G. Advance directives H. List of other physicians I.  Hospitalizations, surgeries, and ER visits in previous 12 months J.  [place] such as hearing and vision if needed, cognitive and depression L. Referrals and appointments   In addition, I have reviewed and discussed with patient certain preventive protocols, quality metrics, and best practice recommendations. A written personalized care plan for preventive services as well as general preventive health recommendations were provided to patient.  Due to this being a telephonic visit, the after visit summary with patients personalized plan was offered to patient via mail or my-chart. Patient would like to access on my-chart.   Signed,   Marta Antu, LPN  [id number] Nurse Health Advisor  Nurse Notes: Patient requesting an order to go to physical therapy again for his back. Message sent to PCP

## 2020-01-01 NOTE — Assessment & Plan Note (Addendum)
Chronic, persistent, worse in AM, describes as muscle ache, no change with increase exercise. No pain with weight bearing, no claudication, no swelling, no abnormal hair distribution Normal pedal pulse, normal toenails, skin intact.  Bilateral diminished sensation to microfilament and vibration Former smoker, quit over 65yrs ago  Start gabapentin at [facility] about proper foot care and use of shoes at all times. If no improvement, consider lumbar radiculopathy vs PAD?

## 2020-01-02 ENCOUNTER — Ambulatory Visit (INDEPENDENT_AMBULATORY_CARE_PROVIDER_SITE_OTHER): Payer: PPO

## 2020-01-02 ENCOUNTER — Telehealth: Payer: Self-pay

## 2020-01-02 VITALS — Ht 75.5 in | Wt 241.0 lb

## 2020-01-02 DIAGNOSIS — Z0000 Encounter for general adult medical examination without abnormal findings: Secondary | ICD-10-CM

## 2020-01-02 DIAGNOSIS — M545 Low back pain, unspecified: Secondary | ICD-10-CM

## 2020-01-02 DIAGNOSIS — G8929 Other chronic pain: Secondary | ICD-10-CM

## 2020-01-02 NOTE — Patient Instructions (Signed)
Nicholas Rasmussen , Thank you for taking time to complete your Medicare Wellness Visit. I appreciate your ongoing commitment to your health goals. Please review the following plan we discussed and let me know if I can assist you in the future.   Screening recommendations/referrals: Colonoscopy: Completed 02/05/2014-Due 02/05/2024 Recommended yearly ophthalmology/optometry visit for glaucoma screening and checkup Recommended yearly dental visit for hygiene and checkup  Vaccinations: Influenza vaccine: Due 01/2020 Pneumococcal vaccine: Completed vaccines Tdap vaccine: Up to Date- Due-05/25/2011 Shingles vaccine: Completed vaccines   Covid-19: Completed vaccines  Advanced directives: Discussed. Information mailed today.  Conditions/risks identified: See problem list  Next appointment: Follow up in one year for your annual wellness visit.  01/07/21 @ 1:30pm  Preventive Care 72 Years and Older, Male Preventive care refers to lifestyle choices and visits with your health care provider that can promote health and wellness. What does preventive care include?  A yearly physical exam. This is also called an annual well check.  Dental exams once or twice a year.  Routine eye exams. Ask your health care provider how often you should have your eyes checked.  Personal lifestyle choices, including:  Daily care of your teeth and gums.  Regular physical activity.  Eating a healthy diet.  Avoiding tobacco and drug use.  Limiting alcohol use.  Practicing safe sex.  Taking low doses of aspirin every day.  Taking vitamin and mineral supplements as recommended by your health care provider. What happens during an annual well check? The services and screenings done by your health care provider during your annual well check will depend on your age, overall health, lifestyle risk factors, and family history of disease. Counseling  Your health care provider may ask you questions about your:  Alcohol  use.  Tobacco use.  Drug use.  Emotional well-being.  Home and relationship well-being.  Sexual activity.  Eating habits.  History of falls.  Memory and ability to understand (cognition).  Work and work Statistician. Screening  You may have the following tests or measurements:  Height, weight, and BMI.  Blood pressure.  Lipid and cholesterol levels. These may be checked every 5 years, or more frequently if you are over 67 years old.  Skin check.  Lung cancer screening. You may have this screening every year starting at age 72 if you have a 30-pack-year history of smoking and currently smoke or have quit within the past 15 years.  Fecal occult blood test (FOBT) of the stool. You may have this test every year starting at age 72.  Flexible sigmoidoscopy or colonoscopy. You may have a sigmoidoscopy every 5 years or a colonoscopy every 10 years starting at age 72.  Prostate cancer screening. Recommendations will vary depending on your family history and other risks.  Hepatitis C blood test.  Hepatitis B blood test.  Sexually transmitted disease (STD) testing.  Diabetes screening. This is done by checking your blood sugar (glucose) after you have not eaten for a while (fasting). You may have this done every 1-3 years.  Abdominal aortic aneurysm (AAA) screening. You may need this if you are a current or former smoker.  Osteoporosis. You may be screened starting at age 72 if you are at high risk. Talk with your health care provider about your test results, treatment options, and if necessary, the need for more tests. Vaccines  Your health care provider may recommend certain vaccines, such as:  Influenza vaccine. This is recommended every year.  Tetanus, diphtheria, and acellular pertussis (Tdap, Td)  vaccine. You may need a Td booster every 10 years.  Zoster vaccine. You may need this after age 72.  Pneumococcal 13-valent conjugate (PCV13) vaccine. One dose is  recommended after age 72.  Pneumococcal polysaccharide (PPSV23) vaccine. One dose is recommended after age 72. Talk to your health care provider about which screenings and vaccines you need and how often you need them. This information is not intended to replace advice given to you by your health care provider. Make sure you discuss any questions you have with your health care provider. Document Released: 06/06/2015 Document Revised: 01/28/2016 Document Reviewed: 03/11/2015 Elsevier Interactive Patient Education  2017 [place] Prevention in the Home Falls can cause injuries. They can happen to people of all ages. There are many things you can do to make your home safe and to help prevent falls. What can I do on the outside of my home?  Regularly fix the edges of walkways and driveways and fix any cracks.  Remove anything that might make you trip as you walk through a door, such as a raised step or threshold.  Trim any bushes or trees on the path to your home.  Use bright outdoor lighting.  Clear any walking paths of anything that might make someone trip, such as rocks or tools.  Regularly check to see if handrails are loose or broken. Make sure that both sides of any steps have handrails.  Any raised decks and porches should have guardrails on the edges.  Have any leaves, snow, or ice cleared regularly.  Use sand or salt on walking paths during winter.  Clean up any spills in your garage right away. This includes oil or grease spills. What can I do in the bathroom?  Use night lights.  Install grab bars by the toilet and in the tub and shower. Do not use towel bars as grab bars.  Use non-skid mats or decals in the tub or shower.  If you need to sit down in the shower, use a plastic, non-slip stool.  Keep the floor dry. Clean up any water that spills on the floor as soon as it happens.  Remove soap buildup in the tub or shower regularly.  Attach bath mats  securely with double-sided non-slip rug tape.  Do not have throw rugs and other things on the floor that can make you trip. What can I do in the bedroom?  Use night lights.  Make sure that you have a light by your bed that is easy to reach.  Do not use any sheets or blankets that are too big for your bed. They should not hang down onto the floor.  Have a firm chair that has side arms. You can use this for support while you get dressed.  Do not have throw rugs and other things on the floor that can make you trip. What can I do in the kitchen?  Clean up any spills right away.  Avoid walking on wet floors.  Keep items that you use a lot in easy-to-reach places.  If you need to reach something above you, use a strong step stool that has a grab bar.  Keep electrical cords out of the way.  Do not use floor polish or wax that makes floors slippery. If you must use wax, use non-skid floor wax.  Do not have throw rugs and other things on the floor that can make you trip. What can I do with my stairs?  Do not leave  any items on the stairs.  Make sure that there are handrails on both sides of the stairs and use them. Fix handrails that are broken or loose. Make sure that handrails are as long as the stairways.  Check any carpeting to make sure that it is firmly attached to the stairs. Fix any carpet that is loose or worn.  Avoid having throw rugs at the top or bottom of the stairs. If you do have throw rugs, attach them to the floor with carpet tape.  Make sure that you have a light switch at the top of the stairs and the bottom of the stairs. If you do not have them, ask someone to add them for you. What else can I do to help prevent falls?  Wear shoes that:  Do not have high heels.  Have rubber bottoms.  Are comfortable and fit you well.  Are closed at the toe. Do not wear sandals.  If you use a stepladder:  Make sure that it is fully opened. Do not climb a closed  stepladder.  Make sure that both sides of the stepladder are locked into place.  Ask someone to hold it for you, if possible.  Clearly mark and make sure that you can see:  Any grab bars or handrails.  First and last steps.  Where the edge of each step is.  Use tools that help you move around (mobility aids) if they are needed. These include:  Canes.  Walkers.  Scooters.  Crutches.  Turn on the lights when you go into a dark area. Replace any light bulbs as soon as they burn out.  Set up your furniture so you have a clear path. Avoid moving your furniture around.  If any of your floors are uneven, fix them.  If there are any pets around you, be aware of where they are.  Review your medicines with your doctor. Some medicines can make you feel dizzy. This can increase your chance of falling. Ask your doctor what other things that you can do to help prevent falls. This information is not intended to replace advice given to you by your health care provider. Make sure you discuss any questions you have with your health care provider. Document Released: 03/06/2009 Document Revised: 10/16/2015 Document Reviewed: 06/14/2014 Elsevier Interactive Patient Education  2017 [employer].

## 2020-01-07 NOTE — Progress Notes (Signed)
Medical screening examination/treatment/procedure(s) were performed by the Wellness Coach,LPN. As primary care provider I was immediately available for consulation/collaboration. I agree with above documentation. Deztinee Lohmeyer, AGNP-C 

## 2020-01-07 NOTE — Telephone Encounter (Signed)
Referral entered  

## 2020-01-08 ENCOUNTER — Encounter: Payer: Self-pay | Admitting: Physical Therapy

## 2020-01-08 ENCOUNTER — Other Ambulatory Visit: Payer: Self-pay

## 2020-01-08 ENCOUNTER — Ambulatory Visit: Payer: PPO | Attending: Nurse Practitioner | Admitting: Physical Therapy

## 2020-01-08 DIAGNOSIS — M79604 Pain in right leg: Secondary | ICD-10-CM | POA: Diagnosis not present

## 2020-01-08 DIAGNOSIS — M62830 Muscle spasm of back: Secondary | ICD-10-CM | POA: Diagnosis not present

## 2020-01-08 DIAGNOSIS — M545 Low back pain, unspecified: Secondary | ICD-10-CM

## 2020-01-08 DIAGNOSIS — M79605 Pain in left leg: Secondary | ICD-10-CM

## 2020-01-08 NOTE — Therapy (Signed)
[facility] [place], [place], [address] Phone: [phone]   Fax:  [phone]  Physical Therapy Evaluation  Patient Details  Name: Nicholas Rasmussen MRN: [id number] Date of Birth: [birth year] Referring Provider (PT): Nche   Encounter Date: 01/08/2020   PT End of Session - 01/08/20 1333    Visit Number 1    Date for PT Re-Evaluation 03/09/20    Authorization Type [employer]    PT Start Time 1311    PT Stop Time 1400    PT Time Calculation (min) 49 min    Activity Tolerance Patient tolerated treatment well    Behavior During Therapy [facility] for tasks assessed/performed           Past Medical History:  Diagnosis Date  . Allergic rhinitis 05/07/2014  . Dizziness 01/02/2016  . GERD (gastroesophageal reflux disease)   . Headache 03/13/2015  . Left flank pain 01/02/2016  . Low back pain 06/06/2015  . Pain of left side of body 08/13/2015  . Post herpetic neuralgia   . Routine general medical examination at a health care facility 05/07/2014  . Seizure disorder ([place]) 05/07/2014  . Seizures ([place])    Last seizure in the 70s.  . Shingles   . Stye 05/07/2014    Past Surgical History:  Procedure Laterality Date  . COLONOSCOPY    . KNEE SURGERY Left    left miniscus  . UPPER GASTROINTESTINAL ENDOSCOPY    . uvala surgery      There were no vitals filed for this visit.    Subjective Assessment - 01/08/20 1313    Subjective Patient was seen here last year for LBP, e discharged wtih all goals met and he reports that he was doing great up until about a month ago, he reports that he has LBP, some leg pain and cramping.  Reports that he has difficulty with posture.    Limitations Sitting;Standing;Walking;Lifting;House hold activities    Patient Stated Goals less pain, be stronger    Currently in Pain? Yes    Pain Score 3     Pain Location Back    Pain Orientation Lower    Pain Descriptors / Indicators  Aching;Sore;Tightness    Pain Type Acute pain    Pain Radiating Towards reports tightness in the calves    Pain Onset More than a month ago    Pain Frequency Constant    Aggravating Factors  sitting, bending, extending pain up to 6/10    Pain Relieving Factors lie down, rest, pain can be 2/10    Effect of Pain on Daily Activities difficulty sitting with good posture, difficulty extending              [facility] PT Assessment - 01/08/20 0001      Assessment   Medical Diagnosis LBP, leg pain    Referring Provider (PT) Nche    Onset Date/Surgical Date 12/08/19    Prior Therapy about a year ago with good results      Precautions   Precautions None      Balance Screen   Has the patient fallen in the past 6 months No    Has the patient had a decrease in activity level because of a fear of falling?  No    Is the patient reluctant to leave their home because of a fear of falling?  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 and reports that he has to help lift him up from a chair at times      Prior Function   Level of Independence Independent    Vocation Retired    Leisure urban ball room dance 2x/week,       Posture/Postural Control   Posture Comments fwd head, very slouched posture      ROM / Strength   AROM / PROM / Strength AROM;Strength      AROM   Overall AROM Comments Lumbar ROM WFL's for flexion, decreased 50% with extension and side bending , pain with these      Strength   Overall Strength Comments 4/5      Flexibility   Soft Tissue Assessment /Muscle Length yes    Hamstrings mild tightness    Quadriceps tight    ITB tight    Piriformis very tight      Palpation   Palpation comment he is tender in the L/S area, some tenderness in the base of the calf bilaterally                      Objective measurements completed on examination: See above findings.        [place] Adult PT Treatment/Exercise - 01/08/20 0001      Modalities   Modalities Electrical Stimulation;Moist Heat      Moist Heat Therapy   Number Minutes Moist Heat 12 Minutes    Moist Heat Location Lumbar Spine      Electrical Stimulation   Electrical Stimulation Location Lumbar    Electrical Stimulation Action IFC    Electrical Stimulation Parameters supine    Electrical Stimulation Goals Pain                    PT Short Term Goals - 01/08/20 1336      PT SHORT TERM GOAL #1   Title independent with initial HEP    Time 2    Period Weeks    Status New             PT Long Term Goals - 01/08/20 1337      PT LONG TERM GOAL #1   Title understand posture and body mechanics    Time 8    Period Weeks    Status New      PT LONG TERM GOAL #2   Title decrease pain 50%    Time 8    Period Weeks    Status New      PT LONG TERM GOAL #3   Title increase lumbar extension 25% without pain    Time 8    Period Weeks    Status New      PT LONG TERM GOAL #4   Title demonstrate proper sitting posture for 10 minutes    Time 8    Period Weeks    Status New                  Plan - 01/08/20 1334    Clinical Impression Statement Pateitn was seen here last year with good results for back pain, reports that he was doing great after PT but feels he reverted back to bad habits of poor posture and reports that over the past month or so he has had some increased LBP and some calf pain.  Reports that he was diagnosed with neuropathy recently.  Pain is mostly with  lumbar extension and side bending.  Very tight ITB and piriformis.  He has very poor slouched sitting posture    Stability/Clinical Decision Making Stable/Uncomplicated    Clinical Decision Making Low           Patient will benefit from skilled therapeutic intervention in order to improve the following deficits and impairments:  Decreased range of motion, Difficulty walking, Increased muscle spasms, Pain,  Improper body mechanics, Impaired flexibility, Decreased strength, Postural dysfunction  Visit Diagnosis: Acute bilateral low back pain without sciatica - Plan: PT plan of care cert/re-cert  Muscle spasm of back - Plan: PT plan of care cert/re-cert  Pain in left leg - Plan: PT plan of care cert/re-cert  Pain in right leg - Plan: PT plan of care cert/re-cert     Problem List Patient Active Problem List   Diagnosis Date Noted  . Varicose veins of both lower extremities with pain 02/02/2019  . Encounter for therapeutic drug level monitoring 09/19/2018  . Type 2 diabetes mellitus with diabetic polyneuropathy, without long-term current use of insulin ([place]) 03/29/2018  . Diabetic neuropathy ([place]) 03/29/2018  . CAD (coronary artery disease) 08/03/2017  . Post herpetic neuralgia 07/29/2016  . Dizziness 01/02/2016  . Chronic midline low back pain without sciatica 06/06/2015  . GERD (gastroesophageal reflux disease) 11/15/2014  . Seizure disorder ([place]) 05/07/2014  . Allergic rhinitis 05/07/2014  . Routine general medical examination at a health care facility 05/07/2014    Sumner Boast., PT 01/08/2020, 1:39 PM  [place], [place], [address] Phone: [phone]   Fax:  [phone]  Name: Aadam Zhen MRN: [id number] Date of Birth: 20-Oct-1945

## 2020-01-15 ENCOUNTER — Encounter: Payer: Self-pay | Admitting: Physical Therapy

## 2020-01-15 ENCOUNTER — Other Ambulatory Visit: Payer: Self-pay

## 2020-01-15 ENCOUNTER — Ambulatory Visit: Payer: PPO | Admitting: Physical Therapy

## 2020-01-15 DIAGNOSIS — M62830 Muscle spasm of back: Secondary | ICD-10-CM

## 2020-01-15 DIAGNOSIS — M79604 Pain in right leg: Secondary | ICD-10-CM

## 2020-01-15 DIAGNOSIS — M545 Low back pain, unspecified: Secondary | ICD-10-CM

## 2020-01-15 DIAGNOSIS — M79605 Pain in left leg: Secondary | ICD-10-CM

## 2020-01-15 NOTE — Therapy (Signed)
[place], [place], [address] Phone: [phone]   Fax:  [phone]  Physical Therapy Treatment  Patient Details  Name: Nicholas Rasmussen MRN: [id number] Date of Birth: 08-Sep-1945 Referring Provider (PT): Nche   Encounter Date: 01/15/2020   PT End of Session - 01/15/20 1054    Visit Number 2    Date for PT Re-Evaluation 03/09/20    Authorization Type [employer]    PT Start Time 1013    PT Stop Time 1105    PT Time Calculation (min) 52 min    Activity Tolerance Patient tolerated treatment well    Behavior During Therapy [facility] for tasks assessed/performed           Past Medical History:  Diagnosis Date  . Allergic rhinitis 05/07/2014  . Dizziness 01/02/2016  . GERD (gastroesophageal reflux disease)   . Headache 03/13/2015  . Left flank pain 01/02/2016  . Low back pain 06/06/2015  . Pain of left side of body 08/13/2015  . Post herpetic neuralgia   . Routine general medical examination at a health care facility 05/07/2014  . Seizure disorder ([place]) 05/07/2014  . Seizures ([place])    Last seizure in the 70s.  . Shingles   . Stye 05/07/2014    Past Surgical History:  Procedure Laterality Date  . COLONOSCOPY    . KNEE SURGERY Left    left miniscus  . UPPER GASTROINTESTINAL ENDOSCOPY    . uvala surgery      There were no vitals filed for this visit.   Subjective Assessment - 01/15/20 1013    Subjective doing ok, pain not as severe    Currently in Pain? Yes    Pain Score 5     Pain Location Back                             [facility] Adult PT Treatment/Exercise - 01/15/20 0001      Exercises   Exercises Lumbar      Lumbar Exercises: Aerobic   Recumbent Bike L1 x 5 min       Lumbar Exercises: Machines for Strengthening   Cybex Lumbar Extension black band 2x10     Cybex Knee Extension 10lb 2x10     Cybex Knee Flexion 35lb 2x10    Other Lumbar Machine  Exercise Rows & Lats 35lb 2x10      Lumbar Exercises: Standing   Other Standing Lumbar Exercises straight arm pull down 25lb 2x10, AR press 20lb x 10 each      Lumbar Exercises: Seated   Sit to Stand 5 reps   x3 from blue chair      Lumbar Exercises: Supine   Bridge Compliant;10 reps;2 seconds    Other Supine Lumbar Exercises LE on [place] bridges, K2c, Oblq      Modalities   Modalities Electrical Stimulation;Moist Heat      Moist Heat Therapy   Number Minutes Moist Heat 12 Minutes    Moist Heat Location Lumbar Spine      Electrical Stimulation   Electrical Stimulation Location Lumbar    Electrical Stimulation Action IFC    Electrical Stimulation Parameters supine    Electrical Stimulation Goals Pain                    PT Short Term Goals - 01/08/20 1336      PT SHORT TERM GOAL #1  Title independent with initial HEP    Time 2    Period Weeks    Status New             PT Long Term Goals - 01/08/20 1337      PT LONG TERM GOAL #1   Title understand posture and body mechanics    Time 8    Period Weeks    Status New      PT LONG TERM GOAL #2   Title decrease pain 50%    Time 8    Period Weeks    Status New      PT LONG TERM GOAL #3   Title increase lumbar extension 25% without pain    Time 8    Period Weeks    Status New      PT LONG TERM GOAL #4   Title demonstrate proper sitting posture for 10 minutes    Time 8    Period Weeks    Status New                 Plan - 01/15/20 1055    Clinical Impression Statement Patient tolerated an initial progression to TE well. Cues needed for core activation throughout session. Cues needed to keep shoulders back with seated rows. Some weakness present with anti rotational presses. No reports of pina with lumbar extensions.    Stability/Clinical Decision Making Stable/Uncomplicated    PT Next Visit Plan Core strength           Patient will benefit from skilled therapeutic intervention in order  to improve the following deficits and impairments:  Decreased range of motion, Difficulty walking, Increased muscle spasms, Pain, Improper body mechanics, Impaired flexibility, Decreased strength, Postural dysfunction  Visit Diagnosis: Pain in right leg  Pain in left leg  Muscle spasm of back  Acute bilateral low back pain without sciatica     Problem List Patient Active Problem List   Diagnosis Date Noted  . Varicose veins of both lower extremities with pain 02/02/2019  . Encounter for therapeutic drug level monitoring 09/19/2018  . Type 2 diabetes mellitus with diabetic polyneuropathy, without long-term current use of insulin ([place]) 03/29/2018  . Diabetic neuropathy ([place]) 03/29/2018  . CAD (coronary artery disease) 08/03/2017  . Post herpetic neuralgia 07/29/2016  . Dizziness 01/02/2016  . Chronic midline low back pain without sciatica 06/06/2015  . GERD (gastroesophageal reflux disease) 11/15/2014  . Seizure disorder ([place]) 05/07/2014  . Allergic rhinitis 05/07/2014  . Routine general medical examination at a health care facility 05/07/2014    Scot Jun, PTA 01/15/2020, 10:57 AM  [place], [place], [address] Phone: [phone]   Fax:  [phone]  Name: Nicholas Rasmussen MRN: [id number] Date of Birth: 03/31/1946

## 2020-01-17 ENCOUNTER — Ambulatory Visit: Payer: PPO | Admitting: Physical Therapy

## 2020-01-17 ENCOUNTER — Other Ambulatory Visit: Payer: Self-pay | Admitting: Interventional Cardiology

## 2020-01-17 ENCOUNTER — Other Ambulatory Visit: Payer: Self-pay

## 2020-01-17 ENCOUNTER — Encounter: Payer: Self-pay | Admitting: Physical Therapy

## 2020-01-17 DIAGNOSIS — M79605 Pain in left leg: Secondary | ICD-10-CM

## 2020-01-17 DIAGNOSIS — I25119 Atherosclerotic heart disease of native coronary artery with unspecified angina pectoris: Secondary | ICD-10-CM

## 2020-01-17 DIAGNOSIS — M79604 Pain in right leg: Secondary | ICD-10-CM

## 2020-01-17 DIAGNOSIS — M62830 Muscle spasm of back: Secondary | ICD-10-CM

## 2020-01-17 DIAGNOSIS — M545 Low back pain, unspecified: Secondary | ICD-10-CM

## 2020-01-17 NOTE — Therapy (Signed)
[place], [place], [address] Phone: [phone]   Fax:  [phone]  Physical Therapy Treatment  Patient Details  Name: Nicholas Rasmussen MRN: [id number] Date of Birth: 08/28/1945 Referring Provider (PT): Nche   Encounter Date: 01/17/2020   PT End of Session - 01/17/20 1430    Visit Number 3    Date for PT Re-Evaluation 03/09/20    Authorization Type [employer]    PT Start Time 1335    PT Stop Time 1430    PT Time Calculation (min) 55 min    Activity Tolerance Patient tolerated treatment well    Behavior During Therapy [facility] for tasks assessed/performed           Past Medical History:  Diagnosis Date  . Allergic rhinitis 05/07/2014  . Dizziness 01/02/2016  . GERD (gastroesophageal reflux disease)   . Headache 03/13/2015  . Left flank pain 01/02/2016  . Low back pain 06/06/2015  . Pain of left side of body 08/13/2015  . Post herpetic neuralgia   . Routine general medical examination at a health care facility 05/07/2014  . Seizure disorder ([place]) 05/07/2014  . Seizures ([place])    Last seizure in the 70s.  . Shingles   . Stye 05/07/2014    Past Surgical History:  Procedure Laterality Date  . COLONOSCOPY    . KNEE SURGERY Left    left miniscus  . UPPER GASTROINTESTINAL ENDOSCOPY    . uvala surgery      There were no vitals filed for this visit.   Subjective Assessment - 01/17/20 1346    Subjective pretty good after last session. Pretty good today    Currently in Pain? No/denies                             [facility] Adult PT Treatment/Exercise - 01/17/20 0001      Lumbar Exercises: Stretches   Passive Hamstring Stretch Right;Left;3 reps;20 seconds    Single Knee to Chest Stretch Right;Left;3 reps;20 seconds    Piriformis Stretch Right;Left;2 reps;20 seconds      Lumbar Exercises: Aerobic   Nustep L5 x 6 min       Lumbar Exercises: Machines for  Strengthening   Cybex Knee Extension 10lb 2x15     Cybex Knee Flexion 35lb 2x15    Other Lumbar Machine Exercise Rows & Lats 35lb 2x10      Lumbar Exercises: Standing   Other Standing Lumbar Exercises straight arm pull down 25lb 2x10, AR press 20lb x 10 each    Other Standing Lumbar Exercises Standing overhead Exy yellow 2x10       Lumbar Exercises: Seated   Sit to Stand 10 reps   x2, yellow ball chest press      Lumbar Exercises: Supine   Other Supine Lumbar Exercises LE on Pball bridges, K2c, Oblq                    PT Short Term Goals - 01/17/20 1440      PT SHORT TERM GOAL #1   Title independent with initial HEP    Status Achieved             PT Long Term Goals - 01/08/20 1337      PT LONG TERM GOAL #1   Title understand posture and body mechanics    Time 8    Period Weeks  Status New      PT LONG TERM GOAL #2   Title decrease pain 50%    Time 8    Period Weeks    Status New      PT LONG TERM GOAL #3   Title increase lumbar extension 25% without pain    Time 8    Period Weeks    Status New      PT LONG TERM GOAL #4   Title demonstrate proper sitting posture for 10 minutes    Time 8    Period Weeks    Status New                 Plan - 01/17/20 1434    Clinical Impression Statement Pt did well with a progressed therapy session. Tactile cue sot low back given to emphases lumbar ext with overhead extension. Some LE fatigue noted with seated curls and extensions. Core weakness noted with supine exercises with LE on pball. Tightness noted in both piriformis muscles.    Stability/Clinical Decision Making Stable/Uncomplicated    PT Next Visit Plan Core strength           Patient will benefit from skilled therapeutic intervention in order to improve the following deficits and impairments:  Decreased range of motion, Difficulty walking, Increased muscle spasms, Pain, Improper body mechanics, Impaired flexibility, Decreased strength,  Postural dysfunction  Visit Diagnosis: Pain in right leg  Pain in left leg  Muscle spasm of back  Acute bilateral low back pain without sciatica     Problem List Patient Active Problem List   Diagnosis Date Noted  . Varicose veins of both lower extremities with pain 02/02/2019  . Encounter for therapeutic drug level monitoring 09/19/2018  . Type 2 diabetes mellitus with diabetic polyneuropathy, without long-term current use of insulin ([place]) 03/29/2018  . Diabetic neuropathy ([place]) 03/29/2018  . CAD (coronary artery disease) 08/03/2017  . Post herpetic neuralgia 07/29/2016  . Dizziness 01/02/2016  . Chronic midline low back pain without sciatica 06/06/2015  . GERD (gastroesophageal reflux disease) 11/15/2014  . Seizure disorder ([place]) 05/07/2014  . Allergic rhinitis 05/07/2014  . Routine general medical examination at a health care facility 05/07/2014    Scot Jun, PTA 01/17/2020, 2:41 PM  [place], [place], [address] Phone: [phone]   Fax:  [phone]  Name: Nicholas Rasmussen MRN: [id number] Date of Birth: 04/01/1946

## 2020-01-18 ENCOUNTER — Telehealth: Payer: Self-pay | Admitting: Nurse Practitioner

## 2020-01-18 NOTE — Telephone Encounter (Signed)
Spoke with patient and scheduled patient a virtual appointment on 01/21/2020.

## 2020-01-18 NOTE — Telephone Encounter (Signed)
Patient is calling and requesting a refill for Zovirax sent to [facility] on [address], please advise. CB is [phone]

## 2020-01-21 ENCOUNTER — Encounter: Payer: Self-pay | Admitting: Nurse Practitioner

## 2020-01-21 ENCOUNTER — Other Ambulatory Visit: Payer: Self-pay

## 2020-01-21 ENCOUNTER — Other Ambulatory Visit (INDEPENDENT_AMBULATORY_CARE_PROVIDER_SITE_OTHER): Payer: PPO

## 2020-01-21 ENCOUNTER — Telehealth (INDEPENDENT_AMBULATORY_CARE_PROVIDER_SITE_OTHER): Payer: PPO | Admitting: Nurse Practitioner

## 2020-01-21 VITALS — BP 134/72 | HR 62 | Temp 97.5°F | Ht 75.5 in | Wt 242.0 lb

## 2020-01-21 DIAGNOSIS — R21 Rash and other nonspecific skin eruption: Secondary | ICD-10-CM

## 2020-01-21 MED ORDER — ACYCLOVIR 5 % EX CREA: 1.0000 "application " | TOPICAL_CREAM | Freq: Four times a day (QID) | CUTANEOUS | 3 refills | Status: DC | PRN

## 2020-01-21 NOTE — Progress Notes (Signed)
Virtual Visit via Video Note  I connected with@ on 01/21/20 at 11:30 AM EDT by a video enabled telemedicine application and verified that I am speaking with the correct person using two identifiers.  Location: Patient:Home Provider: Office Participants: patient and provider   I discussed the limitations of evaluation and management by telemedicine and the availability of in person appointments. I also discussed with the patient that there may be a patient responsible charge related to this service. The patient expressed understanding and agreed to proceed.  CC:Pt c/o rash x3 days and is in need of a refill on medication to help with this. Rash located on his private area and states it is red and white.   History of Present Illness: Rash This is a recurrent problem. The current episode started more than 1 year ago. The problem has been waxing and waning since onset. The affected locations include the genitalia. The rash is characterized by redness. It is unknown if there was an exposure to a precipitant. Pertinent negatives include no fatigue or fever. (No swollen lymph nodes No dysuria or urinary frequency) Treatments tried: zovirax cream. The treatment provided significant relief. His past medical history is significant for varicella.   Observations/Objective: Unable to eval rash due to video appt and location of rash (on penis)  Assessment and Plan: Nicholas Rasmussen was seen today for follow-up.  Diagnoses and all orders for this visit:  Penile rash -     acyclovir cream (ZOVIRAX) 5 %; Apply 1 application topically every 6 (six) hours as needed. -     RPR   Follow Up Instructions: Use zovirax cream Go to lab for RPR test   I discussed the assessment and treatment plan with the patient. The patient was provided an opportunity to ask questions and all were answered. The patient agreed with the plan and demonstrated an understanding of the instructions.   The patient was advised to call  back or seek an in-person evaluation if the symptoms worsen or if the condition fails to improve as anticipated.  [name], NP

## 2020-01-22 ENCOUNTER — Encounter: Payer: Self-pay | Admitting: Physical Therapy

## 2020-01-22 ENCOUNTER — Ambulatory Visit: Payer: PPO | Admitting: Physical Therapy

## 2020-01-22 DIAGNOSIS — M79604 Pain in right leg: Secondary | ICD-10-CM

## 2020-01-22 DIAGNOSIS — M545 Low back pain, unspecified: Secondary | ICD-10-CM

## 2020-01-22 DIAGNOSIS — M79605 Pain in left leg: Secondary | ICD-10-CM

## 2020-01-22 DIAGNOSIS — M62830 Muscle spasm of back: Secondary | ICD-10-CM

## 2020-01-22 LAB — RPR: RPR Ser Ql: NONREACTIVE

## 2020-01-22 NOTE — Therapy (Signed)
[place], [place], [address] Phone: [phone]   Fax:  [phone]  Physical Therapy Treatment  Patient Details  Name: Nicholas Rasmussen MRN: [id number] Date of Birth: 09-19-1945 Referring Provider (PT): Nche   Encounter Date: 01/22/2020   PT End of Session - 01/22/20 1225    Visit Number 4    Date for PT Re-Evaluation 03/09/20    Authorization Type [employer]    PT Start Time 1145    PT Stop Time 1238    PT Time Calculation (min) 53 min    Activity Tolerance Patient tolerated treatment well    Behavior During Therapy [name] for tasks assessed/performed           Past Medical History:  Diagnosis Date  . Allergic rhinitis 05/07/2014  . Dizziness 01/02/2016  . GERD (gastroesophageal reflux disease)   . Headache 03/13/2015  . Left flank pain 01/02/2016  . Low back pain 06/06/2015  . Pain of left side of body 08/13/2015  . Post herpetic neuralgia   . Routine general medical examination at a health care facility 05/07/2014  . Seizure disorder ([place]) 05/07/2014  . Seizures ([place])    Last seizure in the 70s.  . Shingles   . Stye 05/07/2014    Past Surgical History:  Procedure Laterality Date  . COLONOSCOPY    . KNEE SURGERY Left    left miniscus  . UPPER GASTROINTESTINAL ENDOSCOPY    . uvala surgery      There were no vitals filed for this visit.   Subjective Assessment - 01/22/20 1146    Subjective "Pretty good" Has a little dull sensation in low back,    Currently in Pain? No/denies    Pain Location Back    Pain Orientation Lower    Pain Descriptors / Indicators Dull                             [facility] Adult PT Treatment/Exercise - 01/22/20 0001      Lumbar Exercises: Aerobic   UBE (Upper Arm Bike) L4.5 x2 min each     Recumbent Bike L1 x 5 min       Lumbar Exercises: Machines for Strengthening   Cybex Knee Extension 10lb 2x15     Cybex Knee Flexion  35lb 2x15    Other Lumbar Machine Exercise Rows & Lats 35lb 2x10      Lumbar Exercises: Standing   Shoulder Extension Strengthening;Both;20 reps    Shoulder Extension Limitations 15    Other Standing Lumbar Exercises AR press 20lb x 10 each      Lumbar Exercises: Seated   Sit to Stand 20 reps   OHP with yellow ball      Modalities   Modalities Electrical Stimulation;Moist Heat      Moist Heat Therapy   Number Minutes Moist Heat 12 Minutes    Moist Heat Location Lumbar Spine      Electrical Stimulation   Electrical Stimulation Location Lumbar    Electrical Stimulation Action IFC    Electrical Stimulation Parameters supine    Electrical Stimulation Goals Pain                    PT Short Term Goals - 01/17/20 1440      PT SHORT TERM GOAL #1   Title independent with initial HEP    Status Achieved  PT Long Term Goals - 01/22/20 1230      PT LONG TERM GOAL #1   Title understand posture and body mechanics    Status Achieved      PT LONG TERM GOAL #2   Title decrease pain 50%    Status Partially Met      PT LONG TERM GOAL #3   Title increase lumbar extension 25% without pain    Status On-going      PT LONG TERM GOAL #4   Title demonstrate proper sitting posture for 10 minutes    Status Partially Met                 Plan - 01/22/20 1227    Clinical Impression Statement No pain reported today, on a dull sensation in the low back postural cues needed with standing shoulder extensions. Cues needed to squeeze shoulders blades together with seated rows. Some fatigue noted with sit to stands with OHP.    Stability/Clinical Decision Making Stable/Uncomplicated    PT Next Visit Plan Core strength           Patient will benefit from skilled therapeutic intervention in order to improve the following deficits and impairments:  Decreased range of motion, Difficulty walking, Increased muscle spasms, Pain, Improper body mechanics, Impaired  flexibility, Decreased strength, Postural dysfunction  Visit Diagnosis: Pain in right leg  Muscle spasm of back  Acute bilateral low back pain without sciatica  Pain in left leg     Problem List Patient Active Problem List   Diagnosis Date Noted  . Varicose veins of both lower extremities with pain 02/02/2019  . Encounter for therapeutic drug level monitoring 09/19/2018  . Type 2 diabetes mellitus with diabetic polyneuropathy, without long-term current use of insulin ([place]) 03/29/2018  . Diabetic neuropathy ([place]) 03/29/2018  . CAD (coronary artery disease) 08/03/2017  . Post herpetic neuralgia 07/29/2016  . Dizziness 01/02/2016  . Chronic midline low back pain without sciatica 06/06/2015  . GERD (gastroesophageal reflux disease) 11/15/2014  . Seizure disorder ([place]) 05/07/2014  . Allergic rhinitis 05/07/2014  . Routine general medical examination at a health care facility 05/07/2014    Scot Jun, PTA 01/22/2020, 12:31 PM  [place], [place], [address] Phone: [phone]   Fax:  [phone]  Name: Nicholas Rasmussen MRN: [id number] Date of Birth: 14-Jun-1945

## 2020-01-23 ENCOUNTER — Other Ambulatory Visit: Payer: Self-pay

## 2020-01-23 ENCOUNTER — Ambulatory Visit: Payer: PPO | Attending: Nurse Practitioner | Admitting: Physical Therapy

## 2020-01-23 ENCOUNTER — Encounter: Payer: Self-pay | Admitting: Physical Therapy

## 2020-01-23 DIAGNOSIS — M79605 Pain in left leg: Secondary | ICD-10-CM | POA: Diagnosis not present

## 2020-01-23 DIAGNOSIS — M62830 Muscle spasm of back: Secondary | ICD-10-CM | POA: Diagnosis not present

## 2020-01-23 DIAGNOSIS — M545 Low back pain, unspecified: Secondary | ICD-10-CM

## 2020-01-23 DIAGNOSIS — M79604 Pain in right leg: Secondary | ICD-10-CM | POA: Insufficient documentation

## 2020-01-23 NOTE — Therapy (Signed)
[place], [place], [address] Phone: [phone]   Fax:  [phone]  Physical Therapy Treatment  Patient Details  Name: Nicholas Rasmussen MRN: [id number] Date of Birth: 17-Aug-1945 Referring Provider (PT): Nche   Encounter Date: 01/23/2020   PT End of Session - 01/23/20 0925    Visit Number 5    Date for PT Re-Evaluation 03/09/20    PT Start Time 0845    PT Stop Time 0937    PT Time Calculation (min) 52 min    Activity Tolerance Patient tolerated treatment well    Behavior During Therapy [facility] for tasks assessed/performed           Past Medical History:  Diagnosis Date  . Allergic rhinitis 05/07/2014  . Dizziness 01/02/2016  . GERD (gastroesophageal reflux disease)   . Headache 03/13/2015  . Left flank pain 01/02/2016  . Low back pain 06/06/2015  . Pain of left side of body 08/13/2015  . Post herpetic neuralgia   . Routine general medical examination at a health care facility 05/07/2014  . Seizure disorder ([place]) 05/07/2014  . Seizures ([place])    Last seizure in the 70s.  . Shingles   . Stye 05/07/2014    Past Surgical History:  Procedure Laterality Date  . COLONOSCOPY    . KNEE SURGERY Left    left miniscus  . UPPER GASTROINTESTINAL ENDOSCOPY    . uvala surgery      There were no vitals filed for this visit.   Subjective Assessment - 01/23/20 0852    Subjective "Doing all right, pretty sleepy"    Currently in Pain? Yes    Pain Score 3     Pain Location Back                             [facility] Adult PT Treatment/Exercise - 01/23/20 0001      Lumbar Exercises: Aerobic   Nustep L6 x 6 min       Lumbar Exercises: Machines for Strengthening   Cybex Lumbar Extension black band 2x10     Other Lumbar Machine Exercise Rows & Lats 45lb 2x10      Lumbar Exercises: Standing   Shoulder Extension Strengthening;Both;20 reps    Shoulder Extension Limitations 15    Other  Standing Lumbar Exercises Hip Ext 10lx 2x10 each     Other Standing Lumbar Exercises Standing overhead Exy yellow 2x10       Lumbar Exercises: Supine   Other Supine Lumbar Exercises LE on Pball bridges, K2c, Oblq                    PT Short Term Goals - 01/17/20 1440      PT SHORT TERM GOAL #1   Title independent with initial HEP    Status Achieved             PT Long Term Goals - 01/22/20 1230      PT LONG TERM GOAL #1   Title understand posture and body mechanics    Status Achieved      PT LONG TERM GOAL #2   Title decrease pain 50%    Status Partially Met      PT LONG TERM GOAL #3   Title increase lumbar extension 25% without pain    Status On-going      PT LONG TERM GOAL #4   Title  demonstrate proper sitting posture for 10 minutes    Status Partially Met                 Plan - 01/23/20 0926    Clinical Impression Statement Pt did well with a progression in TE. Postural cues needed with seated rows to keep shoulders retracted. Tactile cues for posture needed with hip extensions and shoulder extensions. tactile cues given to low back to with overhead extensions.   No reports of pain, but did have some fatigue.    Stability/Clinical Decision Making Stable/Uncomplicated    PT Next Visit Plan Core strength           Patient will benefit from skilled therapeutic intervention in order to improve the following deficits and impairments:  Decreased range of motion, Difficulty walking, Increased muscle spasms, Pain, Improper body mechanics, Impaired flexibility, Decreased strength, Postural dysfunction  Visit Diagnosis: Muscle spasm of back  Acute bilateral low back pain without sciatica  Pain in left leg  Pain in right leg     Problem List Patient Active Problem List   Diagnosis Date Noted  . Varicose veins of both lower extremities with pain 02/02/2019  . Encounter for therapeutic drug level monitoring 09/19/2018  . Type 2 diabetes  mellitus with diabetic polyneuropathy, without long-term current use of insulin ([place]) 03/29/2018  . Diabetic neuropathy ([place]) 03/29/2018  . CAD (coronary artery disease) 08/03/2017  . Post herpetic neuralgia 07/29/2016  . Dizziness 01/02/2016  . Chronic midline low back pain without sciatica 06/06/2015  . GERD (gastroesophageal reflux disease) 11/15/2014  . Seizure disorder ([place]) 05/07/2014  . Allergic rhinitis 05/07/2014  . Routine general medical examination at a health care facility 05/07/2014    Scot Jun, PTA 01/23/2020, 9:28 AM  [place], [place], [address] Phone: [phone]   Fax:  [phone]  Name: Nicholas Rasmussen MRN: [id number] Date of Birth: 05-09-1946

## 2020-01-24 ENCOUNTER — Ambulatory Visit: Payer: PPO | Admitting: Physical Therapy

## 2020-01-30 ENCOUNTER — Ambulatory Visit: Payer: PPO | Admitting: Physical Therapy

## 2020-01-30 ENCOUNTER — Other Ambulatory Visit: Payer: Self-pay

## 2020-01-30 ENCOUNTER — Encounter: Payer: Self-pay | Admitting: Physical Therapy

## 2020-01-30 DIAGNOSIS — M62830 Muscle spasm of back: Secondary | ICD-10-CM | POA: Diagnosis not present

## 2020-01-30 DIAGNOSIS — M79605 Pain in left leg: Secondary | ICD-10-CM

## 2020-01-30 DIAGNOSIS — M545 Low back pain, unspecified: Secondary | ICD-10-CM

## 2020-01-30 DIAGNOSIS — M79604 Pain in right leg: Secondary | ICD-10-CM

## 2020-01-30 NOTE — Therapy (Signed)
[place], [place], [address] Phone: [phone]   Fax:  [phone]  Physical Therapy Treatment  Patient Details  Name: Nicholas Rasmussen MRN: [id number] Date of Birth: 1946/02/18 Referring Provider (PT): Nche   Encounter Date: 01/30/2020   PT End of Session - 01/30/20 0923    Visit Number 6    Date for PT Re-Evaluation 03/09/20    Authorization Type [employer]    PT Start Time 0845    PT Stop Time 0930    PT Time Calculation (min) 45 min    Activity Tolerance Patient tolerated treatment well    Behavior During Therapy [facility] for tasks assessed/performed           Past Medical History:  Diagnosis Date  . Allergic rhinitis 05/07/2014  . Dizziness 01/02/2016  . GERD (gastroesophageal reflux disease)   . Headache 03/13/2015  . Left flank pain 01/02/2016  . Low back pain 06/06/2015  . Pain of left side of body 08/13/2015  . Post herpetic neuralgia   . Routine general medical examination at a health care facility 05/07/2014  . Seizure disorder ([place]) 05/07/2014  . Seizures ([place])    Last seizure in the 70s.  . Shingles   . Stye 05/07/2014    Past Surgical History:  Procedure Laterality Date  . COLONOSCOPY    . KNEE SURGERY Left    left miniscus  . UPPER GASTROINTESTINAL ENDOSCOPY    . uvala surgery      There were no vitals filed for this visit.   Subjective Assessment - 01/30/20 0849    Subjective I did a long drive yesterday 7.5 hours, I am stiff and feel a little pain in the low back    Currently in Pain? Yes    Pain Score 3     Pain Location Back    Pain Orientation Lower    Pain Descriptors / Indicators Aching;Sore;Tightness    Aggravating Factors  Sitting and driving                             [facility] Adult PT Treatment/Exercise - 01/30/20 0001      Lumbar Exercises: Stretches   Passive Hamstring Stretch Right;Left;3 reps;20 seconds    Piriformis  Stretch Right;Left;3 reps;20 seconds      Lumbar Exercises: Aerobic   Nustep L6 x 6 min       Lumbar Exercises: Machines for Strengthening   Cybex Knee Extension 10lb 2x15     Cybex Knee Flexion 35lb 2x15    Leg Press 40# 2x15    Other Lumbar Machine Exercise straight arm pulls cues for posture and core activation 10# 2x15    Other Lumbar Machine Exercise Rows & Lats 45lb 2x10                    PT Short Term Goals - 01/17/20 1440      PT SHORT TERM GOAL #1   Title independent with initial HEP    Status Achieved             PT Long Term Goals - 01/30/20 0925      PT LONG TERM GOAL #2   Title decrease pain 50%    Status Partially Met      PT LONG TERM GOAL #3   Title increase lumbar extension 25% without pain    Status Partially Met  Plan - 01/30/20 0923    Clinical Impression Statement Added straight arm pulls, and leg press.  no issues with this, had some questions about stretching as he reports having some leg cramps.  Does need some cues to engage the core and does have diffiulty with stretching on his own    PT Next Visit Plan continue to progress exercises as he would like to be able to go to a gym in the future, I asked him to take some pix of his gym    Consulted and Agree with Plan of Care Patient           Patient will benefit from skilled therapeutic intervention in order to improve the following deficits and impairments:  Decreased range of motion, Difficulty walking, Increased muscle spasms, Pain, Improper body mechanics, Impaired flexibility, Decreased strength, Postural dysfunction  Visit Diagnosis: Muscle spasm of back  Acute bilateral low back pain without sciatica  Pain in left leg  Pain in right leg     Problem List Patient Active Problem List   Diagnosis Date Noted  . Varicose veins of both lower extremities with pain 02/02/2019  . Encounter for therapeutic drug level monitoring 09/19/2018  . Type 2  diabetes mellitus with diabetic polyneuropathy, without long-term current use of insulin ([place]) 03/29/2018  . Diabetic neuropathy ([place]) 03/29/2018  . CAD (coronary artery disease) 08/03/2017  . Post herpetic neuralgia 07/29/2016  . Dizziness 01/02/2016  . Chronic midline low back pain without sciatica 06/06/2015  . GERD (gastroesophageal reflux disease) 11/15/2014  . Seizure disorder ([place]) 05/07/2014  . Allergic rhinitis 05/07/2014  . Routine general medical examination at a health care facility 05/07/2014    Sumner Boast., PT 01/30/2020, 9:25 AM  [place], [place], [address] Phone: [phone]   Fax:  [phone]  Name: Linard Daft MRN: [id number] Date of Birth: 1946-05-10

## 2020-02-01 ENCOUNTER — Encounter: Payer: PPO | Attending: Nurse Practitioner | Admitting: Dietician

## 2020-02-01 ENCOUNTER — Other Ambulatory Visit: Payer: Self-pay

## 2020-02-01 DIAGNOSIS — E1142 Type 2 diabetes mellitus with diabetic polyneuropathy: Secondary | ICD-10-CM | POA: Insufficient documentation

## 2020-02-01 NOTE — Progress Notes (Signed)
Diabetes Self-Management Education  Visit Type: First/Initial  02/04/2020  [name], identified by name and date of birth, is a 74 y.o. male with a diagnosis of Diabetes: Type 2.   ASSESSMENT  Weight 244 lb (110.7 kg). Body mass index is 30.1 kg/m.   Patient was present for today's visit alone. Moved back to [place] from [place] in 2015 and helps take care of his brother. Remains physically active by going to physical therapy and ballroom dancing 3 days/week. States he likes sweets and will usually have something every day such as cake, cookies, [place] candy, or mini Snickers. Typical meal pattern is 3 meals and a snack per day.    Diabetes Self-Management Education - 02/01/20 1005      Visit Information   Visit Type First/Initial      Initial Visit   Diabetes Type Type 2    Are you currently following a meal plan? No    Are you taking your medications as prescribed? Yes    Date Diagnosed 2017      Health Coping   How would you rate your overall health? Good      Psychosocial Assessment   Patient Belief/Attitude about Diabetes Motivated to manage diabetes    Self-care barriers None    Self-management support Doctor's office    Patient Concerns Nutrition/Meal planning    Special Needs None    Preferred Learning Style No preference indicated    Learning Readiness Ready      Complications   Last HgB A1C per patient/outside source 6.2 %   10/08/19     Dietary Intake   Breakfast cereal   or oatmeal; or grits + eggs; or Ritz crackers   Lunch [employer]   or [place] sandwich w/ mayo on wheat   Snack (afternoon) watermelon   or ginger snaps; or cinnamon toast; or grapes   Dinner [employer]   or hamburger on the grill; or salmon; or fried fish; or sausage dog with fries   Beverage(s) V8 pomegranate blueberry juice, white cran juice, beer, mixed drinks, sweet tea, water      Exercise   Exercise Type Moderate (swimming / aerobic walking)    How many days per  week to you exercise? 5    How many minutes per day do you exercise? 30    Total minutes per week of exercise 150      Patient Education   Previous Diabetes Education No    Disease state  Definition of diabetes, type 1 and 2, and the diagnosis of diabetes;Factors that contribute to the development of diabetes;Explored patient's options for treatment of their diabetes    Nutrition management  Role of diet in the treatment of diabetes and the relationship between the three main macronutrients and blood glucose level;Effects of alcohol on blood glucose and safety factors with consumption of alcohol.;Information on hints to eating out and maintain blood glucose control.    Physical activity and exercise  Role of exercise on diabetes management, blood pressure control and cardiac health.      Individualized Goals (developed by patient)   Nutrition General guidelines for healthy choices and portions discussed      Outcomes   Expected Outcomes Demonstrated interest in learning. Expect positive outcomes    Future DMSE 3-4 months           Individualized Plan for Diabetes Self-Management Training:  Learning Objective:  Patient will have a greater understanding of diabetes self-management. Patient education plan is  to attend individual and/or group sessions per assessed needs and concerns.  Expected Outcomes:  Demonstrated interest in learning. Expect positive outcomes  Education material provided: MyPlate, Meal Ideas, Snack Ideas  If problems or questions, patient to contact team via:  Phone and Email  Future DSME appointment: 3-4 months

## 2020-02-04 ENCOUNTER — Encounter: Payer: Self-pay | Admitting: Dietician

## 2020-02-05 ENCOUNTER — Other Ambulatory Visit: Payer: Self-pay

## 2020-02-05 ENCOUNTER — Encounter: Payer: Self-pay | Admitting: Physical Therapy

## 2020-02-05 ENCOUNTER — Ambulatory Visit: Payer: PPO | Admitting: Physical Therapy

## 2020-02-05 DIAGNOSIS — M79604 Pain in right leg: Secondary | ICD-10-CM

## 2020-02-05 DIAGNOSIS — M62830 Muscle spasm of back: Secondary | ICD-10-CM

## 2020-02-05 DIAGNOSIS — M545 Low back pain, unspecified: Secondary | ICD-10-CM

## 2020-02-05 DIAGNOSIS — M79605 Pain in left leg: Secondary | ICD-10-CM

## 2020-02-05 NOTE — Therapy (Signed)
[place], [place], [address] Phone: [phone]   Fax:  [phone]  Physical Therapy Treatment  Patient Details  Name: Nicholas Rasmussen MRN: [id number] Date of Birth: 10/07/1945 Referring Provider (PT): Nche   Encounter Date: 02/05/2020   PT End of Session - 02/05/20 1338    Visit Number 7    Date for PT Re-Evaluation 03/09/20    Authorization Type [employer]    PT Start Time 1300    PT Stop Time 1345    PT Time Calculation (min) 45 min    Activity Tolerance Patient tolerated treatment well    Behavior During Therapy [facility] for tasks assessed/performed           Past Medical History:  Diagnosis Date  . Allergic rhinitis 05/07/2014  . Dizziness 01/02/2016  . GERD (gastroesophageal reflux disease)   . Headache 03/13/2015  . Left flank pain 01/02/2016  . Low back pain 06/06/2015  . Pain of left side of body 08/13/2015  . Post herpetic neuralgia   . Routine general medical examination at a health care facility 05/07/2014  . Seizure disorder ([place]) 05/07/2014  . Seizures ([place])    Last seizure in the 70s.  . Shingles   . Stye 05/07/2014    Past Surgical History:  Procedure Laterality Date  . COLONOSCOPY    . KNEE SURGERY Left    left miniscus  . UPPER GASTROINTESTINAL ENDOSCOPY    . uvala surgery      There were no vitals filed for this visit.   Subjective Assessment - 02/05/20 1300    Subjective It feels ok, back did well on long road trip. Reports an annoyance in [place] since Saturday.    Currently in Pain? Yes    Pain Score 4     Pain Location Back                             [facility] Adult PT Treatment/Exercise - 02/05/20 0001      Lumbar Exercises: Aerobic   Nustep L6 x 6 min       Lumbar Exercises: Machines for Strengthening   Cybex Knee Extension 15lb 2x15     Cybex Knee Flexion 35lb 2x15    Leg Press 40# 2x15    Other Lumbar Machine  Exercise straight arm pulls cues for posture and core activation 35# 2x15    Other Lumbar Machine Exercise Rows & Lats 45lb 2x10      Lumbar Exercises: Standing   Other Standing Lumbar Exercises AR press 25b x 10 each, RDL 10lb 2x8      Modalities   Modalities Electrical Stimulation;Moist Heat      Moist Heat Therapy   Number Minutes Moist Heat 12 Minutes    Moist Heat Location Lumbar Spine      Electrical Stimulation   Electrical Stimulation Location Lumbar    Electrical Stimulation Action IFC    Electrical Stimulation Parameters supine    Electrical Stimulation Goals Pain                    PT Short Term Goals - 01/17/20 1440      PT SHORT TERM GOAL #1   Title independent with initial HEP    Status Achieved             PT Long Term Goals - 02/05/20 1339  PT LONG TERM GOAL #1   Title understand posture and body mechanics    Status Achieved      PT LONG TERM GOAL #2   Title decrease pain 50%    Status Partially Met      PT LONG TERM GOAL #3   Title increase lumbar extension 25% without pain                 Plan - 02/05/20 1339    Clinical Impression Statement Pt did well overall today, Increase resistance added with straight arm pull downs. Tactile cues needed to maintain posture. Cue needed for pacing with seated rows to slow down and focus on a good contraction. Some fatigue noted with supine hip bridges.    Stability/Clinical Decision Making Stable/Uncomplicated    PT Next Visit Plan continue to progress exercises as he would like to be able to go to a gym in the future (phone was in car), I asked him to take some pix of his gym           Patient will benefit from skilled therapeutic intervention in order to improve the following deficits and impairments:  Decreased range of motion, Difficulty walking, Increased muscle spasms, Pain, Improper body mechanics, Impaired flexibility, Decreased strength, Postural dysfunction  Visit  Diagnosis: Pain in left leg  Pain in right leg  Acute bilateral low back pain without sciatica  Muscle spasm of back     Problem List Patient Active Problem List   Diagnosis Date Noted  . Varicose veins of both lower extremities with pain 02/02/2019  . Encounter for therapeutic drug level monitoring 09/19/2018  . Type 2 diabetes mellitus with diabetic polyneuropathy, without long-term current use of insulin ([place]) 03/29/2018  . Diabetic neuropathy ([place]) 03/29/2018  . CAD (coronary artery disease) 08/03/2017  . Post herpetic neuralgia 07/29/2016  . Dizziness 01/02/2016  . Chronic midline low back pain without sciatica 06/06/2015  . GERD (gastroesophageal reflux disease) 11/15/2014  . Seizure disorder ([place]) 05/07/2014  . Allergic rhinitis 05/07/2014  . Routine general medical examination at a health care facility 05/07/2014    Scot Jun, PTA 02/05/2020, 1:43 PM  [place], [place], [address] Phone: [phone]   Fax:  [phone]  Name: Nicholas Rasmussen MRN: [id number] Date of Birth: Jan 26, 1946

## 2020-02-07 ENCOUNTER — Encounter: Payer: Self-pay | Admitting: Physical Therapy

## 2020-02-07 ENCOUNTER — Other Ambulatory Visit: Payer: Self-pay

## 2020-02-07 ENCOUNTER — Telehealth: Payer: Self-pay

## 2020-02-07 ENCOUNTER — Ambulatory Visit: Payer: PPO | Admitting: Physical Therapy

## 2020-02-07 DIAGNOSIS — M79604 Pain in right leg: Secondary | ICD-10-CM

## 2020-02-07 DIAGNOSIS — M62830 Muscle spasm of back: Secondary | ICD-10-CM | POA: Diagnosis not present

## 2020-02-07 DIAGNOSIS — M545 Low back pain, unspecified: Secondary | ICD-10-CM

## 2020-02-07 DIAGNOSIS — M79605 Pain in left leg: Secondary | ICD-10-CM

## 2020-02-07 NOTE — Therapy (Signed)
[place], [place], [address] Phone: [phone]   Fax:  [phone]  Physical Therapy Treatment  Patient Details  Name: Nicholas Rasmussen MRN: [id number] Date of Birth: 1945-08-28 Referring Provider (PT): Nche   Encounter Date: 02/07/2020   PT End of Session - 02/07/20 1341    Visit Number 8    Date for PT Re-Evaluation 03/09/20    Authorization Type [employer]    PT Start Time 1300    PT Stop Time 1353    PT Time Calculation (min) 53 min    Activity Tolerance Patient tolerated treatment well    Behavior During Therapy [facility] for tasks assessed/performed           Past Medical History:  Diagnosis Date  . Allergic rhinitis 05/07/2014  . Dizziness 01/02/2016  . GERD (gastroesophageal reflux disease)   . Headache 03/13/2015  . Left flank pain 01/02/2016  . Low back pain 06/06/2015  . Pain of left side of body 08/13/2015  . Post herpetic neuralgia   . Routine general medical examination at a health care facility 05/07/2014  . Seizure disorder ([place]) 05/07/2014  . Seizures ([place])    Last seizure in the 70s.  . Shingles   . Stye 05/07/2014    Past Surgical History:  Procedure Laterality Date  . COLONOSCOPY    . KNEE SURGERY Left    left miniscus  . UPPER GASTROINTESTINAL ENDOSCOPY    . uvala surgery      There were no vitals filed for this visit.   Subjective Assessment - 02/07/20 1305    Subjective "Ill do"    Currently in Pain? Yes    Pain Score 3     Pain Location Back              [facility] PT Assessment - 02/07/20 0001      AROM   Overall AROM Comments Lumbar ROM WFL's                          [facility] Adult PT Treatment/Exercise - 02/07/20 0001      Lumbar Exercises: Aerobic   Elliptical I7 R5 x3 min     Nustep L5 x 6 min       Lumbar Exercises: Machines for Strengthening   Cybex Lumbar Extension black band 2x15    Cybex Knee Extension 20lb 3x10      Cybex Knee Flexion 45lb 3x10    Leg Press 60lb 3x10     Other Lumbar Machine Exercise Rows & Lats 45lb 2x10      Lumbar Exercises: Standing   Shoulder Extension Strengthening;Both;20 reps    Shoulder Extension Limitations 15    Other Standing Lumbar Exercises Standing overhead Ext yellow 2x10       Modalities   Modalities Electrical Stimulation;Moist Heat      Moist Heat Therapy   Number Minutes Moist Heat 12 Minutes    Moist Heat Location Lumbar Spine      Electrical Stimulation   Electrical Stimulation Location Lumbar    Electrical Stimulation Action IFC    Electrical Stimulation Parameters supine    Electrical Stimulation Goals Pain                    PT Short Term Goals - 01/17/20 1440      PT SHORT TERM GOAL #1   Title independent with initial HEP  Status Achieved             PT Long Term Goals - 02/07/20 1313      PT LONG TERM GOAL #3   Title increase lumbar extension 25% without pain    Status Achieved                 Plan - 02/07/20 1341    Clinical Impression Statement Pt has progressed towards goals increasing his lumbar ROM without pain. Postural cue needed with shoulder extensions. Tactile cues to low back to emphasize more lumbar ext with overhead reaches. LE fatigue noted with leg press.    Stability/Clinical Decision Making Stable/Uncomplicated    PT Next Visit Plan continue to progress exercises as he would like to be able to go to a gym in the future           Patient will benefit from skilled therapeutic intervention in order to improve the following deficits and impairments:  Decreased range of motion, Difficulty walking, Increased muscle spasms, Pain, Improper body mechanics, Impaired flexibility, Decreased strength, Postural dysfunction  Visit Diagnosis: Pain in right leg  Pain in left leg  Acute bilateral low back pain without sciatica     Problem List Patient Active Problem List   Diagnosis Date Noted  .  Varicose veins of both lower extremities with pain 02/02/2019  . Encounter for therapeutic drug level monitoring 09/19/2018  . Type 2 diabetes mellitus with diabetic polyneuropathy, without long-term current use of insulin ([place]) 03/29/2018  . Diabetic neuropathy ([place]) 03/29/2018  . CAD (coronary artery disease) 08/03/2017  . Post herpetic neuralgia 07/29/2016  . Dizziness 01/02/2016  . Chronic midline low back pain without sciatica 06/06/2015  . GERD (gastroesophageal reflux disease) 11/15/2014  . Seizure disorder ([place]) 05/07/2014  . Allergic rhinitis 05/07/2014  . Routine general medical examination at a health care facility 05/07/2014    Scot Jun, PTA 02/07/2020, 1:43 PM  [place], [place], [address] Phone: [phone]   Fax:  [phone]  Name: Nicholas Rasmussen MRN: [id number] Date of Birth: 02-26-1946

## 2020-02-07 NOTE — Progress Notes (Signed)
  I have attempted without success to contact this patient by phone three times to do his General Adherence call. I left a Voice message for patient to return my call.   [place] Pharmacist Assistant [phone]

## 2020-02-12 ENCOUNTER — Encounter: Payer: Self-pay | Admitting: Physical Therapy

## 2020-02-12 ENCOUNTER — Other Ambulatory Visit: Payer: Self-pay

## 2020-02-12 ENCOUNTER — Ambulatory Visit: Payer: PPO | Admitting: Physical Therapy

## 2020-02-12 DIAGNOSIS — M545 Low back pain, unspecified: Secondary | ICD-10-CM

## 2020-02-12 DIAGNOSIS — M62830 Muscle spasm of back: Secondary | ICD-10-CM

## 2020-02-12 DIAGNOSIS — M79604 Pain in right leg: Secondary | ICD-10-CM

## 2020-02-12 DIAGNOSIS — M79605 Pain in left leg: Secondary | ICD-10-CM

## 2020-02-12 NOTE — Therapy (Signed)
[place] [place], [place], [address] Phone: [phone]   Fax:  [phone]  Physical Therapy Treatment  Patient Details  Name: Nicholas Rasmussen MRN: [id number] Date of Birth: 02-20-1946 Referring Provider (PT): Nche   Encounter Date: 02/12/2020   PT End of Session - 02/12/20 1343    Visit Number 9    Date for PT Re-Evaluation 03/09/20    Authorization Type [employer]    PT Start Time 1300    PT Stop Time 1353    PT Time Calculation (min) 53 min    Activity Tolerance Patient tolerated treatment well    Behavior During Therapy [facility] for tasks assessed/performed           Past Medical History:  Diagnosis Date  . Allergic rhinitis 05/07/2014  . Dizziness 01/02/2016  . GERD (gastroesophageal reflux disease)   . Headache 03/13/2015  . Left flank pain 01/02/2016  . Low back pain 06/06/2015  . Pain of left side of body 08/13/2015  . Post herpetic neuralgia   . Routine general medical examination at a health care facility 05/07/2014  . Seizure disorder ([place]) 05/07/2014  . Seizures ([place])    Last seizure in the 70s.  . Shingles   . Stye 05/07/2014    Past Surgical History:  Procedure Laterality Date  . COLONOSCOPY    . KNEE SURGERY Left    left miniscus  . UPPER GASTROINTESTINAL ENDOSCOPY    . uvala surgery      There were no vitals filed for this visit.   Subjective Assessment - 02/12/20 1304    Subjective "Fait to partly cloudy today"    Currently in Pain? Yes    Pain Score 3     Pain Location Back                             [facility] Adult PT Treatment/Exercise - 02/12/20 0001      Lumbar Exercises: Aerobic   Elliptical I7 R5 x3 min     Nustep L6 x 6 min       Lumbar Exercises: Machines for Strengthening   Cybex Lumbar Extension black band 2x15    Leg Press 60lb 3x10     Other Lumbar Machine Exercise Rows & Lats 45lb 2x10      Lumbar Exercises: Standing   Other  Standing Lumbar Exercises Hip ext 2x10 10lb       Lumbar Exercises: Supine   Bridge Compliant;2 seconds;20 reps    Other Supine Lumbar Exercises LE on Pball bridges, K2c, Oblq    Other Supine Lumbar Exercises plank 4x 10 sec      Modalities   Modalities Electrical Stimulation;Moist Heat      Moist Heat Therapy   Number Minutes Moist Heat 12 Minutes    Moist Heat Location Lumbar Spine      Electrical Stimulation   Electrical Stimulation Location Lumbar    Electrical Stimulation Action IFC    Electrical Stimulation Parameters supine    Electrical Stimulation Goals Pain                    PT Short Term Goals - 01/17/20 1440      PT SHORT TERM GOAL #1   Title independent with initial HEP    Status Achieved             PT Long Term Goals - 02/07/20 1313  PT LONG TERM GOAL #3   Title increase lumbar extension 25% without pain    Status Achieved                 Plan - 02/12/20 1344    Clinical Impression Statement Pt did well today. Some low back tightness and hi weakness noted with hip extensions. Tactile cues for posture needed with seated rows. Visual torso shaking with prone planks.    Stability/Clinical Decision Making Stable/Uncomplicated    PT Next Visit Plan continue to progress exercises as he would like to be able to go to a gym in the future           Patient will benefit from skilled therapeutic intervention in order to improve the following deficits and impairments:  Decreased range of motion, Difficulty walking, Increased muscle spasms, Pain, Improper body mechanics, Impaired flexibility, Decreased strength, Postural dysfunction  Visit Diagnosis: Acute bilateral low back pain without sciatica  Muscle spasm of back  Pain in left leg  Pain in right leg     Problem List Patient Active Problem List   Diagnosis Date Noted  . Varicose veins of both lower extremities with pain 02/02/2019  . Encounter for therapeutic drug level  monitoring 09/19/2018  . Type 2 diabetes mellitus with diabetic polyneuropathy, without long-term current use of insulin ([place]) 03/29/2018  . Diabetic neuropathy ([place]) 03/29/2018  . CAD (coronary artery disease) 08/03/2017  . Post herpetic neuralgia 07/29/2016  . Dizziness 01/02/2016  . Chronic midline low back pain without sciatica 06/06/2015  . GERD (gastroesophageal reflux disease) 11/15/2014  . Seizure disorder ([place]) 05/07/2014  . Allergic rhinitis 05/07/2014  . Routine general medical examination at a health care facility 05/07/2014    Scot Jun 02/12/2020, 1:46 PM  [place] [place], [place], [address] Phone: [phone]   Fax:  [phone]  Name: Nicholas Rasmussen MRN: [id number] Date of Birth: 12/17/1945

## 2020-02-14 ENCOUNTER — Ambulatory Visit: Payer: PPO | Admitting: Physical Therapy

## 2020-02-14 ENCOUNTER — Encounter: Payer: Self-pay | Admitting: Physical Therapy

## 2020-02-14 ENCOUNTER — Other Ambulatory Visit: Payer: Self-pay

## 2020-02-14 DIAGNOSIS — M545 Low back pain, unspecified: Secondary | ICD-10-CM

## 2020-02-14 DIAGNOSIS — M79605 Pain in left leg: Secondary | ICD-10-CM

## 2020-02-14 DIAGNOSIS — M79604 Pain in right leg: Secondary | ICD-10-CM

## 2020-02-14 DIAGNOSIS — M62830 Muscle spasm of back: Secondary | ICD-10-CM | POA: Diagnosis not present

## 2020-02-14 NOTE — Therapy (Signed)
[place] [place], [place], [address] Phone: [phone]   Fax:  [phone]  Physical Therapy Treatment Progress Note Reporting Period 01/08/2020 to 02/14/2020  See note below for Objective Data and Assessment of Progress/Goals.      Patient Details  Name: Nicholas Rasmussen MRN: [id number] Date of Birth: Aug 04, 1945 Referring Provider (PT): Nche   Encounter Date: 02/14/2020   PT End of Session - 02/14/20 1343    Visit Number 10    Date for PT Re-Evaluation 03/09/20    Authorization Type [employer]    PT Start Time 1300    PT Stop Time 1355    PT Time Calculation (min) 55 min    Activity Tolerance Patient tolerated treatment well    Behavior During Therapy [facility] for tasks assessed/performed           Past Medical History:  Diagnosis Date  . Allergic rhinitis 05/07/2014  . Dizziness 01/02/2016  . GERD (gastroesophageal reflux disease)   . Headache 03/13/2015  . Left flank pain 01/02/2016  . Low back pain 06/06/2015  . Pain of left side of body 08/13/2015  . Post herpetic neuralgia   . Routine general medical examination at a health care facility 05/07/2014  . Seizure disorder ([place]) 05/07/2014  . Seizures ([place])    Last seizure in the 70s.  . Shingles   . Stye 05/07/2014    Past Surgical History:  Procedure Laterality Date  . COLONOSCOPY    . KNEE SURGERY Left    left miniscus  . UPPER GASTROINTESTINAL ENDOSCOPY    . uvala surgery      There were no vitals filed for this visit.   Subjective Assessment - 02/14/20 1304    Subjective "Fait to [place]"    Currently in Pain? Yes   "My usual, I know its there"   Pain Score 3     Pain Location Back                             [facility] Adult PT Treatment/Exercise - 02/14/20 0001      Lumbar Exercises: Aerobic   Elliptical I7 R5 x3 min     Nustep L6 x 6 min       Lumbar Exercises: Machines for Strengthening   Cybex Knee  Extension 20lb 3x10     Cybex Knee Flexion 45lb 3x10      Lumbar Exercises: Standing   Shoulder Extension Strengthening;Both;20 reps    Shoulder Extension Limitations 15    Other Standing Lumbar Exercises AR press 25b x 10 each, RDL 10lb 2x8      Lumbar Exercises: Supine   Dead Bug 5 reps;2 seconds   x2   Other Supine Lumbar Exercises LE on Pball bridges, K2c, Oblq    Other Supine Lumbar Exercises plank 3x 15sec      Modalities   Modalities Electrical Stimulation;Moist Heat      Moist Heat Therapy   Number Minutes Moist Heat 12 Minutes    Moist Heat Location Lumbar Spine      Electrical Stimulation   Electrical Stimulation Location Lumbar    Electrical Stimulation Action IFC    Electrical Stimulation Parameters supine     Electrical Stimulation Goals Pain                    PT Short Term Goals - 01/17/20 1440      PT  SHORT TERM GOAL #1   Title independent with initial HEP    Status Achieved             PT Long Term Goals - 02/14/20 1316      PT LONG TERM GOAL #1   Title understand posture and body mechanics    Status Achieved      PT LONG TERM GOAL #2   Title decrease pain 50%    Status Partially Met      PT LONG TERM GOAL #3   Title increase lumbar extension 25% without pain    Status Achieved      PT LONG TERM GOAL #4   Title demonstrate proper sitting posture for 10 minutes    Status Achieved                 Plan - 02/14/20 1344    Clinical Impression Statement Pt is doing well and reports improvement overall. Core weakness present with dead bugs, planks and anti rotational presses. Some LE fatigue with curls ands extensions.    Stability/Clinical Decision Making Stable/Uncomplicated    PT Next Visit Plan continue to progress exercises as he would like to be able to go to a gym in the future           Patient will benefit from skilled therapeutic intervention in order to improve the following deficits and impairments:  Decreased  range of motion, Difficulty walking, Increased muscle spasms, Pain, Improper body mechanics, Impaired flexibility, Decreased strength, Postural dysfunction  Visit Diagnosis: Pain in right leg  Pain in left leg  Muscle spasm of back  Acute bilateral low back pain without sciatica     Problem List Patient Active Problem List   Diagnosis Date Noted  . Varicose veins of both lower extremities with pain 02/02/2019  . Encounter for therapeutic drug level monitoring 09/19/2018  . Type 2 diabetes mellitus with diabetic polyneuropathy, without long-term current use of insulin ([place]) 03/29/2018  . Diabetic neuropathy ([place]) 03/29/2018  . CAD (coronary artery disease) 08/03/2017  . Post herpetic neuralgia 07/29/2016  . Dizziness 01/02/2016  . Chronic midline low back pain without sciatica 06/06/2015  . GERD (gastroesophageal reflux disease) 11/15/2014  . Seizure disorder ([place]) 05/07/2014  . Allergic rhinitis 05/07/2014  . Routine general medical examination at a health care facility 05/07/2014   Amador Cunas, PT, DPT Scot Jun, PTA 02/14/2020, 1:48 PM  [place]. [place], [place], [address] Phone: [phone]   Fax:  [phone]  Name: Nicholas Rasmussen MRN: [id number] Date of Birth: Mar 19, 1946

## 2020-02-19 ENCOUNTER — Other Ambulatory Visit: Payer: Self-pay

## 2020-02-19 ENCOUNTER — Ambulatory Visit: Payer: PPO

## 2020-02-19 DIAGNOSIS — M545 Low back pain, unspecified: Secondary | ICD-10-CM

## 2020-02-19 DIAGNOSIS — M62830 Muscle spasm of back: Secondary | ICD-10-CM

## 2020-02-19 DIAGNOSIS — M79605 Pain in left leg: Secondary | ICD-10-CM

## 2020-02-19 DIAGNOSIS — M79604 Pain in right leg: Secondary | ICD-10-CM

## 2020-02-19 NOTE — Therapy (Signed)
[place]. [place], [place], [address] Phone: [phone]   Fax:  [phone]  Physical Therapy Treatment  Patient Details  Name: Nicholas Rasmussen MRN: [id number] Date of Birth: 1946-03-15 Referring Provider (PT): Nche   Encounter Date: 02/19/2020   PT End of Session - 02/19/20 1450    Visit Number 11    Date for PT Re-Evaluation 03/09/20    Authorization Type [employer]    PT Start Time 1445    PT Stop Time 1530    PT Time Calculation (min) 45 min    Activity Tolerance Patient tolerated treatment well    Behavior During Therapy [facility] for tasks assessed/performed           Past Medical History:  Diagnosis Date  . Allergic rhinitis 05/07/2014  . Dizziness 01/02/2016  . GERD (gastroesophageal reflux disease)   . Headache 03/13/2015  . Left flank pain 01/02/2016  . Low back pain 06/06/2015  . Pain of left side of body 08/13/2015  . Post herpetic neuralgia   . Routine general medical examination at a health care facility 05/07/2014  . Seizure disorder ([place]) 05/07/2014  . Seizures ([place])    Last seizure in the 70s.  . Shingles   . Stye 05/07/2014    Past Surgical History:  Procedure Laterality Date  . COLONOSCOPY    . KNEE SURGERY Left    left miniscus  . UPPER GASTROINTESTINAL ENDOSCOPY    . uvala surgery      There were no vitals filed for this visit.   Subjective Assessment - 02/19/20 1448    Subjective pt reports he has been trying to get better at focusing on his posture.    Patient Stated Goals less pain, be stronger    Currently in Pain? Yes   the usual LBP   Pain Score 3     Pain Location Back    Pain Orientation Lower                             [facility] Adult PT Treatment/Exercise - 02/19/20 0001      Lumbar Exercises: Aerobic   Elliptical L7 R5 x 5 min    Nustep L6 x 5 min      Lumbar Exercises: Machines for Strengthening   Cybex Knee Extension 20lb 3x12      Cybex Knee Flexion 45lb 3x12      Lumbar Exercises: Standing   Shoulder Extension Strengthening;Both;20 reps    Shoulder Extension Limitations 15    Other Standing Lumbar Exercises AR press 25b x 12 each      Lumbar Exercises: Supine   Dead Bug 5 reps;2 seconds   x2   Other Supine Lumbar Exercises LE on Pball bridges, K2c    Other Supine Lumbar Exercises forearm plank on mat 3x15"                    PT Short Term Goals - 01/17/20 1440      PT SHORT TERM GOAL #1   Title independent with initial HEP    Status Achieved             PT Long Term Goals - 02/14/20 1316      PT LONG TERM GOAL #1   Title understand posture and body mechanics    Status Achieved      PT LONG TERM GOAL #2   Title decrease  pain 50%    Status Partially Met      PT LONG TERM GOAL #3   Title increase lumbar extension 25% without pain    Status Achieved      PT LONG TERM GOAL #4   Title demonstrate proper sitting posture for 10 minutes    Status Achieved                 Plan - 02/19/20 1450    Clinical Impression Statement Well tolerated tx with increase in reps to 12 for seated cybex knee ext/flex and standing AR presses. Pt continues to demo difficulty with posture and lower core strength.    Stability/Clinical Decision Making Stable/Uncomplicated    PT Next Visit Plan continue to progress exercises as he would like to be able to go to a gym in the future           Patient will benefit from skilled therapeutic intervention in order to improve the following deficits and impairments:  Decreased range of motion, Difficulty walking, Increased muscle spasms, Pain, Improper body mechanics, Impaired flexibility, Decreased strength, Postural dysfunction  Visit Diagnosis: Pain in right leg  Pain in left leg  Muscle spasm of back  Acute bilateral low back pain without sciatica     Problem List Patient Active Problem List   Diagnosis Date Noted  . Varicose veins of  both lower extremities with pain 02/02/2019  . Encounter for therapeutic drug level monitoring 09/19/2018  . Type 2 diabetes mellitus with diabetic polyneuropathy, without long-term current use of insulin ([place]) 03/29/2018  . Diabetic neuropathy ([place]) 03/29/2018  . CAD (coronary artery disease) 08/03/2017  . Post herpetic neuralgia 07/29/2016  . Dizziness 01/02/2016  . Chronic midline low back pain without sciatica 06/06/2015  . GERD (gastroesophageal reflux disease) 11/15/2014  . Seizure disorder ([place]) 05/07/2014  . Allergic rhinitis 05/07/2014  . Routine general medical examination at a health care facility 05/07/2014    Izell Kingsville, PT, DPT 02/19/2020, 3:32 PM  [place]. [place], [place], [address] Phone: [phone]   Fax:  [phone]  Name: Nicholas Rasmussen MRN: [id number] Date of Birth: 21-May-1946

## 2020-02-21 ENCOUNTER — Ambulatory Visit: Payer: PPO | Admitting: Physical Therapy

## 2020-02-21 ENCOUNTER — Encounter: Payer: Self-pay | Admitting: Physical Therapy

## 2020-02-21 ENCOUNTER — Other Ambulatory Visit: Payer: Self-pay

## 2020-02-21 DIAGNOSIS — M79605 Pain in left leg: Secondary | ICD-10-CM

## 2020-02-21 DIAGNOSIS — M62830 Muscle spasm of back: Secondary | ICD-10-CM

## 2020-02-21 DIAGNOSIS — M79604 Pain in right leg: Secondary | ICD-10-CM

## 2020-02-21 NOTE — Therapy (Signed)
[place]. [place], [place], [address] Phone: [phone]   Fax:  [phone]  Physical Therapy Treatment  Patient Details  Name: Nicholas Rasmussen MRN: [id number] Date of Birth: 03/12/1946 Referring Provider (PT): Nche   Encounter Date: 02/21/2020   PT End of Session - 02/21/20 1224    Visit Number 12    Date for PT Re-Evaluation 03/09/20    Authorization Type [employer]    PT Start Time 1145    PT Stop Time 1227    PT Time Calculation (min) 42 min    Activity Tolerance Patient tolerated treatment well    Behavior During Therapy [facility] for tasks assessed/performed           Past Medical History:  Diagnosis Date  . Allergic rhinitis 05/07/2014  . Dizziness 01/02/2016  . GERD (gastroesophageal reflux disease)   . Headache 03/13/2015  . Left flank pain 01/02/2016  . Low back pain 06/06/2015  . Pain of left side of body 08/13/2015  . Post herpetic neuralgia   . Routine general medical examination at a health care facility 05/07/2014  . Seizure disorder ([place]) 05/07/2014  . Seizures ([place])    Last seizure in the 70s.  . Shingles   . Stye 05/07/2014    Past Surgical History:  Procedure Laterality Date  . COLONOSCOPY    . KNEE SURGERY Left    left miniscus  . UPPER GASTROINTESTINAL ENDOSCOPY    . uvala surgery      There were no vitals filed for this visit.   Subjective Assessment - 02/21/20 1149    Subjective "Fair to partly cloudy"    Currently in Pain? Yes    Pain Score 3     Pain Location Back                             [facility] Adult PT Treatment/Exercise - 02/21/20 0001      Lumbar Exercises: Aerobic   Elliptical L13 R7 x 3 min each way    UBE (Upper Arm Bike) L5 x2 min each standing       Lumbar Exercises: Machines for Strengthening   Cybex Knee Extension 20lb 3x12    Cybex Knee Flexion 45lb 3x12    Other Lumbar Machine Exercise Rows & Lats 45lb 2x12       Lumbar Exercises: Standing   Shoulder Extension Strengthening;Both;20 reps    Shoulder Extension Limitations 15    Other Standing Lumbar Exercises AR press 25b x 12 each      Lumbar Exercises: Supine   Dead Bug 10 reps;2 seconds   x2   Other Supine Lumbar Exercises LE on Pball bridges, K2c    Other Supine Lumbar Exercises forearm plank on mat 3x15"                    PT Short Term Goals - 01/17/20 1440      PT SHORT TERM GOAL #1   Title independent with initial HEP    Status Achieved             PT Long Term Goals - 02/14/20 1316      PT LONG TERM GOAL #1   Title understand posture and body mechanics    Status Achieved      PT LONG TERM GOAL #2   Title decrease pain 50%    Status Partially Met  PT LONG TERM GOAL #3   Title increase lumbar extension 25% without pain    Status Achieved      PT LONG TERM GOAL #4   Title demonstrate proper sitting posture for 10 minutes    Status Achieved                 Plan - 02/21/20 1225    Clinical Impression Statement Some fatigue and core weakness present with dead bug exercises. Increase fatigue with supine interventions while LE on pball. Postural cues required with seated rows.    Stability/Clinical Decision Making Stable/Uncomplicated    PT Next Visit Plan continue to progress exercises as he would like to be able to go to a gym in the future           Patient will benefit from skilled therapeutic intervention in order to improve the following deficits and impairments:  Decreased range of motion, Difficulty walking, Increased muscle spasms, Pain, Improper body mechanics, Impaired flexibility, Decreased strength, Postural dysfunction  Visit Diagnosis: Pain in left leg  Pain in right leg  Muscle spasm of back     Problem List Patient Active Problem List   Diagnosis Date Noted  . Varicose veins of both lower extremities with pain 02/02/2019  . Encounter for therapeutic drug level monitoring  09/19/2018  . Type 2 diabetes mellitus with diabetic polyneuropathy, without long-term current use of insulin ([place]) 03/29/2018  . Diabetic neuropathy ([place]) 03/29/2018  . CAD (coronary artery disease) 08/03/2017  . Post herpetic neuralgia 07/29/2016  . Dizziness 01/02/2016  . Chronic midline low back pain without sciatica 06/06/2015  . GERD (gastroesophageal reflux disease) 11/15/2014  . Seizure disorder ([place]) 05/07/2014  . Allergic rhinitis 05/07/2014  . Routine general medical examination at a health care facility 05/07/2014    Scot Jun 02/21/2020, 12:27 PM  [place]. [place], [place], [address] Phone: [phone]   Fax:  [phone]  Name: Nicholas Rasmussen MRN: [id number] Date of Birth: 12-Jul-1945

## 2020-02-25 ENCOUNTER — Other Ambulatory Visit: Payer: Self-pay

## 2020-02-26 ENCOUNTER — Encounter: Payer: Self-pay | Admitting: Nurse Practitioner

## 2020-02-26 ENCOUNTER — Ambulatory Visit: Payer: PPO | Attending: Nurse Practitioner | Admitting: Physical Therapy

## 2020-02-26 ENCOUNTER — Encounter: Payer: Self-pay | Admitting: Physical Therapy

## 2020-02-26 ENCOUNTER — Ambulatory Visit (INDEPENDENT_AMBULATORY_CARE_PROVIDER_SITE_OTHER): Payer: PPO

## 2020-02-26 DIAGNOSIS — M79605 Pain in left leg: Secondary | ICD-10-CM

## 2020-02-26 DIAGNOSIS — Z23 Encounter for immunization: Secondary | ICD-10-CM | POA: Diagnosis not present

## 2020-02-26 DIAGNOSIS — M79604 Pain in right leg: Secondary | ICD-10-CM

## 2020-02-26 DIAGNOSIS — M62830 Muscle spasm of back: Secondary | ICD-10-CM | POA: Diagnosis not present

## 2020-02-26 DIAGNOSIS — M545 Low back pain, unspecified: Secondary | ICD-10-CM

## 2020-02-26 NOTE — Progress Notes (Signed)
Per [name], NP orders, Flu shot given by Armandina Gemma, CMA.  Patient tolerated injection well.

## 2020-02-26 NOTE — Therapy (Signed)
[place]. [place], [place], [address] Phone: [phone]   Fax:  [phone]  Physical Therapy Treatment  Patient Details  Name: Nicholas Rasmussen MRN: [id number] Date of Birth: 20-Feb-1946 Referring Provider (PT): Nche   Encounter Date: 02/26/2020   PT End of Session - 02/26/20 1342    Visit Number 13    Date for PT Re-Evaluation 03/09/20    Authorization Type [employer]    PT Start Time 1300    PT Stop Time 1352    PT Time Calculation (min) 52 min    Activity Tolerance Patient tolerated treatment well    Behavior During Therapy [facility] for tasks assessed/performed           Past Medical History:  Diagnosis Date  . Allergic rhinitis 05/07/2014  . Dizziness 01/02/2016  . GERD (gastroesophageal reflux disease)   . Headache 03/13/2015  . Left flank pain 01/02/2016  . Low back pain 06/06/2015  . Pain of left side of body 08/13/2015  . Post herpetic neuralgia   . Routine general medical examination at a health care facility 05/07/2014  . Seizure disorder ([place]) 05/07/2014  . Seizures ([place])    Last seizure in the 70s.  . Shingles   . Stye 05/07/2014    Past Surgical History:  Procedure Laterality Date  . COLONOSCOPY    . KNEE SURGERY Left    left miniscus  . UPPER GASTROINTESTINAL ENDOSCOPY    . uvala surgery      There were no vitals filed for this visit.   Subjective Assessment - 02/26/20 1301    Subjective Pt reports a side pain that started Saturday on the L side. No sure why or were the pain came from    Currently in Pain? No/denies                             [facility] Adult PT Treatment/Exercise - 02/26/20 0001      Lumbar Exercises: Stretches   Passive Hamstring Stretch Left;Right;2 reps;10 seconds    Single Knee to Chest Stretch Left;Right;3 reps;10 seconds    Lower Trunk Rotation 4 reps;10 seconds      Lumbar Exercises: Aerobic   Elliptical L10 R5 x 3 min each way       Lumbar Exercises: Machines for Strengthening   Cybex Lumbar Extension black band 2x15    Leg Press 60lb 3x10     Other Lumbar Machine Exercise Rows & Lats 45lb 2x12      Lumbar Exercises: Standing   Shoulder Extension Strengthening;Both;20 reps    Shoulder Extension Limitations 15    Other Standing Lumbar Exercises AR press 25b x 12 each    Other Standing Lumbar Exercises Standing overhead Ext yellow 2x10       Lumbar Exercises: Supine   Dead Bug 10 reps;2 seconds      Modalities   Modalities Electrical Stimulation;Moist Heat      Moist Heat Therapy   Number Minutes Moist Heat 12 Minutes    Moist Heat Location Lumbar Spine      Electrical Stimulation   Electrical Stimulation Location Lumbar    Electrical Stimulation Action IFC    Electrical Stimulation Parameters supine    Electrical Stimulation Goals Pain                    PT Short Term Goals - 01/17/20 1440  PT SHORT TERM GOAL #1   Title independent with initial HEP    Status Achieved             PT Long Term Goals - 02/14/20 1316      PT LONG TERM GOAL #1   Title understand posture and body mechanics    Status Achieved      PT LONG TERM GOAL #2   Title decrease pain 50%    Status Partially Met      PT LONG TERM GOAL #3   Title increase lumbar extension 25% without pain    Status Achieved      PT LONG TERM GOAL #4   Title demonstrate proper sitting posture for 10 minutes    Status Achieved                 Plan - 02/26/20 1344    Clinical Impression Statement Pt enters clinic reporting a new L side pain that but does not know why. All exercises completed well today, some postural cues needed with seated rows and standing shoulder extensions. Pt is very tighten in both piriformis muscles noted with passive stretching Modality for pain.    Stability/Clinical Decision Making Stable/Uncomplicated    PT Next Visit Plan continue to progress exercises as he would like to be able to  go to a gym in the future           Patient will benefit from skilled therapeutic intervention in order to improve the following deficits and impairments:  Decreased range of motion, Difficulty walking, Increased muscle spasms, Pain, Improper body mechanics, Impaired flexibility, Decreased strength, Postural dysfunction  Visit Diagnosis: Pain in left leg  Pain in right leg  Muscle spasm of back  Acute bilateral low back pain without sciatica     Problem List Patient Active Problem List   Diagnosis Date Noted  . Varicose veins of both lower extremities with pain 02/02/2019  . Encounter for therapeutic drug level monitoring 09/19/2018  . Type 2 diabetes mellitus with diabetic polyneuropathy, without long-term current use of insulin ([place]) 03/29/2018  . Diabetic neuropathy ([place]) 03/29/2018  . CAD (coronary artery disease) 08/03/2017  . Post herpetic neuralgia 07/29/2016  . Dizziness 01/02/2016  . Chronic midline low back pain without sciatica 06/06/2015  . GERD (gastroesophageal reflux disease) 11/15/2014  . Seizure disorder ([place]) 05/07/2014  . Allergic rhinitis 05/07/2014  . Routine general medical examination at a health care facility 05/07/2014    Scot Jun 02/26/2020, 1:46 PM  [place]. [place], [place], [address] Phone: [phone]   Fax:  [phone]  Name: Nicholas Rasmussen MRN: [id number] Date of Birth: [birth year]

## 2020-02-28 ENCOUNTER — Encounter: Payer: PPO | Admitting: Physical Therapy

## 2020-02-28 ENCOUNTER — Encounter: Payer: Self-pay | Admitting: Physical Therapy

## 2020-02-28 ENCOUNTER — Other Ambulatory Visit: Payer: Self-pay

## 2020-02-28 ENCOUNTER — Ambulatory Visit: Payer: PPO | Admitting: Physical Therapy

## 2020-02-28 DIAGNOSIS — M79604 Pain in right leg: Secondary | ICD-10-CM

## 2020-02-28 DIAGNOSIS — M62830 Muscle spasm of back: Secondary | ICD-10-CM

## 2020-02-28 DIAGNOSIS — M79605 Pain in left leg: Secondary | ICD-10-CM

## 2020-02-28 DIAGNOSIS — M545 Low back pain, unspecified: Secondary | ICD-10-CM

## 2020-02-28 NOTE — Therapy (Signed)
[place]. [place], [place], [address] Phone: [phone]   Fax:  [phone]  Physical Therapy Treatment  Patient Details  Name: Nicholas Rasmussen MRN: [id number] Date of Birth: 1946-01-12 Referring Provider (PT): Nche   Encounter Date: 02/28/2020   PT End of Session - 02/28/20 1142    Visit Number 14    Date for PT Re-Evaluation 03/09/20    Authorization Type [employer]    PT Start Time 1102    PT Stop Time 1153    PT Time Calculation (min) 51 min    Activity Tolerance Patient tolerated treatment well    Behavior During Therapy [facility] for tasks assessed/performed           Past Medical History:  Diagnosis Date  . Allergic rhinitis 05/07/2014  . Dizziness 01/02/2016  . GERD (gastroesophageal reflux disease)   . Headache 03/13/2015  . Left flank pain 01/02/2016  . Low back pain 06/06/2015  . Pain of left side of body 08/13/2015  . Post herpetic neuralgia   . Routine general medical examination at a health care facility 05/07/2014  . Seizure disorder ([place]) 05/07/2014  . Seizures ([place])    Last seizure in the 70s.  . Shingles   . Stye 05/07/2014    Past Surgical History:  Procedure Laterality Date  . COLONOSCOPY    . KNEE SURGERY Left    left miniscus  . UPPER GASTROINTESTINAL ENDOSCOPY    . uvala surgery      There were no vitals filed for this visit.   Subjective Assessment - 02/28/20 1105    Subjective Side pain is gone, Just the normal 3 pain    Currently in Pain? Yes    Pain Score 3     Pain Location Back                             [facility] Adult PT Treatment/Exercise - 02/28/20 0001      Lumbar Exercises: Aerobic   Elliptical L9 R7 x 3 min each way    UBE (Upper Arm Bike) Constant work 25 watts x2 min each       Lumbar Exercises: Machines for Strengthening   Cybex Lumbar Extension black band 2x15    Cybex Knee Extension 15lb 2x10    Cybex Knee Flexion 45lb  2x10    Leg Press 70lb 3x10     Other Lumbar Machine Exercise Rows & Lats 55lb 2x10      Lumbar Exercises: Standing   Shoulder Extension Strengthening;Both;15 reps   x2   Shoulder Extension Limitations 15    Other Standing Lumbar Exercises Standing overhead Ext yellow 2x10       Modalities   Modalities Electrical Stimulation;Moist Heat      Moist Heat Therapy   Number Minutes Moist Heat 15 Minutes    Moist Heat Location Lumbar Spine      Electrical Stimulation   Electrical Stimulation Location Lumbar    Electrical Stimulation Action IFC    Electrical Stimulation Parameters supine    Electrical Stimulation Goals Pain                    PT Short Term Goals - 01/17/20 1440      PT SHORT TERM GOAL #1   Title independent with initial HEP    Status Achieved             PT  Long Term Goals - 02/28/20 1142      PT LONG TERM GOAL #1   Title understand posture and body mechanics    Status Achieved      PT LONG TERM GOAL #2   Title decrease pain 50%    Status Partially Met      PT LONG TERM GOAL #3   Title increase lumbar extension 25% without pain    Status Achieved      PT LONG TERM GOAL #4   Status Achieved                 Plan - 02/28/20 1143    Clinical Impression Statement Pt enters clinic reporting that the side pain was gone. Increase resistance tolerated on leg press, seated rows, and lat pull downs. Postural cues required with seated rows. Some core weakness noted with shoulder extensions.    Stability/Clinical Decision Making Stable/Uncomplicated    PT Next Visit Plan continue to progress exercises as he would like to be able to go to a gym in the future           Patient will benefit from skilled therapeutic intervention in order to improve the following deficits and impairments:  Decreased range of motion, Difficulty walking, Increased muscle spasms, Pain, Improper body mechanics, Impaired flexibility, Decreased strength, Postural  dysfunction  Visit Diagnosis: Pain in right leg  Muscle spasm of back  Pain in left leg  Acute bilateral low back pain without sciatica     Problem List Patient Active Problem List   Diagnosis Date Noted  . Varicose veins of both lower extremities with pain 02/02/2019  . Encounter for therapeutic drug level monitoring 09/19/2018  . Type 2 diabetes mellitus with diabetic polyneuropathy, without long-term current use of insulin ([place]) 03/29/2018  . Diabetic neuropathy ([place]) 03/29/2018  . CAD (coronary artery disease) 08/03/2017  . Post herpetic neuralgia 07/29/2016  . Dizziness 01/02/2016  . Chronic midline low back pain without sciatica 06/06/2015  . GERD (gastroesophageal reflux disease) 11/15/2014  . Seizure disorder ([place]) 05/07/2014  . Allergic rhinitis 05/07/2014  . Routine general medical examination at a health care facility 05/07/2014    Scot Jun, PTA 02/28/2020, 11:45 AM  [place]. [place], [place], [address] Phone: [phone]   Fax:  [phone]  Name: Nicholas Rasmussen MRN: [id number] Date of Birth: 02/20/1946

## 2020-03-03 ENCOUNTER — Ambulatory Visit: Payer: PPO | Attending: Internal Medicine

## 2020-03-03 ENCOUNTER — Ambulatory Visit: Payer: Self-pay

## 2020-03-03 DIAGNOSIS — Z23 Encounter for immunization: Secondary | ICD-10-CM

## 2020-03-03 NOTE — Progress Notes (Signed)
   Covid-19 Vaccination Clinic  Name:  Rihan Schueler    MRN: [id number] DOB: Dec 19, 1945  03/03/2020  Mr. Robinette was observed post Covid-19 immunization for 15 minutes without incident. He was provided with Vaccine Information Sheet and instruction to access the V-Safe system.   Mr. Postema was instructed to call 911 with any severe reactions post vaccine: Marland Kitchen Difficulty breathing  . Swelling of face and throat  . A fast heartbeat  . A bad rash all over body  . Dizziness and weakness

## 2020-03-04 ENCOUNTER — Encounter: Payer: Self-pay | Admitting: Physical Therapy

## 2020-03-04 ENCOUNTER — Other Ambulatory Visit: Payer: Self-pay

## 2020-03-04 ENCOUNTER — Ambulatory Visit: Payer: PPO | Admitting: Physical Therapy

## 2020-03-04 DIAGNOSIS — M545 Low back pain, unspecified: Secondary | ICD-10-CM

## 2020-03-04 DIAGNOSIS — M79605 Pain in left leg: Secondary | ICD-10-CM | POA: Diagnosis not present

## 2020-03-04 DIAGNOSIS — M62830 Muscle spasm of back: Secondary | ICD-10-CM

## 2020-03-04 DIAGNOSIS — M79604 Pain in right leg: Secondary | ICD-10-CM

## 2020-03-04 NOTE — Therapy (Signed)
[place]. [place], [place], [address] Phone: [phone]   Fax:  [phone]  Physical Therapy Treatment  Patient Details  Name: Nicholas Rasmussen MRN: [id number] Date of Birth: 26-Feb-1946 Referring Provider (PT): N[place]   Encounter Date: 03/04/2020   PT End of Session - 03/04/20 0177    Date for PT Re-Evaluation 03/09/20    Authorization Type [employer]    PT Start Time 1300    PT Stop Time 1353    PT Time Calculation (min) 53 min    Activity Tolerance Patient tolerated treatment well    Behavior During Therapy [facility] for tasks assessed/performed           Past Medical History:  Diagnosis Date  . Allergic rhinitis 05/07/2014  . Dizziness 01/02/2016  . GERD (gastroesophageal reflux disease)   . Headache 03/13/2015  . Left flank pain 01/02/2016  . Low back pain 06/06/2015  . Pain of left side of body 08/13/2015  . Post herpetic neuralgia   . Routine general medical examination at a health care facility 05/07/2014  . Seizure disorder ([place]) 05/07/2014  . Seizures ([place])    Last seizure in the 70s.  . Shingles   . Stye 05/07/2014    Past Surgical History:  Procedure Laterality Date  . COLONOSCOPY    . KNEE SURGERY Left    left miniscus  . UPPER GASTROINTESTINAL ENDOSCOPY    . uvala surgery      There were no vitals filed for this visit.   Subjective Assessment - 03/04/20 1308    Subjective "My usual 3 on the low back"    Currently in Pain? Yes    Pain Score 3     Pain Location Back                             [facility] Adult PT Treatment/Exercise - 03/04/20 0001      Lumbar Exercises: Aerobic   UBE (Upper Arm Bike) Constant work 25 watts x2 min each     Nustep L4 x 5 min      Lumbar Exercises: Machines for Strengthening   Cybex Lumbar Extension black band 2x15    Other Lumbar Machine Exercise Rows & Lats 55lb 2x10      Lumbar Exercises: Standing   Shoulder Extension  Strengthening;Both;15 reps   x2   Shoulder Extension Limitations 15    Other Standing Lumbar Exercises AR press 25b x 12 each    Other Standing Lumbar Exercises HIp Ext 101b 2x10, Overhed Ext yello wball 2x10       Modalities   Modalities Electrical Stimulation;Moist Heat      Moist Heat Therapy   Number Minutes Moist Heat 12 Minutes    Moist Heat Location Lumbar Spine      Electrical Stimulation   Electrical Stimulation Location Lumbar    Electrical Stimulation Action IFC    Electrical Stimulation Parameters supine    Electrical Stimulation Goals Pain                    PT Short Term Goals - 01/17/20 1440      PT SHORT TERM GOAL #1   Title independent with initial HEP    Status Achieved             PT Long Term Goals - 03/04/20 1315      PT LONG TERM GOAL #2  Title decrease pain 50%    Status Partially Met                 Plan - 03/04/20 1347    Clinical Impression Statement Pt reports his typical three pain that does not stop him from doing anything. No issue with today's interventions. Tactile cues to prevent forward leaning needed with hip extensions. Good lumbar ext ROM noted with overhead extensions.    Stability/Clinical Decision Making Stable/Uncomplicated    PT Next Visit Plan continue to progress exercises as he would like to be able to go to a gym in the future           Patient will benefit from skilled therapeutic intervention in order to improve the following deficits and impairments:     Visit Diagnosis: Pain in left leg  Acute bilateral low back pain without sciatica  Muscle spasm of back  Pain in right leg     Problem List Patient Active Problem List   Diagnosis Date Noted  . Varicose veins of both lower extremities with pain 02/02/2019  . Encounter for therapeutic drug level monitoring 09/19/2018  . Type 2 diabetes mellitus with diabetic polyneuropathy, without long-term current use of insulin ([place]) 03/29/2018  .  Diabetic neuropathy ([place]) 03/29/2018  . CAD (coronary artery disease) 08/03/2017  . Post herpetic neuralgia 07/29/2016  . Dizziness 01/02/2016  . Chronic midline low back pain without sciatica 06/06/2015  . GERD (gastroesophageal reflux disease) 11/15/2014  . Seizure disorder ([place]) 05/07/2014  . Allergic rhinitis 05/07/2014  . Routine general medical examination at a health care facility 05/07/2014    Scot Jun, PTA 03/04/2020, 1:50 PM  [place]. [place], [place], [address] Phone: [phone]   Fax:  [phone]  Name: Nicholas Rasmussen MRN: [id number] Date of Birth: [birth year]

## 2020-03-07 ENCOUNTER — Telehealth: Payer: Self-pay

## 2020-03-07 NOTE — Progress Notes (Signed)
Please void, Open in error.  [place] Pharmacist Assistant [phone]

## 2020-03-10 ENCOUNTER — Telehealth: Payer: PPO

## 2020-03-10 ENCOUNTER — Other Ambulatory Visit: Payer: Self-pay

## 2020-03-10 NOTE — Chronic Care Management (AMB) (Deleted)
Chronic Care Management Pharmacy  Name: Nicholas Rasmussen  MRN: [id number] DOB: 11/22/1945  Chief Complaint/ HPI  Nicholas Rasmussen,  74 y.o. , male presents for their Follow-Up CCM visit with the clinical pharmacist via telephone.  PCP : Flossie Buffy, NP  Their chronic conditions include: Type 2 diabetes, hyperlipidemia, CAD, seizure disorder, GERD   Office Visits: 01/21/20: Video Visit with [name] for penile rash. Patient started on acyclovir 5% cream.  12/25/19: Patient presented to [facility] for follow-up. Patient referred to nutrition + diabetes services 10/08/19: Patient presented to [name], NP for follow-up. Metoprolol decreased to 12.5 mg daily due to bradycardia.   08/22/19: Video Visit with [name] for headache and cough. Patient told to take tylenol for headache, and to start omeprazole for cough for 7 days then stop   Consult Visits:  10/23/19: Patient presented to Dr. Tamala Julian (Cardiology) for follow-up. Metoprolol decreased to 12.5 mg daily due to bradycardia.   Medications: Outpatient Encounter Medications as of 03/10/2020  Medication Sig  . acetaminophen (TYLENOL) 500 MG tablet Take 1 tablet (500 mg total) by mouth every 8 (eight) hours as needed.  Marland Kitchen acyclovir cream (ZOVIRAX) 5 % Apply 1 application topically every 6 (six) hours as needed.  Marland Kitchen atorvastatin (LIPITOR) 20 MG tablet TAKE 1 TABLET (20 MG TOTAL) BY MOUTH DAILY AT 6 PM.  . blood glucose meter kit and supplies KIT Dispense based on patient and insurance preference. Use 2-3x/week (fasting), ICD E11.42  . divalproex (DEPAKOTE ER) 500 MG 24 hr tablet Take 1 tablet (500 mg total) by mouth at bedtime. Please call [phone] for appt or you may speak to your PCP about refills.  . gabapentin (NEURONTIN) 300 MG capsule Take 1 capsule (300 mg total) by mouth at bedtime. Please call [phone] to schedule yearly follow up appt.  Marland Kitchen glipiZIDE (GLUCOTROL XL) 5 MG 24 hr tablet Take 1 tablet (5 mg total) by  mouth daily with breakfast.  . Lancets (ONETOUCH DELICA PLUS [id number]) MISC USE AS DIRECTED. TAKE BLOOD SUGAR 2 -3 TIMES PER WEEK BEFORE MEALS.  . metoprolol succinate (TOPROL-XL) 25 MG 24 hr tablet TAKE 1 TABLET BY MOUTH EVERY DAY  . omeprazole (PRILOSEC) 20 MG capsule TAKE 1 CAPSULE (20 MG TOTAL) BY MOUTH DAILY AS NEEDED.  Marland Kitchen ONETOUCH ULTRA test strip USE AS DIRECTED. TEST BLOOD SUGAR 2 - 3 TIMES PER WEEK. BEFORE EATING  . vitamin C (ASCORBIC ACID) 500 MG tablet Take 500 mg by mouth daily.   No facility-administered encounter medications on file as of 03/10/2020.     Current Diagnosis/Assessment:  Goals Addressed   None     Diabetes   Recent Relevant Labs: Lab Results  Component Value Date/Time   HGBA1C 6.2 10/08/2019 09:51 AM   HGBA1C 6.4 04/11/2019 10:31 AM   MICROALBUR 1.5 10/08/2019 09:51 AM   MICROALBUR 3.3 (H) 10/10/2018 09:27 AM     Checking BG: Never. Patient does not have glucose monitor  Recent FBG Readings: n/a Recent pre-meal BG readings: n/a Recent 2hr PP BG readings:  n/a Recent HS BG readings: n/a  Patient has failed these meds in past: Metformin XR 500 mg  Patient is currently controlled on the following medications:   Glipizide XL 5 mg daily  Goal <7%   Last diabetic Foot exam:  Lab Results  Component Value Date/Time   HMDIABEYEEXA No Retinopathy 10/17/2019 09:31 AM    Last diabetic Eye exam: No results found for: HMDIABFOOTEX   We discussed: diet and  exercise extensively. Likes sweets (candies, cooking, sodas). Cut down significantly but will still occassionally indulge. Patient denies symptoms of hypoglycemia. Patient recently acquired blood glucose machine, counseled patient on appropriate use. Counseled patient to check blood sugars if he ever has symptoms of low blood sugar. Counseled patient on symptoms of low blood sugar.   Plan  Continue current medications   Hyperlipidemia w/ CAD   Managed by Dr. Daneen Schick (last seen  10/26/18)  Lipid Panel     Component Value Date/Time   CHOL 130 04/11/2019 1031   CHOL 123 08/04/2017 0904   TRIG 54.0 04/11/2019 1031   HDL 43.80 04/11/2019 1031   HDL 47 08/04/2017 0904   CHOLHDL 3 04/11/2019 1031   VLDL 10.8 04/11/2019 1031   LDLCALC 75 04/11/2019 1031   LDLCALC 65 08/04/2017 0904   LABVLDL 11 08/04/2017 0904    CT Chest (06/22/17) Coronary artery calcium score 151 with Mild CAD in the distal left main, proximal to mid LAD and mid RCA.   The 10-year ASCVD risk score Mikey Bussing DC Brooke Bonito., et al., 2013) is: 21.8%   Values used to calculate the score:     Age: 74 years     Sex: Male     Is Non-Hispanic African American: Yes     Diabetic: Yes     Tobacco smoker: No     Systolic Blood Pressure: 086 mmHg     Is BP treated: No     HDL Cholesterol: 43.8 mg/dL     Total Cholesterol: 130 mg/dL   Patient has failed these meds in past: aspirin  Patient is currently uncontrolled on the following medications:   Atorvastatin 20 mg daily   Metoprolol XL 12.5 mg daily  LDL Goal <70   We discussed:  diet and exercise extensively. Patient tolerating atorvastatin well and denies side effects.  Plan  Continue current medications   Seizure Disorder   Managed by Dr. Marcial Pacas. Last seen 10/19/18   Patient has failed these meds in past:  Patient is currently controlled on the following medications:   Divalproex ER 500 mg at bedtime   Gabapentin 300 QHS PRN - Shingles Outbreak  We discussed:  Has not had seizure in 30+ years   Plan  Continue current medications  GERD   Patient has failed these meds in past: n/a Patient is currently controlled on the following medications:   Omeprazole 20 mg daily PRN   We discussed:  Belches frequently. Some reflux when he goes to lie down.   Plan  Continue current medications  Misc/OTC   APAP 500 mg q8hr PRN -headache  Vitamin C 500 mg daily   Plan  Continue current medications   Vaccines   Reviewed and discussed  patient's vaccination history.    Immunization History  Administered Date(s) Administered  . Fluad Quad(high Dose 65+) 02/01/2019, 02/26/2020  . Influenza Whole 02/21/2014  . Influenza, High Dose Seasonal PF 02/03/2016, 02/03/2017, 02/28/2018  . Influenza,inj,Quad PF,6+ Mos 03/13/2015  . PFIZER SARS-COV-2 Vaccination 07/22/2019, 08/21/2019, 03/03/2020  . Pneumococcal Conjugate-13 05/06/2015  . Pneumococcal Polysaccharide-23 05/07/2014  . Tdap 05/25/2011  . Zoster Recombinat (Shingrix) 12/03/2016, 02/16/2017    Plan  Medication Management   [facility]. Does not use pillbox.   Follow up: 6 months   Doristine Section [phone]

## 2020-03-11 ENCOUNTER — Ambulatory Visit: Payer: PPO | Admitting: Physical Therapy

## 2020-03-11 ENCOUNTER — Other Ambulatory Visit: Payer: Self-pay | Admitting: Neurology

## 2020-03-11 ENCOUNTER — Ambulatory Visit (INDEPENDENT_AMBULATORY_CARE_PROVIDER_SITE_OTHER): Payer: PPO | Admitting: Nurse Practitioner

## 2020-03-11 ENCOUNTER — Encounter: Payer: Self-pay | Admitting: Nurse Practitioner

## 2020-03-11 VITALS — BP 114/68 | HR 60 | Temp 97.2°F | Ht 76.0 in | Wt 247.8 lb

## 2020-03-11 DIAGNOSIS — L089 Local infection of the skin and subcutaneous tissue, unspecified: Secondary | ICD-10-CM

## 2020-03-11 DIAGNOSIS — L723 Sebaceous cyst: Secondary | ICD-10-CM | POA: Diagnosis not present

## 2020-03-11 MED ORDER — CEPHALEXIN 500 MG PO CAPS: 500.0000 mg | ORAL_CAPSULE | Freq: Two times a day (BID) | ORAL | 0 refills | Status: DC

## 2020-03-11 NOTE — Patient Instructions (Signed)
Apply warm compress 3x/day You will be contacted to schedule appt with general surgeon.

## 2020-03-11 NOTE — Progress Notes (Signed)
Subjective:  Patient ID: Nicholas Rasmussen, male    DOB: February 20, 1946  Age: 74 y.o. MRN: [id number]  CC: Acute Visit (Pt c/o boil in middle of his back that he would like to look at and discuss. Pt states it has been there for a long time. )  HPI Nicholas Rasmussen presents with possibly infected cyst on mid upper back. States, cyst has been present for several years, but became red and painful in last 1week. Denies any radicular pain or fever or SOB.  Reviewed past Medical, Social and Family history today.  Outpatient Medications Prior to Visit  Medication Sig Dispense Refill  . acetaminophen (TYLENOL) 500 MG tablet Take 1 tablet (500 mg total) by mouth every 8 (eight) hours as needed. 30 tablet 0  . acyclovir cream (ZOVIRAX) 5 % Apply 1 application topically every 6 (six) hours as needed. 5 g 3  . atorvastatin (LIPITOR) 20 MG tablet TAKE 1 TABLET (20 MG TOTAL) BY MOUTH DAILY AT 6 PM. 90 tablet 3  . blood glucose meter kit and supplies KIT Dispense based on patient and insurance preference. Use 2-3x/week (fasting), ICD E11.42 1 each 0  . divalproex (DEPAKOTE ER) 500 MG 24 hr tablet Take 1 tablet (500 mg total) by mouth at bedtime. Please call [phone] for appt or you may speak to your PCP about refills. 90 tablet 0  . gabapentin (NEURONTIN) 300 MG capsule Take 1 capsule (300 mg total) by mouth at bedtime. Please call [phone] to schedule yearly follow up appt. 90 capsule 1  . glipiZIDE (GLUCOTROL XL) 5 MG 24 hr tablet Take 1 tablet (5 mg total) by mouth daily with breakfast. 90 tablet 3  . Lancets (ONETOUCH DELICA PLUS [id number]) MISC USE AS DIRECTED. TAKE BLOOD SUGAR 2 -3 TIMES PER WEEK BEFORE MEALS. 100 each 3  . metoprolol succinate (TOPROL-XL) 25 MG 24 hr tablet TAKE 1 TABLET BY MOUTH EVERY DAY 90 tablet 2  . omeprazole (PRILOSEC) 20 MG capsule TAKE 1 CAPSULE (20 MG TOTAL) BY MOUTH DAILY AS NEEDED. 90 capsule 1  . ONETOUCH ULTRA test strip USE AS DIRECTED. TEST BLOOD SUGAR 2 - 3 TIMES PER WEEK.  BEFORE EATING 25 strip 1  . vitamin C (ASCORBIC ACID) 500 MG tablet Take 500 mg by mouth daily.     No facility-administered medications prior to visit.   ROS See HPI  Objective:  BP 114/68 (BP Location: Left Arm, Patient Position: Sitting, Cuff Size: Large)   Pulse 60   Temp (!) 97.2 F (36.2 C) (Temporal)   Ht $R'6\' 4"'wG$  (1.93 m)   Wt 247 lb 12.8 oz (112.4 kg)   SpO2 98%   BMI 30.16 kg/m   Physical Exam Vitals reviewed.  Skin:    Findings: Erythema and rash present. Rash is nodular.       Neurological:     Mental Status: He is alert and oriented to person, place, and time.      Assessment & Plan:  This visit occurred during the SARS-CoV-2 public health emergency.  Safety protocols were in place, including screening questions prior to the visit, additional usage of staff PPE, and extensive cleaning of exam room while observing appropriate contact time as indicated for disinfecting solutions.   Varnell was seen today for acute visit.  Diagnoses and all orders for this visit:  Infected sebaceous cyst -     cephALEXin (KEFLEX) 500 MG capsule; Take 1 capsule (500 mg total) by mouth 2 (two) times daily. -  Ambulatory referral to General Surgery  Apply warm compress 3x/day You will be contacted to schedule appt with general surgeon.  Problem List Items Addressed This Visit    None    Visit Diagnoses    Infected sebaceous cyst    -  Primary   Relevant Medications   cephALEXin (KEFLEX) 500 MG capsule   Other Relevant Orders   Ambulatory referral to General Surgery      Follow-up: Return if symptoms worsen or fail to improve.  [name], NP

## 2020-03-25 ENCOUNTER — Other Ambulatory Visit: Payer: Self-pay

## 2020-03-26 ENCOUNTER — Ambulatory Visit: Payer: PPO | Admitting: Nurse Practitioner

## 2020-03-26 ENCOUNTER — Telehealth: Payer: Self-pay

## 2020-03-26 NOTE — Progress Notes (Signed)
Left voice message to  confirmed patient telephone appointment on 03/27/2020 for CCM at 2:00pm with [name] the Clinical pharmacist.   [place] Pharmacist Assistant [phone]

## 2020-03-27 ENCOUNTER — Ambulatory Visit: Payer: PPO

## 2020-03-27 DIAGNOSIS — L723 Sebaceous cyst: Secondary | ICD-10-CM | POA: Diagnosis not present

## 2020-03-27 DIAGNOSIS — L089 Local infection of the skin and subcutaneous tissue, unspecified: Secondary | ICD-10-CM | POA: Diagnosis not present

## 2020-03-27 NOTE — Chronic Care Management (AMB) (Signed)
Chronic Care Management Pharmacy  Name: Nicholas Rasmussen  MRN: [id number] DOB: 1945-10-02  Chief Complaint/ HPI  Nicholas Rasmussen,  74 y.o. , male presents for their Follow-Up CCM visit with the clinical pharmacist via telephone.  Nicholas Rasmussen : Nicholas Buffy, NP  Their chronic conditions include: Type 2 diabetes, hyperlipidemia, CAD, seizure disorder, GERD   Office Visits: 03/11/20: Patient presented to Nicholas Rasmussen for infected cyst. Patient started on cephalexin 500 mg twice daily. 01/21/20: Video Visit with Nicholas Rasmussen for penile rash. Patient started on acyclovir 5% cream.  12/25/19: Patient presented to [facility] for follow-up. Patient referred to nutrition + diabetes services 10/08/19: Patient presented to Nicholas Lacy, NP for follow-up. Metoprolol decreased to 12.5 mg daily due to bradycardia.   08/22/19: Video Visit with Nicholas Rasmussen for headache and cough. Patient told to take tylenol for headache, and to start omeprazole for cough for 7 days then stop   Consult Visits:  03/27/20: Nicholas Rasmussen started patient on doxycycline 100 mg for 5 days.  10/23/19: Patient presented to Nicholas Rasmussen (Cardiology) for follow-up. Metoprolol decreased to 12.5 mg daily due to bradycardia.   Medications: Outpatient Encounter Medications as of 03/27/2020  Medication Sig  . gabapentin (NEURONTIN) 300 MG capsule Take 1 capsule (300 mg total) by mouth at bedtime. Please call [phone] to schedule yearly follow up appt.  Marland Kitchen acetaminophen (TYLENOL) 500 MG tablet Take 1 tablet (500 mg total) by mouth every 8 (eight) hours as needed.  Marland Kitchen acyclovir cream (ZOVIRAX) 5 % Apply 1 application topically every 6 (six) hours as needed.  Marland Kitchen atorvastatin (LIPITOR) 20 MG tablet TAKE 1 TABLET (20 MG TOTAL) BY MOUTH DAILY AT 6 PM.  . blood glucose meter kit and supplies KIT Dispense based on patient and insurance preference. Use 2-3x/week (fasting), ICD E11.42  . cephALEXin (KEFLEX) 500 MG capsule Take 1 capsule (500 mg  total) by mouth 2 (two) times daily.  . divalproex (DEPAKOTE ER) 500 MG 24 hr tablet Take 1 tablet (500 mg total) by mouth at bedtime. Please call [phone] for appt or you may speak to your Nicholas Rasmussen about refills.  Marland Kitchen glipiZIDE (GLUCOTROL XL) 5 MG 24 hr tablet Take 1 tablet (5 mg total) by mouth daily with breakfast.  . Lancets (ONETOUCH DELICA PLUS [id number]) MISC USE AS DIRECTED. TAKE BLOOD SUGAR 2 -3 TIMES PER WEEK BEFORE MEALS.  . metoprolol succinate (TOPROL-XL) 25 MG 24 hr tablet TAKE 1 TABLET BY MOUTH EVERY DAY  . omeprazole (PRILOSEC) 20 MG capsule TAKE 1 CAPSULE (20 MG TOTAL) BY MOUTH DAILY AS NEEDED.  Marland Kitchen ONETOUCH ULTRA test strip USE AS DIRECTED. TEST BLOOD SUGAR 2 - 3 TIMES PER WEEK. BEFORE EATING  . vitamin C (ASCORBIC ACID) 500 MG tablet Take 500 mg by mouth daily.   No facility-administered encounter medications on file as of 03/27/2020.     Current Diagnosis/Assessment:  SDOH Interventions     Most Recent Value  SDOH Interventions  Financial Strain Interventions Intervention Not Indicated  Transportation Interventions Intervention Not Indicated     Goals Addressed            This Visit's Progress   . Chronic Care Management   On track    CARE PLAN ENTRY  Current Barriers:  . Chronic Disease Management support, education, and care coordination needs related to Type 2 diabetes, coronary artery disease, seizure disorder, heartburn  Clinical Goal(s): Over the next 90 days, patient will:  . Work with the care management team to address educational,  disease management, and care coordination needs  . Begin or continue self health monitoring activities as directed today  . Call provider office for new or worsened signs and symptoms  . Call care management team with questions or concerns . Maintain A1c less than 7%  . Achieve LDL (Bad cholesterol) less than 70  Interventions:  . Evaluation of current treatment plans and patient's adherence to plan as established by  provider . Assessed patient understanding of disease states . Assessed patient's education and care coordination needs . Provided disease specific education to patient   Patient Self Care Activities:  . Patient agrees to check blood sugar sporadically and when having symptoms of hypoglycemia    Telephone follow up appointment with pharmacy team member scheduled for: 03/10/20 at 1:00 PM       Diabetes   Recent Relevant Labs: Lab Results  Component Value Date/Time   HGBA1C 6.2 10/08/2019 09:51 AM   HGBA1C 6.4 04/11/2019 10:31 AM   MICROALBUR 1.5 10/08/2019 09:51 AM   MICROALBUR 3.3 (H) 10/10/2018 09:27 AM     Checking BG: Never.   Recent FBG Readings: n/a Recent pre-meal BG readings: n/a Recent 2hr PP BG readings:  n/a Recent HS BG readings: n/a  Patient has failed these meds in past: Metformin XR 500 mg  Patient is currently controlled on the following medications:   Glipizide XL 5 mg daily  Goal <7%   Last diabetic Foot exam:  Lab Results  Component Value Date/Time   HMDIABEYEEXA No Retinopathy 10/17/2019 09:31 AM    Last diabetic Eye exam: No results found for: HMDIABFOOTEX   We discussed: diet and exercise extensively. Likes sweets (candies, cooking, sodas). Cut down significantly but will still occassionally indulge. Patient denies symptoms of hypoglycemia.   Plan  Continue current medications   Hyperlipidemia w/ CAD   Managed by Nicholas Rasmussen (last seen 10/26/18)  Lipid Panel     Component Value Date/Time   CHOL 130 04/11/2019 1031   CHOL 123 08/04/2017 0904   TRIG 54.0 04/11/2019 1031   HDL 43.80 04/11/2019 1031   HDL 47 08/04/2017 0904   CHOLHDL 3 04/11/2019 1031   VLDL 10.8 04/11/2019 1031   LDLCALC 75 04/11/2019 1031   LDLCALC 65 08/04/2017 0904   LABVLDL 11 08/04/2017 0904    CT Chest (06/22/17) Coronary artery calcium score 151 with Mild CAD in the distal left main, proximal to mid LAD and mid RCA.   The 10-year ASCVD risk score Nicholas Rasmussen DC  Nicholas Rasmussen., et al., 2013) is: 17.3%   Values used to calculate the score:     Age: 63 years     Sex: Male     Is Non-Hispanic African American: Yes     Diabetic: Yes     Tobacco smoker: No     Systolic Blood Pressure: 341 mmHg     Is BP treated: No     HDL Cholesterol: 43.8 mg/dL     Total Cholesterol: 130 mg/dL   Patient has failed these meds in past: aspirin  Patient is currently uncontrolled on the following medications:   Atorvastatin 20 mg daily   Metoprolol XL 12.5 mg daily  LDL Goal <70   We discussed:  diet and exercise extensively. Patient tolerating atorvastatin well and denies side effects.  Plan  Continue current medications   Seizure Disorder   Managed by Dr. Marcial Pacas. Last seen 10/19/18   Patient has failed these meds in past:  Patient is currently controlled on the  following medications:   Divalproex ER 500 mg at bedtime  We discussed:  Has not had seizure in 30+ years   Plan  Continue current medications  GERD   Patient has failed these meds in past: n/a Patient is currently controlled on the following medications:   Omeprazole 20 mg daily PRN    We discussed:  Belches frequently. Some reflux when he goes to lie down.   Plan  Continue current medications  Misc/OTC   APAP 500 mg q8hr PRN -headache  Vitamin C 500 mg daily  Gabapentin 300 QHS  Plan  Continue current medications   Vaccines   Reviewed and discussed patient's vaccination history.    Immunization History  Administered Date(s) Administered  . Fluad Quad(high Dose 65+) 02/01/2019, 02/26/2020  . Influenza Whole 02/21/2014  . Influenza, High Dose Seasonal PF 02/03/2016, 02/03/2017, 02/28/2018  . Influenza,inj,Quad PF,6+ Mos 03/13/2015  . PFIZER SARS-COV-2 Vaccination 07/22/2019, 08/21/2019, 03/03/2020  . Pneumococcal Conjugate-13 05/06/2015  . Pneumococcal Polysaccharide-23 05/07/2014  . Tdap 05/25/2011  . Zoster Recombinat (Shingrix) 12/03/2016, 02/16/2017    Medication  Management   [facility]. Does not use pillbox.   Follow up: 6 months   Doristine Section [phone]

## 2020-03-30 ENCOUNTER — Other Ambulatory Visit: Payer: Self-pay | Admitting: Neurology

## 2020-03-31 ENCOUNTER — Encounter: Payer: Self-pay | Admitting: Neurology

## 2020-03-31 ENCOUNTER — Ambulatory Visit (INDEPENDENT_AMBULATORY_CARE_PROVIDER_SITE_OTHER): Payer: PPO | Admitting: Neurology

## 2020-03-31 VITALS — BP 117/64 | HR 54 | Ht 76.0 in | Wt 252.0 lb

## 2020-03-31 DIAGNOSIS — B0229 Other postherpetic nervous system involvement: Secondary | ICD-10-CM

## 2020-03-31 DIAGNOSIS — M545 Low back pain, unspecified: Secondary | ICD-10-CM | POA: Diagnosis not present

## 2020-03-31 DIAGNOSIS — G40909 Epilepsy, unspecified, not intractable, without status epilepticus: Secondary | ICD-10-CM | POA: Diagnosis not present

## 2020-03-31 DIAGNOSIS — G8929 Other chronic pain: Secondary | ICD-10-CM

## 2020-03-31 MED ORDER — DIVALPROEX SODIUM ER 500 MG PO TB24
500.0000 mg | ORAL_TABLET | Freq: Every day | ORAL | 4 refills | Status: DC
Start: 2020-03-31 — End: 2021-06-08

## 2020-03-31 MED ORDER — GABAPENTIN 300 MG PO CAPS
300.0000 mg | ORAL_CAPSULE | Freq: Every day | ORAL | 4 refills | Status: DC
Start: 2020-03-31 — End: 2021-06-15

## 2020-03-31 NOTE — Progress Notes (Signed)
Chief Complaint  Patient presents with  . Seizures    Denies any seizure activity. He has continued taking Depakote ER, one tablet at bedtime.   Marland Kitchen Post herpetic neuralgia    Gabapentin $RemoveBef'300mg'lTPyBzWHga$ , one capsule at bedtime is helpful for his symptoms.    HISTORICAL  HISTORICAL  Nicholas Rasmussen is a 74 year old right-handed male, seen in refer by his primary care doctor  [name] for evaluation of postherpetic neuralgia, initial evaluation was on March 8th 2018.  He had a history of epilepsy, first seizure was in high school, had about 4 generalized seizure in his lifetime, last seizure was in 1975, all seizure has similar seminology, he would woke up from sleep and felt body vibrating sensation, few seconds later, he would get into generalized tonic-clonic seizure, he was initially treated with Dilantin, after most recent recurrent seizure, he was switched to current dose of Depakote DR 250 mg twice a day, he tolerated the medication well, there was no seizure, no auras.  On July 13 2016, he noticed right frontal area headaches, which was quite unusual for him, next day, he noticed rash broke out, nerve pain, sensitive to touch, he was diagnosed with shingles, was treated with veltrex 1000 mg tid and tapering dose of prednisone for 1 week, rash lasted about 1 week, gradually resolved, for a while, he has sharp and neuropathic pain at right frontal region, difficulty sleeping.   Now his pain has much improved, he is not taking gabapentin 300 mg/600 mg, mild dizziness and lightheadedness with medications, he can sleep much better now  Personally reviewed CT head without contrast November 2017 that was normal. Laboratory evaluation normal CMP, CBC, hemoglobin was 12.5, A1c 6.2  UPDATE Feb 03 2017: Laboratory evaluation seeing August 2018 showed negative troponin, normal CMP, negative d-dimer, CBC, He rarely has right frontal shingles pain, only taking gabapentin occasionally,  occasionally right frontal headache is resolved by over-the-counter NSAIDs,  UPDATE July 26 2017; He did very well, was able to switch to Depakote ER 500 mg every night, tolerating it well, no recurrent seizure, no significant facial pain, only taking gabapentin as needed.  Virtual Visit via Video May 28th 2020  I connected with Nicholas Rasmussen on 03/31/20 at  by Video   He has occasional right frontal area discomfort, take gabapentin 300 mg at nighttime as needed, does not take it regularly  He continue with Depakote ER 500 mg every night, has no recurrent seizures.  UPDATE Mar 31 2020: He is doing well, continue with Depakote ER every night, no recurrent seizure, Right frontal area postherpetic neuralgia has much improved, He is now taking gabapentin 300 mg every night for right-sided low back pain, right calf area muscle cramping, which has been helpful,   REVIEW OF SYSTEMS: Full 14 system review of systems performed and notable only for as above All other review of systems were negative.  ALLERGIES: No Known Allergies  HOME MEDICATIONS: Current Outpatient Medications  Medication Sig Dispense Refill  . acetaminophen (TYLENOL) 500 MG tablet Take 1 tablet (500 mg total) by mouth every 8 (eight) hours as needed. 30 tablet 0  . acyclovir cream (ZOVIRAX) 5 % Apply 1 application topically every 6 (six) hours as needed. 5 g 3  . atorvastatin (LIPITOR) 20 MG tablet TAKE 1 TABLET (20 MG TOTAL) BY MOUTH DAILY AT 6 PM. 90 tablet 3  . blood glucose meter kit and supplies KIT Dispense based on patient and insurance preference. Use 2-3x/week (fasting), ICD  E11.42 1 each 0  . divalproex (DEPAKOTE ER) 500 MG 24 hr tablet Take 1 tablet (500 mg total) by mouth at bedtime. Please call [phone] for appt or you may speak to your PCP about refills. 90 tablet 0  . gabapentin (NEURONTIN) 300 MG capsule Take 1 capsule (300 mg total) by mouth at bedtime. Please call [phone] to schedule yearly follow up appt.  90 capsule 1  . glipiZIDE (GLUCOTROL XL) 5 MG 24 hr tablet Take 1 tablet (5 mg total) by mouth daily with breakfast. 90 tablet 3  . Lancets (ONETOUCH DELICA PLUS [id number]) MISC USE AS DIRECTED. TAKE BLOOD SUGAR 2 -3 TIMES PER WEEK BEFORE MEALS. 100 each 3  . metoprolol succinate (TOPROL-XL) 25 MG 24 hr tablet TAKE 1 TABLET BY MOUTH EVERY DAY 90 tablet 2  . omeprazole (PRILOSEC) 20 MG capsule TAKE 1 CAPSULE (20 MG TOTAL) BY MOUTH DAILY AS NEEDED. 90 capsule 1  . ONETOUCH ULTRA test strip USE AS DIRECTED. TEST BLOOD SUGAR 2 - 3 TIMES PER WEEK. BEFORE EATING 25 strip 1  . vitamin C (ASCORBIC ACID) 500 MG tablet Take 500 mg by mouth daily.     No current facility-administered medications for this visit.    PAST MEDICAL HISTORY: Past Medical History:  Diagnosis Date  . Allergic rhinitis 05/07/2014  . Dizziness 01/02/2016  . GERD (gastroesophageal reflux disease)   . Headache 03/13/2015  . Left flank pain 01/02/2016  . Low back pain 06/06/2015  . Pain of left side of body 08/13/2015  . Post herpetic neuralgia   . Routine general medical examination at a health care facility 05/07/2014  . Seizure disorder ([place]) 05/07/2014  . Seizures ([place])    Last seizure in the 70s.  . Shingles   . Stye 05/07/2014    PAST SURGICAL HISTORY: Past Surgical History:  Procedure Laterality Date  . COLONOSCOPY    . KNEE SURGERY Left    left miniscus  . UPPER GASTROINTESTINAL ENDOSCOPY    . uvala surgery      FAMILY HISTORY: Family History  Problem Relation Age of Onset  . Diabetes Father   . Hypertension Father   . Other Mother        natural causes    SOCIAL HISTORY: Social History   Socioeconomic History  . Marital status: Single    Spouse name: Not on file  . Number of children: 3  . Years of education: Associates  . Highest education level: Not on file  Occupational History  . Occupation: Retired    Comment: Oceanographer  Tobacco Use  . Smoking status: Former Research scientist (life sciences)  . Smokeless  tobacco: Never Used  Vaping Use  . Vaping Use: Never used  Substance and Sexual Activity  . Alcohol use: Yes    Alcohol/week: 0.0 standard drinks    Comment: occasional  . Drug use: No  . Sexual activity: Not on file  Other Topics Concern  . 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   Right-handed.   Rarely drinks caffeine.   Social Determinants of Health   Financial Resource Strain: Low Risk   . Difficulty of Paying Living Expenses: Not hard at all  Food Insecurity: No Food Insecurity  . Worried About Charity fundraiser in the Last Year: Never true  . Ran Out of Food in the  Last Year: Never true  Transportation Needs: No Transportation Needs  . Lack of Transportation (Medical): No  . Lack of Transportation (Non-Medical): No  Physical Activity: Sufficiently Active  . Days of Exercise per Week: 3 days  . Minutes of Exercise per Session: 60 min  Stress: No Stress Concern Present  . Feeling of Stress : Not at all  Social Connections: Moderately Integrated  . Frequency of Communication with Friends and Family: More than three times a week  . Frequency of Social Gatherings with Friends and Family: More than three times a week  . Attends Religious Services: More than 4 times per year  . Active Member of Clubs or Organizations: Yes  . Attends Archivist Meetings: Not on file  . Marital Status: Divorced  Human resources officer Violence: Not At Risk  . Fear of Current or Ex-Partner: No  . Emotionally Abused: No  . Physically Abused: No  . Sexually Abused: No     PHYSICAL EXAM   Vitals:   03/31/20 1314  BP: 117/64  Pulse: (!) 54  Weight: 252 lb (114.3 kg)  Height: $Remove'6\' 4"'vDYKCUk$  (1.93 m)   Not recorded     Body mass index is 30.67 kg/m.  PHYSICAL EXAMNIATION:  Gen: NAD, conversant, well nourised, well groomed                 NEUROLOGICAL EXAM:  MENTAL STATUS: Speech/cognition: Awake, alert, oriented to history taking care of conversation   CRANIAL NERVES: CN II: Visual fields are full to confrontation. Pupils are round equal and briskly reactive to light. CN III, IV, VI: extraocular movement are normal. No ptosis. CN V: Facial sensation is intact to light touch CN VII: Face is symmetric with normal eye closure  CN VIII: Hearing is normal to causal conversation. CN IX, X: Phonation is normal. CN XI: Head turning and shoulder shrug are intact  MOTOR: There is no pronator drift of out-stretched arms. Muscle bulk and tone are normal. Muscle strength is normal.  REFLEXES: Reflexes are 2+ and symmetric at the biceps, triceps, knees, and ankles. Plantar responses are flexor.  SENSORY: Intact to light touch, pinprick and vibratory sensation are intact in fingers and toes.  COORDINATION: There is no trunk or limb dysmetria noted.  GAIT/STANCE: He can get up from seated position arm crossed   DIAGNOSTIC DATA (LABS, IMAGING, TESTING) - I reviewed patient records, labs, notes, testing and imaging myself where available.   ASSESSMENT AND PLAN  Nicholas Rasmussen is a 73 y.o. male   Epilepsy  Last reported seizure was in 1975, he had 4 generalized seizure, tolerating Depakote ER 500 mg every night, refilled his prescription  Right-sided low back pain, right calf muscle cramps  Gabapentin 300 mg as needed  Right frontal area postherpetic neuralgia  Much improved  Marcial Pacas, M.D. Ph.D.  [facility] [address], [place], [place] [address] Ph: [phone] Fax: [phone]  CC:  Flossie Buffy, NP [address] [place],  [place] [address]  Nche, Charlene Brooke, NP

## 2020-04-08 ENCOUNTER — Other Ambulatory Visit: Payer: Self-pay

## 2020-04-09 ENCOUNTER — Encounter: Payer: Self-pay | Admitting: Nurse Practitioner

## 2020-04-09 ENCOUNTER — Other Ambulatory Visit (INDEPENDENT_AMBULATORY_CARE_PROVIDER_SITE_OTHER): Payer: PPO

## 2020-04-09 ENCOUNTER — Ambulatory Visit (INDEPENDENT_AMBULATORY_CARE_PROVIDER_SITE_OTHER)
Admission: RE | Admit: 2020-04-09 | Discharge: 2020-04-09 | Disposition: A | Discharge status: Home / Self Care | Payer: PPO | Source: Ambulatory Visit | Attending: Nurse Practitioner | Admitting: Nurse Practitioner

## 2020-04-09 ENCOUNTER — Ambulatory Visit (INDEPENDENT_AMBULATORY_CARE_PROVIDER_SITE_OTHER): Payer: PPO | Admitting: Nurse Practitioner

## 2020-04-09 VITALS — BP 126/66 | HR 78 | Temp 96.9°F | Ht 76.0 in | Wt 249.0 lb

## 2020-04-09 DIAGNOSIS — R079 Chest pain, unspecified: Secondary | ICD-10-CM

## 2020-04-09 DIAGNOSIS — I25119 Atherosclerotic heart disease of native coronary artery with unspecified angina pectoris: Secondary | ICD-10-CM

## 2020-04-09 DIAGNOSIS — R21 Rash and other nonspecific skin eruption: Secondary | ICD-10-CM

## 2020-04-09 DIAGNOSIS — E1142 Type 2 diabetes mellitus with diabetic polyneuropathy: Secondary | ICD-10-CM

## 2020-04-09 LAB — LIPID PANEL
Cholesterol: 115 mg/dL (ref 0–200)
HDL: 43.5 mg/dL (ref >39.00)
LDL Cholesterol: 59 mg/dL (ref 0–99)
NonHDL: 71.48
Total CHOL/HDL Ratio: 3
Triglycerides: 60 mg/dL (ref 0.0–149.0)
VLDL: 12 mg/dL (ref 0.0–40.0)

## 2020-04-09 LAB — POCT GLYCOSYLATED HEMOGLOBIN (HGB A1C): Hemoglobin A1C: 6.1 % — AB (ref 4.0–5.6)

## 2020-04-09 MED ORDER — VALACYCLOVIR HCL 1 G PO TABS: 1000.0000 mg | ORAL_TABLET | Freq: Two times a day (BID) | ORAL | 0 refills | Status: AC

## 2020-04-09 NOTE — Progress Notes (Signed)
Subjective:  Patient ID: Nicholas Rasmussen, male    DOB: 1946/03/08  Age: 74 y.o. MRN: [id number]  CC: Follow-up (6 month f/u on DM, HTN, and hyperlipidemia. Pt is fasting. Pt has pain on his right side that comes and goes that he would like to discuss. )  Chest Pain  This is a new problem. The current episode started 1 to 4 weeks ago. The onset quality is gradual. The problem occurs intermittently. The problem has been waxing and waning. The pain is present in the lateral region. The quality of the pain is described as dull. The pain does not radiate. Pertinent negatives include no abdominal pain, back pain, claudication, cough, diaphoresis, dizziness, exertional chest pressure, fever, headaches, irregular heartbeat, leg pain, lower extremity edema, malaise/fatigue, nausea, orthopnea, palpitations, PND, shortness of breath or sputum production. The pain is aggravated by lifting arm, movement and breathing. He has tried nothing for the symptoms. Risk factors include being elderly and obesity.  His past medical history is significant for CAD.  Pertinent negatives for past medical history include no anxiety/panic attacks, no aortic aneurysm, no arrhythmia, no hypertension and no MI. Prior diagnostic workup includes echocardiogram.   Type 2 diabetes mellitus with diabetic polyneuropathy, without long-term current use of insulin (HCC) Controlled with hgbA1c of 6.1 Continue glipizide  CAD (coronary artery disease) LDL at goal with atorvastatin Lipid Panel     Component Value Date/Time   CHOL 115 04/09/2020 1018   CHOL 123 08/04/2017 0904   TRIG 60.0 04/09/2020 1018   HDL 43.50 04/09/2020 1018   HDL 47 08/04/2017 0904   CHOLHDL 3 04/09/2020 1018   VLDL 12.0 04/09/2020 1018   LDLCALC 59 04/09/2020 1018   LDLCALC 65 08/04/2017 0904   LABVLDL 11 08/04/2017 0904   maintain current medication  Needs valtrex refilled for recurrent herpetic rash on penile region  Reviewed past Medical, Social and  Family history today.  Outpatient Medications Prior to Visit  Medication Sig Dispense Refill  . acetaminophen (TYLENOL) 500 MG tablet Take 1 tablet (500 mg total) by mouth every 8 (eight) hours as needed. 30 tablet 0  . acyclovir cream (ZOVIRAX) 5 % Apply 1 application topically every 6 (six) hours as needed. 5 g 3  . atorvastatin (LIPITOR) 20 MG tablet TAKE 1 TABLET (20 MG TOTAL) BY MOUTH DAILY AT 6 PM. 90 tablet 3  . blood glucose meter kit and supplies KIT Dispense based on patient and insurance preference. Use 2-3x/week (fasting), ICD E11.42 1 each 0  . divalproex (DEPAKOTE ER) 500 MG 24 hr tablet Take 1 tablet (500 mg total) by mouth at bedtime. Please call [phone] for appt or you may speak to your PCP about refills. 90 tablet 4  . doxycycline (VIBRA-TABS) 100 MG tablet Take 100 mg by mouth 2 (two) times daily.    Marland Kitchen gabapentin (NEURONTIN) 300 MG capsule Take 1 capsule (300 mg total) by mouth at bedtime. 90 capsule 4  . glipiZIDE (GLUCOTROL XL) 5 MG 24 hr tablet Take 1 tablet (5 mg total) by mouth daily with breakfast. 90 tablet 3  . Lancets (ONETOUCH DELICA PLUS [id number]) MISC USE AS DIRECTED. TAKE BLOOD SUGAR 2 -3 TIMES PER WEEK BEFORE MEALS. 100 each 3  . metoprolol succinate (TOPROL-XL) 25 MG 24 hr tablet TAKE 1 TABLET BY MOUTH EVERY DAY 90 tablet 2  . omeprazole (PRILOSEC) 20 MG capsule TAKE 1 CAPSULE (20 MG TOTAL) BY MOUTH DAILY AS NEEDED. 90 capsule 1  . ONETOUCH ULTRA test strip  USE AS DIRECTED. TEST BLOOD SUGAR 2 - 3 TIMES PER WEEK. BEFORE EATING 25 strip 1  . vitamin C (ASCORBIC ACID) 500 MG tablet Take 500 mg by mouth daily.     No facility-administered medications prior to visit.    ROS See HPI  Objective:  BP 126/66 (BP Location: Left Arm, Patient Position: Sitting, Cuff Size: Large)   Pulse 78   Temp (!) 96.9 F (36.1 C) (Temporal)   Ht $R'6\' 4"'tj$  (1.93 m)   Wt 249 lb (112.9 kg)   BMI 30.31 kg/m   Physical Exam Cardiovascular:     Rate and Rhythm: Normal rate and  regular rhythm.     Pulses: Normal pulses.     Heart sounds: Normal heart sounds.  Pulmonary:     Effort: Pulmonary effort is normal.     Breath sounds: Normal breath sounds.  Chest:     Chest wall: No mass, tenderness or crepitus.     Breasts:        Right: Normal.   Abdominal:     General: Bowel sounds are normal.     Palpations: Abdomen is soft.  Lymphadenopathy:     Upper Body:     Right upper body: No supraclavicular, axillary or pectoral adenopathy.     Left upper body: No supraclavicular, axillary or pectoral adenopathy.  Skin:    Findings: No erythema or rash.  Neurological:     Mental Status: He is alert and oriented to person, place, and time.    Assessment & Plan:  This visit occurred during the SARS-CoV-2 public health emergency.  Safety protocols were in place, including screening questions prior to the visit, additional usage of staff PPE, and extensive cleaning of exam room while observing appropriate contact time as indicated for disinfecting solutions.   Nicholas Rasmussen was seen today for follow-up.  Diagnoses and all orders for this visit:  Type 2 diabetes mellitus with diabetic polyneuropathy, without long-term current use of insulin ([place]) -     Cancel: Lipid panel; Future -     POCT glycosylated hemoglobin (Hb A1C) -     Lipid panel; Future  Coronary artery disease involving native coronary artery of native heart with angina pectoris ([place]) -     Cancel: Lipid panel; Future -     Lipid panel; Future  Penile rash -     valACYclovir (VALTREX) 1000 MG tablet; Take 1 tablet (1,000 mg total) by mouth 2 (two) times daily for 10 days.  Right-sided chest pain -     DG Chest 2 View  Use tylenol for pain.  Problem List Items Addressed This Visit      Cardiovascular and Mediastinum   CAD (coronary artery disease)    LDL at goal with atorvastatin Lipid Panel     Component Value Date/Time   CHOL 115 04/09/2020 1018   CHOL 123 08/04/2017 0904   TRIG 60.0  04/09/2020 1018   HDL 43.50 04/09/2020 1018   HDL 47 08/04/2017 0904   CHOLHDL 3 04/09/2020 1018   VLDL 12.0 04/09/2020 1018   LDLCALC 59 04/09/2020 1018   LDLCALC 65 08/04/2017 0904   LABVLDL 11 08/04/2017 0904   maintain current medication      Relevant Orders   Lipid panel (Completed)     Endocrine   Type 2 diabetes mellitus with diabetic polyneuropathy, without long-term current use of insulin (HCC) - Primary    Controlled with hgbA1c of 6.1 Continue glipizide      Relevant Orders  POCT glycosylated hemoglobin (Hb A1C) (Completed)   Lipid panel (Completed)    Other Visit Diagnoses    Penile rash       Relevant Medications   valACYclovir (VALTREX) 1000 MG tablet   Right-sided chest pain       Relevant Orders   DG Chest 2 View      Follow-up: Return in about 6 months (around 10/07/2020) for DM and HTN, hyperlipidemia.  [name], NP

## 2020-04-09 NOTE — Patient Instructions (Addendum)
Go to [address] for CXR and lipid panel draw  Use tylenol for pain.  HgbA1c of 6.1 Maintain current medication

## 2020-04-09 NOTE — Assessment & Plan Note (Signed)
Controlled with hgbA1c of 6.1 Continue glipizide

## 2020-04-09 NOTE — Assessment & Plan Note (Signed)
LDL at goal with atorvastatin Lipid Panel     Component Value Date/Time   CHOL 115 04/09/2020 1018   CHOL 123 08/04/2017 0904   TRIG 60.0 04/09/2020 1018   HDL 43.50 04/09/2020 1018   HDL 47 08/04/2017 0904   CHOLHDL 3 04/09/2020 1018   VLDL 12.0 04/09/2020 1018   LDLCALC 59 04/09/2020 1018   LDLCALC 65 08/04/2017 0904   LABVLDL 11 08/04/2017 0904   maintain current medication

## 2020-04-15 ENCOUNTER — Other Ambulatory Visit: Payer: Self-pay

## 2020-04-16 ENCOUNTER — Encounter: Payer: Self-pay | Admitting: Nurse Practitioner

## 2020-04-16 ENCOUNTER — Ambulatory Visit (INDEPENDENT_AMBULATORY_CARE_PROVIDER_SITE_OTHER): Payer: PPO | Admitting: Nurse Practitioner

## 2020-04-16 VITALS — BP 127/69 | HR 49 | Temp 96.3°F | Ht 76.0 in | Wt 250.0 lb

## 2020-04-16 DIAGNOSIS — R079 Chest pain, unspecified: Secondary | ICD-10-CM | POA: Diagnosis not present

## 2020-04-16 DIAGNOSIS — N2889 Other specified disorders of kidney and ureter: Secondary | ICD-10-CM

## 2020-04-16 NOTE — Progress Notes (Signed)
Subjective:  Patient ID: Nicholas Rasmussen, male    DOB: 06-Sep-1945  Age: 74 y.o. MRN: [id number]  CC: Acute Visit (Pt c/o right side pain pt states pain is the same as last seen )  HPI Nicholas Rasmussen presents with persistent right chest wall pain, gradually improving, denies any new symptoms, use of tylenol $RemoveBe'1000mg'XZbWOTMGH$  daily as need with significant improvement, worse with certain sleeping position. Recent CXR 04/09/2020: normal. Denies any upper respiratory symptoms or exertional CP or diaphoresis He is also due to repeat Ct renal to re eval left complex renal mass 3mm noted on precious CT ABD/pelvis 01/2019.  Reviewed past Medical, Social and Family history today.  Outpatient Medications Prior to Visit  Medication Sig Dispense Refill  . acetaminophen (TYLENOL) 500 MG tablet Take 1 tablet (500 mg total) by mouth every 8 (eight) hours as needed. 30 tablet 0  . acyclovir cream (ZOVIRAX) 5 % Apply 1 application topically every 6 (six) hours as needed. 5 g 3  . atorvastatin (LIPITOR) 20 MG tablet TAKE 1 TABLET (20 MG TOTAL) BY MOUTH DAILY AT 6 PM. 90 tablet 3  . blood glucose meter kit and supplies KIT Dispense based on patient and insurance preference. Use 2-3x/week (fasting), ICD E11.42 1 each 0  . divalproex (DEPAKOTE ER) 500 MG 24 hr tablet Take 1 tablet (500 mg total) by mouth at bedtime. Please call [phone] for appt or you may speak to your PCP about refills. 90 tablet 4  . gabapentin (NEURONTIN) 300 MG capsule Take 1 capsule (300 mg total) by mouth at bedtime. 90 capsule 4  . glipiZIDE (GLUCOTROL XL) 5 MG 24 hr tablet Take 1 tablet (5 mg total) by mouth daily with breakfast. 90 tablet 3  . Lancets (ONETOUCH DELICA PLUS [id number]) MISC USE AS DIRECTED. TAKE BLOOD SUGAR 2 -3 TIMES PER WEEK BEFORE MEALS. 100 each 3  . metoprolol succinate (TOPROL-XL) 25 MG 24 hr tablet TAKE 1 TABLET BY MOUTH EVERY DAY 90 tablet 2  . ONETOUCH ULTRA test strip USE AS DIRECTED. TEST BLOOD SUGAR 2 - 3 TIMES PER WEEK.  BEFORE EATING 25 strip 1  . valACYclovir (VALTREX) 1000 MG tablet Take 1 tablet (1,000 mg total) by mouth 2 (two) times daily for 10 days. 20 tablet 0  . doxycycline (VIBRA-TABS) 100 MG tablet Take 100 mg by mouth 2 (two) times daily. (Patient not taking: Reported on 04/16/2020)    . omeprazole (PRILOSEC) 20 MG capsule TAKE 1 CAPSULE (20 MG TOTAL) BY MOUTH DAILY AS NEEDED. (Patient not taking: Reported on 04/16/2020) 90 capsule 1  . vitamin C (ASCORBIC ACID) 500 MG tablet Take 500 mg by mouth daily. (Patient not taking: Reported on 04/16/2020)     No facility-administered medications prior to visit.    ROS See HPI  Objective:  BP 127/69 (BP Location: Left Arm, Patient Position: Sitting, Cuff Size: Normal)   Pulse (!) 49   Temp (!) 96.3 F (35.7 C) (Temporal)   Ht $R'6\' 4"'TR$  (1.93 m)   Wt 250 lb (113.4 kg)   SpO2 98%   BMI 30.43 kg/m   Physical Exam Vitals reviewed.  Cardiovascular:     Rate and Rhythm: Normal rate and regular rhythm.     Heart sounds: Normal heart sounds.  Pulmonary:     Effort: Pulmonary effort is normal.     Breath sounds: Normal breath sounds.  Chest:     Chest wall: No tenderness.  Abdominal:     General: There is no distension.  Palpations: Abdomen is soft.     Tenderness: There is no abdominal tenderness. There is no right CVA tenderness, left CVA tenderness or guarding.  Skin:    General: Skin is warm and dry.     Findings: No erythema or rash.  Neurological:     Mental Status: He is alert.    Assessment & Plan:  This visit occurred during the SARS-CoV-2 public health emergency.  Safety protocols were in place, including screening questions prior to the visit, additional usage of staff PPE, and extensive cleaning of exam room while observing appropriate contact time as indicated for disinfecting solutions.   Nicholas Rasmussen was seen today for acute visit.  Diagnoses and all orders for this visit:  Right-sided chest pain  Other specified disorders of  kidney and ureter -     CT RENAL ABD W/WO; Future  Provided re assurance: explained Normal physical exam and CXR results Pain due to muscle strain. Advised to Take tylenol as discussed.  Problem List Items Addressed This Visit    None    Visit Diagnoses    Right-sided chest pain    -  Primary   Other specified disorders of kidney and ureter       Relevant Orders   CT RENAL ABD W/WO      I have spent 22mins with this patient regarding history taking, documentation, review of  radiology, formulating plan and discussing treatment options with patient.  Follow-up: No follow-ups on file.  [name], NP

## 2020-04-16 NOTE — Patient Instructions (Addendum)
Normal exam Pain due to muscle strain. Take tylenol as discussed.  Chest Wall Pain Chest wall pain is pain in or around the bones and muscles of your chest. Sometimes, an injury causes this pain. Excessive coughing or overuse of arm and chest muscles may also cause chest wall pain. Sometimes, the cause may not be known. This pain may take several weeks or longer to get better. Follow these instructions at home: Managing pain, stiffness, and swelling   If directed, put ice on the painful area: ? Put ice in a plastic bag. ? Place a towel between your skin and the bag. ? Leave the ice on for 20 minutes, 2-3 times per day. Activity  Rest as told by your health care provider.  Avoid activities that cause pain. These include any activities that use your chest muscles or your abdominal and side muscles to lift heavy items. Ask your health care provider what activities are safe for you. General instructions   Take over-the-counter and prescription medicines only as told by your health care provider.  Do not use any products that contain nicotine or tobacco, such as cigarettes, e-cigarettes, and chewing tobacco. These can delay healing after injury. If you need help quitting, ask your health care provider.  Keep all follow-up visits as told by your health care provider. This is important. Contact a health care provider if:  You have a fever.  Your chest pain becomes worse.  You have new symptoms. Get help right away if:  You have nausea or vomiting.  You feel sweaty or light-headed.  You have a cough with mucus from your lungs (sputum) or you cough up blood.  You develop shortness of breath. These symptoms may represent a serious problem that is an emergency. Do not wait to see if the symptoms will go away. Get medical help right away. Call your local emergency services (911 in the [place] Do not drive yourself to the hospital. Summary  Chest wall pain is pain in or around the  bones and muscles of your chest.  Depending on the cause, it may be treated with ice, rest, medicines, and avoiding activities that cause pain.  Contact a health care provider if you have a fever, worsening chest pain, or new symptoms.  Get help right away if you feel light-headed or you develop shortness of breath. These symptoms may be an emergency. This information is not intended to replace advice given to you by your health care provider. Make sure you discuss any questions you have with your health care provider. Document Revised: 11/10/2017 Document Reviewed: 11/10/2017 Elsevier Patient Education  2020 [employer].

## 2020-04-25 ENCOUNTER — Telehealth: Payer: Self-pay

## 2020-04-25 NOTE — Progress Notes (Signed)
Leave voice message  to confirmed patient office appointment on 04/28/2020 for CCM at 1:00 PM with [name] the Clinical pharmacist.    [place] Pharmacist Assistant [phone]

## 2020-04-28 ENCOUNTER — Ambulatory Visit: Payer: PPO

## 2020-05-07 ENCOUNTER — Other Ambulatory Visit: Payer: PPO

## 2020-05-28 ENCOUNTER — Ambulatory Visit (INDEPENDENT_AMBULATORY_CARE_PROVIDER_SITE_OTHER): Payer: PPO | Admitting: Family Medicine

## 2020-05-28 ENCOUNTER — Other Ambulatory Visit: Payer: Self-pay

## 2020-05-28 ENCOUNTER — Encounter: Payer: Self-pay | Admitting: Family Medicine

## 2020-05-28 VITALS — BP 120/68 | HR 79 | Temp 99.0°F | Ht 76.0 in | Wt 251.0 lb

## 2020-05-28 DIAGNOSIS — H1031 Unspecified acute conjunctivitis, right eye: Secondary | ICD-10-CM | POA: Diagnosis not present

## 2020-05-28 DIAGNOSIS — L723 Sebaceous cyst: Secondary | ICD-10-CM | POA: Diagnosis not present

## 2020-05-28 MED ORDER — POLYMYXIN B-TRIMETHOPRIM 10000-0.1 UNIT/ML-% OP SOLN: 2.0000 [drp] | Freq: Four times a day (QID) | OPHTHALMIC | 0 refills | Status: DC

## 2020-05-28 NOTE — Patient Instructions (Signed)
Artificial tears like Refresh and Systane may be used for comfort. OK to get generic version. Generally people use them every 2-4 hours, but you can use them as much as you want because there is no medication in it.  Try not to touch your face.  Continue the warm compresses. Massage toward the eye lashes.   Let us know if you need anything.

## 2020-05-28 NOTE — Progress Notes (Signed)
Chief Complaint  Patient presents with  . Eye Problem    Right eye    Nicholas Rasmussen is here for right eye irritation.  Duration: 1 d Chemical exposure? No  Recent URI? No  Contact lenses? No  History of allergies? No   No eye pain or vision changes.  Treatment to date: OTC cream, warm compresses  Past Medical History:  Diagnosis Date  . Allergic rhinitis 05/07/2014  . Dizziness 01/02/2016  . GERD (gastroesophageal reflux disease)   . Headache 03/13/2015  . Left flank pain 01/02/2016  . Low back pain 06/06/2015  . Pain of left side of body 08/13/2015  . Post herpetic neuralgia   . Routine general medical examination at a health care facility 05/07/2014  . Seizure disorder (HCC) 05/07/2014  . Seizures (HCC)    Last seizure in the 70s.  . Shingles   . Stye 05/07/2014   Family History  Problem Relation Age of Onset  . Diabetes Father   . Hypertension Father   . Other Mother        natural causes    BP 120/68 (BP Location: Left Arm, Patient Position: Sitting, Cuff Size: Normal)   Pulse 79   Temp 99 F (37.2 C) (Oral)   Ht 6\' 4"  (1.93 m)   Wt 251 lb (113.9 kg)   SpO2 98%   BMI 30.55 kg/m  Gen: Awake, alert, appears stated age Eyes: Lid injected on R upper and lower,  hordeolum noted on lower lid centrally and near angle, Sclera injected on R, PERRLA, EOMi; WNL on L Lungs: No access msc use Psych: Age appropriate judgment and insight; mood and affect normal  Acute conjunctivitis of right eye, unspecified acute conjunctivitis type - Plan: trimethoprim-polymyxin b (POLYTRIM) ophthalmic solution  Orders as above. Instructed to practice good hand hygiene and try not to touch face. Warm compresses and artificial tears also recommended. F/u if no improvement in 7-10 days. Pt voiced understanding and agreement to the plan.  9-10 Carlyss, DO 05/28/20 11:52 AM

## 2020-05-29 ENCOUNTER — Other Ambulatory Visit: Payer: PPO

## 2020-06-02 ENCOUNTER — Ambulatory Visit: Payer: PPO

## 2020-06-04 ENCOUNTER — Ambulatory Visit: Payer: PPO | Admitting: Dietician

## 2020-06-09 ENCOUNTER — Other Ambulatory Visit: Payer: Self-pay

## 2020-06-10 ENCOUNTER — Ambulatory Visit (INDEPENDENT_AMBULATORY_CARE_PROVIDER_SITE_OTHER): Payer: PPO | Admitting: Nurse Practitioner

## 2020-06-10 ENCOUNTER — Encounter: Payer: Self-pay | Admitting: Nurse Practitioner

## 2020-06-10 VITALS — BP 114/68 | HR 60 | Temp 97.0°F | Ht 76.0 in | Wt 251.2 lb

## 2020-06-10 DIAGNOSIS — M25562 Pain in left knee: Secondary | ICD-10-CM | POA: Diagnosis not present

## 2020-06-10 DIAGNOSIS — G8929 Other chronic pain: Secondary | ICD-10-CM | POA: Diagnosis not present

## 2020-06-10 NOTE — Assessment & Plan Note (Signed)
Mr. Koller reports chronic in L.knee pain, onset 80yrs ago after meniscus injury. S/p left knee arthrospcopy. Pain in intermittent, no known trigger. Worse with prolonged inactivity. Improves with movement and use of knee sleeve. Denies any swelling or stiffness or redness or instability. Denies any recent injury.  Continue use of knee sleeve Use tylenol or ibuprofen for pain prn Call office if worsening pain and/or new symptoms.

## 2020-06-10 NOTE — Patient Instructions (Signed)
Continue use of compression knee sleeve. Start knee strengthening exercise daily.   Journal for Nurse Practitioners, 15(4), [phone]. Retrieved February 27, 2018 from http://clinicalkey.com/nursing">  Knee Exercises Ask your health care provider which exercises are safe for you. Do exercises exactly as told by your health care provider and adjust them as directed. It is normal to feel mild stretching, pulling, tightness, or discomfort as you do these exercises. Stop right away if you feel sudden pain or your pain gets worse. Do not begin these exercises until told by your health care provider. Stretching and range-of-motion exercises These exercises warm up your muscles and joints and improve the movement and flexibility of your knee. These exercises also help to relieve pain and swelling. Knee extension, prone 1. Lie on your abdomen (prone position) on a bed. 2. Place your left / right knee just beyond the edge of the surface so your knee is not on the bed. You can put a towel under your left / right thigh just above your kneecap for comfort. 3. Relax your leg muscles and allow gravity to straighten your knee (extension). You should feel a stretch behind your left / right knee. 4. Hold this position for ___10_______ seconds. 5. Scoot up so your knee is supported between repetitions. Repeat _____10_____ times. Complete this exercise ____1_____ times a day. Knee flexion, active 1. Lie on your back with both legs straight. If this causes back discomfort, bend your left / right knee so your foot is flat on the floor. 2. Slowly slide your left / right heel back toward your buttocks. Stop when you feel a gentle stretch in the front of your knee or thigh (flexion). 3. Hold this position for __________ seconds. 4. Slowly slide your left / right heel back to the starting position. Repeat __________ times. Complete this exercise __________ times a day.   Quadriceps stretch, prone 1. Lie on your abdomen on  a firm surface, such as a bed or padded floor. 2. Bend your left / right knee and hold your ankle. If you cannot reach your ankle or pant leg, loop a belt around your foot and grab the belt instead. 3. Gently pull your heel toward your buttocks. Your knee should not slide out to the side. You should feel a stretch in the front of your thigh and knee (quadriceps). 4. Hold this position for __________ seconds. Repeat __________ times. Complete this exercise __________ times a day.   Hamstring, supine 1. Lie on your back (supine position). 2. Loop a belt or towel over the ball of your left / right foot. The ball of your foot is on the walking surface, right under your toes. 3. Straighten your left / right knee and slowly pull on the belt to raise your leg until you feel a gentle stretch behind your knee (hamstring). ? Do not let your knee bend while you do this. ? Keep your other leg flat on the floor. 4. Hold this position for __________ seconds. Repeat __________ times. Complete this exercise __________ times a day. Strengthening exercises These exercises build strength and endurance in your knee. Endurance is the ability to use your muscles for a long time, even after they get tired. Quadriceps, isometric This exercise stretches the muscles in front of your thigh (quadriceps) without moving your knee joint (isometric). 1. Lie on your back with your left / right leg extended and your other knee bent. Put a rolled towel or small pillow under your knee if told by your  health care provider. 2. Slowly tense the muscles in the front of your left / right thigh. You should see your kneecap slide up toward your hip or see increased dimpling just above the knee. This motion will push the back of the knee toward the floor. 3. For __________ seconds, hold the muscle as tight as you can without increasing your pain. 4. Relax the muscles slowly and completely. Repeat __________ times. Complete this exercise  __________ times a day.   Straight leg raises This exercise stretches the muscles in front of your thigh (quadriceps) and the muscles that move your hips (hip flexors). 1. Lie on your back with your left / right leg extended and your other knee bent. 2. Tense the muscles in the front of your left / right thigh. You should see your kneecap slide up or see increased dimpling just above the knee. Your thigh may even shake a bit. 3. Keep these muscles tight as you raise your leg 4-6 inches (10-15 cm) off the floor. Do not let your knee bend. 4. Hold this position for __________ seconds. 5. Keep these muscles tense as you lower your leg. 6. Relax your muscles slowly and completely after each repetition. Repeat __________ times. Complete this exercise __________ times a day. Hamstring, isometric 1. Lie on your back on a firm surface. 2. Bend your left / right knee about __________ degrees. 3. Dig your left / right heel into the surface as if you are trying to pull it toward your buttocks. Tighten the muscles in the back of your thighs (hamstring) to "dig" as hard as you can without increasing any pain. 4. Hold this position for __________ seconds. 5. Release the tension gradually and allow your muscles to relax completely for __________ seconds after each repetition. Repeat __________ times. Complete this exercise __________ times a day. Hamstring curls If told by your health care provider, do this exercise while wearing ankle weights. Begin with __________ lb weights. Then increase the weight by 1 lb (0.5 kg) increments. Do not wear ankle weights that are more than __________ lb. 1. Lie on your abdomen with your legs straight. 2. Bend your left / right knee as far as you can without feeling pain. Keep your hips flat against the floor. 3. Hold this position for __________ seconds. 4. Slowly lower your leg to the starting position. Repeat __________ times. Complete this exercise __________ times a  day.   Squats This exercise strengthens the muscles in front of your thigh and knee (quadriceps). 1. Stand in front of a table, with your feet and knees pointing straight ahead. You may rest your hands on the table for balance but not for support. 2. Slowly bend your knees and lower your hips like you are going to sit in a chair. ? Keep your weight over your heels, not over your toes. ? Keep your lower legs upright so they are parallel with the table legs. ? Do not let your hips go lower than your knees. ? Do not bend lower than told by your health care provider. ? If your knee pain increases, do not bend as low. 3. Hold the squat position for __________ seconds. 4. Slowly push with your legs to return to standing. Do not use your hands to pull yourself to standing. Repeat __________ times. Complete this exercise __________ times a day. Wall slides This exercise strengthens the muscles in front of your thigh and knee (quadriceps). 1. Lean your back against a smooth wall or  door, and walk your feet out 18-24 inches (46-61 cm) from it. 2. Place your feet hip-width apart. 3. Slowly slide down the wall or door until your knees bend __________ degrees. Keep your knees over your heels, not over your toes. Keep your knees in line with your hips. 4. Hold this position for __________ seconds. Repeat __________ times. Complete this exercise __________ times a day.   Straight leg raises This exercise strengthens the muscles that rotate the leg at the hip and move it away from your body (hip abductors). 1. Lie on your side with your left / right leg in the top position. Lie so your head, shoulder, knee, and hip line up. You may bend your bottom knee to help you keep your balance. 2. Roll your hips slightly forward so your hips are stacked directly over each other and your left / right knee is facing forward. 3. Leading with your heel, lift your top leg 4-6 inches (10-15 cm). You should feel the muscles  in your outer hip lifting. ? Do not let your foot drift forward. ? Do not let your knee roll toward the ceiling. 4. Hold this position for __________ seconds. 5. Slowly return your leg to the starting position. 6. Let your muscles relax completely after each repetition. Repeat __________ times. Complete this exercise __________ times a day.   Straight leg raises This exercise stretches the muscles that move your hips away from the front of the pelvis (hip extensors). 1. Lie on your abdomen on a firm surface. You can put a pillow under your hips if that is more comfortable. 2. Tense the muscles in your buttocks and lift your left / right leg about 4-6 inches (10-15 cm). Keep your knee straight as you lift your leg. 3. Hold this position for __________ seconds. 4. Slowly lower your leg to the starting position. 5. Let your leg relax completely after each repetition. Repeat __________ times. Complete this exercise __________ times a day. This information is not intended to replace advice given to you by your health care provider. Make sure you discuss any questions you have with your health care provider. Document Revised: 02/28/2018 Document Reviewed: 02/28/2018 Elsevier Patient Education  2021 [employer].

## 2020-06-10 NOTE — Progress Notes (Signed)
Subjective:  Patient ID: Nicholas Rasmussen, male    DOB: Oct 01, 1945  Age: 75 y.o. MRN: [id number]  CC: Acute Visit (Pt c/o knee pain x 3 days. Pt denies any known injury.)  HPI  Chronic pain of left knee Nicholas Rasmussen reports chronic in L.knee pain, onset 50yrs ago after meniscus injury. S/p left knee arthrospcopy. Pain in intermittent, no known trigger. Worse with prolonged inactivity. Improves with movement and use of knee sleeve. Denies any swelling or stiffness or redness or instability. Denies any recent injury.  Continue use of knee sleeve Use tylenol or ibuprofen for pain prn Call office if worsening pain and/or new symptoms.   Reviewed past Medical, Social and Family history today.  Outpatient Medications Prior to Visit  Medication Sig Dispense Refill  . acetaminophen (TYLENOL) 500 MG tablet Take 1 tablet (500 mg total) by mouth every 8 (eight) hours as needed. 30 tablet 0  . acyclovir cream (ZOVIRAX) 5 % Apply 1 application topically every 6 (six) hours as needed. 5 g 3  . atorvastatin (LIPITOR) 20 MG tablet TAKE 1 TABLET (20 MG TOTAL) BY MOUTH DAILY AT 6 PM. 90 tablet 3  . blood glucose meter kit and supplies KIT Dispense based on patient and insurance preference. Use 2-3x/week (fasting), ICD E11.42 1 each 0  . divalproex (DEPAKOTE ER) 500 MG 24 hr tablet Take 1 tablet (500 mg total) by mouth at bedtime. Please call [phone] for appt or you may speak to your PCP about refills. 90 tablet 4  . gabapentin (NEURONTIN) 300 MG capsule Take 1 capsule (300 mg total) by mouth at bedtime. 90 capsule 4  . glipiZIDE (GLUCOTROL XL) 5 MG 24 hr tablet Take 1 tablet (5 mg total) by mouth daily with breakfast. 90 tablet 3  . Lancets (ONETOUCH DELICA PLUS [id number]) MISC USE AS DIRECTED. TAKE BLOOD SUGAR 2 -3 TIMES PER WEEK BEFORE MEALS. 100 each 3  . omeprazole (PRILOSEC) 20 MG capsule TAKE 1 CAPSULE (20 MG TOTAL) BY MOUTH DAILY AS NEEDED. 90 capsule 1  . ONETOUCH ULTRA test strip USE AS  DIRECTED. TEST BLOOD SUGAR 2 - 3 TIMES PER WEEK. BEFORE EATING 25 strip 1  . trimethoprim-polymyxin b (POLYTRIM) ophthalmic solution Place 2 drops into the right eye every 6 (six) hours. 10 mL 0   No facility-administered medications prior to visit.    ROS See HPI  Objective:  BP 114/68 (BP Location: Left Arm, Patient Position: Sitting, Cuff Size: Large)   Pulse 60   Temp (!) 97 F (36.1 C) (Temporal)   Ht $R'6\' 4"'Rm$  (1.93 m)   Wt 251 lb 3.2 oz (113.9 kg)   SpO2 98%   BMI 30.58 kg/m   Physical Exam Musculoskeletal:     Right knee: Crepitus present.     Instability Tests: Anterior drawer test negative. Posterior drawer test negative.     Left knee: Crepitus present.     Instability Tests: Anterior drawer test negative. Posterior drawer test negative.     Right lower leg: Normal.     Left lower leg: Normal.    Assessment & Plan:  This visit occurred during the SARS-CoV-2 public health emergency.  Safety protocols were in place, including screening questions prior to the visit, additional usage of staff PPE, and extensive cleaning of exam room while observing appropriate contact time as indicated for disinfecting solutions.   [name] was seen today for acute visit.  Diagnoses and all orders for this visit:  Chronic pain of left knee  Problem List Items Addressed This Visit      Other   Chronic pain of left knee - Primary    Nicholas Rasmussen reports chronic in L.knee pain, onset 26yrs ago after meniscus injury. S/p left knee arthrospcopy. Pain in intermittent, no known trigger. Worse with prolonged inactivity. Improves with movement and use of knee sleeve. Denies any swelling or stiffness or redness or instability. Denies any recent injury.  Continue use of knee sleeve Use tylenol or ibuprofen for pain prn Call office if worsening pain and/or new symptoms.         Follow-up: Return in about 6 months (around 12/08/2020) for DM and HTN, hyperlipidemia (fasting,  56mins).  Nicholas Lacy, NP

## 2020-06-12 ENCOUNTER — Other Ambulatory Visit: Payer: PPO

## 2020-06-26 ENCOUNTER — Other Ambulatory Visit: Payer: Self-pay

## 2020-06-26 ENCOUNTER — Ambulatory Visit
Admission: RE | Admit: 2020-06-26 | Discharge: 2020-06-26 | Disposition: A | Discharge status: Home / Self Care | Payer: PPO | Source: Ambulatory Visit | Attending: Nurse Practitioner | Admitting: Nurse Practitioner

## 2020-06-26 DIAGNOSIS — K7689 Other specified diseases of liver: Secondary | ICD-10-CM | POA: Diagnosis not present

## 2020-06-26 DIAGNOSIS — N2889 Other specified disorders of kidney and ureter: Secondary | ICD-10-CM

## 2020-06-26 DIAGNOSIS — I700 Atherosclerosis of aorta: Secondary | ICD-10-CM | POA: Diagnosis not present

## 2020-06-26 DIAGNOSIS — N281 Cyst of kidney, acquired: Secondary | ICD-10-CM | POA: Diagnosis not present

## 2020-06-26 MED ORDER — IOPAMIDOL (ISOVUE-300) INJECTION 61%
100.0000 mL | Freq: Once | INTRAVENOUS | Status: AC | PRN
  Administered 2020-06-26: 100 mL via INTRAVENOUS

## 2020-06-27 ENCOUNTER — Other Ambulatory Visit: Payer: Self-pay | Admitting: Nurse Practitioner

## 2020-06-27 DIAGNOSIS — N2889 Other specified disorders of kidney and ureter: Secondary | ICD-10-CM

## 2020-06-28 ENCOUNTER — Telehealth: Payer: Self-pay | Admitting: Nurse Practitioner

## 2020-06-28 NOTE — Telephone Encounter (Signed)
-----   Message from Brittany Gill, CMA sent at 06/27/2020  4:51 PM EST ----- Pt would like more information regarding imaging he has questions and would like a call or would like to know if he needs an appointment.  

## 2020-06-30 ENCOUNTER — Telehealth: Payer: Self-pay | Admitting: Nurse Practitioner

## 2020-06-30 NOTE — Telephone Encounter (Signed)
Explained CT ABD report to [name]. I explained the changes noted and need for MRI. He verbalized understanding and agreed to MRI ordered. He schedule MRI on 07/22/2020 and lab appt for BMP on 07/14/2020

## 2020-06-30 NOTE — Telephone Encounter (Signed)
-----   Message from Arcelia Jew, [place] sent at 06/27/2020  4:51 PM EST ----- Pt would like more information regarding imaging he has questions and would like a call or would like to know if he needs an appointment.

## 2020-07-01 NOTE — Telephone Encounter (Signed)
Done

## 2020-07-14 ENCOUNTER — Other Ambulatory Visit: Payer: PPO

## 2020-07-14 ENCOUNTER — Other Ambulatory Visit: Payer: Self-pay

## 2020-07-15 ENCOUNTER — Other Ambulatory Visit: Payer: PPO

## 2020-07-15 ENCOUNTER — Encounter: Payer: Self-pay | Admitting: Nurse Practitioner

## 2020-07-15 ENCOUNTER — Ambulatory Visit (INDEPENDENT_AMBULATORY_CARE_PROVIDER_SITE_OTHER): Payer: PPO | Admitting: Nurse Practitioner

## 2020-07-15 VITALS — BP 120/72 | HR 80 | Temp 97.1°F | Ht 76.0 in | Wt 253.4 lb

## 2020-07-15 DIAGNOSIS — W57XXXA Bitten or stung by nonvenomous insect and other nonvenomous arthropods, initial encounter: Secondary | ICD-10-CM | POA: Diagnosis not present

## 2020-07-15 DIAGNOSIS — L0213 Carbuncle of neck: Secondary | ICD-10-CM

## 2020-07-15 DIAGNOSIS — S1096XA Insect bite of unspecified part of neck, initial encounter: Secondary | ICD-10-CM | POA: Diagnosis not present

## 2020-07-15 DIAGNOSIS — R519 Headache, unspecified: Secondary | ICD-10-CM

## 2020-07-15 DIAGNOSIS — N2889 Other specified disorders of kidney and ureter: Secondary | ICD-10-CM | POA: Diagnosis not present

## 2020-07-15 LAB — BASIC METABOLIC PANEL
BUN: 13 mg/dL (ref 6–23)
CO2: 33 mEq/L — ABNORMAL HIGH (ref 19–32)
Calcium: 8.9 mg/dL (ref 8.4–10.5)
Chloride: 102 mEq/L (ref 96–112)
Creatinine, Ser: 1.08 mg/dL (ref 0.40–1.50)
GFR: 68.65 mL/min (ref >60.00)
Glucose, Bld: 106 mg/dL — ABNORMAL HIGH (ref 70–99)
Potassium: 4.1 mEq/L (ref 3.5–5.1)
Sodium: 141 mEq/L (ref 135–145)

## 2020-07-15 LAB — SEDIMENTATION RATE: Sed Rate: 9 mm/hr (ref 0–20)

## 2020-07-15 MED ORDER — DOXYCYCLINE HYCLATE 100 MG PO TABS: 100.0000 mg | ORAL_TABLET | Freq: Two times a day (BID) | ORAL | 0 refills | Status: AC

## 2020-07-15 NOTE — Progress Notes (Signed)
Subjective:  Patient ID: Nicholas Rasmussen, male    DOB: 1945-09-04  Age: 75 y.o. MRN: [id number]  CC: Acute Visit (Pt c/o knot on neck x 3 days. Red, swollen and painful at times. )  Rash This is a new problem. The problem has been gradually worsening since onset. The affected locations include the neck. The rash is characterized by pain, redness and swelling. He was exposed to an insect bite/sting. Pertinent negatives include no fatigue, fever or shortness of breath. Past treatments include nothing.   Reviewed past Medical, Social and Family history today.  Outpatient Medications Prior to Visit  Medication Sig Dispense Refill  . acetaminophen (TYLENOL) 500 MG tablet Take 1 tablet (500 mg total) by mouth every 8 (eight) hours as needed. 30 tablet 0  . acyclovir cream (ZOVIRAX) 5 % Apply 1 application topically every 6 (six) hours as needed. 5 g 3  . atorvastatin (LIPITOR) 20 MG tablet TAKE 1 TABLET (20 MG TOTAL) BY MOUTH DAILY AT 6 PM. 90 tablet 3  . blood glucose meter kit and supplies KIT Dispense based on patient and insurance preference. Use 2-3x/week (fasting), ICD E11.42 1 each 0  . divalproex (DEPAKOTE ER) 500 MG 24 hr tablet Take 1 tablet (500 mg total) by mouth at bedtime. Please call [phone] for appt or you may speak to your PCP about refills. 90 tablet 4  . gabapentin (NEURONTIN) 300 MG capsule Take 1 capsule (300 mg total) by mouth at bedtime. 90 capsule 4  . glipiZIDE (GLUCOTROL XL) 5 MG 24 hr tablet Take 1 tablet (5 mg total) by mouth daily with breakfast. 90 tablet 3  . Lancets (ONETOUCH DELICA PLUS [id number]) MISC USE AS DIRECTED. TAKE BLOOD SUGAR 2 -3 TIMES PER WEEK BEFORE MEALS. 100 each 3  . metoprolol succinate (TOPROL-XL) 25 MG 24 hr tablet Take 25 mg by mouth daily.    Marland Kitchen omeprazole (PRILOSEC) 20 MG capsule TAKE 1 CAPSULE (20 MG TOTAL) BY MOUTH DAILY AS NEEDED. 90 capsule 1  . ONETOUCH ULTRA test strip USE AS DIRECTED. TEST BLOOD SUGAR 2 - 3 TIMES PER WEEK. BEFORE EATING  25 strip 1  . trimethoprim-polymyxin b (POLYTRIM) ophthalmic solution Place 2 drops into the right eye every 6 (six) hours. 10 mL 0   No facility-administered medications prior to visit.    ROS See HPI  Objective:  BP 120/72 (BP Location: Left Arm, Patient Position: Sitting, Cuff Size: Large)   Pulse 80   Temp (!) 97.1 F (36.2 C) (Temporal)   Ht $R'6\' 4"'Yt$  (1.93 m)   Wt 253 lb 6.4 oz (114.9 kg)   BMI 30.84 kg/m   Physical Exam Vitals reviewed.  Lymphadenopathy:     Cervical: No cervical adenopathy.  Skin:    Findings: Erythema, lesion and rash present. Rash is nodular and pustular.       Neurological:     Mental Status: He is alert and oriented to person, place, and time.    Assessment & Plan:  This visit occurred during the SARS-CoV-2 public health emergency.  Safety protocols were in place, including screening questions prior to the visit, additional usage of staff PPE, and extensive cleaning of exam room while observing appropriate contact time as indicated for disinfecting solutions.   Nicholas Rasmussen was seen today for acute visit.  Diagnoses and all orders for this visit:  Insect bite of neck, initial encounter -     doxycycline (VIBRA-TABS) 100 MG tablet; Take 1 tablet (100 mg total) by mouth 2 (  two) times daily for 7 days.  Carbuncle of neck  Renal mass, left -     Basic metabolic panel  Scalp tenderness -     Sedimentation rate   Problem List Items Addressed This Visit   None   Visit Diagnoses    Insect bite of neck, initial encounter    -  Primary   Relevant Medications   doxycycline (VIBRA-TABS) 100 MG tablet   Carbuncle of neck       Renal mass, left       Scalp tenderness          Follow-up: No follow-ups on file.  Nicholas Lacy, NP

## 2020-07-15 NOTE — Patient Instructions (Signed)
Use warm compress 2-3times a day, 75mins each time. Call office if no improvement in 1week.  Skin Abscess  A skin abscess is an infected area of your skin that contains pus and other material. An abscess can happen in any part of your body. Some abscesses break open (rupture) on their own. Most continue to get worse unless they are treated. The infection can spread deeper into the body and into your blood, which can make you feel sick. A skin abscess is caused by germs that enter the skin through a cut or scrape. It can also be caused by blocked oil and sweat glands or infected hair follicles. This condition is usually treated by:  Draining the pus.  Taking antibiotic medicines.  Placing a warm, wet washcloth over the abscess. Follow these instructions at home: Medicines  Take over-the-counter and prescription medicines only as told by your doctor.  If you were prescribed an antibiotic medicine, take it as told by your doctor. Do not stop taking the antibiotic even if you start to feel better.   Abscess care  If you have an abscess that has not drained, place a warm, clean, wet washcloth over the abscess several times a day. Do this as told by your doctor.  Follow instructions from your doctor about how to take care of your abscess. Make sure you: ? Cover the abscess with a bandage (dressing). ? Change your bandage or gauze as told by your doctor. ? Wash your hands with soap and water before you change the bandage or gauze. If you cannot use soap and water, use hand sanitizer.  Check your abscess every day for signs that the infection is getting worse. Check for: ? More redness, swelling, or pain. ? More fluid or blood. ? Warmth. ? More pus or a bad smell.   General instructions  To avoid spreading the infection: ? Do not share personal care items, towels, or hot tubs with others. ? Avoid making skin-to-skin contact with other people.  Keep all follow-up visits as told by your  doctor. This is important. Contact a doctor if:  You have more redness, swelling, or pain around your abscess.  You have more fluid or blood coming from your abscess.  Your abscess feels warm when you touch it.  You have more pus or a bad smell coming from your abscess.  You have a fever.  Your muscles ache.  You have chills.  You feel sick. Get help right away if:  You have very bad (severe) pain.  You see red streaks on your skin spreading away from the abscess. Summary  A skin abscess is an infected area of your skin that contains pus and other material.  The abscess is caused by germs that enter the skin through a cut or scrape. It can also be caused by blocked oil and sweat glands or infected hair follicles.  Follow your doctor's instructions on caring for your abscess, taking medicines, preventing infections, and keeping follow-up visits. This information is not intended to replace advice given to you by your health care provider. Make sure you discuss any questions you have with your health care provider. Document Revised: 12/14/2018 Document Reviewed: 06/23/2017 Elsevier Patient Education  2021 [employer].

## 2020-07-17 ENCOUNTER — Other Ambulatory Visit: Payer: Self-pay

## 2020-07-18 ENCOUNTER — Ambulatory Visit (INDEPENDENT_AMBULATORY_CARE_PROVIDER_SITE_OTHER): Payer: PPO | Admitting: Nurse Practitioner

## 2020-07-18 ENCOUNTER — Encounter: Payer: Self-pay | Admitting: Nurse Practitioner

## 2020-07-18 VITALS — BP 138/82 | HR 65 | Temp 97.1°F | Wt 255.2 lb

## 2020-07-18 DIAGNOSIS — L0213 Carbuncle of neck: Secondary | ICD-10-CM | POA: Diagnosis not present

## 2020-07-18 DIAGNOSIS — L723 Sebaceous cyst: Secondary | ICD-10-CM | POA: Diagnosis not present

## 2020-07-18 NOTE — Progress Notes (Signed)
Subjective:  Patient ID: Nicholas Rasmussen, male    DOB: 01/08/1946  Age: 75 y.o. MRN: [id number]  CC: Acute Visit (Pt states he has a lump, in middle of the back of neck, pt was seen last Tuesday, it appears to have gotten bigger, pt states at some points the lump will cause pain  but does not keep him from being active pt denies injury to specified location )  HPI Mr. Winker present with worsening neck abscess. He started doxycyline 112m BID on Tuesday and has used warm compresses BID. He denies any fever or neck stiffness.  Reviewed past Medical, Social and Family history today.  Outpatient Medications Prior to Visit  Medication Sig Dispense Refill  . acetaminophen (TYLENOL) 500 MG tablet Take 1 tablet (500 mg total) by mouth every 8 (eight) hours as needed. 30 tablet 0  . acyclovir cream (ZOVIRAX) 5 % Apply 1 application topically every 6 (six) hours as needed. 5 g 3  . atorvastatin (LIPITOR) 20 MG tablet TAKE 1 TABLET (20 MG TOTAL) BY MOUTH DAILY AT 6 PM. 90 tablet 3  . divalproex (DEPAKOTE ER) 500 MG 24 hr tablet Take 1 tablet (500 mg total) by mouth at bedtime. Please call [phone] appt or you may speak to your PCP about refills. 90 tablet 4  . gabapentin (NEURONTIN) 300 MG capsule Take 1 capsule (300 mg total) by mouth at bedtime. 90 capsule 4  . glipiZIDE (GLUCOTROL XL) 5 MG 24 hr tablet Take 1 tablet (5 mg total) by mouth daily with breakfast. 90 tablet 3  . Lancets (ONETOUCH DELICA PLUS [id number] MISC USE AS DIRECTED. TAKE BLOOD SUGAR 2 -3 TIMES PER WEEK BEFORE MEALS. 100 each 3  . metoprolol succinate (TOPROL-XL) 25 MG 24 hr tablet Take 25 mg by mouth daily.    .Marland Kitchenomeprazole (PRILOSEC) 20 MG capsule TAKE 1 CAPSULE (20 MG TOTAL) BY MOUTH DAILY AS NEEDED. 90 capsule 1  . blood glucose meter kit and supplies KIT Dispense based on patient and insurance preference. Use 2-3x/week (fasting), ICD E11.42 (Patient not taking: Reported on 07/18/2020) 1 each 0  . doxycycline (VIBRA-TABS)  100 MG tablet Take 1 tablet (100 mg total) by mouth 2 (two) times daily for 7 days. (Patient not taking: Reported on 07/18/2020) 14 tablet 0  . ONETOUCH ULTRA test strip USE AS DIRECTED. TEST BLOOD SUGAR 2 - 3 TIMES PER WEEK. BEFORE EATING (Patient not taking: Reported on 07/18/2020) 25 strip 1   No facility-administered medications prior to visit.    ROS See HPI  Objective:  BP 138/82 (BP Location: Left Arm, Patient Position: Sitting, Cuff Size: Normal)   Pulse 65   Temp (!) 97.1 F (36.2 C) (Temporal)   Wt 255 lb 3.2 oz (115.8 kg)   SpO2 99%   BMI 31.06 kg/m   Physical Exam Pulmonary:     Effort: Pulmonary effort is normal.  Musculoskeletal:     Cervical back: Normal range of motion and neck supple.  Lymphadenopathy:     Cervical: No cervical adenopathy.  Skin:    Findings: Rash present. Rash is pustular.       Neurological:     Mental Status: He is alert.     Assessment & Plan:  This visit occurred during the SARS-CoV-2 public health emergency.  Safety protocols were in place, including screening questions prior to the visit, additional usage of staff PPE, and extensive cleaning of exam room while observing appropriate contact time as indicated for disinfecting solutions.  Draydon was seen today for acute visit.  Diagnoses and all orders for this visit:  Carbuncle of neck  he has an appt with [facility] today at [place]. [address], [address] Complete doxycycline as prescribed  Problem List Items Addressed This Visit   None   Visit Diagnoses    Carbuncle of neck    -  Primary      Follow-up: No follow-ups on file.  [name], NP

## 2020-07-18 NOTE — Patient Instructions (Signed)
You have an appt at [facility] surgery today at [place]. [address] Complete doxycycline as prescribed

## 2020-07-21 ENCOUNTER — Other Ambulatory Visit: Payer: Self-pay | Admitting: Nurse Practitioner

## 2020-07-21 DIAGNOSIS — K2100 Gastro-esophageal reflux disease with esophagitis, without bleeding: Secondary | ICD-10-CM

## 2020-07-22 ENCOUNTER — Ambulatory Visit
Admission: RE | Admit: 2020-07-22 | Discharge: 2020-07-22 | Disposition: A | Discharge status: Home / Self Care | Payer: PPO | Source: Ambulatory Visit | Attending: Nurse Practitioner | Admitting: Nurse Practitioner

## 2020-07-22 DIAGNOSIS — N2889 Other specified disorders of kidney and ureter: Secondary | ICD-10-CM

## 2020-07-22 DIAGNOSIS — N289 Disorder of kidney and ureter, unspecified: Secondary | ICD-10-CM

## 2020-07-22 DIAGNOSIS — N281 Cyst of kidney, acquired: Secondary | ICD-10-CM | POA: Diagnosis not present

## 2020-07-22 HISTORY — DX: Disorder of kidney and ureter, unspecified: N28.9

## 2020-07-22 MED ORDER — GADOBENATE DIMEGLUMINE 529 MG/ML IV SOLN
20.0000 mL | Freq: Once | INTRAVENOUS | Status: AC | PRN
  Administered 2020-07-22: 20 mL via INTRAVENOUS

## 2020-09-30 ENCOUNTER — Other Ambulatory Visit: Payer: Self-pay | Admitting: Nurse Practitioner

## 2020-09-30 DIAGNOSIS — E1142 Type 2 diabetes mellitus with diabetic polyneuropathy: Secondary | ICD-10-CM

## 2020-10-04 ENCOUNTER — Other Ambulatory Visit: Payer: Self-pay | Admitting: Interventional Cardiology

## 2020-10-04 DIAGNOSIS — I25119 Atherosclerotic heart disease of native coronary artery with unspecified angina pectoris: Secondary | ICD-10-CM

## 2020-10-06 ENCOUNTER — Telehealth: Payer: Self-pay | Admitting: *Deleted

## 2020-10-06 NOTE — Telephone Encounter (Signed)
[facility] is [facility] a refill on patient's metoprolol succinate (Toprol-XL) 12.5 mg. Per Dr. Eskelson Caul epic note on 10/23/19 states "if patient have aneurysm growth, may discontinue beta-blocker therapy."  Please advise. Thank you

## 2020-10-06 NOTE — Telephone Encounter (Signed)
Ok to fill 

## 2020-10-06 NOTE — Telephone Encounter (Signed)
Pt's medication was sent to pt's pharmacy as requested. Confirmation received.  °

## 2020-10-09 ENCOUNTER — Telehealth: Payer: Self-pay | Admitting: Nurse Practitioner

## 2020-10-09 ENCOUNTER — Encounter: Payer: Self-pay | Admitting: Nurse Practitioner

## 2020-10-09 ENCOUNTER — Other Ambulatory Visit: Payer: Self-pay

## 2020-10-09 ENCOUNTER — Ambulatory Visit (INDEPENDENT_AMBULATORY_CARE_PROVIDER_SITE_OTHER): Payer: PPO | Admitting: Nurse Practitioner

## 2020-10-09 VITALS — BP 110/60 | HR 60 | Temp 97.0°F | Wt 243.0 lb

## 2020-10-09 DIAGNOSIS — K2100 Gastro-esophageal reflux disease with esophagitis, without bleeding: Secondary | ICD-10-CM | POA: Diagnosis not present

## 2020-10-09 DIAGNOSIS — I83813 Varicose veins of bilateral lower extremities with pain: Secondary | ICD-10-CM

## 2020-10-09 NOTE — Progress Notes (Signed)
Subjective:  Patient ID: Nicholas Rasmussen, male    DOB: Jun 14, 1945  Age: 75 y.o. MRN: [id number]  CC: Acute Visit (Pt c/o acid reflux /Pt c/o bilateral leg pain right leg worse than left, denies any falls or known injury. )  HPI  GERD (gastroesophageal reflux disease) Increase bulging, use of omeprazole in AM prn No weight loss, no ABD pain, no dysphagia, no melena, no SOB or CP.  Increase omeprazole 53m BID x 1week, then once a day Follow GERD diet  Varicose veins of both lower extremities with pain Chronic, stable Continue normal activity, use gabapentin as prescribed, use compression stocking if prolonged standing or sitting  Wt Readings from Last 3 Encounters:  10/09/20 243 lb (110.2 kg)  07/18/20 255 lb 3.2 oz (115.8 kg)  07/15/20 253 lb 6.4 oz (114.9 kg)   Reviewed past Medical, Social and Family history today.  Outpatient Medications Prior to Visit  Medication Sig Dispense Refill  . acetaminophen (TYLENOL) 500 MG tablet Take 1 tablet (500 mg total) by mouth every 8 (eight) hours as needed. 30 tablet 0  . acyclovir cream (ZOVIRAX) 5 % Apply 1 application topically every 6 (six) hours as needed. 5 g 3  . atorvastatin (LIPITOR) 20 MG tablet TAKE 1 TABLET (20 MG TOTAL) BY MOUTH DAILY AT 6 PM. 90 tablet 3  . divalproex (DEPAKOTE ER) 500 MG 24 hr tablet Take 1 tablet (500 mg total) by mouth at bedtime. Please call [phone] appt or you may speak to your PCP about refills. 90 tablet 4  . gabapentin (NEURONTIN) 300 MG capsule Take 1 capsule (300 mg total) by mouth at bedtime. 90 capsule 4  . glipiZIDE (GLUCOTROL XL) 5 MG 24 hr tablet TAKE 1 TABLET BY MOUTH EVERY DAY WITH BREAKFAST 90 tablet 3  . Lancets (ONETOUCH DELICA PLUS [id number] MISC USE AS DIRECTED. TAKE BLOOD SUGAR 2 -3 TIMES PER WEEK BEFORE MEALS. 100 each 3  . metoprolol succinate (TOPROL-XL) 25 MG 24 hr tablet Take 1 tablet (25 mg total) by mouth daily. Please keep upcoming appointment in August 2022 for future  refills. Thank you 90 tablet 1  . omeprazole (PRILOSEC) 20 MG capsule TAKE 1 CAPSULE (20 MG TOTAL) BY MOUTH DAILY AS NEEDED. 90 capsule 1  . blood glucose meter kit and supplies KIT Dispense based on patient and insurance preference. Use 2-3x/week (fasting), ICD E11.42 (Patient not taking: No sig reported) 1 each 0  . ONETOUCH ULTRA test strip USE AS DIRECTED. TEST BLOOD SUGAR 2 - 3 TIMES PER WEEK. BEFORE EATING (Patient not taking: No sig reported) 25 strip 1   No facility-administered medications prior to visit.    ROS See HPI  Objective:  BP 110/60 (BP Location: Left Arm, Patient Position: Sitting, Cuff Size: Large)   Pulse 60   Temp (!) 97 F (36.1 C) (Temporal)   Wt 243 lb (110.2 kg)   SpO2 99%   BMI 29.58 kg/m   Physical Exam Vitals reviewed.  Cardiovascular:     Rate and Rhythm: Normal rate.     Pulses: Normal pulses.  Pulmonary:     Effort: Pulmonary effort is normal.  Abdominal:     General: There is no distension.     Palpations: Abdomen is soft.     Tenderness: There is no abdominal tenderness.  Musculoskeletal:        General: No swelling or tenderness.     Right lower leg: No edema.     Left lower leg: No  edema.  Skin:    General: Skin is warm and dry.     Findings: No erythema or rash.  Neurological:     Mental Status: He is alert and oriented to person, place, and time.    Assessment & Plan:  This visit occurred during the SARS-CoV-2 public health emergency.  Safety protocols were in place, including screening questions prior to the visit, additional usage of staff PPE, and extensive cleaning of exam room while observing appropriate contact time as indicated for disinfecting solutions.   Remus was seen today for acute visit.  Diagnoses and all orders for this visit:  Gastroesophageal reflux disease with esophagitis without hemorrhage  Varicose veins of both lower extremities with pain   Problem List Items Addressed This Visit      Cardiovascular  and Mediastinum   Varicose veins of both lower extremities with pain    Chronic, stable Continue normal activity, use gabapentin as prescribed, use compression stocking if prolonged standing or sitting        Digestive   GERD (gastroesophageal reflux disease) - Primary    Increase bulging, use of omeprazole in AM prn No weight loss, no ABD pain, no dysphagia, no melena, no SOB or CP.  Increase omeprazole 20mg BID x 1week, then once a day Follow GERD diet         Follow-up: No follow-ups on file.  Charlotte Nche, NP 

## 2020-10-09 NOTE — Assessment & Plan Note (Addendum)
Increase bulging, use of omeprazole in AM prn No change in appetite, no ABD pain, no dysphagia, no melena, no SOB or CP. No night sweats. Decrease weight noted: he reports it is due to increase activity (dancing competition) Last EGD 2019: chronic gastritis No copy of last colonoscopy in 2015. Increase omeprazole 20mg  BID x 1week, then once a day Follow GERD diet If no improvement, schedule appt with GI

## 2020-10-09 NOTE — Assessment & Plan Note (Addendum)
Chronic, stable Continue normal activity, use gabapentin as prescribed, use compression stocking if prolonged standing or sitting

## 2020-10-09 NOTE — Patient Instructions (Signed)
Ok to use [employer] for constipation (1Tbsp in glass of water daily).  Take omeprazole BID x 1week then decrease to 1tab daily in AM.  Continue gabapentin for leg pain and neuropathy May also use tylenol OTC as needed

## 2020-10-13 NOTE — Telephone Encounter (Signed)
See note below

## 2020-10-28 ENCOUNTER — Telehealth: Payer: Self-pay

## 2020-10-28 DIAGNOSIS — Z01810 Encounter for preprocedural cardiovascular examination: Secondary | ICD-10-CM

## 2020-10-28 NOTE — Telephone Encounter (Signed)
Bmet order placed for CT.

## 2020-10-28 NOTE — Telephone Encounter (Signed)
-----   Message from Livingston Diones, [place] sent at 10/28/2020  8:35 AM EDT ----- Regarding: BMET Needed Hi,  Can you guys please place an order for a BMET on this patient?   Thanks! Nunzio Cobbs

## 2020-10-31 ENCOUNTER — Telehealth: Payer: Self-pay | Admitting: Nurse Practitioner

## 2020-10-31 NOTE — Telephone Encounter (Signed)
Pt called and wanted to know if he can have meclizine filled. Please advise

## 2020-11-03 ENCOUNTER — Other Ambulatory Visit: Payer: Self-pay

## 2020-11-04 ENCOUNTER — Encounter: Payer: Self-pay | Admitting: Nurse Practitioner

## 2020-11-04 ENCOUNTER — Ambulatory Visit (INDEPENDENT_AMBULATORY_CARE_PROVIDER_SITE_OTHER): Payer: PPO | Admitting: Nurse Practitioner

## 2020-11-04 VITALS — BP 118/62 | HR 51 | Temp 98.3°F | Ht 76.0 in | Wt 246.6 lb

## 2020-11-04 DIAGNOSIS — H8111 Benign paroxysmal vertigo, right ear: Secondary | ICD-10-CM | POA: Diagnosis not present

## 2020-11-04 MED ORDER — MECLIZINE HCL 12.5 MG PO TABS: 12.5000 mg | ORAL_TABLET | Freq: Three times a day (TID) | ORAL | 0 refills | Status: DC | PRN

## 2020-11-04 NOTE — Telephone Encounter (Signed)
Mr. Lagrange was seen in the office today by [name] NP.

## 2020-11-04 NOTE — Patient Instructions (Signed)
Start vertigo exercise daily x 1week  Vertigo Vertigo is the feeling that you or your surroundings are moving when they are not. This feeling can come and go at any time. Vertigo often goes away on its own. Vertigo can be dangerous if it occurs while you are doing something thatcould endanger yourself or others, such as driving or operating machinery. Your health care provider will do tests to try to determine the cause of your vertigo. Tests will also help your health care provider decide how best totreat your condition. Follow these instructions at home: Eating and drinking     Dehydration can make vertigo worse. Drink enough fluid to keep your urine pale yellow. Do not drink alcohol. Activity Return to your normal activities as told by your health care provider. Ask your health care provider what activities are safe for you. In the morning, first sit up on the side of the bed. When you feel okay, stand slowly while you hold onto something until you know that your balance is fine. Move slowly. Avoid sudden body or head movements or certain positions, as told by your health care provider. If you have trouble walking or keeping your balance, try using a cane for stability. If you feel dizzy or unstable, sit down right away. Avoid doing any tasks that would cause danger to you or others if vertigo occurs. Avoid bending down if you feel dizzy. Place items in your home so that they are easy for you to reach without bending or leaning over. Do not drive or use machinery if you feel dizzy. General instructions Take over-the-counter and prescription medicines only as told by your health care provider. Keep all follow-up visits. This is important. Contact a health care provider if: Your medicines do not relieve your vertigo or they make it worse. Your condition gets worse or you develop new symptoms. You have a fever. You develop nausea or vomiting, or if nausea gets worse. Your family or  friends notice any behavioral changes. You have numbness or a prickling and tingling sensation in part of your body. Get help right away if you: Are always dizzy or you faint. Develop severe headaches. Develop a stiff neck. Develop sensitivity to light. Have difficulty moving or speaking. Have weakness in your hands, arms, or legs. Have changes in your hearing or vision. These symptoms may represent a serious problem that is an emergency. Do not wait to see if the symptoms will go away. Get medical help right away. Call your local emergency services (911 in the [place] Do not drive yourself to the hospital. Summary Vertigo is the feeling that you or your surroundings are moving when they are not. Your health care provider will do tests to try to determine the cause of your vertigo. Follow instructions for home care. You may be told to avoid certain tasks, positions, or movements. Contact a health care provider if your medicines do not relieve your symptoms, or if you have a fever, nausea, vomiting, or changes in behavior. Get help right away if you have severe headaches or difficulty speaking, or you develop hearing or vision problems. This information is not intended to replace advice given to you by your health care provider. Make sure you discuss any questions you have with your healthcare provider. Document Revised: 04/09/2020 Document Reviewed: 04/09/2020 Elsevier Patient Education  2022 [employer].

## 2020-11-04 NOTE — Progress Notes (Addendum)
Subjective:  Patient ID: Nicholas Rasmussen, male    DOB: [birth year]  Age: 75 y.o. MRN: [id number]  CC: Dizziness   Dizziness This is a recurrent problem. The current episode started more than 1 year ago. The problem occurs intermittently. The problem has been resolved. Associated symptoms include vertigo. Pertinent negatives include no abdominal pain, anorexia, arthralgias, change in bowel habit, chest pain, chills, congestion, coughing, diaphoresis, fatigue, fever, headaches, joint swelling, myalgias, nausea, neck pain, numbness, rash, sore throat, swollen glands, urinary symptoms, visual change, vomiting or weakness. The symptoms are aggravated by twisting. He has tried rest for the symptoms. The treatment provided significant relief.  Current episode lasted <38mins, onset in AM while trying to get out of bed. Had similar symptoms once a year Denies any new medication and/or dose He was able to go dancing after incident without any exacerbation Denies any head injury  Reviewed past Medical, Social and Family history today.  Outpatient Medications Prior to Visit  Medication Sig Dispense Refill   acetaminophen (TYLENOL) 500 MG tablet Take 1 tablet (500 mg total) by mouth every 8 (eight) hours as needed. 30 tablet 0   acyclovir cream (ZOVIRAX) 5 % Apply 1 application topically every 6 (six) hours as needed. 5 g 3   atorvastatin (LIPITOR) 20 MG tablet TAKE 1 TABLET (20 MG TOTAL) BY MOUTH DAILY AT 6 PM. 90 tablet 3   divalproex (DEPAKOTE ER) 500 MG 24 hr tablet Take 1 tablet (500 mg total) by mouth at bedtime. Please call [phone] for appt or you may speak to your PCP about refills. 90 tablet 4   gabapentin (NEURONTIN) 300 MG capsule Take 1 capsule (300 mg total) by mouth at bedtime. 90 capsule 4   glipiZIDE (GLUCOTROL XL) 5 MG 24 hr tablet TAKE 1 TABLET BY MOUTH EVERY DAY WITH BREAKFAST 90 tablet 3   Lancets (ONETOUCH DELICA PLUS [id number]) MISC USE AS DIRECTED. TAKE BLOOD SUGAR 2 -3 TIMES PER  WEEK BEFORE MEALS. 100 each 3   metoprolol succinate (TOPROL-XL) 25 MG 24 hr tablet Take 1 tablet (25 mg total) by mouth daily. Please keep upcoming appointment in August 2022 for future refills. Thank you 90 tablet 1   omeprazole (PRILOSEC) 20 MG capsule TAKE 1 CAPSULE (20 MG TOTAL) BY MOUTH DAILY AS NEEDED. 90 capsule 1   No facility-administered medications prior to visit.    ROS See HPI  Objective:  BP 118/62   Pulse (!) 51   Temp 98.3 F (36.8 C)   Ht 6\' 4"  (1.93 m)   Wt 246 lb 9.6 oz (111.9 kg)   SpO2 95%   BMI 30.02 kg/m   Physical Exam HENT:     Right Ear: Tympanic membrane, ear canal and external ear normal.     Left Ear: Tympanic membrane, ear canal and external ear normal.  Eyes:     Extraocular Movements: Extraocular movements intact.     Conjunctiva/sclera: Conjunctivae normal.  Cardiovascular:     Rate and Rhythm: Normal rate.     Pulses: Normal pulses.  Pulmonary:     Effort: Pulmonary effort is normal.  Musculoskeletal:     Cervical back: Normal range of motion and neck supple.  Neurological:     Mental Status: He is alert and oriented to person, place, and time.     Cranial Nerves: No cranial nerve deficit.  Psychiatric:        Mood and Affect: Mood normal.        Behavior: Behavior normal.  Thought Content: Thought content normal.    Assessment & Plan:  This visit occurred during the SARS-CoV-2 public health emergency.  Safety protocols were in place, including screening questions prior to the visit, additional usage of staff PPE, and extensive cleaning of exam room while observing appropriate contact time as indicated for disinfecting solutions.   Nicholas Rasmussen was seen today for dizziness.  Diagnoses and all orders for this visit:  [name] -     meclizine (ANTIVERT) 12.5 MG tablet; Take 1 tablet (12.5 mg total) by mouth 3 (three) times daily as needed for dizziness (do not use for more than  3days).  Provided a copy of vertigo exercises. He is to call office if increase frequency and/or new symptoms.  Problem List Items Addressed This Visit       Other   Dizziness - Primary   Relevant Medications   meclizine (ANTIVERT) 12.5 MG tablet    Follow-up: No follow-ups on file.  [name], NP

## 2020-11-15 ENCOUNTER — Other Ambulatory Visit: Payer: Self-pay | Admitting: Interventional Cardiology

## 2020-11-17 NOTE — Telephone Encounter (Signed)
Rx(s) sent to pharmacy electronically.  

## 2020-11-18 ENCOUNTER — Other Ambulatory Visit: Payer: PPO | Admitting: *Deleted

## 2020-11-18 ENCOUNTER — Other Ambulatory Visit: Payer: Self-pay

## 2020-11-18 DIAGNOSIS — Z01810 Encounter for preprocedural cardiovascular examination: Secondary | ICD-10-CM | POA: Diagnosis not present

## 2020-11-18 LAB — BASIC METABOLIC PANEL
BUN/Creatinine Ratio: 17 (ref 10–24)
BUN: 20 mg/dL (ref 8–27)
CO2: 28 mmol/L (ref 20–29)
Calcium: 9.6 mg/dL (ref 8.6–10.2)
Chloride: 103 mmol/L (ref 96–106)
Creatinine, Ser: 1.18 mg/dL (ref 0.76–1.27)
Glucose: 102 mg/dL — ABNORMAL HIGH (ref 65–99)
Potassium: 4.2 mmol/L (ref 3.5–5.2)
Sodium: 140 mmol/L (ref 134–144)
eGFR: 66 mL/min/{1.73_m2} (ref >59)

## 2020-11-25 ENCOUNTER — Other Ambulatory Visit: Payer: Self-pay

## 2020-11-25 ENCOUNTER — Ambulatory Visit (INDEPENDENT_AMBULATORY_CARE_PROVIDER_SITE_OTHER)
Admission: RE | Admit: 2020-11-25 | Discharge: 2020-11-25 | Disposition: A | Discharge status: Home / Self Care | Payer: PPO | Source: Ambulatory Visit | Attending: Interventional Cardiology | Admitting: Interventional Cardiology

## 2020-11-25 DIAGNOSIS — R911 Solitary pulmonary nodule: Secondary | ICD-10-CM | POA: Diagnosis not present

## 2020-11-25 DIAGNOSIS — I77810 Thoracic aortic ectasia: Secondary | ICD-10-CM

## 2020-11-25 DIAGNOSIS — I517 Cardiomegaly: Secondary | ICD-10-CM | POA: Diagnosis not present

## 2020-11-25 MED ORDER — IOHEXOL 350 MG/ML SOLN
100.0000 mL | Freq: Once | INTRAVENOUS | Status: AC | PRN
  Administered 2020-11-25: 100 mL via INTRAVENOUS

## 2020-12-15 ENCOUNTER — Other Ambulatory Visit: Payer: Self-pay | Admitting: Interventional Cardiology

## 2020-12-16 ENCOUNTER — Telehealth: Payer: Self-pay | Admitting: Interventional Cardiology

## 2020-12-16 NOTE — Telephone Encounter (Signed)
90 day refill for At[facility]atin [facility]17[facility]

## 2020-12-16 NOTE — Telephone Encounter (Signed)
*  STAT* If patient is at the pharmacy, call can be transferred to refill team.   1. Which medications need to be refilled? (please list name of each medication and dose if known)  atorvastatin (LIPITOR) 20 MG tablet   2. Which pharmacy/location (including street and city if local pharmacy) is medication to be sent to? [id number] [place] [place] [facility] [address]  3. Do they need a 30 day or 90 day supply?  90 day supply

## 2020-12-17 ENCOUNTER — Other Ambulatory Visit: Payer: Self-pay

## 2020-12-26 NOTE — Progress Notes (Signed)
Cardiology Office Note:    Date:  12/29/2020   ID:  Nicholas Rasmussen, DOB 1945/09/08, MRN [id number]  PCP:  Flossie Buffy, NP  Cardiologist:  Sinclair Grooms, MD   Referring MD: Flossie Buffy, NP   Chief Complaint  Patient presents with   Thoracic Aortic Aneurysm     History of Present Illness:    Nicholas Rasmussen is a 75 y.o. male with a hx of vague chest discomfort and moderate risk coronary artery calcification score 156. Coincidentally found ascending aortic aneurysm, 44 mm.  He is doing okay.  He denies angina.  He does ballroom dancing.  No difficulty with breathing.  No syncopal episodes.  Past Medical History:  Diagnosis Date   Allergic rhinitis 05/07/2014   Dizziness 01/02/2016   GERD (gastroesophageal reflux disease)    Headache 03/13/2015   Left flank pain 01/02/2016   Low back pain 06/06/2015   Pain of left side of body 08/13/2015   Post herpetic neuralgia    Routine general medical examination at a health care facility 05/07/2014   Seizure disorder ([place]) 05/07/2014   Seizures ([place])    Last seizure in the 70s.   Shingles    Stye 05/07/2014    Past Surgical History:  Procedure Laterality Date   COLONOSCOPY     KNEE SURGERY Left    left miniscus   UPPER GASTROINTESTINAL ENDOSCOPY     uvala surgery      Current Medications: Current Meds  Medication Sig   acetaminophen (TYLENOL) 500 MG tablet Take 1 tablet (500 mg total) by mouth every 8 (eight) hours as needed.   acyclovir cream (ZOVIRAX) 5 % Apply 1 application topically every 6 (six) hours as needed.   atorvastatin (LIPITOR) 20 MG tablet TAKE 1 TABLET (20 MG TOTAL) BY MOUTH DAILY AT 6 PM.   divalproex (DEPAKOTE ER) 500 MG 24 hr tablet Take 1 tablet (500 mg total) by mouth at bedtime. Please call [phone] for appt or you may speak to your PCP about refills.   gabapentin (NEURONTIN) 300 MG capsule Take 1 capsule (300 mg total) by mouth at bedtime.   glipiZIDE (GLUCOTROL XL) 5 MG 24 hr tablet  TAKE 1 TABLET BY MOUTH EVERY DAY WITH BREAKFAST   Lancets (ONETOUCH DELICA PLUS 123XX123) MISC USE AS DIRECTED. TAKE BLOOD SUGAR 2 -3 TIMES PER WEEK BEFORE MEALS.   meclizine (ANTIVERT) 12.5 MG tablet Take 1 tablet (12.5 mg total) by mouth 3 (three) times daily as needed for dizziness (do not use for more than 3days).   metoprolol succinate (TOPROL-XL) 25 MG 24 hr tablet Take 1 tablet (25 mg total) by mouth daily. Please keep upcoming appointment in August 2022 for future refills. Thank you   omeprazole (PRILOSEC) 20 MG capsule TAKE 1 CAPSULE (20 MG TOTAL) BY MOUTH DAILY AS NEEDED.     Allergies:   Patient has no known allergies.   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   Right-handed.   Rarely drinks caffeine.   Social Determinants of Health   Financial Resource Strain: Low Risk    Difficulty of Paying Living Expenses: Not hard at all  Food Insecurity: No Food Insecurity   Worried About Charity fundraiser in the Last Year: Never true   [place] in the Last Year: Never true  Transportation Needs: No Transportation Needs   Lack of Transportation (Medical): No   Lack of Transportation (Non-Medical): No  Physical Activity: Sufficiently Active   Days of Exercise per Week: 3 days   Minutes of  Exercise per Session: 60 min  Stress: No Stress Concern Present   Feeling of Stress : Not at all  Social Connections: Moderately Integrated   Frequency of Communication with Friends and Family: More than three times a week   Frequency of Social Gatherings with Friends and Family: More than three times a week   Attends Religious Services: More than 4 times per year   Active Member of [employer] or Organizations: Yes   Attends Archivist Meetings: Not on file   Marital Status: Divorced     Family History: The patient's family history includes Diabetes in his father; Hypertension in his father; Other in his mother.  ROS:   Please see the history of present illness.    Heartburn for which he takes omeprazole.  This helps the heartburn.  It occurs mostly after eating.  It is never precipitated by physical activity.  All other systems reviewed and are negative.  EKGs/Labs/Other Studies Reviewed:    The following studies were reviewed today: CT Chest 11/25/2020:  IMPRESSION: 1. Stable uncomplicated mild fusiform ectasia of the aortic root measuring 44 mm in diameter, unchanged compared to the 05/2017 examination. 2. Coronary calcifications.  Aortic Atherosclerosis (ICD10-I70.0). 3. Punctate (sub 0.5 cm) pulmonary nodules are unchanged compared to the 11/2019 examination. This examination documents 1 year of stability. Follow-up examination in 11/2021 would ensure 2 years of stability and thus a benign etiology. 4.  Emphysema (ICD10-J43.9).  EKG:  EKG sinus bradycardia rhythm with normal PR interval and left axis deviation.  Recent Labs: 11/18/2020: BUN 20; Creatinine, Ser 1.18; Potassium 4.2; Sodium 140  Recent Lipid Panel    Component Value Date/Time   CHOL 115 04/09/2020 1018   CHOL 123 08/04/2017 0904   TRIG 60.0 04/09/2020 1018   HDL 43.50 04/09/2020 1018   HDL 47 08/04/2017 0904   CHOLHDL 3 04/09/2020 1018   VLDL 12.0 04/09/2020 1018   LDLCALC 59 04/09/2020 1018    LDLCALC 65 08/04/2017 0904    Physical Exam:    VS:  BP 120/68   Pulse (!) 49   Ht '6\' 4"'$  (1.93 m)   Wt 243 lb 3.2 oz (110.3 kg)   SpO2 96%   BMI 29.60 kg/m     Wt Readings from Last 3 Encounters:  12/29/20 243 lb 3.2 oz (110.3 kg)  11/04/20 246 lb 9.6 oz (111.9 kg)  10/09/20 243 lb (110.2 kg)     GEN: Healthy-appearing. No acute distress HEENT: Normal NECK: No JVD. LYMPHATICS: No lymphadenopathy CARDIAC: No murmur. RRR no gallop, or edema. VASCULAR:  Normal Pulses. No bruits. RESPIRATORY:  Clear to auscultation  without rales, wheezing or rhonchi  ABDOMEN: Soft, non-tender, non-distended, No pulsatile mass, MUSCULOSKELETAL: No deformity  SKIN: Warm and dry NEUROLOGIC:  Alert and oriented x 3 PSYCHIATRIC:  Normal affect   ASSESSMENT:    1. Aortic root dilatation (HCC)   2. Coronary artery disease involving native coronary artery of native heart with angina pectoris ([place])   3. Hyperlipidemia with target LDL less than 70   4. Type 2 diabetes mellitus without complication, without long-term current use of insulin (HCC)    PLAN:    In order of problems listed above:  Stable aortic root.  Primary care is decrease Toprol-XL to 12.5 mg/day although the patient was asymptomatic on 25 mg/day.  Vital signs look okay patient feels okay and I will therefore leave the dose where it is. Primary prevention with exercise, lipid management, and control of diabetes.  A1c was 6.1.  Glucotrol is being used.  Consider SGLT2 therapy. Continue current therapy with atorvastatin 20 mg/day.  LDL is less than 60. Consider SGLT2 therapy   Medication Adjustments/Labs and Tests Ordered: Current medicines are reviewed at length with the patient today.  Concerns regarding medicines are outlined above.  Orders Placed This Encounter  Procedures   EKG 12-Lead   No orders of the defined types were placed in this encounter.   There are no Patient Instructions on file for this visit.    Signed, Sinclair Grooms, MD  12/29/2020 2:37 PM    [facility]

## 2020-12-29 ENCOUNTER — Other Ambulatory Visit: Payer: Self-pay

## 2020-12-29 ENCOUNTER — Ambulatory Visit: Payer: PPO | Admitting: Interventional Cardiology

## 2020-12-29 ENCOUNTER — Encounter: Payer: Self-pay | Admitting: Interventional Cardiology

## 2020-12-29 VITALS — BP 120/68 | HR 49 | Ht 76.0 in | Wt 243.2 lb

## 2020-12-29 DIAGNOSIS — E785 Hyperlipidemia, unspecified: Secondary | ICD-10-CM

## 2020-12-29 DIAGNOSIS — I77810 Thoracic aortic ectasia: Secondary | ICD-10-CM | POA: Diagnosis not present

## 2020-12-29 DIAGNOSIS — E119 Type 2 diabetes mellitus without complications: Secondary | ICD-10-CM

## 2020-12-29 DIAGNOSIS — I25119 Atherosclerotic heart disease of native coronary artery with unspecified angina pectoris: Secondary | ICD-10-CM | POA: Diagnosis not present

## 2020-12-29 NOTE — Patient Instructions (Signed)

## 2020-12-30 ENCOUNTER — Telehealth: Payer: Self-pay | Admitting: Nurse Practitioner

## 2020-12-30 NOTE — Telephone Encounter (Signed)
Contact Mr. Rede to schedule appt with me. Need to repeat labs and discuss change in DM medication. Thank you

## 2020-12-31 NOTE — Telephone Encounter (Signed)
appt scheduled 01/01/21.  Dm/cma

## 2021-01-01 ENCOUNTER — Other Ambulatory Visit: Payer: Self-pay

## 2021-01-01 ENCOUNTER — Encounter: Payer: Self-pay | Admitting: Nurse Practitioner

## 2021-01-01 ENCOUNTER — Ambulatory Visit (INDEPENDENT_AMBULATORY_CARE_PROVIDER_SITE_OTHER): Payer: PPO | Admitting: Nurse Practitioner

## 2021-01-01 VITALS — BP 112/60 | HR 50 | Temp 98.2°F | Ht 76.0 in | Wt 243.2 lb

## 2021-01-01 DIAGNOSIS — I25119 Atherosclerotic heart disease of native coronary artery with unspecified angina pectoris: Secondary | ICD-10-CM

## 2021-01-01 DIAGNOSIS — E1142 Type 2 diabetes mellitus with diabetic polyneuropathy: Secondary | ICD-10-CM

## 2021-01-01 MED ORDER — EMPAGLIFLOZIN 10 MG PO TABS: 10.0000 mg | ORAL_TABLET | Freq: Every day | ORAL | 5 refills | Status: DC

## 2021-01-01 NOTE — Patient Instructions (Signed)
D/c glipizide and start jardiance Go to lab in 8monthfor repeat BMP.  Empagliflozin Oral Tablets What is this medication? EMPAGLIFLOZIN (EM pa gli FLOE zin) helps to treat type 2 diabetes. It helps to control blood sugar. Treatment is combined with diet and exercise. This drug may also reduce the risk of heart attack, stroke, or death if you have type 2 diabetes and risk factors for heart disease. It also treats heart failure. Itmay lower the risk for treatment of heart failure in the hospital. This medicine may be used for other purposes; ask your health care provider orpharmacist if you have questions. COMMON BRAND NAME(S): Jardiance What should I tell my care team before I take this medication? They need to know if you have any of these conditions: dehydration diabetic ketoacidosis diet low in salt eating less due to illness, surgery, dieting, or any other reason having surgery high cholesterol high levels of potassium in the blood history of pancreatitis or pancreas problems history of yeast infection of the penis or vagina if you often drink alcohol infections in the bladder, kidneys, or urinary tract kidney disease liver disease low blood pressure on hemodialysis problems urinating type 1 diabetes uncircumcised male an unusual or allergic reaction to empagliflozin, other medicines, foods, dyes, or preservatives pregnant or trying to get pregnant breast-feeding How should I use this medication? Take this medicine by mouth with water. Take it as directed on the prescription label at the same time every day. You may take it with or without food. Keeptaking it unless your health care provider tells you to stop. A special MedGuide will be given to you by the pharmacist with eachprescription and refill. Be sure to read this information carefully each time. Talk to your health care provider about the use of this medicine in children.Special care may be needed. Overdosage: If you  think you have taken too much of this medicine contact apoison control center or emergency room at once. NOTE: This medicine is only for you. Do not share this medicine with others. What if I miss a dose? If you miss a dose, take it as soon as you can. If it is almost time for yournext dose, take only that dose. Do not take double or extra doses. What may interact with this medication? alcohol diuretics insulin This list may not describe all possible interactions. Give your health care provider a list of all the medicines, herbs, non-prescription drugs, or dietary supplements you use. Also tell them if you smoke, drink alcohol, or use illegaldrugs. Some items may interact with your medicine. What should I watch for while using this medication? Visit your health care provider for regular checks on your progress. Tell your health care provider if your symptoms do not start to get better or if they getworse. This medicine can cause a serious condition in which there is too much acid in the blood. If you develop nausea, vomiting, stomach pain, unusual tiredness, or breathing problems, stop taking this medicine and call your doctor right away.If possible, use a ketone dipstick to check for ketones in your urine. Check with your health care provider if you have severe diarrhea, nausea, and vomiting, or if you sweat a lot. The loss of too much body fluid may make itdangerous for you to take this medicine. A test called the HbA1C (A1C) will be monitored. This is a simple blood test. It measures your blood sugar control over the last 2 to 3 months. You willreceive this test every 3  to 6 months. Learn how to check your blood sugar. Learn the symptoms of low and high bloodsugar and how to manage them. Always carry a quick-source of sugar with you in case you have symptoms of low blood sugar. Examples include hard sugar candy or glucose tablets. Make sure others know that you can choke if you eat or drink when  you develop serious symptoms of low blood sugar, such as seizures or unconsciousness. Get medicalhelp at once. Tell your health care provider if you have high blood sugar. You might need to change the dose of your medicine. If you are sick or exercising more thanusual, you may need to change the dose of your medicine. What side effects may I notice from receiving this medication? Side effects that you should report to your doctor or health care professionalas soon as possible: allergic reactions (skin rash, itching or hives, swelling of the face, lips, or tongue) breathing problems dizziness feeling faint or lightheaded, falls genital infection (fever; tenderness, redness, or swelling in the genitals or area from the genitals to the back of the rectum) kidney injury (trouble passing urine or change in the amount of urine) low blood sugar (feeling anxious; confusion; dizziness; increased hunger; unusually weak or tired; increased sweating; shakiness; cold, clammy skin; irritable; headache; blurred vision; fast heartbeat; loss of consciousness) muscle weakness nausea, vomiting, unusual stomach upset or pain new pain or tenderness, change in skin color, sores or ulcers, or infection in legs or feet penile discharge, itching, or pain unusual tiredness unusual vaginal discharge, itching, or odor urinary tract infection (fever; chills; a burning feeling when urinating; urgent need to urinate more often; blood in the urine; back pain) Side effects that usually do not require medical attention (report to yourdoctor or health care professional if they continue or are bothersome): mild increase in urination thirsty This list may not describe all possible side effects. Call your doctor for medical advice about side effects. You may report side effects to FDA at1-800-FDA-1088. Where should I keep my medication? Keep out of the reach of children and pets. Store at room temperature between 20 and 25  degrees C (68 and 77 degrees F).Get rid of any unused medicine after the expiration date. To get rid of medicines that are no longer needed or have expired: Take the medicine to a medicine take-back program. Check with your pharmacy or law enforcement to find a location. If you cannot return the medicine, check the label or package insert to see if the medicine should be thrown out in the garbage or flushed down the toilet. If you are not sure, ask your health care provider. If it is safe to put it in the trash, take the medicine out of the container. Mix the medicine with cat litter, dirt, coffee grounds, or other unwanted substance. Seal the mixture in a bag or container. Put it in the trash. NOTE: This sheet is a summary. It may not cover all possible information. If you have questions about this medicine, talk to your doctor, pharmacist, orhealth care provider.  2022 Elsevier/Gold Standard (2020-01-11 19:51:17)

## 2021-01-01 NOTE — Assessment & Plan Note (Addendum)
Current hgbA1c of 6.1% Known CAD LDL at goal with statin. BMP Latest Ref Rng & Units 11/18/2020 07/15/2020 11/13/2019  Glucose 65 - 99 mg/dL 102(H) 106(H) 99  BUN 8 - 27 mg/dL '20 13 13  '$ Creatinine 0.76 - 1.27 mg/dL 1.18 1.08 1.03  BUN/Creat Ratio 10 - 24 17 - 13  Sodium 134 - 144 mmol/L 140 141 143  Potassium 3.5 - 5.2 mmol/L 4.2 4.1 4.1  Chloride 96 - 106 mmol/L 103 102 105  CO2 20 - 29 mmol/L 28 33(H) 28  Calcium 8.6 - 10.2 mg/dL 9.6 8.9 9.0   We discussed the benefits of jardiance compared to glipizide in the management DM. We also discussed possible side effects of jardiance. Advised about the importance of adequate oral hydration while taking Jardiance. He verbalized understanding and agreed to switch from glipizide to jardiance. He also agreed to [facility]. New rx sent and discontinued glipizide

## 2021-01-01 NOTE — Progress Notes (Signed)
Subjective:  Patient ID: Nicholas Rasmussen, male    DOB: Dec 28, 1945  Age: 75 y.o. MRN: [id number]  CC: Follow-up (DM F/U, Eye Exam Scheduled for September )  HPI  Type 2 diabetes mellitus with diabetic polyneuropathy, without long-term current use of insulin ([place]) Current hgbA1c of 6.1% Known CAD LDL at goal with statin. BMP Latest Ref Rng & Units 11/18/2020 07/15/2020 11/13/2019  Glucose 65 - 99 mg/dL 102(H) 106(H) 99  BUN 8 - 27 mg/dL '20 13 13  '$ Creatinine 0.76 - 1.27 mg/dL 1.18 1.08 1.03  BUN/Creat Ratio 10 - 24 17 - 13  Sodium 134 - 144 mmol/L 140 141 143  Potassium 3.5 - 5.2 mmol/L 4.2 4.1 4.1  Chloride 96 - 106 mmol/L 103 102 105  CO2 20 - 29 mmol/L 28 33(H) 28  Calcium 8.6 - 10.2 mg/dL 9.6 8.9 9.0   We discussed the benefits of jardiance compared to glipizide in the management DM. We also discussed possible side effects of jardiance. Advised about the importance of adequate oral hydration while taking Jardiance. He verbalized understanding and agreed to switch from glipizide to jardiance. He also agreed to [facility]. New rx sent and discontinued glipizide   Reviewed past Medical, Social and Family history today.  Outpatient Medications Prior to Visit  Medication Sig Dispense Refill   acetaminophen (TYLENOL) 500 MG tablet Take 1 tablet (500 mg total) by mouth every 8 (eight) hours as needed. 30 tablet 0   acyclovir cream (ZOVIRAX) 5 % Apply 1 application topically every 6 (six) hours as needed. 5 g 3   atorvastatin (LIPITOR) 20 MG tablet TAKE 1 TABLET (20 MG TOTAL) BY MOUTH DAILY AT 6 PM. 90 tablet 0   divalproex (DEPAKOTE ER) 500 MG 24 hr tablet Take 1 tablet (500 mg total) by mouth at bedtime. Please call [phone] appt or you may speak to your PCP about refills. 90 tablet 4   gabapentin (NEURONTIN) 300 MG capsule Take 1 capsule (300 mg total) by mouth at bedtime. 90 capsule 4   Lancets (ONETOUCH DELICA PLUS [id number] MISC USE AS DIRECTED. TAKE  BLOOD SUGAR 2 -3 TIMES PER WEEK BEFORE MEALS. 100 each 3   metoprolol succinate (TOPROL-XL) 25 MG 24 hr tablet Take 1 tablet (25 mg total) by mouth daily. Please keep upcoming appointment in August 2022 for future refills. Thank you 90 tablet 1   omeprazole (PRILOSEC) 20 MG capsule TAKE 1 CAPSULE (20 MG TOTAL) BY MOUTH DAILY AS NEEDED. 90 capsule 1   glipiZIDE (GLUCOTROL XL) 5 MG 24 hr tablet TAKE 1 TABLET BY MOUTH EVERY DAY WITH BREAKFAST 90 tablet 3   meclizine (ANTIVERT) 12.5 MG tablet Take 1 tablet (12.5 mg total) by mouth 3 (three) times daily as needed for dizziness (do not use for more than 3days). (Patient not taking: Reported on 01/01/2021) 15 tablet 0   No facility-administered medications prior to visit.   ROS See HPI  Objective:  BP 112/60 (BP Location: Right Arm, Patient Position: Sitting, Cuff Size: Large)   Pulse (!) 50   Temp 98.2 F (36.8 C)   Ht '6\' 4"'$  (1.93 m)   Wt 243 lb 3.2 oz (110.3 kg)   BMI 29.60 kg/m   Physical Exam Cardiovascular:     Rate and Rhythm: Normal rate.     Pulses: Normal pulses.  Pulmonary:     Effort: Pulmonary effort is normal.  Neurological:     Mental Status: He is alert  and oriented to person, place, and time.  Psychiatric:        Mood and Affect: Mood normal.        Behavior: Behavior normal.        Thought Content: Thought content normal.    Assessment & Plan:  This visit occurred during the SARS-CoV-2 public health emergency.  Safety protocols were in place, including screening questions prior to the visit, additional usage of staff PPE, and extensive cleaning of exam room while observing appropriate contact time as indicated for disinfecting solutions.   Nicholas Rasmussen was seen today for follow-up.  Diagnoses and all orders for this visit:  Type 2 diabetes mellitus with diabetic polyneuropathy, without long-term current use of insulin (HCC) -     empagliflozin (JARDIANCE) 10 MG TABS tablet; Take 1 tablet (10 mg total) by mouth daily. -      Basic metabolic panel; Future -     Microalbumin / creatinine urine ratio; Future  Coronary artery disease involving native coronary artery of native heart with angina pectoris (HCC) -     empagliflozin (JARDIANCE) 10 MG TABS tablet; Take 1 tablet (10 mg total) by mouth daily.   Problem List Items Addressed This Visit       Cardiovascular and Mediastinum   CAD (coronary artery disease)   Relevant Medications   empagliflozin (JARDIANCE) 10 MG TABS tablet     Endocrine   Type 2 diabetes mellitus with diabetic polyneuropathy, without long-term current use of insulin (HCC) - Primary    Current hgbA1c of 6.1% Known CAD LDL at goal with statin. BMP Latest Ref Rng & Units 11/18/2020 07/15/2020 11/13/2019  Glucose 65 - 99 mg/dL 102(H) 106(H) 99  BUN 8 - 27 mg/dL '20 13 13  '$ Creatinine 0.76 - 1.27 mg/dL 1.18 1.08 1.03  BUN/Creat Ratio 10 - 24 17 - 13  Sodium 134 - 144 mmol/L 140 141 143  Potassium 3.5 - 5.2 mmol/L 4.2 4.1 4.1  Chloride 96 - 106 mmol/L 103 102 105  CO2 20 - 29 mmol/L 28 33(H) 28  Calcium 8.6 - 10.2 mg/dL 9.6 8.9 9.0   We discussed the benefits of jardiance compared to glipizide in the management DM. We also discussed possible side effects of jardiance. Advised about the importance of adequate oral hydration while taking Jardiance. He verbalized understanding and agreed to switch from glipizide to jardiance. He also agreed to [facility] BMP. New rx sent and discontinued glipizide      Relevant Medications   empagliflozin (JARDIANCE) 10 MG TABS tablet   Other Relevant Orders   Basic metabolic panel   Microalbumin / creatinine urine ratio    Follow-up: Return in about 3 months (around 04/03/2021) for DM and HTN, hyperlipidemia (fasting).  CWilfred Lacy NP

## 2021-01-06 ENCOUNTER — Telehealth: Payer: Self-pay | Admitting: Nurse Practitioner

## 2021-01-06 ENCOUNTER — Ambulatory Visit: Payer: PPO

## 2021-01-06 NOTE — Telephone Encounter (Signed)
Pt called and said he needs to cancel for his appointment for today for the [facility] because he has care taking to do and that he will have to call back to reschedule

## 2021-01-07 ENCOUNTER — Ambulatory Visit: Payer: PPO

## 2021-01-08 ENCOUNTER — Ambulatory Visit
Admission: EM | Admit: 2021-01-08 | Discharge: 2021-01-08 | Disposition: A | Discharge status: Home / Self Care | Payer: PPO | Attending: Internal Medicine | Admitting: Internal Medicine

## 2021-01-08 ENCOUNTER — Other Ambulatory Visit: Payer: Self-pay

## 2021-01-08 ENCOUNTER — Telehealth: Payer: PPO | Admitting: Family Medicine

## 2021-01-08 DIAGNOSIS — Z20822 Contact with and (suspected) exposure to covid-19: Secondary | ICD-10-CM

## 2021-01-08 DIAGNOSIS — B349 Viral infection, unspecified: Secondary | ICD-10-CM | POA: Diagnosis not present

## 2021-01-08 MED ORDER — BENZONATATE 100 MG PO CAPS: 100.0000 mg | ORAL_CAPSULE | Freq: Three times a day (TID) | ORAL | 0 refills | Status: DC

## 2021-01-08 MED ORDER — IBUPROFEN 800 MG PO TABS: 800.0000 mg | ORAL_TABLET | Freq: Three times a day (TID) | ORAL | 0 refills | Status: DC

## 2021-01-08 MED ORDER — GUAIFENESIN-DM 100-10 MG/5ML PO SYRP: 5.0000 mL | ORAL_SOLUTION | ORAL | 0 refills | Status: DC | PRN

## 2021-01-08 NOTE — Discharge Instructions (Addendum)
We will contact you if your COVID test is positive.  Please quarantine while you wait for the results.  If your test is negative you may resume normal activities.  If your test is positive please continue to quarantine for at least 5 days from your symptom onset or until you are without a fever for at least 24 hours after the medications.   You can take the Tessalon perles as needed for cough.  You can also take the Robitussin DM as needed for cough at night.    You can take Tylenol and/or Ibuprofen as needed for fever reduction and pain relief.   For cough: honey 1/2 to 1 teaspoon (you can dilute the honey in water or another fluid).  You can also use guaifenesin and dextromethorphan for cough. You can use a humidifier for chest congestion and cough.  If you don't have a humidifier, you can sit in the bathroom with the hot shower running.      For sore throat: try warm salt water gargles, cepacol lozenges, throat spray, warm tea or water with lemon/honey, popsicles or ice, or OTC cold relief medicine for throat discomfort.   For congestion: take a daily anti-histamine like Zyrtec, Claritin, and a oral decongestant, such as pseudoephedrine.  You can also use Flonase 1-2 sprays in each nostril daily.   It is important to stay hydrated: drink plenty of fluids (water, gatorade/powerade/pedialyte, juices, or teas) to keep your throat moisturized and help further relieve irritation/discomfort.

## 2021-01-08 NOTE — ED Provider Notes (Signed)
[facility]    CSN: [id number] Arrival date & time: 01/08/21  1113      History   Chief Complaint Chief Complaint  Patient presents with   Cough    HPI Liamjames Leadbeater is a 75 y.o. male.   Patient presents with productive cough with green sputum, nasal congestion, sore throat and intermittent generalized headache for 3 days. Feels better today but wants to be checked. Tolerating food and liquids, attest to sleeping well. Has not attempted treatment. Vaccinated, no known sick contacts. History of DM, seizures. Denies fever, chills, body aches, ear pain, chest pain, shortness of breath, wheezing, abdominal pain, nausea, vomiting, diarrhea.   Past Medical History:  Diagnosis Date   Allergic rhinitis 05/07/2014   Dizziness 01/02/2016   GERD (gastroesophageal reflux disease)    Headache 03/13/2015   Left flank pain 01/02/2016   Low back pain 06/06/2015   Pain of left side of body 08/13/2015   Post herpetic neuralgia    Routine general medical examination at a health care facility 05/07/2014   Seizure disorder ([place]) 05/07/2014   Seizures ([place])    Last seizure in the 70s.   Shingles    Stye 05/07/2014    Patient Active Problem List   Diagnosis Date Noted   Chronic right-sided low back pain without sciatica 03/31/2020   Varicose veins of both lower extremities with pain 02/02/2019   Encounter for therapeutic drug level monitoring 09/19/2018   Type 2 diabetes mellitus with diabetic polyneuropathy, without long-term current use of insulin ([place]) 03/29/2018   Diabetic neuropathy ([place]) 03/29/2018   Chronic pain of left knee 03/23/2018   CAD (coronary artery disease) 08/03/2017   Post herpetic neuralgia 07/29/2016   Dizziness 01/02/2016   Chronic midline low back pain without sciatica 06/06/2015   GERD (gastroesophageal reflux disease) 11/15/2014   Seizure disorder ([place]) 05/07/2014   Allergic rhinitis 05/07/2014   Routine general medical examination at a health care  facility 05/07/2014    Past Surgical History:  Procedure Laterality Date   COLONOSCOPY     KNEE SURGERY Left    left miniscus   UPPER GASTROINTESTINAL ENDOSCOPY     uvala surgery         Home Medications    Prior to Admission medications   Medication Sig Start Date End Date Taking? Authorizing Provider  benzonatate (TESSALON) 100 MG capsule Take 1 capsule (100 mg total) by mouth every 8 (eight) hours. 01/08/21  Yes Maleki Hippe R, NP  guaiFENesin-dextromethorphan (ROBITUSSIN DM) 100-10 MG/5ML syrup Take 5 mLs by mouth every 4 (four) hours as needed for cough. 01/08/21  Yes Euline Kimbler R, NP  ibuprofen (ADVIL) 800 MG tablet Take 1 tablet (800 mg total) by mouth 3 (three) times daily. 01/08/21  Yes Merla Sawka, Leitha Schuller, NP  acetaminophen (TYLENOL) 500 MG tablet Take 1 tablet (500 mg total) by mouth every 8 (eight) hours as needed. 11/23/17   Nche, Charlene Brooke, NP  acyclovir cream (ZOVIRAX) 5 % Apply 1 application topically every 6 (six) hours as needed. 01/21/20   Nche, Charlene Brooke, NP  atorvastatin (LIPITOR) 20 MG tablet TAKE 1 TABLET (20 MG TOTAL) BY MOUTH DAILY AT 6 PM. 11/17/20   Belva Crome, MD  divalproex (DEPAKOTE ER) 500 MG 24 hr tablet Take 1 tablet (500 mg total) by mouth at bedtime. Please call [phone] for appt or you may speak to your PCP about refills. 03/31/20   Marcial Pacas, MD  empagliflozin (JARDIANCE) 10 MG TABS tablet Take  1 tablet (10 mg total) by mouth daily. 01/01/21   Nche, Charlene Brooke, NP  gabapentin (NEURONTIN) 300 MG capsule Take 1 capsule (300 mg total) by mouth at bedtime. 03/31/20   Marcial Pacas, MD  Lancets [facility] DELICA PLUS [id number]) MISC USE AS DIRECTED. TAKE BLOOD SUGAR 2 -3 TIMES PER WEEK BEFORE MEALS. 12/29/19   Nche, Charlene Brooke, NP  metoprolol succinate (TOPROL-XL) 25 MG 24 hr tablet Take 1 tablet (25 mg total) by mouth daily. Please keep upcoming appointment in August 2022 for future refills. Thank you 10/06/20   Belva Crome, MD  omeprazole  (PRILOSEC) 20 MG capsule TAKE 1 CAPSULE (20 MG TOTAL) BY MOUTH DAILY AS NEEDED. 07/21/20   Nche, Charlene Brooke, NP    Family History Family History  Problem Relation Age of Onset   Diabetes Father    Hypertension Father    Other Mother        natural causes    Social History Social History   Tobacco Use   Smoking status: Former   Smokeless tobacco: Never  Vaping Use   Vaping Use: Never used  Substance Use Topics   Alcohol use: Yes    Alcohol/week: 0.0 standard drinks    Comment: occasional   Drug use: No     Allergies   Patient has no known allergies.   Review of Systems Review of Systems Defer to HPI    Physical Exam Triage Vital Signs ED Triage Vitals  Enc Vitals Group     BP 01/08/21 1121 123/73     Pulse Rate 01/08/21 1121 84     Resp 01/08/21 1121 18     Temp 01/08/21 1121 98.4 F (36.9 C)     Temp Source 01/08/21 1121 Oral     SpO2 01/08/21 1121 97 %     Weight --      Height --      Head Circumference --      Peak Flow --      Pain Score 01/08/21 1122 0     Pain Loc --      Pain Edu? --      Excl. in [place]? --    No data found.  Updated Vital Signs BP 123/73 (BP Location: Left Arm)   Pulse 84   Temp 98.4 F (36.9 C) (Oral)   Resp 18   SpO2 97%   Visual Acuity Right Eye Distance:   Left Eye Distance:   Bilateral Distance:    Right Eye Near:   Left Eye Near:    Bilateral Near:     Physical Exam Constitutional:      Appearance: Normal appearance. He is normal weight.  HENT:     Head: Normocephalic.     Right Ear: Tympanic membrane, ear canal and external ear normal.     Left Ear: Tympanic membrane, ear canal and external ear normal.     Nose: Congestion present. No rhinorrhea.     Mouth/Throat:     Mouth: Mucous membranes are moist.     Pharynx: Posterior oropharyngeal erythema present.  Eyes:     Extraocular Movements: Extraocular movements intact.  Cardiovascular:     Rate and Rhythm: Normal rate and regular rhythm.      Pulses: Normal pulses.     Heart sounds: Normal heart sounds.  Pulmonary:     Effort: Pulmonary effort is normal.     Breath sounds: Normal breath sounds.  Musculoskeletal:     Cervical back: Normal range  of motion and neck supple.  Skin:    General: Skin is warm and dry.  Neurological:     Mental Status: He is alert and oriented to person, place, and time. Mental status is at baseline.  Psychiatric:        Mood and Affect: Mood normal.        Behavior: Behavior normal.     UC Treatments / Results  Labs (all labs ordered are listed, but only abnormal results are displayed) Labs Reviewed  NOVEL CORONAVIRUS, NAA    EKG   Radiology No results found.  Procedures Procedures (including critical care time)  Medications Ordered in UC Medications - No data to display  Initial Impression / Assessment and Plan / UC Course  I have reviewed the triage vital signs and the nursing notes.  Pertinent labs & imaging results that were available during my care of the patient were reviewed by me and considered in my medical decision making (see chart for details).  Viral Illness  Covid test pending Tessalon 100 mg tid prn Promethazine DM 100-'10mg'$ /5ML every 4 hours prn Otc medications and home remedies for remaining symptoms  Return precautions given for respiratory involvement for nearest ED  Ibuprofen 800 mg every 8 hours prn Final Clinical Impressions(s) / UC Diagnoses   Final diagnoses:  Encounter for screening laboratory testing for COVID-19 virus  Viral illness     Discharge Instructions      We will contact you if your COVID test is positive.  Please quarantine while you wait for the results.  If your test is negative you may resume normal activities.  If your test is positive please continue to quarantine for at least 5 days from your symptom onset or until you are without a fever for at least 24 hours after the medications.   You can take the Tessalon perles as needed  for cough.  You can also take the Robitussin DM as needed for cough at night.    You can take Tylenol and/or Ibuprofen as needed for fever reduction and pain relief.   For cough: honey 1/2 to 1 teaspoon (you can dilute the honey in water or another fluid).  You can also use guaifenesin and dextromethorphan for cough. You can use a humidifier for chest congestion and cough.  If you don't have a humidifier, you can sit in the bathroom with the hot shower running.      For sore throat: try warm salt water gargles, cepacol lozenges, throat spray, warm tea or water with lemon/honey, popsicles or ice, or OTC cold relief medicine for throat discomfort.   For congestion: take a daily anti-histamine like Zyrtec, Claritin, and a oral decongestant, such as pseudoephedrine.  You can also use Flonase 1-2 sprays in each nostril daily.   It is important to stay hydrated: drink plenty of fluids (water, gatorade/powerade/pedialyte, juices, or teas) to keep your throat moisturized and help further relieve irritation/discomfort.       ED Prescriptions     Medication Sig Dispense Auth. Provider   benzonatate (TESSALON) 100 MG capsule Take 1 capsule (100 mg total) by mouth every 8 (eight) hours. 21 capsule Presley Gora R, NP   guaiFENesin-dextromethorphan (ROBITUSSIN DM) 100-10 MG/5ML syrup Take 5 mLs by mouth every 4 (four) hours as needed for cough. 118 mL Julionna Marczak R, NP   ibuprofen (ADVIL) 800 MG tablet Take 1 tablet (800 mg total) by mouth 3 (three) times daily. 21 tablet Hans Eden, NP  PDMP not reviewed this encounter.   Hans Eden, [place] 01/08/21 1209

## 2021-01-08 NOTE — ED Triage Notes (Signed)
Pt c/o productive cough with green sputum, congestion, sore throat, and headache since Tuesday.

## 2021-01-09 LAB — SARS-COV-2, NAA 2 DAY TAT

## 2021-01-09 LAB — NOVEL CORONAVIRUS, NAA: SARS-CoV-2, NAA: DETECTED — AB

## 2021-01-10 ENCOUNTER — Other Ambulatory Visit: Payer: Self-pay | Admitting: Nurse Practitioner

## 2021-01-10 DIAGNOSIS — K2100 Gastro-esophageal reflux disease with esophagitis, without bleeding: Secondary | ICD-10-CM

## 2021-01-22 ENCOUNTER — Telehealth: Payer: Self-pay

## 2021-01-22 NOTE — Telephone Encounter (Signed)
Called to check on patient that presented with COVID on 01/10/21, but had not scheduled a virtual appointment with you office to follow up. Pt states he is doing well and has been fine x 1 week. Patient informed to contact us if needed. Pt verbalized understanding.

## 2021-01-23 ENCOUNTER — Encounter: Payer: Self-pay | Admitting: Nurse Practitioner

## 2021-01-23 DIAGNOSIS — H35363 Drusen (degenerative) of macula, bilateral: Secondary | ICD-10-CM | POA: Diagnosis not present

## 2021-01-23 DIAGNOSIS — Z961 Presence of intraocular lens: Secondary | ICD-10-CM | POA: Diagnosis not present

## 2021-01-23 DIAGNOSIS — E119 Type 2 diabetes mellitus without complications: Secondary | ICD-10-CM | POA: Diagnosis not present

## 2021-01-23 DIAGNOSIS — H524 Presbyopia: Secondary | ICD-10-CM | POA: Diagnosis not present

## 2021-01-23 DIAGNOSIS — H35033 Hypertensive retinopathy, bilateral: Secondary | ICD-10-CM | POA: Diagnosis not present

## 2021-01-23 DIAGNOSIS — H26491 Other secondary cataract, right eye: Secondary | ICD-10-CM | POA: Diagnosis not present

## 2021-01-23 LAB — HM DIABETES EYE EXAM

## 2021-02-04 ENCOUNTER — Telehealth: Payer: Self-pay

## 2021-02-04 NOTE — Progress Notes (Addendum)
Chronic Care Management Pharmacy Assistant   Name: Traver Fazzini  MRN: [id number] DOB: [birth year]   Reason for Encounter:Diabetes Disease State Call.   Recent office visits:  01/01/2021 [name] NP (PCP) Stop glipizide, start Jardiance 10 mg daily 11/04/2020 [name] NP (PCP) meclizine  12.5 MG; Take 1 tablet by mouth 3 times daily as needed for dizziness  10/09/2020 [name] NP (PCP) Increase omeprazole '20mg'$  BID x 1week, then once a day 07/18/2020  [name] NP (PCP) No Medication Changes noted 07/15/2020 [name] NP (PCP) start doxycycline 100 MG tablet; Take 1 tablet (100 mg total) by mouth 2 (two) times daily for 7 days 06/10/2020 [name] NP (PCP) No Medication Changes noted  Recent consult visits:  12/29/2020 Dr.Smith MD (Cardiology) No medication Changes noted 05/28/2020 Riki Sheer DO [facility] Medicine) start Polymyxin B-Trimethoprim 10000-0.1 unit/ML right eye every 6 hours  Hospital visits:  Medication Reconciliation was completed by comparing discharge summary, patient's EMR and Pharmacy list, and upon discussion with patient.  Admitted to the hospital on 01/08/2021 due to Covid testing. Discharge date was 01/08/2021. Discharged from [place]?Medications Started at [facility] Discharge:?? Tessalon 100 mg tid prn Promethazine DM 100-'10mg'$ /5ML every 4 hours prn  Ibuprofen 800 mg every 8 hours prn  Medication Changes at Hospital Discharge: -Changed None  Medications Discontinued at Hospital Discharge: -Stopped None  Medications that remain the same after Hospital Discharge:??  -All other medications will remain the same.    Medications: Outpatient Encounter Medications as of 02/04/2021  Medication Sig   acetaminophen (TYLENOL) 500 MG tablet Take 1 tablet (500 mg total) by mouth every 8 (eight) hours as needed.   acyclovir cream (ZOVIRAX) 5 % Apply 1 application topically every 6 (six) hours as needed.    atorvastatin (LIPITOR) 20 MG tablet TAKE 1 TABLET (20 MG TOTAL) BY MOUTH DAILY AT 6 PM.   benzonatate (TESSALON) 100 MG capsule Take 1 capsule (100 mg total) by mouth every 8 (eight) hours.   divalproex (DEPAKOTE ER) 500 MG 24 hr tablet Take 1 tablet (500 mg total) by mouth at bedtime. Please call [phone] for appt or you may speak to your PCP about refills.   empagliflozin (JARDIANCE) 10 MG TABS tablet Take 1 tablet (10 mg total) by mouth daily.   gabapentin (NEURONTIN) 300 MG capsule Take 1 capsule (300 mg total) by mouth at bedtime.   guaiFENesin-dextromethorphan (ROBITUSSIN DM) 100-10 MG/5ML syrup Take 5 mLs by mouth every 4 (four) hours as needed for cough.   ibuprofen (ADVIL) 800 MG tablet Take 1 tablet (800 mg total) by mouth 3 (three) times daily.   Lancets (ONETOUCH DELICA PLUS [id number]) MISC USE AS DIRECTED. TAKE BLOOD SUGAR 2 -3 TIMES PER WEEK BEFORE MEALS.   metoprolol succinate (TOPROL-XL) 25 MG 24 hr tablet Take 1 tablet (25 mg total) by mouth daily. Please keep upcoming appointment in August 2022 for future refills. Thank you   omeprazole (PRILOSEC) 20 MG capsule TAKE 1 CAPSULE BY MOUTH DAILY AS NEEDED.   No facility-administered encounter medications on file as of 02/04/2021.    Care Gaps: Foot Exam COVID-19 Vaccine Hemoglobin A1C Influenza Vaccine Star Rating Drugs: Jardiance 10 mg last filled on 02/02/2021 for [facility] day supply [facility].  Atorvastatin 20 mg last filled on 11/17/2020 for 90 day supply at [facility]. Medication Fill Gaps: None   Recent Relevant Labs: Lab Results  Component Value Date/Time   HGBA1C 6.1 (A) 04/09/2020 09:45 AM  HGBA1C 6.2 10/08/2019 09:51 AM   HGBA1C 6.4 04/11/2019 10:31 AM   MICROALBUR 1.5 10/08/2019 09:51 AM   MICROALBUR 3.3 (H) 10/10/2018 09:27 AM    Kidney Function Lab Results  Component Value Date/Time   CREATININE 1.18 11/18/2020 11:04 AM   CREATININE 1.08 07/15/2020 10:47 AM   GFR 68.65 07/15/2020 10:47 AM   GFRNONAA  73 11/13/2019 02:23 PM   GFRAA 84 11/13/2019 02:23 PM    Current antihyperglycemic regimen:  Jardiance 10 mg 1 tablet daily  Patient states he is doing pretty well on Jardiance, denies any issue/side effects at this time.  What recent interventions/DTPs have been made to improve glycemic control:  None ID  Have there been any recent hospitalizations or ED visits since last visit with CPP? No Patient denies hypoglycemic symptoms, including Pale, Sweaty, Shaky, Hungry, Nervous/irritable, and Vision changes Patient reports hyperglycemic symptoms, including polyuria Patient states he urinates  a lot due to drinking plenty of water. How often are you checking your blood sugar?   Patient states he is not checking his blood sugars at home because he not sure how to use his meter correctly.Inform patient if he would like me to schedule him appointment with the clinical pharmacist,so he can show him how to use the meter correctly.Patient states he had appointment before but prefers not to at this time because he feels his blood sugars are doing good according to his A1C which was 6.1 on 04/09/2020.Patient states if he change his mind he will schedule appointment with the clinical pharmacist. What are your blood sugars ranging?  None During the week, how often does your blood glucose drop below 70?  Patient is not checking his blood sugar at home. Are you checking your feet daily/regularly?   Patient states his feet does have a "weird feeling on and off but nothing like where I cant handle, and no numbness or tingling".  Patient is asking if it is okay for him to have a "drink's (alcohol)"while taking Jardiance.Patient states only on occasion like birthday parties, holiday's or going to dinner on the weekend, but not every day.Notified Clinical Pharmacist.  Per Clinical Pharmacist,please inform him alcohol use can increase risk of hypoglycemia and dehydration when on Jardiance. If he is going to drink  he should be sure to stay hydrated and not overdo it.  Patient Verbalized understanding and appreciation  Adherence Review: Is the patient currently on a STATIN medication? Yes Is the patient currently on ACE/ARB medication? No Does the patient have >5 day gap between last estimated fill dates? No   Anderson Malta Clinical Pharmacist Assistant [phone]   Addendum 02/04/21: Patient not monitoring blood sugars, but acceptable at this time given low complexity of diabetes regimen [name], PharmD, [place], CPP Clinical Pharmacist [facility] [place]  [phone]

## 2021-02-10 ENCOUNTER — Other Ambulatory Visit (INDEPENDENT_AMBULATORY_CARE_PROVIDER_SITE_OTHER): Payer: PPO

## 2021-02-10 ENCOUNTER — Other Ambulatory Visit: Payer: Self-pay

## 2021-02-10 DIAGNOSIS — E1142 Type 2 diabetes mellitus with diabetic polyneuropathy: Secondary | ICD-10-CM

## 2021-02-10 LAB — BASIC METABOLIC PANEL
BUN: 17 mg/dL (ref 6–23)
CO2: 31 mEq/L (ref 19–32)
Calcium: 9.1 mg/dL (ref 8.4–10.5)
Chloride: 104 mEq/L (ref 96–112)
Creatinine, Ser: 1.18 mg/dL (ref 0.40–1.50)
GFR: 61.48 mL/min (ref >60.00)
Glucose, Bld: 102 mg/dL — ABNORMAL HIGH (ref 70–99)
Potassium: 3.8 mEq/L (ref 3.5–5.1)
Sodium: 141 mEq/L (ref 135–145)

## 2021-02-10 LAB — MICROALBUMIN / CREATININE URINE RATIO
Creatinine,U: 198.6 mg/dL
Microalb Creat Ratio: 0.6 mg/g (ref 0.0–30.0)
Microalb, Ur: 1.1 mg/dL (ref 0.0–1.9)

## 2021-03-02 ENCOUNTER — Encounter: Payer: Self-pay | Admitting: Nurse Practitioner

## 2021-03-02 ENCOUNTER — Other Ambulatory Visit: Payer: Self-pay | Admitting: Interventional Cardiology

## 2021-03-02 ENCOUNTER — Ambulatory Visit (INDEPENDENT_AMBULATORY_CARE_PROVIDER_SITE_OTHER): Payer: PPO | Admitting: Nurse Practitioner

## 2021-03-02 ENCOUNTER — Other Ambulatory Visit: Payer: Self-pay

## 2021-03-02 VITALS — BP 116/60 | HR 60 | Temp 96.4°F | Ht 76.0 in | Wt 241.6 lb

## 2021-03-02 DIAGNOSIS — E785 Hyperlipidemia, unspecified: Secondary | ICD-10-CM | POA: Diagnosis not present

## 2021-03-02 DIAGNOSIS — S29012A Strain of muscle and tendon of back wall of thorax, initial encounter: Secondary | ICD-10-CM | POA: Diagnosis not present

## 2021-03-02 DIAGNOSIS — E1142 Type 2 diabetes mellitus with diabetic polyneuropathy: Secondary | ICD-10-CM

## 2021-03-02 DIAGNOSIS — N281 Cyst of kidney, acquired: Secondary | ICD-10-CM

## 2021-03-02 DIAGNOSIS — E1169 Type 2 diabetes mellitus with other specified complication: Secondary | ICD-10-CM

## 2021-03-02 DIAGNOSIS — Z23 Encounter for immunization: Secondary | ICD-10-CM

## 2021-03-02 DIAGNOSIS — I25119 Atherosclerotic heart disease of native coronary artery with unspecified angina pectoris: Secondary | ICD-10-CM

## 2021-03-02 LAB — POCT GLYCOSYLATED HEMOGLOBIN (HGB A1C): Hemoglobin A1C: 6.2 % — AB (ref 4.0–5.6)

## 2021-03-02 MED ORDER — ATORVASTATIN CALCIUM 20 MG PO TABS: 20.0000 mg | ORAL_TABLET | Freq: Every day | ORAL | 1 refills | Status: DC

## 2021-03-02 NOTE — Progress Notes (Signed)
Reviewed with patient in office. See office note

## 2021-03-02 NOTE — Assessment & Plan Note (Signed)
Controlled with today's hgbA1c at 6.2% Maintain current medications

## 2021-03-02 NOTE — Patient Instructions (Addendum)
Use tylenol and/or lidocaine/aspercreme/icyhpt patch as directed on package. You will be contacted to schedule appt for repeat MRI abdomen to re evaluate right renal cyst.  HgbA1c of 6.2%: Dm is controlled. Continue current medications.  Muscle Strain A muscle strain, or pulled muscle, happens when a muscle is stretched beyond its normal length. This can tear some muscle fibers and cause pain. Usually, it takes 1-2 weeks to heal from a muscle strain. Full healing normally takes 5-6 weeks. What are the causes? This condition is caused when a sudden force is placed on a muscle and stretches it too far. This can happen with a fall, while lifting, or during sports. What increases the risk? You are more likely to develop a muscle strain if you are an athlete or you do a lot of physical activity. What are the signs or symptoms? Pain. Tenderness. Bruising. Swelling. Trouble using the muscle. How is this treated? This condition is first treated with PRICE therapy. This involves: Protecting your muscle from being injured again. Resting your injured muscle. Icing your injured muscle. Putting pressure (compression) on your injured muscle. This may be done with a splint or elastic bandage. Raising (elevating) your injured muscle. Your doctor may also recommend medicine for pain. Follow these instructions at home: If you have a splint that can be taken off: Wear the splint as told by your doctor. Take it off only as told by your doctor. Check the skin around the splint every day. Tell your doctor if you see problems. Loosen the splint if your fingers or toes: Tingle. Become numb. Turn cold and blue. Keep the splint clean. If the splint is not waterproof: Do not let it get wet. Cover it with a watertight covering when you take a bath or a shower. Managing pain, stiffness, and swelling  If told, put ice on your injured area. To do this: If you have a removable splint, take it off as told by  your doctor. Put ice in a plastic bag. Place a towel between your skin and the bag. Leave the ice on for 20 minutes, 2-3 times a day. Take off the ice if your skin turns bright red. This is very important. If you cannot feel pain, heat, or cold, you have a greater risk of damage to the area. Move your fingers or toes often. Raise the injured area above the level of your heart while you are sitting or lying down. Wear an elastic bandage as told by your doctor. Make sure it is not too tight. General instructions Take over-the-counter and prescription medicines only as told by your doctor. This may include: Medicines for pain and swelling that are taken by mouth or put on the skin. Medicines to help relax your muscles. Limit your activity. Rest your injured muscle as told by your doctor. Your doctor may say that gentle movements are okay. If physical therapy was prescribed, do exercises as told by your doctor. Do not put pressure on any part of the splint until it is fully hardened. This may take many hours. Do not smoke or use any products that contain nicotine or tobacco. If you need help quitting, ask your doctor. Ask your doctor when it is safe to drive if you have a splint. Keep all follow-up visits. How is this prevented? Warm up before you exercise. This helps to prevent more muscle strains. Contact a doctor if: You have more pain or swelling in the injured area. Get help right away if: You have any  of these problems in your injured area: Numbness. Tingling. Less strength than normal. Summary A muscle strain is an injury that happens when a muscle is stretched beyond normal length. This condition is first treated with PRICE therapy. This includes protecting, resting, icing, adding pressure, and raising your injury. Limit your activity. Rest your injured muscle as told by your doctor. Your doctor may say that gentle movements are okay. Warm up before you exercise. This helps to  prevent more muscle strains. This information is not intended to replace advice given to you by your health care provider. Make sure you discuss any questions you have with your health care provider. Document Revised: 07/28/2020 Document Reviewed: 07/28/2020 Elsevier Patient Education  [place].

## 2021-03-02 NOTE — Progress Notes (Signed)
Subjective:  Patient ID: Nicholas Rasmussen, male    DOB: [birth year]  Age: 75 y.o. MRN: [id number]  CC: Acute Visit (Pt c/o left side and back pain x 3 days along with right leg pain in calf area. Pt has tried otc medication with slight relief. )  Muscle Pain This is a new problem. The current episode started in the past 7 days. The problem occurs constantly. The problem has been gradually improving since onset. The pain occurs in the context of an injury. The pain is present in the upper back (left side). The pain is mild. The symptoms are aggravated by any movement. Pertinent negatives include no chest pain, fatigue, fever, stiffness, sensory change, shortness of breath, weakness or wheezing. Past treatments include acetaminophen. The treatment provided moderate relief. There is no swelling present. He has been Behaving normally. There is no history of chronic back pain, rheumatic disease or sickle cell disease.  Onset of pain after lifting boxes on Friday. Type 2 diabetes mellitus with diabetic polyneuropathy, without long-term current use of insulin (HCC) Controlled with today's hgbA1c at 6.2% Maintain current medications   States he need refill on atorvastatin.  Reviewed past Medical, Social and Family history today.  Outpatient Medications Prior to Visit  Medication Sig Dispense Refill   acetaminophen (TYLENOL) 500 MG tablet Take 1 tablet (500 mg total) by mouth every 8 (eight) hours as needed. 30 tablet 0   acyclovir cream (ZOVIRAX) 5 % Apply 1 application topically every 6 (six) hours as needed. 5 g 3   divalproex (DEPAKOTE ER) 500 MG 24 hr tablet Take 1 tablet (500 mg total) by mouth at bedtime. Please call [phone] for appt or you may speak to your PCP about refills. 90 tablet 4   empagliflozin (JARDIANCE) 10 MG TABS tablet Take 1 tablet (10 mg total) by mouth daily. 30 tablet 5   gabapentin (NEURONTIN) 300 MG capsule Take 1 capsule (300 mg total) by mouth at bedtime. 90 capsule 4    guaiFENesin-dextromethorphan (ROBITUSSIN DM) 100-10 MG/5ML syrup Take 5 mLs by mouth every 4 (four) hours as needed for cough. 118 mL 0   ibuprofen (ADVIL) 800 MG tablet Take 1 tablet (800 mg total) by mouth 3 (three) times daily. 21 tablet 0   Lancets (ONETOUCH DELICA PLUS [id number]) MISC USE AS DIRECTED. TAKE BLOOD SUGAR 2 -3 TIMES PER WEEK BEFORE MEALS. 100 each 3   metoprolol succinate (TOPROL-XL) 25 MG 24 hr tablet Take 1 tablet (25 mg total) by mouth daily. Please keep upcoming appointment in August 2022 for future refills. Thank you 90 tablet 1   omeprazole (PRILOSEC) 20 MG capsule TAKE 1 CAPSULE BY MOUTH DAILY AS NEEDED. 90 capsule 1   atorvastatin (LIPITOR) 20 MG tablet TAKE 1 TABLET (20 MG TOTAL) BY MOUTH DAILY AT 6 PM. 90 tablet 0   benzonatate (TESSALON) 100 MG capsule Take 1 capsule (100 mg total) by mouth every 8 (eight) hours. (Patient not taking: Reported on 03/02/2021) 21 capsule 0   No facility-administered medications prior to visit.    ROS See HPI  Objective:  BP 116/60 (BP Location: Left Arm, Patient Position: Sitting, Cuff Size: Large)   Pulse 60   Temp (!) 96.4 F (35.8 C) (Temporal)   Ht 6\' 4"  (1.93 m)   Wt 241 lb 9.6 oz (109.6 kg)   SpO2 98%   BMI 29.41 kg/m   Physical Exam Cardiovascular:     Pulses:          Dorsalis  pedis pulses are 2+ on the right side and 2+ on the left side.       Posterior tibial pulses are 2+ on the right side and 2+ on the left side.  Musculoskeletal:     Right shoulder: Normal.     Left shoulder: Normal.     Right upper arm: Normal.     Left upper arm: Normal.     Cervical back: Normal.     Thoracic back: Spasms and tenderness present. No bony tenderness. Normal range of motion. No scoliosis.     Lumbar back: Normal.     Right foot: Normal range of motion. Prominent metatarsal heads present.     Left foot: Normal range of motion. Prominent metatarsal heads present.  Feet:     Right foot:     Protective Sensation: 8 sites  tested.  8 sites sensed.     Skin integrity: Skin integrity normal.     Toenail Condition: Right toenails are normal.     Left foot:     Protective Sensation: 8 sites tested.  8 sites sensed.     Skin integrity: Skin integrity normal.     Toenail Condition: Left toenails are normal.   Assessment & Plan:  This visit occurred during the SARS-CoV-2 public health emergency.  Safety protocols were in place, including screening questions prior to the visit, additional usage of staff PPE, and extensive cleaning of exam room while observing appropriate contact time as indicated for disinfecting solutions.   Nicholas Rasmussen was seen today for acute visit.  Diagnoses and all orders for this visit:  Muscle strain of left upper back, initial encounter  Flu vaccine need -     Flu Vaccine QUAD High Dose(Fluad)  Renal cyst, right -     MR Abdomen W Wo Contrast; Future  Type 2 diabetes mellitus with diabetic polyneuropathy, without long-term current use of insulin (HCC) -     POCT glycosylated hemoglobin (Hb A1C)  Coronary artery disease involving native coronary artery of native heart with angina pectoris (HCC) -     atorvastatin (LIPITOR) 20 MG tablet; Take 1 tablet (20 mg total) by mouth daily at 6 PM.  Hyperlipidemia associated with type 2 diabetes mellitus (HCC) -     atorvastatin (LIPITOR) 20 MG tablet; Take 1 tablet (20 mg total) by mouth daily at 6 PM.  Problem List Items Addressed This Visit       Cardiovascular and Mediastinum   CAD (coronary artery disease)   Relevant Medications   atorvastatin (LIPITOR) 20 MG tablet     Endocrine   Type 2 diabetes mellitus with diabetic polyneuropathy, without long-term current use of insulin (HCC)    Controlled with today's hgbA1c at 6.2% Maintain current medications      Relevant Medications   atorvastatin (LIPITOR) 20 MG tablet   Other Relevant Orders   POCT glycosylated hemoglobin (Hb A1C) (Completed)   Other Visit Diagnoses     Muscle  strain of left upper back, initial encounter    -  Primary   Flu vaccine need       Relevant Orders   Flu Vaccine QUAD High Dose(Fluad) (Completed)   Renal cyst, right       Relevant Orders   MR Abdomen W Wo Contrast   Hyperlipidemia associated with type 2 diabetes mellitus ([place])       Relevant Medications   atorvastatin (LIPITOR) 20 MG tablet       Follow-up: Return if symptoms worsen or fail  to improve.  [name], NP

## 2021-03-18 ENCOUNTER — Other Ambulatory Visit: Payer: Self-pay | Admitting: Nurse Practitioner

## 2021-03-18 ENCOUNTER — Telehealth: Payer: Self-pay | Admitting: Nurse Practitioner

## 2021-03-18 DIAGNOSIS — H8111 Benign paroxysmal vertigo, right ear: Secondary | ICD-10-CM

## 2021-03-24 NOTE — Telephone Encounter (Signed)
According to message patient has already been out of town. Closing encounter

## 2021-03-30 ENCOUNTER — Telehealth: Payer: Self-pay | Admitting: Nurse Practitioner

## 2021-03-30 NOTE — Telephone Encounter (Signed)
Pt is wanting his 03/1121 appointment cancelled only when he can get this rescheduled. The next available appointment is not until 12/29. He is saying, you said he could be double booked. If so I can schedule this for him.

## 2021-03-31 ENCOUNTER — Other Ambulatory Visit: Payer: Self-pay

## 2021-03-31 ENCOUNTER — Ambulatory Visit
Admission: RE | Admit: 2021-03-31 | Discharge: 2021-03-31 | Disposition: A | Discharge status: Home / Self Care | Payer: PPO | Source: Ambulatory Visit | Attending: Nurse Practitioner | Admitting: Nurse Practitioner

## 2021-03-31 DIAGNOSIS — K7689 Other specified diseases of liver: Secondary | ICD-10-CM | POA: Diagnosis not present

## 2021-03-31 DIAGNOSIS — N281 Cyst of kidney, acquired: Secondary | ICD-10-CM

## 2021-03-31 DIAGNOSIS — M5136 Other intervertebral disc degeneration, lumbar region: Secondary | ICD-10-CM | POA: Diagnosis not present

## 2021-03-31 DIAGNOSIS — I700 Atherosclerosis of aorta: Secondary | ICD-10-CM | POA: Diagnosis not present

## 2021-03-31 MED ORDER — GADOBENATE DIMEGLUMINE 529 MG/ML IV SOLN
20.0000 mL | Freq: Once | INTRAVENOUS | Status: AC | PRN
  Administered 2021-03-31: 20 mL via INTRAVENOUS

## 2021-03-31 NOTE — Telephone Encounter (Signed)
Pt aware and is going to come in on Friday

## 2021-03-31 NOTE — Telephone Encounter (Signed)
Pt called to follow up on this because he said he does want to reschedule but he doesn't want to cancel if he has to wait until next available which is late December

## 2021-04-02 ENCOUNTER — Other Ambulatory Visit: Payer: Self-pay

## 2021-04-03 ENCOUNTER — Encounter: Payer: Self-pay | Admitting: Nurse Practitioner

## 2021-04-03 ENCOUNTER — Ambulatory Visit (INDEPENDENT_AMBULATORY_CARE_PROVIDER_SITE_OTHER): Payer: PPO | Admitting: Nurse Practitioner

## 2021-04-03 VITALS — BP 122/68 | HR 60 | Temp 96.1°F | Wt 240.6 lb

## 2021-04-03 DIAGNOSIS — N281 Cyst of kidney, acquired: Secondary | ICD-10-CM | POA: Diagnosis not present

## 2021-04-03 DIAGNOSIS — E1142 Type 2 diabetes mellitus with diabetic polyneuropathy: Secondary | ICD-10-CM | POA: Diagnosis not present

## 2021-04-03 DIAGNOSIS — H8111 Benign paroxysmal vertigo, right ear: Secondary | ICD-10-CM

## 2021-04-03 DIAGNOSIS — E1169 Type 2 diabetes mellitus with other specified complication: Secondary | ICD-10-CM | POA: Diagnosis not present

## 2021-04-03 DIAGNOSIS — E785 Hyperlipidemia, unspecified: Secondary | ICD-10-CM | POA: Diagnosis not present

## 2021-04-03 DIAGNOSIS — R42 Dizziness and giddiness: Secondary | ICD-10-CM

## 2021-04-03 DIAGNOSIS — I25119 Atherosclerotic heart disease of native coronary artery with unspecified angina pectoris: Secondary | ICD-10-CM | POA: Diagnosis not present

## 2021-04-03 MED ORDER — MECLIZINE HCL 25 MG PO TABS: 25.0000 mg | ORAL_TABLET | Freq: Three times a day (TID) | ORAL | 0 refills | Status: DC | PRN

## 2021-04-03 NOTE — Patient Instructions (Addendum)
Go to lab for urinalysis today  Schedule fasting last appt in 50months. Need to be fasting 8hrs prior to blood draw. Ok to drink water.

## 2021-04-03 NOTE — Progress Notes (Signed)
Subjective:  Patient ID: Nicholas Rasmussen, male    DOB: [birth year]  Age: 75 y.o. MRN: [id number]  CC: Follow-up (3 month f/u on DM, HTN, and Cholesterol. Pt is not fasting. )  HPI  [name] Improves with vertigo exercise and use of meclizine prn. Advised about importance of adequate oral hydration and changing positions slowly. Meclizine refill sent  Type 2 diabetes mellitus with diabetic polyneuropathy, without long-term current use of insulin (HCC) Repeat hgbA1c and BMP in 73months. Weight loss noted with jardiance. Denies any adverse side effects Wt Readings from Last 3 Encounters:  04/03/21 240 lb 9.6 oz (109.1 kg)  03/02/21 241 lb 9.6 oz (109.6 kg)  01/01/21 243 lb 3.2 oz (110.3 kg)  Stable neuropathy with gabapentin LDL at goal with aorvastatin. Refills rx  Complex renal cyst First noted on CT ABD/pelvis 01/2019: Potentially complex 7 mm lesion of the right kidney lower pole is unchanged from 2017.  CT ABD/pelvis 06/2020: Small intermediate density focus with increase in size in the RIGHT kidney showing postcontrast density more than expected for simple cyst, and with increase in size, raising the question of small solid, enhancing renal neoplasm of the RIGHT kidney potentially proteinaceous cyst though baseline noncontrast density without increase over background renal density. MRI may be helpful for further assessment. Stable benign-appearing peripherally calcified lesion in the LEFT kidney likely peripherally calcified cyst, Bosniak category II. MRI ABD 07/2020: 8 mm lesion of concern in the interpolar right kidney on previous CT has imaging features on MRI most suggestive of a cyst complicated by proteinaceous debris or hemorrhage (Bosniak II). Tiny size and some motion degradation hinder definitive assessment. Conservatively, follow-up MRI in 6 months could be used to ensure stability.   We Discussed repeat MRI ABD results today  03/2021: 1. Complex lesion in the right mid kidney measuring 7 by 8 mm has high precontrast T1 signal characteristics probably representing proteinaceous or hemorrhagic contents. The small size of the lesion relative to the motion artifact and misregistration make it indeterminate for enhancement on today's examination, and accordingly a formal Bosniak classification cannot be assigned. However, by my measurements this lesion is stable in size from 02/09/2019, which is somewhat reassuring. Surveillance imaging by renal protocol CT or MRI in 12-18 months recommended. Benign Bosniak category 2 cyst of the left kidney upper pole.  Urinalysis is negative for hematuria  repeat MRI in 63months   Reviewed past Medical, Social and Family history today.  Outpatient Medications Prior to Visit  Medication Sig Dispense Refill   acetaminophen (TYLENOL) 500 MG tablet Take 1 tablet (500 mg total) by mouth every 8 (eight) hours as needed. 30 tablet 0   acyclovir cream (ZOVIRAX) 5 % Apply 1 application topically every 6 (six) hours as needed. 5 g 3   divalproex (DEPAKOTE ER) 500 MG 24 hr tablet Take 1 tablet (500 mg total) by mouth at bedtime. Please call [phone] for appt or you may speak to your PCP about refills. 90 tablet 4   gabapentin (NEURONTIN) 300 MG capsule Take 1 capsule (300 mg total) by mouth at bedtime. 90 capsule 4   guaiFENesin-dextromethorphan (ROBITUSSIN DM) 100-10 MG/5ML syrup Take 5 mLs by mouth every 4 (four) hours as needed for cough. 118 mL 0   ibuprofen (ADVIL) 800 MG tablet Take 1 tablet (800 mg total) by mouth 3 (three) times daily. 21 tablet 0   Lancets (ONETOUCH DELICA PLUS [id number]) MISC USE AS DIRECTED. TAKE BLOOD SUGAR 2 -3  TIMES PER WEEK BEFORE MEALS. 100 each 3   metoprolol succinate (TOPROL-XL) 25 MG 24 hr tablet Take 1 tablet (25 mg total) by mouth daily. Please keep upcoming appointment in August 2022 for future refills. Thank you 90 tablet 1   omeprazole (PRILOSEC) 20 MG  capsule TAKE 1 CAPSULE BY MOUTH DAILY AS NEEDED. 90 capsule 1   atorvastatin (LIPITOR) 20 MG tablet Take 1 tablet (20 mg total) by mouth daily at 6 PM. 90 tablet 1   empagliflozin (JARDIANCE) 10 MG TABS tablet Take 1 tablet (10 mg total) by mouth daily. 30 tablet 5   MECLIZINE HCL PO Take by mouth.     No facility-administered medications prior to visit.    ROS See HPI  Objective:  BP 122/68 (BP Location: Left Arm, Patient Position: Sitting, Cuff Size: Large)   Pulse 60   Temp (!) 96.1 F (35.6 C) (Temporal)   Wt 240 lb 9.6 oz (109.1 kg)   SpO2 98%   BMI 29.29 kg/m   Physical Exam Vitals reviewed.  Cardiovascular:     Rate and Rhythm: Normal rate.     Pulses: Normal pulses.  Pulmonary:     Effort: Pulmonary effort is normal.  Abdominal:     General: Bowel sounds are normal.     Palpations: Abdomen is soft.  Musculoskeletal:     Right lower leg: No edema.     Left lower leg: No edema.  Neurological:     Mental Status: He is alert and oriented to person, place, and time.  Psychiatric:        Mood and Affect: Mood normal.        Behavior: Behavior normal.        Thought Content: Thought content normal.    Assessment & Plan:  This visit occurred during the SARS-CoV-2 public health emergency.  Safety protocols were in place, including screening questions prior to the visit, additional usage of staff PPE, and extensive cleaning of exam room while observing appropriate contact time as indicated for disinfecting solutions.   Nicholas Rasmussen was seen today for follow-up.  Diagnoses and all orders for this visit:  Type 2 diabetes mellitus with diabetic polyneuropathy, without long-term current use of insulin (HCC) -     Hemoglobin A1c; Future -     Basic metabolic panel; Future -     empagliflozin (JARDIANCE) 10 MG TABS tablet; Take 1 tablet (10 mg total) by mouth daily.  Complex renal cyst -     Urinalysis w microscopic + reflex cultur  Benign paroxysmal positional vertigo of  right ear -     meclizine (ANTIVERT) 25 MG tablet; Take 1 tablet (25 mg total) by mouth 3 (three) times daily as needed.  Hyperlipidemia associated with type 2 diabetes mellitus ([place]) -     Lipid panel; Future -     Hepatic function panel; Future -     atorvastatin (LIPITOR) 20 MG tablet; Take 1 tablet (20 mg total) by mouth daily at 6 PM.  Coronary artery disease involving native coronary artery of native heart with angina pectoris (HCC) -     atorvastatin (LIPITOR) 20 MG tablet; Take 1 tablet (20 mg total) by mouth daily at 6 PM. -     empagliflozin (JARDIANCE) 10 MG TABS tablet; Take 1 tablet (10 mg total) by mouth daily.  Other orders -     REFLEXIVE URINE CULTURE   Problem List Items Addressed This Visit       Cardiovascular and Mediastinum  CAD (coronary artery disease)   Relevant Medications   atorvastatin (LIPITOR) 20 MG tablet   empagliflozin (JARDIANCE) 10 MG TABS tablet     Endocrine   Type 2 diabetes mellitus with diabetic polyneuropathy, without long-term current use of insulin (HCC) - Primary    Repeat hgbA1c and BMP in 44months. Weight loss noted with jardiance. Denies any adverse side effects Wt Readings from Last 3 Encounters:  04/03/21 240 lb 9.6 oz (109.1 kg)  03/02/21 241 lb 9.6 oz (109.6 kg)  01/01/21 243 lb 3.2 oz (110.3 kg)  Stable neuropathy with gabapentin LDL at goal with aorvastatin. Refills rx      Relevant Medications   atorvastatin (LIPITOR) 20 MG tablet   empagliflozin (JARDIANCE) 10 MG TABS tablet   Other Relevant Orders   Hemoglobin Q2W   Basic metabolic panel     Nervous and Auditory   [name]    Improves with vertigo exercise and use of meclizine prn. Advised about importance of adequate oral hydration and changing positions slowly. Meclizine refill sent      Relevant Medications   meclizine (ANTIVERT) 25 MG tablet     Genitourinary   Complex renal cyst    First noted on CT ABD/pelvis  01/2019: Potentially complex 7 mm lesion of the right kidney lower pole is unchanged from 2017.  CT ABD/pelvis 06/2020: Small intermediate density focus with increase in size in the RIGHT kidney showing postcontrast density more than expected for simple cyst, and with increase in size, raising the question of small solid, enhancing renal neoplasm of the RIGHT kidney potentially proteinaceous cyst though baseline noncontrast density without increase over background renal density. MRI may be helpful for further assessment. Stable benign-appearing peripherally calcified lesion in the LEFT kidney likely peripherally calcified cyst, Bosniak category II. MRI ABD 07/2020: 8 mm lesion of concern in the interpolar right kidney on previous CT has imaging features on MRI most suggestive of a cyst complicated by proteinaceous debris or hemorrhage (Bosniak II). Tiny size and some motion degradation hinder definitive assessment. Conservatively, follow-up MRI in 6 months could be used to ensure stability.   We Discussed repeat MRI ABD results today 03/2021: 1. Complex lesion in the right mid kidney measuring 7 by 8 mm has high precontrast T1 signal characteristics probably representing proteinaceous or hemorrhagic contents. The small size of the lesion relative to the motion artifact and misregistration make it indeterminate for enhancement on today's examination, and accordingly a formal Bosniak classification cannot be assigned. However, by my measurements this lesion is stable in size from 02/09/2019, which is somewhat reassuring. Surveillance imaging by renal protocol CT or MRI in 12-18 months recommended. Benign Bosniak category 2 cyst of the left kidney upper pole.  Urinalysis is negative for hematuria  repeat MRI in 74months      Relevant Orders   Urinalysis w microscopic + reflex cultur (Completed)   Other Visit Diagnoses     Hyperlipidemia associated with type 2 diabetes mellitus ([place])       Relevant  Medications   atorvastatin (LIPITOR) 20 MG tablet   empagliflozin (JARDIANCE) 10 MG TABS tablet   Other Relevant Orders   Lipid panel   Hepatic function panel       Follow-up: Return in about 6 months (around 10/01/2021) for DM and HTN, hyperlipidemia (fasting).  [name], NP

## 2021-04-04 LAB — URINALYSIS W MICROSCOPIC + REFLEX CULTURE
Bacteria, UA: NONE SEEN /HPF
Bilirubin Urine: NEGATIVE
Hgb urine dipstick: NEGATIVE
Hyaline Cast: NONE SEEN /LPF
Leukocyte Esterase: NEGATIVE
Nitrites, Initial: NEGATIVE
Protein, ur: NEGATIVE
RBC / HPF: NONE SEEN /HPF (ref 0–2)
Specific Gravity, Urine: 1.037 — ABNORMAL HIGH (ref 1.001–1.035)
Squamous Epithelial / HPF: NONE SEEN /HPF (ref <=5)
WBC, UA: NONE SEEN /HPF (ref 0–5)
pH: 6.5 (ref 5.0–8.0)

## 2021-04-04 LAB — NO CULTURE INDICATED

## 2021-04-06 ENCOUNTER — Other Ambulatory Visit: Payer: Self-pay | Admitting: Nurse Practitioner

## 2021-04-06 DIAGNOSIS — R21 Rash and other nonspecific skin eruption: Secondary | ICD-10-CM

## 2021-04-07 ENCOUNTER — Encounter: Payer: Self-pay | Admitting: Nurse Practitioner

## 2021-04-07 ENCOUNTER — Telehealth: Payer: Self-pay | Admitting: Nurse Practitioner

## 2021-04-07 DIAGNOSIS — H8111 Benign paroxysmal vertigo, right ear: Secondary | ICD-10-CM | POA: Insufficient documentation

## 2021-04-07 DIAGNOSIS — R21 Rash and other nonspecific skin eruption: Secondary | ICD-10-CM

## 2021-04-07 DIAGNOSIS — N281 Cyst of kidney, acquired: Secondary | ICD-10-CM | POA: Insufficient documentation

## 2021-04-07 MED ORDER — EMPAGLIFLOZIN 10 MG PO TABS: 10.0000 mg | ORAL_TABLET | Freq: Every day | ORAL | 1 refills | Status: DC

## 2021-04-07 MED ORDER — ATORVASTATIN CALCIUM 20 MG PO TABS: 20.0000 mg | ORAL_TABLET | Freq: Every day | ORAL | 1 refills | Status: DC

## 2021-04-07 NOTE — Assessment & Plan Note (Addendum)
Improves with vertigo exercise and use of meclizine prn. Advised about importance of adequate oral hydration and changing positions slowly. Meclizine refill sent

## 2021-04-07 NOTE — Assessment & Plan Note (Addendum)
First noted on CT ABD/pelvis 01/2019: Potentially complex 7 mm lesion of the right kidney lower pole is unchanged from 2017.  CT ABD/pelvis 06/2020: Small intermediate density focus with increase in size in the RIGHT kidney showing postcontrast density more than expected for simple cyst, and with increase in size, raising the question of small solid, enhancing renal neoplasm of the RIGHT kidney potentially proteinaceous cyst though baseline noncontrast density without increase over background renal density. MRI may be helpful for further assessment. Stable benign-appearing peripherally calcified lesion in the LEFT kidney likely peripherally calcified cyst, Bosniak category II. MRI ABD 07/2020: 8 mm lesion of concern in the interpolar right kidney on previous CT has imaging features on MRI most suggestive of a cyst complicated by proteinaceous debris or hemorrhage (Bosniak II). Tiny size and some motion degradation hinder definitive assessment. Conservatively, follow-up MRI in 6 months could be used to ensure stability.   We Discussed repeat MRI ABD results today 03/2021: 1. Complex lesion in the right mid kidney measuring 7 by 8 mm has high precontrast T1 signal characteristics probably representing proteinaceous or hemorrhagic contents. The small size of the lesion relative to the motion artifact and misregistration make it indeterminate for enhancement on today's examination, and accordingly a formal Bosniak classification cannot be assigned. However, by my measurements this lesion is stable in size from 02/09/2019, which is somewhat reassuring. Surveillance imaging by renal protocol CT or MRI in 12-18 months recommended. Benign Bosniak category 2 cyst of the left kidney upper pole.  Urinalysis is negative for hematuria  repeat MRI in 62months

## 2021-04-07 NOTE — Assessment & Plan Note (Addendum)
Repeat hgbA1c and BMP in 78months. Weight loss noted with jardiance. Denies any adverse side effects Wt Readings from Last 3 Encounters:  04/03/21 240 lb 9.6 oz (109.1 kg)  03/02/21 241 lb 9.6 oz (109.6 kg)  01/01/21 243 lb 3.2 oz (110.3 kg)  Stable neuropathy with gabapentin LDL at goal with aorvastatin. Refills rx

## 2021-04-07 NOTE — Telephone Encounter (Signed)
What is the name of the medication? acyclovir cream (ZOVIRAX) 5 % [[id number]]  Have you contacted your pharmacy to request a refill? Pt is needing a refill on this script.  Which pharmacy would you like this sent to? [id number] [address], [address] [place] [address]  Phone:  [phone]  Fax:  [phone]  DEA #:  [id number]   Patient notified that their request is being sent to the clinical staff for review and that they should receive a call once it is complete. If they do not receive a call within 72 hours they can check with their pharmacy or our office.

## 2021-04-09 ENCOUNTER — Ambulatory Visit: Payer: PPO | Admitting: Nurse Practitioner

## 2021-04-10 MED ORDER — ACYCLOVIR 5 % EX CREA: 1.0000 "application " | TOPICAL_CREAM | Freq: Four times a day (QID) | CUTANEOUS | 3 refills | Status: DC | PRN

## 2021-04-10 NOTE — Telephone Encounter (Signed)
Chart supports Rx Sent to pharmacy.

## 2021-04-22 NOTE — Telephone Encounter (Signed)
duplicate

## 2021-05-26 ENCOUNTER — Ambulatory Visit (INDEPENDENT_AMBULATORY_CARE_PROVIDER_SITE_OTHER): Payer: PPO

## 2021-05-26 DIAGNOSIS — Z0000 Encounter for general adult medical examination without abnormal findings: Secondary | ICD-10-CM | POA: Diagnosis not present

## 2021-05-26 NOTE — Progress Notes (Signed)
Subjective:   Nicholas Rasmussen is a 76 y.o. male who presents for an Subsequent Medicare Annual Wellness Visit.  I connected with [name] today by telephone and verified that I am speaking with the correct person using two identifiers. Location patient: home Location provider: work Persons participating in the virtual visit: patient, provider.   I discussed the limitations, risks, security and privacy concerns of performing an evaluation and management service by telephone and the availability of in person appointments. I also discussed with the patient that there may be a patient responsible charge related to this service. The patient expressed understanding and verbally consented to this telephonic visit.    Interactive audio and video telecommunications were attempted between this provider and patient, however failed, due to patient having technical difficulties OR patient did not have access to video capability.  We continued and completed visit with audio only.    Review of Systems     Cardiac Risk Factors include: advanced age (>66men, >75 women);male gender;hypertension;dyslipidemia     Objective:    Today's Vitals   There is no height or weight on file to calculate BMI.  Advanced Directives 05/26/2021 02/01/2020 01/02/2020 02/20/2019 10/26/2017 01/19/2017 11/17/2016  Does Patient Have a Medical Advance Directive? Yes No No No No No No  Type of Paramedic of [place];Living will - - - - - -  Copy of [place] in Chart? No - copy requested - - - - - -  Would patient like information on creating a medical advance directive? - No - Patient declined Yes (MAU/Ambulatory/Procedural Areas - Information given) No - Patient declined - - Yes (ED - Information included in AVS)    Current Medications (verified) Outpatient Encounter Medications as of 05/26/2021  Medication Sig   acetaminophen (TYLENOL) 500 MG tablet Take 1 tablet (500 mg total) by  mouth every 8 (eight) hours as needed.   acyclovir cream (ZOVIRAX) 5 % Apply 1 application topically every 6 (six) hours as needed.   atorvastatin (LIPITOR) 20 MG tablet Take 1 tablet (20 mg total) by mouth daily at 6 PM.   divalproex (DEPAKOTE ER) 500 MG 24 hr tablet Take 1 tablet (500 mg total) by mouth at bedtime. Please call [phone] for appt or you may speak to your PCP about refills.   empagliflozin (JARDIANCE) 10 MG TABS tablet Take 1 tablet (10 mg total) by mouth daily.   gabapentin (NEURONTIN) 300 MG capsule Take 1 capsule (300 mg total) by mouth at bedtime.   ibuprofen (ADVIL) 800 MG tablet Take 1 tablet (800 mg total) by mouth 3 (three) times daily.   Lancets (ONETOUCH DELICA PLUS [id number]) MISC USE AS DIRECTED. TAKE BLOOD SUGAR 2 -3 TIMES PER WEEK BEFORE MEALS.   meclizine (ANTIVERT) 25 MG tablet Take 1 tablet (25 mg total) by mouth 3 (three) times daily as needed.   metoprolol succinate (TOPROL-XL) 25 MG 24 hr tablet Take 1 tablet (25 mg total) by mouth daily. Please keep upcoming appointment in August 2022 for future refills. Thank you   omeprazole (PRILOSEC) 20 MG capsule TAKE 1 CAPSULE BY MOUTH DAILY AS NEEDED.   guaiFENesin-dextromethorphan (ROBITUSSIN DM) 100-10 MG/5ML syrup Take 5 mLs by mouth every 4 (four) hours as needed for cough. (Patient not taking: Reported on 05/26/2021)   No facility-administered encounter medications on file as of 05/26/2021.    Allergies (verified) Patient has no known allergies.   History: Past Medical History:  Diagnosis Date   Allergic rhinitis  05/07/2014   Dizziness 01/02/2016   GERD (gastroesophageal reflux disease)    Headache 03/13/2015   Left flank pain 01/02/2016   Low back pain 06/06/2015   Pain of left side of body 08/13/2015   Post herpetic neuralgia    Routine general medical examination at a health care facility 05/07/2014   Seizure disorder ([place]) 05/07/2014   Seizures ([place])    Last seizure in the 70s.   Shingles    Stye  05/07/2014   Past Surgical History:  Procedure Laterality Date   COLONOSCOPY     KNEE SURGERY Left    left miniscus   UPPER GASTROINTESTINAL ENDOSCOPY     uvala surgery     Family History  Problem Relation Age of Onset   Diabetes Father    Hypertension Father    Other Mother        natural causes   Social History   Socioeconomic History   Marital status: Single    Spouse name: Not on file   Number of children: 3   Years of education: Associates   Highest education level: Not on file  Occupational History   Occupation: Retired    Comment: Oceanographer  Tobacco Use   Smoking status: Former   Smokeless tobacco: Never  Scientific laboratory technician Use: Never used  Substance and Sexual Activity   Alcohol use: Yes    Alcohol/week: 0.0 standard drinks    Comment: occasional   Drug use: No   Sexual activity: Not on file  Other Topics Concern   Not on file  Social History Narrative   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   Right-handed.   Rarely drinks caffeine.   Social Determinants of Health   Financial Resource Strain: Low Risk    Difficulty of Paying Living Expenses: Not hard at all  Food Insecurity: No Food Insecurity   Worried About Charity fundraiser in the Last Year: Never true   [place] in the Last Year: Never true  Transportation Needs: No Transportation Needs   Lack of Transportation (Medical): No   Lack of Transportation (Non-Medical): No  Physical Activity: Sufficiently Active   Days of Exercise per Week: 2 days   Minutes of Exercise per Session: 100 min  Stress: No Stress Concern Present   Feeling of Stress : Not at all  Social Connections: Moderately Integrated   Frequency of Communication with Friends and Family: Twice a week   Frequency of Social Gatherings with Friends and Family: Twice a week   Attends Religious Services: More than 4 times per  year   Active Member of [employer] or Organizations: No   Attends Music therapist: Never   Marital Status: Married    Tobacco Counseling Counseling given: Not Answered   Clinical Intake:  Pre-visit preparation completed: Yes  Pain : No/denies pain     Nutritional Risks: None Diabetes: Yes CBG done?: No Did pt. bring in CBG monitor from home?: No  How often do you need to have someone help you when you read instructions, pamphlets, or other written materials from your doctor or pharmacy?: 1 - Never What is the last grade level you completed in school?: college  Diabetic?yes Nutrition Risk Assessment:  Has the patient had any N/V/D within the last 2 months?  No  Does the patient have any non-healing wounds?  No  Has the patient had any unintentional weight loss or weight gain?  No   Diabetes:  Is the patient diabetic?  Yes  If diabetic, was a CBG obtained today?  No  Did the patient bring in their glucometer from home?  No  How often do you monitor your CBG's? never.   Financial Strains and Diabetes Management:  Are you having any financial strains with the device, your supplies or your medication? No .  Does the patient want to be seen by Chronic Care Management for management of their diabetes?  No  Would the patient like to be referred to a Nutritionist or for Diabetic Management?  No   Diabetic Exams:  Diabetic Eye Exam: Completed 01/2021 Diabetic Foot Exam: Overdue, Pt has been advised about the importance in completing this exam. Pt is scheduled for diabetic foot exam on next office visit .   Interpreter Needed?: No  Information entered by :: [place] of Daily Living In your present state of health, do you have any difficulty performing the following activities: 05/26/2021  Hearing? N  Vision? N  Difficulty concentrating or making decisions? N  Walking or climbing stairs? N  Dressing or bathing? N  Doing errands, shopping? N   Preparing Food and eating ? N  Using the Toilet? N  In the past six months, have you accidently leaked urine? N  Do you have problems with loss of bowel control? N  Managing your Medications? N  Managing your Finances? N  Housekeeping or managing your Housekeeping? N  Some recent data might be hidden    Patient Care Team: Nche, Charlene Brooke, NP as PCP - General (Internal Medicine) Belva Crome, MD as PCP - Cardiology (Cardiology) Germaine Pomfret, [facility] as Pharmacist (Pharmacist)  Indicate any recent Medical Services you may have received from other than [facility] providers in the past year (date may be approximate).     Assessment:   This is a routine wellness examination for Gurkaran.  Hearing/Vision screen No results found.  Dietary issues and exercise activities discussed: Current Exercise Habits: Home exercise routine, Type of exercise: Other - see comments (dancing), Time (Minutes): 60, Frequency (Times/Week): 2, Weekly Exercise (Minutes/Week): 120, Intensity: Mild, Exercise limited by: None identified   Goals Addressed   None    Depression Screen PHQ 2/9 Scores 05/26/2021 05/26/2021 01/01/2021 01/02/2020 10/08/2019 08/16/2018 12/29/2017  PHQ - 2 Score 0 0 0 0 0 0 0    Fall Risk Fall Risk  05/26/2021 01/01/2021 11/04/2020 07/18/2020 06/10/2020  Falls in the past year? 0 0 0 0 0  Number falls in past yr: 0 0 0 0 0  Injury with Fall? 0 - 0 0 0  Risk for fall due to : - - - - No Fall Risks  Follow up Falls evaluation completed - - - Falls evaluation completed    FALL RISK PREVENTION PERTAINING TO THE HOME:  Any stairs in or around the home? Yes  If so, are there any without handrails? No  Home free of loose throw rugs in walkways, pet beds, electrical cords, etc? Yes  Adequate lighting in your home to reduce risk of falls? Yes   ASSISTIVE DEVICES UTILIZED TO PREVENT FALLS:  Life alert? No  Use of a cane, walker or w/c? No  Grab bars in the bathroom? No  Shower chair or bench  in shower? No  Elevated toilet seat or a handicapped toilet? No    Cognitive Function:  Normal cognitive status assessed by direct observation by this Nurse Health Advisor. No abnormalities found.        Immunizations Immunization History  Administered Date(s) Administered   Fluad Quad(high Dose 65+) 02/01/2019, 02/26/2020, 03/02/2021   Influenza Whole 02/21/2014   Influenza, High Dose Seasonal PF 02/03/2016, 02/03/2017, 02/28/2018   Influenza,inj,Quad PF,6+ Mos 03/13/2015   PFIZER(Purple Top)SARS-COV-2 Vaccination 07/22/2019, 08/21/2019, 03/03/2020   Pneumococcal Conjugate-13 05/06/2015   Pneumococcal Polysaccharide-23 05/07/2014   Tdap 05/25/2011   Zoster Recombinat (Shingrix) 12/03/2016, 02/16/2017    TDAP status: Due, Education has been provided regarding the importance of this vaccine. Advised may receive this vaccine at local pharmacy or Health Dept. Aware to provide a copy of the vaccination record if obtained from local pharmacy or Health Dept. Verbalized acceptance and understanding.  Flu Vaccine status: Up to date  Pneumococcal vaccine status: Up to date  Covid-19 vaccine status: Completed vaccines  Qualifies for Shingles Vaccine? Yes   Zostavax completed Yes   Shingrix Completed?: Yes  Screening Tests Health Maintenance  Topic Date Due   COVID-19 Vaccine (4 - Booster for Pfizer series) 04/28/2020   TETANUS/TDAP  05/24/2021   HEMOGLOBIN A1C  08/31/2021   OPHTHALMOLOGY EXAM  01/23/2022   URINE MICROALBUMIN  02/10/2022   FOOT EXAM  03/02/2022   COLONOSCOPY (Pts 45-60yrs Insurance coverage will need to be confirmed)  01/23/2024   Pneumonia Vaccine 80+ Years old  Completed   INFLUENZA VACCINE  Completed   Hepatitis C Screening  Completed   Zoster Vaccines- Shingrix  Completed   HPV VACCINES  Aged Out    Health Maintenance  Health Maintenance Due  Topic Date Due   COVID-19 Vaccine (4 - Booster for [place] series) 04/28/2020   TETANUS/TDAP  05/24/2021     Colorectal cancer screening: Type of screening: Colonoscopy. Completed 02/01/2014. Repeat every 10 years  Lung Cancer Screening: (Low Dose CT Chest recommended if Age 74-80 years, 30 pack-year currently smoking OR have quit w/in 15years.) does not qualify.   Lung Cancer Screening Referral: n/a  Additional Screening:  Hepatitis C Screening: does not qualify;   Vision Screening: Recommended annual ophthalmology exams for early detection of glaucoma and other disorders of the eye. Is the patient up to date with their annual eye exam?  Yes  Who is the provider or what is the name of the office in which the patient attends annual eye exams? Dr.Heackler  If pt is not established with a provider, would they like to be referred to a provider to establish care? No .   Dental Screening: Recommended annual dental exams for proper oral hygiene  Community Resource Referral / Chronic Care Management: CRR required this visit?  No   CCM required this visit?  No      Plan:     I have personally reviewed and noted the following in the patients chart:   Medical and social history Use of alcohol, tobacco or illicit drugs  Current medications and supplements including opioid prescriptions. Patient is not currently taking opioid prescriptions. Functional ability and status Nutritional status Physical activity Advanced directives List of other physicians Hospitalizations, surgeries, and ER visits in previous 12 months Vitals Screenings to include cognitive, depression, and falls Referrals and appointments  In addition, I have reviewed and discussed with patient certain preventive protocols, quality metrics, and best practice recommendations. A written personalized care plan for preventive services as well as general preventive health recommendations were provided to patient.     Randel Pigg, LPN  05/26/2021   Nurse Notes: none

## 2021-05-26 NOTE — Patient Instructions (Signed)
Nicholas Rasmussen , Thank you for taking time to come for your Medicare Wellness Visit. I appreciate your ongoing commitment to your health goals. Please review the following plan we discussed and let me know if I can assist you in the future.   Screening recommendations/referrals: Colonoscopy: 01/22/2014 Recommended yearly ophthalmology/optometry visit for glaucoma screening and checkup Recommended yearly dental visit for hygiene and checkup  Vaccinations: Influenza vaccine: completed  Pneumococcal vaccine: completed  Tdap vaccine: due  Shingles vaccine: completed     Advanced directives: yes   Conditions/risks identified: none   Next appointment: none   Preventive Care 74 Years and Older, Male Preventive care refers to lifestyle choices and visits with your health care provider that can promote health and wellness. What does preventive care include? A yearly physical exam. This is also called an annual well check. Dental exams once or twice a year. Routine eye exams. Ask your health care provider how often you should have your eyes checked. Personal lifestyle choices, including: Daily care of your teeth and gums. Regular physical activity. Eating a healthy diet. Avoiding tobacco and drug use. Limiting alcohol use. Practicing safe sex. Taking low doses of aspirin every day. Taking vitamin and mineral supplements as recommended by your health care provider. What happens during an annual well check? The services and screenings done by your health care provider during your annual well check will depend on your age, overall health, lifestyle risk factors, and family history of disease. Counseling  Your health care provider may ask you questions about your: Alcohol use. Tobacco use. Drug use. Emotional well-being. Home and relationship well-being. Sexual activity. Eating habits. History of falls. Memory and ability to understand (cognition). Work and work  Statistician. Screening  You may have the following tests or measurements: Height, weight, and BMI. Blood pressure. Lipid and cholesterol levels. These may be checked every 5 years, or more frequently if you are over 65 years old. Skin check. Lung cancer screening. You may have this screening every year starting at age 52 if you have a 30-pack-year history of smoking and currently smoke or have quit within the past 15 years. Fecal occult blood test (FOBT) of the stool. You may have this test every year starting at age 8. Flexible sigmoidoscopy or colonoscopy. You may have a sigmoidoscopy every 5 years or a colonoscopy every 10 years starting at age 27. Prostate cancer screening. Recommendations will vary depending on your family history and other risks. Hepatitis C blood test. Hepatitis B blood test. Sexually transmitted disease (STD) testing. Diabetes screening. This is done by checking your blood sugar (glucose) after you have not eaten for a while (fasting). You may have this done every 1-3 years. Abdominal aortic aneurysm (AAA) screening. You may need this if you are a current or former smoker. Osteoporosis. You may be screened starting at age 55 if you are at high risk. Talk with your health care provider about your test results, treatment options, and if necessary, the need for more tests. Vaccines  Your health care provider may recommend certain vaccines, such as: Influenza vaccine. This is recommended every year. Tetanus, diphtheria, and acellular pertussis (Tdap, Td) vaccine. You may need a Td booster every 10 years. Zoster vaccine. You may need this after age 48. Pneumococcal 13-valent conjugate (PCV13) vaccine. One dose is recommended after age 16. Pneumococcal polysaccharide (PPSV23) vaccine. One dose is recommended after age 64. Talk to your health care provider about which screenings and vaccines you need and how often you  need them. This information is not intended to replace  advice given to you by your health care provider. Make sure you discuss any questions you have with your health care provider. Document Released: 06/06/2015 Document Revised: 01/28/2016 Document Reviewed: 03/11/2015 Elsevier Interactive Patient Education  2017 [place] Prevention in the Home Falls can cause injuries. They can happen to people of all ages. There are many things you can do to make your home safe and to help prevent falls. What can I do on the outside of my home? Regularly fix the edges of walkways and driveways and fix any cracks. Remove anything that might make you trip as you walk through a door, such as a raised step or threshold. Trim any bushes or trees on the path to your home. Use bright outdoor lighting. Clear any walking paths of anything that might make someone trip, such as rocks or tools. Regularly check to see if handrails are loose or broken. Make sure that both sides of any steps have handrails. Any raised decks and porches should have guardrails on the edges. Have any leaves, snow, or ice cleared regularly. Use sand or salt on walking paths during winter. Clean up any spills in your garage right away. This includes oil or grease spills. What can I do in the bathroom? Use night lights. Install grab bars by the toilet and in the tub and shower. Do not use towel bars as grab bars. Use non-skid mats or decals in the tub or shower. If you need to sit down in the shower, use a plastic, non-slip stool. Keep the floor dry. Clean up any water that spills on the floor as soon as it happens. Remove soap buildup in the tub or shower regularly. Attach bath mats securely with double-sided non-slip rug tape. Do not have throw rugs and other things on the floor that can make you trip. What can I do in the bedroom? Use night lights. Make sure that you have a light by your bed that is easy to reach. Do not use any sheets or blankets that are too big for your bed.  They should not hang down onto the floor. Have a firm chair that has side arms. You can use this for support while you get dressed. Do not have throw rugs and other things on the floor that can make you trip. What can I do in the kitchen? Clean up any spills right away. Avoid walking on wet floors. Keep items that you use a lot in easy-to-reach places. If you need to reach something above you, use a strong step stool that has a grab bar. Keep electrical cords out of the way. Do not use floor polish or wax that makes floors slippery. If you must use wax, use non-skid floor wax. Do not have throw rugs and other things on the floor that can make you trip. What can I do with my stairs? Do not leave any items on the stairs. Make sure that there are handrails on both sides of the stairs and use them. Fix handrails that are broken or loose. Make sure that handrails are as long as the stairways. Check any carpeting to make sure that it is firmly attached to the stairs. Fix any carpet that is loose or worn. Avoid having throw rugs at the top or bottom of the stairs. If you do have throw rugs, attach them to the floor with carpet tape. Make sure that you have a light switch  at the top of the stairs and the bottom of the stairs. If you do not have them, ask someone to add them for you. What else can I do to help prevent falls? Wear shoes that: Do not have high heels. Have rubber bottoms. Are comfortable and fit you well. Are closed at the toe. Do not wear sandals. If you use a stepladder: Make sure that it is fully opened. Do not climb a closed stepladder. Make sure that both sides of the stepladder are locked into place. Ask someone to hold it for you, if possible. Clearly mark and make sure that you can see: Any grab bars or handrails. First and last steps. Where the edge of each step is. Use tools that help you move around (mobility aids) if they are needed. These  include: Canes. Walkers. Scooters. Crutches. Turn on the lights when you go into a dark area. Replace any light bulbs as soon as they burn out. Set up your furniture so you have a clear path. Avoid moving your furniture around. If any of your floors are uneven, fix them. If there are any pets around you, be aware of where they are. Review your medicines with your doctor. Some medicines can make you feel dizzy. This can increase your chance of falling. Ask your doctor what other things that you can do to help prevent falls. This information is not intended to replace advice given to you by your health care provider. Make sure you discuss any questions you have with your health care provider. Document Released: 03/06/2009 Document Revised: 10/16/2015 Document Reviewed: 06/14/2014 Elsevier Interactive Patient Education  2017 [employer].

## 2021-05-27 ENCOUNTER — Emergency Department (HOSPITAL_BASED_OUTPATIENT_CLINIC_OR_DEPARTMENT_OTHER)
Admission: EM | Admit: 2021-05-27 | Discharge: 2021-05-27 | Disposition: A | Discharge status: Home / Self Care | Payer: PPO | Attending: Emergency Medicine | Admitting: Emergency Medicine

## 2021-05-27 ENCOUNTER — Encounter (HOSPITAL_BASED_OUTPATIENT_CLINIC_OR_DEPARTMENT_OTHER): Payer: Self-pay | Admitting: *Deleted

## 2021-05-27 ENCOUNTER — Other Ambulatory Visit (HOSPITAL_BASED_OUTPATIENT_CLINIC_OR_DEPARTMENT_OTHER): Payer: Self-pay

## 2021-05-27 DIAGNOSIS — H5789 Other specified disorders of eye and adnexa: Secondary | ICD-10-CM | POA: Diagnosis present

## 2021-05-27 DIAGNOSIS — R0981 Nasal congestion: Secondary | ICD-10-CM | POA: Diagnosis not present

## 2021-05-27 DIAGNOSIS — R059 Cough, unspecified: Secondary | ICD-10-CM | POA: Diagnosis not present

## 2021-05-27 DIAGNOSIS — Z20822 Contact with and (suspected) exposure to covid-19: Secondary | ICD-10-CM | POA: Insufficient documentation

## 2021-05-27 DIAGNOSIS — H1031 Unspecified acute conjunctivitis, right eye: Secondary | ICD-10-CM | POA: Diagnosis not present

## 2021-05-27 LAB — RESP PANEL BY RT-PCR (FLU A&B, COVID) ARPGX2
Influenza A by PCR: NEGATIVE
Influenza B by PCR: NEGATIVE
SARS Coronavirus 2 by RT PCR: NEGATIVE

## 2021-05-27 MED ORDER — TETRACAINE HCL 0.5 % OP SOLN
2.0000 [drp] | Freq: Once | OPHTHALMIC | Status: AC
  Administered 2021-05-27: 2 [drp] via OPHTHALMIC
  Filled 2021-05-27: qty 4

## 2021-05-27 MED ORDER — ERYTHROMYCIN 5 MG/GM OP OINT
TOPICAL_OINTMENT | OPHTHALMIC | 0 refills | Status: DC
  Filled 2021-05-27: qty 3.5, 7d supply, fill #0

## 2021-05-27 MED ORDER — FLUORESCEIN SODIUM 1 MG OP STRP
1.0000 | ORAL_STRIP | Freq: Once | OPHTHALMIC | Status: AC
  Administered 2021-05-27: 1 via OPHTHALMIC
  Filled 2021-05-27: qty 1

## 2021-05-27 NOTE — ED Provider Notes (Signed)
[facility] EMERGENCY DEPARTMENT Provider Note   CSN: [id number] Arrival date & time: 05/27/21  1031     History  Chief Complaint  Patient presents with   Eye Problem    Nicholas Rasmussen is a 76 y.o. male.  HPI     Woke up with pink colored eye on the right, drainage from eye, clar-to yellow> minimal irritation. No trauma or exposures to eye. Has opto, just reading glasses Has some congestion typically gets this time of year No fevers, body aches Mild cough  Thinks allergic    Home Medications Prior to Admission medications   Medication Sig Start Date End Date Taking? Authorizing Provider  erythromycin ophthalmic ointment Place a 1/2 inch ribbon of ointment into the lower eyelid four times a day for one week. 05/27/21  Yes Gareth Morgan, MD  acetaminophen (TYLENOL) 500 MG tablet Take 1 tablet (500 mg total) by mouth every 8 (eight) hours as needed. 11/23/17   Nche, Charlene Brooke, NP  acyclovir cream (ZOVIRAX) 5 % Apply 1 application topically every 6 (six) hours as needed. 04/10/21   Nche, Charlene Brooke, NP  atorvastatin (LIPITOR) 20 MG tablet Take 1 tablet (20 mg total) by mouth daily at 6 PM. 04/07/21   Nche, Charlene Brooke, NP  divalproex (DEPAKOTE ER) 500 MG 24 hr tablet Take 1 tablet (500 mg total) by mouth at bedtime. Please call [phone] for appt or you may speak to your PCP about refills. 03/31/20   Marcial Pacas, MD  empagliflozin (JARDIANCE) 10 MG TABS tablet Take 1 tablet (10 mg total) by mouth daily. 04/07/21   Nche, Charlene Brooke, NP  gabapentin (NEURONTIN) 300 MG capsule Take 1 capsule (300 mg total) by mouth at bedtime. 03/31/20   Marcial Pacas, MD  guaiFENesin-dextromethorphan (ROBITUSSIN DM) 100-10 MG/5ML syrup Take 5 mLs by mouth every 4 (four) hours as needed for cough. Patient not taking: Reported on 05/26/2021 01/08/21   Hans Eden, NP  ibuprofen (ADVIL) 800 MG tablet Take 1 tablet (800 mg total) by mouth 3 (three) times daily. 01/08/21   White, Leitha Schuller, NP  Lancets (ONETOUCH DELICA PLUS [id number]) MISC USE AS DIRECTED. TAKE BLOOD SUGAR 2 -3 TIMES PER WEEK BEFORE MEALS. 12/29/19   Nche, Charlene Brooke, NP  meclizine (ANTIVERT) 25 MG tablet Take 1 tablet (25 mg total) by mouth 3 (three) times daily as needed. 04/03/21   Nche, Charlene Brooke, NP  metoprolol succinate (TOPROL-XL) 25 MG 24 hr tablet Take 1 tablet (25 mg total) by mouth daily. Please keep upcoming appointment in August 2022 for future refills. Thank you 10/06/20   Belva Crome, MD  omeprazole (PRILOSEC) 20 MG capsule TAKE 1 CAPSULE BY MOUTH DAILY AS NEEDED. 01/12/21   Nche, Charlene Brooke, NP      Allergies    Patient has no known allergies.    Review of Systems   Review of Systems  Physical Exam Updated Vital Signs BP 129/79    Pulse (!) 47    Temp 98.2 F (36.8 C) (Oral)    Resp 17    Ht 6\' 4"  (1.93 m)    Wt 108 kg    SpO2 98%    BMI 28.97 kg/m  Physical Exam Vitals and nursing note reviewed.  Constitutional:      General: He is not in acute distress.    Appearance: Normal appearance. He is not ill-appearing, toxic-appearing or diaphoretic.  HENT:     Head: Normocephalic.  Eyes:  General: No visual field deficit.       Right eye: Discharge present.     Intraocular pressure: Right eye pressure is 17 mmHg.     Extraocular Movements: Extraocular movements intact.     Right eye: Normal extraocular motion.     Conjunctiva/sclera:     Right eye: Right conjunctiva is injected.     Pupils: Pupils are equal, round, and reactive to light.     Right eye: No fluorescein uptake.     Slit lamp exam:    Right eye: No foreign body, hyphema, hypopyon or photophobia.  Cardiovascular:     Rate and Rhythm: Normal rate and regular rhythm.     Pulses: Normal pulses.  Pulmonary:     Effort: Pulmonary effort is normal. No respiratory distress.  Musculoskeletal:        General: No deformity or signs of injury.     Cervical back: No rigidity.  Skin:    General: Skin is warm and  dry.     Coloration: Skin is not jaundiced or pale.  Neurological:     General: No focal deficit present.     Mental Status: He is alert and oriented to person, place, and time.    ED Results / Procedures / Treatments   Labs (all labs ordered are listed, but only abnormal results are displayed) Labs Reviewed  RESP PANEL BY RT-PCR (FLU A&B, COVID) [id number]    EKG None  Radiology No results found.  Procedures Procedures    Medications Ordered in ED Medications  tetracaine (PONTOCAINE) 0.5 % ophthalmic solution 2 drop (2 drops Both Eyes Given by Other 05/27/21 1300)  fluorescein ophthalmic strip 1 strip (1 strip Right Eye Given by Other 05/27/21 1300)    ED Course/ Medical Decision Making/ A&P                           Medical Decision Making  76yo male with history of seizure disorder presents with right eye redness, discharge and irritation.    No injuries, no signs of orbital or periorbital cellulitis, globe rupture, glaucoma, corneal abrasion, doubt iritis.   Exam and hx consistent with conjunctivitis, possible allergic, viral but given discharge will treat as possible bacterial. Also has very tiny area of follicular irritation, possible early stye. Recommend compresses, given erythromycin ointment.  COVID swab done given mild associated URI symptoms v allergies.  Recommend follow up with eye doctor. Patient discharged in stable condition with understanding of reasons to return.         Final Clinical Impression(s) / ED Diagnoses Final diagnoses:  Acute conjunctivitis of right eye, unspecified acute conjunctivitis type    Rx / DC Orders ED Discharge Orders          Ordered    erythromycin ophthalmic ointment        05/27/21 1311              Gareth Morgan, MD 05/28/21 0030

## 2021-05-27 NOTE — ED Triage Notes (Signed)
C/o rt eye redness when woke this am  slight drainage

## 2021-05-27 NOTE — ED Notes (Signed)
ED Provider at bedside. 

## 2021-05-27 NOTE — ED Notes (Signed)
D/c paperwork reviewed with pt, including prescription.  Pt without questions or concerns at time of d/c, ambulatory to ED exit without assistance.

## 2021-06-05 ENCOUNTER — Telehealth: Payer: Self-pay

## 2021-06-05 ENCOUNTER — Other Ambulatory Visit: Payer: Self-pay

## 2021-06-05 ENCOUNTER — Ambulatory Visit (INDEPENDENT_AMBULATORY_CARE_PROVIDER_SITE_OTHER): Payer: PPO | Admitting: Nurse Practitioner

## 2021-06-05 ENCOUNTER — Ambulatory Visit (INDEPENDENT_AMBULATORY_CARE_PROVIDER_SITE_OTHER)
Admission: RE | Admit: 2021-06-05 | Discharge: 2021-06-05 | Disposition: A | Discharge status: Home / Self Care | Payer: PPO | Source: Ambulatory Visit | Attending: Nurse Practitioner | Admitting: Nurse Practitioner

## 2021-06-05 VITALS — BP 112/58 | HR 52 | Temp 97.9°F | Resp 10 | Ht 76.0 in | Wt 243.0 lb

## 2021-06-05 DIAGNOSIS — N644 Mastodynia: Secondary | ICD-10-CM

## 2021-06-05 DIAGNOSIS — Z8616 Personal history of COVID-19: Secondary | ICD-10-CM | POA: Insufficient documentation

## 2021-06-05 DIAGNOSIS — R0789 Other chest pain: Secondary | ICD-10-CM | POA: Diagnosis not present

## 2021-06-05 DIAGNOSIS — R0602 Shortness of breath: Secondary | ICD-10-CM | POA: Diagnosis not present

## 2021-06-05 DIAGNOSIS — R21 Rash and other nonspecific skin eruption: Secondary | ICD-10-CM

## 2021-06-05 DIAGNOSIS — M545 Low back pain, unspecified: Secondary | ICD-10-CM

## 2021-06-05 DIAGNOSIS — G8929 Other chronic pain: Secondary | ICD-10-CM

## 2021-06-05 LAB — BASIC METABOLIC PANEL
BUN: 16 mg/dL (ref 6–23)
CO2: 29 mEq/L (ref 19–32)
Calcium: 8.8 mg/dL (ref 8.4–10.5)
Chloride: 105 mEq/L (ref 96–112)
Creatinine, Ser: 1.1 mg/dL (ref 0.40–1.50)
GFR: 66.74 mL/min (ref >60.00)
Glucose, Bld: 91 mg/dL (ref 70–99)
Potassium: 4 mEq/L (ref 3.5–5.1)
Sodium: 139 mEq/L (ref 135–145)

## 2021-06-05 LAB — CBC
HCT: 40.5 % (ref 39.0–52.0)
Hemoglobin: 12.9 g/dL — ABNORMAL LOW (ref 13.0–17.0)
MCHC: 31.8 g/dL (ref 30.0–36.0)
MCV: 83 fl (ref 78.0–100.0)
Platelets: 200 10*3/uL (ref 150.0–400.0)
RBC: 4.88 Mil/uL (ref 4.22–5.81)
RDW: 14.8 % (ref 11.5–15.5)
WBC: 4.9 10*3/uL (ref 4.0–10.5)

## 2021-06-05 MED ORDER — ACYCLOVIR 200 MG PO CAPS: 200.0000 mg | ORAL_CAPSULE | Freq: Every day | ORAL | 1 refills | Status: DC

## 2021-06-05 NOTE — Assessment & Plan Note (Signed)
Patient having a discomfort right-sided chest seems more superficial nerve related.  Given patient's age and CAD will obtain EKG and chest x-ray.  EKG within normal limits.  Pending chest x-ray and lab results

## 2021-06-05 NOTE — Telephone Encounter (Signed)
Patient is asking to have referral for PT put in for his lower back again, he had this done in 2021 and it was sent to [facility] location with [facility]. He is still having same issues and it helped last time. Patient states PCP is aware of this. Advised patient he may need an office visit for this with his PCP. Thank you

## 2021-06-05 NOTE — Assessment & Plan Note (Signed)
Recent shortness of breath as recent as last night.  States he has not been breathing same since COVID back in August 2022.  EKG within normal limits pending chest x-ray and lab.  Continue to monitor signs and symptoms reviewed when to seek urgent or emergent health care.

## 2021-06-05 NOTE — Patient Instructions (Addendum)
Nice to see you today I will be in touch with the labs and xray results Not sure as to what is causing the discomfort you are experiencing  When it happens try to take note as to what you are doing when it happens. If it continues follow up with [facility]

## 2021-06-05 NOTE — Assessment & Plan Note (Signed)
Unable to feel or palpate mass in the right sided chest.  Unable to elicit pain with me pressing or patient pressing.  Patient able to move around to his torso without eliciting pain.  Continue to monitor

## 2021-06-05 NOTE — Telephone Encounter (Signed)
Patient would like to know if he can have Acyclovir in tablet form instead of the cream form he has been using? Thank you

## 2021-06-05 NOTE — Progress Notes (Signed)
Acute Office Visit  Subjective:    Patient ID: Alisha Burgo, male    DOB: 27-Jul-1945, 76 y.o.   MRN: [id number]  Chief Complaint  Patient presents with   Breast Pain    Off and on for about 2 weeks. Gets a "stinging" sensation in the right breast area, sensation lasts for a few seconds/minutes. Usually with movement/certain way of stretching his body. Not at rest usually.    HPI Patient is in today for Breast pain  States it started approx 2 weeks ago described as a bee sting/bite feeling on the right side of the chest near the breast. Sometime he can reproduce it with palpation. States that he does not think movement makes it worse. He feels it more at rest.  Has not tried any medications other than extra strength tylenol which does not seem to make a difference  States he had covid back in August last year and his breathing hasnt been right since. States last night he had a couple episodes where he could not catch his breath while sitting up in his chair. Has resolved. No history of blood clot per report  Past Medical History:  Diagnosis Date   Allergic rhinitis 05/07/2014   Dizziness 01/02/2016   GERD (gastroesophageal reflux disease)    Headache 03/13/2015   Left flank pain 01/02/2016   Low back pain 06/06/2015   Pain of left side of body 08/13/2015   Post herpetic neuralgia    Routine general medical examination at a health care facility 05/07/2014   Seizure disorder ([place]) 05/07/2014   Seizures ([place])    Last seizure in the 70s.   Shingles    Stye 05/07/2014    Past Surgical History:  Procedure Laterality Date   COLONOSCOPY     KNEE SURGERY Left    left miniscus   UPPER GASTROINTESTINAL ENDOSCOPY     uvala surgery      Family History  Problem Relation Age of Onset   Diabetes Father    Hypertension Father    Other Mother        natural causes    Social History   Socioeconomic History   Marital status: Single    Spouse name: Not on file   Number of  children: 3   Years of education: Associates   Highest education level: Not on file  Occupational History   Occupation: Retired    Comment: Oceanographer  Tobacco Use   Smoking status: Former   Smokeless tobacco: Never  Scientific laboratory technician Use: Never used  Substance and Sexual Activity   Alcohol use: Yes    Alcohol/week: 0.0 standard drinks    Comment: occasional   Drug use: No   Sexual activity: Not on file  Other Topics Concern   Not on file  Social History Narrative   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   Right-handed.   Rarely drinks caffeine.   Social Determinants of Health   Financial Resource Strain: Low Risk    Difficulty of Paying Living Expenses: Not hard at all  Food Insecurity: No Food Insecurity   Worried About Charity fundraiser in the Last Year: Never true   [place] in the Last Year: Never true  Transportation Needs: No Transportation Needs   Lack of Transportation (Medical): No   Lack of Transportation (Non-Medical): No  Physical  Activity: Sufficiently Active   Days of Exercise per Week: 2 days   Minutes of Exercise per Session: 100 min  Stress: No Stress Concern Present   Feeling of Stress : Not at all  Social Connections: Moderately Integrated   Frequency of Communication with Friends and Family: Twice a week   Frequency of Social Gatherings with Friends and Family: Twice a week   Attends Religious Services: More than 4 times per year   Active Member of [employer] or Organizations: No   Attends Music therapist: Never   Marital Status: Married  Human resources officer Violence: Not At Risk   Fear of Current or Ex-Partner: No   Emotionally Abused: No   Physically Abused: No   Sexually Abused: No    Outpatient Medications Prior to Visit  Medication Sig Dispense Refill   acetaminophen (TYLENOL) 500 MG tablet Take 1 tablet (500 mg total)  by mouth every 8 (eight) hours as needed. 30 tablet 0   acyclovir cream (ZOVIRAX) 5 % Apply 1 application topically every 6 (six) hours as needed. 5 g 3   atorvastatin (LIPITOR) 20 MG tablet Take 1 tablet (20 mg total) by mouth daily at 6 PM. 90 tablet 1   divalproex (DEPAKOTE ER) 500 MG 24 hr tablet Take 1 tablet (500 mg total) by mouth at bedtime. Please call [phone] for appt or you may speak to your PCP about refills. 90 tablet 4   empagliflozin (JARDIANCE) 10 MG TABS tablet Take 1 tablet (10 mg total) by mouth daily. 90 tablet 1   gabapentin (NEURONTIN) 300 MG capsule Take 1 capsule (300 mg total) by mouth at bedtime. 90 capsule 4   ibuprofen (ADVIL) 800 MG tablet Take 1 tablet (800 mg total) by mouth 3 (three) times daily. 21 tablet 0   Lancets (ONETOUCH DELICA PLUS [id number]) MISC USE AS DIRECTED. TAKE BLOOD SUGAR 2 -3 TIMES PER WEEK BEFORE MEALS. 100 each 3   meclizine (ANTIVERT) 25 MG tablet Take 1 tablet (25 mg total) by mouth 3 (three) times daily as needed. 10 tablet 0   metoprolol succinate (TOPROL-XL) 25 MG 24 hr tablet Take 1 tablet (25 mg total) by mouth daily. Please keep upcoming appointment in August 2022 for future refills. Thank you 90 tablet 1   omeprazole (PRILOSEC) 20 MG capsule TAKE 1 CAPSULE BY MOUTH DAILY AS NEEDED. 90 capsule 1   erythromycin ophthalmic ointment Place a 1/2 inch ribbon of ointment into the lower eyelid four times a day for one week. 3.5 g 0   guaiFENesin-dextromethorphan (ROBITUSSIN DM) 100-10 MG/5ML syrup Take 5 mLs by mouth every 4 (four) hours as needed for cough. (Patient not taking: Reported on 05/26/2021) 118 mL 0   No facility-administered medications prior to visit.    No Known Allergies  Review of Systems  Constitutional:  Positive for fatigue. Negative for chills and fever.  HENT:  Positive for postnasal drip.   Respiratory:  Positive for cough and shortness of breath (last night twice).   Cardiovascular:  Positive for chest pain. Negative  for leg swelling.  Gastrointestinal:  Negative for abdominal pain, diarrhea, nausea and vomiting.  Neurological:  Negative for weakness and numbness.      Objective:    Physical Exam Vitals and nursing note reviewed.  Constitutional:      Appearance: Normal appearance.  Cardiovascular:     Rate and Rhythm: Normal rate and regular rhythm.  Pulmonary:     Effort: Pulmonary effort is normal.  Breath sounds: Normal breath sounds.  Abdominal:     General: Bowel sounds are normal.  Musculoskeletal:     Right lower leg: No edema.     Left lower leg: No edema.  Skin:    General: Skin is warm.       Neurological:     Mental Status: He is alert.    BP (!) 112/58    Pulse (!) 52    Temp 97.9 F (36.6 C)    Resp 10    Ht $R'6\' 4"'Lg$  (1.93 m)    Wt 243 lb (110.2 kg)    SpO2 95%    BMI 29.58 kg/m  Wt Readings from Last 3 Encounters:  06/05/21 243 lb (110.2 kg)  05/27/21 238 lb (108 kg)  04/03/21 240 lb 9.6 oz (109.1 kg)    Health Maintenance Due  Topic Date Due   COVID-19 Vaccine (4 - Booster for Pfizer series) 04/28/2020   TETANUS/TDAP  05/24/2021    There are no preventive care reminders to display for this patient.   Lab Results  Component Value Date   TSH 1.25 10/08/2019   Lab Results  Component Value Date   WBC 5.0 10/31/2019   HGB 12.3 (L) 10/31/2019   HCT 37.6 (L) 10/31/2019   MCV 83.5 10/31/2019   PLT 187.0 10/31/2019   Lab Results  Component Value Date   NA 141 02/10/2021   K 3.8 02/10/2021   CO2 31 02/10/2021   GLUCOSE 102 (H) 02/10/2021   BUN 17 02/10/2021   CREATININE 1.18 02/10/2021   BILITOT 0.5 04/11/2019   ALKPHOS 50 04/11/2019   AST 16 04/11/2019   ALT 6 04/11/2019   PROT 7.2 04/11/2019   ALBUMIN 4.2 04/11/2019   CALCIUM 9.1 02/10/2021   ANIONGAP 10 10/15/2017   EGFR 66 11/18/2020   GFR 61.48 02/10/2021   Lab Results  Component Value Date   CHOL 115 04/09/2020   Lab Results  Component Value Date   HDL 43.50 04/09/2020   Lab Results   Component Value Date   LDLCALC 59 04/09/2020   Lab Results  Component Value Date   TRIG 60.0 04/09/2020   Lab Results  Component Value Date   CHOLHDL 3 04/09/2020   Lab Results  Component Value Date   HGBA1C 6.2 (A) 03/02/2021       Assessment & Plan:   Problem List Items Addressed This Visit       Other   Shortness of breath - Primary    Recent shortness of breath as recent as last night.  States he has not been breathing same since COVID back in August 2022.  EKG within normal limits pending chest x-ray and lab.  Continue to monitor signs and symptoms reviewed when to seek urgent or emergent health care.      Relevant Orders   EKG 12-Lead (Completed)   Breast pain    Unable to feel or palpate mass in the right sided chest.  Unable to elicit pain with me pressing or patient pressing.  Patient able to move around to his torso without eliciting pain.  Continue to monitor      Atypical chest pain    Patient having a discomfort right-sided chest seems more superficial nerve related.  Given patient's age and CAD will obtain EKG and chest x-ray.  EKG within normal limits.  Pending chest x-ray and lab results      Relevant Orders   CBC   Basic metabolic panel  DG Chest 2 View   History of COVID-19     No orders of the defined types were placed in this encounter.  This visit occurred during the SARS-CoV-2 public health emergency.  Safety protocols were in place, including screening questions prior to the visit, additional usage of staff PPE, and extensive cleaning of exam room while observing appropriate contact time as indicated for disinfecting solutions.    Romilda Garret, NP

## 2021-06-07 ENCOUNTER — Other Ambulatory Visit: Payer: Self-pay | Admitting: Neurology

## 2021-06-08 NOTE — Addendum Note (Signed)
Addended by: [name] L on: 06/08/2021 10:50 AM   Modules accepted: Orders

## 2021-06-08 NOTE — Telephone Encounter (Signed)
Referral entered  

## 2021-06-12 ENCOUNTER — Other Ambulatory Visit (INDEPENDENT_AMBULATORY_CARE_PROVIDER_SITE_OTHER): Payer: PPO

## 2021-06-12 ENCOUNTER — Other Ambulatory Visit: Payer: Self-pay

## 2021-06-12 DIAGNOSIS — E785 Hyperlipidemia, unspecified: Secondary | ICD-10-CM

## 2021-06-12 DIAGNOSIS — E1142 Type 2 diabetes mellitus with diabetic polyneuropathy: Secondary | ICD-10-CM | POA: Diagnosis not present

## 2021-06-12 DIAGNOSIS — E1169 Type 2 diabetes mellitus with other specified complication: Secondary | ICD-10-CM

## 2021-06-12 LAB — BASIC METABOLIC PANEL
BUN: 21 mg/dL (ref 6–23)
CO2: 31 mEq/L (ref 19–32)
Calcium: 9.1 mg/dL (ref 8.4–10.5)
Chloride: 104 mEq/L (ref 96–112)
Creatinine, Ser: 1.26 mg/dL (ref 0.40–1.50)
GFR: 56.69 mL/min — ABNORMAL LOW (ref >60.00)
Glucose, Bld: 104 mg/dL — ABNORMAL HIGH (ref 70–99)
Potassium: 4 mEq/L (ref 3.5–5.1)
Sodium: 141 mEq/L (ref 135–145)

## 2021-06-12 LAB — HEPATIC FUNCTION PANEL
ALT: 6 U/L (ref 0–53)
AST: 16 U/L (ref 0–37)
Albumin: 4 g/dL (ref 3.5–5.2)
Alkaline Phosphatase: 52 U/L (ref 39–117)
Bilirubin, Direct: 0.1 mg/dL (ref 0.0–0.3)
Total Bilirubin: 0.3 mg/dL (ref 0.2–1.2)
Total Protein: 7.2 g/dL (ref 6.0–8.3)

## 2021-06-12 LAB — LIPID PANEL
Cholesterol: 137 mg/dL (ref 0–200)
HDL: 45.7 mg/dL (ref >39.00)
LDL Cholesterol: 68 mg/dL (ref 0–99)
NonHDL: 91.09
Total CHOL/HDL Ratio: 3
Triglycerides: 117 mg/dL (ref 0.0–149.0)
VLDL: 23.4 mg/dL (ref 0.0–40.0)

## 2021-06-12 LAB — HEMOGLOBIN A1C: Hgb A1c MFr Bld: 6.5 % (ref 4.6–6.5)

## 2021-06-14 ENCOUNTER — Other Ambulatory Visit: Payer: Self-pay | Admitting: Neurology

## 2021-06-15 NOTE — Telephone Encounter (Signed)
Rx refilled.

## 2021-06-23 ENCOUNTER — Encounter: Payer: Self-pay | Admitting: Physical Therapy

## 2021-06-23 ENCOUNTER — Ambulatory Visit: Payer: PPO | Attending: Nurse Practitioner | Admitting: Physical Therapy

## 2021-06-23 ENCOUNTER — Other Ambulatory Visit: Payer: Self-pay

## 2021-06-23 DIAGNOSIS — M62830 Muscle spasm of back: Secondary | ICD-10-CM | POA: Diagnosis present

## 2021-06-23 DIAGNOSIS — M545 Low back pain, unspecified: Secondary | ICD-10-CM | POA: Diagnosis present

## 2021-06-23 DIAGNOSIS — G8929 Other chronic pain: Secondary | ICD-10-CM | POA: Insufficient documentation

## 2021-06-23 DIAGNOSIS — M6281 Muscle weakness (generalized): Secondary | ICD-10-CM | POA: Insufficient documentation

## 2021-06-23 NOTE — Therapy (Signed)
[place]. [place], [place], [address] Phone: [phone]   Fax:  [phone]  Physical Therapy Evaluation  Patient Details  Name: Nicholas Rasmussen MRN: [id number] Date of Birth: 28-Mar-1946 Referring Provider (PT): Nche   Encounter Date: 06/23/2021   PT End of Session - 06/23/21 1612     Visit Number 1    Number of Visits 13    Date for PT Re-Evaluation 08/04/21    Authorization Type [employer]    Authorization Time Period 06/23/21 to 08/04/21    Progress Note Due on Visit 10    PT Start Time 1403    PT Stop Time 1445    PT Time Calculation (min) 42 min    Activity Tolerance Patient tolerated treatment well    Behavior During Therapy [facility] for tasks assessed/performed             Past Medical History:  Diagnosis Date   Allergic rhinitis 05/07/2014   Dizziness 01/02/2016   GERD (gastroesophageal reflux disease)    Headache 03/13/2015   Left flank pain 01/02/2016   Low back pain 06/06/2015   Pain of left side of body 08/13/2015   Post herpetic neuralgia    Routine general medical examination at a health care facility 05/07/2014   Seizure disorder ([place]) 05/07/2014   Seizures ([place])    Last seizure in the 70s.   Shingles    Stye 05/07/2014    Past Surgical History:  Procedure Laterality Date   COLONOSCOPY     KNEE SURGERY Left    left miniscus   UPPER GASTROINTESTINAL ENDOSCOPY     uvala surgery      There were no vitals filed for this visit.    Subjective Assessment - 06/23/21 1404     Subjective My low back has been bothering me, my neck also hurts, my knees hurt as well. A lot of different body parts are bothering me. I notice my posture isn't the greatest, I tend to lean to my left side to write notes and stuff, I don't really sit upright. Many moons ago I also hurt my ribs, don't know if that may be having an effect. I do have numbness and tingling in my feet but I am borderline  diabetic. No radicular symptoms. No groin numbness. I think it may have started when I moved some things around in storage a few months ago.    Patient Stated Goals get ready for travel, just get feeling better- doing a lot of travelling/driving at the end of March    Currently in Pain? Yes    Pain Score 5    at times can shoot to 8/10 at worst   Pain Location Back    Pain Orientation Right;Left;Lower;Medial    Pain Descriptors / Indicators Sharp    Pain Type Chronic pain    Pain Radiating Towards none    Pain Onset More than a month ago    Pain Frequency Constant    Aggravating Factors  unsure of specifics    Pain Relieving Factors heat?, tylenol    Effect of Pain on Daily Activities moderate                [facility] PT Assessment - 06/23/21 0001       Assessment   Medical Diagnosis back pain    Referring Provider (PT) Nche    Onset Date/Surgical Date --   few months ago   Next MD Visit PRN  Prior Therapy PT here for low back      Precautions   Precautions None      Restrictions   Weight Bearing Restrictions No      Balance Screen   Has the patient fallen in the past 6 months No    Has the patient had a decrease in activity level because of a fear of falling?  No    Is the patient reluctant to leave their home because of a fear of falling?  No      Home Ecologist residence      Prior Function   Level of Independence Independent;Independent with basic ADLs    Vocation Retired    Leisure dancing      Observation/Other Assessments   Observations B mild hip flexor tightness    Focus on Therapeutic Outcomes (FOTO)  55      ROM / Strength   AROM / PROM / Strength AROM;Strength      AROM   AROM Assessment Site Lumbar    Lumbar Flexion mild limitation, HS tightness    Lumbar Extension moderate limitaiton, R mm pain    Lumbar - Right Side Bend moderate limitation    Lumbar - Left Side Bend moderate limitation      Strength    Strength Assessment Site Knee;Hip;Ankle    Right/Left Hip Right;Left    Right Hip Flexion 4+/5    Right Hip Extension 3/5    Right Hip ABduction 4+/5    Left Hip Flexion 4+/5    Left Hip Extension 3/5    Left Hip ABduction 4+/5    Right/Left Knee Right;Left    Right Knee Flexion 4/5    Right Knee Extension 4+/5    Left Knee Flexion 4/5    Left Knee Extension 5/5    Right/Left Ankle Right;Left    Right Ankle Dorsiflexion 4+/5    Left Ankle Dorsiflexion 5/5      Flexibility   Soft Tissue Assessment /Muscle Length yes    Hamstrings mild tightness B    Quadriceps severe tightness    Piriformis mild tightness B      Palpation   Patella mobility definite mm tightness noted, no specific TTP trigger points or mm spasms    Spinal mobility lumbar spine seems quite rigid in general                        Objective measurements completed on examination: See above findings.       [place] Adult PT Treatment/Exercise - 06/23/21 0001       Exercises   Exercises Lumbar;Knee/Hip      Lumbar Exercises: Stretches   Active Hamstring Stretch Right;Left;1 rep;30 seconds    Figure 4 Stretch 1 rep;30 seconds      Lumbar Exercises: Supine   Bridge 10 reps    Other Supine Lumbar Exercises pelvic tilts 1x10                     PT Education - 06/23/21 1611     Education Details exam findings, POC, HEP    Person(s) Educated Patient    Methods Explanation;Handout;Demonstration    Comprehension Verbalized understanding;Returned demonstration              PT Short Term Goals - 06/23/21 1615       PT SHORT TERM GOAL #1   Title Will be independent with appropriate HEP to be progressed  PRN    Time 3    Period Weeks    Status New    Target Date 07/14/21      PT SHORT TERM GOAL #2   Title Pain to be no more than 2/10 in order to improve QOL and sleep    Time 3    Period Weeks    Status New      PT SHORT TERM GOAL #3   Title Will be able to maintain  upright posture with good core activation at least 75% of the time to help in reducing low back and thoracic/cervical pain    Time 3    Period Weeks    Status New      PT SHORT TERM GOAL #4   Title Lumbar ROM to be no more than 20% limited and  hamstring/piriformis/hip flexor flexibility to have improved by 50% to improve general mobility    Time 3    Period Weeks    Status New               PT Long Term Goals - 06/23/21 1758       PT LONG TERM GOAL #1   Title MMT to have improved by at least 1 grade in all weak groups to help reduce pain    Time 6    Period Weeks    Status New    Target Date 08/04/21      PT LONG TERM GOAL #2   Title Will be able to tolerate driving for at least 2 hours before needing to take a break without increase in pain so that he can travel to see family more easily    Time 6    Period Weeks    Status New      PT LONG TERM GOAL #3   Title Will demonstrate functionally correct lifting and general biomechanics without increase in pain to help prevent recurrence of symptoms    Time 6    Period Weeks    Status New      PT LONG TERM GOAL #4   Title FOTO score to have improved by at least 10 points to show general improvement in mobility and function    Time 6    Period Weeks    Status New                    Plan - 06/23/21 1613     Clinical Impression Statement Mr. Raper arrives today doing well; feels that he injured his back a few months ago lifting some items in his brothers storage shed and house, it his not getting better and he struggles with poor posture. Examination shows impaired posture and biomechanics along with poor core strength, functional muscle weakness, and limited lumbar and hip mobility. Will benefit from skilled PT services to address functional limitations, reduce pain, and improve QOL.    Personal Factors and Comorbidities Behavior Pattern;Past/Current Experience;Time since onset of injury/illness/exacerbation     Examination-Activity Limitations Locomotion Level;Transfers;Bed Mobility;Sit;Sleep;Carry;Squat;Stairs;Stand;Lift    Examination-Participation Restrictions Cleaning;Community Activity;Driving;Shop;Yard Work    Stability/Clinical Decision Making Stable/Uncomplicated    Clinical Decision Making Low    Rehab Potential Good    PT Frequency 2x / week    PT Duration 6 weeks    PT Treatment/Interventions ADLs/Self Care Home Management;Cryotherapy;Electrical Stimulation;Iontophoresis 4mg /ml Dexamethasone;Moist Heat;Ultrasound;Gait training;Stair training;Functional mobility training;Therapeutic activities;Therapeutic exercise;Balance training;Neuromuscular re-education;Patient/family education;Manual techniques;Passive range of motion;Dry needling;Energy conservation;Taping;Visual/perceptual remediation/compensation    PT Next Visit Plan focus on  functional strength and lumbar and hip mobility; assess as appropriate    PT Home Exercise Plan [email or web address]    Consulted and Agree with Plan of Care Patient             Patient will benefit from skilled therapeutic intervention in order to improve the following deficits and impairments:  Decreased range of motion, Increased fascial restricitons, Difficulty walking, Increased muscle spasms, Decreased activity tolerance, Pain, Hypomobility, Impaired flexibility, Improper body mechanics, Decreased strength, Postural dysfunction  Visit Diagnosis: Chronic midline low back pain without sciatica  Muscle weakness (generalized)  Muscle spasm of back     Problem List Patient Active Problem List   Diagnosis Date Noted   Shortness of breath 06/05/2021   Breast pain 06/05/2021   Atypical chest pain 06/05/2021   History of COVID-19 06/05/2021   [name] 04/07/2021   Complex renal cyst 04/07/2021   Chronic right-sided low back pain without sciatica 03/31/2020   Varicose veins of both lower extremities with pain  02/02/2019   Encounter for therapeutic drug level monitoring 09/19/2018   Type 2 diabetes mellitus with diabetic polyneuropathy, without long-term current use of insulin ([place]) 03/29/2018   Diabetic neuropathy ([place]) 03/29/2018   Chronic pain of left knee 03/23/2018   CAD (coronary artery disease) 08/03/2017   Post herpetic neuralgia 07/29/2016   Dizziness 01/02/2016   Chronic midline low back pain without sciatica 06/06/2015   GERD (gastroesophageal reflux disease) 11/15/2014   Seizure disorder ([place]) 05/07/2014   Allergic rhinitis 05/07/2014   Routine general medical examination at a health care facility 05/07/2014   Ann Lions PT, DPT, PN2   Supplemental Physical Therapist [place]. [place], [place], [address] Phone: [phone]   Fax:  [phone]  Name: Nicholas Rasmussen MRN: [id number] Date of Birth: 1945/06/10

## 2021-06-26 ENCOUNTER — Other Ambulatory Visit: Payer: Self-pay

## 2021-06-26 ENCOUNTER — Ambulatory Visit: Payer: PPO | Attending: Nurse Practitioner | Admitting: Physical Therapy

## 2021-06-26 DIAGNOSIS — G8929 Other chronic pain: Secondary | ICD-10-CM | POA: Diagnosis present

## 2021-06-26 DIAGNOSIS — M6281 Muscle weakness (generalized): Secondary | ICD-10-CM | POA: Diagnosis present

## 2021-06-26 DIAGNOSIS — M545 Low back pain, unspecified: Secondary | ICD-10-CM | POA: Diagnosis present

## 2021-06-26 DIAGNOSIS — M79604 Pain in right leg: Secondary | ICD-10-CM | POA: Diagnosis present

## 2021-06-26 DIAGNOSIS — M79605 Pain in left leg: Secondary | ICD-10-CM | POA: Diagnosis present

## 2021-06-26 DIAGNOSIS — M62830 Muscle spasm of back: Secondary | ICD-10-CM | POA: Diagnosis present

## 2021-06-26 NOTE — Therapy (Signed)
[place]. [place], [place], [address] Phone: [phone]   Fax:  [phone]  Physical Therapy Treatment  Patient Details  Name: Nicholas Rasmussen MRN: [id number] Date of Birth: 07-27-1945 Referring Provider (PT): Nche   Encounter Date: 06/26/2021   PT End of Session - 06/26/21 1130     Visit Number 2    Number of Visits 13    Date for PT Re-Evaluation 08/04/21    Authorization Type [employer]    Authorization Time Period 06/23/21 to 08/04/21    PT Start Time 1055    PT Stop Time 1135    PT Time Calculation (min) 40 min             Past Medical History:  Diagnosis Date   Allergic rhinitis 05/07/2014   Dizziness 01/02/2016   GERD (gastroesophageal reflux disease)    Headache 03/13/2015   Left flank pain 01/02/2016   Low back pain 06/06/2015   Pain of left side of body 08/13/2015   Post herpetic neuralgia    Routine general medical examination at a health care facility 05/07/2014   Seizure disorder ([place]) 05/07/2014   Seizures ([place])    Last seizure in the 70s.   Shingles    Stye 05/07/2014    Past Surgical History:  Procedure Laterality Date   COLONOSCOPY     KNEE SURGERY Left    left miniscus   UPPER GASTROINTESTINAL ENDOSCOPY     uvala surgery      There were no vitals filed for this visit.   Subjective Assessment - 06/26/21 1058     Subjective need a tune up- everything hurts. reports doing HEP    Currently in Pain? Yes    Pain Score 5                                [facility] Adult PT Treatment/Exercise - 06/26/21 0001       Lumbar Exercises: Aerobic   Nustep L 5 6 min      Lumbar Exercises: Machines for Strengthening   Cybex Lumbar Extension black tband 2 sets 10   trunk flex and ext   Cybex Knee Extension 10# 2 sets 10    Cybex Knee Flexion 20# 2 sets 10    Other Lumbar Machine Exercise lat pull down and rows 2 sets 10 20#      Lumbar Exercises:  Standing   Heel Raises 20 reps   black bar     Lumbar Exercises: Seated   Sit to Stand 10 reps   wt ball chest press   Other Seated Lumbar Exercises wt ball trunk rotation 10 each way      Lumbar Exercises: Supine   Ab Set 15 reps;3 seconds   seated iso with ball   Other Supine Lumbar Exercises feet on ball bridge,KTC and obl      Manual Therapy   Manual Therapy Passive ROM    Passive ROM BIL LE and trunk                       PT Short Term Goals - 06/23/21 1615       PT SHORT TERM GOAL #1   Title Will be independent with appropriate HEP to be progressed PRN    Time 3    Period Weeks    Status New    Target Date 07/14/21  PT SHORT TERM GOAL #2   Title Pain to be no more than 2/10 in order to improve QOL and sleep    Time 3    Period Weeks    Status New      PT SHORT TERM GOAL #3   Title Will be able to maintain upright posture with good core activation at least 75% of the time to help in reducing low back and thoracic/cervical pain    Time 3    Period Weeks    Status New      PT SHORT TERM GOAL #4   Title Lumbar ROM to be no more than 20% limited and  hamstring/piriformis/hip flexor flexibility to have improved by 50% to improve general mobility    Time 3    Period Weeks    Status New               PT Long Term Goals - 06/23/21 1758       PT LONG TERM GOAL #1   Title MMT to have improved by at least 1 grade in all weak groups to help reduce pain    Time 6    Period Weeks    Status New    Target Date 08/04/21      PT LONG TERM GOAL #2   Title Will be able to tolerate driving for at least 2 hours before needing to take a break without increase in pain so that he can travel to see family more easily    Time 6    Period Weeks    Status New      PT LONG TERM GOAL #3   Title Will demonstrate functionally correct lifting and general biomechanics without increase in pain to help prevent recurrence of symptoms    Time 6    Period Weeks     Status New      PT LONG TERM GOAL #4   Title FOTO score to have improved by at least 10 points to show general improvement in mobility and function    Time 6    Period Weeks    Status New                   Plan - 06/26/21 1131     Clinical Impression Statement pt tolertaed initial ther ex progression well, no pain and minimal fatigue.minimal LE tightness with stretching    PT Treatment/Interventions ADLs/Self Care Home Management;Cryotherapy;Electrical Stimulation;Iontophoresis 4mg /ml Dexamethasone;Moist Heat;Ultrasound;Gait training;Stair training;Functional mobility training;Therapeutic activities;Therapeutic exercise;Balance training;Neuromuscular re-education;Patient/family education;Manual techniques;Passive range of motion;Dry needling;Energy conservation;Taping;Visual/perceptual remediation/compensation    PT Next Visit Plan asses sand progress. BM for lifting             Patient will benefit from skilled therapeutic intervention in order to improve the following deficits and impairments:  Decreased range of motion, Increased fascial restricitons, Difficulty walking, Increased muscle spasms, Decreased activity tolerance, Pain, Hypomobility, Impaired flexibility, Improper body mechanics, Decreased strength, Postural dysfunction  Visit Diagnosis: Chronic midline low back pain without sciatica  Muscle weakness (generalized)     Problem List Patient Active Problem List   Diagnosis Date Noted   Shortness of breath 06/05/2021   Breast pain 06/05/2021   Atypical chest pain 06/05/2021   History of COVID-19 06/05/2021   [name] 04/07/2021   Complex renal cyst 04/07/2021   Chronic right-sided low back pain without sciatica 03/31/2020   Varicose veins of both lower extremities with pain 02/02/2019  Encounter for therapeutic drug level monitoring 09/19/2018   Type 2 diabetes mellitus with diabetic polyneuropathy, without  long-term current use of insulin ([place]) 03/29/2018   Diabetic neuropathy ([place]) 03/29/2018   Chronic pain of left knee 03/23/2018   CAD (coronary artery disease) 08/03/2017   Post herpetic neuralgia 07/29/2016   Dizziness 01/02/2016   Chronic midline low back pain without sciatica 06/06/2015   GERD (gastroesophageal reflux disease) 11/15/2014   Seizure disorder ([place]) 05/07/2014   Allergic rhinitis 05/07/2014   Routine general medical examination at a health care facility 05/07/2014    [facility], PTA 06/26/2021, 11:35 AM  [place]. [place], [place], [address] Phone: [phone]   Fax:  [phone]  Name: Nicholas Rasmussen MRN: [id number] Date of Birth: March 15, 1946

## 2021-06-27 ENCOUNTER — Other Ambulatory Visit: Payer: Self-pay | Admitting: Neurology

## 2021-06-30 ENCOUNTER — Encounter: Payer: Self-pay | Admitting: Physical Therapy

## 2021-06-30 ENCOUNTER — Ambulatory Visit (INDEPENDENT_AMBULATORY_CARE_PROVIDER_SITE_OTHER): Payer: PPO | Admitting: Family Medicine

## 2021-06-30 ENCOUNTER — Other Ambulatory Visit: Payer: Self-pay

## 2021-06-30 ENCOUNTER — Ambulatory Visit: Payer: PPO | Admitting: Physical Therapy

## 2021-06-30 ENCOUNTER — Ambulatory Visit (INDEPENDENT_AMBULATORY_CARE_PROVIDER_SITE_OTHER): Payer: PPO

## 2021-06-30 ENCOUNTER — Encounter: Payer: Self-pay | Admitting: Family Medicine

## 2021-06-30 VITALS — BP 116/70 | HR 49 | Temp 97.0°F | Ht 76.0 in | Wt 240.4 lb

## 2021-06-30 DIAGNOSIS — G8929 Other chronic pain: Secondary | ICD-10-CM

## 2021-06-30 DIAGNOSIS — M79604 Pain in right leg: Secondary | ICD-10-CM

## 2021-06-30 DIAGNOSIS — M25521 Pain in right elbow: Secondary | ICD-10-CM

## 2021-06-30 DIAGNOSIS — M25562 Pain in left knee: Secondary | ICD-10-CM

## 2021-06-30 DIAGNOSIS — M62830 Muscle spasm of back: Secondary | ICD-10-CM

## 2021-06-30 DIAGNOSIS — M6281 Muscle weakness (generalized): Secondary | ICD-10-CM

## 2021-06-30 DIAGNOSIS — M545 Low back pain, unspecified: Secondary | ICD-10-CM

## 2021-06-30 DIAGNOSIS — M79605 Pain in left leg: Secondary | ICD-10-CM

## 2021-06-30 MED ORDER — DICLOFENAC SODIUM 1 % EX GEL: CUTANEOUS | 1 refills | Status: DC

## 2021-06-30 NOTE — Therapy (Signed)
[place]. [place], [place], [address] Phone: [phone]   Fax:  [phone]  Physical Therapy Treatment  Patient Details  Name: Nicholas Rasmussen MRN: [id number] Date of Birth: 13-Sep-1945 Referring Provider (PT): Nche   Encounter Date: 06/30/2021   PT End of Session - 06/30/21 1218     Visit Number 3    Date for PT Re-Evaluation 08/04/21    Authorization Type [employer]    Authorization Time Period 06/23/21 to 08/04/21    PT Start Time 1145    PT Stop Time 1230    PT Time Calculation (min) 45 min    Activity Tolerance Patient tolerated treatment well    Behavior During Therapy [facility] for tasks assessed/performed             Past Medical History:  Diagnosis Date   Allergic rhinitis 05/07/2014   Dizziness 01/02/2016   GERD (gastroesophageal reflux disease)    Headache 03/13/2015   Left flank pain 01/02/2016   Low back pain 06/06/2015   Pain of left side of body 08/13/2015   Post herpetic neuralgia    Routine general medical examination at a health care facility 05/07/2014   Seizure disorder ([place]) 05/07/2014   Seizures ([place])    Last seizure in the 70s.   Shingles    Stye 05/07/2014    Past Surgical History:  Procedure Laterality Date   COLONOSCOPY     KNEE SURGERY Left    left miniscus   UPPER GASTROINTESTINAL ENDOSCOPY     uvala surgery      There were no vitals filed for this visit.   Subjective Assessment - 06/30/21 1145     Subjective "Pretty good since last session"    Currently in Pain? Yes    Pain Score 5     Pain Location Back    Pain Orientation Lower;Right;Mid                               [facility] Adult PT Treatment/Exercise - 06/30/21 0001       Lumbar Exercises: Stretches   Active Hamstring Stretch Right;Left;3 reps;4 reps;10 seconds;20 seconds    Single Knee to Chest Stretch Left;Right;20 seconds;5 reps;3 reps    Figure 4 Stretch 3 reps;20  seconds      Lumbar Exercises: Aerobic   Nustep L 5 6 min      Lumbar Exercises: Machines for Strengthening   Other Lumbar Machine Exercise lat pull down and rows 2 sets 10 25#    Other Lumbar Machine Exercise Shoulder Ext 10lb 2x10      Lumbar Exercises: Standing   Row Strengthening;Power tower;20 reps;Both    Row Limitations 15    Other Standing Lumbar Exercises AR press 15lb x10 each      Lumbar Exercises: Seated   Sit to Stand 10 reps   Holding yellow ball x2     Lumbar Exercises: Supine   Other Supine Lumbar Exercises feet on ball bridge,KTC and obl                       PT Short Term Goals - 06/23/21 1615       PT SHORT TERM GOAL #1   Title Will be independent with appropriate HEP to be progressed PRN    Time 3    Period Weeks    Status New    Target Date 07/14/21  PT SHORT TERM GOAL #2   Title Pain to be no more than 2/10 in order to improve QOL and sleep    Time 3    Period Weeks    Status New      PT SHORT TERM GOAL #3   Title Will be able to maintain upright posture with good core activation at least 75% of the time to help in reducing low back and thoracic/cervical pain    Time 3    Period Weeks    Status New      PT SHORT TERM GOAL #4   Title Lumbar ROM to be no more than 20% limited and  hamstring/piriformis/hip flexor flexibility to have improved by 50% to improve general mobility    Time 3    Period Weeks    Status New               PT Long Term Goals - 06/23/21 1758       PT LONG TERM GOAL #1   Title MMT to have improved by at least 1 grade in all weak groups to help reduce pain    Time 6    Period Weeks    Status New    Target Date 08/04/21      PT LONG TERM GOAL #2   Title Will be able to tolerate driving for at least 2 hours before needing to take a break without increase in pain so that he can travel to see family more easily    Time 6    Period Weeks    Status New      PT LONG TERM GOAL #3   Title Will  demonstrate functionally correct lifting and general biomechanics without increase in pain to help prevent recurrence of symptoms    Time 6    Period Weeks    Status New      PT LONG TERM GOAL #4   Title FOTO score to have improved by at least 10 points to show general improvement in mobility and function    Time 6    Period Weeks    Status New                   Plan - 06/30/21 1219     Clinical Impression Statement Pt did well with a progressed session. Tactile cue to maintain posture needed with shoulder extensions. Some core weakness on the L side noted with anti rotational presses. Min fatigue with activity. No report of increase pain.    Personal Factors and Comorbidities Behavior Pattern;Past/Current Experience;Time since onset of injury/illness/exacerbation    Examination-Activity Limitations Locomotion Level;Transfers;Bed Mobility;Sit;Sleep;Carry;Squat;Stairs;Stand;Lift    Examination-Participation Restrictions Cleaning;Community Activity;Driving;Shop;Yard Work    Stability/Clinical Decision Making Stable/Uncomplicated    Rehab Potential Good    PT Frequency 2x / week    PT Duration 6 weeks    PT Treatment/Interventions ADLs/Self Care Home Management;Cryotherapy;Electrical Stimulation;Iontophoresis 4mg /ml Dexamethasone;Moist Heat;Ultrasound;Gait training;Stair training;Functional mobility training;Therapeutic activities;Therapeutic exercise;Balance training;Neuromuscular re-education;Patient/family education;Manual techniques;Passive range of motion;Dry needling;Energy conservation;Taping;Visual/perceptual remediation/compensation    PT Next Visit Plan asses sand progress. BM for lifting             Patient will benefit from skilled therapeutic intervention in order to improve the following deficits and impairments:  Decreased range of motion, Increased fascial restricitons, Difficulty walking, Increased muscle spasms, Decreased activity tolerance, Pain, Hypomobility,  Impaired flexibility, Improper body mechanics, Decreased strength, Postural dysfunction  Visit Diagnosis: Chronic midline low back pain without sciatica  Muscle spasm of back  Muscle weakness (generalized)  Pain in left leg  Acute bilateral low back pain without sciatica  Pain in right leg     Problem List Patient Active Problem List   Diagnosis Date Noted   Shortness of breath 06/05/2021   Breast pain 06/05/2021   Atypical chest pain 06/05/2021   History of COVID-19 06/05/2021   [name] 04/07/2021   Complex renal cyst 04/07/2021   Chronic right-sided low back pain without sciatica 03/31/2020   Varicose veins of both lower extremities with pain 02/02/2019   Encounter for therapeutic drug level monitoring 09/19/2018   Type 2 diabetes mellitus with diabetic polyneuropathy, without long-term current use of insulin ([place]) 03/29/2018   Diabetic neuropathy ([place]) 03/29/2018   Chronic pain of left knee 03/23/2018   CAD (coronary artery disease) 08/03/2017   Post herpetic neuralgia 07/29/2016   Dizziness 01/02/2016   Chronic midline low back pain without sciatica 06/06/2015   GERD (gastroesophageal reflux disease) 11/15/2014   Seizure disorder ([place]) 05/07/2014   Allergic rhinitis 05/07/2014   Routine general medical examination at a health care facility 05/07/2014    Scot Jun, PTA 06/30/2021, 12:26 PM  [place]. [place], [place], [address] Phone: [phone]   Fax:  [phone]  Name: Nicholas Rasmussen MRN: [id number] Date of Birth: 1945-12-11

## 2021-06-30 NOTE — Progress Notes (Signed)
Today  Established Patient Office Visit  Subjective:  Patient ID: Nicholas Rasmussen, male    DOB: 02/01/1946  Age: 76 y.o. MRN: [id number]  CC:  Chief Complaint  Patient presents with   Pain    Right elbow pain x 1 week have subsided some. Unable to straighten elbow last week. Also some pains that come and go in left knee.     HPI Nicholas Rasmussen presents for evaluation of a recent history of right elbow and left knee pain.  A few weeks ago right elbow became sore and stiff with limited flexion.  He cannot think of a specific injury.  He had been lifting some boxes in a storage shed.  He is right-hand dominant.  Ongoing history of left knee pain.  Distant history of meniscus repair in 2000.  Denies locking or giving way.  X-rays taken years ago did demonstrate arthritis.  He is a lifetime athlete.  He remains active by dancing and walking.  His pulse rate is usually low.  Past Medical History:  Diagnosis Date   Allergic rhinitis 05/07/2014   Dizziness 01/02/2016   GERD (gastroesophageal reflux disease)    Headache 03/13/2015   Left flank pain 01/02/2016   Low back pain 06/06/2015   Pain of left side of body 08/13/2015   Post herpetic neuralgia    Routine general medical examination at a health care facility 05/07/2014   Seizure disorder ([place]) 05/07/2014   Seizures ([place])    Last seizure in the 70s.   Shingles    Stye 05/07/2014    Past Surgical History:  Procedure Laterality Date   COLONOSCOPY     KNEE SURGERY Left    left miniscus   UPPER GASTROINTESTINAL ENDOSCOPY     uvala surgery      Family History  Problem Relation Age of Onset   Diabetes Father    Hypertension Father    Other Mother        natural causes    Social History   Socioeconomic History   Marital status: Single    Spouse name: Not on file   Number of children: 3   Years of education: Associates   Highest education level: Not on file  Occupational History   Occupation: Retired    Comment: Oceanographer   Tobacco Use   Smoking status: Former   Smokeless tobacco: Never  Scientific laboratory technician Use: Never used  Substance and Sexual Activity   Alcohol use: Yes    Alcohol/week: 0.0 standard drinks    Comment: occasional   Drug use: No   Sexual activity: Not on file  Other Topics Concern   Not on file  Social History Narrative   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   Right-handed.   Rarely drinks caffeine.   Social Determinants of Health   Financial Resource Strain: Low Risk    Difficulty of Paying Living Expenses: Not hard at all  Food Insecurity: No Food Insecurity   Worried About Charity fundraiser in the Last Year: Never true   [place] in the Last Year: Never true  Transportation Needs: No Transportation Needs   Lack of Transportation (Medical): No   Lack of Transportation (Non-Medical): No  Physical Activity: Sufficiently Active   Days of Exercise per Week: 2 days   Minutes of Exercise per Session: 100 min  Stress:  No Stress Concern Present   Feeling of Stress : Not at all  Social Connections: Moderately Integrated   Frequency of Communication with Friends and Family: Twice a week   Frequency of Social Gatherings with Friends and Family: Twice a week   Attends Religious Services: More than 4 times per year   Active Member of [employer] or Organizations: No   Attends Music therapist: Never   Marital Status: Married  Human resources officer Violence: Not At Risk   Fear of Current or Ex-Partner: No   Emotionally Abused: No   Physically Abused: No   Sexually Abused: No    Outpatient Medications Prior to Visit  Medication Sig Dispense Refill   acetaminophen (TYLENOL) 500 MG tablet Take 1 tablet (500 mg total) by mouth every 8 (eight) hours as needed. 30 tablet 0   acyclovir (ZOVIRAX) 200 MG capsule Take 1 capsule (200 mg total) by mouth 5 (five) times daily.  25 capsule 1   atorvastatin (LIPITOR) 20 MG tablet Take 1 tablet (20 mg total) by mouth daily at 6 PM. 90 tablet 1   divalproex (DEPAKOTE ER) 500 MG 24 hr tablet TAKE 1 TABLET BY MOUTH AT BEDTIME (CALL [phone] FOR APPT) 90 tablet 0   empagliflozin (JARDIANCE) 10 MG TABS tablet Take 1 tablet (10 mg total) by mouth daily. 90 tablet 1   gabapentin (NEURONTIN) 300 MG capsule TAKE 1 CAPSULE BY MOUTH EVERYDAY AT BEDTIME 90 capsule 0   ibuprofen (ADVIL) 800 MG tablet Take 1 tablet (800 mg total) by mouth 3 (three) times daily. 21 tablet 0   meclizine (ANTIVERT) 25 MG tablet Take 1 tablet (25 mg total) by mouth 3 (three) times daily as needed. 10 tablet 0   metoprolol succinate (TOPROL-XL) 25 MG 24 hr tablet Take 1 tablet (25 mg total) by mouth daily. Please keep upcoming appointment in August 2022 for future refills. Thank you 90 tablet 1   omeprazole (PRILOSEC) 20 MG capsule TAKE 1 CAPSULE BY MOUTH DAILY AS NEEDED. 90 capsule 1   Lancets (ONETOUCH DELICA PLUS [id number]) MISC USE AS DIRECTED. TAKE BLOOD SUGAR 2 -3 TIMES PER WEEK BEFORE MEALS. 100 each 3   No facility-administered medications prior to visit.    No Known Allergies  ROS Review of Systems  Constitutional: Negative.   HENT: Negative.    Respiratory: Negative.    Cardiovascular: Negative.   Gastrointestinal: Negative.   Musculoskeletal:  Positive for arthralgias. Negative for gait problem and joint swelling.  Neurological:  Negative for weakness and numbness.     Objective:    Physical Exam Vitals and nursing note reviewed.  Constitutional:      General: He is not in acute distress.    Appearance: Normal appearance. He is not ill-appearing, toxic-appearing or diaphoretic.  HENT:     Head: Normocephalic and atraumatic.     Right Ear: External ear normal.     Left Ear: External ear normal.  Eyes:     General:        Right eye: No discharge.        Left eye: No discharge.     Extraocular Movements: Extraocular movements  intact.     Conjunctiva/sclera: Conjunctivae normal.  Musculoskeletal:     Right elbow: No swelling, deformity, effusion or lacerations. Normal range of motion. No tenderness.     Left knee: No swelling, deformity, effusion, erythema or bony tenderness. Normal range of motion.     Instability Tests: Anterior drawer test negative. Posterior drawer  test negative.     Comments: Negative apprehension test.  Negative grind test.   Well-healed surgical lacerations about his knee consistent with his history of arthroscopy  Neurological:     Mental Status: He is alert.  Psychiatric:        Mood and Affect: Mood normal.        Behavior: Behavior normal.    BP 116/70 (BP Location: Right Arm, Patient Position: Sitting, Cuff Size: Large)    Pulse (!) 49    Temp (!) 97 F (36.1 C) (Temporal)    Ht _0  (1.93 m)    Wt 240 lb 6.4 oz (109 kg)    SpO2 97%    BMI 29.26 kg/m  Wt Readings from Last 3 Encounters:  06/30/21 240 lb 6.4 oz (109 kg)  06/05/21 243 lb (110.2 kg)  05/27/21 238 lb (108 kg)     There are no preventive care reminders to display for this patient.  There are no preventive care reminders to display for this patient.  Lab Results  Component Value Date   TSH 1.25 10/08/2019   Lab Results  Component Value Date   WBC 4.9 06/05/2021   HGB 12.9 (L) 06/05/2021   HCT 40.5 06/05/2021   MCV 83.0 06/05/2021   PLT 200.0 06/05/2021   Lab Results  Component Value Date   NA 141 06/12/2021   K 4.0 06/12/2021   CO2 31 06/12/2021   GLUCOSE 104 (H) 06/12/2021   BUN 21 06/12/2021   CREATININE 1.26 06/12/2021   BILITOT 0.3 06/12/2021   ALKPHOS 52 06/12/2021   AST 16 06/12/2021   ALT 6 06/12/2021   PROT 7.2 06/12/2021   ALBUMIN 4.0 06/12/2021   CALCIUM 9.1 06/12/2021   ANIONGAP 10 10/15/2017   EGFR 66 11/18/2020   GFR 56.69 (L) 06/12/2021   Lab Results  Component Value Date   CHOL 137 06/12/2021   Lab Results  Component Value Date   HDL 45.70 06/12/2021   Lab Results   Component Value Date   LDLCALC 68 06/12/2021   Lab Results  Component Value Date   TRIG 117.0 06/12/2021   Lab Results  Component Value Date   CHOLHDL 3 06/12/2021   Lab Results  Component Value Date   HGBA1C 6.5 06/12/2021      Assessment & Plan:   Problem List Items Addressed This Visit       Other   Chronic pain of left knee - Primary   Relevant Medications   diclofenac Sodium (VOLTAREN) 1 % GEL   Other Relevant Orders   DG Knee Complete 4 Views Left   Other Visit Diagnoses     Right elbow pain       Relevant Medications   diclofenac Sodium (VOLTAREN) 1 % GEL   Other Relevant Orders   DG Elbow Complete Right       Meds ordered this encounter  Medications   diclofenac Sodium (VOLTAREN) 1 % GEL    Sig: Apply a small grape sized dollop to sore joint up to every 6 hours as needed    Dispense:  150 g    Refill:  1    Follow-up: Return Fu with Baldo Ash if not improving.Libby Maw, MD

## 2021-07-01 ENCOUNTER — Telehealth: Payer: Self-pay

## 2021-07-01 NOTE — Progress Notes (Signed)
Chronic Care Management Pharmacy Assistant   Name: Nicholas Rasmussen  MRN: [id number] DOB: 1946-01-01  Reason for Encounter: Medication Review/General Adherence call   Recent office visits:  06/30/2021 Dr. Ethelene Hal MD (PCP) start diclofenac Sodium 1 % GEL PRN 06/05/2021 [name] NP (PCP) No Medication Changes noted, Ambulatory referral to Physical Therapy 05/26/2021 Randel Pigg LPN (PCP Office) Medicare wellness completed, No medication Changes noted 04/03/2021 [name] NP (PCP) No Medication Changes noted, Follow up in 6 months 03/02/2021 [name] NP (PCP) No Medication Changes noted  Recent consult visits:  06/30/2021 Cheri Fowler PTA (Physical Therapy) No Medication Changes noted 06/26/2021 Franki Monte PTA (Physical Therapy) No Medication Changes noted 06/23/2021 Deniece Ree PT (Physical Therapy) No Medication Changes noted 06/05/2021 Karl Ito NP (Pain Medicine) No Medication Changes noted  Hospital visits:  Medication Reconciliation was completed by comparing discharge summary, patient's EMR and Pharmacy list, and upon discussion with patient.  Admitted to the hospital on 05/27/2021 due to Acute conjunctivitis of eight eye. Discharge date was 05/27/2021. Discharged from [facility]?Medications Started at [facility] Discharge:?? -started erythromycin ophthalmic ointment =  Medication Changes at Hospital Discharge: -Changed None  Medications Discontinued at Hospital Discharge: -Stopped None  Medications that remain the same after Hospital Discharge:??  -All other medications will remain the same.    Medications: Outpatient Encounter Medications as of 07/01/2021  Medication Sig   acetaminophen (TYLENOL) 500 MG tablet Take 1 tablet (500 mg total) by mouth every 8 (eight) hours as needed.   acyclovir (ZOVIRAX) 200 MG capsule Take 1 capsule (200 mg total) by mouth 5 (five) times daily.   atorvastatin (LIPITOR) 20 MG tablet  Take 1 tablet (20 mg total) by mouth daily at 6 PM.   diclofenac Sodium (VOLTAREN) 1 % GEL Apply a small grape sized dollop to sore joint up to every 6 hours as needed   divalproex (DEPAKOTE ER) 500 MG 24 hr tablet TAKE 1 TABLET BY MOUTH AT BEDTIME (CALL [phone] FOR APPT)   empagliflozin (JARDIANCE) 10 MG TABS tablet Take 1 tablet (10 mg total) by mouth daily.   gabapentin (NEURONTIN) 300 MG capsule TAKE 1 CAPSULE BY MOUTH EVERYDAY AT BEDTIME   ibuprofen (ADVIL) 800 MG tablet Take 1 tablet (800 mg total) by mouth 3 (three) times daily.   meclizine (ANTIVERT) 25 MG tablet Take 1 tablet (25 mg total) by mouth 3 (three) times daily as needed.   metoprolol succinate (TOPROL-XL) 25 MG 24 hr tablet Take 1 tablet (25 mg total) by mouth daily. Please keep upcoming appointment in August 2022 for future refills. Thank you   omeprazole (PRILOSEC) 20 MG capsule TAKE 1 CAPSULE BY MOUTH DAILY AS NEEDED.   No facility-administered encounter medications on file as of 07/01/2021.   Care Gaps: None ID  Star Rating Drugs: Jardiance 10 mg last filled on 06/20/2021 [facility] day [facility].  Atorvastatin 20 mg last filled on 05/28/2021 for [facility] day supply at [facility]. Medication Fill Gaps: None   Called patient and discussed medication adherence  with patient, no issues at this time with current medication.   Patient Denies ED visit since his last CPP follow up.  Patient Denies  any side effects with his medication. Patient Denies  any problems with hiscurrent pharmacy  Patient states he is doing good with no question/concerns for the clinical pharmacist.Patient agreed to schedule follow up appointment with the clinical pharmacist since it has been over a year.  Telephone follow up appointment with Care management team member was scheduled for : 10/15/2021 at 3:00 pm.  Bessie Pender Pharmacist Assistant [phone]

## 2021-07-02 ENCOUNTER — Other Ambulatory Visit: Payer: Self-pay

## 2021-07-02 ENCOUNTER — Encounter: Payer: Self-pay | Admitting: Physical Therapy

## 2021-07-02 ENCOUNTER — Ambulatory Visit: Payer: PPO | Admitting: Physical Therapy

## 2021-07-02 DIAGNOSIS — G8929 Other chronic pain: Secondary | ICD-10-CM

## 2021-07-02 DIAGNOSIS — M545 Low back pain, unspecified: Secondary | ICD-10-CM

## 2021-07-02 DIAGNOSIS — M62830 Muscle spasm of back: Secondary | ICD-10-CM

## 2021-07-02 DIAGNOSIS — M6281 Muscle weakness (generalized): Secondary | ICD-10-CM

## 2021-07-02 NOTE — Therapy (Signed)
[name]. [place], [place], [address] Phone: [phone]   Fax:  [phone]  Physical Therapy Treatment  Patient Details  Name: Nicholas Rasmussen MRN: [id number] Date of Birth: 1946-02-25 Referring Provider (PT): Nche   Encounter Date: 07/02/2021   PT End of Session - 07/02/21 1226     Visit Number 4    Date for PT Re-Evaluation 08/04/21    Authorization Type [employer]    Authorization Time Period 06/23/21 to 08/04/21    PT Start Time 1145    PT Stop Time 1228    PT Time Calculation (min) 43 min    Activity Tolerance Patient tolerated treatment well    Behavior During Therapy [facility] for tasks assessed/performed             Past Medical History:  Diagnosis Date   Allergic rhinitis 05/07/2014   Dizziness 01/02/2016   GERD (gastroesophageal reflux disease)    Headache 03/13/2015   Left flank pain 01/02/2016   Low back pain 06/06/2015   Pain of left side of body 08/13/2015   Post herpetic neuralgia    Routine general medical examination at a health care facility 05/07/2014   Seizure disorder ([place]) 05/07/2014   Seizures ([place])    Last seizure in the 70s.   Shingles    Stye 05/07/2014    Past Surgical History:  Procedure Laterality Date   COLONOSCOPY     KNEE SURGERY Left    left miniscus   UPPER GASTROINTESTINAL ENDOSCOPY     uvala surgery      There were no vitals filed for this visit.   Subjective Assessment - 07/02/21 1144     Subjective "Pretty good'    Currently in Pain? Yes    Pain Score 5     Pain Location Back    Pain Orientation Left;Lower                               [facility] Adult PT Treatment/Exercise - 07/02/21 0001       Lumbar Exercises: Stretches   Single Knee to Chest Stretch Left;Right;20 seconds;5 reps;3 reps    Figure 4 Stretch 20 seconds;4 reps      Lumbar Exercises: Aerobic   Nustep L 5 6 min      Lumbar Exercises: Machines for  Strengthening   Cybex Knee Extension 10# 2 sets 10    Cybex Knee Flexion 35lb 2x10    Other Lumbar Machine Exercise lat pull down and rows 2 sets 10 25#    Other Lumbar Machine Exercise Shoulder Ext 10lb 2x10      Lumbar Exercises: Seated   Sit to Stand 10 reps   x2 with chest press     Lumbar Exercises: Supine   Bridge 10 reps    Other Supine Lumbar Exercises feet on ball bridge,KTC and obl                       PT Short Term Goals - 07/02/21 1230       PT SHORT TERM GOAL #1   Title Will be independent with appropriate HEP to be progressed PRN    Status Partially Met      PT SHORT TERM GOAL #2   Title Pain to be no more than 2/10 in order to improve QOL and sleep    Status On-going  PT Long Term Goals - 06/23/21 1758       PT LONG TERM GOAL #1   Title MMT to have improved by at least 1 grade in all weak groups to help reduce pain    Time 6    Period Weeks    Status New    Target Date 08/04/21      PT LONG TERM GOAL #2   Title Will be able to tolerate driving for at least 2 hours before needing to take a break without increase in pain so that he can travel to see family more easily    Time 6    Period Weeks    Status New      PT LONG TERM GOAL #3   Title Will demonstrate functionally correct lifting and general biomechanics without increase in pain to help prevent recurrence of symptoms    Time 6    Period Weeks    Status New      PT LONG TERM GOAL #4   Title FOTO score to have improved by at least 10 points to show general improvement in mobility and function    Time 6    Period Weeks    Status New                   Plan - 07/02/21 1227     Clinical Impression Statement Pt enters clinic feeling well with 5/10 low back pain. Increase resistance tolerated with machine level interventions.  Cues needed to hold contraction with seated rows. Bilateral LE tightness noted with passive stretching. Cue for core engagement  needed with supine bridges.    Personal Factors and Comorbidities Behavior Pattern;Past/Current Experience;Time since onset of injury/illness/exacerbation    Examination-Activity Limitations Locomotion Level;Transfers;Bed Mobility;Sit;Sleep;Carry;Squat;Stairs;Stand;Lift    Examination-Participation Restrictions Cleaning;Community Activity;Driving;Shop;Yard Work    Stability/Clinical Decision Making Stable/Uncomplicated    Rehab Potential Good    PT Frequency 2x / week    PT Duration 6 weeks    PT Treatment/Interventions ADLs/Self Care Home Management;Cryotherapy;Electrical Stimulation;Iontophoresis 45m/ml Dexamethasone;Moist Heat;Ultrasound;Gait training;Stair training;Functional mobility training;Therapeutic activities;Therapeutic exercise;Balance training;Neuromuscular re-education;Patient/family education;Manual techniques;Passive range of motion;Dry needling;Energy conservation;Taping;Visual/perceptual remediation/compensation    PT Next Visit Plan asses sand progress. BM for lifting             Patient will benefit from skilled therapeutic intervention in order to improve the following deficits and impairments:  Decreased range of motion, Increased fascial restricitons, Difficulty walking, Increased muscle spasms, Decreased activity tolerance, Pain, Hypomobility, Impaired flexibility, Improper body mechanics, Decreased strength, Postural dysfunction  Visit Diagnosis: Muscle spasm of back  Chronic midline low back pain without sciatica  Muscle weakness (generalized)     Problem List Patient Active Problem List   Diagnosis Date Noted   Shortness of breath 06/05/2021   Breast pain 06/05/2021   Atypical chest pain 06/05/2021   History of COVID-19 06/05/2021   [name] 04/07/2021   Complex renal cyst 04/07/2021   Chronic right-sided low back pain without sciatica 03/31/2020   Varicose veins of both lower extremities with pain 02/02/2019    Encounter for therapeutic drug level monitoring 09/19/2018   Type 2 diabetes mellitus with diabetic polyneuropathy, without long-term current use of insulin ([place] 03/29/2018   Diabetic neuropathy ([place] 03/29/2018   Chronic pain of left knee 03/23/2018   CAD (coronary artery disease) 08/03/2017   Post herpetic neuralgia 07/29/2016   Dizziness 01/02/2016   Chronic midline low back pain without sciatica 06/06/2015  GERD (gastroesophageal reflux disease) 11/15/2014   Seizure disorder ([place]) 05/07/2014   Allergic rhinitis 05/07/2014   Routine general medical examination at a health care facility 05/07/2014    Scot Jun, PTA 07/02/2021, 12:30 PM  [facility]. [place], [place], [address] Phone: [phone]   Fax:  [phone]  Name: Jaaziah Schulke MRN: [id number] Date of Birth: [birth year]

## 2021-07-03 ENCOUNTER — Encounter: Payer: Self-pay | Admitting: Nurse Practitioner

## 2021-07-03 ENCOUNTER — Other Ambulatory Visit: Payer: Self-pay

## 2021-07-03 ENCOUNTER — Ambulatory Visit (INDEPENDENT_AMBULATORY_CARE_PROVIDER_SITE_OTHER): Payer: PPO | Admitting: Nurse Practitioner

## 2021-07-03 VITALS — BP 108/66 | HR 52 | Temp 96.1°F | Wt 241.2 lb

## 2021-07-03 DIAGNOSIS — B379 Candidiasis, unspecified: Secondary | ICD-10-CM | POA: Diagnosis not present

## 2021-07-03 DIAGNOSIS — L739 Follicular disorder, unspecified: Secondary | ICD-10-CM | POA: Diagnosis not present

## 2021-07-03 MED ORDER — CLOTRIMAZOLE 1 % EX CREA: 1.0000 "application " | TOPICAL_CREAM | Freq: Two times a day (BID) | CUTANEOUS | 0 refills | Status: DC

## 2021-07-03 MED ORDER — FLUCONAZOLE 150 MG PO TABS: 150.0000 mg | ORAL_TABLET | Freq: Once | ORAL | 0 refills | Status: AC

## 2021-07-03 NOTE — Patient Instructions (Signed)
It was great to see you!  Start diflucan 1 pill once. Start clotrimazole cream twice a day until your symptoms resolve.   Follow up if your symptoms don't improve or worsen.   Take care,  Vance Peper, NP

## 2021-07-03 NOTE — Progress Notes (Signed)
Acute Office Visit  Subjective:    Patient ID: Nicholas Rasmussen, male    DOB: 08-13-1945, 76 y.o.   MRN: [id number]  Chief Complaint  Patient presents with   Rash    Pt c/o red, itchy, rash on genitals and lip x1 week.     HPI Patient is in today for rash on genital and bump on his lip for 1 week. He has acyclovir cream that he uses as needed. He used it last week a few times. He noted that the head of his penis was red and slightly swollen. He endorses burning if urine touches it and itching. He does have a history of herpes, however this does not remind him of his normal outbreaks. Denies penile discharge, fevers, dysuria, unprotected sex.   Past Medical History:  Diagnosis Date   Allergic rhinitis 05/07/2014   Dizziness 01/02/2016   GERD (gastroesophageal reflux disease)    Headache 03/13/2015   Left flank pain 01/02/2016   Low back pain 06/06/2015   Pain of left side of body 08/13/2015   Post herpetic neuralgia    Routine general medical examination at a health care facility 05/07/2014   Seizure disorder ([place]) 05/07/2014   Seizures ([place])    Last seizure in the 70s.   Shingles    Stye 05/07/2014    Past Surgical History:  Procedure Laterality Date   COLONOSCOPY     KNEE SURGERY Left    left miniscus   UPPER GASTROINTESTINAL ENDOSCOPY     uvala surgery      Family History  Problem Relation Age of Onset   Diabetes Father    Hypertension Father    Other Mother        natural causes    Social History   Socioeconomic History   Marital status: Single    Spouse name: Not on file   Number of children: 3   Years of education: Associates   Highest education level: Not on file  Occupational History   Occupation: Retired    Comment: Oceanographer  Tobacco Use   Smoking status: Former   Smokeless tobacco: Never  Scientific laboratory technician Use: Never used  Substance and Sexual Activity   Alcohol use: Yes    Alcohol/week: 0.0 standard drinks    Comment: occasional   Drug  use: No   Sexual activity: Not on file  Other Topics Concern   Not on file  Social History Narrative   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   Right-handed.   Rarely drinks caffeine.   Social Determinants of Health   Financial Resource Strain: Low Risk    Difficulty of Paying Living Expenses: Not hard at all  Food Insecurity: No Food Insecurity   Worried About Charity fundraiser in the Last Year: Never true   [place] in the Last Year: Never true  Transportation Needs: No Transportation Needs   Lack of Transportation (Medical): No   Lack of Transportation (Non-Medical): No  Physical Activity: Sufficiently Active   Days of Exercise per Week: 2 days   Minutes of Exercise per Session: 100 min  Stress: No Stress Concern Present   Feeling of Stress : Not at all  Social Connections: Moderately Integrated   Frequency of Communication with Friends and Family: Twice a week   Frequency of Social Gatherings with Friends and Family:  Twice a week   Attends Religious Services: More than 4 times per year   Active Member of Clubs or Organizations: No   Attends Archivist Meetings: Never   Marital Status: Married  Human resources officer Violence: Not At Risk   Fear of Current or Ex-Partner: No   Emotionally Abused: No   Physically Abused: No   Sexually Abused: No    Outpatient Medications Prior to Visit  Medication Sig Dispense Refill   acetaminophen (TYLENOL) 500 MG tablet Take 1 tablet (500 mg total) by mouth every 8 (eight) hours as needed. 30 tablet 0   acyclovir (ZOVIRAX) 200 MG capsule Take 1 capsule (200 mg total) by mouth 5 (five) times daily. 25 capsule 1   atorvastatin (LIPITOR) 20 MG tablet Take 1 tablet (20 mg total) by mouth daily at 6 PM. 90 tablet 1   diclofenac Sodium (VOLTAREN) 1 % GEL Apply a small grape sized dollop to sore joint up to every 6 hours as  needed 150 g 1   divalproex (DEPAKOTE ER) 500 MG 24 hr tablet TAKE 1 TABLET BY MOUTH AT BEDTIME (CALL [phone] FOR APPT) 90 tablet 0   empagliflozin (JARDIANCE) 10 MG TABS tablet Take 1 tablet (10 mg total) by mouth daily. 90 tablet 1   gabapentin (NEURONTIN) 300 MG capsule TAKE 1 CAPSULE BY MOUTH EVERYDAY AT BEDTIME 90 capsule 0   ibuprofen (ADVIL) 800 MG tablet Take 1 tablet (800 mg total) by mouth 3 (three) times daily. 21 tablet 0   meclizine (ANTIVERT) 25 MG tablet Take 1 tablet (25 mg total) by mouth 3 (three) times daily as needed. 10 tablet 0   metoprolol succinate (TOPROL-XL) 25 MG 24 hr tablet Take 1 tablet (25 mg total) by mouth daily. Please keep upcoming appointment in August 2022 for future refills. Thank you 90 tablet 1   omeprazole (PRILOSEC) 20 MG capsule TAKE 1 CAPSULE BY MOUTH DAILY AS NEEDED. 90 capsule 1   No facility-administered medications prior to visit.    No Known Allergies  Review of Systems See pertinent positives and negatives per HPI.    Objective:    Physical Exam Vitals and nursing note reviewed. Exam conducted with a chaperone present.  Constitutional:      Appearance: Normal appearance.  HENT:     Head: Normocephalic.  Eyes:     Conjunctiva/sclera: Conjunctivae normal.  Cardiovascular:     Rate and Rhythm: Normal rate.  Pulmonary:     Effort: Pulmonary effort is normal.  Musculoskeletal:     Cervical back: Normal range of motion.  Skin:    General: Skin is warm.     Comments: Patches of erythema to head and shaft of penis. No drainage noted. Very small pustular pimple to right, top lip  Neurological:     General: No focal deficit present.     Mental Status: He is alert and oriented to person, place, and time.  Psychiatric:        Mood and Affect: Mood normal.        Behavior: Behavior normal.        Thought Content: Thought content normal.        Judgment: Judgment normal.    BP 108/66    Pulse (!) 52    Temp (!) 96.1 F (35.6 C)  (Temporal)    Wt 241 lb 3.2 oz (109.4 kg)    SpO2 96%    BMI 29.36 kg/m  Wt Readings from Last 3 Encounters:  07/03/21  241 lb 3.2 oz (109.4 kg)  06/30/21 240 lb 6.4 oz (109 kg)  06/05/21 243 lb (110.2 kg)    There are no preventive care reminders to display for this patient.  There are no preventive care reminders to display for this patient.   Lab Results  Component Value Date   TSH 1.25 10/08/2019   Lab Results  Component Value Date   WBC 4.9 06/05/2021   HGB 12.9 (L) 06/05/2021   HCT 40.5 06/05/2021   MCV 83.0 06/05/2021   PLT 200.0 06/05/2021   Lab Results  Component Value Date   NA 141 06/12/2021   K 4.0 06/12/2021   CO2 31 06/12/2021   GLUCOSE 104 (H) 06/12/2021   BUN 21 06/12/2021   CREATININE 1.26 06/12/2021   BILITOT 0.3 06/12/2021   ALKPHOS 52 06/12/2021   AST 16 06/12/2021   ALT 6 06/12/2021   PROT 7.2 06/12/2021   ALBUMIN 4.0 06/12/2021   CALCIUM 9.1 06/12/2021   ANIONGAP 10 10/15/2017   EGFR 66 11/18/2020   GFR 56.69 (L) 06/12/2021   Lab Results  Component Value Date   CHOL 137 06/12/2021   Lab Results  Component Value Date   HDL 45.70 06/12/2021   Lab Results  Component Value Date   LDLCALC 68 06/12/2021   Lab Results  Component Value Date   TRIG 117.0 06/12/2021   Lab Results  Component Value Date   CHOLHDL 3 06/12/2021   Lab Results  Component Value Date   HGBA1C 6.5 06/12/2021       Assessment & Plan:   Problem List Items Addressed This Visit       Other   Yeast infection - Primary    Red, irritated patches to head and shaft of penis. His foreskin is able to retract and go back to normal without issues. Will start diflucan $RemoveBefore'150mg'[id number]$  x1 and clotrimazole 1% cream twice a day until resolved. Of note he was recently started on jardiance in August 2022. If he has ongoing issues with yeast infections, this medication may need to be changed. He has not had any yeast infections in the past. Follow up if symptoms worsen or don't  improve.       Relevant Medications   clotrimazole (LOTRIMIN) 1 % cream   fluconazole (DIFLUCAN) 150 MG tablet   Other Visit Diagnoses     Folliculitis       He recently pulled a few hairs out around lip. Does not look infected. Recommend warm compresses 3-4x a day        Meds ordered this encounter  Medications   clotrimazole (LOTRIMIN) 1 % cream    Sig: Apply 1 application topically 2 (two) times daily.    Dispense:  30 g    Refill:  0   fluconazole (DIFLUCAN) 150 MG tablet    Sig: Take 1 tablet (150 mg total) by mouth once for 1 dose.    Dispense:  1 tablet    Refill:  0     Charyl Dancer, NP

## 2021-07-03 NOTE — Assessment & Plan Note (Signed)
Red, irritated patches to head and shaft of penis. His foreskin is able to retract and go back to normal without issues. Will start diflucan 150mg  x1 and clotrimazole 1% cream twice a day until resolved. Of note he was recently started on jardiance in August 2022. If he has ongoing issues with yeast infections, this medication may need to be changed. He has not had any yeast infections in the past. Follow up if symptoms worsen or don't improve.

## 2021-07-06 ENCOUNTER — Ambulatory Visit: Payer: PPO | Admitting: Physical Therapy

## 2021-07-06 ENCOUNTER — Encounter: Payer: Self-pay | Admitting: Physical Therapy

## 2021-07-06 ENCOUNTER — Other Ambulatory Visit: Payer: Self-pay

## 2021-07-06 DIAGNOSIS — M62830 Muscle spasm of back: Secondary | ICD-10-CM

## 2021-07-06 DIAGNOSIS — M545 Low back pain, unspecified: Secondary | ICD-10-CM

## 2021-07-06 DIAGNOSIS — M6281 Muscle weakness (generalized): Secondary | ICD-10-CM

## 2021-07-06 DIAGNOSIS — G8929 Other chronic pain: Secondary | ICD-10-CM

## 2021-07-06 NOTE — Therapy (Signed)
[facility]. [place], [place], [address] Phone: [phone]   Fax:  [phone]  Physical Therapy Treatment  Patient Details  Name: Nicholas Rasmussen MRN: [id number] Date of Birth: [birth year] Referring Provider (PT): Nche   Encounter Date: 07/06/2021   PT End of Session - 07/06/21 1751     Visit Number 5    Number of Visits 13    Date for PT Re-Evaluation 08/04/21    Authorization Type [employer]    Authorization Time Period 06/23/21 to 08/04/21    Progress Note Due on Visit 10    PT Start Time 1705    PT Stop Time 1743    PT Time Calculation (min) 38 min    Activity Tolerance Patient tolerated treatment well    Behavior During Therapy [facility] for tasks assessed/performed             Past Medical History:  Diagnosis Date   Allergic rhinitis 05/07/2014   Dizziness 01/02/2016   GERD (gastroesophageal reflux disease)    Headache 03/13/2015   Left flank pain 01/02/2016   Low back pain 06/06/2015   Pain of left side of body 08/13/2015   Post herpetic neuralgia    Routine general medical examination at a health care facility 05/07/2014   Seizure disorder ([place]) 05/07/2014   Seizures ([place])    Last seizure in the 70s.   Shingles    Stye 05/07/2014    Past Surgical History:  Procedure Laterality Date   COLONOSCOPY     KNEE SURGERY Left    left miniscus   UPPER GASTROINTESTINAL ENDOSCOPY     uvala surgery      There were no vitals filed for this visit.   Subjective Assessment - 07/06/21 1712     Subjective I'm doing OK, I feel like my posture and that's not helping my pain.    Patient Stated Goals get ready for travel, just get feeling better- doing a lot of travelling/driving at the end of March    Currently in Pain? Yes    Pain Score 6     Pain Location Back    Pain Orientation Lower;Medial                               [facility] Adult PT Treatment/Exercise - 07/06/21 0001        Lumbar Exercises: Stretches   Double Knee to Chest Stretch 10 seconds   10 reps   Piriformis Stretch Right;Left;2 reps;30 seconds    Other Lumbar Stretch Exercise lumbar rotations x5 seconds B    Other Lumbar Stretch Exercise prayer stretch 2x30 seconds      Lumbar Exercises: Supine   Dead Bug 10 reps    Bridge 15 reps    Other Supine Lumbar Exercises posterior pelvic tilt with 10 second hold x10; posterior pelvic tilt with marches 1x10 B      Lumbar Exercises: Quadruped   Opposite Arm/Leg Raise 10 reps    Opposite Arm/Leg Raise Limitations with TA set    Other Quadruped Lumbar Exercises cat cow 1x10, thoracic reach throughs 1x10  B                     PT Education - 07/06/21 1751     Education Details exercise form/purpose    Person(s) Educated Patient    Methods Explanation    Comprehension Verbalized understanding  PT Short Term Goals - 07/02/21 1230       PT SHORT TERM GOAL #1   Title Will be independent with appropriate HEP to be progressed PRN    Status Partially Met      PT SHORT TERM GOAL #2   Title Pain to be no more than 2/10 in order to improve QOL and sleep    Status On-going               PT Long Term Goals - 06/23/21 1758       PT LONG TERM GOAL #1   Title MMT to have improved by at least 1 grade in all weak groups to help reduce pain    Time 6    Period Weeks    Status New    Target Date 08/04/21      PT LONG TERM GOAL #2   Title Will be able to tolerate driving for at least 2 hours before needing to take a break without increase in pain so that he can travel to see family more easily    Time 6    Period Weeks    Status New      PT LONG TERM GOAL #3   Title Will demonstrate functionally correct lifting and general biomechanics without increase in pain to help prevent recurrence of symptoms    Time 6    Period Weeks    Status New      PT LONG TERM GOAL #4   Title FOTO score to have improved by at  least 10 points to show general improvement in mobility and function    Time 6    Period Weeks    Status New                   Plan - 07/06/21 1751     Clinical Impression Statement Nicholas Rasmussen arrives today doing well, continues to have pain in his low back; we warmed up on the Nustep, then focused more on functional lumbar mobility and core strength today. Tolerated well, pain at beginning of session 6/10 and at 0/10 EOS. Will continue to progress as able and tolerated.    Personal Factors and Comorbidities Behavior Pattern;Past/Current Experience;Time since onset of injury/illness/exacerbation    Examination-Activity Limitations Locomotion Level;Transfers;Bed Mobility;Sit;Sleep;Carry;Squat;Stairs;Stand;Lift    Examination-Participation Restrictions Cleaning;Community Activity;Driving;Shop;Yard Work    Stability/Clinical Decision Making Stable/Uncomplicated    Clinical Decision Making Low    Rehab Potential Good    PT Frequency 2x / week    PT Duration 6 weeks    PT Treatment/Interventions ADLs/Self Care Home Management;Cryotherapy;Electrical Stimulation;Iontophoresis 4mg /ml Dexamethasone;Moist Heat;Ultrasound;Gait training;Stair training;Functional mobility training;Therapeutic activities;Therapeutic exercise;Balance training;Neuromuscular re-education;Patient/family education;Manual techniques;Passive range of motion;Dry needling;Energy conservation;Taping;Visual/perceptual remediation/compensation    PT Next Visit Plan continue focus on lumbar mobility and core strength, BM for lifting    PT Home Exercise Plan [email or web address]    Consulted and Agree with Plan of Care Patient             Patient will benefit from skilled therapeutic intervention in order to improve the following deficits and impairments:  Decreased range of motion, Increased fascial restricitons, Difficulty walking, Increased muscle spasms, Decreased activity tolerance, Pain, Hypomobility, Impaired flexibility,  Improper body mechanics, Decreased strength, Postural dysfunction  Visit Diagnosis: Muscle spasm of back  Chronic midline low back pain without sciatica  Muscle weakness (generalized)     Problem List Patient Active Problem List   Diagnosis Date Noted   Yeast infection 07/03/2021  Shortness of breath 06/05/2021   Breast pain 06/05/2021   Atypical chest pain 06/05/2021   History of COVID-19 06/05/2021   [name] 04/07/2021   Complex renal cyst 04/07/2021   Chronic right-sided low back pain without sciatica 03/31/2020   Varicose veins of both lower extremities with pain 02/02/2019   Encounter for therapeutic drug level monitoring 09/19/2018   Type 2 diabetes mellitus with diabetic polyneuropathy, without long-term current use of insulin ([place]) 03/29/2018   Diabetic neuropathy ([place]) 03/29/2018   Chronic pain of left knee 03/23/2018   CAD (coronary artery disease) 08/03/2017   Post herpetic neuralgia 07/29/2016   Dizziness 01/02/2016   Chronic midline low back pain without sciatica 06/06/2015   GERD (gastroesophageal reflux disease) 11/15/2014   Seizure disorder ([place]) 05/07/2014   Allergic rhinitis 05/07/2014   Routine general medical examination at a health care facility 05/07/2014   Ann Lions PT, DPT, PN2   Supplemental Physical Therapist [facility] [address] [place], [place], [address] Phone: [phone]   Fax:  [phone]  Name: Nicholas Rasmussen MRN: [id number] Date of Birth: 1945/06/06

## 2021-07-09 ENCOUNTER — Other Ambulatory Visit: Payer: Self-pay | Admitting: Nurse Practitioner

## 2021-07-09 ENCOUNTER — Ambulatory Visit: Payer: PPO | Admitting: Physical Therapy

## 2021-07-09 ENCOUNTER — Telehealth: Payer: Self-pay | Admitting: Nurse Practitioner

## 2021-07-09 DIAGNOSIS — K2100 Gastro-esophageal reflux disease with esophagitis, without bleeding: Secondary | ICD-10-CM

## 2021-07-09 NOTE — Telephone Encounter (Signed)
Pt called about Jardiance and one of the side effects is yeast infection around the genitals and he said he got something prescribed something for it by Lauren when he was seen on 07/03/21 and he was wondering if he needs to continue Jardiance. Please advise. Call back [phone]

## 2021-07-13 NOTE — Telephone Encounter (Signed)
Patient notified and verbalized understanding. 

## 2021-07-14 ENCOUNTER — Ambulatory Visit: Payer: PPO | Admitting: Physical Therapy

## 2021-07-14 ENCOUNTER — Encounter: Payer: Self-pay | Admitting: Physical Therapy

## 2021-07-14 ENCOUNTER — Other Ambulatory Visit: Payer: Self-pay

## 2021-07-14 DIAGNOSIS — M545 Low back pain, unspecified: Secondary | ICD-10-CM | POA: Diagnosis not present

## 2021-07-14 DIAGNOSIS — M62830 Muscle spasm of back: Secondary | ICD-10-CM

## 2021-07-14 DIAGNOSIS — M6281 Muscle weakness (generalized): Secondary | ICD-10-CM

## 2021-07-14 DIAGNOSIS — G8929 Other chronic pain: Secondary | ICD-10-CM

## 2021-07-14 NOTE — Therapy (Signed)
[facility]. [place], [place], [address] Phone: [phone]   Fax:  [phone]  Physical Therapy Treatment  Patient Details  Name: Nicholas Rasmussen MRN: [id number] Date of Birth: [birth year] Referring Provider (PT): Nche   Encounter Date: 07/14/2021   PT End of Session - 07/14/21 1603     Visit Number 6    Date for PT Re-Evaluation 08/04/21    Authorization Type [employer]    Authorization Time Period 06/23/21 to 08/04/21    PT Start Time 1515    PT Stop Time 1600    PT Time Calculation (min) 45 min    Activity Tolerance Patient tolerated treatment well    Behavior During Therapy [facility] for tasks assessed/performed             Past Medical History:  Diagnosis Date   Allergic rhinitis 05/07/2014   Dizziness 01/02/2016   GERD (gastroesophageal reflux disease)    Headache 03/13/2015   Left flank pain 01/02/2016   Low back pain 06/06/2015   Pain of left side of body 08/13/2015   Post herpetic neuralgia    Routine general medical examination at a health care facility 05/07/2014   Seizure disorder ([place]) 05/07/2014   Seizures ([place])    Last seizure in the 70s.   Shingles    Stye 05/07/2014    Past Surgical History:  Procedure Laterality Date   COLONOSCOPY     KNEE SURGERY Left    left miniscus   UPPER GASTROINTESTINAL ENDOSCOPY     uvala surgery      There were no vitals filed for this visit.   Subjective Assessment - 07/14/21 1515     Subjective "I guess I does" Sleepy    Currently in Pain? Yes    Pain Score 6     Pain Location Back                               [facility] Adult PT Treatment/Exercise - 07/14/21 0001       Lumbar Exercises: Stretches   Active Hamstring Stretch Left;Right;3 reps;10 seconds;20 seconds      Lumbar Exercises: Aerobic   Nustep L 5 6 min      Lumbar Exercises: Machines for Strengthening   Cybex Knee Extension 10# 2 sets 12    Cybex Knee  Flexion 35lb 2x12    Other Lumbar Machine Exercise lat pull down and rows 2 sets 10 35#    Other Lumbar Machine Exercise Shoulder Ext 15lb 2x10      Lumbar Exercises: Seated   Sit to Stand 10 reps   x2 OHP yellow ball     Lumbar Exercises: Supine   Bridge 15 reps    Other Supine Lumbar Exercises Bilat SLRfor core 2x10      Lumbar Exercises: Prone   Other Prone Lumbar Exercises Planks 15''' x3                       PT Short Term Goals - 07/02/21 1230       PT SHORT TERM GOAL #1   Title Will be independent with appropriate HEP to be progressed PRN    Status Partially Met      PT SHORT TERM GOAL #2   Title Pain to be no more than 2/10 in order to improve QOL and sleep    Status On-going  PT Long Term Goals - 06/23/21 1758       PT LONG TERM GOAL #1   Title MMT to have improved by at least 1 grade in all weak groups to help reduce pain    Time 6    Period Weeks    Status New    [place] Date 08/04/21      PT LONG TERM GOAL #2   Title Will be able to tolerate driving for at least 2 hours before needing to take a break without increase in pain so that he can travel to see family more easily    Time 6    Period Weeks    Status New      PT LONG TERM GOAL #3   Title Will demonstrate functionally correct lifting and general biomechanics without increase in pain to help prevent recurrence of symptoms    Time 6    Period Weeks    Status New      PT LONG TERM GOAL #4   Title FOTO score to have improved by at least 10 points to show general improvement in mobility and function    Time 6    Period Weeks    Status New                   Plan - 07/14/21 1603     Clinical Impression Statement Pt enters clinic reporting some fatigue with 6/10 low back pain. Pt able to complete interventions without any increase in back pain. Core weakness present with prone planks and bilateral SLR. Increase resistance tolerated with standing shoulder ext  although tactile cues provided with prevent trunk flexion. Fatigue reported with sit to stands. Will continue to treat pt to increase postural and core strength.    Personal Factors and Comorbidities Behavior Pattern;Past/Current Experience;Time since onset of injury/illness/exacerbation    Examination-Activity Limitations Locomotion Level;Transfers;Bed Mobility;Sit;Sleep;Carry;Squat;Stairs;Stand;Lift    Examination-Participation Restrictions Cleaning;Community Activity;Driving;Shop;Yard Work    Stability/Clinical Decision Making Stable/Uncomplicated    Rehab Potential Good    PT Frequency 2x / week    PT Treatment/Interventions ADLs/Self Care Home Management;Cryotherapy;Electrical Stimulation;Iontophoresis 45m/ml Dexamethasone;Moist Heat;Ultrasound;Gait training;Stair training;Functional mobility training;Therapeutic activities;Therapeutic exercise;Balance training;Neuromuscular re-education;Patient/family education;Manual techniques;Passive range of motion;Dry needling;Energy conservation;Taping;Visual/perceptual remediation/compensation    PT Next Visit Plan continue focus on lumbar mobility and core strength, BM for lifting             Patient will benefit from skilled therapeutic intervention in order to improve the following deficits and impairments:  Decreased range of motion, Increased fascial restricitons, Difficulty walking, Increased muscle spasms, Decreased activity tolerance, Pain, Hypomobility, Impaired flexibility, Improper body mechanics, Decreased strength, Postural dysfunction  Visit Diagnosis: Muscle spasm of back  Chronic midline low back pain without sciatica  Muscle weakness (generalized)     Problem List Patient Active Problem List   Diagnosis Date Noted   Yeast infection 07/03/2021   Shortness of breath 06/05/2021   Breast pain 06/05/2021   Atypical chest pain 06/05/2021   History of COVID-19 06/05/2021   [name]  04/07/2021   Complex renal cyst 04/07/2021   Chronic right-sided low back pain without sciatica 03/31/2020   Varicose veins of both lower extremities with pain 02/02/2019   Encounter for therapeutic drug level monitoring 09/19/2018   Type 2 diabetes mellitus with diabetic polyneuropathy, without long-term current use of insulin ([place] 03/29/2018   Diabetic neuropathy ([place] 03/29/2018   Chronic pain of left knee 03/23/2018   CAD (coronary  artery disease) 08/03/2017   Post herpetic neuralgia 07/29/2016   Dizziness 01/02/2016   Chronic midline low back pain without sciatica 06/06/2015   GERD (gastroesophageal reflux disease) 11/15/2014   Seizure disorder ([place]) 05/07/2014   Allergic rhinitis 05/07/2014   Routine general medical examination at a health care facility 05/07/2014    Scot Jun, PTA 07/14/2021, 4:11 PM  [facility]. [place], [place], [address] Phone: [phone]   Fax:  [phone]  Name: Salem Lembke MRN: [id number] Date of Birth: 1946-05-10

## 2021-07-16 ENCOUNTER — Other Ambulatory Visit: Payer: Self-pay

## 2021-07-16 ENCOUNTER — Encounter: Payer: Self-pay | Admitting: Physical Therapy

## 2021-07-16 ENCOUNTER — Ambulatory Visit: Payer: PPO | Admitting: Physical Therapy

## 2021-07-16 DIAGNOSIS — M62830 Muscle spasm of back: Secondary | ICD-10-CM

## 2021-07-16 DIAGNOSIS — G8929 Other chronic pain: Secondary | ICD-10-CM

## 2021-07-16 DIAGNOSIS — M545 Low back pain, unspecified: Secondary | ICD-10-CM

## 2021-07-16 DIAGNOSIS — M6281 Muscle weakness (generalized): Secondary | ICD-10-CM

## 2021-07-16 NOTE — Therapy (Signed)
[place]. [place], [place], [address] Phone: [phone]   Fax:  [phone]  Physical Therapy Treatment  Patient Details  Name: Nicholas Rasmussen MRN: [id number] Date of Birth: 1946-02-04 Referring Provider (PT): Nche   Encounter Date: 07/16/2021   PT End of Session - 07/16/21 1425     Visit Number 7    Number of Visits 13    Date for PT Re-Evaluation 08/04/21    Authorization Type [employer]    PT Stop Time 1429    Activity Tolerance Patient tolerated treatment well    Behavior During Therapy [facility] for tasks assessed/performed             Past Medical History:  Diagnosis Date   Allergic rhinitis 05/07/2014   Dizziness 01/02/2016   GERD (gastroesophageal reflux disease)    Headache 03/13/2015   Left flank pain 01/02/2016   Low back pain 06/06/2015   Pain of left side of body 08/13/2015   Post herpetic neuralgia    Routine general medical examination at a health care facility 05/07/2014   Seizure disorder ([place]) 05/07/2014   Seizures ([place])    Last seizure in the 70s.   Shingles    Stye 05/07/2014    Past Surgical History:  Procedure Laterality Date   COLONOSCOPY     KNEE SURGERY Left    left miniscus   UPPER GASTROINTESTINAL ENDOSCOPY     uvala surgery      There were no vitals filed for this visit.   Subjective Assessment - 07/16/21 1344     Subjective All in all about a 5 maybe a 4    Currently in Pain? Yes    Pain Score 4     Pain Location Back                               [facility] Adult PT Treatment/Exercise - 07/16/21 0001       Lumbar Exercises: Stretches   Single Knee to Chest Stretch Left;Right;3 reps;10 seconds    Double Knee to Chest Stretch 2 reps;10 seconds      Lumbar Exercises: Aerobic   Elliptical L1.5 x3 min    Nustep L 5 5 min      Lumbar Exercises: Machines for Strengthening   Leg Press 60lv 2x10    Other Lumbar Machine Exercise lat  pull down and rows 2 sets 15 35#    Other Lumbar Machine Exercise Shoulder Ext 15lb 2x10      Lumbar Exercises: Standing   Row Strengthening;Power tower;20 reps;Both    Row Limitations 20    Other Standing Lumbar Exercises AR press 20lb x10 each      Lumbar Exercises: Supine   Bridge 15 reps    Single Leg Bridge Compliant;10 reps;2 seconds    Other Supine Lumbar Exercises feet on ball bridge,KTC and obl                       PT Short Term Goals - 07/02/21 1230       PT SHORT TERM GOAL #1   Title Will be independent with appropriate HEP to be progressed PRN    Status Partially Met      PT SHORT TERM GOAL #2   Title Pain to be no more than 2/10 in order to improve QOL and sleep    Status On-going  PT Long Term Goals - 06/23/21 1758       PT LONG TERM GOAL #1   Title MMT to have improved by at least 1 grade in all weak groups to help reduce pain    Time 6    Period Weeks    Status New    Target Date 08/04/21      PT LONG TERM GOAL #2   Title Will be able to tolerate driving for at least 2 hours before needing to take a break without increase in pain so that he can travel to see family more easily    Time 6    Period Weeks    Status New      PT LONG TERM GOAL #3   Title Will demonstrate functionally correct lifting and general biomechanics without increase in pain to help prevent recurrence of symptoms    Time 6    Period Weeks    Status New      PT LONG TERM GOAL #4   Title FOTO score to have improved by at least 10 points to show general improvement in mobility and function    Time 6    Period Weeks    Status New                   Plan - 07/16/21 1425     Clinical Impression Statement Pt enters clinic with reports of his normal pain. He did well progressing with all interventions. increase resistance tolerated with AR press, but required cues to keep resistance centered. Some core fatigue noted with supine  interventions. no issue with the addition of leg press.    Personal Factors and Comorbidities Behavior Pattern;Past/Current Experience;Time since onset of injury/illness/exacerbation    Examination-Activity Limitations Locomotion Level;Transfers;Bed Mobility;Sit;Sleep;Carry;Squat;Stairs;Stand;Lift    Examination-Participation Restrictions Cleaning;Community Activity;Driving;Shop;Yard Work    Stability/Clinical Decision Making Stable/Uncomplicated    Rehab Potential Good    PT Frequency 2x / week    PT Duration 6 weeks    PT Treatment/Interventions ADLs/Self Care Home Management;Cryotherapy;Electrical Stimulation;Iontophoresis 74m/ml Dexamethasone;Moist Heat;Ultrasound;Gait training;Stair training;Functional mobility training;Therapeutic activities;Therapeutic exercise;Balance training;Neuromuscular re-education;Patient/family education;Manual techniques;Passive range of motion;Dry needling;Energy conservation;Taping;Visual/perceptual remediation/compensation    PT Next Visit Plan continue focus on lumbar mobility and core strength, BM for lifting             Patient will benefit from skilled therapeutic intervention in order to improve the following deficits and impairments:  Decreased range of motion, Increased fascial restricitons, Difficulty walking, Increased muscle spasms, Decreased activity tolerance, Pain, Hypomobility, Impaired flexibility, Improper body mechanics, Decreased strength, Postural dysfunction  Visit Diagnosis: Muscle spasm of back  Chronic midline low back pain without sciatica  Muscle weakness (generalized)     Problem List Patient Active Problem List   Diagnosis Date Noted   Yeast infection 07/03/2021   Shortness of breath 06/05/2021   Breast pain 06/05/2021   Atypical chest pain 06/05/2021   History of COVID-19 06/05/2021   [name] 04/07/2021   Complex renal cyst 04/07/2021   Chronic right-sided low back pain  without sciatica 03/31/2020   Varicose veins of both lower extremities with pain 02/02/2019   Encounter for therapeutic drug level monitoring 09/19/2018   Type 2 diabetes mellitus with diabetic polyneuropathy, without long-term current use of insulin ([place] 03/29/2018   Diabetic neuropathy ([place] 03/29/2018   Chronic pain of left knee 03/23/2018   CAD (coronary artery disease) 08/03/2017   Post herpetic neuralgia 07/29/2016   Dizziness  01/02/2016   Chronic midline low back pain without sciatica 06/06/2015   GERD (gastroesophageal reflux disease) 11/15/2014   Seizure disorder ([place]) 05/07/2014   Allergic rhinitis 05/07/2014   Routine general medical examination at a health care facility 05/07/2014    Scot Jun, PTA 07/16/2021, 2:29 PM  [place]. [place], [place], [address] Phone: [phone]   Fax:  [phone]  Name: Nicholas Rasmussen MRN: [id number] Date of Birth: 1945-06-12

## 2021-07-20 ENCOUNTER — Encounter: Payer: Self-pay | Admitting: Physical Therapy

## 2021-07-20 ENCOUNTER — Other Ambulatory Visit: Payer: Self-pay

## 2021-07-20 ENCOUNTER — Ambulatory Visit: Payer: PPO | Admitting: Physical Therapy

## 2021-07-20 DIAGNOSIS — M545 Low back pain, unspecified: Secondary | ICD-10-CM

## 2021-07-20 DIAGNOSIS — M62830 Muscle spasm of back: Secondary | ICD-10-CM

## 2021-07-20 DIAGNOSIS — M6281 Muscle weakness (generalized): Secondary | ICD-10-CM

## 2021-07-20 DIAGNOSIS — G8929 Other chronic pain: Secondary | ICD-10-CM

## 2021-07-20 NOTE — Therapy (Signed)
[place]. [place], [place], [address] Phone: [phone]   Fax:  [phone]  Physical Therapy Treatment  Patient Details  Name: Nicholas Rasmussen MRN: [id number] Date of Birth: 1945-08-30 Referring Provider (PT): Nche   Encounter Date: 07/20/2021   PT End of Session - 07/20/21 1007     Visit Number 8    Date for PT Re-Evaluation 08/04/21    Authorization Type [employer]    Authorization Time Period 06/23/21 to 08/04/21    PT Start Time 0930    PT Stop Time 1015    PT Time Calculation (min) 45 min    Activity Tolerance Patient tolerated treatment well    Behavior During Therapy [facility] for tasks assessed/performed             Past Medical History:  Diagnosis Date   Allergic rhinitis 05/07/2014   Dizziness 01/02/2016   GERD (gastroesophageal reflux disease)    Headache 03/13/2015   Left flank pain 01/02/2016   Low back pain 06/06/2015   Pain of left side of body 08/13/2015   Post herpetic neuralgia    Routine general medical examination at a health care facility 05/07/2014   Seizure disorder ([place]) 05/07/2014   Seizures ([place])    Last seizure in the 70s.   Shingles    Stye 05/07/2014    Past Surgical History:  Procedure Laterality Date   COLONOSCOPY     KNEE SURGERY Left    left miniscus   UPPER GASTROINTESTINAL ENDOSCOPY     uvala surgery      There were no vitals filed for this visit.   Subjective Assessment - 07/20/21 0932     Subjective "Pretty good considering"    Currently in Pain? Yes    Pain Score 5     Pain Location Back                               [facility] Adult PT Treatment/Exercise - 07/20/21 0001       Lumbar Exercises: Stretches   Single Knee to Chest Stretch Left;Right;3 reps;10 seconds    Double Knee to Chest Stretch 2 reps;10 seconds      Lumbar Exercises: Aerobic   Elliptical L3 x3 min    Nustep L 5 4 min      Lumbar Exercises: Machines  for Strengthening   Cybex Knee Extension 15lb 2x10    Cybex Knee Flexion 45lb 2x10    Leg Press 60lb 2x15    Other Lumbar Machine Exercise Shoulder Ext 15lb 2x10      Lumbar Exercises: Standing   Row Strengthening;Power tower;20 reps;Both    Row Limitations 20    Other Standing Lumbar Exercises Hip Ext 5 2x10, yelow ball overhead Ext 2x10      Lumbar Exercises: Supine   Other Supine Lumbar Exercises feet on ball bridge,KTC and obl                       PT Short Term Goals - 07/02/21 1230       PT SHORT TERM GOAL #1   Title Will be independent with appropriate HEP to be progressed PRN    Status Partially Met      PT SHORT TERM GOAL #2   Title Pain to be no more than 2/10 in order to improve QOL and sleep    Status On-going  PT Long Term Goals - 06/23/21 1758       PT LONG TERM GOAL #1   Title MMT to have improved by at least 1 grade in all weak groups to help reduce pain    Time 6    Period Weeks    Status New    [place] Date 08/04/21      PT LONG TERM GOAL #2   Title Will be able to tolerate driving for at least 2 hours before needing to take a break without increase in pain so that he can travel to see family more easily    Time 6    Period Weeks    Status New      PT LONG TERM GOAL #3   Title Will demonstrate functionally correct lifting and general biomechanics without increase in pain to help prevent recurrence of symptoms    Time 6    Period Weeks    Status New      PT LONG TERM GOAL #4   Title FOTO score to have improved by at least 10 points to show general improvement in mobility and function    Time 6    Period Weeks    Status New                   Plan - 07/20/21 1007     Clinical Impression Statement Pt enters clinic reporting his normal tolerable back pain. Pain does have it moments depending on movements. Additional focus place on lumber extensors without issue. Increase reps and or resistance tolerated  with machine level interventions. No reports of increase pain during session.    Personal Factors and Comorbidities Behavior Pattern;Past/Current Experience;Time since onset of injury/illness/exacerbation    Examination-Activity Limitations Locomotion Level;Transfers;Bed Mobility;Sit;Sleep;Carry;Squat;Stairs;Stand;Lift    Clinical Decision Making Low    Rehab Potential Good    PT Frequency 1x / week    PT Treatment/Interventions ADLs/Self Care Home Management;Cryotherapy;Electrical Stimulation;Iontophoresis 3m/ml Dexamethasone;Moist Heat;Ultrasound;Gait training;Stair training;Functional mobility training;Therapeutic activities;Therapeutic exercise;Balance training;Neuromuscular re-education;Patient/family education;Manual techniques;Passive range of motion;Dry needling;Energy conservation;Taping;Visual/perceptual remediation/compensation    PT Next Visit Plan continue focus on lumbar mobility and core strength, BM for lifting             Patient will benefit from skilled therapeutic intervention in order to improve the following deficits and impairments:     Visit Diagnosis: Muscle spasm of back  Chronic midline low back pain without sciatica  Muscle weakness (generalized)     Problem List Patient Active Problem List   Diagnosis Date Noted   Yeast infection 07/03/2021   Shortness of breath 06/05/2021   Breast pain 06/05/2021   Atypical chest pain 06/05/2021   History of COVID-19 06/05/2021   [name] 04/07/2021   Complex renal cyst 04/07/2021   Chronic right-sided low back pain without sciatica 03/31/2020   Varicose veins of both lower extremities with pain 02/02/2019   Encounter for therapeutic drug level monitoring 09/19/2018   Type 2 diabetes mellitus with diabetic polyneuropathy, without long-term current use of insulin ([place] 03/29/2018   Diabetic neuropathy ([place] 03/29/2018   Chronic pain of left knee 03/23/2018   CAD  (coronary artery disease) 08/03/2017   Post herpetic neuralgia 07/29/2016   Dizziness 01/02/2016   Chronic midline low back pain without sciatica 06/06/2015   GERD (gastroesophageal reflux disease) 11/15/2014   Seizure disorder ([place] 05/07/2014   Allergic rhinitis 05/07/2014   Routine general medical examination at a health care facility 05/07/2014  Scot Jun, PTA 07/20/2021, 10:10 AM  [place]. [place], [place], [address] Phone: [phone]   Fax:  [phone]  Name: Jaken Fregia MRN: [id number] Date of Birth: 25-Sep-1945

## 2021-07-23 ENCOUNTER — Other Ambulatory Visit: Payer: Self-pay

## 2021-07-23 ENCOUNTER — Encounter: Payer: Self-pay | Admitting: Physical Therapy

## 2021-07-23 ENCOUNTER — Ambulatory Visit: Payer: PPO | Attending: Nurse Practitioner | Admitting: Physical Therapy

## 2021-07-23 DIAGNOSIS — M79605 Pain in left leg: Secondary | ICD-10-CM | POA: Insufficient documentation

## 2021-07-23 DIAGNOSIS — M545 Low back pain, unspecified: Secondary | ICD-10-CM | POA: Insufficient documentation

## 2021-07-23 DIAGNOSIS — G8929 Other chronic pain: Secondary | ICD-10-CM | POA: Diagnosis present

## 2021-07-23 DIAGNOSIS — M62830 Muscle spasm of back: Secondary | ICD-10-CM | POA: Diagnosis present

## 2021-07-23 DIAGNOSIS — M6281 Muscle weakness (generalized): Secondary | ICD-10-CM | POA: Diagnosis present

## 2021-07-23 NOTE — Therapy (Signed)
[place] ?[place] ?[address] ?[place], [place], [address] ?Phone: [phone]   Fax:  [phone] ? ?Physical Therapy Treatment ? ?Patient Details  ?Name: Nicholas Rasmussen ?MRN: [id number] ?Date of Birth: [birth year] ?Referring Provider (PT): Nche ? ? ?Encounter Date: 07/23/2021 ? ? PT End of Session - 07/23/21 1422   ? ? Visit Number 9   ? Number of Visits 13   ? Date for PT Re-Evaluation 08/04/21   ? Authorization Type [employer]   ? Authorization Time Period 06/23/21 to 08/04/21   ? PT Start Time 1345   ? PT Stop Time 8333   ? PT Time Calculation (min) 64 min   ? Activity Tolerance Patient tolerated treatment well   ? Behavior During Therapy [facility] for tasks assessed/performed   ? ?  ?  ? ?  ? ? ?Past Medical History:  ?Diagnosis Date  ? Allergic rhinitis 05/07/2014  ? Dizziness 01/02/2016  ? GERD (gastroesophageal reflux disease)   ? Headache 03/13/2015  ? Left flank pain 01/02/2016  ? Low back pain 06/06/2015  ? Pain of left side of body 08/13/2015  ? Post herpetic neuralgia   ? Routine general medical examination at a health care facility 05/07/2014  ? Seizure disorder ([place]) 05/07/2014  ? Seizures ([place])   ? Last seizure in the 70s.  ? Shingles   ? Stye 05/07/2014  ? ? ?Past Surgical History:  ?Procedure Laterality Date  ? COLONOSCOPY    ? KNEE SURGERY Left   ? left miniscus  ? UPPER GASTROINTESTINAL ENDOSCOPY    ? uvala surgery    ? ? ?There were no vitals filed for this visit. ? ? Subjective Assessment - 07/23/21 1342   ? ? Subjective "Ok" not too bad   ? Currently in Pain? Yes   ? Pain Score 4    ? ?  ?  ? ?  ? ? ? ? ? ? ? ? ? ? ? ? ? ? ? ? ? ? ? ? [facility] Adult PT Treatment/Exercise - 07/23/21 0001   ? ?  ? Lumbar Exercises: Aerobic  ? Elliptical L3 x6  min   ?  ? Lumbar Exercises: Machines for Strengthening  ? Cybex Knee Extension 15lb 2x12   ? Cybex Knee Flexion 45lb 2x12   ? Other Lumbar Machine Exercise lat pull down and rows 2 sets 12 45#   ? Other Lumbar  Machine Exercise Shoulder Ext 15lb 2x10   ?  ? Lumbar Exercises: Standing  ? Other Standing Lumbar Exercises Hip Ext 10 2x10, yelow ball overhead Ext 2x10   ?  ? Lumbar Exercises: Seated  ? Sit to Stand 10 reps   x2 OHP yellow ball  ?  ? Lumbar Exercises: Supine  ? Ab Set 15 reps;3 seconds   ? Bridge Compliant;10 reps;2 seconds   ? Other Supine Lumbar Exercises feet on ball bridge,KTC and obl   ? ?  ?  ? ?  ? ? ? ? ? ? ? ? ? ? ? ? PT Short Term Goals - 07/02/21 1230   ? ?  ? PT SHORT TERM GOAL #1  ? Title Will be independent with appropriate HEP to be progressed PRN   ? Status Partially Met   ?  ? PT SHORT TERM GOAL #2  ? Title Pain to be no more than 2/10 in order to improve QOL and sleep   ? Status On-going   ? ?  ?  ? ?  ? ? ? ?  PT Long Term Goals - 06/23/21 1758   ? ?  ? PT LONG TERM GOAL #1  ? Title MMT to have improved by at least 1 grade in all weak groups to help reduce pain   ? Time 6   ? Period Weeks   ? Status New   ? [place] Date 08/04/21   ?  ? PT LONG TERM GOAL #2  ? Title Will be able to tolerate driving for at least 2 hours before needing to take a break without increase in pain so that he can travel to see family more easily   ? Time 6   ? Period Weeks   ? Status New   ?  ? PT LONG TERM GOAL #3  ? Title Will demonstrate functionally correct lifting and general biomechanics without increase in pain to help prevent recurrence of symptoms   ? Time 6   ? Period Weeks   ? Status New   ?  ? PT LONG TERM GOAL #4  ? Title FOTO score to have improved by at least 10 points to show general improvement in mobility and function   ? Time 6   ? Period Weeks   ? Status New   ? ?  ?  ? ?  ? ? ? ? ? ? ? ? Plan - 07/23/21 1422   ? ? Clinical Impression Statement Pt is progressing towards LTG's. Pt enters clinic reporting a slight decrease in his back pain. no increase in pain with the extended tim on elliptical warm up. Good strength and ROM throughout session with the interventions. Come reports of fatigue with sit to  stands.   ? Personal Factors and Comorbidities Behavior Pattern;Past/Current Experience;Time since onset of injury/illness/exacerbation   ? Examination-Activity Limitations Locomotion Level;Transfers;Bed Mobility;Sit;Sleep;Carry;Squat;Stairs;Stand;Lift   ? Examination-Participation Restrictions Cleaning;Community Activity;Driving;Shop;Yard Work   ? Rehab Potential Good   ? PT Frequency 1x / week   ? PT Duration 6 weeks   ? PT Treatment/Interventions ADLs/Self Care Home Management;Cryotherapy;Electrical Stimulation;Iontophoresis 4mg /ml Dexamethasone;Moist Heat;Ultrasound;Gait training;Stair training;Functional mobility training;Therapeutic activities;Therapeutic exercise;Balance training;Neuromuscular re-education;Patient/family education;Manual techniques;Passive range of motion;Dry needling;Energy conservation;Taping;Visual/perceptual remediation/compensation   ? PT Next Visit Plan continue focus on lumbar mobility and core strength, BM for lifting   ? ?  ?  ? ?  ? ? ?Patient will benefit from skilled therapeutic intervention in order to improve the following deficits and impairments:  Decreased range of motion, Increased fascial restricitons, Difficulty walking, Increased muscle spasms, Decreased activity tolerance, Pain, Hypomobility, Impaired flexibility, Improper body mechanics, Decreased strength, Postural dysfunction ? ?Visit Diagnosis: ?Muscle spasm of back ? ?Muscle weakness (generalized) ? ?Chronic midline low back pain without sciatica ? ? ? ? ?Problem List ?Patient Active Problem List  ? Diagnosis Date Noted  ? Yeast infection 07/03/2021  ? Shortness of breath 06/05/2021  ? Breast pain 06/05/2021  ? Atypical chest pain 06/05/2021  ? History of COVID-19 06/05/2021  ? [name] 04/07/2021  ? Complex renal cyst 04/07/2021  ? Chronic right-sided low back pain without sciatica 03/31/2020  ? Varicose veins of both lower extremities with pain 02/02/2019  ? Encounter for  therapeutic drug level monitoring 09/19/2018  ? Type 2 diabetes mellitus with diabetic polyneuropathy, without long-term current use of insulin ([place]) 03/29/2018  ? Diabetic neuropathy ([place]) 03/29/2018  ? Chronic pain of left knee 03/23/2018  ? CAD (coronary artery disease) 08/03/2017  ? Post herpetic neuralgia 07/29/2016  ? Dizziness 01/02/2016  ? Chronic midline low  back pain without sciatica 06/06/2015  ? GERD (gastroesophageal reflux disease) 11/15/2014  ? Seizure disorder ([place]) 05/07/2014  ? Allergic rhinitis 05/07/2014  ? Routine general medical examination at a health care facility 05/07/2014  ? ? ?Scot Jun, PTA ?07/23/2021, 2:24 PM ? ?[place] ?[address] ?[address] ?[place], [place], [address] ?Phone: [phone]   Fax:  [phone] ? ?Name: Nicholas Rasmussen ?MRN: [id number] ?Date of Birth: November 23, 1945 ? ? ? ?

## 2021-07-28 ENCOUNTER — Ambulatory Visit: Payer: PPO | Admitting: Physical Therapy

## 2021-07-28 ENCOUNTER — Other Ambulatory Visit: Payer: Self-pay

## 2021-07-28 ENCOUNTER — Encounter: Payer: Self-pay | Admitting: Physical Therapy

## 2021-07-28 DIAGNOSIS — M62830 Muscle spasm of back: Secondary | ICD-10-CM

## 2021-07-28 DIAGNOSIS — G8929 Other chronic pain: Secondary | ICD-10-CM

## 2021-07-28 DIAGNOSIS — M545 Low back pain, unspecified: Secondary | ICD-10-CM

## 2021-07-28 DIAGNOSIS — M6281 Muscle weakness (generalized): Secondary | ICD-10-CM

## 2021-07-28 DIAGNOSIS — M79605 Pain in left leg: Secondary | ICD-10-CM

## 2021-07-28 NOTE — Therapy (Signed)
[facility] ?[address] ?[place], [place], [address] ?Phone: [phone]   Fax:  [phone] ?Progress Note ?Reporting Period 06/23/21 to 07/28/21 ? ?See note below for Objective Data and Assessment of Progress/Goals.  ? ?  ?Physical Therapy Treatment ? ?Patient Details  ?Name: Nicholas Rasmussen ?MRN: [id number] ?Date of Birth: 1946-03-19 ?Referring Provider (PT): Nche ? ? ?Encounter Date: 07/28/2021 ? ? PT End of Session - 07/28/21 1424   ? ? Visit Number 10   ? Date for PT Re-Evaluation 08/04/21   ? Authorization Time Period 06/23/21 to 08/04/21   ? PT Start Time 0109   ? PT Stop Time 1426   ? PT Time Calculation (min) 34 min   ? Activity Tolerance Patient tolerated treatment well   ? Behavior During Therapy [facility] for tasks assessed/performed   ? ?  ?  ? ?  ? ? ?Past Medical History:  ?Diagnosis Date  ? Allergic rhinitis 05/07/2014  ? Dizziness 01/02/2016  ? GERD (gastroesophageal reflux disease)   ? Headache 03/13/2015  ? Left flank pain 01/02/2016  ? Low back pain 06/06/2015  ? Pain of left side of body 08/13/2015  ? Post herpetic neuralgia   ? Routine general medical examination at a health care facility 05/07/2014  ? Seizure disorder ([place]) 05/07/2014  ? Seizures ([place])   ? Last seizure in the 70s.  ? Shingles   ? Stye 05/07/2014  ? ? ?Past Surgical History:  ?Procedure Laterality Date  ? COLONOSCOPY    ? KNEE SURGERY Left   ? left miniscus  ? UPPER GASTROINTESTINAL ENDOSCOPY    ? uvala surgery    ? ? ?There were no vitals filed for this visit. ? ? Subjective Assessment - 07/28/21 1352   ? ? Subjective "Doing ok"   ? Currently in Pain? Yes   ? Pain Score 4    ? Pain Location Back   ? ?  ?  ? ?  ? ? ? ? ? [facility] PT Assessment - 07/28/21 0001   ? ?  ? Strength  ? Left Hip Extension 4/5   ? ?  ?  ? ?  ? ? ? ? ? ? ? ? ? ? ? ? ? ? ? ? [facility] Adult PT Treatment/Exercise - 07/28/21 0001   ? ?  ? Lumbar Exercises: Aerobic  ? Elliptical L4 x6  min   ?  ? Lumbar Exercises: Machines for  Strengthening  ? Leg Press 80lb 2x15   ? Other Lumbar Machine Exercise lat pull down and rows 2 sets 12 45#   ? Other Lumbar Machine Exercise Shoulder Ext 10lb 2x15   ?  ? Lumbar Exercises: Standing  ? Other Standing Lumbar Exercises Overhead Ext yellow 2x10   ?  ? Lumbar Exercises: Supine  ? Bridge Compliant;10 reps;2 seconds   ? Single Leg Bridge Compliant;10 reps;2 seconds   ? Other Supine Lumbar Exercises feet on ball bridge,KTC and obl   ? ?  ?  ? ?  ? ? ? ? ? ? ? ? ? ? ? ? PT Short Term Goals - 07/02/21 1230   ? ?  ? PT SHORT TERM GOAL #1  ? Title Will be independent with appropriate HEP to be progressed PRN   ? Status Partially Met   ?  ? PT SHORT TERM GOAL #2  ? Title Pain to be no more than 2/10 in order to improve QOL and sleep   ?  Status On-going   ? ?  ?  ? ?  ? ? ? ? PT Long Term Goals - 07/28/21 1426   ? ?  ? PT LONG TERM GOAL #1  ? Title MMT to have improved by at least 1 grade in all weak groups to help reduce pain   ? Status Partially Met   ?  ? PT LONG TERM GOAL #2  ? Title Will be able to tolerate driving for at least 2 hours before needing to take a break without increase in pain so that he can travel to see family more easily   ? Status Partially Met   ?  ? PT LONG TERM GOAL #3  ? Title Will demonstrate functionally correct lifting and general biomechanics without increase in pain to help prevent recurrence of symptoms   ? Status On-going   ?  ? PT LONG TERM GOAL #4  ? Title FOTO score to have improved by at least 10 points to show general improvement in mobility and function   ? Status Partially Met   ? ?  ?  ? ?  ? ? ? ? ? ? ? ? Plan - 07/28/21 1429   ? ? Clinical Impression Statement Pt ~ 6 minutes late for today's session. Again pt has progressed towards LTG's. Good carryover on elliptical completing without issues. Pt has forward head and rounded shoulders, postural cues needed with standing shoulder extension. increase resistance tolerated on leg press. Pt continues to report a constant  4-5/10 pain in the low back.   ? Personal Factors and Comorbidities Behavior Pattern;Past/Current Experience;Time since onset of injury/illness/exacerbation   ? Examination-Activity Limitations Locomotion Level;Transfers;Bed Mobility;Sit;Sleep;Carry;Squat;Stairs;Stand;Lift   ? Examination-Participation Restrictions Cleaning;Community Activity;Driving;Shop;Yard Work   ? Stability/Clinical Decision Making Stable/Uncomplicated   ? Rehab Potential Good   ? PT Frequency 1x / week   ? PT Duration 6 weeks   ? PT Treatment/Interventions ADLs/Self Care Home Management;Cryotherapy;Electrical Stimulation;Iontophoresis 4mg /ml Dexamethasone;Moist Heat;Ultrasound;Gait training;Stair training;Functional mobility training;Therapeutic activities;Therapeutic exercise;Balance training;Neuromuscular re-education;Patient/family education;Manual techniques;Passive range of motion;Dry needling;Energy conservation;Taping;Visual/perceptual remediation/compensation   ? PT Next Visit Plan continue focus on lumbar mobility and core strength, BM for lifting   ? ?  ?  ? ?  ? ? ?Patient will benefit from skilled therapeutic intervention in order to improve the following deficits and impairments:  Decreased range of motion, Increased fascial restricitons, Difficulty walking, Increased muscle spasms, Decreased activity tolerance, Pain, Hypomobility, Impaired flexibility, Improper body mechanics, Decreased strength, Postural dysfunction ? ?Visit Diagnosis: ?Muscle spasm of back ? ?Muscle weakness (generalized) ? ?Pain in left leg ? ?Chronic midline low back pain without sciatica ? ? ? ? ?Problem List ?Patient Active Problem List  ? Diagnosis Date Noted  ? Yeast infection 07/03/2021  ? Shortness of breath 06/05/2021  ? Breast pain 06/05/2021  ? Atypical chest pain 06/05/2021  ? History of COVID-19 06/05/2021  ? [name] 04/07/2021  ? Complex renal cyst 04/07/2021  ? Chronic right-sided low back pain without  sciatica 03/31/2020  ? Varicose veins of both lower extremities with pain 02/02/2019  ? Encounter for therapeutic drug level monitoring 09/19/2018  ? Type 2 diabetes mellitus with diabetic polyneuropathy, without long-term current use of insulin ([place]) 03/29/2018  ? Diabetic neuropathy ([place]) 03/29/2018  ? Chronic pain of left knee 03/23/2018  ? CAD (coronary artery disease) 08/03/2017  ? Post herpetic neuralgia 07/29/2016  ? Dizziness 01/02/2016  ? Chronic midline low back pain without sciatica 06/06/2015  ?  GERD (gastroesophageal reflux disease) 11/15/2014  ? Seizure disorder ([place]) 05/07/2014  ? Allergic rhinitis 05/07/2014  ? Routine general medical examination at a health care facility 05/07/2014  ? ? ?Scot Jun, PTA ?07/28/2021, 2:35 PM ? ?[facility] ?[address] ?[address] ?[place], [place], [address] ?Phone: [phone]   Fax:  [phone] ? ?Name: Keaden Gunnoe ?MRN: [id number] ?Date of Birth: 19-Aug-1945 ? ? ? ?

## 2021-07-30 ENCOUNTER — Ambulatory Visit: Payer: PPO | Admitting: Physical Therapy

## 2021-08-04 ENCOUNTER — Encounter: Payer: Self-pay | Admitting: Physical Therapy

## 2021-08-04 ENCOUNTER — Other Ambulatory Visit: Payer: Self-pay

## 2021-08-04 ENCOUNTER — Ambulatory Visit: Payer: PPO | Admitting: Physical Therapy

## 2021-08-04 DIAGNOSIS — M62830 Muscle spasm of back: Secondary | ICD-10-CM | POA: Diagnosis not present

## 2021-08-04 DIAGNOSIS — G8929 Other chronic pain: Secondary | ICD-10-CM

## 2021-08-04 DIAGNOSIS — M79605 Pain in left leg: Secondary | ICD-10-CM

## 2021-08-04 DIAGNOSIS — M545 Low back pain, unspecified: Secondary | ICD-10-CM

## 2021-08-04 DIAGNOSIS — M6281 Muscle weakness (generalized): Secondary | ICD-10-CM

## 2021-08-04 NOTE — Therapy (Signed)
[facility] ?[place] [address] ?[address] ?[place], [place], [address] ?Phone: [phone]   Fax:  [phone] ? ?Physical Therapy Treatment ? ?Patient Details  ?Name: Nicholas Rasmussen ?MRN: [id number] ?Date of Birth: 1945/11/19 ?Referring Provider (PT): Nche ? ? ?Encounter Date: 08/04/2021 ? ? PT End of Session - 08/04/21 1422   ? ? Visit Number 11   ? Date for PT Re-Evaluation 08/04/21   ? Authorization Type [employer]   ? PT Start Time 1345   ? PT Stop Time 1426   ? PT Time Calculation (min) 41 min   ? Activity Tolerance Patient tolerated treatment well   ? Behavior During Therapy [facility] for tasks assessed/performed   ? ?  ?  ? ?  ? ? ?Past Medical History:  ?Diagnosis Date  ? Allergic rhinitis 05/07/2014  ? Dizziness 01/02/2016  ? GERD (gastroesophageal reflux disease)   ? Headache 03/13/2015  ? Left flank pain 01/02/2016  ? Low back pain 06/06/2015  ? Pain of left side of body 08/13/2015  ? Post herpetic neuralgia   ? Routine general medical examination at a health care facility 05/07/2014  ? Seizure disorder ([place]) 05/07/2014  ? Seizures ([place])   ? Last seizure in the 70s.  ? Shingles   ? Stye 05/07/2014  ? ? ?Past Surgical History:  ?Procedure Laterality Date  ? COLONOSCOPY    ? KNEE SURGERY Left   ? left miniscus  ? UPPER GASTROINTESTINAL ENDOSCOPY    ? uvala surgery    ? ? ?There were no vitals filed for this visit. ? ? Subjective Assessment - 08/04/21 1347   ? ? Subjective "Ok"   ? Currently in Pain? Yes   ? Pain Score 4    ? Pain Location Back   ? ?  ?  ? ?  ? ? ? ? ? ? ? ? ? ? ? ? ? ? ? ? ? ? ? ? [facility] Adult PT Treatment/Exercise - 08/04/21 0001   ? ?  ? Lumbar Exercises: Stretches  ? Active Hamstring Stretch Right;Left;3 reps;20 seconds   ? Single Knee to Chest Stretch Left;Right;3 reps;10 seconds   ?  ? Lumbar Exercises: Aerobic  ? Nustep L 5 6 min   ?  ? Lumbar Exercises: Machines for Strengthening  ? Cybex Knee Extension 15lb 2x12   ? Cybex Knee Flexion 45lb 2x12   ?  Other Lumbar Machine Exercise Shoulder Ext 15lb 2x10   ?  ? Lumbar Exercises: Standing  ? Row Strengthening;Power tower;20 reps;Both   ? Row Limitations 20   ? Other Standing Lumbar Exercises Overhead Ext yellow 2x10   ?  ? Lumbar Exercises: Supine  ? Bridge Compliant;15 reps;2 seconds   ? Other Supine Lumbar Exercises feet on ball bridge,KTC and obl   ?  ? Modalities  ? Modalities Moist Heat   ?  ? Moist Heat Therapy  ? Number Minutes Moist Heat 11 Minutes   ? Moist Heat Location Lumbar Spine   ? ?  ?  ? ?  ? ? ? ? ? ? ? ? ? ? ? ? PT Short Term Goals - 07/02/21 1230   ? ?  ? PT SHORT TERM GOAL #1  ? Title Will be independent with appropriate HEP to be progressed PRN   ? Status Partially Met   ?  ? PT SHORT TERM GOAL #2  ? Title Pain to be no more than 2/10 in order to improve QOL  and sleep   ? Status On-going   ? ?  ?  ? ?  ? ? ? ? PT Long Term Goals - 07/28/21 1426   ? ?  ? PT LONG TERM GOAL #1  ? Title MMT to have improved by at least 1 grade in all weak groups to help reduce pain   ? Status Partially Met   ?  ? PT LONG TERM GOAL #2  ? Title Will be able to tolerate driving for at least 2 hours before needing to take a break without increase in pain so that he can travel to see family more easily   ? Status Partially Met   ?  ? PT LONG TERM GOAL #3  ? Title Will demonstrate functionally correct lifting and general biomechanics without increase in pain to help prevent recurrence of symptoms   ? Status On-going   ?  ? PT LONG TERM GOAL #4  ? Title FOTO score to have improved by at least 10 points to show general improvement in mobility and function   ? Status Partially Met   ? ?  ?  ? ?  ? ? ? ? ? ? ? ? Plan - 08/04/21 1423   ? ? Clinical Impression Statement Pt enters clinic reporting a long and active weekend. Initial pain rating was 4-5/10 that decreased after session. MHP was applies to low back during all stretching and supine interventions. Cue for full ROM needed with seated leg curls and extensions. Some  postural fatigue noted with the second sets of extensions.   ? Personal Factors and Comorbidities Behavior Pattern;Past/Current Experience;Time since onset of injury/illness/exacerbation   ? Examination-Activity Limitations Locomotion Level;Transfers;Bed Mobility;Sit;Sleep;Carry;Squat;Stairs;Stand;Lift   ? Examination-Participation Restrictions Cleaning;Community Activity;Driving;Shop;Yard Work   ? Stability/Clinical Decision Making Stable/Uncomplicated   ? Rehab Potential Good   ? PT Frequency 1x / week   ? PT Duration 6 weeks   ? PT Treatment/Interventions ADLs/Self Care Home Management;Cryotherapy;Electrical Stimulation;Iontophoresis 4mg /ml Dexamethasone;Moist Heat;Ultrasound;Gait training;Stair training;Functional mobility training;Therapeutic activities;Therapeutic exercise;Balance training;Neuromuscular re-education;Patient/family education;Manual techniques;Passive range of motion;Dry needling;Energy conservation;Taping;Visual/perceptual remediation/compensation   ? PT Next Visit Plan continue focus on lumbar mobility and core strength, BM for lifting   ? ?  ?  ? ?  ? ? ?Patient will benefit from skilled therapeutic intervention in order to improve the following deficits and impairments:  Decreased range of motion, Increased fascial restricitons, Difficulty walking, Increased muscle spasms, Decreased activity tolerance, Pain, Hypomobility, Impaired flexibility, Improper body mechanics, Decreased strength, Postural dysfunction ? ?Visit Diagnosis: ?Muscle spasm of back ? ?Muscle weakness (generalized) ? ?Pain in left leg ? ?Chronic midline low back pain without sciatica ? ? ? ? ?Problem List ?Patient Active Problem List  ? Diagnosis Date Noted  ? Yeast infection 07/03/2021  ? Shortness of breath 06/05/2021  ? Breast pain 06/05/2021  ? Atypical chest pain 06/05/2021  ? History of COVID-19 06/05/2021  ? [name] 04/07/2021  ? Complex renal cyst 04/07/2021  ? Chronic  right-sided low back pain without sciatica 03/31/2020  ? Varicose veins of both lower extremities with pain 02/02/2019  ? Encounter for therapeutic drug level monitoring 09/19/2018  ? Type 2 diabetes mellitus with diabetic polyneuropathy, without long-term current use of insulin ([place]) 03/29/2018  ? Diabetic neuropathy ([place]) 03/29/2018  ? Chronic pain of left knee 03/23/2018  ? CAD (coronary artery disease) 08/03/2017  ? Post herpetic neuralgia 07/29/2016  ? Dizziness 01/02/2016  ? Chronic midline low back pain without sciatica  06/06/2015  ? GERD (gastroesophageal reflux disease) 11/15/2014  ? Seizure disorder ([place]) 05/07/2014  ? Allergic rhinitis 05/07/2014  ? Routine general medical examination at a health care facility 05/07/2014  ? ? ?Scot Jun, PTA ?08/04/2021, 2:27 PM ? ?[facility] ?[address] ?[place], [place], [address] ?Phone: [phone]   Fax:  [phone] ? ?Name: Nicholas Rasmussen ?MRN: [id number] ?Date of Birth: May 19, 1946 ? ? ? ?

## 2021-08-06 ENCOUNTER — Other Ambulatory Visit: Payer: Self-pay

## 2021-08-06 ENCOUNTER — Ambulatory Visit: Payer: PPO | Admitting: Physical Therapy

## 2021-08-06 ENCOUNTER — Encounter: Payer: Self-pay | Admitting: Physical Therapy

## 2021-08-06 DIAGNOSIS — M62830 Muscle spasm of back: Secondary | ICD-10-CM | POA: Diagnosis not present

## 2021-08-06 DIAGNOSIS — M79605 Pain in left leg: Secondary | ICD-10-CM

## 2021-08-06 DIAGNOSIS — M6281 Muscle weakness (generalized): Secondary | ICD-10-CM

## 2021-08-06 NOTE — Therapy (Signed)
[facility] ?[place] ?[address] ?[place], [place], [address] ?Phone: [phone]   Fax:  [phone] ? ?Physical Therapy Treatment ? ?Patient Details  ?Name: Nicholas Rasmussen ?MRN: [id number] ?Date of Birth: [birth year] ?Referring Provider (PT): Nche ? ? ?Encounter Date: 08/06/2021 ? ? PT End of Session - 08/06/21 1426   ? ? Visit Number 12   ? Date for PT Re-Evaluation 08/04/21   ? Authorization Type [employer]   ? PT Start Time 1345   ? PT Stop Time 1426   ? PT Time Calculation (min) 41 min   ? Activity Tolerance Patient tolerated treatment well   ? Behavior During Therapy [facility] for tasks assessed/performed   ? ?  ?  ? ?  ? ? ?Past Medical History:  ?Diagnosis Date  ? Allergic rhinitis 05/07/2014  ? Dizziness 01/02/2016  ? GERD (gastroesophageal reflux disease)   ? Headache 03/13/2015  ? Left flank pain 01/02/2016  ? Low back pain 06/06/2015  ? Pain of left side of body 08/13/2015  ? Post herpetic neuralgia   ? Routine general medical examination at a health care facility 05/07/2014  ? Seizure disorder ([place]) 05/07/2014  ? Seizures ([place])   ? Last seizure in the 70s.  ? Shingles   ? Stye 05/07/2014  ? ? ?Past Surgical History:  ?Procedure Laterality Date  ? COLONOSCOPY    ? KNEE SURGERY Left   ? left miniscus  ? UPPER GASTROINTESTINAL ENDOSCOPY    ? uvala surgery    ? ? ?There were no vitals filed for this visit. ? ? Subjective Assessment - 08/06/21 1348   ? ? Subjective "Pretty good"   ? Currently in Pain? Yes   ? Pain Score 4    ? Pain Location Back   ? ?  ?  ? ?  ? ? ? ? ? ? ? ? ? ? ? ? ? ? ? ? ? ? ? ? [facility] Adult PT Treatment/Exercise - 08/06/21 0001   ? ?  ? Lumbar Exercises: Stretches  ? Active Hamstring Stretch Right;Left;3 reps;20 seconds   ? Single Knee to Chest Stretch Left;Right;3 reps;10 seconds   ? Double Knee to Chest Stretch 3 reps;10 seconds   ?  ? Lumbar Exercises: Aerobic  ? Elliptical L4 x6  min   ?  ? Lumbar Exercises: Machines for Strengthening  ? Leg  Press 80lb 2x15   ? Other Lumbar Machine Exercise lat pull down and rows 2 sets 12 45#   ? Other Lumbar Machine Exercise Shoulder Ext 15lb 2x12   ?  ? Lumbar Exercises: Standing  ? Other Standing Lumbar Exercises Overhead Ext yellow 2x10   ?  ? Knee/Hip Exercises: Standing  ? Hip Extension Both;2 sets;Stengthening;10 reps;Knee straight   ? Extension Limitations 15lb   ? Walking with Sports Cord 40lb side step x5 each   ?  ? Modalities  ? Modalities Moist Heat   ?  ? Moist Heat Therapy  ? Number Minutes Moist Heat 10 Minutes   ? Moist Heat Location Lumbar Spine   ? ?  ?  ? ?  ? ? ? ? ? ? ? ? ? ? ? ? PT Short Term Goals - 07/02/21 1230   ? ?  ? PT SHORT TERM GOAL #1  ? Title Will be independent with appropriate HEP to be progressed PRN   ? Status Partially Met   ?  ? PT SHORT TERM GOAL #2  ?  Title Pain to be no more than 2/10 in order to improve QOL and sleep   ? Status On-going   ? ?  ?  ? ?  ? ? ? ? PT Long Term Goals - 07/28/21 1426   ? ?  ? PT LONG TERM GOAL #1  ? Title MMT to have improved by at least 1 grade in all weak groups to help reduce pain   ? Status Partially Met   ?  ? PT LONG TERM GOAL #2  ? Title Will be able to tolerate driving for at least 2 hours before needing to take a break without increase in pain so that he can travel to see family more easily   ? Status Partially Met   ?  ? PT LONG TERM GOAL #3  ? Title Will demonstrate functionally correct lifting and general biomechanics without increase in pain to help prevent recurrence of symptoms   ? Status On-going   ?  ? PT LONG TERM GOAL #4  ? Title FOTO score to have improved by at least 10 points to show general improvement in mobility and function   ? Status Partially Met   ? ?  ?  ? ?  ? ? ? ? ? ? ? ? Plan - 08/06/21 1426   ? ? Clinical Impression Statement Pt is doing well hans is progressing towards LTG's. Added additional hip and postural strengthening without issues. Cue's needed to keep hips square with resisted side steps. Cues to increase  lumbar ext with overhead extensions. No reports of increase pain throughout session. MHP applied to lumbar spine during stretching and other supine interventions   ? Personal Factors and Comorbidities Behavior Pattern;Past/Current Experience;Time since onset of injury/illness/exacerbation   ? Examination-Activity Limitations Locomotion Level;Transfers;Bed Mobility;Sit;Sleep;Carry;Squat;Stairs;Stand;Lift   ? Examination-Participation Restrictions Cleaning;Community Activity;Driving;Shop;Yard Work   ? Stability/Clinical Decision Making Stable/Uncomplicated   ? Rehab Potential Good   ? PT Frequency 1x / week   ? PT Treatment/Interventions ADLs/Self Care Home Management;Cryotherapy;Electrical Stimulation;Iontophoresis 3m/ml Dexamethasone;Moist Heat;Ultrasound;Gait training;Stair training;Functional mobility training;Therapeutic activities;Therapeutic exercise;Balance training;Neuromuscular re-education;Patient/family education;Manual techniques;Passive range of motion;Dry needling;Energy conservation;Taping;Visual/perceptual remediation/compensation   ? PT Next Visit Plan continue focus on lumbar mobility and core strength, BM for lifting   ? ?  ?  ? ?  ? ? ?Patient will benefit from skilled therapeutic intervention in order to improve the following deficits and impairments:  Decreased range of motion, Increased fascial restricitons, Difficulty walking, Increased muscle spasms, Decreased activity tolerance, Pain, Hypomobility, Impaired flexibility, Improper body mechanics, Decreased strength, Postural dysfunction ? ?Visit Diagnosis: ?Muscle spasm of back ? ?Pain in left leg ? ?Muscle weakness (generalized) ? ? ? ? ?Problem List ?Patient Active Problem List  ? Diagnosis Date Noted  ? Yeast infection 07/03/2021  ? Shortness of breath 06/05/2021  ? Breast pain 06/05/2021  ? Atypical chest pain 06/05/2021  ? History of COVID-19 06/05/2021  ? [name] 04/07/2021  ? Complex renal cyst  04/07/2021  ? Chronic right-sided low back pain without sciatica 03/31/2020  ? Varicose veins of both lower extremities with pain 02/02/2019  ? Encounter for therapeutic drug level monitoring 09/19/2018  ? Type 2 diabetes mellitus with diabetic polyneuropathy, without long-term current use of insulin ([place] 03/29/2018  ? Diabetic neuropathy ([place] 03/29/2018  ? Chronic pain of left knee 03/23/2018  ? CAD (coronary artery disease) 08/03/2017  ? Post herpetic neuralgia 07/29/2016  ? Dizziness 01/02/2016  ? Chronic midline low back pain without sciatica  06/06/2015  ? GERD (gastroesophageal reflux disease) 11/15/2014  ? Seizure disorder ([place]) 05/07/2014  ? Allergic rhinitis 05/07/2014  ? Routine general medical examination at a health care facility 05/07/2014  ? ? ?Scot Jun, PTA ?08/06/2021, 2:30 PM ? ?[facility] ?[address] ?[address] ?[place], [place], [address] ?Phone: [phone]   Fax:  [phone] ? ?Name: Nicholas Rasmussen ?MRN: [id number] ?Date of Birth: [birth year] ? ? ? ?

## 2021-08-11 ENCOUNTER — Ambulatory Visit: Payer: PPO | Admitting: Physical Therapy

## 2021-08-11 ENCOUNTER — Other Ambulatory Visit: Payer: Self-pay

## 2021-08-11 ENCOUNTER — Encounter: Payer: Self-pay | Admitting: Physical Therapy

## 2021-08-11 DIAGNOSIS — M62830 Muscle spasm of back: Secondary | ICD-10-CM

## 2021-08-11 DIAGNOSIS — M6281 Muscle weakness (generalized): Secondary | ICD-10-CM

## 2021-08-11 DIAGNOSIS — M79605 Pain in left leg: Secondary | ICD-10-CM

## 2021-08-11 NOTE — Therapy (Signed)
[facility] ?[place] [address] ?[place], [place], [address] ?Phone: [phone]   Fax:  [phone] ? ?Physical Therapy Treatment ? ?Patient Details  ?Name: Nicholas Rasmussen ?MRN: [id number] ?Date of Birth: 1945/11/17 ?Referring Provider (PT): Nche ? ? ?Encounter Date: 08/11/2021 ? ? PT End of Session - 08/11/21 1508   ? ? Visit Number 13   ? Date for PT Re-Evaluation 08/04/21   ? Authorization Type [employer]   ? PT Start Time 1425   ? PT Stop Time 6720   ? PT Time Calculation (min) 43 min   ? Activity Tolerance Patient tolerated treatment well   ? Behavior During Therapy [facility] for tasks assessed/performed   ? ?  ?  ? ?  ? ? ?Past Medical History:  ?Diagnosis Date  ? Allergic rhinitis 05/07/2014  ? Dizziness 01/02/2016  ? GERD (gastroesophageal reflux disease)   ? Headache 03/13/2015  ? Left flank pain 01/02/2016  ? Low back pain 06/06/2015  ? Pain of left side of body 08/13/2015  ? Post herpetic neuralgia   ? Routine general medical examination at a health care facility 05/07/2014  ? Seizure disorder ([place]) 05/07/2014  ? Seizures ([place])   ? Last seizure in the 70s.  ? Shingles   ? Stye 05/07/2014  ? ? ?Past Surgical History:  ?Procedure Laterality Date  ? COLONOSCOPY    ? KNEE SURGERY Left   ? left miniscus  ? UPPER GASTROINTESTINAL ENDOSCOPY    ? uvala surgery    ? ? ?There were no vitals filed for this visit. ? ? Subjective Assessment - 08/11/21 1425   ? ? Subjective "Ok I reckon"   ? Pain Score 4    ? Pain Location Back   ? Pain Orientation Left   ? ?  ?  ? ?  ? ? ? ? ? ? ? ? ? ? ? ? ? ? ? ? ? ? ? ? [place] Adult PT Treatment/Exercise - 08/11/21 0001   ? ?  ? Lumbar Exercises: Stretches  ? Single Knee to Chest Stretch Left;Right;3 reps;10 seconds   ? Double Knee to Chest Stretch 3 reps;10 seconds   ?  ? Lumbar Exercises: Aerobic  ? Elliptical L4 x6  min   ?  ? Lumbar Exercises: Machines for Strengthening  ? Leg Press 100lb 2x15   ? Other Lumbar Machine Exercise lat  pull down and rows 2 sets 12 45#   ?  ? Lumbar Exercises: Standing  ? Row Strengthening;Power tower;20 reps;Both   ? Row Limitations 20   ? Shoulder Extension Strengthening;Both;20 reps   ? Shoulder Extension Limitations 15   ?  ? Lumbar Exercises: Seated  ? Other Seated Lumbar Exercises Shoulder Er  green 2x10   ?  ? Lumbar Exercises: Supine  ? Other Supine Lumbar Exercises feet on ball bridge,KTC and obl   ?  ? Knee/Hip Exercises: Standing  ? Hip Extension Both;2 sets;Stengthening;10 reps;Knee straight   ? Extension Limitations 15lb   ? ?  ?  ? ?  ? ? ? ? ? ? ? ? ? ? ? ? PT Short Term Goals - 07/02/21 1230   ? ?  ? PT SHORT TERM GOAL #1  ? Title Will be independent with appropriate HEP to be progressed PRN   ? Status Partially Met   ?  ? PT SHORT TERM GOAL #2  ? Title Pain to be no more than 2/10 in order to improve  QOL and sleep   ? Status On-going   ? ?  ?  ? ?  ? ? ? ? PT Long Term Goals - 08/11/21 1438   ? ?  ? PT LONG TERM GOAL #1  ? Title MMT to have improved by at least 1 grade in all weak groups to help reduce pain   ? Status Partially Met   ?  ? PT LONG TERM GOAL #2  ? Title Will be able to tolerate driving for at least 2 hours before needing to take a break without increase in pain so that he can travel to see family more easily   ? Status Achieved   ?  ? PT LONG TERM GOAL #3  ? Title Will demonstrate functionally correct lifting and general biomechanics without increase in pain to help prevent recurrence of symptoms   ? Status Partially Met   ? ?  ?  ? ?  ? ? ? ? ? ? ? ? Plan - 08/11/21 1509   ? ? Clinical Impression Statement Pt had progressed meeting some LTG's. He did well completing all interventions. Increase resistance tolerated on leg press without issues. Cues for core engagement needed with shoulder extensions. Cue for trunk stability needed with supine interventions LE on [place].   ? Personal Factors and Comorbidities Behavior Pattern;Past/Current Experience;Time since onset of  injury/illness/exacerbation   ? Examination-Activity Limitations Locomotion Level;Transfers;Bed Mobility;Sit;Sleep;Carry;Squat;Stairs;Stand;Lift   ? Examination-Participation Restrictions Cleaning;Community Activity;Driving;Shop;Yard Work   ? Rehab Potential Good   ? PT Frequency 1x / week   ? PT Duration 6 weeks   ? PT Treatment/Interventions ADLs/Self Care Home Management;Cryotherapy;Electrical Stimulation;Iontophoresis 4mg /ml Dexamethasone;Moist Heat;Ultrasound;Gait training;Stair training;Functional mobility training;Therapeutic activities;Therapeutic exercise;Balance training;Neuromuscular re-education;Patient/family education;Manual techniques;Passive range of motion;Dry needling;Energy conservation;Taping;Visual/perceptual remediation/compensation   ? ?  ?  ? ?  ? ? ?Patient will benefit from skilled therapeutic intervention in order to improve the following deficits and impairments:  Decreased range of motion, Increased fascial restricitons, Difficulty walking, Increased muscle spasms, Decreased activity tolerance, Pain, Hypomobility, Impaired flexibility, Improper body mechanics, Decreased strength, Postural dysfunction ? ?Visit Diagnosis: ?Muscle spasm of back ? ?Pain in left leg ? ?Muscle weakness (generalized) ? ? ? ? ?Problem List ?Patient Active Problem List  ? Diagnosis Date Noted  ? Yeast infection 07/03/2021  ? Shortness of breath 06/05/2021  ? Breast pain 06/05/2021  ? Atypical chest pain 06/05/2021  ? History of COVID-19 06/05/2021  ? [name] 04/07/2021  ? Complex renal cyst 04/07/2021  ? Chronic right-sided low back pain without sciatica 03/31/2020  ? Varicose veins of both lower extremities with pain 02/02/2019  ? Encounter for therapeutic drug level monitoring 09/19/2018  ? Type 2 diabetes mellitus with diabetic polyneuropathy, without long-term current use of insulin (H[facility]) 03/29/2018  ? Diabetic neuropathy ([facility]) 03/29/2018  ? Chronic pain of left knee  03/23/2018  ? CAD (coronary artery disease) 08/03/2017  ? Post herpetic neuralgia 07/29/2016  ? Dizziness 01/02/2016  ? Chronic midline low back pain without sciatica 06/06/2015  ? GERD (gastroesophageal reflux disease) 11/15/2014  ? Seizure disorder ([facility]) 05/07/2014  ? Allergic rhinitis 05/07/2014  ? Routine general medical examination at a health care facility 05/07/2014  ? ? ?05/09/2014, PTA ?08/11/2021, 3:11 PM ? ?[place] [facility] ?08/13/2021 [address] ?[place], [place], [place] ?Phone: [phone]   Fax:  [phone] ? ?Name: Nicholas Rasmussen ?MRN: [name] ?Date of Birth: 1945/11/03 ? ? ? ?

## 2021-08-13 ENCOUNTER — Ambulatory Visit: Payer: PPO | Admitting: Physical Therapy

## 2021-08-13 ENCOUNTER — Encounter: Payer: Self-pay | Admitting: Physical Therapy

## 2021-08-13 ENCOUNTER — Other Ambulatory Visit: Payer: Self-pay

## 2021-08-13 DIAGNOSIS — M62830 Muscle spasm of back: Secondary | ICD-10-CM | POA: Diagnosis not present

## 2021-08-13 DIAGNOSIS — M545 Low back pain, unspecified: Secondary | ICD-10-CM

## 2021-08-13 DIAGNOSIS — M79605 Pain in left leg: Secondary | ICD-10-CM

## 2021-08-13 DIAGNOSIS — M6281 Muscle weakness (generalized): Secondary | ICD-10-CM

## 2021-08-13 NOTE — Therapy (Addendum)
[facility] ?[place] ?[address] ?[place], [place], [address] ?Phone: [phone]   Fax:  [phone] ? ?Physical Therapy Treatment ? ?Patient Details  ?Name: Nicholas Rasmussen ?MRN: [id number] ?Date of Birth: 1945/10/12 ?Referring Provider (PT): Nche ? ? ?Encounter Date: 08/13/2021 ? ? PT End of Session - 08/13/21 1509   ? ? Visit Number 14   ? Date for PT Re-Evaluation 09/06/21   ? Authorization Type [employer]   ? Authorization Time Period --   ? PT Start Time 1430   ? PT Stop Time 1515   ? PT Time Calculation (min) 45 min   ? Activity Tolerance Patient tolerated treatment well   ? Behavior During Therapy [facility] for tasks assessed/performed   ? ?  ?  ? ?  ? ? ?Past Medical History:  ?Diagnosis Date  ? Allergic rhinitis 05/07/2014  ? Dizziness 01/02/2016  ? GERD (gastroesophageal reflux disease)   ? Headache 03/13/2015  ? Left flank pain 01/02/2016  ? Low back pain 06/06/2015  ? Pain of left side of body 08/13/2015  ? Post herpetic neuralgia   ? Routine general medical examination at a health care facility 05/07/2014  ? Seizure disorder ([place]) 05/07/2014  ? Seizures ([place])   ? Last seizure in the 70s.  ? Shingles   ? Stye 05/07/2014  ? ? ?Past Surgical History:  ?Procedure Laterality Date  ? COLONOSCOPY    ? KNEE SURGERY Left   ? left miniscus  ? UPPER GASTROINTESTINAL ENDOSCOPY    ? uvala surgery    ? ? ?There were no vitals filed for this visit. ? ? Subjective Assessment - 08/13/21 1430   ? ? Subjective "Pretty good, my constant 4 I guess"   ? Currently in Pain? Yes   ? Pain Score 4    ? Pain Location Back   ? ?  ?  ? ?  ? ? ? ? ? [facility] PT Assessment - 08/13/21 0001   ? ?  ? Assessment  ? Medical Diagnosis back pain   ? Referring Provider (PT) Nche   ?  ? Strength  ? Left Hip Extension 4+/5   ? Right Knee Flexion 5/5   ? Right Knee Extension 5/5   ? Left Knee Flexion 5/5   ? Left Knee Extension 5/5   ? ?  ?  ? ?  ? ? ? ? ? ? ? ? ? ? ? ? ? ? ? ? [facility] Adult PT  Treatment/Exercise - 08/13/21 0001   ? ?  ? Ambulation/Gait  ? Gait Comments One lap around back building around outter most area of parking lot   ?  ? Lumbar Exercises: Stretches  ? Single Knee to Chest Stretch Left;Right;3 reps;10 seconds;20 seconds   ?  ? Lumbar Exercises: Aerobic  ? Elliptical L5 x6  min   ?  ? Lumbar Exercises: Machines for Strengthening  ? Cybex Lumbar Extension black tband 2 sets 10   ? Cybex Knee Extension 20lb 2x12   ? Cybex Knee Flexion 55lb 2x12   ? Other Lumbar Machine Exercise lat pull down and rows 2 sets 12 45#   ?  ? Lumbar Exercises: Standing  ? Other Standing Lumbar Exercises 20lb farmers carry   ?  ? Lumbar Exercises: Seated  ? Sit to Stand 10 reps   x2 OHP with yellw ball  ?  ? Lumbar Exercises: Supine  ? Bridge Compliant;15 reps;2 seconds   ?  ?  Knee/Hip Exercises: Standing  ? Hip Extension Both;2 sets;Stengthening;10 reps;Knee straight   ? Extension Limitations 15lb   ? ?  ?  ? ?  ? ? ? ? ? ? ? ? ? ? ? ? PT Short Term Goals - 08/13/21 1510   ? ?  ? PT SHORT TERM GOAL #1  ? Title Will be independent with appropriate HEP to be progressed PRN   ? Status Achieved   ?  ? PT SHORT TERM GOAL #2  ? Title Pain to be no more than 2/10 in order to improve QOL and sleep   ? Status Partially Met   ?  ? PT SHORT TERM GOAL #3  ? Title Will be able to maintain upright posture with good core activation at least 75% of the time to help in reducing low back and thoracic/cervical pain   ? Status Achieved   ?  ? PT SHORT TERM GOAL #4  ? Title Lumbar ROM to be no more than 20% limited and  hamstring/piriformis/hip flexor flexibility to have improved by 50% to improve general mobility   ? Status Achieved   ? ?  ?  ? ?  ? ? ? ? PT Long Term Goals - 08/13/21 1511   ? ?  ? PT LONG TERM GOAL #1  ? Title MMT to have improved by at least 1 grade in all weak groups to help reduce pain   ? Status Partially Met   ?  ? PT LONG TERM GOAL #2  ? Title Will be able to tolerate driving for at least 2 hours before  needing to take a break without increase in pain so that he can travel to see family more easily   ? Status Achieved   ?  ? PT LONG TERM GOAL #3  ? Title Will demonstrate functionally correct lifting and general biomechanics without increase in pain to help prevent recurrence of symptoms   ? Status Partially Met   ? ?  ?  ? ?  ? ? ? ? ? ? ? ? Plan - 08/13/21 1512   ? ? Clinical Impression Statement Pt did well progressing with outdoor ambulation maintaining good posture; after receiving cues. No issue completing machine level interventions. Cue for eccentric control needed with seated hamstring curls. Increase fatigue with sit to stands.   ? Personal Factors and Comorbidities Behavior Pattern;Past/Current Experience;Time since onset of injury/illness/exacerbation   ? Examination-Activity Limitations Locomotion Level;Transfers;Bed Mobility;Sit;Sleep;Carry;Squat;Stairs;Stand;Lift   ? Examination-Participation Restrictions Cleaning;Community Activity;Driving;Shop;Yard Work   ? Rehab Potential Good   ? PT Frequency 1x / week   ? PT Treatment/Interventions ADLs/Self Care Home Management;Cryotherapy;Electrical Stimulation;Iontophoresis 4mg /ml Dexamethasone;Moist Heat;Ultrasound;Gait training;Stair training;Functional mobility training;Therapeutic activities;Therapeutic exercise;Balance training;Neuromuscular re-education;Patient/family education;Manual techniques;Passive range of motion;Dry needling;Energy conservation;Taping;Visual/perceptual remediation/compensation   ? PT Next Visit Plan continue focus on lumbar mobility and core strength, BM for lifting   ? ?  ?  ? ?  ? ? ?Patient will benefit from skilled therapeutic intervention in order to improve the following deficits and impairments:  Decreased range of motion, Increased fascial restricitons, Difficulty walking, Increased muscle spasms, Decreased activity tolerance, Pain, Hypomobility, Impaired flexibility, Improper body mechanics, Decreased strength, Postural  dysfunction ? ?Visit Diagnosis: ?Muscle spasm of back - Plan: PT plan of care cert/re-cert ? ?Pain in left leg - Plan: PT plan of care cert/re-cert ? ?Chronic midline low back pain without sciatica - Plan: PT plan of care cert/re-cert ? ?Muscle weakness (generalized) - Plan: PT plan of care cert/re-cert ? ? ? ? ?  Problem List ?Patient Active Problem List  ? Diagnosis Date Noted  ? Yeast infection 07/03/2021  ? Shortness of breath 06/05/2021  ? Breast pain 06/05/2021  ? Atypical chest pain 06/05/2021  ? History of COVID-19 06/05/2021  ? [name] 04/07/2021  ? Complex renal cyst 04/07/2021  ? Chronic right-sided low back pain without sciatica 03/31/2020  ? Varicose veins of both lower extremities with pain 02/02/2019  ? Encounter for therapeutic drug level monitoring 09/19/2018  ? Type 2 diabetes mellitus with diabetic polyneuropathy, without long-term current use of insulin ([place]) 03/29/2018  ? Diabetic neuropathy ([place]) 03/29/2018  ? Chronic pain of left knee 03/23/2018  ? CAD (coronary artery disease) 08/03/2017  ? Post herpetic neuralgia 07/29/2016  ? Dizziness 01/02/2016  ? Chronic midline low back pain without sciatica 06/06/2015  ? GERD (gastroesophageal reflux disease) 11/15/2014  ? Seizure disorder ([place]) 05/07/2014  ? Allergic rhinitis 05/07/2014  ? Routine general medical examination at a health care facility 05/07/2014  ? ? Sumner Boast, PT ?08/13/2021, 5:29 PM ? ?[facility] ?[place] ?[address] ?[place], [place], [address] ?Phone: [phone]   Fax:  [phone] ? ?Name: Caio Devera ?MRN: [id number] ?Date of Birth: [birth year] ? ? ? ?

## 2021-08-19 ENCOUNTER — Other Ambulatory Visit: Payer: Self-pay

## 2021-08-19 ENCOUNTER — Ambulatory Visit (INDEPENDENT_AMBULATORY_CARE_PROVIDER_SITE_OTHER): Payer: PPO | Admitting: Nurse Practitioner

## 2021-08-19 ENCOUNTER — Other Ambulatory Visit: Payer: Self-pay | Admitting: Nurse Practitioner

## 2021-08-19 ENCOUNTER — Encounter: Payer: Self-pay | Admitting: Nurse Practitioner

## 2021-08-19 ENCOUNTER — Other Ambulatory Visit: Payer: Self-pay | Admitting: Neurology

## 2021-08-19 VITALS — BP 118/60 | HR 62 | Temp 97.1°F | Ht 76.0 in | Wt 239.2 lb

## 2021-08-19 DIAGNOSIS — E1142 Type 2 diabetes mellitus with diabetic polyneuropathy: Secondary | ICD-10-CM

## 2021-08-19 DIAGNOSIS — K2100 Gastro-esophageal reflux disease with esophagitis, without bleeding: Secondary | ICD-10-CM

## 2021-08-19 DIAGNOSIS — I25119 Atherosclerotic heart disease of native coronary artery with unspecified angina pectoris: Secondary | ICD-10-CM | POA: Diagnosis not present

## 2021-08-19 DIAGNOSIS — E1169 Type 2 diabetes mellitus with other specified complication: Secondary | ICD-10-CM | POA: Diagnosis not present

## 2021-08-19 DIAGNOSIS — E785 Hyperlipidemia, unspecified: Secondary | ICD-10-CM

## 2021-08-19 DIAGNOSIS — B0229 Other postherpetic nervous system involvement: Secondary | ICD-10-CM

## 2021-08-19 MED ORDER — ATORVASTATIN CALCIUM 20 MG PO TABS: 20.0000 mg | ORAL_TABLET | Freq: Every day | ORAL | 3 refills | Status: DC

## 2021-08-19 MED ORDER — EMPAGLIFLOZIN 10 MG PO TABS: 10.0000 mg | ORAL_TABLET | Freq: Every day | ORAL | 1 refills | Status: DC

## 2021-08-19 MED ORDER — GABAPENTIN 300 MG PO CAPS: ORAL_CAPSULE | ORAL | 3 refills | Status: DC

## 2021-08-19 MED ORDER — METOPROLOL SUCCINATE ER 25 MG PO TB24: 25.0000 mg | ORAL_TABLET | Freq: Every day | ORAL | 3 refills | Status: DC

## 2021-08-19 NOTE — Progress Notes (Signed)
? ?             Established Patient Visit ? ?Patient: Nicholas Rasmussen   DOB: 1946-01-08   76 y.o. Male  MRN: [id number] ?Visit Date: 08/19/2021 ? ?Subjective:  ?  ?Chief Complaint  ?Patient presents with  ? Acute Visit  ?  Pt c/o stomach bloating and pain that comes and goes for the past several months. Pt states pain is not intense but it is noticeable.  ?Pt states he also noticed that he often wakes up with a headache over his right eye.   ?He also Needs medications refilled. ? ?HPI ?GERD (gastroesophageal reflux disease) ?Reports intermittent bloating and burning sensation in epigastric region ?Last EGD 2019: chronic gastritis and duodenitis ?Chronic, worse in last 58month ?Admits to not always avoiding trigger food items ?No change in BM or appetite, no weight loss, no nausea ? ?Increase omeprazole to '20mg'$  BID x 2weeks, then decrease to '20mg'$  daily ?Advised to avoid trigger foods ? ? ?Post herpetic neuralgia ?Chronic, onset after shingles outbreak 2018, intermittent, unilateral-R, mild, associated with right ear and temple fullness ?Resolves spontaneously, occasional use of tylenol OTC with significant improvement. ?No rash, no facial asymmetry, no change in vision, no hearing loss. ? ?Continue daily use of gabapentin and tylenol as needed. ? ?Reviewed medical, surgical, and social history today ? ?Medications: ?Outpatient Medications Prior to Visit  ?Medication Sig  ? acetaminophen (TYLENOL) 500 MG tablet Take 1 tablet (500 mg total) by mouth every 8 (eight) hours as needed.  ? acyclovir (ZOVIRAX) 200 MG capsule Take 1 capsule (200 mg total) by mouth 5 (five) times daily.  ? clotrimazole (LOTRIMIN) 1 % cream Apply 1 application topically 2 (two) times daily.  ? diclofenac Sodium (VOLTAREN) 1 % GEL Apply a small grape sized dollop to sore joint up to every 6 hours as needed  ? divalproex (DEPAKOTE ER) 500 MG 24 hr tablet TAKE 1 TABLET BY MOUTH AT BEDTIME (CALL [phone] APPT)  ? ibuprofen (ADVIL) 800 MG tablet  Take 1 tablet (800 mg total) by mouth 3 (three) times daily.  ? meclizine (ANTIVERT) 25 MG tablet Take 1 tablet (25 mg total) by mouth 3 (three) times daily as needed.  ? omeprazole (PRILOSEC) 20 MG capsule TAKE 1 CAPSULE BY MOUTH EVERY DAY AS NEEDED  ? [DISCONTINUED] atorvastatin (LIPITOR) 20 MG tablet Take 1 tablet (20 mg total) by mouth daily at 6 PM.  ? [DISCONTINUED] empagliflozin (JARDIANCE) 10 MG TABS tablet Take 1 tablet (10 mg total) by mouth daily.  ? [DISCONTINUED] gabapentin (NEURONTIN) 300 MG capsule TAKE 1 CAPSULE BY MOUTH EVERYDAY AT BEDTIME  ? [DISCONTINUED] metoprolol succinate (TOPROL-XL) 25 MG 24 hr tablet Take 1 tablet (25 mg total) by mouth daily. Please keep upcoming appointment in August 2022 for future refills. Thank you  ? ?No facility-administered medications prior to visit.  ? ?Reviewed past medical and social history.  ? ?ROS per HPI above ? ? ?   ?Objective:  ?BP 118/60 (BP Location: Left Arm, Patient Position: Sitting, Cuff Size: Large)   Pulse 62   Temp (!) 97.1 ?F (36.2 ?C) (Temporal)   Ht '6\' 4"'$  (1.93 m)   Wt 239 lb 3.2 oz (108.5 kg)   SpO2 98%   BMI 29.12 kg/m?  ? ?  ? ?Physical Exam ?HENT:  ?   Right Ear: External ear normal.  ?   Left Ear: External ear normal.  ?Eyes:  ?   General: No scleral icterus. ?  Extraocular Movements: Extraocular movements intact.  ?   Conjunctiva/sclera: Conjunctivae normal.  ?   Pupils: Pupils are equal, round, and reactive to light.  ?Cardiovascular:  ?   Rate and Rhythm: Normal rate and regular rhythm.  ?   Pulses: Normal pulses.  ?   Heart sounds: Normal heart sounds.  ?Pulmonary:  ?   Effort: Pulmonary effort is normal.  ?   Breath sounds: Normal breath sounds.  ?Abdominal:  ?   General: Bowel sounds are normal. There is no distension.  ?   Palpations: Abdomen is soft.  ?   Tenderness: There is no abdominal tenderness. There is no guarding.  ?Musculoskeletal:  ?   Cervical back: Normal range of motion and neck supple.  ?Neurological:  ?    Mental Status: He is alert and oriented to person, place, and time.  ?   Cranial Nerves: Cranial nerves 2-12 are intact.  ?  ?No results found for any visits on 08/19/21. ?   ?Assessment & Plan:  ?  ?Problem List Items Addressed This Visit   ? ?  ? Cardiovascular and Mediastinum  ? CAD (coronary artery disease)  ? Relevant Medications  ? atorvastatin (LIPITOR) 20 MG tablet  ? empagliflozin (JARDIANCE) 10 MG TABS tablet  ? metoprolol succinate (TOPROL-XL) 25 MG 24 hr tablet  ? Other Relevant Orders  ? AMB Referral to [facility]  ?  ? Digestive  ? GERD (gastroesophageal reflux disease) - Primary  ?  Reports intermittent bloating and burning sensation in epigastric region ?Last EGD 2019: chronic gastritis and duodenitis ?Chronic, worse in last 39month ?Admits to not always avoiding trigger food items ?No change in BM or appetite, no weight loss, no nausea ? ?Increase omeprazole to '20mg'$  BID x 2weeks, then decrease to '20mg'$  daily ?Advised to avoid trigger foods ? ?  ?  ?  ? Endocrine  ? Hyperlipidemia associated with type 2 diabetes mellitus ([facility]  ? Relevant Medications  ? atorvastatin (LIPITOR) 20 MG tablet  ? empagliflozin (JARDIANCE) 10 MG TABS tablet  ? metoprolol succinate (TOPROL-XL) 25 MG 24 hr tablet  ? Other Relevant Orders  ? AMB Referral to [facility] ? Type 2 diabetes mellitus with diabetic polyneuropathy, without long-term current use of insulin ([facility]  ? Relevant Medications  ? atorvastatin (LIPITOR) 20 MG tablet  ? empagliflozin (JARDIANCE) 10 MG TABS tablet  ? gabapentin (NEURONTIN) 300 MG capsule  ? Other Relevant Orders  ? AMB Referral to [place] ?  ? Nervous and Auditory  ? Post herpetic neuralgia  ?  Chronic, onset after shingles outbreak 2018, intermittent, unilateral-R, mild, associated with right ear and temple fullness ?Resolves spontaneously, occasional use of tylenol OTC with significant improvement. ?No rash, no facial asymmetry, no change in  vision, no hearing loss. ? ?Continue daily use of gabapentin and tylenol as needed. ?  ?  ? Relevant Medications  ? gabapentin (NEURONTIN) 300 MG capsule  ? ?Return if symptoms worsen or fail to improve. ? ?  ? ?CWilfred Lacy NP ? ? ?

## 2021-08-19 NOTE — Assessment & Plan Note (Signed)
Chronic, onset after shingles outbreak 2018, intermittent, unilateral-R, mild, associated with right ear and temple fullness ?Resolves spontaneously, occasional use of tylenol OTC with significant improvement. ?No rash, no facial asymmetry, no change in vision, no hearing loss. ? ?Continue daily use of gabapentin and tylenol as needed. ?

## 2021-08-19 NOTE — Patient Instructions (Signed)
Increase omeprazole to '20mg'$  BIX x 2weeks, then decrease to 1tab daily ?Maintain GERD diet. ?Keep headache log ? ?Cluster Headache ?Cluster headaches hurt a lot. They normally happen on one side of your head, but they may switch sides. Often, cluster headaches: ?Cause a lot of pain. ?Happen for weeks to months. ?Last from 15 minutes to 3 hours. ?Happen at the same time each day. ?Happen at night. ?Happen many times a day. ?Happen more often in the fall and springtime. ?What are the causes? ?The exact cause is not known. They are not usually caused by foods, changes in body chemicals (hormonal changes), or stress. ?What increases the risk? ?Being a male between the ages of 32-42 years old. ?Smoking or using products that contain nicotine or tobacco. ?Having elevated levels of body chemical called histamine. This can happen in people who have allergies. ?Taking certain medicines that cause blood vessels to expand. ?Having a parent or brother or sister who has cluster headaches. ?What are the signs or symptoms? ?Very bad pain on one side of the head that begins behind or around your eye but may spread to your face, head, and neck. ?Feeling like you may vomit (nauseous). ?Being sensitive to light. ?Runny nose and stuffy nose. ?Swelling of the forehead or face on the affected side. ?Eye problems. This might include a droopy or swollen eyelid, eye redness, or tearing on the affected side. ?Feeling restless or upset. ?Pale skin or a flushed face. ?How is this treated? ?Medicines. ?Oxygen that is breathed in through a mask. ?Follow these instructions at home: ?Headache diary ?Keep a headache diary as told by your doctor. Doing this can help you and your doctor figure out what triggers your headaches. In your headache diary, include information about: ?The time of day that your headache started and what you were doing when it began. ?How long your headache lasted. ?Where your pain started and whether it moved to other  areas. ?The type of pain. ?Your level of pain. Use a pain scale and rate the pain with a number from 1 (mild) up to 10 (very bad). ?The treatment that you used, and any change in symptoms after treatment. ? ?Medicines ?Take over-the-counter and prescription medicines only as told by your health care provider. ?Ask your doctor if the medicine prescribed to you: ?Requires you to avoid driving or using machinery. ?Can cause trouble pooping (constipation). You may need to take these actions to prevent or treat trouble pooping: ?Drink enough fluid to keep your pee (urine) pale yellow. ?Take over-the-counter or prescription medicines. ?Eat foods that are high in fiber. These include beans, whole grains, and fresh fruits and vegetables. ?Limit foods that are high in fat and processed sugars. These include fried or sweet foods. ?Lifestyle ? ?Go to bed at the same time each night. Get the same amount of sleep every night. Get 7-9 hours of sleep each night, or the amount recommended by your doctor. ?Limit or manage stress. ?Exercise regularly. Exercise for at least 30 minutes, 5 times each week. ?Eat a healthy diet. Avoid any foods that you know may trigger your headaches. ?Do not drink alcohol. ?Do not use any products that contain nicotine or tobacco, such as cigarettes, e-cigarettes, and chewing tobacco. If you need help quitting, ask your doctor. ?General instructions ?Use oxygen as told by your doctor. ?Keep all follow-up visits as told by your health care provider. This is important. ?Contact a doctor if: ?Your headaches: ?Change. ?Get worse. ?Happen more often. ?Your medicines  or oxygen are not helping. ?Get help right away if: ?You faint. ?You get weak or lose feeling (have numbness) on one side of your body or face. ?You see two of everything (double vision). ?You feel you may vomit or you vomit and it does stop after many hours. ?You have trouble with your balance or with walking. ?You have trouble talking. ?You  have neck pain or stiffness and you have a fever. ?Summary ?Cluster headaches hurt a lot. ?Keep a headache diary. ?Do not drink alcohol. ?Medicines and oxygen may help you feel better. ?This information is not intended to replace advice given to you by your health care provider. Make sure you discuss any questions you have with your health care provider. ?Document Revised: 01/11/2020 Document Reviewed: 06/14/2019 ?Elsevier Patient Education ? [place]. ? ?

## 2021-08-19 NOTE — Assessment & Plan Note (Addendum)
Reports intermittent bloating and burning sensation in epigastric region ?Last EGD 2019: chronic gastritis and duodenitis ?Chronic, worse in last 42month ?Admits to not always avoiding trigger food items ?No change in BM or appetite, no weight loss, no nausea ? ?Increase omeprazole to '20mg'$  BID x 2weeks, then decrease to '20mg'$  daily ?Advised to avoid trigger foods ? ?

## 2021-08-20 ENCOUNTER — Ambulatory Visit: Payer: PPO | Admitting: Physical Therapy

## 2021-08-20 ENCOUNTER — Encounter: Payer: Self-pay | Admitting: Physical Therapy

## 2021-08-20 DIAGNOSIS — M62830 Muscle spasm of back: Secondary | ICD-10-CM | POA: Diagnosis not present

## 2021-08-20 DIAGNOSIS — M545 Low back pain, unspecified: Secondary | ICD-10-CM

## 2021-08-20 DIAGNOSIS — M6281 Muscle weakness (generalized): Secondary | ICD-10-CM

## 2021-08-20 NOTE — Therapy (Signed)
[facility] [address] ?[place], [place], [address] ?Phone: [phone]   Fax:  [phone] ? ?Physical Therapy Treatment ? ?Patient Details  ?Name: Nicholas Rasmussen ?MRN: [id number] ?Date of Birth: May 10, 1946 ?Referring Provider (PT): Nche ? ? ?Encounter Date: 08/20/2021 ? ? PT End of Session - 08/20/21 1424   ? ? Visit Number 15   ? Date for PT Re-Evaluation 09/06/21   ? Authorization Type [employer]   ? Authorization Time Period 06/23/21 to 08/04/21   ? PT Start Time 1345   ? PT Stop Time 1430   ? PT Time Calculation (min) 45 min   ? Activity Tolerance Patient tolerated treatment well   ? Behavior During Therapy [facility] for tasks assessed/performed   ? ?  ?  ? ?  ? ? ?Past Medical History:  ?Diagnosis Date  ? Allergic rhinitis 05/07/2014  ? Dizziness 01/02/2016  ? GERD (gastroesophageal reflux disease)   ? Headache 03/13/2015  ? Left flank pain 01/02/2016  ? Low back pain 06/06/2015  ? Pain of left side of body 08/13/2015  ? Post herpetic neuralgia   ? Routine general medical examination at a health care facility 05/07/2014  ? Seizure disorder ([place]) 05/07/2014  ? Seizures ([place])   ? Last seizure in the 70s.  ? Shingles   ? Stye 05/07/2014  ? ? ?Past Surgical History:  ?Procedure Laterality Date  ? COLONOSCOPY    ? KNEE SURGERY Left   ? left miniscus  ? UPPER GASTROINTESTINAL ENDOSCOPY    ? uvala surgery    ? ? ?There were no vitals filed for this visit. ? ? Subjective Assessment - 08/20/21 1342   ? ? Subjective "Pretty good"   ? Currently in Pain? Yes   ? Pain Score 4    ? Pain Location Back   ? Pain Orientation Left   ? ?  ?  ? ?  ? ? ? ? ? ? ? ? ? ? ? ? ? ? ? ? ? ? ? ? [facility] Adult PT Treatment/Exercise - 08/20/21 0001   ? ?  ? Lumbar Exercises: Stretches  ? Single Knee to Chest Stretch Left;Right;3 reps;10 seconds;20 seconds   ?  ? Lumbar Exercises: Aerobic  ? Elliptical L5 x6  min   ?  ? Lumbar Exercises: Machines for Strengthening  ? Cybex Lumbar Extension  black tband 2 sets 15   ? Cybex Knee Extension 20lb 2x12   ? Cybex Knee Flexion 55lb 2x12   ? Other Lumbar Machine Exercise lat pull down and rows 2 sets 15 45#   ?  ? Lumbar Exercises: Supine  ? Bridge Compliant;15 reps;2 seconds   ? Single Leg Bridge Compliant;10 reps;2 seconds   ? Other Supine Lumbar Exercises Biat SLR   ?  ? Lumbar Exercises: Quadruped  ? Plank 30'' x3   ?  ? Knee/Hip Exercises: Standing  ? Forward Step Up Both;1 set;10 reps;Hand Hold: 0;Step Height: 8"   7lb dumbbell in oposide hand  ? ?  ?  ? ?  ? ? ? ? ? ? ? ? ? ? ? ? PT Short Term Goals - 08/13/21 1510   ? ?  ? PT SHORT TERM GOAL #1  ? Title Will be independent with appropriate HEP to be progressed PRN   ? Status Achieved   ?  ? PT SHORT TERM GOAL #2  ? Title Pain to be no more than 2/10 in order to improve  QOL and sleep   ? Status Partially Met   ?  ? PT SHORT TERM GOAL #3  ? Title Will be able to maintain upright posture with good core activation at least 75% of the time to help in reducing low back and thoracic/cervical pain   ? Status Achieved   ?  ? PT SHORT TERM GOAL #4  ? Title Lumbar ROM to be no more than 20% limited and  hamstring/piriformis/hip flexor flexibility to have improved by 50% to improve general mobility   ? Status Achieved   ? ?  ?  ? ?  ? ? ? ? PT Long Term Goals - 08/20/21 1425   ? ?  ? PT LONG TERM GOAL #1  ? Title MMT to have improved by at least 1 grade in all weak groups to help reduce pain   ? Status Partially Met   ?  ? PT LONG TERM GOAL #2  ? Title Will be able to tolerate driving for at least 2 hours before needing to take a break without increase in pain so that he can travel to see family more easily   ? Status Achieved   ?  ? PT LONG TERM GOAL #3  ? Title Will demonstrate functionally correct lifting and general biomechanics without increase in pain to help prevent recurrence of symptoms   ? Status Partially Met   ?  ? PT LONG TERM GOAL #4  ? Title FOTO score to have improved by at least 10 points to show  general improvement in mobility and function   ? ?  ?  ? ?  ? ? ? ? ? ? ? ? Plan - 08/20/21 1426   ? ? Clinical Impression Statement Pt continues to do well in therapy despite a constant 4/10 pain rating. Increase reports tolerated with seated rows and lats. Added back prone planks, cue need not to allow hips to dip down. Cue for core engagement needed with step ups.   ? Personal Factors and Comorbidities Behavior Pattern;Past/Current Experience;Time since onset of injury/illness/exacerbation   ? Examination-Activity Limitations Locomotion Level;Transfers;Bed Mobility;Sit;Sleep;Carry;Squat;Stairs;Stand;Lift   ? Examination-Participation Restrictions Cleaning;Community Activity;Driving;Shop;Yard Work   ? Stability/Clinical Decision Making Stable/Uncomplicated   ? Rehab Potential Good   ? PT Frequency 1x / week   ? PT Duration 6 weeks   ? ?  ?  ? ?  ? ? ?Patient will benefit from skilled therapeutic intervention in order to improve the following deficits and impairments:  Decreased range of motion, Increased fascial restricitons, Difficulty walking, Increased muscle spasms, Decreased activity tolerance, Pain, Hypomobility, Impaired flexibility, Improper body mechanics, Decreased strength, Postural dysfunction ? ?Visit Diagnosis: ?Muscle spasm of back ? ?Chronic midline low back pain without sciatica ? ?Acute bilateral low back pain without sciatica ? ?Muscle weakness (generalized) ? ? ? ? ?Problem List ?Patient Active Problem List  ? Diagnosis Date Noted  ? Hyperlipidemia associated with type 2 diabetes mellitus ([place]) 08/19/2021  ? Yeast infection 07/03/2021  ? Shortness of breath 06/05/2021  ? Breast pain 06/05/2021  ? Atypical chest pain 06/05/2021  ? History of COVID-19 06/05/2021  ? [name] 04/07/2021  ? Complex renal cyst 04/07/2021  ? Chronic right-sided low back pain without sciatica 03/31/2020  ? Varicose veins of both lower extremities with pain 02/02/2019  ?  Encounter for therapeutic drug level monitoring 09/19/2018  ? Type 2 diabetes mellitus with diabetic polyneuropathy, without long-term current use of insulin ([place]) 03/29/2018  ?  Diabetic neuropathy ([place]) 03/29/2018  ? Chronic pain of left knee 03/23/2018  ? CAD (coronary artery disease) 08/03/2017  ? Post herpetic neuralgia 07/29/2016  ? Dizziness 01/02/2016  ? Chronic midline low back pain without sciatica 06/06/2015  ? GERD (gastroesophageal reflux disease) 11/15/2014  ? Seizure disorder ([place]) 05/07/2014  ? Allergic rhinitis 05/07/2014  ? ? ?Scot Jun, PTA ?08/20/2021, 2:29 PM ? ?[facility] ?[place] [address] ?[place], [place], [address] ?Phone: [phone]   Fax:  [phone] ? ?Name: Nicholas Rasmussen ?MRN: [id number] ?Date of Birth: 1945-05-28 ? ? ? ?

## 2021-08-27 ENCOUNTER — Ambulatory Visit: Payer: PPO | Attending: Nurse Practitioner | Admitting: Physical Therapy

## 2021-08-27 ENCOUNTER — Encounter: Payer: Self-pay | Admitting: Physical Therapy

## 2021-08-27 DIAGNOSIS — G8929 Other chronic pain: Secondary | ICD-10-CM | POA: Insufficient documentation

## 2021-08-27 DIAGNOSIS — M545 Low back pain, unspecified: Secondary | ICD-10-CM | POA: Diagnosis present

## 2021-08-27 DIAGNOSIS — M79605 Pain in left leg: Secondary | ICD-10-CM | POA: Diagnosis present

## 2021-08-27 DIAGNOSIS — M62830 Muscle spasm of back: Secondary | ICD-10-CM | POA: Diagnosis present

## 2021-08-27 DIAGNOSIS — M6281 Muscle weakness (generalized): Secondary | ICD-10-CM | POA: Diagnosis present

## 2021-08-27 NOTE — Therapy (Signed)
[facility] ?[place] [address] ?[place], [place], [address] ?Phone: [phone]   Fax:  [phone] ? ?Physical Therapy Treatment ? ?Patient Details  ?Name: Nicholas Rasmussen ?MRN: [id number] ?Date of Birth: 1945/09/09 ?Referring Provider (PT): Nche ? ? ?Encounter Date: 08/27/2021 ? ? PT End of Session - 08/27/21 1425   ? ? Visit Number 16   ? Date for PT Re-Evaluation 09/06/21   ? Authorization Type [employer]   ? PT Start Time 1345   ? PT Stop Time 1426   ? PT Time Calculation (min) 41 min   ? Activity Tolerance Patient tolerated treatment well   ? Behavior During Therapy [facility] for tasks assessed/performed   ? ?  ?  ? ?  ? ? ?Past Medical History:  ?Diagnosis Date  ? Allergic rhinitis 05/07/2014  ? Dizziness 01/02/2016  ? GERD (gastroesophageal reflux disease)   ? Headache 03/13/2015  ? Left flank pain 01/02/2016  ? Low back pain 06/06/2015  ? Pain of left side of body 08/13/2015  ? Post herpetic neuralgia   ? Routine general medical examination at a health care facility 05/07/2014  ? Seizure disorder ([place]) 05/07/2014  ? Seizures ([place])   ? Last seizure in the 70s.  ? Shingles   ? Stye 05/07/2014  ? ? ?Past Surgical History:  ?Procedure Laterality Date  ? COLONOSCOPY    ? KNEE SURGERY Left   ? left miniscus  ? UPPER GASTROINTESTINAL ENDOSCOPY    ? uvala surgery    ? ? ?There were no vitals filed for this visit. ? ? Subjective Assessment - 08/27/21 1353   ? ? Subjective Doing ok, some low back pain with certain movements   ? Currently in Pain? Yes   ? Pain Score 3    ? Pain Location Back   ? Pain Orientation Left   ? ?  ?  ? ?  ? ? ? ? ? ? ? ? ? ? ? ? ? ? ? ? ? ? ? ? [facility] Adult PT Treatment/Exercise - 08/27/21 0001   ? ?  ? Lumbar Exercises: Aerobic  ? Elliptical L5 x6  min   ?  ? Lumbar Exercises: Machines for Strengthening  ? Cybex Lumbar Extension black tband 2 sets 15   ? Leg Press 100lb 2x15   ? Other Lumbar Machine Exercise lat pull down and rows 2 sets 10 55#   ?   ? Lumbar Exercises: Standing  ? Shoulder Extension Strengthening;Both;20 reps   ? Shoulder Extension Limitations 15   ? Other Standing Lumbar Exercises Overhead Ext and twist yellow ball, Standing marches 20lb KB   ? Other Standing Lumbar Exercises 25lb AR press   ?  ? Knee/Hip Exercises: Standing  ? Hip Extension Both;2 sets;Stengthening;10 reps;Knee straight   ? Extension Limitations 15   ?  ? Knee/Hip Exercises: Seated  ? Sit to Sand 2 sets;10 reps;without UE support   OHP yellow ball  ? ?  ?  ? ?  ? ? ? ? ? ? ? ? ? ? ? ? PT Short Term Goals - 08/13/21 1510   ? ?  ? PT SHORT TERM GOAL #1  ? Title Will be independent with appropriate HEP to be progressed PRN   ? Status Achieved   ?  ? PT SHORT TERM GOAL #2  ? Title Pain to be no more than 2/10 in order to improve QOL and sleep   ? Status Partially Met   ?  ?  PT SHORT TERM GOAL #3  ? Title Will be able to maintain upright posture with good core activation at least 75% of the time to help in reducing low back and thoracic/cervical pain   ? Status Achieved   ?  ? PT SHORT TERM GOAL #4  ? Title Lumbar ROM to be no more than 20% limited and  hamstring/piriformis/hip flexor flexibility to have improved by 50% to improve general mobility   ? Status Achieved   ? ?  ?  ? ?  ? ? ? ? PT Long Term Goals - 08/20/21 1425   ? ?  ? PT LONG TERM GOAL #1  ? Title MMT to have improved by at least 1 grade in all weak groups to help reduce pain   ? Status Partially Met   ?  ? PT LONG TERM GOAL #2  ? Title Will be able to tolerate driving for at least 2 hours before needing to take a break without increase in pain so that he can travel to see family more easily   ? Status Achieved   ?  ? PT LONG TERM GOAL #3  ? Title Will demonstrate functionally correct lifting and general biomechanics without increase in pain to help prevent recurrence of symptoms   ? Status Partially Met   ?  ? PT LONG TERM GOAL #4  ? Title FOTO score to have improved by at least 10 points to show general  improvement in mobility and function   ? ?  ?  ? ?  ? ? ? ? ? ? ? ? Plan - 08/27/21 1426   ? ? Clinical Impression Statement Pt enters clinic reporting less pain overall. Continues progression what postural core strength. Some core weakness present with anti rotation al presses. Increase resistance tolerated with seated rows and lats. Postural cue required with seated rows not to allow shoulder to protract.   ? Personal Factors and Comorbidities Behavior Pattern;Past/Current Experience;Time since onset of injury/illness/exacerbation   ? Examination-Activity Limitations Locomotion Level;Transfers;Bed Mobility;Sit;Sleep;Carry;Squat;Stairs;Stand;Lift   ? Examination-Participation Restrictions Cleaning;Community Activity;Driving;Shop;Yard Work   ? Stability/Clinical Decision Making Stable/Uncomplicated   ? Rehab Potential Good   ? PT Frequency 1x / week   ? PT Duration 6 weeks   ? PT Treatment/Interventions ADLs/Self Care Home Management;Cryotherapy;Electrical Stimulation;Iontophoresis 4mg /ml Dexamethasone;Moist Heat;Ultrasound;Gait training;Stair training;Functional mobility training;Therapeutic activities;Therapeutic exercise;Balance training;Neuromuscular re-education;Patient/family education;Manual techniques;Passive range of motion;Dry needling;Energy conservation;Taping;Visual/perceptual remediation/compensation   ? PT Next Visit Plan continue focus on lumbar mobility and core strength, BM for lifting   ? ?  ?  ? ?  ? ? ?Patient will benefit from skilled therapeutic intervention in order to improve the following deficits and impairments:  Decreased range of motion, Increased fascial restricitons, Difficulty walking, Increased muscle spasms, Decreased activity tolerance, Pain, Hypomobility, Impaired flexibility, Improper body mechanics, Decreased strength, Postural dysfunction ? ?Visit Diagnosis: ?Muscle spasm of back ? ?Chronic midline low back pain without sciatica ? ?Acute bilateral low back pain without  sciatica ? ?Muscle weakness (generalized) ? ? ? ? ?Problem List ?Patient Active Problem List  ? Diagnosis Date Noted  ? Hyperlipidemia associated with type 2 diabetes mellitus ([place]) 08/19/2021  ? Yeast infection 07/03/2021  ? Shortness of breath 06/05/2021  ? Breast pain 06/05/2021  ? Atypical chest pain 06/05/2021  ? History of COVID-19 06/05/2021  ? [name] 04/07/2021  ? Complex renal cyst 04/07/2021  ? Chronic right-sided low back pain without sciatica 03/31/2020  ? Varicose veins of  both lower extremities with pain 02/02/2019  ? Encounter for therapeutic drug level monitoring 09/19/2018  ? Type 2 diabetes mellitus with diabetic polyneuropathy, without long-term current use of insulin ([place]) 03/29/2018  ? Diabetic neuropathy ([place]) 03/29/2018  ? Chronic pain of left knee 03/23/2018  ? CAD (coronary artery disease) 08/03/2017  ? Post herpetic neuralgia 07/29/2016  ? Dizziness 01/02/2016  ? Chronic midline low back pain without sciatica 06/06/2015  ? GERD (gastroesophageal reflux disease) 11/15/2014  ? Seizure disorder ([place]) 05/07/2014  ? Allergic rhinitis 05/07/2014  ? ? ?Scot Jun, PTA ?08/27/2021, 2:29 PM ? ?[facility] ?[place] [address] ?[place], [place], [address] ?Phone: [phone]   Fax:  [phone] ? ?Name: Nicholas Rasmussen ?MRN: [id number] ?Date of Birth: 09-30-1945 ? ? ? ?

## 2021-09-01 ENCOUNTER — Ambulatory Visit: Payer: PPO | Admitting: Physical Therapy

## 2021-09-01 ENCOUNTER — Telehealth: Payer: Self-pay

## 2021-09-01 ENCOUNTER — Encounter: Payer: Self-pay | Admitting: Physical Therapy

## 2021-09-01 DIAGNOSIS — M545 Low back pain, unspecified: Secondary | ICD-10-CM

## 2021-09-01 DIAGNOSIS — M62830 Muscle spasm of back: Secondary | ICD-10-CM

## 2021-09-01 DIAGNOSIS — G8929 Other chronic pain: Secondary | ICD-10-CM

## 2021-09-01 DIAGNOSIS — M6281 Muscle weakness (generalized): Secondary | ICD-10-CM

## 2021-09-01 NOTE — Therapy (Signed)
[facility] ?[place], [place], [address] ?Phone: [phone]   Fax:  [phone] ? ?Physical Therapy Treatment ? ?Patient Details  ?Name: Nicholas Rasmussen ?MRN: [id number] ?Date of Birth: 1946-05-07 ?Referring Provider (PT): Nche ? ? ?Encounter Date: 09/01/2021 ? ? PT End of Session - 09/01/21 1135   ? ? Visit Number 17   ? Date for PT Re-Evaluation 09/06/21   ? PT Start Time 1100   ? PT Stop Time 1145   ? PT Time Calculation (min) 45 min   ? Activity Tolerance Patient tolerated treatment well   ? Behavior During Therapy [facility] for tasks assessed/performed   ? ?  ?  ? ?  ? ? ?Past Medical History:  ?Diagnosis Date  ? Allergic rhinitis 05/07/2014  ? Dizziness 01/02/2016  ? GERD (gastroesophageal reflux disease)   ? Headache 03/13/2015  ? Left flank pain 01/02/2016  ? Low back pain 06/06/2015  ? Pain of left side of body 08/13/2015  ? Post herpetic neuralgia   ? Routine general medical examination at a health care facility 05/07/2014  ? Seizure disorder ([place]) 05/07/2014  ? Seizures ([place])   ? Last seizure in the 70s.  ? Shingles   ? Stye 05/07/2014  ? ? ?Past Surgical History:  ?Procedure Laterality Date  ? COLONOSCOPY    ? KNEE SURGERY Left   ? left miniscus  ? UPPER GASTROINTESTINAL ENDOSCOPY    ? uvala surgery    ? ? ?There were no vitals filed for this visit. ? ? Subjective Assessment - 09/01/21 1058   ? ? Subjective Everything is good   ? Currently in Pain? Yes   ? Pain Score 3    ? Pain Location Back   ? ?  ?  ? ?  ? ? ? ? ? ? ? ? ? ? ? ? ? ? ? ? ? ? ? ? [facility] Adult PT Treatment/Exercise - 09/01/21 0001   ? ?  ? Lumbar Exercises: Stretches  ? Single Knee to Chest Stretch Left;Right;10 seconds;20 seconds;4 reps   ? Double Knee to Chest Stretch 3 reps;10 seconds   ? Lower Trunk Rotation 4 reps;10 seconds   ?  ? Lumbar Exercises: Aerobic  ? Elliptical L5 x6  min   ?  ? Lumbar Exercises: Machines for Strengthening  ? Leg Press 100lb 2x15   ? Other Lumbar Machine  Exercise lat pull down and rows 2 sets 12 55#   ?  ? Lumbar Exercises: Standing  ? Shoulder Extension Strengthening;Both;20 reps   ? Shoulder Extension Limitations 15   ? Other Standing Lumbar Exercises Overhead Ext and twist yellow ball, Standing marches 20lb KB   ? Other Standing Lumbar Exercises 25lb AR press   ?  ? Knee/Hip Exercises: Stretches  ? Passive Hamstring Stretch Both;20 seconds;3 reps   ?  ? Knee/Hip Exercises: Standing  ? Hip Extension Both;2 sets;Stengthening;10 reps;Knee straight   ? Extension Limitations 15   ? ?  ?  ? ?  ? ? ? ? ? ? ? ? ? ? ? ? PT Short Term Goals - 08/13/21 1510   ? ?  ? PT SHORT TERM GOAL #1  ? Title Will be independent with appropriate HEP to be progressed PRN   ? Status Achieved   ?  ? PT SHORT TERM GOAL #2  ? Title Pain to be no more than 2/10 in order to improve QOL and sleep   ?  Status Partially Met   ?  ? PT SHORT TERM GOAL #3  ? Title Will be able to maintain upright posture with good core activation at least 75% of the time to help in reducing low back and thoracic/cervical pain   ? Status Achieved   ?  ? PT SHORT TERM GOAL #4  ? Title Lumbar ROM to be no more than 20% limited and  hamstring/piriformis/hip flexor flexibility to have improved by 50% to improve general mobility   ? Status Achieved   ? ?  ?  ? ?  ? ? ? ? PT Long Term Goals - 08/20/21 1425   ? ?  ? PT LONG TERM GOAL #1  ? Title MMT to have improved by at least 1 grade in all weak groups to help reduce pain   ? Status Partially Met   ?  ? PT LONG TERM GOAL #2  ? Title Will be able to tolerate driving for at least 2 hours before needing to take a break without increase in pain so that he can travel to see family more easily   ? Status Achieved   ?  ? PT LONG TERM GOAL #3  ? Title Will demonstrate functionally correct lifting and general biomechanics without increase in pain to help prevent recurrence of symptoms   ? Status Partially Met   ?  ? PT LONG TERM GOAL #4  ? Title FOTO score to have improved by at  least 10 points to show general improvement in mobility and function   ? ?  ?  ? ?  ? ? ? ? ? ? ? ? Plan - 09/01/21 1141   ? ? Clinical Impression Statement Pt continues to report an overall improvement. He reports a constant 3.5/10 pain rating that is tolerable. No increase pain during session. Some trunk stiffness with cross body rotation. Tactile cue of posture needed wit hip extensions. Increase reps tolerated with seated rows and lats.   ? Personal Factors and Comorbidities Behavior Pattern;Past/Current Experience;Time since onset of injury/illness/exacerbation   ? Examination-Activity Limitations Locomotion Level;Transfers;Bed Mobility;Sit;Sleep;Carry;Squat;Stairs;Stand;Lift   ? Examination-Participation Restrictions Cleaning;Community Activity;Driving;Shop;Yard Work   ? Rehab Potential Good   ? PT Frequency 1x / week   ? PT Duration 6 weeks   ? PT Treatment/Interventions ADLs/Self Care Home Management;Cryotherapy;Electrical Stimulation;Iontophoresis 56m/ml Dexamethasone;Moist Heat;Ultrasound;Gait training;Stair training;Functional mobility training;Therapeutic activities;Therapeutic exercise;Balance training;Neuromuscular re-education;Patient/family education;Manual techniques;Passive range of motion;Dry needling;Energy conservation;Taping;Visual/perceptual remediation/compensation   ? PT Next Visit Plan continue focus on lumbar mobility and core strength, BM for lifting   ? ?  ?  ? ?  ? ? ?Patient will benefit from skilled therapeutic intervention in order to improve the following deficits and impairments:  Decreased range of motion, Increased fascial restricitons, Difficulty walking, Increased muscle spasms, Decreased activity tolerance, Pain, Hypomobility, Impaired flexibility, Improper body mechanics, Decreased strength, Postural dysfunction ? ?Visit Diagnosis: ?Muscle spasm of back ? ?Chronic midline low back pain without sciatica ? ?Muscle weakness (generalized) ? ? ? ? ?Problem List ?Patient Active  Problem List  ? Diagnosis Date Noted  ? Hyperlipidemia associated with type 2 diabetes mellitus ([place] 08/19/2021  ? Yeast infection 07/03/2021  ? Shortness of breath 06/05/2021  ? Breast pain 06/05/2021  ? Atypical chest pain 06/05/2021  ? History of COVID-19 06/05/2021  ? [name] 04/07/2021  ? Complex renal cyst 04/07/2021  ? Chronic right-sided low back pain without sciatica 03/31/2020  ? Varicose veins of both lower extremities with  pain 02/02/2019  ? Encounter for therapeutic drug level monitoring 09/19/2018  ? Type 2 diabetes mellitus with diabetic polyneuropathy, without long-term current use of insulin ([place]) 03/29/2018  ? Diabetic neuropathy ([place]) 03/29/2018  ? Chronic pain of left knee 03/23/2018  ? CAD (coronary artery disease) 08/03/2017  ? Post herpetic neuralgia 07/29/2016  ? Dizziness 01/02/2016  ? Chronic midline low back pain without sciatica 06/06/2015  ? GERD (gastroesophageal reflux disease) 11/15/2014  ? Seizure disorder ([place]) 05/07/2014  ? Allergic rhinitis 05/07/2014  ? ? ?Scot Jun, PTA ?09/01/2021, 11:44 AM ? ?[facility] ?[address] ?[address] ?[place], [place], [address] ?Phone: [phone]   Fax:  [phone] ? ?Name: Nicholas Rasmussen ?MRN: [id number] ?Date of Birth: August 14, 1945 ? ? ? ?

## 2021-09-01 NOTE — Progress Notes (Signed)
Chronic Care Management  APPOINTMENT REMINDER ? ? ?Nicholas Rasmussen was reminded to have all medications, supplements and any blood glucose and blood pressure readings available for review with [name], Pharm. D, at his telephone visit on 09/03/2021 at 11:00 am. ? ?Patient Confirm appointment. ? ?Anderson Malta ?Clinical Pharmacist Assistant ?[phone]  ? ?

## 2021-09-03 ENCOUNTER — Encounter: Payer: Self-pay | Admitting: Physical Therapy

## 2021-09-03 ENCOUNTER — Ambulatory Visit (INDEPENDENT_AMBULATORY_CARE_PROVIDER_SITE_OTHER): Payer: PPO

## 2021-09-03 ENCOUNTER — Ambulatory Visit: Payer: PPO | Admitting: Physical Therapy

## 2021-09-03 DIAGNOSIS — G40909 Epilepsy, unspecified, not intractable, without status epilepticus: Secondary | ICD-10-CM

## 2021-09-03 DIAGNOSIS — M6281 Muscle weakness (generalized): Secondary | ICD-10-CM

## 2021-09-03 DIAGNOSIS — E785 Hyperlipidemia, unspecified: Secondary | ICD-10-CM

## 2021-09-03 DIAGNOSIS — M62830 Muscle spasm of back: Secondary | ICD-10-CM | POA: Diagnosis not present

## 2021-09-03 DIAGNOSIS — E1169 Type 2 diabetes mellitus with other specified complication: Secondary | ICD-10-CM

## 2021-09-03 DIAGNOSIS — M545 Low back pain, unspecified: Secondary | ICD-10-CM

## 2021-09-03 DIAGNOSIS — E1142 Type 2 diabetes mellitus with diabetic polyneuropathy: Secondary | ICD-10-CM

## 2021-09-03 NOTE — Patient Instructions (Signed)
Visit Information ?It was great speaking with you today!  Please let me know if you have any questions about our visit. ? ? Goals Addressed   ? ?  ?  ?  ?  ? This Visit's Progress  ?  Monitor and Manage My Blood Sugar-Diabetes Type 2     ?  Timeframe:  Long-Range Goal ?Priority:  High ?Start Date: 09/03/2021                            ?Expected End Date: 09/04/2022                     ? ?Follow Up as needed ?  ?- check blood sugar at prescribed times ?- check blood sugar if I feel it is too high or too low ?- enter blood sugar readings and medication or insulin into daily log  ?  ?Why is this important?   ?Checking your blood sugar at home helps to keep it from getting very high or very low.  ?Writing the results in a diary or log helps the doctor know how to care for you.  ?Your blood sugar log should have the time, date and the results.  ?Also, write down the amount of insulin or other medicine that you take.  ?Other information, like what you ate, exercise done and how you were feeling, will also be helpful.   ?  ?Notes:  ?  ? ?  ? ? ?Patient Care Plan: General Pharmacy (Adult)  ?  ? ?Problem Identified: Hyperlipidemia, Diabetes, GERD, and Seizures   ?Priority: High  ?  ? ?Long-Range Goal: Patient-Specific Goal   ?Start Date: 09/03/2021  ?Expected End Date: 09/04/2022  ?This Visit's Progress: On track  ?Priority: High  ?Note:   ?Current Barriers:  ?Unable to independently afford treatment regimen ? ?Pharmacist Clinical Goal(s):  ?Patient will verbalize ability to afford treatment regimen through collaboration with PharmD and provider.  ? ?Interventions: ?1:1 collaboration with Nche, Charlene Brooke, NP regarding development and update of comprehensive plan of care as evidenced by provider attestation and co-signature ?Inter-disciplinary care team collaboration (see longitudinal plan of care) ?Comprehensive medication review performed; medication list updated in electronic medical record ? ?Hyperlipidemia: (LDL goal <  100) ?-Controlled ?-Current treatment: ?Atorvastatin 20 mg daily ?-Medications previously tried: NA  ?-Recommended to continue current medication ? ?Diabetes (A1c goal <7%) ?-Controlled ?-Current medications: ?Jardiance 10 mg daily: Appropriate, Effective, Safe, Query accessible ?-Medications previously tried: Glipizide, Metformin  ?-Denies hypoglycemic/hyperglycemic symptoms ?-Patient reports affordability concerns with Jardiance; will start with PAP.  ?-Recommended to continue current medication ? ? ?Patient Goals/Self-Care Activities ?Patient will:  ?- check glucose daily before breakfast, document, and provide at future appointments ? ?Follow Up Plan: No further follow up required: Patient will reach out to clinical pharmacist as needed. ?  ? ? ? ?Patient agreed to services and verbal consent obtained.  ? ?Patient verbalizes understanding of instructions and care plan provided today and agrees to view in [place]. Active MyChart status confirmed with patient.   ? ?[name], PharmD, BCACP, CPP ?Clinical Pharmacist Practitioner  ?[facility]  ?[phone] ?  ?

## 2021-09-03 NOTE — Therapy (Signed)
[facility] ?[address] ?[place], [place], [address] ?Phone: [phone]   Fax:  [phone] ? ?Physical Therapy Treatment ? ?Patient Details  ?Name: Nicholas Rasmussen ?MRN: [id number] ?Date of Birth: 07/12/1945 ?Referring Provider (PT): Nche ? ? ?Encounter Date: 09/03/2021 ? ? PT End of Session - 09/03/21 1552   ? ? Visit Number 18   ? Date for PT Re-Evaluation 09/06/21   ? PT Start Time 1515   ? PT Stop Time 1600   ? PT Time Calculation (min) 45 min   ? Activity Tolerance Patient tolerated treatment well   ? Behavior During Therapy WFL for tasks assessed/performed   ? ?  ?  ? ?  ? ? ?Past Medical History:  ?Diagnosis Date  ? Allergic rhinitis 05/07/2014  ? Dizziness 01/02/2016  ? GERD (gastroesophageal reflux disease)   ? Headache 03/13/2015  ? Left flank pain 01/02/2016  ? Low back pain 06/06/2015  ? Pain of left side of body 08/13/2015  ? Post herpetic neuralgia   ? Routine general medical examination at a health care facility 05/07/2014  ? Seizure disorder ([facility]) 05/07/2014  ? Seizures ([facility])   ? Last seizure in the 70s.  ? Shingles   ? Stye 05/07/2014  ? ? ?Past Surgical History:  ?Procedure Laterality Date  ? COLONOSCOPY    ? KNEE SURGERY Left   ? left miniscus  ? UPPER GASTROINTESTINAL ENDOSCOPY    ? uvala surgery    ? ? ?There were no vitals filed for this visit. ? ? Subjective Assessment - 09/03/21 1517   ? ? Subjective Everything is good   ? Currently in Pain? Yes   ? Pain Score 3    ? ?  ?  ? ?  ? ? ? ? ? ? ? ? ? ? ? ? ? ? ? ? ? ? ? ? [facility] Adult PT Treatment/Exercise - 09/03/21 0001   ? ?  ? Lumbar Exercises: Aerobic  ? Elliptical L5 x6  min   ?  ? Lumbar Exercises: Machines for Strengthening  ? Cybex Knee Flexion 55lb 2x15   ? Leg Press 120lb 2x15   ? Other Lumbar Machine Exercise lat pull down and rows 2 sets 15 55#   ?  ? Lumbar Exercises: Standing  ? Other Standing Lumbar Exercises Overhead Ext and twist yellow ball, Standing marches 20lb KB   ? Other  Standing Lumbar Exercises 20lb farmers carry, AR press [id number]   ?  ? Knee/Hip Exercises: Standing  ? Hip Extension Both;2 sets;Stengthening;10 reps;Knee straight   ? Extension Limitations 20   ? ?  ?  ? ?  ? ? ? ? ? ? ? ? ? ? ? ? PT Short Term Goals - 08/13/21 1510   ? ?  ? PT SHORT TERM GOAL #1  ? Title Will be independent with appropriate HEP to be progressed PRN   ? Status Achieved   ?  ? PT SHORT TERM GOAL #2  ? Title Pain to be no more than 2/10 in order to improve QOL and sleep   ? Status Partially Met   ?  ? PT SHORT TERM GOAL #3  ? Title Will be able to maintain upright posture with good core activation at least 75% of the time to help in reducing low back and thoracic/cervical pain   ? Status Achieved   ?  ? PT SHORT TERM GOAL #4  ? Title Lumbar ROM to be   no more than 20% limited and  hamstring/piriformis/hip flexor flexibility to have improved by 50% to improve general mobility   ? Status Achieved   ? ?  ?  ? ?  ? ? ? ? PT Long Term Goals - 09/03/21 1552   ? ?  ? PT LONG TERM GOAL #1  ? Title MMT to have improved by at least 1 grade in all weak groups to help reduce pain   ? Status Partially Met   ?  ? PT LONG TERM GOAL #2  ? Status Achieved   ?  ? PT LONG TERM GOAL #3  ? Title Will demonstrate functionally correct lifting and general biomechanics without increase in pain to help prevent recurrence of symptoms   ? Status Partially Met   ?  ? PT LONG TERM GOAL #4  ? Title FOTO score to have improved by at least 10 points to show general improvement in mobility and function   ? Status Partially Met   ? ?  ?  ? ?  ? ? ? ? ? ? ? ? Plan - 09/03/21 1553   ? ? Clinical Impression Statement Pt enters with a tolerable 3.5/10 low back pain. Increase resistance tolerated with leg press and leg extensions. Trunk stiffness remains with cross body extensions. Postural cues required today with seated rows. Cues for core engagement with farmers carry.   ? Personal Factors and Comorbidities Behavior Pattern;Past/Current  Experience;Time since onset of injury/illness/exacerbation   ? Examination-Activity Limitations Locomotion Level;Transfers;Bed Mobility;Sit;Sleep;Carry;Squat;Stairs;Stand;Lift   ? Examination-Participation Restrictions Cleaning;Community Activity;Driving;Shop;Yard Work   ? Stability/Clinical Decision Making Stable/Uncomplicated   ? Rehab Potential Good   ? PT Frequency 1x / week   ? PT Duration 6 weeks   ? PT Treatment/Interventions ADLs/Self Care Home Management;Cryotherapy;Electrical Stimulation;Iontophoresis 51m/ml Dexamethasone;Moist Heat;Ultrasound;Gait training;Stair training;Functional mobility training;Therapeutic activities;Therapeutic exercise;Balance training;Neuromuscular re-education;Patient/family education;Manual techniques;Passive range of motion;Dry needling;Energy conservation;Taping;Visual/perceptual remediation/compensation   ? PT Next Visit Plan continue focus on lumbar mobility and core strength, BM for lifting recert  ? ?  ?  ? ?  ? ? ?Patient will benefit from skilled therapeutic intervention in order to improve the following deficits and impairments:  Decreased range of motion, Increased fascial restricitons, Difficulty walking, Increased muscle spasms, Decreased activity tolerance, Pain, Hypomobility, Impaired flexibility, Improper body mechanics, Decreased strength, Postural dysfunction ? ?Visit Diagnosis: ?Muscle spasm of back ? ?Chronic midline low back pain without sciatica ? ?Muscle weakness (generalized) ? ?Acute bilateral low back pain without sciatica ? ? ? ? ?Problem List ?Patient Active Problem List  ? Diagnosis Date Noted  ? Hyperlipidemia associated with type 2 diabetes mellitus ([facility] 08/19/2021  ? Yeast infection 07/03/2021  ? Shortness of breath 06/05/2021  ? Breast pain 06/05/2021  ? Atypical chest pain 06/05/2021  ? History of COVID-19 06/05/2021  ? [name] 04/07/2021  ? Complex renal cyst 04/07/2021  ? Chronic right-sided low back  pain without sciatica 03/31/2020  ? Varicose veins of both lower extremities with pain 02/02/2019  ? Encounter for therapeutic drug level monitoring 09/19/2018  ? Type 2 diabetes mellitus with diabetic polyneuropathy, without long-term current use of insulin ([facility] 03/29/2018  ? Diabetic neuropathy ([place] 03/29/2018  ? Chronic pain of left knee 03/23/2018  ? CAD (coronary artery disease) 08/03/2017  ? Post herpetic neuralgia 07/29/2016  ? Dizziness 01/02/2016  ? Chronic midline low back pain without sciatica 06/06/2015  ? GERD (gastroesophageal reflux disease) 11/15/2014  ? Seizure disorder ([place] 05/07/2014  ?  Allergic rhinitis 05/07/2014  ? ? ?Scot Jun, PTA ?09/03/2021, 3:57 PM ? ?[facility] ?[place] ?[address] ?[place], [place], [address] ?Phone: [phone]   Fax:  [phone] ? ?Name: Seiji Wiswell ?MRN: [id number] ?Date of Birth: 03-Jan-1946 ? ? ? ?

## 2021-09-03 NOTE — Progress Notes (Signed)
Chronic Care Management Pharmacy Note  09/03/2021 Name:  Nicholas Rasmussen MRN:  [id number] DOB:  08/28/1945  Summary: Patient presents for CCM follow-up. Affordability concerns with Jardiance.  Recommendations/Changes made from today's visit: Continue current medications  Start PAP for Jardiance.   Plan: No further follow up required: Patient will reach out to clinical pharmacist as needed.   Subjective: Nicholas Rasmussen is an 76 y.o. year old male who is a primary patient of Nche, Charlene Brooke, NP.  The CCM team was consulted for assistance with disease management and care coordination needs.    Engaged with patient by telephone for follow up visit in response to provider referral for pharmacy case management and/or care coordination services.   Consent to Services:  The patient was given information about Chronic Care Management services, agreed to services, and gave verbal consent prior to initiation of services.  Please see initial visit note for detailed documentation.   Patient Care Team: Nche, Charlene Brooke, NP as PCP - General (Internal Medicine) Belva Crome, MD as PCP - Cardiology (Cardiology) Germaine Pomfret, [facility] as Pharmacist (Pharmacist)  Recent office visits: 08/19/21: Patient presented to [name], NP for follow-up.  Recent consult visits: None noted.  Hospital visits: None in previous 6 months   Objective:  Lab Results  Component Value Date   CREATININE 1.26 06/12/2021   BUN 21 06/12/2021   GFR 56.69 (L) 06/12/2021   EGFR 66 11/18/2020   GFRNONAA 73 11/13/2019   GFRAA 84 11/13/2019   NA 141 06/12/2021   K 4.0 06/12/2021   CALCIUM 9.1 06/12/2021   CO2 31 06/12/2021   GLUCOSE 104 (H) 06/12/2021    Lab Results  Component Value Date/Time   HGBA1C 6.5 06/12/2021 08:28 AM   HGBA1C 6.2 (A) 03/02/2021 12:18 PM   HGBA1C 6.1 (A) 04/09/2020 09:45 AM   HGBA1C 6.2 10/08/2019 09:51 AM   GFR 56.69 (L) 06/12/2021 08:28 AM   GFR 66.74  06/05/2021 12:57 PM   MICROALBUR 1.1 02/10/2021 09:14 AM   MICROALBUR 1.5 10/08/2019 09:51 AM    Last diabetic Eye exam:  Lab Results  Component Value Date/Time   HMDIABEYEEXA No Retinopathy 01/23/2021 12:00 AM    Last diabetic Foot exam: No results found for: HMDIABFOOTEX   Lab Results  Component Value Date   CHOL 137 06/12/2021   HDL 45.70 06/12/2021   LDLCALC 68 06/12/2021   TRIG 117.0 06/12/2021   CHOLHDL 3 06/12/2021       Latest Ref Rng & Units 06/12/2021    8:28 AM 04/11/2019   10:31 AM 10/10/2018    9:27 AM  Hepatic Function  Total Protein 6.0 - 8.3 g/dL 7.2   7.2   7.1    Albumin 3.5 - 5.2 g/dL 4.0   4.2   4.1    AST 0 - 37 U/L $Remo'16   16   13    'pVhoG$ ALT 0 - 53 U/L $Remo'6   6   4    'lLkjn$ Alk Phosphatase 39 - 117 U/L 52   50   48    Total Bilirubin 0.2 - 1.2 mg/dL 0.3   0.5   0.5    Bilirubin, Direct 0.0 - 0.3 mg/dL 0.1   0.1   0.1      Lab Results  Component Value Date/Time   TSH 1.25 10/08/2019 09:51 AM   TSH 1.08 11/12/2014 11:32 AM       Latest Ref Rng & Units 06/05/2021   12:57 PM 10/31/2019  2:49 PM 10/15/2017    9:56 PM  CBC  WBC 4.0 - 10.5 K/uL 4.9   5.0   6.9    Hemoglobin 13.0 - 17.0 g/dL 12.9   12.3   12.4    Hematocrit 39.0 - 52.0 % 40.5   37.6   38.8    Platelets 150.0 - 400.0 K/uL 200.0   187.0   202      No results found for: VD25OH  Clinical ASCVD: No  The 10-year ASCVD risk score (Arnett DK, et al., 2019) is: 18.9%   Values used to calculate the score:     Age: 34 years     Sex: Male     Is Non-Hispanic African American: Yes     Diabetic: Yes     Tobacco smoker: No     Systolic Blood Pressure: 568 mmHg     Is BP treated: No     HDL Cholesterol: 45.7 mg/dL     Total Cholesterol: 137 mg/dL       06/30/2021    1:43 PM 05/26/2021    1:41 PM 05/26/2021    1:36 PM  Depression screen PHQ 2/9  Decreased Interest 0 0 0  Down, Depressed, Hopeless 0 0 0  PHQ - 2 Score 0 0 0    Social History   Tobacco Use  Smoking Status Former  Smokeless Tobacco  Never   BP Readings from Last 3 Encounters:  08/19/21 118/60  07/03/21 108/66  06/30/21 116/70   Pulse Readings from Last 3 Encounters:  08/19/21 62  07/03/21 (!) 52  06/30/21 (!) 49   Wt Readings from Last 3 Encounters:  08/19/21 239 lb 3.2 oz (108.5 kg)  07/03/21 241 lb 3.2 oz (109.4 kg)  06/30/21 240 lb 6.4 oz (109 kg)   BMI Readings from Last 3 Encounters:  08/19/21 29.12 kg/m  07/03/21 29.36 kg/m  06/30/21 29.26 kg/m    Assessment/Interventions: Review of patient past medical history, allergies, medications, health status, including review of consultants reports, laboratory and other test data, was performed as part of comprehensive evaluation and provision of chronic care management services.   SDOH:  (Social Determinants of Health) assessments and interventions performed: Yes SDOH Interventions    Flowsheet Row Most Recent Value  SDOH Interventions   Financial Strain Interventions Intervention Not Indicated      SDOH Screenings   Alcohol Screen: Low Risk    Last Alcohol Screening Score (AUDIT): 1  Depression (PHQ2-9): Low Risk    PHQ-2 Score: 0  Financial Resource Strain: Low Risk    Difficulty of Paying Living Expenses: Not hard at all  Food Insecurity: No Food Insecurity   Worried About Charity fundraiser in the Last Year: Never true   Ran Out of Food in the Last Year: Never true  Housing: Low Risk    Last Housing Risk Score: 0  Physical Activity: Sufficiently Active   Days of Exercise per Week: 2 days   Minutes of Exercise per Session: 100 min  Social Connections: Moderately Integrated   Frequency of Communication with Friends and Family: Twice a week   Frequency of Social Gatherings with Friends and Family: Twice a week   Attends Religious Services: More than 4 times per year   Active Member of [employer] or Organizations: No   Attends Archivist Meetings: Never   Marital Status: Married  Stress: No Stress Concern Present   Feeling of  Stress : Not at all  Tobacco Use: Medium  Risk   Smoking Tobacco Use: Former   Smokeless Tobacco Use: Never   Passive Exposure: Not on file  Transportation Needs: No Transportation Needs   Lack of Transportation (Medical): No   Lack of Transportation (Non-Medical): No    CCM Care Plan  No Known Allergies  Medications Reviewed Today     Reviewed by Teresa Pelton, PTA (Physical Therapy Assistant) on 09/01/21 at 1058  Med List Status: <None>   Medication Order Taking? Sig Documenting Provider Last Dose Status Informant  acetaminophen (TYLENOL) 500 MG tablet [id number] No Take 1 tablet (500 mg total) by mouth every 8 (eight) hours as needed. Nche, Bonna Gains, NP Taking Active   acyclovir (ZOVIRAX) 200 MG capsule [id number] No Take 1 capsule (200 mg total) by mouth 5 (five) times daily. Nche, Bonna Gains, NP Taking Active   atorvastatin (LIPITOR) 20 MG tablet [id number]  Take 1 tablet (20 mg total) by mouth daily at 6 PM. Nche, Bonna Gains, NP  Active   clotrimazole (LOTRIMIN) 1 % cream [id number]  APPLY TO AFFECTED AREA TWICE A DAY McElwee, Lauren A, NP  Active   diclofenac Sodium (VOLTAREN) 1 % GEL [id number] No Apply a small grape sized dollop to sore joint up to every 6 hours as needed Mliss Sax, MD Taking Active   divalproex (DEPAKOTE ER) 500 MG 24 hr tablet [id number] No TAKE 1 TABLET BY MOUTH AT BEDTIME (CALL [phone] FOR APPT) Levert Feinstein, MD Taking Active   empagliflozin (JARDIANCE) 10 MG TABS tablet [id number]  Take 1 tablet (10 mg total) by mouth daily. Anne Ng, NP  Active   gabapentin (NEURONTIN) 300 MG capsule [id number]  TAKE 1 CAPSULE BY MOUTH EVERYDAY AT BEDTIME Nche, Bonna Gains, NP  Active   ibuprofen (ADVIL) 800 MG tablet [id number] No Take 1 tablet (800 mg total) by mouth 3 (three) times daily. Valinda Hoar, NP Taking Active   meclizine (ANTIVERT) 25 MG tablet [id number] No Take 1 tablet (25 mg total) by mouth 3 (three) times daily as  needed. Nche, Bonna Gains, NP Taking Active   metoprolol succinate (TOPROL-XL) 25 MG 24 hr tablet [id number]  Take 1 tablet (25 mg total) by mouth daily. Anne Ng, NP  Active   omeprazole (PRILOSEC) 20 MG capsule [id number] No TAKE 1 CAPSULE BY MOUTH EVERY DAY AS NEEDED Nche, Bonna Gains, NP Taking Active             Patient Active Problem List   Diagnosis Date Noted   Hyperlipidemia associated with type 2 diabetes mellitus (HCC) 08/19/2021   Yeast infection 07/03/2021   Shortness of breath 06/05/2021   Breast pain 06/05/2021   Atypical chest pain 06/05/2021   History of COVID-19 06/05/2021   [name] 04/07/2021   Complex renal cyst 04/07/2021   Chronic right-sided low back pain without sciatica 03/31/2020   Varicose veins of both lower extremities with pain 02/02/2019   Encounter for therapeutic drug level monitoring 09/19/2018   Type 2 diabetes mellitus with diabetic polyneuropathy, without long-term current use of insulin (HCC) 03/29/2018   Diabetic neuropathy (HCC) 03/29/2018   Chronic pain of left knee 03/23/2018   CAD (coronary artery disease) 08/03/2017   Post herpetic neuralgia 07/29/2016   Dizziness 01/02/2016   Chronic midline low back pain without sciatica 06/06/2015   GERD (gastroesophageal reflux disease) 11/15/2014   Seizure disorder (HCC) 05/07/2014   Allergic rhinitis 05/07/2014    Immunization History  Administered Date(s) Administered  Fluad Quad(high Dose 65+) 02/01/2019, 02/26/2020, 03/02/2021   Influenza Whole 02/21/2014   Influenza, High Dose Seasonal PF 02/03/2016, 02/03/2017, 02/28/2018   Influenza,inj,Quad PF,6+ Mos 03/13/2015   PFIZER(Purple Top)SARS-COV-2 Vaccination 07/22/2019, 08/21/2019, 03/03/2020   Pneumococcal Conjugate-13 05/06/2015   Pneumococcal Polysaccharide-23 05/07/2014   Tdap 05/25/2011   Zoster Recombinat (Shingrix) 12/03/2016, 02/16/2017    Conditions to be  addressed/monitored:  Hyperlipidemia, Diabetes, GERD, and Seizures  Care Plan : General Pharmacy (Adult)  Updates made by Germaine Pomfret, RPH since 09/03/2021 12:00 AM     Problem: Hyperlipidemia, Diabetes, GERD, and Seizures   Priority: High     Long-Range Goal: Patient-Specific Goal   Start Date: 09/03/2021  Expected End Date: 09/04/2022  This Visit's Progress: On track  Priority: High  Note:   Current Barriers:  Unable to independently afford treatment regimen  Pharmacist Clinical Goal(s):  Patient will verbalize ability to afford treatment regimen through collaboration with PharmD and provider.   Interventions: 1:1 collaboration with Nche, Charlene Brooke, NP regarding development and update of comprehensive plan of care as evidenced by provider attestation and co-signature Inter-disciplinary care team collaboration (see longitudinal plan of care) Comprehensive medication review performed; medication list updated in electronic medical record  Hyperlipidemia: (LDL goal < 100) -Controlled -Current treatment: Atorvastatin 20 mg daily -Medications previously tried: NA  -Recommended to continue current medication  Diabetes (A1c goal <7%) -Controlled -Current medications: Jardiance 10 mg daily: Appropriate, Effective, Safe, Query accessible -Medications previously tried: Glipizide, Metformin  -Denies hypoglycemic/hyperglycemic symptoms -Patient reports affordability concerns with Jardiance; will start with PAP.  -Recommended to continue current medication   Patient Goals/Self-Care Activities Patient will:  - check glucose daily before breakfast, document, and provide at future appointments  Follow Up Plan: No further follow up required: Patient will reach out to clinical pharmacist as needed.      Medication Assistance: Application for Jardiance  medication assistance program. in process.  Anticipated assistance start date TBD.  See plan of care for additional  detail.  Patient's preferred pharmacy is:  [id number] - [place], [address] [place] [address] Phone: [phone] Fax: [phone]  [facility] [address], [place] [address] Phone: [phone] Fax: [phone]  Uses pill box? Yes Pt endorses 100% compliance  We discussed: Current pharmacy is preferred with insurance plan and patient is satisfied with pharmacy services Patient decided to: Continue current medication management strategy  Care Plan and Follow Up Patient Decision:  Patient agrees to Care Plan and Follow-up.  Plan: No further follow up required: Patient will reach out to clinical pharmacist as needed.  [name], PharmD, Para [place], CPP Clinical Pharmacist Practitioner  [facility]  [phone]

## 2021-09-07 MED ORDER — DIVALPROEX SODIUM ER 500 MG PO TB24: 500.0000 mg | ORAL_TABLET | Freq: Every day | ORAL | 1 refills | Status: DC

## 2021-09-07 NOTE — Addendum Note (Signed)
Addended by: Daron Offer A on: 09/07/2021 04:13 PM ? ? Modules accepted: Orders ? ?

## 2021-09-08 ENCOUNTER — Telehealth: Payer: Self-pay

## 2021-09-08 ENCOUNTER — Ambulatory Visit: Payer: PPO | Admitting: Physical Therapy

## 2021-09-08 ENCOUNTER — Encounter: Payer: Self-pay | Admitting: Physical Therapy

## 2021-09-08 DIAGNOSIS — G8929 Other chronic pain: Secondary | ICD-10-CM

## 2021-09-08 DIAGNOSIS — M6281 Muscle weakness (generalized): Secondary | ICD-10-CM

## 2021-09-08 DIAGNOSIS — M545 Low back pain, unspecified: Secondary | ICD-10-CM

## 2021-09-08 DIAGNOSIS — M62830 Muscle spasm of back: Secondary | ICD-10-CM | POA: Diagnosis not present

## 2021-09-08 NOTE — Therapy (Signed)
?[facility] [address] ?[place], [place], [address] ?Phone: [phone]   Fax:  [phone] ? ?Physical Therapy Treatment ? ?Patient Details  ?Name: Nicholas Rasmussen ?MRN: [id number] ?Date of Birth: 1946/04/28 ?Referring Provider (PT): Nche ? ? ?Encounter Date: 09/08/2021 ? ? PT End of Session - 09/08/21 0803   ? ? Visit Number 19   ? Number of Visits 13   ? Date for PT Re-Evaluation 09/06/21   ? Authorization Type [employer]   ? Authorization Time Period 06/23/21 to 08/04/21   ? Progress Note Due on Visit 10   ? PT Start Time 0800   ? PT Stop Time [phone]   ? PT Time Calculation (min) 43 min   ? Activity Tolerance Patient tolerated treatment well   ? Behavior During Therapy [facility] for tasks assessed/performed   ? ?  ?  ? ?  ? ? ?Past Medical History:  ?Diagnosis Date  ? Allergic rhinitis 05/07/2014  ? Dizziness 01/02/2016  ? GERD (gastroesophageal reflux disease)   ? Headache 03/13/2015  ? Left flank pain 01/02/2016  ? Low back pain 06/06/2015  ? Pain of left side of body 08/13/2015  ? Post herpetic neuralgia   ? Routine general medical examination at a health care facility 05/07/2014  ? Seizure disorder ([place]) 05/07/2014  ? Seizures ([place])   ? Last seizure in the 70s.  ? Shingles   ? Stye 05/07/2014  ? ? ?Past Surgical History:  ?Procedure Laterality Date  ? COLONOSCOPY    ? KNEE SURGERY Left   ? left miniscus  ? UPPER GASTROINTESTINAL ENDOSCOPY    ? uvala surgery    ? ? ?There were no vitals filed for this visit. ? ? Subjective Assessment - 09/08/21 0801   ? ? Subjective I'm good, my stomach is sore.   ? Currently in Pain? Yes   ? Pain Score 4    ? Pain Location Back   ? Pain Orientation Lower   ? Pain Descriptors / Indicators Sore;Aching   ? Pain Type Chronic pain   ? ?  ?  ? ?  ? ? ? ? ? ? ? ? ? ? ? ? ? ? ? ? ? ? ? ? [facility] Adult PT Treatment/Exercise - 09/08/21 0001   ? ?  ? Lumbar Exercises: Aerobic  ? Elliptical Lvl 6 x 6 mins   ?  ? Lumbar Exercises: Seated  ?  Other Seated Lumbar Exercises quadraped  30 secs 3 sets   1 set UE only, 2 set LE only, 3rd both UE and LE  ? Other Seated Lumbar Exercises planks 30 secs x3   ?  ? Knee/Hip Exercises: Machines for Strengthening  ? Other Machine rows 2x10 65#; lats 2x10 55#; shoulder ext 2x10 20# B UE; trunk rotation 2x10 Both sides #15   ?  ? Knee/Hip Exercises: Standing  ? Other Standing Knee Exercises Trunk rotation with blue weight ball 30 secs x 3   ? ?  ?  ? ?  ? ? ? ? ? ? ? ? ? ? ? ? PT Short Term Goals - 08/13/21 1510   ? ?  ? PT SHORT TERM GOAL #1  ? Title Will be independent with appropriate HEP to be progressed PRN   ? Status Achieved   ?  ? PT SHORT TERM GOAL #2  ? Title Pain to be no more than 2/10 in order to improve QOL and sleep   ?  Status Partially Met   ?  ? PT SHORT TERM GOAL #3  ? Title Will be able to maintain upright posture with good core activation at least 75% of the time to help in reducing low back and thoracic/cervical pain   ? Status Achieved   ?  ? PT SHORT TERM GOAL #4  ? Title Lumbar ROM to be no more than 20% limited and  hamstring/piriformis/hip flexor flexibility to have improved by 50% to improve general mobility   ? Status Achieved   ? ?  ?  ? ?  ? ? ? ? PT Long Term Goals - 09/03/21 1552   ? ?  ? PT LONG TERM GOAL #1  ? Title MMT to have improved by at least 1 grade in all weak groups to help reduce pain   ? Status Partially Met   ?  ? PT LONG TERM GOAL #2  ? Status Achieved   ?  ? PT LONG TERM GOAL #3  ? Title Will demonstrate functionally correct lifting and general biomechanics without increase in pain to help prevent recurrence of symptoms   ? Status Partially Met   ?  ? PT LONG TERM GOAL #4  ? Title FOTO score to have improved by at least 10 points to show general improvement in mobility and function   ? Status Partially Met   ? ?  ?  ? ?  ? ? ? ? ? ? ? ? Plan - 09/08/21 0839   ? ? Clinical Impression Statement Patient came in sore in the core. Patient was compliant with increased weight  during exercises. Had some instability with quadriped but was able to recover and did well afterwards. VC's needed for correct posture during quadriped.   ? Personal Factors and Comorbidities Behavior Pattern;Past/Current Experience;Time since onset of injury/illness/exacerbation   ? Examination-Activity Limitations Locomotion Level;Transfers;Bed Mobility;Sit;Sleep;Carry;Squat;Stairs;Stand;Lift   ? Examination-Participation Restrictions Cleaning;Community Activity;Driving;Shop;Yard Work   ? Stability/Clinical Decision Making Stable/Uncomplicated   ? Clinical Decision Making Low   ? Rehab Potential Good   ? PT Frequency 1x / week   ? PT Duration 6 weeks   ? PT Treatment/Interventions ADLs/Self Care Home Management;Cryotherapy;Electrical Stimulation;Iontophoresis 69m/ml Dexamethasone;Moist Heat;Ultrasound;Gait training;Stair training;Functional mobility training;Therapeutic activities;Therapeutic exercise;Balance training;Neuromuscular re-education;Patient/family education;Manual techniques;Passive range of motion;Dry needling;Energy conservation;Taping;Visual/perceptual remediation/compensation   ? PT Next Visit Plan Progress as tolerated   ? PT [place]  ? Consulted and Agree with Plan of Care Patient   ? ?  ?  ? ?  ? ? ?Patient will benefit from skilled therapeutic intervention in order to improve the following deficits and impairments:  Decreased range of motion, Increased fascial restricitons, Difficulty walking, Increased muscle spasms, Decreased activity tolerance, Pain, Hypomobility, Impaired flexibility, Improper body mechanics, Decreased strength, Postural dysfunction ? ?Visit Diagnosis: ?Chronic midline low back pain without sciatica ? ?Muscle weakness (generalized) ? ? ? ? ?Problem List ?Patient Active Problem List  ? Diagnosis Date Noted  ? Hyperlipidemia associated with type 2 diabetes mellitus ([place] 08/19/2021  ? Yeast infection 07/03/2021  ? Shortness of breath 06/05/2021  ? Breast  pain 06/05/2021  ? Atypical chest pain 06/05/2021  ? History of COVID-19 06/05/2021  ? [name] 04/07/2021  ? Complex renal cyst 04/07/2021  ? Chronic right-sided low back pain without sciatica 03/31/2020  ? Varicose veins of both lower extremities with pain 02/02/2019  ? Encounter for therapeutic drug level monitoring 09/19/2018  ? Type 2 diabetes mellitus with  diabetic polyneuropathy, without long-term current use of insulin ([place]) 03/29/2018  ? Diabetic neuropathy ([place]) 03/29/2018  ? Chronic pain of left knee 03/23/2018  ? CAD (coronary artery disease) 08/03/2017  ? Post herpetic neuralgia 07/29/2016  ? Dizziness 01/02/2016  ? Chronic midline low back pain without sciatica 06/06/2015  ? GERD (gastroesophageal reflux disease) 11/15/2014  ? Seizure disorder ([place]) 05/07/2014  ? Allergic rhinitis 05/07/2014  ? ? ?Sophia Chauncey Cruel ?09/08/2021, 8:47 AM ? ?[facility] ?[address] ?[place], [place], [address] ?Phone: [phone]   Fax:  [phone] ? ?Name: Breton Berns ?MRN: [id number] ?Date of Birth: [birth year] ? ? ? ?

## 2021-09-08 NOTE — Progress Notes (Addendum)
? ? ?  Chronic Care Management ?Pharmacy Assistant  ? ?Name: Nicholas Rasmussen  MRN: [id number] DOB: Jun 03, 1945 ? ?Reason for Encounter: Medication Review/Patient assistance application for Jardiance. ?  ?Recent office visits:  ?No recent office visit ? ?Recent consult visits:  ?No recent consult visit ? ?Hospital visits:  ?None in previous 6 months ? ?Medications: ?Outpatient Encounter Medications as of 09/08/2021  ?Medication Sig  ? acetaminophen (TYLENOL) 500 MG tablet Take 1 tablet (500 mg total) by mouth every 8 (eight) hours as needed.  ? acyclovir (ZOVIRAX) 200 MG capsule Take 1 capsule (200 mg total) by mouth 5 (five) times daily.  ? atorvastatin (LIPITOR) 20 MG tablet Take 1 tablet (20 mg total) by mouth daily at 6 PM.  ? clotrimazole (LOTRIMIN) 1 % cream APPLY TO AFFECTED AREA TWICE A DAY  ? diclofenac Sodium (VOLTAREN) 1 % GEL Apply a small grape sized dollop to sore joint up to every 6 hours as needed  ? divalproex (DEPAKOTE ER) 500 MG 24 hr tablet Take 1 tablet (500 mg total) by mouth at bedtime.  ? empagliflozin (JARDIANCE) 10 MG TABS tablet Take 1 tablet (10 mg total) by mouth daily.  ? gabapentin (NEURONTIN) 300 MG capsule TAKE 1 CAPSULE BY MOUTH EVERYDAY AT BEDTIME  ? ibuprofen (ADVIL) 800 MG tablet Take 1 tablet (800 mg total) by mouth 3 (three) times daily.  ? meclizine (ANTIVERT) 25 MG tablet Take 1 tablet (25 mg total) by mouth 3 (three) times daily as needed.  ? metoprolol succinate (TOPROL-XL) 25 MG 24 hr tablet Take 1 tablet (25 mg total) by mouth daily.  ? omeprazole (PRILOSEC) 20 MG capsule TAKE 1 CAPSULE BY MOUTH EVERY DAY AS NEEDED  ? ?No facility-administered encounter medications on file as of 09/08/2021.  ? ? ?Care Gaps: ?None ID  ? ?Star Rating Drugs: ?Jardiance 10 mg last [facility]  ?Atorvastatin 20 mg last filled on 08/23/2021 [facility] ? ?Medication Fill Gaps: ?None  ? ?I received a task from [name], CPP  requesting that I start the application for patient assistance on the medication Jardiance.  ?  ?Clinical pharmacist spoke with the patient and he  requested that the application mailed. The application will need to be faxed to [phone] and the phone number that can be called to check the status of the application will be [phone]. Patient confirm his physical address that is scan in his chart from his Driver's License, and requested his application be mailed to his [address] address.Patient is aware to return his application once he completes his part to his PCP office for the Clinical pharmacist to fax over for processing. ?  ?Application emailed to [name], CPP for review and to Mail to patient. ? ?Anderson Malta ?Clinical Pharmacist Assistant ?[phone]  ? ? ? ?

## 2021-09-10 ENCOUNTER — Encounter: Payer: Self-pay | Admitting: Physical Therapy

## 2021-09-10 ENCOUNTER — Ambulatory Visit: Payer: PPO | Admitting: Physical Therapy

## 2021-09-10 DIAGNOSIS — M62830 Muscle spasm of back: Secondary | ICD-10-CM | POA: Diagnosis not present

## 2021-09-10 DIAGNOSIS — M545 Low back pain, unspecified: Secondary | ICD-10-CM

## 2021-09-10 DIAGNOSIS — M6281 Muscle weakness (generalized): Secondary | ICD-10-CM

## 2021-09-10 NOTE — Therapy (Addendum)
[place]. [place], [place], [address] Phone: [phone]   Fax:  [phone] Progress Note Reporting Period 08/04/21 to 09/10/21 for visits 11-20  See note below for Objective Data and Assessment of Progress/Goals.     Physical Therapy Treatment  Patient Details  Name: Demani Mcbrien MRN: [id number] Date of Birth: 11/29/1945 Referring Provider (PT): Nche   Encounter Date: 09/10/2021   PT End of Session - 09/10/21 1015     Visit Number 20    Number of Visits 13    Date for PT Re-Evaluation 09/06/21    Authorization Type [employer]    Authorization Time Period 06/23/21 to 08/04/21    Progress Note Due on Visit 10    PT Start Time 0929    PT Stop Time 1014    PT Time Calculation (min) 45 min    Activity Tolerance Patient tolerated treatment well    Behavior During Therapy [facility] for tasks assessed/performed             Past Medical History:  Diagnosis Date   Allergic rhinitis 05/07/2014   Dizziness 01/02/2016   GERD (gastroesophageal reflux disease)    Headache 03/13/2015   Left flank pain 01/02/2016   Low back pain 06/06/2015   Pain of left side of body 08/13/2015   Post herpetic neuralgia    Routine general medical examination at a health care facility 05/07/2014   Seizure disorder ([place]) 05/07/2014   Seizures ([place])    Last seizure in the 70s.   Shingles    Stye 05/07/2014    Past Surgical History:  Procedure Laterality Date   COLONOSCOPY     KNEE SURGERY Left    left miniscus   UPPER GASTROINTESTINAL ENDOSCOPY     uvala surgery      There were no vitals filed for this visit.   Subjective Assessment - 09/10/21 0930     Subjective I'm doing good, the last session wore me out.    Currently in Pain? Yes    Pain Score 3     Pain Location Back    Pain Orientation Lower    Pain Descriptors / Indicators Sore;Aching                               [facility] Adult PT  Treatment/Exercise - 09/10/21 0001       Lumbar Exercises: Stretches   Single Knee to Chest Stretch Right;Left;1 rep;20 seconds    Double Knee to Chest Stretch 2 reps;20 seconds      Lumbar Exercises: Aerobic   Elliptical lvl 6 x6 mins; 3 mins fwd and 3 mins backwards      Lumbar Exercises: Machines for Strengthening   Other Lumbar Machine Exercise lat pull down x10 reps #65      Lumbar Exercises: Standing   Row AROM;Strengthening;Both    Row Limitations 20#    Shoulder Extension AROM;Strengthening;Both;10 reps    Shoulder Extension Limitations 20#    Other Standing Lumbar Exercises trunk rotation w/ 10# both sides    Other Standing Lumbar Exercises marches w/ overhead 10# 2x10      Lumbar Exercises: Prone   Other Prone Lumbar Exercises 20 secs x3 planks on BOSU ball                       PT Short Term Goals - 09/10/21 1002  PT SHORT TERM GOAL #2   Title Pain to be no more than 2/10 in order to improve QOL and sleep    Time 3    Status Partially Met   pain 3/10              PT Long Term Goals - 09/10/21 1004       PT LONG TERM GOAL #1   Title MMT to have improved by at least 1 grade in all weak groups to help reduce pain    Time 6    Period Weeks    Status Partially Met      PT LONG TERM GOAL #3   Title Will demonstrate functionally correct lifting and general biomechanics without increase in pain to help prevent recurrence of symptoms    Status Achieved                   Plan - 09/10/21 1016     Clinical Impression Statement Patient came in feeling good. He was skeptical about planks on the BOSU ball but did well afterwards. VC's needed for correct posture during standing marches w/ weight and standing rows.    Personal Factors and Comorbidities Behavior Pattern;Past/Current Experience;Time since onset of injury/illness/exacerbation    Examination-Activity Limitations Locomotion Level;Transfers;Bed  Mobility;Sit;Sleep;Carry;Squat;Stairs;Stand;Lift    Examination-Participation Restrictions Cleaning;Community Activity;Driving;Shop;Yard Work    Stability/Clinical Decision Making Stable/Uncomplicated    Clinical Decision Making Low    Rehab Potential Good    PT Frequency 1x / week    PT Duration 6 weeks    PT Treatment/Interventions ADLs/Self Care Home Management;Cryotherapy;Electrical Stimulation;Iontophoresis 33m/ml Dexamethasone;Moist Heat;Ultrasound;Gait training;Stair training;Functional mobility training;Therapeutic activities;Therapeutic exercise;Balance training;Neuromuscular re-education;Patient/family education;Manual techniques;Passive range of motion;Dry needling;Energy conservation;Taping;Visual/perceptual remediation/compensation    PT Next Visit Plan Progress as tolerated    PT Home Exercise Plan [email or web address]    Consulted and Agree with Plan of Care Patient             Patient will benefit from skilled therapeutic intervention in order to improve the following deficits and impairments:  Decreased range of motion, Increased fascial restricitons, Difficulty walking, Increased muscle spasms, Decreased activity tolerance, Pain, Hypomobility, Impaired flexibility, Improper body mechanics, Decreased strength, Postural dysfunction  Visit Diagnosis: Chronic midline low back pain without sciatica  Muscle weakness (generalized)  Muscle spasm of back     Problem List Patient Active Problem List   Diagnosis Date Noted   Hyperlipidemia associated with type 2 diabetes mellitus ([place] 08/19/2021   Yeast infection 07/03/2021   Shortness of breath 06/05/2021   Breast pain 06/05/2021   Atypical chest pain 06/05/2021   History of COVID-19 06/05/2021   [name] 04/07/2021   Complex renal cyst 04/07/2021   Chronic right-sided low back pain without sciatica 03/31/2020   Varicose veins of both lower extremities with pain 02/02/2019   Encounter  for therapeutic drug level monitoring 09/19/2018   Type 2 diabetes mellitus with diabetic polyneuropathy, without long-term current use of insulin ([place] 03/29/2018   Diabetic neuropathy ([place] 03/29/2018   Chronic pain of left knee 03/23/2018   CAD (coronary artery disease) 08/03/2017   Post herpetic neuralgia 07/29/2016   Dizziness 01/02/2016   Chronic midline low back pain without sciatica 06/06/2015   GERD (gastroesophageal reflux disease) 11/15/2014   Seizure disorder ([facility] 05/07/2014   Allergic rhinitis 05/07/2014    Evangaline Jou PChauncey Cruel4/20/2023, 10:19 AM  [place] [id number]:  [phone]   Fax:  [phone]  Name: Damonie Ellenwood MRN: [id number] Date of Birth: [birth year]

## 2021-09-15 ENCOUNTER — Ambulatory Visit: Payer: PPO | Admitting: Physical Therapy

## 2021-09-15 ENCOUNTER — Encounter: Payer: Self-pay | Admitting: Physical Therapy

## 2021-09-15 DIAGNOSIS — M62830 Muscle spasm of back: Secondary | ICD-10-CM | POA: Diagnosis not present

## 2021-09-15 DIAGNOSIS — M79605 Pain in left leg: Secondary | ICD-10-CM

## 2021-09-15 DIAGNOSIS — M545 Low back pain, unspecified: Secondary | ICD-10-CM

## 2021-09-15 DIAGNOSIS — M6281 Muscle weakness (generalized): Secondary | ICD-10-CM

## 2021-09-15 NOTE — Therapy (Signed)
[place] ?[place] ?[address] ?[place], [place], [address] ?Phone: [phone]   Fax:  [phone] ? ?Physical Therapy Treatment ? ?Patient Details  ?Name: Nicholas Rasmussen ?MRN: [id number] ?Date of Birth: 08-03-1945 ?Referring Provider (PT): Nche ? ? ?Encounter Date: 09/15/2021 ? ? PT End of Session - 09/15/21 1105   ? ? Visit Number 21   ? Number of Visits 13   ? Date for PT Re-Evaluation 09/06/21   ? Authorization Type [employer]   ? Authorization Time Period 06/23/21 to 08/04/21   ? Progress Note Due on Visit 10   ? PT Start Time 1015   ? PT Stop Time 1058   ? PT Time Calculation (min) 43 min   ? Activity Tolerance Patient tolerated treatment well   ? Behavior During Therapy [facility] for tasks assessed/performed   ? ?  ?  ? ?  ? ? ?Past Medical History:  ?Diagnosis Date  ? Allergic rhinitis 05/07/2014  ? Dizziness 01/02/2016  ? GERD (gastroesophageal reflux disease)   ? Headache 03/13/2015  ? Left flank pain 01/02/2016  ? Low back pain 06/06/2015  ? Pain of left side of body 08/13/2015  ? Post herpetic neuralgia   ? Routine general medical examination at a health care facility 05/07/2014  ? Seizure disorder ([place]) 05/07/2014  ? Seizures ([place])   ? Last seizure in the 70s.  ? Shingles   ? Stye 05/07/2014  ? ? ?Past Surgical History:  ?Procedure Laterality Date  ? COLONOSCOPY    ? KNEE SURGERY Left   ? left miniscus  ? UPPER GASTROINTESTINAL ENDOSCOPY    ? uvala surgery    ? ? ?There were no vitals filed for this visit. ? ? Subjective Assessment - 09/15/21 1019   ? ? Subjective I'm doing good   ? Patient Stated Goals get ready for travel, just get feeling better- doing a lot of travelling/driving at the end of March   ? Currently in Pain? Yes   ? Pain Score 4    ? Pain Location Back   ? Pain Orientation Lower   ? Pain Descriptors / Indicators Sore;Aching   ? ?  ?  ? ?  ? ? ? ? ? ? ? ? ? ? ? ? ? ? ? ? ? ? ? ? [place] Adult PT Treatment/Exercise - 09/15/21 0001   ? ?  ?  Lumbar Exercises: Stretches  ? Double Knee to Chest Stretch 2 reps;30 seconds   ? Lower Trunk Rotation 2 reps;20 seconds   ? Lower Trunk Rotation Limitations both sides   ?  ? Lumbar Exercises: Aerobic  ? Elliptical lvl 5 x 6 mins, 3 mins fwd and 3 mins backward   ?  ? Lumbar Exercises: Machines for Strengthening  ? Other Lumbar Machine Exercise trunk rotation both sides 2x10 15#; rows x10 65#   ? Other Lumbar Machine Exercise lat pulls 2x10 65#   ?  ? Lumbar Exercises: Standing  ? Other Standing Lumbar Exercises kettle bell swings x10 5lb   ? Other Standing Lumbar Exercises marches w/ overhead 10# 2x10 each leg   ?  ? Lumbar Exercises: Seated  ? Other Seated Lumbar Exercises STS w/ chest press 10# 2x10   ?  ? Lumbar Exercises: Prone  ? Other Prone Lumbar Exercises commando's x10   ? ?  ?  ? ?  ? ? ? ? ? ? ? ? ? ? ? ? PT Short Term Goals -  09/10/21 1002   ? ?  ? PT SHORT TERM GOAL #2  ? Title Pain to be no more than 2/10 in order to improve QOL and sleep   ? Time 3   ? Status Partially Met   pain 3/10  ? ?  ?  ? ?  ? ? ? ? PT Long Term Goals - 09/10/21 1004   ? ?  ? PT LONG TERM GOAL #1  ? Title MMT to have improved by at least 1 grade in all weak groups to help reduce pain   ? Time 6   ? Period Weeks   ? Status Partially Met   ?  ? PT LONG TERM GOAL #3  ? Title Will demonstrate functionally correct lifting and general biomechanics without increase in pain to help prevent recurrence of symptoms   ? Status Achieved   ? ?  ?  ? ?  ? ? ? ? ? ? ? ? Plan - 09/15/21 1107   ? ? Clinical Impression Statement Patient came in feeling good, he was sore after last session. He did well overall. VC's needed for correct posture during lat pull downs, kettle bell swings, and commandos. Educated patient about his standing and sitting posture.   ? Personal Factors and Comorbidities Behavior Pattern;Past/Current Experience;Time since onset of injury/illness/exacerbation   ? Examination-Activity Limitations Locomotion  Level;Transfers;Bed Mobility;Sit;Sleep;Carry;Squat;Stairs;Stand;Lift   ? Examination-Participation Restrictions Cleaning;Community Activity;Driving;Shop;Yard Work   ? Stability/Clinical Decision Making Stable/Uncomplicated   ? Clinical Decision Making Low   ? Rehab Potential Good   ? PT Frequency 1x / week   ? PT Duration 6 weeks   ? PT Treatment/Interventions ADLs/Self Care Home Management;Cryotherapy;Electrical Stimulation;Iontophoresis 51m/ml Dexamethasone;Moist Heat;Ultrasound;Gait training;Stair training;Functional mobility training;Therapeutic activities;Therapeutic exercise;Balance training;Neuromuscular re-education;Patient/family education;Manual techniques;Passive range of motion;Dry needling;Energy conservation;Taping;Visual/perceptual remediation/compensation   ? PT Next Visit Plan discharge next visit   ? PT [facility]  ? Consulted and Agree with Plan of Care Patient   ? ?  ?  ? ?  ? ? ?Patient will benefit from skilled therapeutic intervention in order to improve the following deficits and impairments:  Decreased range of motion, Increased fascial restricitons, Difficulty walking, Increased muscle spasms, Decreased activity tolerance, Pain, Hypomobility, Impaired flexibility, Improper body mechanics, Decreased strength, Postural dysfunction ? ?Visit Diagnosis: ?Muscle weakness (generalized) ? ?Chronic midline low back pain without sciatica ? ?Pain in left leg ? ? ? ? ?Problem List ?Patient Active Problem List  ? Diagnosis Date Noted  ? Hyperlipidemia associated with type 2 diabetes mellitus ([place] 08/19/2021  ? Yeast infection 07/03/2021  ? Shortness of breath 06/05/2021  ? Breast pain 06/05/2021  ? Atypical chest pain 06/05/2021  ? History of COVID-19 06/05/2021  ? [name] 04/07/2021  ? Complex renal cyst 04/07/2021  ? Chronic right-sided low back pain without sciatica 03/31/2020  ? Varicose veins of both lower extremities with pain 02/02/2019   ? Encounter for therapeutic drug level monitoring 09/19/2018  ? Type 2 diabetes mellitus with diabetic polyneuropathy, without long-term current use of insulin ([place] 03/29/2018  ? Diabetic neuropathy ([facility] 03/29/2018  ? Chronic pain of left knee 03/23/2018  ? CAD (coronary artery disease) 08/03/2017  ? Post herpetic neuralgia 07/29/2016  ? Dizziness 01/02/2016  ? Chronic midline low back pain without sciatica 06/06/2015  ? GERD (gastroesophageal reflux disease) 11/15/2014  ? Seizure disorder ([place] 05/07/2014  ? Allergic rhinitis 05/07/2014  ? ? ?Sabirin Baray PChauncey Cruel?09/15/2021, 11:12 AM ? ?[place] ?  [place] ?[address] ?[place], [place], [address] ?Phone: [phone]   Fax:  [phone] ? ?Name: Nicholas Rasmussen ?MRN: [id number] ?Date of Birth: 03-Jan-1946 ? ? ? ?

## 2021-09-17 ENCOUNTER — Encounter: Payer: Self-pay | Admitting: Physical Therapy

## 2021-09-17 ENCOUNTER — Ambulatory Visit: Payer: PPO | Admitting: Physical Therapy

## 2021-09-17 DIAGNOSIS — M6281 Muscle weakness (generalized): Secondary | ICD-10-CM

## 2021-09-17 DIAGNOSIS — M545 Low back pain, unspecified: Secondary | ICD-10-CM

## 2021-09-17 DIAGNOSIS — M62830 Muscle spasm of back: Secondary | ICD-10-CM | POA: Diagnosis not present

## 2021-09-17 DIAGNOSIS — M79605 Pain in left leg: Secondary | ICD-10-CM

## 2021-09-17 NOTE — Therapy (Signed)
[place] ?[place] ?[address] ?[place], [place], [address] ?Phone: [phone]   Fax:  [phone] ? ?Physical Therapy Treatment ? ?Patient Details  ?Name: Nicholas Rasmussen ?MRN: [id number] ?Date of Birth: 07-20-1945 ?Referring Provider (PT): Nche ? ? ?Encounter Date: 09/17/2021 ? ? PT End of Session - 09/17/21 1422   ? ? Visit Number 22   ? Date for PT Re-Evaluation 09/06/21   ? Authorization Type [employer]   ? PT Start Time 1345   ? PT Stop Time 1031   ? PT Time Calculation (min) 44 min   ? Activity Tolerance Patient tolerated treatment well   ? Behavior During Therapy [facility] for tasks assessed/performed   ? ?  ?  ? ?  ? ? ?Past Medical History:  ?Diagnosis Date  ? Allergic rhinitis 05/07/2014  ? Dizziness 01/02/2016  ? GERD (gastroesophageal reflux disease)   ? Headache 03/13/2015  ? Left flank pain 01/02/2016  ? Low back pain 06/06/2015  ? Pain of left side of body 08/13/2015  ? Post herpetic neuralgia   ? Routine general medical examination at a health care facility 05/07/2014  ? Seizure disorder ([place]) 05/07/2014  ? Seizures ([place])   ? Last seizure in the 70s.  ? Shingles   ? Stye 05/07/2014  ? ? ?Past Surgical History:  ?Procedure Laterality Date  ? COLONOSCOPY    ? KNEE SURGERY Left   ? left miniscus  ? UPPER GASTROINTESTINAL ENDOSCOPY    ? uvala surgery    ? ? ?There were no vitals filed for this visit. ? ? Subjective Assessment - 09/17/21 1341   ? ? Subjective Im ok   ? Currently in Pain? Yes   ? Pain Score 4    ? Pain Location Back   ? Pain Orientation Right   ? ?  ?  ? ?  ? ? ? ? ? ? ? ? ? ? ? ? ? ? ? ? ? ? ? ? [place] Adult PT Treatment/Exercise - 09/17/21 0001   ? ?  ? Lumbar Exercises: Aerobic  ? Elliptical lvl 5 x 6 mins, 3 mins fwd and 3 mins backward   ?  ? Lumbar Exercises: Machines for Strengthening  ? Cybex Lumbar Extension black tband 2 sets 15   ? Leg Press 90lb 3x15   ? Other Lumbar Machine Exercise Rows & Lats 65lb 2x10   ?  ? Lumbar Exercises:  Standing  ? Row Strengthening;Power tower;20 reps   athletic stnce  ? Row Limitations 65   ? Other Standing Lumbar Exercises AR OHP on airex x10, AR press to OHP on airex x5 each 25lb   ?  ? Lumbar Exercises: Seated  ? Other Seated Lumbar Exercises STS w/ OHP 20# x10   ?  ? Lumbar Exercises: Prone  ? Other Prone Lumbar Exercises knee to elbow elevated position x10   ?  ? Knee/Hip Exercises: Standing  ? Forward Step Up Both;2 sets;10 reps;Hand Hold: 0;Step Height: 8"   9lb dumbbell in opposite hand, box on airex  ? ?  ?  ? ?  ? ? ? ? ? ? ? ? ? ? ? ? PT Short Term Goals - 09/10/21 1002   ? ?  ? PT SHORT TERM GOAL #2  ? Title Pain to be no more than 2/10 in order to improve QOL and sleep   ? Time 3   ? Status Partially Met   pain 3/10  ? ?  ?  ? ?  ? ? ? ?  PT Long Term Goals - 09/17/21 1424   ? ?  ? PT LONG TERM GOAL #1  ? Title MMT to have improved by at least 1 grade in all weak groups to help reduce pain   ? Status Achieved   ?  ? PT LONG TERM GOAL #2  ? Title Will be able to tolerate driving for at least 2 hours before needing to take a break without increase in pain so that he can travel to see family more easily   ? Status Achieved   ?  ? PT LONG TERM GOAL #3  ? Title Will demonstrate functionally correct lifting and general biomechanics without increase in pain to help prevent recurrence of symptoms   ? Status Achieved   ?  ? PT LONG TERM GOAL #4  ? Title FOTO score to have improved by at least 10 points to show general improvement in mobility and function   ? Status Achieved   ? ?  ?  ? ?  ? ? ? ? ? ? ? ? Plan - 09/17/21 1424   ? ? Clinical Impression Statement Pt has progressed meeting all LTG. He did well with all interventions. Some core weakness present with Anti rotation interventions. Weights on leg press was less for additional reps. Postural cues needed int the athletic stance with standing rows. Pt reports no functional limitations.   ? Personal Factors and Comorbidities Behavior Pattern;Past/Current  Experience;Time since onset of injury/illness/exacerbation   ? Examination-Activity Limitations Locomotion Level;Transfers;Bed Mobility;Sit;Sleep;Carry;Squat;Stairs;Stand;Lift   ? Examination-Participation Restrictions Cleaning;Community Activity;Driving;Shop;Yard Work   ? Rehab Potential Good   ? PT Frequency 1x / week   ? PT Duration 6 weeks   ? PT Treatment/Interventions ADLs/Self Care Home Management;Cryotherapy;Electrical Stimulation;Iontophoresis 4mg /ml Dexamethasone;Moist Heat;Ultrasound;Gait training;Stair training;Functional mobility training;Therapeutic activities;Therapeutic exercise;Balance training;Neuromuscular re-education;Patient/family education;Manual techniques;Passive range of motion;Dry needling;Energy conservation;Taping;Visual/perceptual remediation/compensation   ? PT Next Visit Plan D/C PT   ? ?  ?  ? ?  ? ? ?Patient will benefit from skilled therapeutic intervention in order to improve the following deficits and impairments:  Decreased range of motion, Increased fascial restricitons, Difficulty walking, Increased muscle spasms, Decreased activity tolerance, Pain, Hypomobility, Impaired flexibility, Improper body mechanics, Decreased strength, Postural dysfunction ? ?Visit Diagnosis: ?Muscle weakness (generalized) ? ?Chronic midline low back pain without sciatica ? ?Pain in left leg ? ? ? ? ?Problem List ?Patient Active Problem List  ? Diagnosis Date Noted  ? Hyperlipidemia associated with type 2 diabetes mellitus ([place]) 08/19/2021  ? Yeast infection 07/03/2021  ? Shortness of breath 06/05/2021  ? Breast pain 06/05/2021  ? Atypical chest pain 06/05/2021  ? History of COVID-19 06/05/2021  ? [name] 04/07/2021  ? Complex renal cyst 04/07/2021  ? Chronic right-sided low back pain without sciatica 03/31/2020  ? Varicose veins of both lower extremities with pain 02/02/2019  ? Encounter for therapeutic drug level monitoring 09/19/2018  ? Type 2 diabetes  mellitus with diabetic polyneuropathy, without long-term current use of insulin ([place]) 03/29/2018  ? Diabetic neuropathy ([place]) 03/29/2018  ? Chronic pain of left knee 03/23/2018  ? CAD (coronary artery disease) 08/03/2017  ? Post herpetic neuralgia 07/29/2016  ? Dizziness 01/02/2016  ? Chronic midline low back pain without sciatica 06/06/2015  ? GERD (gastroesophageal reflux disease) 11/15/2014  ? Seizure disorder ([name]) 05/07/2014  ? Allergic rhinitis 05/07/2014  ? ?PHYSICAL THERAPY DISCHARGE SUMMARY ? ?Visits from Start of Care: 22 ? ?Patient agrees to discharge. Patient goals were met.  Patient is being discharged due to meeting the stated rehab goals. ? ? ?Scot Jun, PTA ?09/17/2021, 2:29 PM ? ?[place] ?[place] ?[address] ?[place], [place], [address] ?Phone: [phone]   Fax:  [phone] ? ?Name: Nicholas Rasmussen ?MRN: [id number] ?Date of Birth: 1945-09-19 ? ? ? ?

## 2021-09-19 ENCOUNTER — Other Ambulatory Visit: Payer: Self-pay | Admitting: Nurse Practitioner

## 2021-09-20 DIAGNOSIS — E1169 Type 2 diabetes mellitus with other specified complication: Secondary | ICD-10-CM

## 2021-09-20 DIAGNOSIS — E1142 Type 2 diabetes mellitus with diabetic polyneuropathy: Secondary | ICD-10-CM

## 2021-09-20 DIAGNOSIS — E785 Hyperlipidemia, unspecified: Secondary | ICD-10-CM | POA: Diagnosis not present

## 2021-10-06 ENCOUNTER — Ambulatory Visit (INDEPENDENT_AMBULATORY_CARE_PROVIDER_SITE_OTHER): Payer: PPO | Admitting: Nurse Practitioner

## 2021-10-06 ENCOUNTER — Encounter: Payer: Self-pay | Admitting: Nurse Practitioner

## 2021-10-06 VITALS — BP 124/78 | HR 70 | Temp 97.0°F | Wt 243.0 lb

## 2021-10-06 DIAGNOSIS — E1142 Type 2 diabetes mellitus with diabetic polyneuropathy: Secondary | ICD-10-CM | POA: Diagnosis not present

## 2021-10-06 DIAGNOSIS — G40909 Epilepsy, unspecified, not intractable, without status epilepticus: Secondary | ICD-10-CM | POA: Diagnosis not present

## 2021-10-06 DIAGNOSIS — R918 Other nonspecific abnormal finding of lung field: Secondary | ICD-10-CM | POA: Diagnosis not present

## 2021-10-06 DIAGNOSIS — R519 Headache, unspecified: Secondary | ICD-10-CM | POA: Diagnosis not present

## 2021-10-06 LAB — RENAL FUNCTION PANEL
Albumin: 4.1 g/dL (ref 3.5–5.2)
BUN: 18 mg/dL (ref 6–23)
CO2: 32 mEq/L (ref 19–32)
Calcium: 9.3 mg/dL (ref 8.4–10.5)
Chloride: 103 mEq/L (ref 96–112)
Creatinine, Ser: 1.25 mg/dL (ref 0.40–1.50)
GFR: 57.11 mL/min — ABNORMAL LOW (ref >60.00)
Glucose, Bld: 106 mg/dL — ABNORMAL HIGH (ref 70–99)
Phosphorus: 3.7 mg/dL (ref 2.3–4.6)
Potassium: 4 mEq/L (ref 3.5–5.1)
Sodium: 140 mEq/L (ref 135–145)

## 2021-10-06 NOTE — Progress Notes (Signed)
? ?             Established Patient Visit ? ?Patient: Nicholas Rasmussen   DOB: [birth year]   76 y.o. Male  MRN: [id number] ?Visit Date: 10/06/2021 ? ?Subjective:  ?  ?Chief Complaint  ?Patient presents with  ? Follow-up  ?  6 month follow up for DM , HTN and hyperlipidemia. Patient is fasting and doesn't know what his blood sugars run daily. He states he hasn't been checking daily.   ? ?HPI ?Nonintractable episodic headache ?Chronic, onset 2017, intermittent, maybe once every other week. ?Location: Right forehead radiates to scalp and right ear,  ?Describes as mild, dull, associated with lightneadedness ?No syncope, no change in vision, numbness, no muscle weakness ?No specific trigger. ?Resolved with tylenol. ?Last head CT 2017: normal ? ?Continue use of tylenol prn. ? ?Type 2 diabetes mellitus with diabetic polyneuropathy, without long-term current use of insulin ([place]) ?Controlled with hgbA1c at 6.5% with the last 61month ?Neuropathy stable with gabapentin at hs. ?No adverse effects with jardiance. ? ?Repeat renal function: stable ?Maintain med dose ? ?Seizure disorder ?No seizure activity in several years. ?Current use of depakote daily. ?Under the care of neurology: annual appt. ? ?Reviewed medical, surgical, and social history today ? ?Medications: ?Outpatient Medications Prior to Visit  ?Medication Sig  ? acetaminophen (TYLENOL) 500 MG tablet Take 1 tablet (500 mg total) by mouth every 8 (eight) hours as needed.  ? acyclovir (ZOVIRAX) 200 MG capsule Take 1 capsule (200 mg total) by mouth 5 (five) times daily.  ? atorvastatin (LIPITOR) 20 MG tablet Take 1 tablet (20 mg total) by mouth daily at 6 PM.  ? clotrimazole (LOTRIMIN) 1 % cream APPLY TO AFFECTED AREA TWICE A DAY  ? diclofenac Sodium (VOLTAREN) 1 % GEL Apply a small grape sized dollop to sore joint up to every 6 hours as needed  ? divalproex (DEPAKOTE ER) 500 MG 24 hr tablet Take 1 tablet (500 mg total) by mouth at bedtime.  ? empagliflozin (JARDIANCE) 10 MG  TABS tablet Take 1 tablet (10 mg total) by mouth daily.  ? gabapentin (NEURONTIN) 300 MG capsule TAKE 1 CAPSULE BY MOUTH EVERYDAY AT BEDTIME  ? ibuprofen (ADVIL) 800 MG tablet Take 1 tablet (800 mg total) by mouth 3 (three) times daily.  ? meclizine (ANTIVERT) 25 MG tablet Take 1 tablet (25 mg total) by mouth 3 (three) times daily as needed.  ? metoprolol succinate (TOPROL-XL) 25 MG 24 hr tablet Take 1 tablet (25 mg total) by mouth daily.  ? omeprazole (PRILOSEC) 20 MG capsule TAKE 1 CAPSULE BY MOUTH EVERY DAY AS NEEDED  ? ?No facility-administered medications prior to visit.  ? ?Reviewed past medical and social history.  ? ?ROS per HPI above ? ? ?   ?Objective:  ?BP 124/78 (BP Location: Left Arm, Patient Position: Sitting, Cuff Size: Large)   Pulse 70   Temp (!) 97 ?F (36.1 ?C) (Temporal)   Wt 243 lb (110.2 kg)   SpO2 97%   BMI 29.58 kg/m?  ? ?  ? ?Physical Exam  ?Results for orders placed or performed in visit on 10/06/21  ?Renal Function Panel  ?Result Value Ref Range  ? Sodium 140 135 - 145 mEq/L  ? Potassium 4.0 3.5 - 5.1 mEq/L  ? Chloride 103 96 - 112 mEq/L  ? CO2 32 19 - 32 mEq/L  ? Albumin 4.1 3.5 - 5.2 g/dL  ? BUN 18 6 - 23 mg/dL  ? Creatinine, Ser 1.25 0.40 -  1.50 mg/dL  ? Glucose, Bld 106 (H) 70 - 99 mg/dL  ? Phosphorus 3.7 2.3 - 4.6 mg/dL  ? GFR 57.11 (L) >60.00 mL/min  ? Calcium 9.3 8.4 - 10.5 mg/dL  ? ?   ?Assessment & Plan:  ?  ?Problem List Items Addressed This Visit   ? ?  ? Respiratory  ? Multiple pulmonary nodules  ? Relevant Orders  ? CT Chest Wo Contrast  ?  ? Endocrine  ? Type 2 diabetes mellitus with diabetic polyneuropathy, without long-term current use of insulin ([place]) - Primary  ?  Controlled with hgbA1c at 6.5% with the last 85month ?Neuropathy stable with gabapentin at hs. ?No adverse effects with jardiance. ? ?Repeat renal function: stable ?Maintain med dose ? ?  ?  ? Relevant Orders  ? Referral to Nutrition and Diabetes Services  ? Renal Function Panel (Completed)  ?  ? Nervous and  Auditory  ? [place]  ?  No seizure activity in several years. ?Current use of depakote daily. ?Under the care of neurology: annual appt. ? ?  ?  ?  ? Other  ? Nonintractable episodic headache  ?  Chronic, onset 2017, intermittent, maybe once every other week. ?Location: Right forehead radiates to scalp and right ear,  ?Describes as mild, dull, associated with lightneadedness ?No syncope, no change in vision, numbness, no muscle weakness ?No specific trigger. ?Resolved with tylenol. ?Last head CT 2017: normal ? ?Continue use of tylenol prn. ?  ?  ? ?Return in about 6 months (around 04/08/2022) for DM and HTN, hyperlipidemia (fasting). ? ?  ? ?CWilfred Lacy NP ? ? ?

## 2021-10-06 NOTE — Assessment & Plan Note (Signed)
Controlled with hgbA1c at 6.5% with the last 46month ?Neuropathy stable with gabapentin at hs. ?No adverse effects with jardiance. ? ?Repeat renal function: stable ?Maintain med dose ?

## 2021-10-06 NOTE — Patient Instructions (Addendum)
Use tylenol as needed for headache. ?Keep log on headache frequency and possible trigger. ? ?You will be contacted to schedule appt for repeat CT chest in 11/2021 Need to re evaluate pulnomary nodules. ?Go to lab. ? ?Form - Headache Record ?There are many types and causes of headaches. A headache record can help guide your treatment plan. Use this form to record the details. Bring this form with you to your follow-up visits. ?Follow your health care provider's instructions on how to describe your headache. You may be asked to: ?Use a pain scale. This is a tool to rate the intensity of your headache using words or numbers. ?Describe what your headache feels like, such as dull, achy, throbbing, or sharp. ?Headache record ?Date: _______________ Time (from start to end): ____________________ Location of the headache: _________________________ ?Intensity of the headache: ____________________ Description of the headache: ______________________________________________________________ ?Hours of sleep the night before the headache: __________ ?Food or drinks before the headache started: ______________________________________________________________________________________ ?Events before the headache started: _______________________________________________________________________________________________ ?Symptoms before the headache started: __________________________________________________________________________________________ ?Symptoms during the headache: __________________________________________________________________________________________________ ?Treatment: ________________________________________________________________________________________________________________ ?Effect of treatment: _________________________________________________________________________________________________________ ?Other comments:  ___________________________________________________________________________________________________________ ?Date: _______________ Time (from start to end): ____________________ Location of the headache: _________________________ ?Intensity of the headache: ____________________ Description of the headache: ______________________________________________________________ ?Hours of sleep the night before the headache: __________ ?Food or drinks before the headache started: ______________________________________________________________________________________ ?Events before the headache started: ____________________________________________________________________________________________ ?Symptoms before the headache started: _________________________________________________________________________________________ ?Symptoms during the headache: _______________________________________________________________________________________________ ?Treatment: ________________________________________________________________________________________________________________ ?Effect of treatment: _________________________________________________________________________________________________________ ?Other comments: ___________________________________________________________________________________________________________ ?Date: _______________ Time (from start to end): ____________________ Location of the headache: _________________________ ?Intensity of the headache: ____________________ Description of the headache: ______________________________________________________________ ?Hours of sleep the night before the headache: __________ ?Food or drinks before the headache started: ______________________________________________________________________________________ ?Events before the headache started: ____________________________________________________________________________________________ ?Symptoms before the headache started:  _________________________________________________________________________________________ ?Symptoms during the headache: _______________________________________________________________________________________________ ?Treatment: ________________________________________________________________________________________________________________ ?Effect of treatment: _________________________________________________________________________________________________________ ?Other comments: ___________________________________________________________________________________________________________ ?Date: _______________ Time (from start to end): ____________________ Location of the headache: _________________________ ?Intensity of the headache: ____________________ Description of the headache: ______________________________________________________________ ?Hours of sleep the night before the headache: _________ ?Food or drinks before the headache started: ______________________________________________________________________________________ ?Events before the headache started: ____________________________________________________________________________________________ ?Symptoms before the headache started: _________________________________________________________________________________________ ?Symptoms during the headache: _______________________________________________________________________________________________ ?Treatment: ________________________________________________________________________________________________________________ ?Effect of treatment: _________________________________________________________________________________________________________ ?Other comments: ___________________________________________________________________________________________________________ ?Date: _______________ Time (from start to end): ____________________ Location of the headache: _________________________ ?Intensity of  the headache: ____________________ Description of the headache: ______________________________________________________________ ?Hours of sleep the night before the headache: _________ ?Food or drinks before the headache started: ______________________________________________________________________________________ ?Events before the headache started: ____________________________________________________________________________________________ ?Symptoms before the headache started: _________________________________________________________________________________________ ?Symptoms during the headache: _______________________________________________________________________________________________ ?Treatment: ________________________________________________________________________________________________________________ ?Effect of treatment: _________________________________________________________________________________________________________ ?Other comments: ___________________________________________________________________________________________________________ ?This information is not intended to replace advice given to you by your health care provider. Make sure you discuss any questions you have with your health care provider. ?Document Revised: 10/08/2020 Document Reviewed: 10/08/2020 ?Elsevier Patient Education ? [place]. ? ?

## 2021-10-06 NOTE — Assessment & Plan Note (Addendum)
No seizure activity in several years. ?Current use of depakote daily. ?Under the care of neurology: annual appt. ?

## 2021-10-06 NOTE — Assessment & Plan Note (Signed)
Chronic, onset 2017, intermittent, maybe once every other week. ?Location: Right forehead radiates to scalp and right ear,  ?Describes as mild, dull, associated with lightneadedness ?No syncope, no change in vision, numbness, no muscle weakness ?No specific trigger. ?Resolved with tylenol. ?Last head CT 2017: normal ? ?Continue use of tylenol prn. ?

## 2021-10-15 ENCOUNTER — Telehealth: Payer: PPO

## 2021-11-03 ENCOUNTER — Ambulatory Visit
Admission: RE | Admit: 2021-11-03 | Discharge: 2021-11-03 | Disposition: A | Discharge status: Home / Self Care | Payer: PPO | Source: Ambulatory Visit | Attending: Nurse Practitioner | Admitting: Nurse Practitioner

## 2021-11-03 DIAGNOSIS — R918 Other nonspecific abnormal finding of lung field: Secondary | ICD-10-CM

## 2021-11-11 ENCOUNTER — Telehealth: Payer: Self-pay

## 2021-11-11 NOTE — Progress Notes (Signed)
Chronic Care Management APPOINTMENT REMINDER   Nicholas Rasmussen was reminded to have all medications, supplements and any blood glucose and blood pressure readings available for review with [name], Pharm. D, at his telephone visit on 11/12/2021 at 2:00 pm .  Patient states he need to reschedule his appointment for personal reason.  Telephone follow up appointment with Care management team member scheduled for : 11/26/2021 at 2:00 pm.  Bessie [place] Pharmacist Assistant [phone]

## 2021-11-12 ENCOUNTER — Telehealth: Payer: PPO

## 2021-11-25 ENCOUNTER — Telehealth: Payer: Self-pay

## 2021-11-25 NOTE — Progress Notes (Signed)
Chronic Care Management APPOINTMENT REMINDER   Nicholas Rasmussen was reminded to have all medications, supplements and any blood glucose and blood pressure readings available for review with [name], Pharm. D, at his telephone visit on 11/26/2021 at 2:00 pm.  Patient confirm appointment.   [place] Pharmacist Assistant [phone]

## 2021-11-26 ENCOUNTER — Encounter: Payer: PPO | Attending: Nurse Practitioner | Admitting: Skilled Nursing Facility1

## 2021-11-26 ENCOUNTER — Encounter: Payer: Self-pay | Admitting: Skilled Nursing Facility1

## 2021-11-26 ENCOUNTER — Ambulatory Visit (INDEPENDENT_AMBULATORY_CARE_PROVIDER_SITE_OTHER): Payer: PPO

## 2021-11-26 DIAGNOSIS — E1142 Type 2 diabetes mellitus with diabetic polyneuropathy: Secondary | ICD-10-CM

## 2021-11-26 DIAGNOSIS — Z713 Dietary counseling and surveillance: Secondary | ICD-10-CM | POA: Insufficient documentation

## 2021-11-26 DIAGNOSIS — E1169 Type 2 diabetes mellitus with other specified complication: Secondary | ICD-10-CM

## 2021-11-26 DIAGNOSIS — E785 Hyperlipidemia, unspecified: Secondary | ICD-10-CM

## 2021-11-26 NOTE — Progress Notes (Signed)
Chronic Care Management Pharmacy Note  11/26/2021 Name:  Nicholas Rasmussen MRN:  [id number] DOB:  1946/01/09  Summary: Patient presents for CCM follow-up. -Patient was denied PAP for Jardiance, but patient states he is ok with continuing with the medication   Recommendations/Changes made from today's visit: Continue current medications   Plan: No further follow up required: Patient will reach out to clinical pharmacist as needed.   Subjective: Nicholas Rasmussen is an 76 y.o. year old male who is a primary patient of Nche, Charlene Brooke, NP.  The CCM team was consulted for assistance with disease management and care coordination needs.    Engaged with patient by telephone for follow up visit in response to provider referral for pharmacy case management and/or care coordination services.   Consent to Services:  The patient was given information about Chronic Care Management services, agreed to services, and gave verbal consent prior to initiation of services.  Please see initial visit note for detailed documentation.   Patient Care Team: Nche, Charlene Brooke, NP as PCP - General (Internal Medicine) Belva Crome, MD as PCP - Cardiology (Cardiology) Germaine Pomfret, [facility] as Pharmacist (Pharmacist)  Recent office visits: 08/19/21: Patient presented to [name], NP for follow-up.  Recent consult visits: None noted.  Hospital visits: None in previous 6 months   Objective:  Lab Results  Component Value Date   CREATININE 1.25 10/06/2021   BUN 18 10/06/2021   GFR 57.11 (L) 10/06/2021   EGFR 66 11/18/2020   GFRNONAA 73 11/13/2019   GFRAA 84 11/13/2019   NA 140 10/06/2021   K 4.0 10/06/2021   CALCIUM 9.3 10/06/2021   CO2 32 10/06/2021   GLUCOSE 106 (H) 10/06/2021    Lab Results  Component Value Date/Time   HGBA1C 6.5 06/12/2021 08:28 AM   HGBA1C 6.2 (A) 03/02/2021 12:18 PM   HGBA1C 6.1 (A) 04/09/2020 09:45 AM   HGBA1C 6.2 10/08/2019 09:51 AM   GFR 57.11 (L)  10/06/2021 09:37 AM   GFR 56.69 (L) 06/12/2021 08:28 AM   MICROALBUR 1.1 02/10/2021 09:14 AM   MICROALBUR 1.5 10/08/2019 09:51 AM    Last diabetic Eye exam:  Lab Results  Component Value Date/Time   HMDIABEYEEXA No Retinopathy 01/23/2021 12:00 AM    Last diabetic Foot exam: No results found for: "HMDIABFOOTEX"   Lab Results  Component Value Date   CHOL 137 06/12/2021   HDL 45.70 06/12/2021   LDLCALC 68 06/12/2021   TRIG 117.0 06/12/2021   CHOLHDL 3 06/12/2021       Latest Ref Rng & Units 10/06/2021    9:37 AM 06/12/2021    8:28 AM 04/11/2019   10:31 AM  Hepatic Function  Total Protein 6.0 - 8.3 g/dL  7.2  7.2   Albumin 3.5 - 5.2 g/dL 4.1  4.0  4.2   AST 0 - 37 U/L  16  16   ALT 0 - 53 U/L  6  6   Alk Phosphatase 39 - 117 U/L  52  50   Total Bilirubin 0.2 - 1.2 mg/dL  0.3  0.5   Bilirubin, Direct 0.0 - 0.3 mg/dL  0.1  0.1     Lab Results  Component Value Date/Time   TSH 1.25 10/08/2019 09:51 AM   TSH 1.08 11/12/2014 11:32 AM       Latest Ref Rng & Units 06/05/2021   12:57 PM 10/31/2019    2:49 PM 10/15/2017    9:56 PM  CBC  WBC 4.0 -  10.5 K/uL 4.9  5.0  6.9   Hemoglobin 13.0 - 17.0 g/dL 12.9  12.3  12.4   Hematocrit 39.0 - 52.0 % 40.5  37.6  38.8   Platelets 150.0 - 400.0 K/uL 200.0  187.0  202     No results found for: "VD25OH"  Clinical ASCVD: No  The 10-year ASCVD risk score (Arnett DK, et al., 2019) is: 20.5%   Values used to calculate the score:     Age: 8 years     Sex: Male     Is Non-Hispanic African American: Yes     Diabetic: Yes     Tobacco smoker: No     Systolic Blood Pressure: 702 mmHg     Is BP treated: No     HDL Cholesterol: 45.7 mg/dL     Total Cholesterol: 137 mg/dL       11/26/2021    3:19 PM 06/30/2021    1:43 PM 05/26/2021    1:41 PM  Depression screen PHQ 2/9  Decreased Interest 0 0 0  Down, Depressed, Hopeless 0 0 0  PHQ - 2 Score 0 0 0    Social History   Tobacco Use  Smoking Status Former  Smokeless Tobacco Never   BP  Readings from Last 3 Encounters:  10/06/21 124/78  08/19/21 118/60  07/03/21 108/66   Pulse Readings from Last 3 Encounters:  10/06/21 70  08/19/21 62  07/03/21 (!) 52   Wt Readings from Last 3 Encounters:  11/26/21 243 lb 8 oz (110.5 kg)  10/06/21 243 lb (110.2 kg)  08/19/21 239 lb 3.2 oz (108.5 kg)   BMI Readings from Last 3 Encounters:  11/26/21 29.64 kg/m  10/06/21 29.58 kg/m  08/19/21 29.12 kg/m    Assessment/Interventions: Review of patient past medical history, allergies, medications, health status, including review of consultants reports, laboratory and other test data, was performed as part of comprehensive evaluation and provision of chronic care management services.   SDOH:  (Social Determinants of Health) assessments and interventions performed: Yes   SDOH Screenings   Alcohol Screen: Low Risk  (05/26/2021)   Alcohol Screen    Last Alcohol Screening Score (AUDIT): 1  Depression (PHQ2-9): Low Risk  (11/26/2021)   Depression (PHQ2-9)    PHQ-2 Score: 0  Financial Resource Strain: Low Risk  (09/03/2021)   Overall Financial Resource Strain (CARDIA)    Difficulty of Paying Living Expenses: Not hard at all  Food Insecurity: No Food Insecurity (05/26/2021)   Hunger Vital Sign    Worried About Running Out of Food in the Last Year: Never true    Ran Out of Food in the Last Year: Never true  Housing: Low Risk  (05/26/2021)   Housing    Last Housing Risk Score: 0  Physical Activity: Sufficiently Active (05/26/2021)   Exercise Vital Sign    Days of Exercise per Week: 2 days    Minutes of Exercise per Session: 100 min  Social Connections: Moderately Integrated (05/26/2021)   Social Connection and Isolation Panel [NHANES]    Frequency of Communication with Friends and Family: Twice a week    Frequency of Social Gatherings with Friends and Family: Twice a week    Attends Religious Services: More than 4 times per year    Active Member of Clubs or Organizations: No    Attends  Archivist Meetings: Never    Marital Status: Married  Stress: No Stress Concern Present (05/26/2021)   [employer] [place] -  Occupational Stress Questionnaire    Feeling of Stress : Not at all  Tobacco Use: Medium Risk (11/26/2021)   Patient History    Smoking Tobacco Use: Former    Smokeless Tobacco Use: Never    Passive Exposure: Not on file  Transportation Needs: No Transportation Needs (05/26/2021)   PRAPARE - Hydrologist (Medical): No    Lack of Transportation (Non-Medical): No    CCM Care Plan  No Known Allergies  Medications Reviewed Today     Reviewed by Ruby Cola, RD (Registered Dietitian) on 11/26/21 at [place] List Status: <None>   Medication Order Taking? Sig Documenting Provider Last Dose Status Informant  acetaminophen (TYLENOL) 500 MG tablet [id number] No Take 1 tablet (500 mg total) by mouth every 8 (eight) hours as needed. Nche, Charlene Brooke, NP Taking Active   acyclovir (ZOVIRAX) 200 MG capsule [id number] No Take 1 capsule (200 mg total) by mouth 5 (five) times daily. Nche, Charlene Brooke, NP Taking Active   atorvastatin (LIPITOR) 20 MG tablet [id number] No Take 1 tablet (20 mg total) by mouth daily at 6 PM. Nche, Charlene Brooke, NP Taking Active   clotrimazole (LOTRIMIN) 1 % cream [id number] No APPLY TO AFFECTED AREA TWICE A DAY Nche, Charlene Brooke, NP Taking Active   diclofenac Sodium (VOLTAREN) 1 % GEL [id number] No Apply a small grape sized dollop to sore joint up to every 6 hours as needed Libby Maw, MD Taking Active   divalproex (DEPAKOTE ER) 500 MG 24 hr tablet [id number] No Take 1 tablet (500 mg total) by mouth at bedtime. Nche, Charlene Brooke, NP Taking Active   empagliflozin (JARDIANCE) 10 MG TABS tablet [id number] No Take 1 tablet (10 mg total) by mouth daily. Flossie Buffy, NP Taking Active   gabapentin (NEURONTIN) 300 MG capsule [id number] No TAKE 1 CAPSULE BY MOUTH EVERYDAY AT  BEDTIME Nche, Charlene Brooke, NP Taking Active   ibuprofen (ADVIL) 800 MG tablet [id number] No Take 1 tablet (800 mg total) by mouth 3 (three) times daily. Hans Eden, NP Taking Active   meclizine (ANTIVERT) 25 MG tablet [id number] No Take 1 tablet (25 mg total) by mouth 3 (three) times daily as needed. Nche, Charlene Brooke, NP Taking Active   metoprolol succinate (TOPROL-XL) 25 MG 24 hr tablet [id number] No Take 1 tablet (25 mg total) by mouth daily. Flossie Buffy, NP Taking Active   omeprazole (PRILOSEC) 20 MG capsule [id number] No TAKE 1 CAPSULE BY MOUTH EVERY DAY AS NEEDED Nche, Charlene Brooke, NP Taking Active             Patient Active Problem List   Diagnosis Date Noted   Multiple pulmonary nodules 10/06/2021   Hyperlipidemia associated with type 2 diabetes mellitus ([place]) 08/19/2021   [name] 04/07/2021   Complex renal cyst 04/07/2021   Chronic right-sided low back pain without sciatica 03/31/2020   Varicose veins of both lower extremities with pain 02/02/2019   Encounter for therapeutic drug level monitoring 09/19/2018   Type 2 diabetes mellitus with diabetic polyneuropathy, without long-term current use of insulin ([place]) 03/29/2018   Diabetic neuropathy ([place]) 03/29/2018   Chronic pain of left knee 03/23/2018   CAD (coronary artery disease) 08/03/2017   Post herpetic neuralgia 07/29/2016   Dizziness 01/02/2016   Chronic midline low back pain without sciatica 06/06/2015   Nonintractable episodic headache 03/13/2015   GERD (gastroesophageal reflux disease) 11/15/2014   Seizure disorder ([place])  05/07/2014   Allergic rhinitis 05/07/2014    Immunization History  Administered Date(s) Administered   Fluad Quad(high Dose 65+) 02/01/2019, 02/26/2020, 03/02/2021   Influenza Whole 02/21/2014   Influenza, High Dose Seasonal PF 02/03/2016, 02/03/2017, 02/28/2018   Influenza,inj,Quad PF,6+ Mos 03/13/2015   PFIZER(Purple Top)SARS-COV-2  Vaccination 07/22/2019, 08/21/2019, 03/03/2020   Pneumococcal Conjugate-13 05/06/2015   Pneumococcal Polysaccharide-23 05/07/2014   Tdap 05/25/2011   Zoster Recombinat (Shingrix) 12/03/2016, 02/16/2017    Conditions to be addressed/monitored:  Hyperlipidemia, Diabetes, GERD, and Seizures  Care Plan : General Pharmacy (Adult)  Updates made by Germaine Pomfret, RPH since 11/26/2021 12:00 AM     Problem: Hyperlipidemia, Diabetes, GERD, and Seizures   Priority: High     Long-Range Goal: Patient-Specific Goal   Start Date: 09/03/2021  Expected End Date: 09/04/2022  This Visit's Progress: On track  Recent Progress: On track  Priority: High  Note:   Current Barriers:  Unable to independently afford treatment regimen  Pharmacist Clinical Goal(s):  Patient will verbalize ability to afford treatment regimen through collaboration with PharmD and provider.   Interventions: 1:1 collaboration with Nche, Charlene Brooke, NP regarding development and update of comprehensive plan of care as evidenced by provider attestation and co-signature Inter-disciplinary care team collaboration (see longitudinal plan of care) Comprehensive medication review performed; medication list updated in electronic medical record  Hyperlipidemia: (LDL goal < 100) -Controlled -Current treatment: Atorvastatin 20 mg daily -Medications previously tried: NA  -Recommended to continue current medication  Diabetes (A1c goal <7%) -Controlled -Current medications: Jardiance 10 mg daily: Appropriate, Effective, Safe, Query accessible -Medications previously tried: Glipizide, Metformin  -Denies hypoglycemic/hyperglycemic symptoms -Patient was denied PAP for Jardiance, but patient states he is ok with continuing with the medication due to the cardiovascular and renal benefits.   -Recommended to continue current medication  Patient Goals/Self-Care Activities Patient will:  - check glucose daily before breakfast,  document, and provide at future appointments  Follow Up Plan: No further follow up required: Patient will reach out to clinical pharmacist as needed.       Medication Assistance: Application for Jardiance  medication assistance program. in process.  Anticipated assistance start date TBD.  See plan of care for additional detail.  Patient's preferred pharmacy is:  [id number] [place] [address]Phone: [phone]: [phone] [place] [id number]: [phone]: [phone] Uses pill box? Yes Pt endorses 100% compliance  We discussed: Current pharmacy is preferred with insurance plan and patient is satisfied with pharmacy services Patient decided to: Continue current medication management strategy  Care Plan and Follow Up Patient Decision:  Patient agrees to Care Plan and Follow-up.  Plan: No further follow up required: Patient will reach out to clinical pharmacist as needed.  AJunius Argyle PharmD, BPara March CPP Clinical Pharmacist Practitioner  [facility] [phone]

## 2021-11-26 NOTE — Progress Notes (Signed)
Diabetes Self-Management Education  Visit Type: First/Initial   11/26/2021  [name], identified by name and date of birth, is a 76 y.o. male with a diagnosis of Diabetes: Type 2.   ASSESSMENT  Height '6\' 4"'$  (1.93 m), weight 243 lb 8 oz (110.5 kg). Body mass index is 29.64 kg/m.  Pt states he used to play basketball and run track.  Pt states abut 4 years ago he was diagnosed with DM. Pt states he wished he was about 230 pounds. Pt states he knows better with his diet but does not do better. Pt states he recognizes he overindulges.  Pt states he loves sweets and does love water.  Pt does worry about his neuropathy.  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 A couple hours 3 times a week.    Pt states he loves salmon. Pt states he does eat too late for having GERD.   Pt is doing very well with an overall healthy lifestyle just some small tweaks and getting back into his usual healthy habits.   DM Rx: Jardiance    24 recall: Eating out 2-3 meals a week First meal: 2 oatmeal packets or raisin bran or cheerios or 2 eggs and grits  Snack: popcorn or ritz Second meal: fast foodSnack: grapes or watermelon or cookies Third meal 6 or 10pm: potatoes, ribeye, peas, corn bread, corn or fast food salad or grits or oatmeal or fast food with a fast food dessert Snack:  Beverages: 50/50 orange juice, water, sweet tea (not often), sprite (not often)   Goals: -avoid the picking throughout the day -avoid snacking on calorically dense snacks while watching sports -eat 3 meals -eat non starchy vegetables 2 times a day 7 days a week   Diabetes Self-Management Education - 11/26/21 1520       Visit Information   Visit Type First/Initial      Initial Visit   Diabetes Type Type 2    Are you currently following a meal plan? No    Are you taking your medications as prescribed? Yes      Health Coping   How would you rate your overall health? Excellent      Psychosocial Assessment    Patient Belief/Attitude about Diabetes Motivated to manage diabetes    What is the hardest part about your diabetes right now, causing you the most concern, or is the most worrisome to you about your diabetes?   Making healty food and beverage choices    Self-care barriers None    Self-management support Friends;Family    Patient Concerns Nutrition/Meal planning;Weight Control    Special Needs None    Preferred Learning Style Auditory    Learning Readiness Change in progress    How often do you need to have someone help you when you read instructions, pamphlets, or other written materials from your doctor or pharmacy? 1 - Never      Pre-Education Assessment   Patient understands the diabetes disease and treatment process. Needs Instruction    Patient understands incorporating nutritional management into lifestyle. Needs Instruction    Patient undertands incorporating physical activity into lifestyle. Needs Instruction    Patient understands using medications safely. Needs Instruction    Patient understands monitoring blood glucose, interpreting and using results Needs Instruction    Patient understands prevention, detection, and treatment of acute complications. Needs Instruction    Patient understands prevention, detection, and treatment of chronic complications. Needs Instruction    Patient understands how  to develop strategies to address psychosocial issues. Needs Instruction    Patient understands how to develop strategies to promote health/change behavior. Needs Instruction      Complications   Last HgB A1C per patient/outside source 6.5 %    How often do you check your blood sugar? 0 times/day (not testing)    Have you had a dilated eye exam in the past 12 months? Yes    Have you had a dental exam in the past 12 months? Yes    Are you checking your feet? No      Activity / Exercise   Activity / Exercise Type Moderate (swimming / aerobic walking)    How many days per week do you  exercise? 4    How many minutes per day do you exercise? 45    Total minutes per week of exercise 180      Patient Education   Previous Diabetes Education No    Disease Pathophysiology Factors that contribute to the development of diabetes;Definition of diabetes, type 1 and 2, and the diagnosis of diabetes    Healthy Eating Role of diet in the treatment of diabetes and the relationship between the three main macronutrients and blood glucose level;Food label reading, portion sizes and measuring food.;Plate Method;Carbohydrate counting;Information on hints to eating out and maintain blood glucose control.    Being Active Role of exercise on diabetes management, blood pressure control and cardiac health.    Medications Reviewed patients medication for diabetes, action, purpose, timing of dose and side effects.    Monitoring Taught/evaluated SMBG meter.    Acute complications Taught prevention, symptoms, and  treatment of hypoglycemia - the 15 rule.;Discussed and identified patients' prevention, symptoms, and treatment of hyperglycemia.    Chronic complications Dental care;Retinopathy and reason for yearly dilated eye exams;Assessed and discussed foot care and prevention of foot problems    Diabetes Stress and Support Role of stress on diabetes      Individualized Goals (developed by patient)   Nutrition Follow meal plan discussed;General guidelines for healthy choices and portions discussed    Physical Activity Exercise 5-7 days per week;60 minutes per day;45 minutes per day    Medications take my medication as prescribed    Problem Solving Eating Pattern      Post-Education Assessment   Patient understands the diabetes disease and treatment process. Demonstrates understanding / competency    Patient understands incorporating nutritional management into lifestyle. Demonstrates understanding / competency    Patient undertands incorporating physical activity into lifestyle. Demonstrates  understanding / competency    Patient understands using medications safely. Demonstrates understanding / competency    Patient understands monitoring blood glucose, interpreting and using results Demonstrates understanding / competency    Patient understands prevention, detection, and treatment of acute complications. Demonstrates understanding / competency    Patient understands prevention, detection, and treatment of chronic complications. Demonstrates understanding / competency    Patient understands how to develop strategies to address psychosocial issues. Demonstrates understanding / competency    Patient understands how to develop strategies to promote health/change behavior. Demonstrates understanding / competency      Outcomes   Expected Outcomes Demonstrated interest in learning. Expect positive outcomes    Future DMSE PRN    Program Status Completed             Individualized Plan for Diabetes Self-Management Training:   Learning Objective:  Patient will have a greater understanding of diabetes self-management. Patient education plan is to attend individual  and/or group sessions per assessed needs and concerns.   Plan:   There are no Patient Instructions on file for this visit.  Expected Outcomes:  Demonstrated interest in learning. Expect positive outcomes  Education material provided: ADA - How to Thrive: A Guide for Your Journey with Diabetes, Food label handouts, Meal plan card, My Plate, and Snack sheet  If problems or questions, patient to contact team via:  Phone and Email  Future DSME appointment: PRN

## 2021-11-26 NOTE — Patient Instructions (Signed)
Visit Information It was great speaking with you today!  Please let me know if you have any questions about our visit.   Goals Addressed             This Visit's Progress    Monitor and Manage My Blood Sugar-Diabetes Type 2   On track    Timeframe:  Long-Range Goal Priority:  High Start Date: 09/03/2021                            Expected End Date: 09/04/2022                      Follow Up as needed   - check blood sugar at prescribed times - check blood sugar if I feel it is too high or too low - enter blood sugar readings and medication or insulin into daily log    Why is this important?   Checking your blood sugar at home helps to keep it from getting very high or very low.  Writing the results in a diary or log helps the doctor know how to care for you.  Your blood sugar log should have the time, date and the results.  Also, write down the amount of insulin or other medicine that you take.  Other information, like what you ate, exercise done and how you were feeling, will also be helpful.     Notes:         Patient Care Plan: General Pharmacy (Adult)     Problem Identified: Hyperlipidemia, Diabetes, GERD, and Seizures   Priority: High     Long-Range Goal: Patient-Specific Goal   Start Date: 09/03/2021  Expected End Date: 09/04/2022  This Visit's Progress: On track  Recent Progress: On track  Priority: High  Note:   Current Barriers:  Unable to independently afford treatment regimen  Pharmacist Clinical Goal(s):  Patient will verbalize ability to afford treatment regimen through collaboration with PharmD and provider.   Interventions: 1:1 collaboration with Nche, Charlene Brooke, NP regarding development and update of comprehensive plan of care as evidenced by provider attestation and co-signature Inter-disciplinary care team collaboration (see longitudinal plan of care) Comprehensive medication review performed; medication list updated in electronic medical  record  Hyperlipidemia: (LDL goal < 100) -Controlled -Current treatment: Atorvastatin 20 mg daily -Medications previously tried: NA  -Recommended to continue current medication  Diabetes (A1c goal <7%) -Controlled -Current medications: Jardiance 10 mg daily: Appropriate, Effective, Safe, Query accessible -Medications previously tried: Glipizide, Metformin  -Denies hypoglycemic/hyperglycemic symptoms -Patient was denied PAP for Jardiance, but patient states he is ok with continuing with the medication due to the cardiovascular and renal benefits.   -Recommended to continue current medication  Patient Goals/Self-Care Activities Patient will:  - check glucose daily before breakfast, document, and provide at future appointments  Follow Up Plan: No further follow up required: Patient will reach out to clinical pharmacist as needed.      Patient agreed to services and verbal consent obtained.   Patient verbalizes understanding of instructions and care plan provided today and agrees to view in [place]. Active MyChart status and patient understanding of how to access instructions and care plan via MyChart confirmed with patient.     [name], PharmD, Para March, CPP Clinical Pharmacist Practitioner  [facility]  [phone]

## 2021-12-02 ENCOUNTER — Telehealth: Payer: Self-pay | Admitting: Nurse Practitioner

## 2021-12-02 NOTE — Telephone Encounter (Signed)
Pt called with a dilemma about his Jardiance. The pharmacy said $400 for his 90 day supply. He cannot afford this amount. Is there an alternative?

## 2021-12-03 NOTE — Telephone Encounter (Signed)
Called & spoke w/ pt, adv that we are still working on this matter, I asked if he could give [place] until the end of the workday to give him an update back in regards to getting an alternative for his Jardiance.

## 2021-12-03 NOTE — Telephone Encounter (Signed)
Pt called again today because he never got a call back yesterday about medication empagliflozin (JARDIANCE) 10 MG TABS tablet that has got to expensive for pt

## 2021-12-04 ENCOUNTER — Other Ambulatory Visit: Payer: Self-pay | Admitting: Nurse Practitioner

## 2021-12-04 DIAGNOSIS — E1142 Type 2 diabetes mellitus with diabetic polyneuropathy: Secondary | ICD-10-CM

## 2021-12-04 MED ORDER — GLIPIZIDE ER 5 MG PO TB24: 5.0000 mg | ORAL_TABLET | Freq: Every day | ORAL | 1 refills | Status: DC

## 2021-12-04 NOTE — Telephone Encounter (Signed)
Pt advised.

## 2021-12-04 NOTE — Assessment & Plan Note (Signed)
Switch jardiance to glipizide due to high copay

## 2021-12-04 NOTE — Telephone Encounter (Signed)
Patient was denied patient assistance for Jardiance. I would be happy to see if he qualifies for patient assistance for Wilder Glade, but he may not meet the income requirements for that program as well. Unfortunately if he can't afford the medication now that he is in the [facility] I would recommend he stop Jardiance and resume his previous glipizide dose.   [name], PharmD, Para [place], CPP Clinical Pharmacist Practitioner  [facility]  [phone]

## 2021-12-08 ENCOUNTER — Ambulatory Visit (INDEPENDENT_AMBULATORY_CARE_PROVIDER_SITE_OTHER): Payer: PPO | Admitting: Family Medicine

## 2021-12-08 ENCOUNTER — Encounter: Payer: Self-pay | Admitting: Family Medicine

## 2021-12-08 VITALS — BP 112/68 | HR 50 | Temp 97.1°F | Ht 76.0 in | Wt 242.8 lb

## 2021-12-08 DIAGNOSIS — S29011D Strain of muscle and tendon of front wall of thorax, subsequent encounter: Secondary | ICD-10-CM

## 2021-12-08 DIAGNOSIS — S29011A Strain of muscle and tendon of front wall of thorax, initial encounter: Secondary | ICD-10-CM | POA: Insufficient documentation

## 2021-12-08 DIAGNOSIS — K5901 Slow transit constipation: Secondary | ICD-10-CM | POA: Diagnosis not present

## 2021-12-08 DIAGNOSIS — R0789 Other chest pain: Secondary | ICD-10-CM | POA: Insufficient documentation

## 2021-12-08 MED ORDER — DOCUSATE SODIUM 100 MG PO CAPS: 100.0000 mg | ORAL_CAPSULE | Freq: Two times a day (BID) | ORAL | 0 refills | Status: DC

## 2021-12-08 NOTE — Progress Notes (Signed)
Established Patient Office Visit  Subjective   Patient ID: Nicholas Rasmussen, male    DOB: 1946/01/22  Age: 76 y.o. MRN: [id number]  Chief Complaint  Patient presents with   Pain    Left side pain x few months come and go becoming worse x 2 weeks.     HPI for evaluation of ongoing intermittent "left sided" pain.  Seem to become worse this past week and after a ballroom dancing event on Friday Saturday and Sunday.  He noticed it Saturday afternoon.  There was pain in his left lower and anterior chest wall/upper abdomen.  There is pain with rotation and side to side bending and various other dancing maneuvers.  He also suffers from intermittent constipation.  His stools are often hard and ball shaped.  He uses milk of magnesia as needed.  He believes that he has plenty of fiber in his diet.  He is active physically throughout the week and often on weekends with his ballroom dancing.  There is been no blood or melena associated with his stooling.  Colonoscopy is up-to-date.  Recent scans of chest and abdomen.  History of diabetes and elevated cholesterol both are well controlled with current therapy.  Pain is often relieved with defecation.  Recently applied a lidocaine patch which is also helped.    Review of Systems  Constitutional: Negative.   HENT: Negative.    Eyes:  Negative for blurred vision, discharge and redness.  Respiratory: Negative.    Cardiovascular: Negative.   Gastrointestinal:  Positive for abdominal pain and constipation. Negative for blood in stool and melena.  Genitourinary: Negative.   Musculoskeletal: Negative.  Negative for myalgias.  Skin:  Negative for rash.  Neurological:  Negative for tingling, loss of consciousness and weakness.  Endo/Heme/Allergies:  Negative for polydipsia.  Psychiatric/Behavioral: Negative.        Objective:     BP 112/68 (BP Location: Right Arm, Patient Position: Sitting, Cuff Size: Normal)   Pulse (!) 50   Temp (!) 97.1 F (36.2 C)  (Temporal)   Ht '6\' 4"'$  (1.93 m)   Wt 242 lb 12.8 oz (110.1 kg)   SpO2 95%   BMI 29.55 kg/m    Physical Exam Constitutional:      General: He is not in acute distress.    Appearance: Normal appearance. He is not ill-appearing, toxic-appearing or diaphoretic.  HENT:     Head: Normocephalic and atraumatic.     Right Ear: External ear normal.     Left Ear: External ear normal.  Eyes:     General: No scleral icterus.       Right eye: No discharge.        Left eye: No discharge.     Extraocular Movements: Extraocular movements intact.     Conjunctiva/sclera: Conjunctivae normal.  Cardiovascular:     Rate and Rhythm: Normal rate and regular rhythm.  Pulmonary:     Effort: Pulmonary effort is normal. No respiratory distress.     Breath sounds: Normal breath sounds.  Abdominal:     General: Bowel sounds are normal. There is no distension.     Tenderness: There is no abdominal tenderness. There is no right CVA tenderness, left CVA tenderness, guarding or rebound.     Hernia: No hernia is present. There is no hernia in the left inguinal area or right inguinal area.  Genitourinary:    Penis: Uncircumcised. No phimosis, paraphimosis, hypospadias, erythema, tenderness, discharge, swelling or lesions.  Testes:        Right: Mass, tenderness or swelling not present. Right testis is descended.        Left: Mass, tenderness or swelling not present. Left testis is descended.     Epididymis:     Right: Not inflamed or enlarged.     Left: Not inflamed or enlarged.  Musculoskeletal:     Cervical back: No rigidity or tenderness.  Lymphadenopathy:     Lower Body: No right inguinal adenopathy. No left inguinal adenopathy.  Skin:    General: Skin is warm and dry.  Neurological:     Mental Status: He is alert and oriented to person, place, and time.  Psychiatric:        Mood and Affect: Mood normal.        Behavior: Behavior normal.      No results found for any visits on  12/08/21.    The 10-year ASCVD risk score (Arnett DK, et al., 2019) is: 17.4%    Assessment & Plan:   Problem List Items Addressed This Visit       Digestive   Slow transit constipation - Primary   Relevant Medications   docusate sodium (COLACE) 100 MG capsule     Musculoskeletal and Integument   Chest wall muscle strain    Return schedule follow up with Baldo Ash if persisting, for may continue lidocaine patches. .  Chest wall/abdominal wall strain.  May continue lidocaine patch as needed.  Add Colace.  Information given on constipation.  Encouraged him to stay active with ballroom dancing.  Libby Maw, MD

## 2021-12-18 ENCOUNTER — Encounter: Payer: Self-pay | Admitting: Nurse Practitioner

## 2021-12-18 ENCOUNTER — Ambulatory Visit (INDEPENDENT_AMBULATORY_CARE_PROVIDER_SITE_OTHER): Payer: PPO | Admitting: Nurse Practitioner

## 2021-12-18 VITALS — BP 122/78 | HR 45 | Temp 96.9°F | Ht 76.0 in | Wt 246.2 lb

## 2021-12-18 DIAGNOSIS — I77810 Thoracic aortic ectasia: Secondary | ICD-10-CM | POA: Diagnosis not present

## 2021-12-18 DIAGNOSIS — E1141 Type 2 diabetes mellitus with diabetic mononeuropathy: Secondary | ICD-10-CM

## 2021-12-18 DIAGNOSIS — R490 Dysphonia: Secondary | ICD-10-CM

## 2021-12-18 DIAGNOSIS — E1142 Type 2 diabetes mellitus with diabetic polyneuropathy: Secondary | ICD-10-CM | POA: Diagnosis not present

## 2021-12-18 LAB — RENAL FUNCTION PANEL
Albumin: 4.1 g/dL (ref 3.5–5.2)
BUN: 17 mg/dL (ref 6–23)
CO2: 31 mEq/L (ref 19–32)
Calcium: 9 mg/dL (ref 8.4–10.5)
Chloride: 103 mEq/L (ref 96–112)
Creatinine, Ser: 1.15 mg/dL (ref 0.40–1.50)
GFR: 63.03 mL/min (ref >60.00)
Glucose, Bld: 80 mg/dL (ref 70–99)
Phosphorus: 2.7 mg/dL (ref 2.3–4.6)
Potassium: 4 mEq/L (ref 3.5–5.1)
Sodium: 139 mEq/L (ref 135–145)

## 2021-12-18 LAB — MICROALBUMIN / CREATININE URINE RATIO
Creatinine,U: 179.7 mg/dL
Microalb Creat Ratio: 0.4 mg/g (ref 0.0–30.0)
Microalb, Ur: 0.8 mg/dL (ref 0.0–1.9)

## 2021-12-18 LAB — HEMOGLOBIN A1C: Hgb A1c MFr Bld: 6.5 % (ref 4.6–6.5)

## 2021-12-18 NOTE — Progress Notes (Signed)
Established Patient Visit  Patient: Nicholas Rasmussen   DOB: 1945/08/27   76 y.o. Male  MRN: [id number] Visit Date: 12/18/2021  Subjective:    Chief Complaint  Patient presents with   Acute Visit    C/o burning in both feet & sinus drainage  No other concerns     HPI Reports intermittent throat clearing and hoarseness, GERD symptoms controlled with omeprazole and avoiding certain foods. No dysphagia, no weight loss, no post nasal drainage.  Diabetic neuropathy (HCC) Chronic, no change, intermittent burning and tingling sensation in feet, worse at bedtime, controlled with use of gabapentin '300mg'$  at hs. 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 Completed foot exam today: diminished vibratory sensation. Skin intact.  Advised about importance of proper skin care and foot wear Maintain gabapentin dose   Type 2 diabetes mellitus with diabetic polyneuropathy, without long-term current use of insulin (HCC) Repeat hgbA1c and BMP Maintain glipizide dose  Aortic root dilatation (HCC) Last echo 2020: aortic root measuring at 4.2cm, normal aortic valve BP at goal Chronic and stable LE neuropathy Normal distal pulses  no tobacco use  LDL at goal with atorvastatin  Repeat echocardiogram  BP Readings from Last 3 Encounters:  12/18/21 122/78  12/08/21 112/68  10/06/21 124/78    Wt Readings from Last 3 Encounters:  12/18/21 246 lb 3.2 oz (111.7 kg)  12/08/21 242 lb 12.8 oz (110.1 kg)  11/26/21 243 lb 8 oz (110.5 kg)    Reviewed medical, surgical, and social history today  Medications: Outpatient Medications Prior to Visit  Medication Sig   acetaminophen (TYLENOL) 500 MG tablet Take 1 tablet (500 mg total) by mouth every 8 (eight) hours as needed.   acyclovir (ZOVIRAX) 200 MG capsule Take 1 capsule (200 mg total) by mouth 5 (five) times daily.   atorvastatin (LIPITOR) 20 MG tablet Take 1 tablet (20 mg total) by mouth daily at 6 PM.    clotrimazole (LOTRIMIN) 1 % cream APPLY TO AFFECTED AREA TWICE A DAY   diclofenac Sodium (VOLTAREN) 1 % GEL Apply a small grape sized dollop to sore joint up to every 6 hours as needed   divalproex (DEPAKOTE ER) 500 MG 24 hr tablet Take 1 tablet (500 mg total) by mouth at bedtime.   docusate sodium (COLACE) 100 MG capsule Take 1 capsule (100 mg total) by mouth 2 (two) times daily.   gabapentin (NEURONTIN) 300 MG capsule TAKE 1 CAPSULE BY MOUTH EVERYDAY AT BEDTIME   glipiZIDE (GLUCOTROL XL) 5 MG 24 hr tablet Take 1 tablet (5 mg total) by mouth daily with breakfast.   ibuprofen (ADVIL) 800 MG tablet Take 1 tablet (800 mg total) by mouth 3 (three) times daily.   meclizine (ANTIVERT) 25 MG tablet Take 1 tablet (25 mg total) by mouth 3 (three) times daily as needed.   metoprolol succinate (TOPROL-XL) 25 MG 24 hr tablet Take 1 tablet (25 mg total) by mouth daily.   omeprazole (PRILOSEC) 20 MG capsule TAKE 1 CAPSULE BY MOUTH EVERY DAY AS NEEDED   No facility-administered medications prior to visit.   Reviewed past medical and social history.   ROS per HPI above      Objective:  BP 122/78 (BP Location: Right Arm, Patient Position: Sitting, Cuff Size: Normal)   Pulse (!) 45   Temp (!) 96.9 F (36.1 C) (Temporal)   Ht '6\' 4"'$  (1.93 m)  Wt 246 lb 3.2 oz (111.7 kg)   SpO2 97%   BMI 29.97 kg/m      Physical Exam Cardiovascular:     Rate and Rhythm: Normal rate.     Pulses: Normal pulses.  Pulmonary:     Effort: Pulmonary effort is normal.  Musculoskeletal:     Right lower leg: No edema.     Left lower leg: No edema.  Skin:    General: Skin is warm and dry.     Findings: No erythema or rash.  Neurological:     Mental Status: He is alert and oriented to person, place, and time.     No results found for any visits on 12/18/21.    Assessment & Plan:    Problem List Items Addressed This Visit       Cardiovascular and Mediastinum   Aortic root dilatation ([place])    Last echo 2020:  aortic root measuring at 4.2cm, normal aortic valve BP at goal Chronic and stable LE neuropathy Normal distal pulses  no tobacco use  LDL at goal with atorvastatin  Repeat echocardiogram      Relevant Orders   ECHOCARDIOGRAM COMPLETE     Endocrine   Diabetic neuropathy (HCC) - Primary    Chronic, no change, intermittent burning and tingling sensation in feet, worse at bedtime, controlled with use of gabapentin '300mg'$  at hs. 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 Completed foot exam today: diminished vibratory sensation. Skin intact.  Advised about importance of proper skin care and foot wear Maintain gabapentin dose       Relevant Orders   Renal Function Panel   Hemoglobin A1c   Microalbumin / creatinine urine ratio   Type 2 diabetes mellitus with diabetic polyneuropathy, without long-term current use of insulin (HCC)    Repeat hgbA1c and BMP Maintain glipizide dose      Relevant Orders   Renal Function Panel   Hemoglobin A1c   Microalbumin / creatinine urine ratio   Other Visit Diagnoses     Hoarseness       Relevant Orders   Ambulatory referral to ENT      Return in about 6 months (around 06/20/2022) for DM and HTN, hyperlipidemia (fasting).     [name], NP

## 2021-12-18 NOTE — Assessment & Plan Note (Signed)
Repeat hgbA1c and BMP Maintain glipizide dose

## 2021-12-18 NOTE — Assessment & Plan Note (Addendum)
Last echo 2020: aortic root measuring at 4.2cm, normal aortic valve BP at goal Chronic and stable LE neuropathy Normal distal pulses  no tobacco use  LDL at goal with atorvastatin  Repeat echocardiogram

## 2021-12-18 NOTE — Patient Instructions (Signed)
Continue use of gabapentin and proper foot care. You will be contacted to schedule appt with ENT. Go to lab

## 2021-12-18 NOTE — Assessment & Plan Note (Signed)
Chronic, no change, intermittent burning and tingling sensation in feet, worse at bedtime, controlled with use of gabapentin '300mg'$  at hs. 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 Completed foot exam today: diminished vibratory sensation. Skin intact.  Advised about importance of proper skin care and foot wear Maintain gabapentin dose

## 2021-12-21 DIAGNOSIS — E1142 Type 2 diabetes mellitus with diabetic polyneuropathy: Secondary | ICD-10-CM | POA: Diagnosis not present

## 2021-12-21 DIAGNOSIS — E1169 Type 2 diabetes mellitus with other specified complication: Secondary | ICD-10-CM

## 2021-12-21 DIAGNOSIS — E785 Hyperlipidemia, unspecified: Secondary | ICD-10-CM

## 2021-12-22 ENCOUNTER — Telehealth: Payer: Self-pay | Admitting: Nurse Practitioner

## 2021-12-22 NOTE — Telephone Encounter (Signed)
Called & spoke w/ Pt, says he received a call from the cardiologist to schedule an appt to look at his heart & he is still waiting to hear back from the ENT. Adv to give them a week & to call them if he doesn't hear from them within the next week or so.

## 2021-12-22 NOTE — Telephone Encounter (Signed)
Caller Name: Koleton Duchemin Call back phone #: [phone]  Reason for Call: Please give pt a call back to go over referral to ENT

## 2021-12-28 ENCOUNTER — Telehealth: Payer: Self-pay | Admitting: Nurse Practitioner

## 2021-12-28 NOTE — Telephone Encounter (Signed)
Called & spoke w/ pt. Provided pt with ENT's phone number so he can called and get his appt scheduled.

## 2021-12-28 NOTE — Telephone Encounter (Signed)
Caller Name: [name] Call back phone #: [phone]  Reason for Call: Pt has a referral to [facility], asked for a call back to go over some questions he had

## 2022-01-06 ENCOUNTER — Ambulatory Visit (HOSPITAL_COMMUNITY): Payer: PPO | Attending: Cardiology

## 2022-01-06 DIAGNOSIS — I2510 Atherosclerotic heart disease of native coronary artery without angina pectoris: Secondary | ICD-10-CM | POA: Insufficient documentation

## 2022-01-06 DIAGNOSIS — I77810 Thoracic aortic ectasia: Secondary | ICD-10-CM | POA: Diagnosis present

## 2022-01-06 DIAGNOSIS — E119 Type 2 diabetes mellitus without complications: Secondary | ICD-10-CM | POA: Insufficient documentation

## 2022-01-06 DIAGNOSIS — E785 Hyperlipidemia, unspecified: Secondary | ICD-10-CM | POA: Insufficient documentation

## 2022-01-06 LAB — ECHOCARDIOGRAM COMPLETE
Area-P 1/2: 2.87 cm2
S' Lateral: 2.7 cm

## 2022-01-07 ENCOUNTER — Other Ambulatory Visit: Payer: Self-pay | Admitting: Nurse Practitioner

## 2022-01-07 DIAGNOSIS — K2100 Gastro-esophageal reflux disease with esophagitis, without bleeding: Secondary | ICD-10-CM

## 2022-01-07 NOTE — Telephone Encounter (Signed)
Chart supports Rx Last OV: 11/2021 Next OV:  03/2022

## 2022-01-20 ENCOUNTER — Encounter: Payer: Self-pay | Admitting: Nurse Practitioner

## 2022-01-20 ENCOUNTER — Telehealth: Payer: Self-pay | Admitting: Nurse Practitioner

## 2022-01-20 NOTE — Telephone Encounter (Signed)
Called & spoke w/ pt, says he is still having pain in his left knee, has not been seen or had an x-ray on it since beginning of the year. Offered pt an appt, will be seeing lauren on 01/21/22.

## 2022-01-20 NOTE — Progress Notes (Unsigned)
   Acute Office Visit  Subjective:     Patient ID: Nicholas Rasmussen, male    DOB: December 27, 1945, 76 y.o.   MRN: [id number]  No chief complaint on file.   HPI Patient is in today for left knee pain.  KNEE PAIN  Duration: {Blank single:19197::"chronic","days","weeks","months"} Involved knee: {Blank single:19197::"left","right","bilateral"} Mechanism of injury: {Blank single:19197::"trauma","unknown"} Location:{Blank single:19197::"anterior","posterior","lateral","medial","diffuse"} Onset: {Blank single:19197::"sudden","gradual"} Severity: {Blank single:19197::"mild","moderate","severe","1/10","2/10","3/10","4/10","5/10","6/10","7/10","8/10","9/10","10/10"}  Quality:  {Blank multiple:19196::"sharp","dull","aching","burning","cramping","ill-defined","itchy","pressure-like","pulling","shooting","sore","stabbing","tender","tearing","throbbing"} Frequency: {Blank single:19197::"constant","intermittent","occasional","rare","every few minutes","a few times a hour","a few times a day","a few times a week","a few times a month","a few times a year"} Radiation: {Blank single:19197::"yes","no"} Aggravating factors: {Blank multiple:19196::"weight bearing","walking","running","stairs","bending","movement","prolonged sitting"}  Alleviating factors: {Blank multiple:19196::"nothing","ice","physical therapy","HEP","APAP","NSAIDs","brace","crutches","rest"}  Status: {Blank multiple:19196::"better","worse","stable","fluctuating"} Treatments attempted: {Blank multiple:19196::"none","rest","ice","heat","APAP","ibuprofen","aleve","physical therapy","HEP"}  Relief with NSAIDs?:  {Blank single:19197::"No NSAIDs Taken","no","mild","moderate","significant"} Weakness with weight bearing or walking: {Blank single:19197::"yes","no"} Sensation of giving way: {Blank single:19197::"yes","no"} Locking: {Blank single:19197::"yes","no"} Popping: {Blank single:19197::"yes","no"} Bruising: {Blank  single:19197::"yes","no"} Swelling: {Blank single:19197::"yes","no"} Redness: {Blank single:19197::"yes","no"} Paresthesias/decreased sensation: {Blank single:19197::"yes","no"} Fevers: {Blank single:19197::"yes","no"}  ROS      Objective:    There were no vitals taken for this visit. {Vitals History (Optional):23777}  Physical Exam  No results found for any visits on 01/21/22.      Assessment & Plan:   Problem List Items Addressed This Visit   None   No orders of the defined types were placed in this encounter.   No follow-ups on file.  Charyl Dancer, NP

## 2022-01-20 NOTE — Telephone Encounter (Signed)
Caller Name: Angela Platner Call back phone #: [phone]  Reason for Call: Please call pt to speak about possible referral to Orthopedic. He states that his knee has not gotten any better since last visit

## 2022-01-21 ENCOUNTER — Encounter: Payer: Self-pay | Admitting: Nurse Practitioner

## 2022-01-21 ENCOUNTER — Ambulatory Visit (INDEPENDENT_AMBULATORY_CARE_PROVIDER_SITE_OTHER): Payer: PPO | Admitting: Nurse Practitioner

## 2022-01-21 VITALS — BP 128/70 | HR 52 | Temp 97.3°F | Wt 245.8 lb

## 2022-01-21 DIAGNOSIS — G8929 Other chronic pain: Secondary | ICD-10-CM

## 2022-01-21 DIAGNOSIS — M25562 Pain in left knee: Secondary | ICD-10-CM | POA: Diagnosis not present

## 2022-01-21 NOTE — Assessment & Plan Note (Signed)
He has been having intermittent chronic pain in his left knee since 11 years ago after meniscus injury.  He states that he did have surgery on it in the 1990s.  He can continue to use the sleeve to help with pain.  We will also give him stretches to do daily.  He had an x-ray which showed joint space narrowing.  With ongoing pain, will check an MRI of his left knee.  Follow-up after MRI with PCP.

## 2022-01-21 NOTE — Patient Instructions (Signed)
It was great to see you!  I have ordered a MRI on your left knee. I have attached some stretches for you to do daily. Keep wearing the sleeve.   Let's follow-up after your MRI with your PCP, sooner if you have concerns.  If a referral was placed today, you will be contacted for an appointment. Please note that routine referrals can sometimes take up to 3-4 weeks to process. Please call our office if you haven't heard anything after this time frame.  Take care,  Vance Peper, NP

## 2022-01-28 ENCOUNTER — Ambulatory Visit
Admission: RE | Admit: 2022-01-28 | Discharge: 2022-01-28 | Disposition: A | Discharge status: Home / Self Care | Payer: PPO | Source: Ambulatory Visit | Attending: Nurse Practitioner | Admitting: Nurse Practitioner

## 2022-01-28 DIAGNOSIS — G8929 Other chronic pain: Secondary | ICD-10-CM

## 2022-01-28 LAB — HM DIABETES EYE EXAM

## 2022-02-01 ENCOUNTER — Other Ambulatory Visit: Payer: Self-pay | Admitting: Nurse Practitioner

## 2022-02-01 DIAGNOSIS — S83242S Other tear of medial meniscus, current injury, left knee, sequela: Secondary | ICD-10-CM

## 2022-02-04 ENCOUNTER — Encounter: Payer: Self-pay | Admitting: Orthopaedic Surgery

## 2022-02-04 ENCOUNTER — Ambulatory Visit: Payer: PPO | Admitting: Orthopaedic Surgery

## 2022-02-04 DIAGNOSIS — S83242A Other tear of medial meniscus, current injury, left knee, initial encounter: Secondary | ICD-10-CM | POA: Diagnosis not present

## 2022-02-04 DIAGNOSIS — M1712 Unilateral primary osteoarthritis, left knee: Secondary | ICD-10-CM

## 2022-02-04 NOTE — Progress Notes (Signed)
Office Visit Note   Patient: Nicholas Rasmussen           Date of Birth: 1945/09/17           MRN: [id number] Visit Date: 02/04/2022              Requested by: Flossie Buffy, NP [place],  [place] [address] PCP: Flossie Buffy, NP   Assessment & Plan: Visit Diagnoses:  1. Primary osteoarthritis of left knee   2. Acute medial meniscus tear of left knee, initial encounter     Plan: MRI shows advanced chondromalacia of the medial compartment as well as a complex tear of the medial meniscus.  Symptomatically speaking he is doing okay and would like to hold off on injection for now.  He would like to try an OA reaction brace during activity.  We talked about various treatment options and he will think about them.  Follow-up as needed.  Follow-Up Instructions: No follow-ups on file.   Orders:  No orders of the defined types were placed in this encounter.  No orders of the defined types were placed in this encounter.     Procedures: No procedures performed   Clinical Data: No additional findings.   Subjective: Chief Complaint  Patient presents with   Left Knee - Pain    HPI Bern is a very pleasant and active 76 year old gentleman here for evaluation of left knee pain for a long time.  Got worse recently in the last month.  Very active and is ballroom dancing.  He had a left knee scope with medial meniscectomy over 10 years ago in [place].  Not taking any medications.  Recently had MRI and here to go over results and treatment options. Review of Systems  Constitutional: Negative.   All other systems reviewed and are negative.    Objective: Vital Signs: There were no vitals taken for this visit.  Physical Exam Vitals and nursing note reviewed.  Constitutional:      Appearance: He is well-developed.  HENT:     Head: Normocephalic and atraumatic.  Eyes:     Pupils: Pupils are equal, round, and reactive to light.  Pulmonary:     Effort:  Pulmonary effort is normal.  Abdominal:     Palpations: Abdomen is soft.  Musculoskeletal:        General: Normal range of motion.     Cervical back: Neck supple.  Skin:    General: Skin is warm.  Neurological:     Mental Status: He is alert and oriented to person, place, and time.  Psychiatric:        Behavior: Behavior normal.        Thought Content: Thought content normal.        Judgment: Judgment normal.     Ortho Exam Examination left knee shows medial joint line tenderness.  Collaterals and cruciates are stable.  No joint effusion.  Normal range of motion. Specialty Comments:  No specialty comments available.  Imaging: No results found.   PMFS History: Patient Active Problem List   Diagnosis Date Noted   Acute medial meniscus tear of left knee 02/04/2022   Primary osteoarthritis of left knee 02/04/2022   Aortic root dilatation (HCC) 12/18/2021   Slow transit constipation 12/08/2021   Chest wall muscle strain 12/08/2021   Multiple pulmonary nodules 10/06/2021   Hyperlipidemia associated with type 2 diabetes mellitus ([place]) 08/19/2021   [name] 04/07/2021  Complex renal cyst 04/07/2021   Chronic right-sided low back pain without sciatica 03/31/2020   Varicose veins of both lower extremities with pain 02/02/2019   Encounter for therapeutic drug level monitoring 09/19/2018   Type 2 diabetes mellitus with diabetic polyneuropathy, without long-term current use of insulin ([place]) 03/29/2018   Diabetic neuropathy ([place]) 03/29/2018   Chronic pain of left knee 03/23/2018   CAD (coronary artery disease) 08/03/2017   Post herpetic neuralgia 07/29/2016   Dizziness 01/02/2016   Chronic midline low back pain without sciatica 06/06/2015   Nonintractable episodic headache 03/13/2015   GERD (gastroesophageal reflux disease) 11/15/2014   Seizure disorder ([place]) 05/07/2014   Allergic rhinitis 05/07/2014   Past Medical History:  Diagnosis  Date   Allergic rhinitis 05/07/2014   Diabetes mellitus without complication (HCC)    Dizziness 01/02/2016   GERD (gastroesophageal reflux disease)    Headache 03/13/2015   Left flank pain 01/02/2016   Low back pain 06/06/2015   Pain of left side of body 08/13/2015   Post herpetic neuralgia    Routine general medical examination at a health care facility 05/07/2014   Seizure disorder ([place]) 05/07/2014   Seizures ([place])    Last seizure in the 70s.   Shingles    Stye 05/07/2014    Family History  Problem Relation Age of Onset   Diabetes Father    Hypertension Father    Other Mother        natural causes    Past Surgical History:  Procedure Laterality Date   COLONOSCOPY     KNEE SURGERY Left    left miniscus   UPPER GASTROINTESTINAL ENDOSCOPY     uvala surgery     Social History   Occupational History   Occupation: Retired    Comment: Oceanographer  Tobacco Use   Smoking status: Former   Smokeless tobacco: Never  Scientific laboratory technician Use: Never used  Substance and Sexual Activity   Alcohol use: Yes    Alcohol/week: 0.0 standard drinks of alcohol    Comment: occasional   Drug use: No   Sexual activity: Not on file

## 2022-02-05 ENCOUNTER — Telehealth: Payer: Self-pay | Admitting: Orthopaedic Surgery

## 2022-02-05 NOTE — Telephone Encounter (Signed)
Pt called requesting a knee brace. Please call pt at [phone] if he can pick one up in office. Pt phone number is [phone]

## 2022-02-05 NOTE — Telephone Encounter (Signed)
Waiting on OA reaction braces and I'll call once they arrive.

## 2022-02-08 NOTE — Telephone Encounter (Signed)
Called patient. He will come in tomorrow for a brace.

## 2022-02-09 ENCOUNTER — Ambulatory Visit: Payer: PPO

## 2022-02-17 ENCOUNTER — Ambulatory Visit: Payer: PPO | Admitting: Orthopaedic Surgery

## 2022-02-17 ENCOUNTER — Encounter: Payer: Self-pay | Admitting: Orthopaedic Surgery

## 2022-02-17 DIAGNOSIS — M1712 Unilateral primary osteoarthritis, left knee: Secondary | ICD-10-CM | POA: Diagnosis not present

## 2022-02-17 DIAGNOSIS — S83242A Other tear of medial meniscus, current injury, left knee, initial encounter: Secondary | ICD-10-CM | POA: Diagnosis not present

## 2022-02-17 NOTE — Progress Notes (Signed)
Office Visit Note   Patient: Nicholas Rasmussen           Date of Birth: 1945-06-18           MRN: [id number] Visit Date: 02/17/2022              Requested by: Flossie Buffy, NP [place],  [place] [address] PCP: Flossie Buffy, NP   Assessment & Plan: Visit Diagnoses:  1. Primary osteoarthritis of left knee   2. Acute medial meniscus tear of left knee, initial encounter     Plan: We spoke at length today about his treatment options.  He does have medial meniscal tear but also grade 4 changes in the medial compartment.  Per the MRI there is no chondromalacia in the other compartments.  Given these findings I think an arthroscopic surgery would be unreliable for pain relief and he would be better fit for either a partial or total knee replacement.  Currently his symptoms are severe or chronic and does not significantly affect his ADLs.  He will continue symptomatic treatment for now.  We will see him back as needed.  Total face to face encounter time was greater than 25 minutes and over half of this time was spent in counseling and/or coordination of care.  Follow-Up Instructions: No follow-ups on file.   Orders:  No orders of the defined types were placed in this encounter.  No orders of the defined types were placed in this encounter.     Procedures: No procedures performed   Clinical Data: No additional findings.   Subjective: Chief Complaint  Patient presents with   Left Knee - Follow-up    HPI Nicholas Rasmussen is returning today to discuss further about his treatment options for his left knee.  States the pain still infrequent and comes and goes.  He is able to dance.  The brace helps.  Review of Systems   Objective: Vital Signs: There were no vitals taken for this visit.  Physical Exam  Ortho Exam [place] left knee is unchanged. Specia exam lty Comments:  No specialty comments available.  Imaging: No results found.   PMFS  History: Patient Active Problem List   Diagnosis Date Noted   Acute medial meniscus tear of left knee 02/04/2022   Primary osteoarthritis of left knee 02/04/2022   Aortic root dilatation (HCC) 12/18/2021   Slow transit constipation 12/08/2021   Chest wall muscle strain 12/08/2021   Multiple pulmonary nodules 10/06/2021   Hyperlipidemia associated with type 2 diabetes mellitus ([place]) 08/19/2021   [name] 04/07/2021   Complex renal cyst 04/07/2021   Chronic right-sided low back pain without sciatica 03/31/2020   Varicose veins of both lower extremities with pain 02/02/2019   Encounter for therapeutic drug level monitoring 09/19/2018   Type 2 diabetes mellitus with diabetic polyneuropathy, without long-term current use of insulin ([place]) 03/29/2018   Diabetic neuropathy ([place]) 03/29/2018   Chronic pain of left knee 03/23/2018   CAD (coronary artery disease) 08/03/2017   Post herpetic neuralgia 07/29/2016   Dizziness 01/02/2016   Chronic midline low back pain without sciatica 06/06/2015   Nonintractable episodic headache 03/13/2015   GERD (gastroesophageal reflux disease) 11/15/2014   Seizure disorder ([place]) 05/07/2014   Allergic rhinitis 05/07/2014   Past Medical History:  Diagnosis Date   Allergic rhinitis 05/07/2014   Diabetes mellitus without complication ([place])    Dizziness 01/02/2016   GERD (gastroesophageal reflux disease)  Headache 03/13/2015   Left flank pain 01/02/2016   Low back pain 06/06/2015   Pain of left side of body 08/13/2015   Post herpetic neuralgia    Routine general medical examination at a health care facility 05/07/2014   Seizure disorder ([place]) 05/07/2014   Seizures ([place])    Last seizure in the 70s.   Shingles    Stye 05/07/2014    Family History  Problem Relation Age of Onset   Diabetes Father    Hypertension Father    Other Mother        natural causes    Past Surgical History:  Procedure Laterality Date    COLONOSCOPY     KNEE SURGERY Left    left miniscus   UPPER GASTROINTESTINAL ENDOSCOPY     uvala surgery     Social History   Occupational History   Occupation: Retired    Comment: Oceanographer  Tobacco Use   Smoking status: Former   Smokeless tobacco: Never  Scientific laboratory technician Use: Never used  Substance and Sexual Activity   Alcohol use: Yes    Alcohol/week: 0.0 standard drinks of alcohol    Comment: occasional   Drug use: No   Sexual activity: Not on file

## 2022-02-19 ENCOUNTER — Ambulatory Visit (INDEPENDENT_AMBULATORY_CARE_PROVIDER_SITE_OTHER): Payer: PPO

## 2022-02-19 ENCOUNTER — Telehealth: Payer: Self-pay | Admitting: Orthopaedic Surgery

## 2022-02-19 DIAGNOSIS — Z23 Encounter for immunization: Secondary | ICD-10-CM

## 2022-02-19 NOTE — Progress Notes (Signed)
.  After obtaining consent, and per orders of Baldo Ash, injection of Influenza Vaccine given by Augustina Mood. Patient Tolerated injection well and to report any adverse reaction to me immediately.

## 2022-02-19 NOTE — Telephone Encounter (Signed)
Called and spoke with patient.

## 2022-02-19 NOTE — Telephone Encounter (Signed)
Patient called. He says the sleeve he has is cutting into his skin. Would like to know if he could get a bigger sleeve. His call back number is [phone]

## 2022-02-23 ENCOUNTER — Ambulatory Visit: Payer: PPO | Attending: Physician Assistant | Admitting: Physician Assistant

## 2022-02-23 ENCOUNTER — Encounter: Payer: Self-pay | Admitting: Physician Assistant

## 2022-02-23 VITALS — BP 150/85 | HR 54 | Ht 76.0 in | Wt 248.4 lb

## 2022-02-23 DIAGNOSIS — E119 Type 2 diabetes mellitus without complications: Secondary | ICD-10-CM

## 2022-02-23 DIAGNOSIS — E785 Hyperlipidemia, unspecified: Secondary | ICD-10-CM | POA: Diagnosis not present

## 2022-02-23 DIAGNOSIS — I77810 Thoracic aortic ectasia: Secondary | ICD-10-CM

## 2022-02-23 DIAGNOSIS — R142 Eructation: Secondary | ICD-10-CM

## 2022-02-23 DIAGNOSIS — K219 Gastro-esophageal reflux disease without esophagitis: Secondary | ICD-10-CM

## 2022-02-23 DIAGNOSIS — I25119 Atherosclerotic heart disease of native coronary artery with unspecified angina pectoris: Secondary | ICD-10-CM

## 2022-02-23 NOTE — Patient Instructions (Addendum)
Medication Instructions:  Your physician recommends that you continue on your current medications as directed. Please refer to the Current Medication list given to you today.  *If you need a refill on your cardiac medications before your next appointment, please call your pharmacy*   Lab Work: None   If you have labs (blood work) drawn today and your tests are completely normal, you will receive your results only by: [place] (if you have MyChart) OR A paper copy in the mail If you have any lab test that is abnormal or we need to change your treatment, we will call you to review the results.   Testing/Procedures: None  Follow-Up: At [facility], you and your health needs are our priority.  As part of our continuing mission to provide you with exceptional heart care, we have created designated Provider Care Teams.  These Care Teams include your primary Cardiologist (physician) and Advanced Practice Providers (APPs -  Physician Assistants and Nurse Practitioners) who all work together to provide you with the care you need, when you need it.  We recommend signing up for the patient portal called "MyChart".  Sign up information is provided on this After Visit Summary.  MyChart is used to connect with patients for Virtual Visits (Telemedicine).  Patients are able to view lab/test results, encounter notes, upcoming appointments, etc.  Non-urgent messages can be sent to your provider as well.   To learn more about what you can do with MyChart, go to [email or web address].    Your next appointment:   12 month(s)  The format for your next appointment:   In Person  Provider:   Lenna Sciara, MD to establish care  You have been referred to Gastroenterology Monitor your Blood pressure and keep a record for 2 weeks. Send [place] your readings through MyChart.  Important Information About [place]

## 2022-02-23 NOTE — Progress Notes (Signed)
Cardiology Office Note:    Date:  02/23/2022   ID:  Nicholas Rasmussen, DOB [birth year], MRN [id number]  PCP:  Flossie Buffy, NP  [facility] Providers Cardiologist:  Sinclair Grooms, MD     Referring MD: Flossie Buffy, NP   Chief Complaint:  Follow-up     History of Present Illness:   Nicholas Rasmussen is a 76 y.o. male with a hx of vague chest discomfort and moderate risk coronary artery calcification score 156. Coincidentally found ascending aortic aneurysm, 44 mm CT chest 11/2020.  Last saw Dr. Tamala Julian 11/2020 and doing well-ballroom dancing. Echo 12/2021 aorta 43 mm.  Patient comes in for f/u. Still ballroom dancing with smooth grooves and travels.  Occasional has indigestion type chest pain after he eats something he shouldn't and does a lot of belching. Takes Mylanta and prilosec. No chest pain with dancing or other exertion.     Past Medical History:  Diagnosis Date   Allergic rhinitis 05/07/2014   Diabetes mellitus without complication ([place])    Dizziness 01/02/2016   GERD (gastroesophageal reflux disease)    Headache 03/13/2015   Left flank pain 01/02/2016   Low back pain 06/06/2015   Pain of left side of body 08/13/2015   Post herpetic neuralgia    Routine general medical examination at a health care facility 05/07/2014   Seizure disorder ([place]) 05/07/2014   Seizures ([place])    Last seizure in the 70s.   Shingles    Stye 05/07/2014   Current Medications: Current Meds  Medication Sig   acetaminophen (TYLENOL) 500 MG tablet Take 1 tablet (500 mg total) by mouth every 8 (eight) hours as needed.   acyclovir (ZOVIRAX) 200 MG capsule Take 1 capsule (200 mg total) by mouth 5 (five) times daily. (Patient taking differently: Take 200 mg by mouth daily as needed.)   atorvastatin (LIPITOR) 20 MG tablet Take 1 tablet (20 mg total) by mouth daily at 6 PM.   clotrimazole (LOTRIMIN) 1 % cream APPLY TO AFFECTED AREA TWICE A DAY   diclofenac Sodium (VOLTAREN) 1 % GEL  Apply a small grape sized dollop to sore joint up to every 6 hours as needed   divalproex (DEPAKOTE ER) 500 MG 24 hr tablet Take 1 tablet (500 mg total) by mouth at bedtime.   gabapentin (NEURONTIN) 300 MG capsule TAKE 1 CAPSULE BY MOUTH EVERYDAY AT BEDTIME   glipiZIDE (GLUCOTROL XL) 5 MG 24 hr tablet Take 1 tablet (5 mg total) by mouth daily with breakfast.   ibuprofen (ADVIL) 800 MG tablet Take 1 tablet (800 mg total) by mouth 3 (three) times daily.   meclizine (ANTIVERT) 25 MG tablet Take 1 tablet (25 mg total) by mouth 3 (three) times daily as needed.   metoprolol succinate (TOPROL-XL) 25 MG 24 hr tablet Take 1 tablet (25 mg total) by mouth daily.   omeprazole (PRILOSEC) 20 MG capsule TAKE 1 CAPSULE BY MOUTH EVERY DAY AS NEEDED    Allergies:   Patient has no known allergies.   Social History   Tobacco Use   Smoking status: Former   Smokeless tobacco: Never  Scientific laboratory technician Use: Never used  Substance Use Topics   Alcohol use: Yes    Alcohol/week: 0.0 standard drinks of alcohol    Comment: occasional   Drug use: No    Family Hx: The patient's family history includes Diabetes in his father; Hypertension in his father; Other in his mother.  ROS  EKGs/Labs/Other Test Reviewed:    EKG:  EKG is   ordered today.  The ekg ordered today demonstrates sinus bradycardia 54/m left axis, no change  Recent Labs: 06/05/2021: Hemoglobin 12.9; Platelets 200.0 06/12/2021: ALT 6 12/18/2021: BUN 17; Creatinine, Ser 1.15; Potassium 4.0; Sodium 139   Recent Lipid Panel Recent Labs    06/12/21 0828  CHOL 137  TRIG 117.0  HDL 45.70  VLDL 23.4  LDLCALC 68     Prior CV Studies:  Echo 12/2021 IMPRESSIONS Left ventricular ejection fraction, by estimation, is 55 to 60%. The left ventricle has normal function. The left ventricle has no regional wall motion abnormalities. There is moderate concentric left ventricular hypertrophy. Left ventricular diastolic parameters are consistent with  Grade II diastolic dysfunction (pseudonormalization). 1. Right ventricular systolic function is normal. The right ventricular size is normal. Tricuspid regurgitation signal is inadequate for assessing PA pressure. 2. The mitral valve is normal in structure. No evidence of mitral valve regurgitation. No evidence of mitral stenosis. 3. The aortic valve is tricuspid. Aortic valve regurgitation is not visualized. No aortic stenosis is present. 4. Aortic dilatation noted. There is moderate dilatation of the aortic root, measuring 43 mm. 5. The inferior vena cava is normal in size with greater than 50% respiratory variability, suggesting right atrial pressure of 3 mmHg. 6. FINDINGS Left Ventricle: Left ventricular ejection fraction, by estimation, is 55 to 60%. The left ventricle has normal function. The left ventricle has no regional wall motion abnormalities. The left ventricular internal cavity size was normal in size. There is moderate concentric left ventricular hypertrophy. Left ventricular diastolic parameters are consistent with Grade II diastolic dysfunction (pseudonormalization). Right Ventricle: The right ventricular size is normal. No increase in right ventricular wall thickness. Right ventricular systolic function is normal. Tricuspid regurgitation signal is inadequate for assessing PA Final Page 1 of CT CORONARY MORPH W/CTA COR W/SCORE W/CA W/CM &/OR WO/CM 06/22/2017  Addendum 06/23/2017  8:17 AM ADDENDUM REPORT: 06/23/2017 08:15  CLINICAL DATA:  76 year old male with chest pain.  EXAM: Cardiac/Coronary  CT  TECHNIQUE: The patient was scanned on a [employer].  FINDINGS: A 120 kV prospective scan was triggered in the descending thoracic aorta at 111 HU's. Axial non-contrast 3 mm slices were carried out through the heart. The data set was analyzed on a dedicated work station and scored using the [place]. Gantry rotation speed was 250 msecs and  collimation was .6 mm. No beta blockade and 0.8 mg of sl NTG was given. The 3D data set was reconstructed in 5% intervals of the 67-82 % of the R-R cycle. Diastolic phases were analyzed on a dedicated work station using MPR, MIP and VRT modes. The patient received 80 cc of contrast.  Aorta: Normal size. Minimal calcifications and atherosclerotic plaque. No dissection.  Aortic Valve: Trileaflet. No calcifications. Dilated aortic root at the sinus level with maximum diameter 44 mm.  Coronary Arteries:  Normal coronary origin.  Right dominance.  RCA is a large dominant artery that gives rise to PDA and PLVB. There is mild calcified plaque in the mid RCA with associated stenosis 25-50%.  Left main is a large and long vessel that has mild eccentric calcified plaque in the distal portion with associated stenosis 0-25%. LM gives rise to LAD and LCX arteries.  LAD is a large vessel that has mild diffuse predominantly calcified plaque in the proximal to mid portion with maximum stenosis 25-50%. LAD gives rise to two fairly small diagonal arteries.  D1  has minimal plaque.  D2 has minimal plaque.  LCX is a non-dominant artery that gives rise to one OM1 branch. There is minimal non-calcified plaque.  Other findings:  Normal pulmonary vein drainage into the left atrium.  Normal let atrial appendage without a thrombus.  Normal size of the pulmonary artery.  IMPRESSION: 1. Coronary calcium score of 151. This was 76 percentile for age and sex matched control.  2. Normal coronary origin with right dominance.  3. Mild CAD in the distal left main, proximal to mid LAD and mid RCA. Aggressive risk factor modification is recommended.  4. Dilated aortic root at the sinus level with maximum diameter 44 mm.   Electronically Signed By: Ena Dawley On: 06/23/2017 08:15  Narrative EXAM: OVER-READ INTERPRETATION  CT CHEST  The following report is an over-read performed by  radiologist Dr. Vinnie Langton of [facility], [place] on 06/22/2017. This over-read does not include interpretation of cardiac or coronary anatomy or pathology. The coronary calcium score/coronary CTA interpretation by the cardiologist is attached.  COMPARISON:  None.  FINDINGS: Aortic atherosclerosis. Within the visualized portions of the thorax there are no suspicious appearing pulmonary nodules or masses, there is no acute consolidative airspace disease, no pleural effusions, no pneumothorax and no lymphadenopathy. Visualized portions of the upper abdomen are unremarkable. There are no aggressive appearing lytic or blastic lesions noted in the visualized portions of the skeleton.  IMPRESSION: 1.  Aortic Atherosclerosis (ICD10-I70.0).  Electronically Signed: By: Vinnie Langton M.D. On: 06/22/2017 09:46    CT Chest 11/25/2020:   IMPRESSION: 1. Stable uncomplicated mild fusiform ectasia of the aortic root measuring 44 mm in diameter, unchanged compared to the 05/2017 examination. 2. Coronary calcifications.  Aortic Atherosclerosis (ICD10-I70.0). 3. Punctate (sub 0.5 cm) pulmonary nodules are unchanged compared to the 11/2019 examination. This examination documents 1 year of stability. Follow-up examination in 11/2021 would ensure 2 years of stability and thus a benign etiology. 4.  Emphysema (ICD10-J43.9).      Risk Assessment/Calculations/Metrics:         HYPERTENSION CONTROL Vitals:   02/23/22 1249 02/23/22 1308  BP: (!) 140/86 (!) 150/85    The patient's blood pressure is elevated above target today.  In order to address the patient's elevated BP: Blood pressure will be monitored at home to determine if medication changes need to be made.       Physical Exam:    VS:  BP (!) 150/85   Pulse (!) 54   Ht '6\' 4"'$  (1.93 m)   Wt 248 lb 6 oz (112.7 kg)   SpO2 96%   BMI 30.23 kg/m     Wt Readings from Last 3 Encounters:  02/23/22 248 lb 6 oz (112.7 kg)   01/21/22 245 lb 12.8 oz (111.5 kg)  12/18/21 246 lb 3.2 oz (111.7 kg)    Physical Exam  GEN: Well nourished, well developed, in no acute distress  Neck: no JVD, carotid bruits, or masses Cardiac:RRR; no murmurs, rubs, or gallops  Respiratory:  clear to auscultation bilaterally, normal work of breathing GI: soft, nontender, nondistended, + BS Ext: without cyanosis, clubbing, or edema, Good distal pulses bilaterally Neuro:  Alert and Oriented x 3,  Psych: euthymic mood, full affect       ASSESSMENT & PLAN:   No problem-specific Assessment & Plan notes found for this encounter.   CAD with calcium score 151 and mild CAD distal LM, mLAD and mRCA CCTA 05/2017. No chest pain. On toprol 12.5 mg. Would  restart ASA 81 mg daily.  Aortic root dilatation 44 mm 11/2020 43 mm on echo 01/06/22  HTN BP up some but usually 118/70. Was 128/70 at [facility] 01[facility]21[facility]. Patient will monitor it for 2 weeks and send [place] a message with readings. May need another agent as metoprolol decreased for bradycardia. He watches salt closely  HLD LDL 68 05/2021  DM2 A1C 6.5 11/2021  GERD-has a lot of reflux and belching. Seeing ENT this month. If ok would see GI.-will put referral in.          Dispo:  No follow-ups on file.   Medication Adjustments/Labs and Tests Ordered: Current medicines are reviewed at length with the patient today.  Concerns regarding medicines are outlined above.  Tests Ordered: Orders Placed This Encounter  Procedures   EKG 12-Lead   Medication Changes: No orders of the defined types were placed in this encounter.  Signed, Ermalinda Barrios, PA-C  02/23/2022 1:21 PM    [address], [place], [place]  [address] Phone: [phone]; Fax: [phone]

## 2022-02-26 ENCOUNTER — Encounter: Payer: Self-pay | Admitting: Physician Assistant

## 2022-03-02 ENCOUNTER — Other Ambulatory Visit: Payer: Self-pay | Admitting: Nurse Practitioner

## 2022-03-02 DIAGNOSIS — G40909 Epilepsy, unspecified, not intractable, without status epilepticus: Secondary | ICD-10-CM

## 2022-03-03 ENCOUNTER — Other Ambulatory Visit: Payer: Self-pay

## 2022-03-03 DIAGNOSIS — G40909 Epilepsy, unspecified, not intractable, without status epilepticus: Secondary | ICD-10-CM

## 2022-03-03 DIAGNOSIS — J300 Vasomotor rhinitis: Secondary | ICD-10-CM | POA: Insufficient documentation

## 2022-03-03 MED ORDER — DIVALPROEX SODIUM ER 500 MG PO TB24: 500.0000 mg | ORAL_TABLET | Freq: Every day | ORAL | 1 refills | Status: DC

## 2022-03-03 NOTE — Telephone Encounter (Signed)
..  Chart supports rx  Last ov:01/21/22  Next ov: 04/08/22  Last refill: 12/04/21

## 2022-03-04 ENCOUNTER — Other Ambulatory Visit: Payer: Self-pay | Admitting: Nurse Practitioner

## 2022-03-04 ENCOUNTER — Other Ambulatory Visit: Payer: Self-pay | Admitting: Family Medicine

## 2022-03-04 DIAGNOSIS — I25119 Atherosclerotic heart disease of native coronary artery with unspecified angina pectoris: Secondary | ICD-10-CM

## 2022-03-04 DIAGNOSIS — E1169 Type 2 diabetes mellitus with other specified complication: Secondary | ICD-10-CM

## 2022-03-04 DIAGNOSIS — K5901 Slow transit constipation: Secondary | ICD-10-CM

## 2022-03-08 NOTE — Telephone Encounter (Signed)
Chart supports Rx Last OV: 12/2021 Next OV: 03/2022  

## 2022-03-11 ENCOUNTER — Telehealth: Payer: Self-pay | Admitting: Nurse Practitioner

## 2022-03-11 NOTE — Telephone Encounter (Signed)
Pt advised.

## 2022-03-11 NOTE — Telephone Encounter (Signed)
Caller Name: Codey Burling Call back phone #: [phone]  Reason for Call: Has had a persistent cough with no other symptoms. He has not had a cold or any feelings of sickness. Please call with recommendations.

## 2022-03-31 ENCOUNTER — Encounter: Payer: Self-pay | Admitting: Physician Assistant

## 2022-03-31 ENCOUNTER — Ambulatory Visit: Payer: PPO | Admitting: Physician Assistant

## 2022-03-31 VITALS — BP 120/78 | HR 47 | Ht 76.0 in | Wt 252.8 lb

## 2022-03-31 DIAGNOSIS — K2100 Gastro-esophageal reflux disease with esophagitis, without bleeding: Secondary | ICD-10-CM | POA: Diagnosis not present

## 2022-03-31 DIAGNOSIS — R142 Eructation: Secondary | ICD-10-CM

## 2022-03-31 DIAGNOSIS — K5900 Constipation, unspecified: Secondary | ICD-10-CM

## 2022-03-31 MED ORDER — OMEPRAZOLE 20 MG PO CPDR: 20.0000 mg | DELAYED_RELEASE_CAPSULE | Freq: Two times a day (BID) | ORAL | 11 refills | Status: DC

## 2022-03-31 NOTE — Progress Notes (Signed)
Chief Complaint: GERD and constipation  HPI:    Mr. Thurner is a 76 year old African-American male with a past medical history of diabetes, GERD, seizures and multiple others listed below, known to Dr. Carlean Purl, who was referred to me by Imogene Burn, PA-C for a complaint of GERD and constipation.      2015 colonoscopy reviewed and patient told he needed a recall 01/2024.    01/12/2018 office visit with Dr. Carlean Purl.  At that time was following up after an EGD which demonstrated gastritis and duodenitis which improved significantly on Omeprazole 40 mg daily.  At that time told to decrease his PPI to 20 mg daily.    02/23/2022 patient followed with cardiology in regards to CAD.    Today, the patient tells me that he went to see an ENT in regards to some belching and gas and they told him he had some postnasal drainage but really no other problems.  Describes that he takes Omeprazole 20 mg in the morning but his problem really is in the evening when he goes to lay down at night he has a lot of belching and some reflux symptoms.  Tells me he does tend to eat late and tries to sit up for a little while but not very long and when he lays down he has symptoms.  He will then take Mylanta which helps a little bit but this is almost a nightly occurrence.    Also discusses some change to constipation occasionally.  He tried over-the-counter Colace for this which worked okay.    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    Denies fever, chills, weight loss, blood in the stool, abdominal pain, nausea, vomiting or symptoms that awaken him from sleep.  Past Medical History:  Diagnosis Date   Allergic rhinitis 05/07/2014   Diabetes mellitus without complication ([place])    Dizziness 01/02/2016   GERD (gastroesophageal reflux disease)    Headache 03/13/2015   Left flank pain 01/02/2016   Low back pain 06/06/2015   Pain of left side of body 08/13/2015   Post herpetic neuralgia    Routine general  medical examination at a health care facility 05/07/2014   Seizure disorder ([place]) 05/07/2014   Seizures ([place])    Last seizure in the 70s.   Shingles    Stye 05/07/2014    Past Surgical History:  Procedure Laterality Date   COLONOSCOPY     KNEE SURGERY Left    left miniscus   UPPER GASTROINTESTINAL ENDOSCOPY     uvala surgery      Current Outpatient Medications  Medication Sig Dispense Refill   acetaminophen (TYLENOL) 500 MG tablet Take 1 tablet (500 mg total) by mouth every 8 (eight) hours as needed. 30 tablet 0   acyclovir (ZOVIRAX) 200 MG capsule Take 1 capsule (200 mg total) by mouth 5 (five) times daily. (Patient taking differently: Take 200 mg by mouth daily as needed.) 25 capsule 1   atorvastatin (LIPITOR) 20 MG tablet TAKE 1 TABLET BY MOUTH DAILY AT 6 PM. 90 tablet 3   clotrimazole (LOTRIMIN) 1 % cream APPLY TO AFFECTED AREA TWICE A DAY 60 g 1   diclofenac Sodium (VOLTAREN) 1 % GEL Apply a small grape sized dollop to sore joint up to every 6 hours as needed 150 g 1   divalproex (DEPAKOTE ER) 500 MG 24 hr tablet Take 1 tablet (500 mg total) by mouth at bedtime. 90 tablet 1   docusate sodium (  COLACE) 100 MG capsule TAKE 1 CAPSULE BY MOUTH TWICE A DAY 100 capsule 0   gabapentin (NEURONTIN) 300 MG capsule TAKE 1 CAPSULE BY MOUTH EVERYDAY AT BEDTIME 90 capsule 3   glipiZIDE (GLUCOTROL XL) 5 MG 24 hr tablet Take 1 tablet (5 mg total) by mouth daily with breakfast. 90 tablet 1   ibuprofen (ADVIL) 800 MG tablet Take 1 tablet (800 mg total) by mouth 3 (three) times daily. 21 tablet 0   meclizine (ANTIVERT) 25 MG tablet Take 1 tablet (25 mg total) by mouth 3 (three) times daily as needed. 10 tablet 0   metoprolol succinate (TOPROL-XL) 25 MG 24 hr tablet Take 1 tablet (25 mg total) by mouth daily. 90 tablet 3   omeprazole (PRILOSEC) 20 MG capsule TAKE 1 CAPSULE BY MOUTH EVERY DAY AS NEEDED 90 capsule 1   No current facility-administered medications for this visit.    Allergies as of  03/31/2022   (No Known Allergies)    Family History  Problem Relation Age of Onset   Diabetes Father    Hypertension Father    Other Mother        natural causes    Social History   Socioeconomic History   Marital status: Single    Spouse name: Not on file   Number of children: 3   Years of education: Associates   Highest education level: Not on file  Occupational History   Occupation: Retired    Comment: Oceanographer  Tobacco Use   Smoking status: Former   Smokeless tobacco: Never  Scientific laboratory technician Use: Never used  Substance and Sexual Activity   Alcohol use: Yes    Alcohol/week: 0.0 standard drinks of alcohol    Comment: occasional   Drug use: No   Sexual activity: Not on file  Other Topics Concern   Not on file  Social History Narrative   ** Merged History Encounter **       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 Right-handed. Rarely drinks caffeine.    Social Determinants of Health   Financial Resource Strain: Low Risk  (09/03/2021)   Overall Financial Resource Strain (CARDIA)    Difficulty of Paying Living Expenses: Not hard at all  Food Insecurity: No Food Insecurity (05/26/2021)   Hunger Vital Sign    Worried About Running Out of Food in the Last Year: Never true    Ran Out of Food in the Last Year: Never true  Transportation Needs: No Transportation Needs (05/26/2021)   PRAPARE - Hydrologist (Medical): No    Lack of Transportation (Non-Medical): No  Physical Activity: Sufficiently Active (05/26/2021)   Exercise Vital Sign    Days of Exercise per Week: 2 days    Minutes of Exercise per Session: 100 min  Stress: No Stress Concern Present (05/26/2021)   [place]    Feeling of Stress : Not at all  Social Connections: Moderately Integrated (05/26/2021)   Social  Connection and Isolation Panel [NHANES]    Frequency of Communication with Friends and Family: Twice a week    Frequency of Social Gatherings with Friends and Family: Twice a week    Attends Religious Services: More than 4 times per year    Active Member of [employer] or Organizations: No    Attends Archivist Meetings: Never  Marital Status: Married  Human resources officer Violence: Not At Risk (05/26/2021)   Humiliation, Afraid, Rape, and Kick questionnaire    Fear of Current or Ex-Partner: No    Emotionally Abused: No    Physically Abused: No    Sexually Abused: No    Review of Systems:    Constitutional: No weight loss, fever or chills Skin: No rash  Cardiovascular: No chest pain Respiratory: No SOB  Gastrointestinal: See HPI and otherwise negative Genitourinary: No dysuria  Neurological: No headache, dizziness or syncope Musculoskeletal: No new muscle or joint pain Hematologic: No bleeding  Psychiatric: No history of depression or anxiety   Physical Exam:  Vital signs: BP 120/78   Pulse (!) 47   Ht '6\' 4"'$  (1.93 m)   Wt 252 lb 12.8 oz (114.7 kg)   SpO2 97%   BMI 30.77 kg/m    Constitutional:   Pleasant AA male appears to be in NAD, Well developed, Well nourished, alert and cooperative Head:  Normocephalic and atraumatic. Eyes:   PEERL, EOMI. No icterus. Conjunctiva pink. Ears:  Normal auditory acuity. Neck:  Supple Throat: Oral cavity and pharynx without inflammation, swelling or lesion.  Respiratory: Respirations even and unlabored. Lungs clear to auscultation bilaterally.   No wheezes, crackles, or rhonchi.  Cardiovascular: Normal S1, S2. No MRG. Regular rate and rhythm. No peripheral edema, cyanosis or pallor.  Gastrointestinal:  Soft, nondistended, nontender. No rebound or guarding. Normal bowel sounds. No appreciable masses or hepatomegaly. Rectal:  Not performed.  Msk:  Symmetrical without gross deformities. Without edema, no deformity or joint abnormality.   Neurologic:  Alert and  oriented x4;  grossly normal neurologically.  Skin:   Dry and intact without significant lesions or rashes. Psychiatric:  Demonstrates good judgement and reason without abnormal affect or behaviors.  RELEVANT LABS AND IMAGING: CBC    Component Value Date/Time   WBC 4.9 06/05/2021 1257   RBC 4.88 06/05/2021 1257   HGB 12.9 (L) 06/05/2021 1257   HCT 40.5 06/05/2021 1257   PLT 200.0 06/05/2021 1257   MCV 83.0 06/05/2021 1257   MCH 26.6 10/15/2017 2156   MCHC 31.8 06/05/2021 1257   RDW 14.8 06/05/2021 1257   LYMPHSABS 1.5 10/31/2019 1449   MONOABS 0.5 10/31/2019 1449   EOSABS 0.1 10/31/2019 1449   BASOSABS 0.0 10/31/2019 1449    CMP     Component Value Date/Time   NA 139 12/18/2021 1338   NA 140 11/18/2020 1104   K 4.0 12/18/2021 1338   CL 103 12/18/2021 1338   CO2 31 12/18/2021 1338   GLUCOSE 80 12/18/2021 1338   BUN 17 12/18/2021 1338   BUN 20 11/18/2020 1104   CREATININE 1.15 12/18/2021 1338   CALCIUM 9.0 12/18/2021 1338   PROT 7.2 06/12/2021 0828   PROT 7.0 08/04/2017 0904   ALBUMIN 4.1 12/18/2021 1338   ALBUMIN 4.1 08/04/2017 0904   AST 16 06/12/2021 0828   ALT 6 06/12/2021 0828   ALKPHOS 52 06/12/2021 0828   BILITOT 0.3 06/12/2021 0828   BILITOT 0.4 08/04/2017 0904   GFRNONAA 73 11/13/2019 1423   GFRAA 84 11/13/2019 1423    Assessment: 1.  GERD: Chronic for the patient, recent increase in symptoms at night when he lays down in bed; likely related to diet and known gastritis 2.  Eructations: With above 3.  Constipation: Occasional instances of constipation; likely related to age +/- diet  Plan: 1.  Discussed with patient that he has known gastritis/duodenitis which is likely the  cause of his symptoms especially if he is eating late at night and laying down which does not give his body time to digest.  He verbalized understanding.  We reviewed antireflux diet and lifestyle modifications. 2.  For now increase Omeprazole to 20 mg twice  daily, 30-60 minutes before breakfast and again before dinner.  Discussed how important timing of this medication is.  Sent in #60 with 11 refills. 3.  Also discussed constipation.  Discussed how he should be drinking half of his body weight in ounces of water a day and focusing on 25 to 35 g of fiber through his diet and/or supplement.  Just doing these things would likely help his bowels a lot. 4.  Patient to follow in clinic with [place] as needed.  Ellouise Newer, PA-C [facility] 03/31/2022, 9:34 AM  Cc: Imogene Burn, PA-C

## 2022-03-31 NOTE — Patient Instructions (Addendum)
We have sent the following medications to your pharmacy for you to pick up at your convenience: Omeprazole 20 mg twice daily. Take 30-60 minutes before breakfast and dinner.  Increase fiber with diet supplement.  Increase water.  Follow up as needed.  It was a pleasure to see you today!  Thank you for trusting me with your gastrointestinal care!

## 2022-04-05 ENCOUNTER — Encounter: Payer: Self-pay | Admitting: Nurse Practitioner

## 2022-04-08 ENCOUNTER — Ambulatory Visit (INDEPENDENT_AMBULATORY_CARE_PROVIDER_SITE_OTHER): Payer: PPO | Admitting: Nurse Practitioner

## 2022-04-08 ENCOUNTER — Encounter: Payer: Self-pay | Admitting: Nurse Practitioner

## 2022-04-08 VITALS — BP 140/72 | HR 47 | Temp 95.9°F | Ht 76.0 in | Wt 255.6 lb

## 2022-04-08 DIAGNOSIS — R051 Acute cough: Secondary | ICD-10-CM

## 2022-04-08 DIAGNOSIS — E1142 Type 2 diabetes mellitus with diabetic polyneuropathy: Secondary | ICD-10-CM

## 2022-04-08 DIAGNOSIS — N281 Cyst of kidney, acquired: Secondary | ICD-10-CM

## 2022-04-08 LAB — POCT GLYCOSYLATED HEMOGLOBIN (HGB A1C): Hemoglobin A1C: 5.8 % — AB (ref 4.0–5.6)

## 2022-04-08 MED ORDER — CETIRIZINE HCL 10 MG PO TABS: 10.0000 mg | ORAL_TABLET | Freq: Every day | ORAL | 0 refills | Status: DC

## 2022-04-08 MED ORDER — BENZONATATE 200 MG PO CAPS: 200.0000 mg | ORAL_CAPSULE | Freq: Three times a day (TID) | ORAL | 0 refills | Status: DC | PRN

## 2022-04-08 NOTE — Assessment & Plan Note (Signed)
Repeat hgbA1c at 5.8% Controlled DM F/up in 55month

## 2022-04-08 NOTE — Progress Notes (Signed)
Established Patient Visit  Patient: Nicholas Rasmussen   DOB: 12-08-1945   76 y.o. Male  MRN: [id number] Visit Date: 04/08/2022  Subjective:    Chief Complaint  Patient presents with  . Office Visit    HTN/ DM/ hyperlipidemia  Pt fasting Doesn't check BP or BS C/o dry cough x 2 weeks, denies a runny nose & fever    Cough This is a new problem. The current episode started 1 to 4 weeks ago. The problem has been unchanged. The problem occurs constantly. The cough is Non-productive. Associated symptoms include nasal congestion and postnasal drip. Pertinent negatives include no chest pain, chills, ear congestion, ear pain, fever, headaches, heartburn, hemoptysis, myalgias, rash, rhinorrhea, sore throat, shortness of breath, sweats, weight loss or wheezing. The symptoms are aggravated by lying down and cold air. He has tried nothing for the symptoms. There is no history of asthma, bronchiectasis, bronchitis, COPD, emphysema, environmental allergies or pneumonia.  No problem-specific Assessment & Plan notes found for this encounter.  BP Readings from Last 3 Encounters:  04/08/22 (!) 146/80  03/31/22 120/78  02/23/22 (!) 150/85    Wt Readings from Last 3 Encounters:  04/08/22 255 lb 9.6 oz (115.9 kg)  03/31/22 252 lb 12.8 oz (114.7 kg)  02/23/22 248 lb 6 oz (112.7 kg)    Reviewed medical, surgical, and social history today  Medications: Outpatient Medications Prior to Visit  Medication Sig  . acetaminophen (TYLENOL) 500 MG tablet Take 1 tablet (500 mg total) by mouth every 8 (eight) hours as needed.  Marland Kitchen acyclovir (ZOVIRAX) 200 MG capsule Take 1 capsule (200 mg total) by mouth 5 (five) times daily. (Patient taking differently: Take 200 mg by mouth daily as needed.)  . atorvastatin (LIPITOR) 20 MG tablet TAKE 1 TABLET BY MOUTH DAILY AT 6 PM.  . clotrimazole (LOTRIMIN) 1 % cream APPLY TO AFFECTED AREA TWICE A DAY  . diclofenac Sodium (VOLTAREN) 1 % GEL Apply a small grape sized  dollop to sore joint up to every 6 hours as needed  . divalproex (DEPAKOTE ER) 500 MG 24 hr tablet Take 1 tablet (500 mg total) by mouth at bedtime.  . docusate sodium (COLACE) 100 MG capsule TAKE 1 CAPSULE BY MOUTH TWICE A DAY (Patient taking differently: Take 100 mg by mouth daily as needed.)  . gabapentin (NEURONTIN) 300 MG capsule TAKE 1 CAPSULE BY MOUTH EVERYDAY AT BEDTIME  . glipiZIDE (GLUCOTROL XL) 5 MG 24 hr tablet Take 1 tablet (5 mg total) by mouth daily with breakfast.  . ibuprofen (ADVIL) 800 MG tablet Take 1 tablet (800 mg total) by mouth 3 (three) times daily. (Patient taking differently: Take 800 mg by mouth as needed.)  . ipratropium (ATROVENT HFA) 17 MCG/ACT inhaler Inhale 2 puffs into the lungs every 8 (eight) hours as needed for wheezing.  . meclizine (ANTIVERT) 25 MG tablet Take 1 tablet (25 mg total) by mouth 3 (three) times daily as needed.  . metoprolol succinate (TOPROL-XL) 25 MG 24 hr tablet Take 1 tablet (25 mg total) by mouth daily.  Marland Kitchen omeprazole (PRILOSEC) 20 MG capsule Take 1 capsule (20 mg total) by mouth in the morning and at bedtime. Take 30-60 minutes before breakfast and dinner   No facility-administered medications prior to visit.   Reviewed past medical and social history.   ROS per HPI above      Objective:  BP (!) 146/80 (BP Location: Right  Arm, Patient Position: Sitting, Cuff Size: Normal)   Pulse (!) 47   Temp (!) 95.9 F (35.5 C) (Temporal)   Ht '6\' 4"'$  (1.93 m)   Wt 255 lb 9.6 oz (115.9 kg)   SpO2 97%   BMI 31.11 kg/m      Physical Exam Cardiovascular:     Rate and Rhythm: Normal rate and regular rhythm.     Pulses: Normal pulses.     Heart sounds: Normal heart sounds.  Pulmonary:     Effort: Pulmonary effort is normal.     Breath sounds: Normal breath sounds.  Musculoskeletal:     Right lower leg: No edema.     Left lower leg: No edema.  Lymphadenopathy:     Cervical: No cervical adenopathy.  Neurological:     Mental Status: He is  alert and oriented to person, place, and time.    Results for orders placed or performed in visit on 04/08/22  POCT glycosylated hemoglobin (Hb A1C)  Result Value Ref Range   Hemoglobin A1C 5.8 (A) 4.0 - 5.6 %      Assessment & Plan:    Problem List Items Addressed This Visit       Endocrine   Type 2 diabetes mellitus with diabetic polyneuropathy, without long-term current use of insulin ([place]) - Primary   Relevant Orders   POCT glycosylated hemoglobin (Hb A1C) (Completed)   No follow-ups on file.     [name], NP

## 2022-04-08 NOTE — Patient Instructions (Addendum)
Maintain current medications Use benzonatate and zyrtec for cough Maintain adequate oral hydration.

## 2022-04-30 ENCOUNTER — Other Ambulatory Visit: Payer: Self-pay | Admitting: Nurse Practitioner

## 2022-04-30 DIAGNOSIS — R051 Acute cough: Secondary | ICD-10-CM

## 2022-05-03 ENCOUNTER — Telehealth: Payer: Self-pay | Admitting: Nurse Practitioner

## 2022-05-03 DIAGNOSIS — R21 Rash and other nonspecific skin eruption: Secondary | ICD-10-CM

## 2022-05-03 NOTE — Telephone Encounter (Signed)
Caller Name: Halil Rentz Call back phone #: [phone]  Reason for Call: Pharmacy requested prescription

## 2022-05-05 NOTE — Telephone Encounter (Signed)
Pt called back to check on RX. Pt needing acyclovir cream (ZOVIRAX) 5 % for recurrent issue.  [id number] [address]Phone:: [phone] Fax:: [phone]  Please call back to notify if/when sent in.

## 2022-05-07 ENCOUNTER — Other Ambulatory Visit: Payer: Self-pay | Admitting: Nurse Practitioner

## 2022-05-07 MED ORDER — ACYCLOVIR 5 % EX CREA: 1.0000 | TOPICAL_CREAM | Freq: Four times a day (QID) | CUTANEOUS | 0 refills | Status: DC

## 2022-05-07 NOTE — Addendum Note (Signed)
Addended by: [name] L on: 05/07/2022 01:15 PM   Modules accepted: Orders

## 2022-05-11 ENCOUNTER — Ambulatory Visit
Admission: RE | Admit: 2022-05-11 | Discharge: 2022-05-11 | Disposition: A | Discharge status: Home / Self Care | Payer: PPO | Source: Ambulatory Visit | Attending: Nurse Practitioner | Admitting: Nurse Practitioner

## 2022-05-11 DIAGNOSIS — N281 Cyst of kidney, acquired: Secondary | ICD-10-CM

## 2022-05-11 MED ORDER — GADOPICLENOL 0.5 MMOL/ML IV SOLN
10.0000 mL | Freq: Once | INTRAVENOUS | Status: AC | PRN
  Administered 2022-05-11: 10 mL via INTRAVENOUS

## 2022-05-22 ENCOUNTER — Other Ambulatory Visit: Payer: Self-pay | Admitting: Family Medicine

## 2022-05-22 DIAGNOSIS — E1142 Type 2 diabetes mellitus with diabetic polyneuropathy: Secondary | ICD-10-CM

## 2022-05-27 ENCOUNTER — Encounter: Payer: Self-pay | Admitting: Family Medicine

## 2022-05-27 ENCOUNTER — Ambulatory Visit (INDEPENDENT_AMBULATORY_CARE_PROVIDER_SITE_OTHER): Payer: PPO | Admitting: Family Medicine

## 2022-05-27 VITALS — BP 124/70 | HR 55 | Temp 98.5°F | Ht 76.0 in | Wt 255.6 lb

## 2022-05-27 DIAGNOSIS — M7631 Iliotibial band syndrome, right leg: Secondary | ICD-10-CM | POA: Diagnosis not present

## 2022-05-27 DIAGNOSIS — S29011D Strain of muscle and tendon of front wall of thorax, subsequent encounter: Secondary | ICD-10-CM

## 2022-05-27 MED ORDER — DICLOFENAC SODIUM 1 % EX GEL: CUTANEOUS | 1 refills | Status: DC

## 2022-05-27 NOTE — Progress Notes (Signed)
Established Patient Office Visit   Subjective:  Patient ID: Nicholas Rasmussen, male    DOB: 1945/11/29  Age: 77 y.o. MRN: [id number]  Chief Complaint  Patient presents with   Pain    Pain under left nipple x 3 days.also tender at right thigh when touched.     HPI Encounter Diagnoses  Name Primary?   Iliotibial band syndrome of right side Yes   Muscle strain of chest wall, subsequent encounter    Reports left anterior rib cage pain with tenderness to palpation.  There is pain in his left lateral thigh with tenderness to palpation.  There is been no injury.  There is been no call.  Denies difficulty breathing.  There is no diaphoresis or nausea.  He is an avid Software engineer.   Review of Systems  Constitutional: Negative.  Negative for diaphoresis.  HENT: Negative.    Eyes:  Negative for blurred vision, discharge and redness.  Respiratory: Negative.  Negative for shortness of breath.   Cardiovascular: Negative.   Gastrointestinal:  Negative for abdominal pain, nausea and vomiting.  Genitourinary: Negative.   Musculoskeletal: Negative.  Negative for myalgias.  Skin:  Negative for rash.  Neurological:  Negative for tingling, loss of consciousness and weakness.  Endo/Heme/Allergies:  Negative for polydipsia.     Current Outpatient Medications:    acetaminophen (TYLENOL) 500 MG tablet, Take 1 tablet (500 mg total) by mouth every 8 (eight) hours as needed., Disp: 30 tablet, Rfl: 0   acyclovir (ZOVIRAX) 200 MG capsule, Take 1 capsule (200 mg total) by mouth 5 (five) times daily. (Patient taking differently: Take 200 mg by mouth daily as needed.), Disp: 25 capsule, Rfl: 1   acyclovir cream (ZOVIRAX) 5 %, APPLY 1 APPLICATION TOPICALLY EVERY 6 (SIX) HOURS., Disp: 5 g, Rfl: 3   atorvastatin (LIPITOR) 20 MG tablet, TAKE 1 TABLET BY MOUTH DAILY AT 6 PM., Disp: 90 tablet, Rfl: 3   clotrimazole (LOTRIMIN) 1 % cream, APPLY TO AFFECTED AREA TWICE A DAY, Disp: 60 g, Rfl: 1   divalproex  (DEPAKOTE ER) 500 MG 24 hr tablet, Take 1 tablet (500 mg total) by mouth at bedtime., Disp: 90 tablet, Rfl: 1   gabapentin (NEURONTIN) 300 MG capsule, TAKE 1 CAPSULE BY MOUTH EVERYDAY AT BEDTIME, Disp: 90 capsule, Rfl: 3   glipiZIDE (GLUCOTROL XL) 5 MG 24 hr tablet, TAKE 1 TABLET BY MOUTH EVERY DAY WITH BREAKFAST, Disp: 90 tablet, Rfl: 1   ibuprofen (ADVIL) 800 MG tablet, Take 1 tablet (800 mg total) by mouth 3 (three) times daily. (Patient taking differently: Take 800 mg by mouth as needed.), Disp: 21 tablet, Rfl: 0   ipratropium (ATROVENT) 0.03 % nasal spray, Place 1 spray into both nostrils 2 (two) times daily., Disp: , Rfl:    metoprolol succinate (TOPROL-XL) 25 MG 24 hr tablet, Take 1 tablet (25 mg total) by mouth daily., Disp: 90 tablet, Rfl: 3   omeprazole (PRILOSEC) 20 MG capsule, Take 1 capsule (20 mg total) by mouth in the morning and at bedtime. Take 30-60 minutes before breakfast and dinner, Disp: 60 capsule, Rfl: 11   cetirizine (ZYRTEC) 10 MG tablet, TAKE 1 TABLET BY MOUTH EVERYDAY AT BEDTIME (Patient not taking: Reported on 05/27/2022), Disp: 90 tablet, Rfl: 1   diclofenac Sodium (VOLTAREN) 1 % GEL, Apply a small grape sized dollop to sore joint up to every 6 hours as needed, Disp: 150 g, Rfl: 1   docusate sodium (COLACE) 100 MG capsule, TAKE 1 CAPSULE BY MOUTH  TWICE A DAY (Patient not taking: Reported on 05/27/2022), Disp: 100 capsule, Rfl: 0   Objective:     BP 124/70 (BP Location: Right Arm, Patient Position: Sitting, Cuff Size: Large)   Pulse (!) 55   Temp 98.5 F (36.9 C) (Temporal)   Ht '6\' 4"'$  (1.93 m)   Wt 255 lb 9.6 oz (115.9 kg)   SpO2 95%   BMI 31.11 kg/m    Physical Exam Constitutional:      General: He is not in acute distress.    Appearance: Normal appearance. He is not ill-appearing, toxic-appearing or diaphoretic.  HENT:     Head: Normocephalic and atraumatic.     Right Ear: External ear normal.     Left Ear: External ear normal.  Eyes:     General: No  scleral icterus.       Right eye: No discharge.        Left eye: No discharge.     Extraocular Movements: Extraocular movements intact.     Conjunctiva/sclera: Conjunctivae normal.  Cardiovascular:     Rate and Rhythm: Normal rate and regular rhythm.  Pulmonary:     Effort: Pulmonary effort is normal. No respiratory distress.     Breath sounds: Normal breath sounds.  Chest:    Musculoskeletal:     Right hip: Tenderness present. Normal range of motion. Normal strength.     Left hip: Normal range of motion. Normal strength.       Legs:  Skin:    General: Skin is warm and dry.  Neurological:     Mental Status: He is alert and oriented to person, place, and time.  Psychiatric:        Mood and Affect: Mood normal.        Behavior: Behavior normal.      No results found for any visits on 05/27/22.    The 10-year ASCVD risk score (Arnett DK, et al., 2019) is: 21.1%    Assessment & Plan:   Iliotibial band syndrome of right side -     Diclofenac Sodium; Apply a small grape sized dollop to sore joint up to every 6 hours as needed  Dispense: 150 g; Refill: 1  Muscle strain of chest wall, subsequent encounter -     DG Ribs Unilateral Left; Future -     Diclofenac Sodium; Apply a small grape sized dollop to sore joint up to every 6 hours as needed  Dispense: 150 g; Refill: 1    Return if symptoms worsen or fail to improve.  Information was given about iliotibial band syndrome.  He will use Voltaren gel as needed.  Will send for left rib detail.  Libby Maw, MD

## 2022-05-29 ENCOUNTER — Other Ambulatory Visit: Payer: Self-pay | Admitting: Nurse Practitioner

## 2022-05-29 DIAGNOSIS — I25119 Atherosclerotic heart disease of native coronary artery with unspecified angina pectoris: Secondary | ICD-10-CM

## 2022-05-31 NOTE — Telephone Encounter (Signed)
Chart supports Rx Last OV: 05/2022 Next OV: 06/2022

## 2022-06-02 ENCOUNTER — Ambulatory Visit (INDEPENDENT_AMBULATORY_CARE_PROVIDER_SITE_OTHER)
Admission: RE | Admit: 2022-06-02 | Discharge: 2022-06-02 | Disposition: A | Discharge status: Home / Self Care | Payer: PPO | Source: Ambulatory Visit | Attending: Family Medicine | Admitting: Family Medicine

## 2022-06-02 DIAGNOSIS — R0781 Pleurodynia: Secondary | ICD-10-CM | POA: Diagnosis not present

## 2022-06-02 DIAGNOSIS — S29011D Strain of muscle and tendon of front wall of thorax, subsequent encounter: Secondary | ICD-10-CM

## 2022-06-04 ENCOUNTER — Ambulatory Visit (INDEPENDENT_AMBULATORY_CARE_PROVIDER_SITE_OTHER): Payer: PPO

## 2022-06-04 VITALS — BP 122/60 | HR 54 | Temp 98.0°F | Ht 76.5 in | Wt 255.8 lb

## 2022-06-04 DIAGNOSIS — Z0000 Encounter for general adult medical examination without abnormal findings: Secondary | ICD-10-CM | POA: Diagnosis not present

## 2022-06-04 NOTE — Patient Instructions (Addendum)
[name] , Thank you for taking time to come for your Medicare Wellness Visit. I appreciate your ongoing commitment to your health goals. Please review the following plan we discussed and let me know if I can assist you in the future.   These are the goals we discussed:  Goals      I want to remain healthy as possible and travel     Continue to be active, exercise, eat healthy, enjoy life, family, and travel as much as possible     Monitor and Manage My Blood Sugar-Diabetes Type 2     Timeframe:  Long-Range Goal Priority:  High Start Date: 09/03/2021                            Expected End Date: 09/04/2022                      Follow Up as needed   - check blood sugar at prescribed times - check blood sugar if I feel it is too high or too low - enter blood sugar readings and medication or insulin into daily log    Why is this important?   Checking your blood sugar at home helps to keep it from getting very high or very low.  Writing the results in a diary or log helps the doctor know how to care for you.  Your blood sugar log should have the time, date and the results.  Also, write down the amount of insulin or other medicine that you take.  Other information, like what you ate, exercise done and how you were feeling, will also be helpful.     Notes:      Patient Stated     06/04/2022, wants to go back to the facility that has exercises for the lower back        This is a list of the screening recommended for you and due dates:  Health Maintenance  Topic Date Due   DTaP/Tdap/Td vaccine (2 - Td or Tdap) 05/24/2021   Hemoglobin A1C  10/07/2022   Yearly kidney function blood test for diabetes  12/19/2022   Yearly kidney health urinalysis for diabetes  12/19/2022   Complete foot exam   12/19/2022   Eye exam for diabetics  01/29/2023   Medicare Annual Wellness Visit  06/05/2023   Colon Cancer Screening  01/23/2024   Pneumonia Vaccine  Completed   Flu Shot  Completed    Hepatitis C Screening: USPSTF Recommendation to screen - Ages 18-79 yo.  Completed   Zoster (Shingles) Vaccine  Completed   HPV Vaccine  Aged Out   COVID-19 Vaccine  Discontinued    Advanced directives: Please bring a copy of your POA (Power of [place]) and/or Living Will to your next appointment.   Conditions/risks identified: none  Next appointment: Follow up in one year for your annual wellness visit.   Preventive Care 75 Years and Older, Male  Preventive care refers to lifestyle choices and visits with your health care provider that can promote health and wellness. What does preventive care include? A yearly physical exam. This is also called an annual well check. Dental exams once or twice a year. Routine eye exams. Ask your health care provider how often you should have your eyes checked. Personal lifestyle choices, including: Daily care of your teeth and gums. Regular physical activity. Eating a healthy diet. Avoiding tobacco and drug use. Limiting  alcohol use. Practicing safe sex. Taking low doses of aspirin every day. Taking vitamin and mineral supplements as recommended by your health care provider. What happens during an annual well check? The services and screenings done by your health care provider during your annual well check will depend on your age, overall health, lifestyle risk factors, and family history of disease. Counseling  Your health care provider may ask you questions about your: Alcohol use. Tobacco use. Drug use. Emotional well-being. Home and relationship well-being. Sexual activity. Eating habits. History of falls. Memory and ability to understand (cognition). Work and work Statistician. Screening  You may have the following tests or measurements: Height, weight, and BMI. Blood pressure. Lipid and cholesterol levels. These may be checked every 5 years, or more frequently if you are over 75 years old. Skin check. Lung cancer screening. You  may have this screening every year starting at age 75 if you have a 30-pack-year history of smoking and currently smoke or have quit within the past 15 years. Fecal occult blood test (FOBT) of the stool. You may have this test every year starting at age 75. Flexible sigmoidoscopy or colonoscopy. You may have a sigmoidoscopy every 5 years or a colonoscopy every 10 years starting at age 75. Prostate cancer screening. Recommendations will vary depending on your family history and other risks. Hepatitis C blood test. Hepatitis B blood test. Sexually transmitted disease (STD) testing. Diabetes screening. This is done by checking your blood sugar (glucose) after you have not eaten for a while (fasting). You may have this done every 1-3 years. Abdominal aortic aneurysm (AAA) screening. You may need this if you are a current or former smoker. Osteoporosis. You may be screened starting at age 50 if you are at high risk. Talk with your health care provider about your test results, treatment options, and if necessary, the need for more tests. Vaccines  Your health care provider may recommend certain vaccines, such as: Influenza vaccine. This is recommended every year. Tetanus, diphtheria, and acellular pertussis (Tdap, Td) vaccine. You may need a Td booster every 10 years. Zoster vaccine. You may need this after age 75. Pneumococcal 13-valent conjugate (PCV13) vaccine. One dose is recommended after age 75. Pneumococcal polysaccharide (PPSV23) vaccine. One dose is recommended after age 75. Talk to your health care provider about which screenings and vaccines you need and how often you need them. This information is not intended to replace advice given to you by your health care provider. Make sure you discuss any questions you have with your health care provider. Document Released: 06/06/2015 Document Revised: 01/28/2016 Document Reviewed: 03/11/2015 Elsevier Interactive Patient Education  2017 [employer] Prevention in the Home Falls can cause injuries. They can happen to people of all ages. There are many things you can do to make your home safe and to help prevent falls. What can I do on the outside of my home? Regularly fix the edges of walkways and driveways and fix any cracks. Remove anything that might make you trip as you walk through a door, such as a raised step or threshold. Trim any bushes or trees on the path to your home. Use bright outdoor lighting. Clear any walking paths of anything that might make someone trip, such as rocks or tools. Regularly check to see if handrails are loose or broken. Make sure that both sides of any steps have handrails. Any raised decks and porches should have guardrails on the edges. Have any leaves, snow,  or ice cleared regularly. Use sand or salt on walking paths during winter. Clean up any spills in your garage right away. This includes oil or grease spills. What can I do in the bathroom? Use night lights. Install grab bars by the toilet and in the tub and shower. Do not use towel bars as grab bars. Use non-skid mats or decals in the tub or shower. If you need to sit down in the shower, use a plastic, non-slip stool. Keep the floor dry. Clean up any water that spills on the floor as soon as it happens. Remove soap buildup in the tub or shower regularly. Attach bath mats securely with double-sided non-slip rug tape. Do not have throw rugs and other things on the floor that can make you trip. What can I do in the bedroom? Use night lights. Make sure that you have a light by your bed that is easy to reach. Do not use any sheets or blankets that are too big for your bed. They should not hang down onto the floor. Have a firm chair that has side arms. You can use this for support while you get dressed. Do not have throw rugs and other things on the floor that can make you trip. What can I do in the kitchen? Clean up any spills right  away. Avoid walking on wet floors. Keep items that you use a lot in easy-to-reach places. If you need to reach something above you, use a strong step stool that has a grab bar. Keep electrical cords out of the way. Do not use floor polish or wax that makes floors slippery. If you must use wax, use non-skid floor wax. Do not have throw rugs and other things on the floor that can make you trip. What can I do with my stairs? Do not leave any items on the stairs. Make sure that there are handrails on both sides of the stairs and use them. Fix handrails that are broken or loose. Make sure that handrails are as long as the stairways. Check any carpeting to make sure that it is firmly attached to the stairs. Fix any carpet that is loose or worn. Avoid having throw rugs at the top or bottom of the stairs. If you do have throw rugs, attach them to the floor with carpet tape. Make sure that you have a light switch at the top of the stairs and the bottom of the stairs. If you do not have them, ask someone to add them for you. What else can I do to help prevent falls? Wear shoes that: Do not have high heels. Have rubber bottoms. Are comfortable and fit you well. Are closed at the toe. Do not wear sandals. If you use a stepladder: Make sure that it is fully opened. Do not climb a closed stepladder. Make sure that both sides of the stepladder are locked into place. Ask someone to hold it for you, if possible. Clearly mark and make sure that you can see: Any grab bars or handrails. First and last steps. Where the edge of each step is. Use tools that help you move around (mobility aids) if they are needed. These include: Canes. Walkers. Scooters. Crutches. Turn on the lights when you go into a dark area. Replace any light bulbs as soon as they burn out. Set up your furniture so you have a clear path. Avoid moving your furniture around. If any of your floors are uneven, fix them. If there are any  pets around you, be aware of where they are. Review your medicines with your doctor. Some medicines can make you feel dizzy. This can increase your chance of falling. Ask your doctor what other things that you can do to help prevent falls. This information is not intended to replace advice given to you by your health care provider. Make sure you discuss any questions you have with your health care provider. Document Released: 03/06/2009 Document Revised: 10/16/2015 Document Reviewed: 06/14/2014 Elsevier Interactive Patient Education  2017 [employer].

## 2022-06-04 NOTE — Progress Notes (Signed)
Subjective:   Nicholas Rasmussen is a 77 y.o. male who presents for Medicare Annual/Subsequent preventive examination.  Review of Systems     Cardiac Risk Factors include: advanced age (>33mn, >>67women);diabetes mellitus;dyslipidemia;hypertension;obesity (BMI >30kg/m2)     Objective:    Today's Vitals   06/04/22 1338  BP: 122/60  Pulse: (!) 54  Temp: 98 F (36.7 C)  TempSrc: Oral  SpO2: 95%  Weight: 255 lb 12.8 oz (116 kg)  Height: 6' 4.5" (1.943 m)   Body mass index is 30.73 kg/m.     06/04/2022    1:48 PM 11/26/2021    3:20 PM 06/23/2021    2:12 PM 05/27/2021   11:11 AM 05/26/2021    1:38 PM 02/01/2020   10:03 AM 01/02/2020    1:33 PM  Advanced Directives  Does Patient Have a Medical Advance Directive? Yes No Yes No Yes No No  Type of AParamedicof [place] will  Living will  [place]    Copy of [place] Chart? No - copy requested  No - copy requested  No - copy requested    Would patient like information on creating a medical advance directive?  No - Patient declined No - Patient declined   No - Patient declined Yes (MAU/Ambulatory/Procedural Areas - Information given)    Current Medications (verified) Outpatient Encounter Medications as of 06/04/2022  Medication Sig   acetaminophen (TYLENOL) 500 MG tablet Take 1 tablet (500 mg total) by mouth every 8 (eight) hours as needed.   acyclovir (ZOVIRAX) 200 MG capsule Take 1 capsule (200 mg total) by mouth 5 (five) times daily. (Patient taking differently: Take 200 mg by mouth daily as needed.)   acyclovir cream (ZOVIRAX) 5 % APPLY 1 APPLICATION TOPICALLY EVERY 6 (SIX) HOURS.   atorvastatin (LIPITOR) 20 MG tablet TAKE 1 TABLET BY MOUTH DAILY AT 6 PM.   cetirizine (ZYRTEC) 10 MG tablet TAKE 1 TABLET BY MOUTH EVERYDAY AT BEDTIME   clotrimazole (LOTRIMIN) 1 % cream APPLY TO AFFECTED AREA TWICE A DAY   diclofenac Sodium (VOLTAREN) 1 % GEL Apply a  small grape sized dollop to sore joint up to every 6 hours as needed   divalproex (DEPAKOTE ER) 500 MG 24 hr tablet Take 1 tablet (500 mg total) by mouth at bedtime.   gabapentin (NEURONTIN) 300 MG capsule TAKE 1 CAPSULE BY MOUTH EVERYDAY AT BEDTIME   glipiZIDE (GLUCOTROL XL) 5 MG 24 hr tablet TAKE 1 TABLET BY MOUTH EVERY DAY WITH BREAKFAST   ibuprofen (ADVIL) 800 MG tablet Take 1 tablet (800 mg total) by mouth 3 (three) times daily. (Patient taking differently: Take 800 mg by mouth as needed.)   metoprolol succinate (TOPROL-XL) 25 MG 24 hr tablet TAKE 1 TABLET (25 MG TOTAL) BY MOUTH DAILY.   omeprazole (PRILOSEC) 20 MG capsule Take 1 capsule (20 mg total) by mouth in the morning and at bedtime. Take 30-60 minutes before breakfast and dinner   docusate sodium (COLACE) 100 MG capsule TAKE 1 CAPSULE BY MOUTH TWICE A DAY (Patient not taking: Reported on 05/27/2022)   ipratropium (ATROVENT) 0.03 % nasal spray Place 1 spray into both nostrils 2 (two) times daily. (Patient not taking: Reported on 06/04/2022)   No facility-administered encounter medications on file as of 06/04/2022.    Allergies (verified) Patient has no known allergies.   History: Past Medical History:  Diagnosis Date   Allergic rhinitis 05/07/2014   Diabetes mellitus without  complication ([place])    Dizziness 01/02/2016   GERD (gastroesophageal reflux disease)    Headache 03/13/2015   Left flank pain 01/02/2016   Low back pain 06/06/2015   Pain of left side of body 08/13/2015   Post herpetic neuralgia    Routine general medical examination at a health care facility 05/07/2014   Seizure disorder ([place]) 05/07/2014   Seizures ([place])    Last seizure in the 70s.   Shingles    Stye 05/07/2014   Past Surgical History:  Procedure Laterality Date   COLONOSCOPY     KNEE SURGERY Left    left miniscus   UPPER GASTROINTESTINAL ENDOSCOPY     uvala surgery     Family History  Problem Relation Age of Onset   Other Mother         natural causes   Diabetes Father    Hypertension Father    Kidney cancer Brother    Colon cancer Neg Hx    Stomach cancer Neg Hx    Esophageal cancer Neg Hx    Colon polyps Neg Hx    Social History   Socioeconomic History   Marital status: Single    Spouse name: Not on file   Number of children: 3   Years of education: Associates   Highest education level: Not on file  Occupational History   Occupation: Retired    Comment: Oceanographer  Tobacco Use   Smoking status: Former   Smokeless tobacco: Never  Scientific laboratory technician Use: Never used  Substance and Sexual Activity   Alcohol use: Yes    Alcohol/week: 0.0 standard drinks of alcohol    Comment: occasional   Drug use: No   Sexual activity: Not on file  Other Topics Concern   Not on file  Social History Narrative   ** Merged History Encounter **       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 Right-handed. Rarely drinks caffeine.    Social Determinants of Health   Financial Resource Strain: Low Risk  (06/04/2022)   Overall Financial Resource Strain (CARDIA)    Difficulty of Paying Living Expenses: Not hard at all  Food Insecurity: No Food Insecurity (06/04/2022)   Hunger Vital Sign    Worried About Running Out of Food in the Last Year: Never true    Ran Out of Food in the Last Year: Never true  Transportation Needs: No Transportation Needs (06/04/2022)   PRAPARE - Hydrologist (Medical): No    Lack of Transportation (Non-Medical): No  Physical Activity: Sufficiently Active (06/04/2022)   Exercise Vital Sign    Days of Exercise per Week: 3 days    Minutes of Exercise per Session: 150+ min  Stress: No Stress Concern Present (06/04/2022)   [place]    Feeling of Stress : Not at all  Social Connections: Moderately Integrated  (05/26/2021)   Social Connection and Isolation Panel [NHANES]    Frequency of Communication with Friends and Family: Twice a week    Frequency of Social Gatherings with Friends and Family: Twice a week    Attends Religious Services: More than 4 times per year    Active Member of [employer] or Organizations: No    Attends Archivist Meetings: Never    Marital Status: Married    Tobacco Counseling Counseling given: Not  Answered   Clinical Intake:  Pre-visit preparation completed: Yes  Pain : No/denies pain     Nutritional Status: BMI > 30  Obese Nutritional Risks: None Diabetes: Yes  How often do you need to have someone help you when you read instructions, pamphlets, or other written materials from your doctor or pharmacy?: 1 - Never  Diabetic? Yes Nutrition Risk Assessment:  Has the patient had any N/V/D within the last 2 months?  No  Does the patient have any non-healing wounds?  No  Has the patient had any unintentional weight loss or weight gain?  No   Diabetes:  Is the patient diabetic?  Yes  If diabetic, was a CBG obtained today?  No  Did the patient bring in their glucometer from home?  No  How often do you monitor your CBG's? Does not.   Financial Strains and Diabetes Management:  Are you having any financial strains with the device, your supplies or your medication? No .  Does the patient want to be seen by Chronic Care Management for management of their diabetes?  No  Would the patient like to be referred to a Nutritionist or for Diabetic Management?  No   Diabetic Exams:  Diabetic Eye Exam: Completed 01/28/2022 Diabetic Foot Exam: Completed 12/18/2021   Interpreter Needed?: No  Information entered by :: NAllen LPN   Activities of Daily Living    06/04/2022    1:49 PM 08/19/2021   11:21 AM  In your present state of health, do you have any difficulty performing the following activities:  Hearing? 0 0  Vision? 0 0  Difficulty concentrating or  making decisions? 0 0  Walking or climbing stairs? 0 0  Dressing or bathing? 0 0  Doing errands, shopping? 0 0  Preparing Food and eating ? N   Using the Toilet? N   In the past six months, have you accidently leaked urine? N   Do you have problems with loss of bowel control? N   Managing your Medications? N   Managing your Finances? N   Housekeeping or managing your Housekeeping? N     Patient Care Team: Nche, Charlene Brooke, NP as PCP - General (Internal Medicine) Belva Crome, MD as PCP - Cardiology (Cardiology) Germaine Pomfret, [facility] as Pharmacist (Pharmacist)  Indicate any recent Medical Services you may have received from other than [facility] providers in the past year (date may be approximate).     Assessment:   This is a routine wellness examination for Nicholas Rasmussen.  Hearing/Vision screen Vision Screening - Comments:: Regular eye exams, [facility]  Dietary issues and exercise activities discussed: Current Exercise Habits: Home exercise routine, Type of exercise: Other - see comments (dancing), Time (Minutes): > 60, Frequency (Times/Week): 3, Weekly Exercise (Minutes/Week): 0   Goals Addressed             This Visit's Progress    Patient Stated       06/04/2022, wants to go back to the facility that has exercises for the lower back       Depression Screen    06/04/2022    1:49 PM 05/27/2022    3:53 PM 12/08/2021   11:18 AM 11/26/2021    3:19 PM 06/30/2021    1:43 PM 05/26/2021    1:41 PM 05/26/2021    1:36 PM  PHQ 2/9 Scores  PHQ - 2 Score 0 0 0 0 0 0 0    Fall Risk    06/04/2022  1:49 PM 05/27/2022    3:53 PM 12/08/2021   11:18 AM 11/26/2021    3:19 PM 06/30/2021    1:43 PM  Fall Risk   Falls in the past year? 0 0 0 0 0  Number falls in past yr: 0 0 0  0  Injury with Fall? 0 0     Risk for fall due to : Medication side effect No Fall Risks     Follow up Falls prevention discussed;Education provided;Falls evaluation completed Falls evaluation completed        FALL RISK PREVENTION PERTAINING TO THE HOME:  Any stairs in or around the home? Yes  If so, are there any without handrails? No  Home free of loose throw rugs in walkways, pet beds, electrical cords, etc? Yes  Adequate lighting in your home to reduce risk of falls? Yes   ASSISTIVE DEVICES UTILIZED TO PREVENT FALLS:  Life alert? No  Use of a cane, walker or w/c? No  Grab bars in the bathroom? Yes  Shower chair or bench in shower? No  Elevated toilet seat or a handicapped toilet? No   TIMED UP AND GO:  Was the test performed? Yes .  Length of time to ambulate 10 feet: 5 sec.   Gait steady and fast without use of assistive device  Cognitive Function:        06/04/2022    1:50 PM  6CIT Screen  What Year? 0 points  What month? 0 points  What time? 0 points  Count back from 20 0 points  Months in reverse 0 points  Repeat phrase 0 points  Total Score 0 points    Immunizations Immunization History  Administered Date(s) Administered   Fluad Quad(high Dose 65+) 02/01/2019, 02/26/2020, 03/02/2021, 02/19/2022   Influenza Whole 02/21/2014   Influenza, High Dose Seasonal PF 02/03/2016, 02/03/2017, 02/28/2018   Influenza,inj,Quad PF,6+ Mos 03/13/2015   PFIZER(Purple Top)SARS-COV-2 Vaccination 07/22/2019, 08/21/2019, 03/03/2020   Pneumococcal Conjugate-13 05/06/2015   Pneumococcal Polysaccharide-23 05/07/2014   Tdap 05/25/2011   Zoster Recombinat (Shingrix) 12/03/2016, 02/16/2017    TDAP status: Due, Education has been provided regarding the importance of this vaccine. Advised may receive this vaccine at local pharmacy or Health Dept. Aware to provide a copy of the vaccination record if obtained from local pharmacy or Health Dept. Verbalized acceptance and understanding.  Flu Vaccine status: Up to date  Pneumococcal vaccine status: Up to date  Covid-19 vaccine status: Completed vaccines  Qualifies for Shingles Vaccine? Yes   Zostavax completed Yes   Shingrix  Completed?: Yes  Screening Tests Health Maintenance  Topic Date Due   DTaP/Tdap/Td (2 - Td or Tdap) 05/24/2021   Medicare Annual Wellness (AWV)  05/26/2022   HEMOGLOBIN A1C  10/07/2022   Diabetic kidney evaluation - eGFR measurement  12/19/2022   Diabetic kidney evaluation - Urine ACR  12/19/2022   FOOT EXAM  12/19/2022   OPHTHALMOLOGY EXAM  01/29/2023   COLONOSCOPY (Pts 45-58yr Insurance coverage will need to be confirmed)  01/23/2024   Pneumonia Vaccine 75 Years old  Completed   INFLUENZA VACCINE  Completed   Hepatitis C Screening  Completed   Zoster Vaccines- Shingrix  Completed   HPV VACCINES  Aged Out   COVID-19 Vaccine  Discontinued    Health Maintenance  Health Maintenance Due  Topic Date Due   DTaP/Tdap/Td (2 - Td or Tdap) 05/24/2021   Medicare Annual Wellness (AWV)  05/26/2022    Colorectal cancer screening: Type of screening: Colonoscopy. Completed 01/22/2014.  Repeat every 10 years  Lung Cancer Screening: (Low Dose CT Chest recommended if Age 82-80 years, 30 pack-year currently smoking OR have quit w/in 15years.) does not qualify.   Lung Cancer Screening Referral: no  Additional Screening:  Hepatitis C Screening: does qualify; Completed 10/28/2015  Vision Screening: Recommended annual ophthalmology exams for early detection of glaucoma and other disorders of the eye. Is the patient up to date with their annual eye exam?  Yes  Who is the provider or what is the name of the office in which the patient attends annual eye exams? [facility] If pt is not established with a provider, would they like to be referred to a provider to establish care? No .   Dental Screening: Recommended annual dental exams for proper oral hygiene  Community Resource Referral / Chronic Care Management: CRR required this visit?  No   CCM required this visit?  No      Plan:     I have personally reviewed and noted the following in the patient's chart:   Medical and social  history Use of alcohol, tobacco or illicit drugs  Current medications and supplements including opioid prescriptions. Patient is not currently taking opioid prescriptions. Functional ability and status Nutritional status Physical activity Advanced directives List of other physicians Hospitalizations, surgeries, and ER visits in previous 12 months Vitals Screenings to include cognitive, depression, and falls Referrals and appointments  In addition, I have reviewed and discussed with patient certain preventive protocols, quality metrics, and best practice recommendations. A written personalized care plan for preventive services as well as general preventive health recommendations were provided to patient.     Kellie Simmering, LPN   [id number]   Nurse Notes: none

## 2022-06-22 DIAGNOSIS — H00022 Hordeolum internum right lower eyelid: Secondary | ICD-10-CM | POA: Diagnosis not present

## 2022-06-25 ENCOUNTER — Telehealth: Payer: Self-pay | Admitting: Nurse Practitioner

## 2022-06-25 ENCOUNTER — Ambulatory Visit (INDEPENDENT_AMBULATORY_CARE_PROVIDER_SITE_OTHER): Payer: PPO | Admitting: Nurse Practitioner

## 2022-06-25 ENCOUNTER — Encounter: Payer: Self-pay | Admitting: Nurse Practitioner

## 2022-06-25 VITALS — BP 138/76 | HR 48 | Temp 98.0°F | Resp 14 | Ht 76.0 in | Wt 255.0 lb

## 2022-06-25 DIAGNOSIS — B0229 Other postherpetic nervous system involvement: Secondary | ICD-10-CM

## 2022-06-25 DIAGNOSIS — I77810 Thoracic aortic ectasia: Secondary | ICD-10-CM

## 2022-06-25 DIAGNOSIS — G40909 Epilepsy, unspecified, not intractable, without status epilepticus: Secondary | ICD-10-CM

## 2022-06-25 DIAGNOSIS — E785 Hyperlipidemia, unspecified: Secondary | ICD-10-CM | POA: Diagnosis not present

## 2022-06-25 DIAGNOSIS — E1142 Type 2 diabetes mellitus with diabetic polyneuropathy: Secondary | ICD-10-CM

## 2022-06-25 DIAGNOSIS — I25119 Atherosclerotic heart disease of native coronary artery with unspecified angina pectoris: Secondary | ICD-10-CM

## 2022-06-25 DIAGNOSIS — M25562 Pain in left knee: Secondary | ICD-10-CM | POA: Diagnosis not present

## 2022-06-25 DIAGNOSIS — G8929 Other chronic pain: Secondary | ICD-10-CM

## 2022-06-25 DIAGNOSIS — M545 Low back pain, unspecified: Secondary | ICD-10-CM | POA: Diagnosis not present

## 2022-06-25 DIAGNOSIS — N2889 Other specified disorders of kidney and ureter: Secondary | ICD-10-CM

## 2022-06-25 DIAGNOSIS — E1169 Type 2 diabetes mellitus with other specified complication: Secondary | ICD-10-CM | POA: Diagnosis not present

## 2022-06-25 DIAGNOSIS — Z23 Encounter for immunization: Secondary | ICD-10-CM | POA: Diagnosis not present

## 2022-06-25 DIAGNOSIS — N281 Cyst of kidney, acquired: Secondary | ICD-10-CM

## 2022-06-25 LAB — RENAL FUNCTION PANEL
Albumin: 4.1 g/dL (ref 3.5–5.2)
BUN: 20 mg/dL (ref 6–23)
CO2: 31 mEq/L (ref 19–32)
Calcium: 9.2 mg/dL (ref 8.4–10.5)
Chloride: 102 mEq/L (ref 96–112)
Creatinine, Ser: 1.22 mg/dL (ref 0.40–1.50)
GFR: 58.5 mL/min — ABNORMAL LOW (ref >60.00)
Glucose, Bld: 91 mg/dL (ref 70–99)
Phosphorus: 3.8 mg/dL (ref 2.3–4.6)
Potassium: 4.3 mEq/L (ref 3.5–5.1)
Sodium: 140 mEq/L (ref 135–145)

## 2022-06-25 LAB — LDL CHOLESTEROL, DIRECT: Direct LDL: 70 mg/dL

## 2022-06-25 LAB — HEMOGLOBIN A1C: Hgb A1c MFr Bld: 6.3 % (ref 4.6–6.5)

## 2022-06-25 MED ORDER — GLIPIZIDE ER 5 MG PO TB24: ORAL_TABLET | ORAL | 3 refills | Status: DC

## 2022-06-25 MED ORDER — GABAPENTIN 300 MG PO CAPS: ORAL_CAPSULE | ORAL | 3 refills | Status: DC

## 2022-06-25 NOTE — Telephone Encounter (Signed)
I consulted with Dr. Jacalyn Lefevre about Mri ABD report completed in 04/2022. He recommended repeat MRI in 38month instead of 665monthfrom 04/2022 to ensure renal cyst has not changed. I entered new order

## 2022-06-25 NOTE — Assessment & Plan Note (Signed)
Repeat echo 10/2021: Stable aortic dilatation at 65m. CT chest completed 10/2021: no aortic aneurysm No additional imaging needed Denies any chest pain or SOB or leg pain or cyanosis BP at goal, DM controlled

## 2022-06-25 NOTE — Patient Instructions (Addendum)
Go to lab You will be contacted to schedule appt for repeat MRI in June

## 2022-06-25 NOTE — Assessment & Plan Note (Signed)
Repeat LDL

## 2022-06-25 NOTE — Assessment & Plan Note (Signed)
Controlled with glipizide Last hgbA1c at 5.8% Uptodate with eye exam

## 2022-06-25 NOTE — Telephone Encounter (Signed)
Pt advised.

## 2022-06-25 NOTE — Assessment & Plan Note (Signed)
Secondary to meniscus tear and advanced OA in joint. He is not interested in any surgical intervention. Reports pain in stable with use of knee brace. He request for outpatient referral to help strengthen surrounding muscles

## 2022-06-25 NOTE — Progress Notes (Signed)
Established Patient Visit  Patient: Nicholas Rasmussen   DOB: [birth year]   77 y.o. Male  MRN: [id number] Visit Date: 06/25/2022  Subjective:    Chief Complaint  Patient presents with   Office visit    Pt is here for 3 mos f/u- he is not fasting. Had 2-3 eggs and oj   HPI Aortic root dilatation (HCC) Repeat echo 10/2021: Stable aortic dilatation at 38m. CT chest completed 10/2021: no aortic aneurysm No additional imaging needed Denies any chest pain or SOB or leg pain or cyanosis BP at goal, DM controlled  Coronary artery disease involving native coronary artery of native heart with angina pectoris (HCC) BP at goal, HgbA1c at 5.8%, current use of atorvastatin. No CP or SOB Repeat LDL today  Hyperlipidemia associated with type 2 diabetes mellitus ([place] Repeat LDL  Type 2 diabetes mellitus with diabetic polyneuropathy, without long-term current use of insulin ([place] Controlled with glipizide Last hgbA1c at 5.8% Uptodate with eye exam  Seizure disorder Under the care of neurology Denies any seizure activity in last 1year. Current use of depakote  Chronic midline low back pain without sciatica Request for another referral to outpatient PT due to recurrent back pain after competitive dancing. No saddle paresthesia, no focal muscle weakness  Chronic pain of left knee Secondary to meniscus tear and advanced OA in joint. He is not interested in any surgical intervention. Reports pain in stable with use of knee brace. He request for outpatient referral to help strengthen surrounding muscles  Reviewed medical, surgical, and social history today  Medications: Outpatient Medications Prior to Visit  Medication Sig   acetaminophen (TYLENOL) 500 MG tablet Take 1 tablet (500 mg total) by mouth every 8 (eight) hours as needed.   acyclovir (ZOVIRAX) 200 MG capsule Take 1 capsule (200 mg total) by mouth 5 (five) times daily. (Patient taking differently: Take 200 mg by mouth  daily as needed.)   acyclovir cream (ZOVIRAX) 5 % APPLY 1 APPLICATION TOPICALLY EVERY 6 (SIX) HOURS.   atorvastatin (LIPITOR) 20 MG tablet TAKE 1 TABLET BY MOUTH DAILY AT 6 PM.   cetirizine (ZYRTEC) 10 MG tablet TAKE 1 TABLET BY MOUTH EVERYDAY AT BEDTIME   clotrimazole (LOTRIMIN) 1 % cream APPLY TO AFFECTED AREA TWICE A DAY   diclofenac Sodium (VOLTAREN) 1 % GEL Apply a small grape sized dollop to sore joint up to every 6 hours as needed   divalproex (DEPAKOTE ER) 500 MG 24 hr tablet Take 1 tablet (500 mg total) by mouth at bedtime.   docusate sodium (COLACE) 100 MG capsule TAKE 1 CAPSULE BY MOUTH TWICE A DAY   gabapentin (NEURONTIN) 300 MG capsule TAKE 1 CAPSULE BY MOUTH EVERYDAY AT BEDTIME   glipiZIDE (GLUCOTROL XL) 5 MG 24 hr tablet TAKE 1 TABLET BY MOUTH EVERY DAY WITH BREAKFAST   ibuprofen (ADVIL) 800 MG tablet Take 1 tablet (800 mg total) by mouth 3 (three) times daily. (Patient taking differently: Take 800 mg by mouth as needed.)   metoprolol succinate (TOPROL-XL) 25 MG 24 hr tablet TAKE 1 TABLET (25 MG TOTAL) BY MOUTH DAILY.   omeprazole (PRILOSEC) 20 MG capsule Take 1 capsule (20 mg total) by mouth in the morning and at bedtime. Take 30-60 minutes before breakfast and dinner   [DISCONTINUED] ipratropium (ATROVENT) 0.03 % nasal spray Place 1 spray into both nostrils 2 (two) times daily. (Patient not taking: Reported on 06/04/2022)   No  facility-administered medications prior to visit.   Reviewed past medical and social history.   ROS per HPI above      Objective:  BP 138/76 (BP Location: Left Arm, Patient Position: Sitting, Cuff Size: Large)   Pulse (!) 48   Temp 98 F (36.7 C) (Oral)   Resp 14   Ht '6\' 4"'$  (1.93 m)   Wt 255 lb (115.7 kg)   SpO2 95%   BMI 31.04 kg/m      Physical Exam Cardiovascular:     Rate and Rhythm: Normal rate and regular rhythm.     Pulses: Normal pulses.     Heart sounds: Normal heart sounds.  Pulmonary:     Effort: Pulmonary effort is normal.      Breath sounds: Normal breath sounds.  Musculoskeletal:     Right lower leg: No edema.     Left lower leg: No edema.  Neurological:     Mental Status: He is alert and oriented to person, place, and time.  Psychiatric:        Mood and Affect: Mood normal.        Behavior: Behavior normal.        Thought Content: Thought content normal.     No results found for any visits on 06/25/22.    Assessment & Plan:    Problem List Items Addressed This Visit       Cardiovascular and Mediastinum   Aortic root dilatation ([place])    Repeat echo 10/2021: Stable aortic dilatation at 27m. CT chest completed 10/2021: no aortic aneurysm No additional imaging needed Denies any chest pain or SOB or leg pain or cyanosis BP at goal, DM controlled      Coronary artery disease involving native coronary artery of native heart with angina pectoris (HCC)    BP at goal, HgbA1c at 5.8%, current use of atorvastatin. No CP or SOB Repeat LDL today        Endocrine   Hyperlipidemia associated with type 2 diabetes mellitus ([place]    Repeat LDL      Relevant Orders   Direct LDL   Type 2 diabetes mellitus with diabetic polyneuropathy, without long-term current use of insulin ([place]    Controlled with glipizide Last hgbA1c at 5.8% Uptodate with eye exam      Relevant Orders   Hemoglobin A1c   Renal Function Panel     Nervous and Auditory   Seizure disorder ([place]    Under the care of neurology Denies any seizure activity in last 1year. Current use of depakote        Other   Chronic midline low back pain without sciatica - Primary    Request for another referral to outpatient PT due to recurrent back pain after competitive dancing. No saddle paresthesia, no focal muscle weakness      Chronic pain of left knee    Secondary to meniscus tear and advanced OA in joint. He is not interested in any surgical intervention. Reports pain in stable with use of knee brace. He request for outpatient  referral to help strengthen surrounding muscles      Other Visit Diagnoses     Need for tetanus booster       Relevant Orders   Td : Tetanus/diphtheria >7yo Preservative  free (Completed)      Return in about 6 months (around 12/24/2022) for HTN, DM, hyperlipidemia (fasting).     CWilfred Lacy NP

## 2022-06-25 NOTE — Assessment & Plan Note (Signed)
BP at goal, HgbA1c at 5.8%, current use of atorvastatin. No CP or SOB Repeat LDL today

## 2022-06-25 NOTE — Assessment & Plan Note (Addendum)
Request for another referral to outpatient PT due to recurrent back pain after competitive dancing. No saddle paresthesia, no focal muscle weakness

## 2022-06-25 NOTE — Assessment & Plan Note (Signed)
Under the care of neurology Denies any seizure activity in last 1year. Current use of depakote

## 2022-07-02 ENCOUNTER — Telehealth: Payer: Self-pay | Admitting: Nurse Practitioner

## 2022-07-02 DIAGNOSIS — G8929 Other chronic pain: Secondary | ICD-10-CM

## 2022-07-02 NOTE — Telephone Encounter (Signed)
Called patient and made him aware of referral

## 2022-07-02 NOTE — Telephone Encounter (Signed)
Patient called in to check and see if an order had been put in for rehab for him. I didn't see one. He would like a call back at [phone]

## 2022-07-13 DIAGNOSIS — E119 Type 2 diabetes mellitus without complications: Secondary | ICD-10-CM | POA: Diagnosis not present

## 2022-07-13 NOTE — Therapy (Incomplete)
OUTPATIENT PHYSICAL THERAPY THORACOLUMBAR/KNEE EVALUATION   Patient Name: Nicholas Rasmussen MRN: [id number] DOB:1945/11/15, 77 y.o., male Today's Date: 07/14/2022  END OF SESSION:  PT End of Session - 07/14/22 0955     Visit Number 1    Number of Visits 12    Date for PT Re-Evaluation 08/25/22    Authorization Type [employer]    PT Start Time 0851    PT Stop Time 0940    PT Time Calculation (min) 49 min    Activity Tolerance Patient tolerated treatment well    Behavior During Therapy [facility] for tasks assessed/performed             Past Medical History:  Diagnosis Date   Allergic rhinitis 05/07/2014   Diabetes mellitus without complication ([place])    Dizziness 01/02/2016   GERD (gastroesophageal reflux disease)    Headache 03/13/2015   Left flank pain 01/02/2016   Low back pain 06/06/2015   Pain of left side of body 08/13/2015   Post herpetic neuralgia    Routine general medical examination at a health care facility 05/07/2014   Seizure disorder ([place]) 05/07/2014   Seizures ([place])    Last seizure in the 70s.   Shingles    Stye 05/07/2014   Past Surgical History:  Procedure Laterality Date   COLONOSCOPY     KNEE SURGERY Left    left miniscus   UPPER GASTROINTESTINAL ENDOSCOPY     uvala surgery     Patient Active Problem List   Diagnosis Date Noted   Acute medial meniscus tear of left knee 02/04/2022   Primary osteoarthritis of left knee 02/04/2022   Aortic root dilatation (HCC) 12/18/2021   Slow transit constipation 12/08/2021   Chest wall muscle strain 12/08/2021   Multiple pulmonary nodules 10/06/2021   Hyperlipidemia associated with type 2 diabetes mellitus ([place]) 08/19/2021   [name] 04/07/2021   Complex renal cyst 04/07/2021   Varicose veins of both lower extremities with pain 02/02/2019   Encounter for therapeutic drug level monitoring 09/19/2018   Type 2 diabetes mellitus with diabetic polyneuropathy,  without long-term current use of insulin ([place]) 03/29/2018   Diabetic neuropathy ([place]) 03/29/2018   Chronic pain of left knee 03/23/2018   Coronary artery disease involving native coronary artery of native heart with angina pectoris ([place]) 08/03/2017   Post herpetic neuralgia 07/29/2016   Dizziness 01/02/2016   Chronic midline low back pain without sciatica 06/06/2015   Nonintractable episodic headache 03/13/2015   GERD (gastroesophageal reflux disease) 11/15/2014   Seizure disorder ([place]) 05/07/2014   Allergic rhinitis 05/07/2014    PCP: Flossie Buffy, NP  REFERRING PROVIDER: Flossie Buffy, NP  REFERRING DIAG: M54.50,G89.29 (ICD-10-CM) - Chronic midline low back pain without sciatica M25.562,G89.29 (ICD-10-CM) - Chronic pain of left knee  Rationale for Evaluation and Treatment: Rehabilitation  THERAPY DIAG:  Chronic pain of left knee  Chronic midline low back pain without sciatica  Pain in right leg  Muscle weakness (generalized)  ONSET DATE: a few weeks ago  SUBJECTIVE:  SUBJECTIVE STATEMENT:  Patient states he was at a concert a few weeks ago and noticed when he bent his knee far back it caused a sharp pain. At rest he does not report any pain in his knee though. He also was referred for low back which he said is not as bad as the knee pain. He reports his back hurts more on the right side and goes down into the right hip. He said it can feel tingly sometimes in the hip, but does not go past his knee. He described the pain as "a little tingly and itches" and hasn't noticed anything specific that brings this on, but reports that it goes away on its own.    PERTINENT HISTORY:  PMH: diabetes, HTN, CAD, seizures, OA and medial meniscus tear of left knee  PAIN:  Are you having pain?  Yes: NPRS scale: knee currently 0/10, but can get to 9-10/10 when bent back far, low back currently 5/10 but can get up to 7/10 Pain location: left knee inferior to patella more medial, low back (more on right side), right hip region  Pain description: sharp pain in the knee, achy in the low back, and sometimes tingly in the hip region Aggravating factors: bending knee, extending back  Relieving factors: resting   PRECAUTIONS: None  WEIGHT BEARING RESTRICTIONS: No  FALLS:  Has patient fallen in last 6 months? No  LIVING ENVIRONMENT: Lives in: House/apartment Stairs: 1 flight on stairs, said he goes up and down sideways  OCCUPATION: retired -- enjoys dancing, has an event coming up 3/7   PLOF: Independent  PATIENT GOALS: get back to dancing   NEXT MD VISIT:   OBJECTIVE:   DIAGNOSTIC FINDINGS:  IMPRESSION: (for left knee) 1. Oblique tear of the posterior horn of the medial meniscus extending to the inferior articular surface. 2. High-grade partial-thickness cartilage loss of the medial femorotibial compartment with areas of full-thickness cartilage loss of the weight-bearing surface of the medial femoral condyle.  PATIENT SURVEYS:  Eval: FOTO 46% goal 55%  SCREENING FOR RED FLAGS: Bowel or bladder incontinence: No Cauda equina syndrome: No   COGNITION: Overall cognitive status: Within functional limits for tasks assessed     SENSATION: Light touch: WFL   PALPATION: TTP right gluteal region and greater trochanter, left knee joint line and inferior patella No pain or hypomobility noted in lumbar spine with PAMs   LUMBAR ROM:   AROM eval  Flexion WFL   Extension 75%  Right lateral flexion WFL  Left lateral flexion WFL  Right rotation 50%  Left rotation 50%   (Blank rows = not tested)  LOWER EXTREMITY ROM:     Active  Right eval Left eval  Hip flexion    Hip extension    Hip abduction    Hip adduction    Hip internal rotation    Hip external  rotation    Knee flexion 121 106  Knee extension 0 2  Ankle dorsiflexion    Ankle plantarflexion    Ankle inversion    Ankle eversion     (Blank rows = not tested)  LOWER EXTREMITY MMT:    MMT tested in sitting Right eval Left eval  Hip flexion 5 4+  Hip extension 4 4  Hip abduction 5 5  Hip adduction    Hip internal rotation    Hip external rotation    Knee flexion 5 4+  Knee extension 5 4  Ankle dorsiflexion    Ankle plantarflexion  Ankle inversion    Ankle eversion     (Blank rows = not tested)  LUMBAR SPECIAL TESTS:  Eval: Slump test: Negative  FUNCTIONAL TESTS:  Eval: 5 times sit to stand: 16.41sec without UE support, decreased weight shift left and some associated knee pain with this    SLS around 20 sec each leg   TODAY'S TREATMENT:  Eval HEP creation and review with demonstration and trial set preformed, see below for details  -Vasopnuematic device X 10 min, medium compression, 34 deg to left knee  PATIENT EDUCATION: Education details: HEP, PT plan of care Person educated: Patient Education method: Explanation, Demonstration, Verbal cues, and Handouts Education comprehension: verbalized understanding and needs further education   HOME EXERCISE PROGRAM: Access Code: [email or web address] URL: [email or web address] Date: 07/14/2022 Prepared by: Elsie Ra  Exercises - Supine Bridge  - 2 x daily - 6 x weekly - 1-2 sets - 10 reps - 5 hold - Supine Straight Leg Raise  - 2 x daily - 6 x weekly - 3 sets - 10 reps - Sit to Stand Without Arm Support  - 2 x daily - 6 x weekly - 1-2 sets - 10 reps - Supine Hamstring Stretch with Strap  - 2 x daily - 6 x weekly - 1 sets - 3 reps - 30 hold - Seated Knee Flexion AAROM  - 2 x daily - 6 x weekly - 1 sets - 10 reps - 5 hold - Hip Extension with Resistance Loop  - 2 x daily - 6 x weekly - 1-2 sets - 10 reps - Standing Hip Abduction with Resistance at Ankles and Counter Support  - 2 x daily - 6 x weekly - 1-2  sets - 10 reps  ASSESSMENT:  CLINICAL IMPRESSION: Patient referred to PT for chronic low back and chronic left knee pain. His MRI shows tear in medial mensicus of left knee and he presents with symptoms in left knee that align with that dx. He was advised to avoid deep squatting/excessive bending of the left knee as well as turning/rotating on a fixed leg. In regards to his low back pain, radiculopathy is not suspected at this time, but some exercises with extension were included in his program to see if this does improve the tingling in his hip region. Overall he shows limitations in general LE strength, and some lumbar and knee mobility that he will benefit from skilled PT to address and improve overall function.  OBJECTIVE IMPAIRMENTS: decreased activity tolerance, difficulty walking, decreased balance, decreased endurance, decreased mobility, decreased ROM, decreased strength, impaired flexibility, impaired LE use, postural dysfunction, and pain.  ACTIVITY LIMITATIONS: bending, lifting, carry, locomotion, cleaning, community activity, driving, and dancing   PERSONAL FACTORS: PMH: diabetes, HTN, CAD, seizures, OA and medial meniscus tear of left knee are also affecting patient's functional outcome.  REHAB POTENTIAL: Good  CLINICAL DECISION MAKING: Evolving/moderate complexity  EVALUATION COMPLEXITY: Moderate    GOALS: Short term PT Goals Target date: 08/11/2022   Pt will be I and compliant with HEP. Baseline:  Goal status: New Pt will decrease pain by 25% overall Baseline: Goal status: New  Long term PT goals Target date:09/08/2022   Pt will improve lumbar ROM to [facility] to improve functional mobility Baseline: Goal status: New Pt will improve  hip/knee strength to at least 5-/5 MMT to improve functional strength Baseline: Goal status: New Pt will improve FOTO to at least 55% functional to show improved function Baseline: Goal status: New Pt will reduce pain to  overall less  than 2-3/10 with usual activity and dancing Baseline: Goal status: New Pt will reduce 5 times STS in under 13 sec to show improved function Baseline: Goal status: New   PLAN: PT FREQUENCY: 1-2 times per week   PT DURATION: 4-6 weeks  PLANNED INTERVENTIONS (unless contraindicated): aquatic PT, Canalith repositioning, cryotherapy, Electrical stimulation, Iontophoresis with 4 mg/ml dexamethasome, Moist heat, traction, Ultrasound, gait training, Therapeutic exercise, balance training, neuromuscular re-education, patient/family education, prosthetic training, manual techniques, passive ROM, dry needling, taping, vasopnuematic device, vestibular, spinal manipulations, joint manipulations  PLAN FOR NEXT SESSION: review HEP, how has the tingling been? General LE strengthening, stairs    Mahlet Jergens, Student-PT 07/14/2022, 9:57 AM

## 2022-07-14 ENCOUNTER — Ambulatory Visit: Payer: PPO | Admitting: Physical Therapy

## 2022-07-14 ENCOUNTER — Other Ambulatory Visit: Payer: Self-pay

## 2022-07-14 ENCOUNTER — Encounter: Payer: Self-pay | Admitting: Physical Therapy

## 2022-07-14 DIAGNOSIS — M25562 Pain in left knee: Secondary | ICD-10-CM | POA: Diagnosis not present

## 2022-07-14 DIAGNOSIS — M6281 Muscle weakness (generalized): Secondary | ICD-10-CM | POA: Diagnosis not present

## 2022-07-14 DIAGNOSIS — G8929 Other chronic pain: Secondary | ICD-10-CM

## 2022-07-14 DIAGNOSIS — M79604 Pain in right leg: Secondary | ICD-10-CM

## 2022-07-14 DIAGNOSIS — M545 Low back pain, unspecified: Secondary | ICD-10-CM

## 2022-07-14 NOTE — Addendum Note (Signed)
Addended by: Debbe Odea on: 07/14/2022 12:59 PM   Modules accepted: Orders

## 2022-07-19 ENCOUNTER — Encounter: Payer: Self-pay | Admitting: Physical Therapy

## 2022-07-19 ENCOUNTER — Ambulatory Visit: Payer: PPO | Admitting: Physical Therapy

## 2022-07-19 DIAGNOSIS — M545 Low back pain, unspecified: Secondary | ICD-10-CM

## 2022-07-19 DIAGNOSIS — M6281 Muscle weakness (generalized): Secondary | ICD-10-CM | POA: Diagnosis not present

## 2022-07-19 DIAGNOSIS — M25562 Pain in left knee: Secondary | ICD-10-CM

## 2022-07-19 DIAGNOSIS — G8929 Other chronic pain: Secondary | ICD-10-CM | POA: Diagnosis not present

## 2022-07-19 DIAGNOSIS — M79604 Pain in right leg: Secondary | ICD-10-CM | POA: Diagnosis not present

## 2022-07-19 NOTE — Therapy (Signed)
OUTPATIENT PHYSICAL THERAPY THORACOLUMBAR/KNEE TREATMENT    Patient Name: Nicholas Rasmussen MRN: [id number] DOB:05-24-1946, 77 y.o., male Today's Date: 07/19/2022  END OF SESSION:  PT End of Session - 07/19/22 1342     Visit Number 2    Number of Visits 12    Date for PT Re-Evaluation 08/25/22    Authorization Type [employer]    PT Start Time 1345    PT Stop Time 1428    PT Time Calculation (min) 43 min    Activity Tolerance Patient tolerated treatment well    Behavior During Therapy [facility] for tasks assessed/performed              Past Medical History:  Diagnosis Date   Allergic rhinitis 05/07/2014   Diabetes mellitus without complication ([place])    Dizziness 01/02/2016   GERD (gastroesophageal reflux disease)    Headache 03/13/2015   Left flank pain 01/02/2016   Low back pain 06/06/2015   Pain of left side of body 08/13/2015   Post herpetic neuralgia    Routine general medical examination at a health care facility 05/07/2014   Seizure disorder ([place]) 05/07/2014   Seizures ([place])    Last seizure in the 70s.   Shingles    Stye 05/07/2014   Past Surgical History:  Procedure Laterality Date   COLONOSCOPY     KNEE SURGERY Left    left miniscus   UPPER GASTROINTESTINAL ENDOSCOPY     uvala surgery     Patient Active Problem List   Diagnosis Date Noted   Acute medial meniscus tear of left knee 02/04/2022   Primary osteoarthritis of left knee 02/04/2022   Aortic root dilatation (HCC) 12/18/2021   Slow transit constipation 12/08/2021   Chest wall muscle strain 12/08/2021   Multiple pulmonary nodules 10/06/2021   Hyperlipidemia associated with type 2 diabetes mellitus ([place]) 08/19/2021   [name] 04/07/2021   Complex renal cyst 04/07/2021   Varicose veins of both lower extremities with pain 02/02/2019   Encounter for therapeutic drug level monitoring 09/19/2018   Type 2 diabetes mellitus with diabetic polyneuropathy,  without long-term current use of insulin ([place]) 03/29/2018   Diabetic neuropathy ([place]) 03/29/2018   Chronic pain of left knee 03/23/2018   Coronary artery disease involving native coronary artery of native heart with angina pectoris ([place]) 08/03/2017   Post herpetic neuralgia 07/29/2016   Dizziness 01/02/2016   Chronic midline low back pain without sciatica 06/06/2015   Nonintractable episodic headache 03/13/2015   GERD (gastroesophageal reflux disease) 11/15/2014   Seizure disorder ([place]) 05/07/2014   Allergic rhinitis 05/07/2014    PCP: Flossie Buffy, NP  REFERRING PROVIDER: Flossie Buffy, NP  REFERRING DIAG: M54.50,G89.29 (ICD-10-CM) - Chronic midline low back pain without sciatica M25.562,G89.29 (ICD-10-CM) - Chronic pain of left knee  Rationale for Evaluation and Treatment: Rehabilitation  THERAPY DIAG:  Muscle weakness (generalized)  Chronic pain of left knee  Chronic midline low back pain without sciatica  Pain in right leg  ONSET DATE: a few weeks ago  SUBJECTIVE:  SUBJECTIVE STATEMENT:   Things are feeling so-so, when I bend my knee back I start to have pain. When I have to sit and bend my knee back I still have pain. Martin Majestic out of town this week. Would like to be able to keep dancing. Lower back isn't as bad as the knee but I do feel back pain when I move a certain direction.   PERTINENT HISTORY:  PMH: diabetes, HTN, CAD, seizures, OA and medial meniscus tear of left knee  PAIN:  Are you having pain? No 0/10 at rest, it hurts when I have to do things   PRECAUTIONS: None  WEIGHT BEARING RESTRICTIONS: No  FALLS:  Has patient fallen in last 6 months? No  LIVING ENVIRONMENT: Lives in: House/apartment Stairs: 1 flight on stairs, said he goes up and down  sideways  OCCUPATION: retired -- enjoys dancing, has an event coming up 3/7   PLOF: Independent  PATIENT GOALS: get back to dancing   NEXT MD VISIT:   OBJECTIVE:   DIAGNOSTIC FINDINGS:  IMPRESSION: (for left knee) 1. Oblique tear of the posterior horn of the medial meniscus extending to the inferior articular surface. 2. High-grade partial-thickness cartilage loss of the medial femorotibial compartment with areas of full-thickness cartilage loss of the weight-bearing surface of the medial femoral condyle.  PATIENT SURVEYS:  Eval: FOTO 46% goal 55%  SCREENING FOR RED FLAGS: Bowel or bladder incontinence: No Cauda equina syndrome: No   COGNITION: Overall cognitive status: Within functional limits for tasks assessed     SENSATION: Light touch: WFL   PALPATION: TTP right gluteal region and greater trochanter, left knee joint line and inferior patella No pain or hypomobility noted in lumbar spine with PAMs   LUMBAR ROM:   AROM eval  Flexion WFL   Extension 75%  Right lateral flexion WFL  Left lateral flexion WFL  Right rotation 50%  Left rotation 50%   (Blank rows = not tested)  LOWER EXTREMITY ROM:     Active  Right eval Left eval  Hip flexion    Hip extension    Hip abduction    Hip adduction    Hip internal rotation    Hip external rotation    Knee flexion 121 106  Knee extension 0 2  Ankle dorsiflexion    Ankle plantarflexion    Ankle inversion    Ankle eversion     (Blank rows = not tested)  LOWER EXTREMITY MMT:    MMT tested in sitting Right eval Left eval  Hip flexion 5 4+  Hip extension 4 4  Hip abduction 5 5  Hip adduction    Hip internal rotation    Hip external rotation    Knee flexion 5 4+  Knee extension 5 4  Ankle dorsiflexion    Ankle plantarflexion    Ankle inversion    Ankle eversion     (Blank rows = not tested)  LUMBAR SPECIAL TESTS:  Eval: Slump test: Negative  FUNCTIONAL TESTS:  Eval: 5 times sit to stand:  16.41sec without UE support, decreased weight shift left and some associated knee pain with this    SLS around 20 sec each leg   TODAY'S TREATMENT:   07/19/22  TherEx  Nustep L4 x6 minutes BLEs only Bridge +ABD into red TB x15 3 second holds  Sidelying hip ABD x12 B red TB Walking bridges x10  Lumbar rotation stretch 5x5 seconds B R SKTC 5x5 seconds QL stretch seated edge of mat table  3x30 seconds   Door squats x10 self selected depth  Forward step ups x12 B 6 inch box no UE support     Eval HEP creation and review with demonstration and trial set preformed, see below for details  -Vasopnuematic device X 10 min, medium compression, 34 deg to left knee  PATIENT EDUCATION: Education details: HEP, PT plan of care Person educated: Patient Education method: Explanation, Demonstration, Verbal cues, and Handouts Education comprehension: verbalized understanding and needs further education   HOME EXERCISE PROGRAM: Access Code: [email or web address] URL: [email or web address] Date: 07/14/2022 Prepared by: Elsie Ra  Exercises - Supine Bridge  - 2 x daily - 6 x weekly - 1-2 sets - 10 reps - 5 hold - Supine Straight Leg Raise  - 2 x daily - 6 x weekly - 3 sets - 10 reps - Sit to Stand Without Arm Support  - 2 x daily - 6 x weekly - 1-2 sets - 10 reps - Supine Hamstring Stretch with Strap  - 2 x daily - 6 x weekly - 1 sets - 3 reps - 30 hold - Seated Knee Flexion AAROM  - 2 x daily - 6 x weekly - 1 sets - 10 reps - 5 hold - Hip Extension with Resistance Loop  - 2 x daily - 6 x weekly - 1-2 sets - 10 reps - Standing Hip Abduction with Resistance at Ankles and Counter Support  - 2 x daily - 6 x weekly - 1-2 sets - 10 reps  ASSESSMENT:  CLINICAL IMPRESSION:   Mr. Swatek arrives today doing OK, still having some pain in his knee. Focused exercises on general strengthening and knee ROM as well as hip strengthening for improved regional stability. Does continue to have some  back pain with extension  biased to the R, tried some general lumbar mobility exercise as well. Will continue efforts.    OBJECTIVE IMPAIRMENTS: decreased activity tolerance, difficulty walking, decreased balance, decreased endurance, decreased mobility, decreased ROM, decreased strength, impaired flexibility, impaired LE use, postural dysfunction, and pain.  ACTIVITY LIMITATIONS: bending, lifting, carry, locomotion, cleaning, community activity, driving, and dancing   PERSONAL FACTORS: PMH: diabetes, HTN, CAD, seizures, OA and medial meniscus tear of left knee are also affecting patient's functional outcome.  REHAB POTENTIAL: Good  CLINICAL DECISION MAKING: Evolving/moderate complexity  EVALUATION COMPLEXITY: Moderate    GOALS: Short term PT Goals Target date: 08/11/2022   Pt will be I and compliant with HEP. Baseline:  Goal status: New Pt will decrease pain by 25% overall Baseline: Goal status: New  Long term PT goals Target date:09/08/2022   Pt will improve lumbar ROM to [facility] to improve functional mobility Baseline: Goal status: New Pt will improve  hip/knee strength to at least 5-/5 MMT to improve functional strength Baseline: Goal status: New Pt will improve FOTO to at least 55% functional to show improved function Baseline: Goal status: New Pt will reduce pain to overall less than 2-3/10 with usual activity and dancing Baseline: Goal status: New Pt will reduce 5 times STS in under 13 sec to show improved function Baseline: Goal status: New   PLAN: PT FREQUENCY: 1-2 times per week   PT DURATION: 4-6 weeks  PLANNED INTERVENTIONS (unless contraindicated): aquatic PT, Canalith repositioning, cryotherapy, Electrical stimulation, Iontophoresis with 4 mg/ml dexamethasome, Moist heat, traction, Ultrasound, gait training, Therapeutic exercise, balance training, neuromuscular re-education, patient/family education, prosthetic training, manual techniques, passive ROM, dry  needling, taping, vasopnuematic device, vestibular, spinal manipulations, joint manipulations  PLAN FOR NEXT  SESSION: review HEP, how has the tingling been? General LE strengthening, stairs   Deniece Ree PT DPT PN2

## 2022-07-21 ENCOUNTER — Ambulatory Visit: Payer: PPO | Admitting: Physical Therapy

## 2022-07-21 ENCOUNTER — Encounter: Payer: Self-pay | Admitting: Physical Therapy

## 2022-07-21 DIAGNOSIS — G8929 Other chronic pain: Secondary | ICD-10-CM | POA: Diagnosis not present

## 2022-07-21 DIAGNOSIS — M6281 Muscle weakness (generalized): Secondary | ICD-10-CM | POA: Diagnosis not present

## 2022-07-21 DIAGNOSIS — M79604 Pain in right leg: Secondary | ICD-10-CM | POA: Diagnosis not present

## 2022-07-21 DIAGNOSIS — M25562 Pain in left knee: Secondary | ICD-10-CM | POA: Diagnosis not present

## 2022-07-21 DIAGNOSIS — M545 Low back pain, unspecified: Secondary | ICD-10-CM

## 2022-07-21 NOTE — Therapy (Signed)
OUTPATIENT PHYSICAL THERAPY THORACOLUMBAR/KNEE TREATMENT    Patient Name: Nicholas Rasmussen MRN: [id number] DOB:August 06, 1945, 77 y.o., male Today's Date: 07/21/2022  END OF SESSION:  PT End of Session - 07/21/22 1128     Visit Number 3    Number of Visits 12    Date for PT Re-Evaluation 08/25/22    Authorization Type [employer]    PT Start Time 1101    PT Stop Time 1140    PT Time Calculation (min) 39 min    Activity Tolerance Patient tolerated treatment well    Behavior During Therapy [facility] for tasks assessed/performed            Past Medical History:  Diagnosis Date   Allergic rhinitis 05/07/2014   Diabetes mellitus without complication ([place])    Dizziness 01/02/2016   GERD (gastroesophageal reflux disease)    Headache 03/13/2015   Left flank pain 01/02/2016   Low back pain 06/06/2015   Pain of left side of body 08/13/2015   Post herpetic neuralgia    Routine general medical examination at a health care facility 05/07/2014   Seizure disorder ([place]) 05/07/2014   Seizures ([place])    Last seizure in the 70s.   Shingles    Stye 05/07/2014   Past Surgical History:  Procedure Laterality Date   COLONOSCOPY     KNEE SURGERY Left    left miniscus   UPPER GASTROINTESTINAL ENDOSCOPY     uvala surgery     Patient Active Problem List   Diagnosis Date Noted   Acute medial meniscus tear of left knee 02/04/2022   Primary osteoarthritis of left knee 02/04/2022   Aortic root dilatation (HCC) 12/18/2021   Slow transit constipation 12/08/2021   Chest wall muscle strain 12/08/2021   Multiple pulmonary nodules 10/06/2021   Hyperlipidemia associated with type 2 diabetes mellitus ([place]) 08/19/2021   [name] 04/07/2021   Complex renal cyst 04/07/2021   Varicose veins of both lower extremities with pain 02/02/2019   Encounter for therapeutic drug level monitoring 09/19/2018   Type 2 diabetes mellitus with diabetic polyneuropathy,  without long-term current use of insulin ([place]) 03/29/2018   Diabetic neuropathy ([place]) 03/29/2018   Chronic pain of left knee 03/23/2018   Coronary artery disease involving native coronary artery of native heart with angina pectoris ([place]) 08/03/2017   Post herpetic neuralgia 07/29/2016   Dizziness 01/02/2016   Chronic midline low back pain without sciatica 06/06/2015   Nonintractable episodic headache 03/13/2015   GERD (gastroesophageal reflux disease) 11/15/2014   Seizure disorder ([place]) 05/07/2014   Allergic rhinitis 05/07/2014    PCP: Flossie Buffy, NP  REFERRING PROVIDER: Flossie Buffy, NP  REFERRING DIAG: M54.50,G89.29 (ICD-10-CM) - Chronic midline low back pain without sciatica M25.562,G89.29 (ICD-10-CM) - Chronic pain of left knee  Rationale for Evaluation and Treatment: Rehabilitation  THERAPY DIAG:  Chronic midline low back pain without sciatica  Muscle weakness (generalized)  Chronic pain of left knee  Pain in right leg  ONSET DATE: a few weeks ago  SUBJECTIVE:  SUBJECTIVE STATEMENT:   Things are feeling so-so, when I bend my knee back I start to have pain. When I have to sit and bend my knee back I still have pain. Martin Majestic out of town this week. Would like to be able to keep dancing. Lower back isn't as bad as the knee but I do feel back pain when I move a certain direction.   PERTINENT HISTORY:  PMH: diabetes, HTN, CAD, seizures, OA and medial meniscus tear of left knee  PAIN:  Are you having pain? No 0/10 at rest, it hurts when I have to do things   PRECAUTIONS: None  WEIGHT BEARING RESTRICTIONS: No  FALLS:  Has patient fallen in last 6 months? No  LIVING ENVIRONMENT: Lives in: House/apartment Stairs: 1 flight on stairs, said he goes up and down  sideways  OCCUPATION: retired -- enjoys dancing, has an event coming up 3/7   PLOF: Independent  PATIENT GOALS: get back to dancing   NEXT MD VISIT:   OBJECTIVE:   DIAGNOSTIC FINDINGS:  IMPRESSION: (for left knee) 1. Oblique tear of the posterior horn of the medial meniscus extending to the inferior articular surface. 2. High-grade partial-thickness cartilage loss of the medial femorotibial compartment with areas of full-thickness cartilage loss of the weight-bearing surface of the medial femoral condyle.  PATIENT SURVEYS:  Eval: FOTO 46% goal 55%  SCREENING FOR RED FLAGS: Bowel or bladder incontinence: No Cauda equina syndrome: No   COGNITION: Overall cognitive status: Within functional limits for tasks assessed     SENSATION: Light touch: WFL   PALPATION: TTP right gluteal region and greater trochanter, left knee joint line and inferior patella No pain or hypomobility noted in lumbar spine with PAMs   LUMBAR ROM:   AROM eval  Flexion WFL   Extension 75%  Right lateral flexion WFL  Left lateral flexion WFL  Right rotation 50%  Left rotation 50%   (Blank rows = not tested)  LOWER EXTREMITY ROM:     Active  Right eval Left eval  Hip flexion    Hip extension    Hip abduction    Hip adduction    Hip internal rotation    Hip external rotation    Knee flexion 121 106  Knee extension 0 2  Ankle dorsiflexion    Ankle plantarflexion    Ankle inversion    Ankle eversion     (Blank rows = not tested)  LOWER EXTREMITY MMT:    MMT tested in sitting Right eval Left eval  Hip flexion 5 4+  Hip extension 4 4  Hip abduction 5 5  Hip adduction    Hip internal rotation    Hip external rotation    Knee flexion 5 4+  Knee extension 5 4  Ankle dorsiflexion    Ankle plantarflexion    Ankle inversion    Ankle eversion     (Blank rows = not tested)  LUMBAR SPECIAL TESTS:  Eval: Slump test: Negative  FUNCTIONAL TESTS:  Eval: 5 times sit to stand:  16.41sec without UE support, decreased weight shift left and some associated knee pain with this    SLS around 20 sec each leg   TODAY'S TREATMENT:  07/21/2022: TherEx  Nustep L4 x6 minutes BLEs only Bridge +ABD into red TB x15 3 second holds  Sidelying hip ABD x12 B red TB Walking bridges x10  Lumbar rotation stretch 5x5 seconds B R SKTC 5x5 seconds QL stretch seated edge of mat table 3x30 seconds  Squats at low mat x15 self selected depth  Forward/ lateral step ups x12 B 6 inch box no UE support   07/19/22  TherEx  Nustep L4 x6 minutes BLEs only Bridge +ABD into red TB x15 3 second holds  Sidelying hip ABD x12 B red TB Walking bridges x10  Lumbar rotation stretch 5x5 seconds B R SKTC 5x5 seconds QL stretch seated edge of mat table 3x30 seconds   Door squats x10 self selected depth  Forward step ups x12 B 6 inch box no UE support     Eval HEP creation and review with demonstration and trial set preformed, see below for details  -Vasopnuematic device X 10 min, medium compression, 34 deg to left knee  PATIENT EDUCATION: Education details: HEP, PT plan of care Person educated: Patient Education method: Explanation, Demonstration, Verbal cues, and Handouts Education comprehension: verbalized understanding and needs further education   HOME EXERCISE PROGRAM: Access Code: [email or web address] URL: [email or web address] Date: 07/14/2022 Prepared by: Elsie Ra  Exercises - Supine Bridge  - 2 x daily - 6 x weekly - 1-2 sets - 10 reps - 5 hold - Supine Straight Leg Raise  - 2 x daily - 6 x weekly - 3 sets - 10 reps - Sit to Stand Without Arm Support  - 2 x daily - 6 x weekly - 1-2 sets - 10 reps - Supine Hamstring Stretch with Strap  - 2 x daily - 6 x weekly - 1 sets - 3 reps - 30 hold - Seated Knee Flexion AAROM  - 2 x daily - 6 x weekly - 1 sets - 10 reps - 5 hold - Hip Extension with Resistance Loop  - 2 x daily - 6 x weekly - 1-2 sets - 10 reps - Standing  Hip Abduction with Resistance at Ankles and Counter Support  - 2 x daily - 6 x weekly - 1-2 sets - 10 reps  ASSESSMENT:  CLINICAL IMPRESSION:   Mr. Wojick arrives today doing OK, still having some pain in his knee. Focused exercises on general strengthening and knee ROM as well as hip strengthening for improved regional stability. Discussed inquiring about gel shots in knee due to reports of residual pain with pivoting motions with dancing, getting in/out of car. Does continue to have some back pain with extension biased to the R, tried some general lumbar mobility exercise as well. Pt encouraged to stretch QL intermittently and adjust sitting posture to alleviate LBP.   OBJECTIVE IMPAIRMENTS: decreased activity tolerance, difficulty walking, decreased balance, decreased endurance, decreased mobility, decreased ROM, decreased strength, impaired flexibility, impaired LE use, postural dysfunction, and pain.  ACTIVITY LIMITATIONS: bending, lifting, carry, locomotion, cleaning, community activity, driving, and dancing   PERSONAL FACTORS: PMH: diabetes, HTN, CAD, seizures, OA and medial meniscus tear of left knee are also affecting patient's functional outcome.  REHAB POTENTIAL: Good  CLINICAL DECISION MAKING: Evolving/moderate complexity  EVALUATION COMPLEXITY: Moderate    GOALS: Short term PT Goals Target date: 08/11/2022   Pt will be I and compliant with HEP. Baseline:  Goal status: New Pt will decrease pain by 25% overall Baseline: Goal status: New  Long term PT goals Target date:09/08/2022   Pt will improve lumbar ROM to [facility] to improve functional mobility Baseline: Goal status: New Pt will improve  hip/knee strength to at least 5-/5 MMT to improve functional strength Baseline: Goal status: New Pt will improve FOTO to at least 55% functional to show improved function Baseline: Goal status: New Pt will  reduce pain to overall less than 2-3/10 with usual activity and  dancing Baseline: Goal status: New Pt will reduce 5 times STS in under 13 sec to show improved function Baseline: Goal status: New   PLAN: PT FREQUENCY: 1-2 times per week   PT DURATION: 4-6 weeks  PLANNED INTERVENTIONS (unless contraindicated): aquatic PT, Canalith repositioning, cryotherapy, Electrical stimulation, Iontophoresis with 4 mg/ml dexamethasome, Moist heat, traction, Ultrasound, gait training, Therapeutic exercise, balance training, neuromuscular re-education, patient/family education, prosthetic training, manual techniques, passive ROM, dry needling, taping, vasopnuematic device, vestibular, spinal manipulations, joint manipulations  PLAN FOR NEXT SESSION: review HEP, how has the tingling been? General LE strengthening, stairs   Rudi Heap PT, DPT 07/21/22  11:44 AM

## 2022-07-26 NOTE — Therapy (Unsigned)
OUTPATIENT PHYSICAL THERAPY THORACOLUMBAR/KNEE TREATMENT    Patient Name: Nicholas Rasmussen MRN: [id number] DOB:1945/12/29, 77 y.o., male Today's Date: 07/27/2022  END OF SESSION:  PT End of Session - 07/27/22 1144     Visit Number 4    Number of Visits 12    Date for PT Re-Evaluation 08/25/22    Authorization Type [employer]    PT Start Time 1140    PT Stop Time 1225    PT Time Calculation (min) 45 min    Activity Tolerance Patient tolerated treatment well    Behavior During Therapy [facility] for tasks assessed/performed             Past Medical History:  Diagnosis Date   Allergic rhinitis 05/07/2014   Diabetes mellitus without complication ([place])    Dizziness 01/02/2016   GERD (gastroesophageal reflux disease)    Headache 03/13/2015   Left flank pain 01/02/2016   Low back pain 06/06/2015   Pain of left side of body 08/13/2015   Post herpetic neuralgia    Routine general medical examination at a health care facility 05/07/2014   Seizure disorder ([place]) 05/07/2014   Seizures ([place])    Last seizure in the 70s.   Shingles    Stye 05/07/2014   Past Surgical History:  Procedure Laterality Date   COLONOSCOPY     KNEE SURGERY Left    left miniscus   UPPER GASTROINTESTINAL ENDOSCOPY     uvala surgery     Patient Active Problem List   Diagnosis Date Noted   Acute medial meniscus tear of left knee 02/04/2022   Primary osteoarthritis of left knee 02/04/2022   Aortic root dilatation (HCC) 12/18/2021   Slow transit constipation 12/08/2021   Chest wall muscle strain 12/08/2021   Multiple pulmonary nodules 10/06/2021   Hyperlipidemia associated with type 2 diabetes mellitus ([place]) 08/19/2021   [name] 04/07/2021   Complex renal cyst 04/07/2021   Varicose veins of both lower extremities with pain 02/02/2019   Encounter for therapeutic drug level monitoring 09/19/2018   Type 2 diabetes mellitus with diabetic polyneuropathy,  without long-term current use of insulin ([place]) 03/29/2018   Diabetic neuropathy ([place]) 03/29/2018   Chronic pain of left knee 03/23/2018   Coronary artery disease involving native coronary artery of native heart with angina pectoris ([place]) 08/03/2017   Post herpetic neuralgia 07/29/2016   Dizziness 01/02/2016   Chronic midline low back pain without sciatica 06/06/2015   Nonintractable episodic headache 03/13/2015   GERD (gastroesophageal reflux disease) 11/15/2014   Seizure disorder ([place]) 05/07/2014   Allergic rhinitis 05/07/2014    PCP: Flossie Buffy, NP  REFERRING PROVIDER: Flossie Buffy, NP  REFERRING DIAG: M54.50,G89.29 (ICD-10-CM) - Chronic midline low back pain without sciatica M25.562,G89.29 (ICD-10-CM) - Chronic pain of left knee  Rationale for Evaluation and Treatment: Rehabilitation  THERAPY DIAG:  Chronic midline low back pain without sciatica  Muscle weakness (generalized)  Chronic pain of left knee  Pain in right leg  ONSET DATE: a few weeks ago  SUBJECTIVE:  SUBJECTIVE STATEMENT:   My L knee is feeling a little better today, but my R knee is hurting. "I probably need to get it x-rayed". I am going out of town for the next 6 days for a dancing convention.     PERTINENT HISTORY:  PMH: diabetes, HTN, CAD, seizures, OA and medial meniscus tear of left knee  PAIN:  Are you having pain? No 0/10 at rest, it hurts when I have to do things   PRECAUTIONS: None  WEIGHT BEARING RESTRICTIONS: No  FALLS:  Has patient fallen in last 6 months? No  LIVING ENVIRONMENT: Lives in: House/apartment Stairs: 1 flight on stairs, said he goes up and down sideways  OCCUPATION: retired -- enjoys dancing, has an event coming up 3/7   PLOF: Independent  PATIENT GOALS: get back to  dancing   NEXT MD VISIT:   OBJECTIVE:   DIAGNOSTIC FINDINGS:  IMPRESSION: (for left knee) 1. Oblique tear of the posterior horn of the medial meniscus extending to the inferior articular surface. 2. High-grade partial-thickness cartilage loss of the medial femorotibial compartment with areas of full-thickness cartilage loss of the weight-bearing surface of the medial femoral condyle.  PATIENT SURVEYS:  Eval: FOTO 46% goal 55%  SCREENING FOR RED FLAGS: Bowel or bladder incontinence: No Cauda equina syndrome: No   COGNITION: Overall cognitive status: Within functional limits for tasks assessed     SENSATION: Light touch: WFL   PALPATION: TTP right gluteal region and greater trochanter, left knee joint line and inferior patella No pain or hypomobility noted in lumbar spine with PAMs   LUMBAR ROM:   AROM eval  Flexion WFL   Extension 75%  Right lateral flexion WFL  Left lateral flexion WFL  Right rotation 50%  Left rotation 50%   (Blank rows = not tested)  LOWER EXTREMITY ROM:     Active  Right eval Left eval  Hip flexion    Hip extension    Hip abduction    Hip adduction    Hip internal rotation    Hip external rotation    Knee flexion 121 106  Knee extension 0 2  Ankle dorsiflexion    Ankle plantarflexion    Ankle inversion    Ankle eversion     (Blank rows = not tested)  LOWER EXTREMITY MMT:    MMT tested in sitting Right eval Left eval  Hip flexion 5 4+  Hip extension 4 4  Hip abduction 5 5  Hip adduction    Hip internal rotation    Hip external rotation    Knee flexion 5 4+  Knee extension 5 4  Ankle dorsiflexion    Ankle plantarflexion    Ankle inversion    Ankle eversion     (Blank rows = not tested)  LUMBAR SPECIAL TESTS:  Eval: Slump test: Negative  FUNCTIONAL TESTS:  Eval: 5 times sit to stand: 16.41sec without UE support, decreased weight shift left and some associated knee pain with this    SLS around 20 sec each leg    TODAY'S TREATMENT:  07/27/2022: TherEx  Nustep L4 x6 minutes BLEs only Bridge +ABD into red TB x20 3 second holds  Sidelying hip ABD x12 B red TB Supine marches with RTB x20  Supine clams with RTB 2x10  Walking bridges x10  LAQ 15x, bilat. VC for slow eccentric control.  Lumbar rotation stretch 5x5 seconds B R SKTC 5x5 seconds QL stretch seated edge of mat table 3x30 seconds   Squats  at low mat x20 self selected depth  Pt requested game ready to R knee at end of session today. Low compression, 10 min.   07/21/2022: TherEx  Nustep L4 x6 minutes BLEs only Bridge +ABD into red TB x20 3 second holds  Sidelying hip ABD x12 B red TB Walking bridges x10  Lumbar rotation stretch 5x5 seconds B R SKTC 5x5 seconds QL stretch seated edge of mat table 3x30 seconds   Squats at low mat x15 self selected depth  Forward/ lateral step ups x12 B 6 inch box no UE support   07/19/22  TherEx  Nustep L4 x6 minutes BLEs only Bridge +ABD into red TB x15 3 second holds  Sidelying hip ABD x12 B red TB Walking bridges x10  Lumbar rotation stretch 5x5 seconds B R SKTC 5x5 seconds QL stretch seated edge of mat table 3x30 seconds   Door squats x10 self selected depth  Forward step ups x12 B 6 inch box no UE support     Eval HEP creation and review with demonstration and trial set preformed, see below for details  -Vasopnuematic device X 10 min, medium compression, 34 deg to left knee  PATIENT EDUCATION: Education details: HEP, PT plan of care Person educated: Patient Education method: Explanation, Demonstration, Verbal cues, and Handouts Education comprehension: verbalized understanding and needs further education   HOME EXERCISE PROGRAM: Access Code: [email or web address] URL: [email or web address] Date: 07/14/2022 Prepared by: Elsie Ra  Exercises - Supine Bridge  - 2 x daily - 6 x weekly - 1-2 sets - 10 reps - 5 hold - Supine Straight Leg Raise  - 2 x daily - 6 x weekly  - 3 sets - 10 reps - Sit to Stand Without Arm Support  - 2 x daily - 6 x weekly - 1-2 sets - 10 reps - Supine Hamstring Stretch with Strap  - 2 x daily - 6 x weekly - 1 sets - 3 reps - 30 hold - Seated Knee Flexion AAROM  - 2 x daily - 6 x weekly - 1 sets - 10 reps - 5 hold - Hip Extension with Resistance Loop  - 2 x daily - 6 x weekly - 1-2 sets - 10 reps - Standing Hip Abduction with Resistance at Ankles and Counter Support  - 2 x daily - 6 x weekly - 1-2 sets - 10 reps  ASSESSMENT:  CLINICAL IMPRESSION:   Mr. Peskin arrives today doing OK, still having some pain in his bila knees with R>L today. Session continued to Focus on general strengthening and knee ROM as well as hip strengthening for improved regional stability. Does continue to have some back pain with extension biased to the R, tried some general lumbar mobility exercise as well. Pt encouraged to stretch QL intermittently and adjust sitting posture to alleviate LBP. Ended session with game ready per pt request today.    OBJECTIVE IMPAIRMENTS: decreased activity tolerance, difficulty walking, decreased balance, decreased endurance, decreased mobility, decreased ROM, decreased strength, impaired flexibility, impaired LE use, postural dysfunction, and pain.  ACTIVITY LIMITATIONS: bending, lifting, carry, locomotion, cleaning, community activity, driving, and dancing   PERSONAL FACTORS: PMH: diabetes, HTN, CAD, seizures, OA and medial meniscus tear of left knee are also affecting patient's functional outcome.  REHAB POTENTIAL: Good  CLINICAL DECISION MAKING: Evolving/moderate complexity  EVALUATION COMPLEXITY: Moderate    GOALS: Short term PT Goals Target date: 08/11/2022     Pt will be I and compliant with HEP. Baseline:  Goal status: New  Pt will decrease pain by 25% overall Baseline: Goal status: New   Long term PT goals Target date:09/08/2022     Pt will improve lumbar ROM to [facility] to improve functional  mobility Baseline: Goal status: New Pt will improve  hip/knee strength to at least 5-/5 MMT to improve functional strength Baseline: Goal status: New Pt will improve FOTO to at least 55% functional to show improved function Baseline: Goal status: New Pt will reduce pain to overall less than 2-3/10 with usual activity and dancing Baseline: Goal status: New Pt will reduce 5 times STS in under 13 sec to show improved function Baseline: Goal status: New  PLAN: PT FREQUENCY: 1-2 times per week   PT DURATION: 4-6 weeks  PLANNED INTERVENTIONS (unless contraindicated): aquatic PT, Canalith repositioning, cryotherapy, Electrical stimulation, Iontophoresis with 4 mg/ml dexamethasome, Moist heat, traction, Ultrasound, gait training, Therapeutic exercise, balance training, neuromuscular re-education, patient/family education, prosthetic training, manual techniques, passive ROM, dry needling, taping, vasopnuematic device, vestibular, spinal manipulations, joint manipulations  PLAN FOR NEXT SESSION: review HEP, how has the tingling been? General LE strengthening, stairs   Rudi Heap PT, DPT 07/27/22  12:17 PM

## 2022-07-27 ENCOUNTER — Telehealth: Payer: Self-pay | Admitting: Nurse Practitioner

## 2022-07-27 ENCOUNTER — Encounter: Payer: Self-pay | Admitting: Physical Therapy

## 2022-07-27 ENCOUNTER — Ambulatory Visit: Payer: PPO | Admitting: Physical Therapy

## 2022-07-27 DIAGNOSIS — M545 Low back pain, unspecified: Secondary | ICD-10-CM | POA: Diagnosis not present

## 2022-07-27 DIAGNOSIS — G8929 Other chronic pain: Secondary | ICD-10-CM

## 2022-07-27 DIAGNOSIS — M25562 Pain in left knee: Secondary | ICD-10-CM | POA: Diagnosis not present

## 2022-07-27 DIAGNOSIS — M6281 Muscle weakness (generalized): Secondary | ICD-10-CM

## 2022-07-27 DIAGNOSIS — M79604 Pain in right leg: Secondary | ICD-10-CM

## 2022-07-27 NOTE — Telephone Encounter (Signed)
Called patient back and he wanted to know what the two supplements were for.

## 2022-07-27 NOTE — Telephone Encounter (Signed)
Pt was given 2 meds he would like to talk to you about. He stated a nutritionist prescribed them. Culture probiotic Nature Made Vit D3 5000 units.

## 2022-07-29 ENCOUNTER — Encounter: Payer: Self-pay | Admitting: Physical Therapy

## 2022-08-02 NOTE — Therapy (Unsigned)
OUTPATIENT PHYSICAL THERAPY THORACOLUMBAR/KNEE TREATMENT    Patient Name: Nicholas Rasmussen MRN: [id number] DOB:15-Apr-1946, 77 y.o., male Today's Date: 08/05/2022  END OF SESSION:  PT End of Session - 08/05/22 1128     Visit Number 5    Number of Visits 12    Date for PT Re-Evaluation 08/25/22    Authorization Type [employer]    PT Start Time 1103    PT Stop Time 1143    PT Time Calculation (min) 40 min    Activity Tolerance Patient tolerated treatment well    Behavior During Therapy [facility] for tasks assessed/performed              Past Medical History:  Diagnosis Date   Allergic rhinitis 05/07/2014   Diabetes mellitus without complication ([place])    Dizziness 01/02/2016   GERD (gastroesophageal reflux disease)    Headache 03/13/2015   Left flank pain 01/02/2016   Low back pain 06/06/2015   Pain of left side of body 08/13/2015   Post herpetic neuralgia    Routine general medical examination at a health care facility 05/07/2014   Seizure disorder ([place]) 05/07/2014   Seizures ([place])    Last seizure in the 70s.   Shingles    Stye 05/07/2014   Past Surgical History:  Procedure Laterality Date   COLONOSCOPY     KNEE SURGERY Left    left miniscus   UPPER GASTROINTESTINAL ENDOSCOPY     uvala surgery     Patient Active Problem List   Diagnosis Date Noted   Acute medial meniscus tear of left knee 02/04/2022   Primary osteoarthritis of left knee 02/04/2022   Aortic root dilatation (HCC) 12/18/2021   Slow transit constipation 12/08/2021   Chest wall muscle strain 12/08/2021   Multiple pulmonary nodules 10/06/2021   Hyperlipidemia associated with type 2 diabetes mellitus ([place]) 08/19/2021   [name] 04/07/2021   Complex renal cyst 04/07/2021   Varicose veins of both lower extremities with pain 02/02/2019   Encounter for therapeutic drug level monitoring 09/19/2018   Type 2 diabetes mellitus with diabetic polyneuropathy,  without long-term current use of insulin ([place]) 03/29/2018   Diabetic neuropathy ([place]) 03/29/2018   Chronic pain of left knee 03/23/2018   Coronary artery disease involving native coronary artery of native heart with angina pectoris ([place]) 08/03/2017   Post herpetic neuralgia 07/29/2016   Dizziness 01/02/2016   Chronic midline low back pain without sciatica 06/06/2015   Nonintractable episodic headache 03/13/2015   GERD (gastroesophageal reflux disease) 11/15/2014   Seizure disorder ([place]) 05/07/2014   Allergic rhinitis 05/07/2014    PCP: Flossie Buffy, NP  REFERRING PROVIDER: Flossie Buffy, NP  REFERRING DIAG: M54.50,G89.29 (ICD-10-CM) - Chronic midline low back pain without sciatica M25.562,G89.29 (ICD-10-CM) - Chronic pain of left knee  Rationale for Evaluation and Treatment: Rehabilitation  THERAPY DIAG:  Chronic midline low back pain without sciatica  Muscle weakness (generalized)  Chronic pain of left knee  ONSET DATE: a few weeks ago  SUBJECTIVE:  SUBJECTIVE STATEMENT:   Pt arrives to PT following a dancing convention. He states that he had intermittent pain, but it was tolerable. He is going to [facility] on April 14th and will be walking a lot. He states that he would like to be able to walk without pain if possible. Pt is 6/10 pain today.    PERTINENT HISTORY:  PMH: diabetes, HTN, CAD, seizures, OA and medial meniscus tear of left knee  PAIN:  Are you having pain? No 0/10 at rest, it hurts when I have to do things   PRECAUTIONS: None  WEIGHT BEARING RESTRICTIONS: No  FALLS:  Has patient fallen in last 6 months? No  LIVING ENVIRONMENT: Lives in: House/apartment Stairs: 1 flight on stairs, said he goes up and down sideways  OCCUPATION: retired -- enjoys dancing,  has an event coming up 3/7   PLOF: Independent  PATIENT GOALS: get back to dancing   NEXT MD VISIT:   OBJECTIVE:   DIAGNOSTIC FINDINGS:  IMPRESSION: (for left knee) 1. Oblique tear of the posterior horn of the medial meniscus extending to the inferior articular surface. 2. High-grade partial-thickness cartilage loss of the medial femorotibial compartment with areas of full-thickness cartilage loss of the weight-bearing surface of the medial femoral condyle.  PATIENT SURVEYS:  Eval: FOTO 46% goal 55%  SCREENING FOR RED FLAGS: Bowel or bladder incontinence: No Cauda equina syndrome: No   COGNITION: Overall cognitive status: Within functional limits for tasks assessed     SENSATION: Light touch: WFL   PALPATION: TTP right gluteal region and greater trochanter, left knee joint line and inferior patella No pain or hypomobility noted in lumbar spine with PAMs   LUMBAR ROM:   AROM eval  Flexion WFL   Extension 75%  Right lateral flexion WFL  Left lateral flexion WFL  Right rotation 50%  Left rotation 50%   (Blank rows = not tested)  LOWER EXTREMITY ROM:     Active  Right eval Left eval  Hip flexion    Hip extension    Hip abduction    Hip adduction    Hip internal rotation    Hip external rotation    Knee flexion 121 106  Knee extension 0 2  Ankle dorsiflexion    Ankle plantarflexion    Ankle inversion    Ankle eversion     (Blank rows = not tested)  LOWER EXTREMITY MMT:    MMT tested in sitting Right eval Left eval  Hip flexion 5 4+  Hip extension 4 4  Hip abduction 5 5  Hip adduction    Hip internal rotation    Hip external rotation    Knee flexion 5 4+  Knee extension 5 4  Ankle dorsiflexion    Ankle plantarflexion    Ankle inversion    Ankle eversion     (Blank rows = not tested)  LUMBAR SPECIAL TESTS:  Eval: Slump test: Negative  FUNCTIONAL TESTS:  Eval: 5 times sit to stand: 16.41sec without UE support, decreased weight shift  left and some associated knee pain with this    SLS around 20 sec each leg   TODAY'S TREATMENT:  08/05/2022: TherEx  Nustep L5 x6 minutes BLEs only Bridge +ABD into red TB x20 3 second holds  Sidelying hip ABD x12 B red TB Supine marches with RTB x20  Supine clams with RTB 2x10  Walking bridges x10  LAQ 15x, bilat. VC for slow eccentric control.  Lumbar rotation stretch 5x5 seconds B Modified  thomas stretch on L LE 2x30 sec hold. PT assist R SKTC 5x5 seconds Squats at low mat x20 self selected depth  Pt requested game ready to L knee at end of session today. Low compression, 10 min.   07/27/2022: TherEx  Nustep L4 x6 minutes BLEs only Bridge +ABD into red TB x20 3 second holds  Sidelying hip ABD x12 B red TB Supine marches with RTB x20  Supine clams with RTB 2x10  Walking bridges x10  LAQ 15x, bilat. VC for slow eccentric control.  Lumbar rotation stretch 5x5 seconds B R SKTC 5x5 seconds QL stretch seated edge of mat table 3x30 seconds   Squats at low mat x20 self selected depth  Pt requested game ready to R knee at end of session today. Low compression, 10 min.   07/21/2022: TherEx  Nustep L4 x6 minutes BLEs only Bridge +ABD into red TB x20 3 second holds  Sidelying hip ABD x12 B red TB Walking bridges x10  Lumbar rotation stretch 5x5 seconds B R SKTC 5x5 seconds QL stretch seated edge of mat table 3x30 seconds   Squats at low mat x15 self selected depth  Forward/ lateral step ups x12 B 6 inch box no UE support   07/19/22  TherEx  Nustep L4 x6 minutes BLEs only Bridge +ABD into red TB x15 3 second holds  Sidelying hip ABD x12 B red TB Walking bridges x10  Lumbar rotation stretch 5x5 seconds B R SKTC 5x5 seconds QL stretch seated edge of mat table 3x30 seconds   Door squats x10 self selected depth  Forward step ups x12 B 6 inch box no UE support     Eval HEP creation and review with demonstration and trial set preformed, see below for  details  -Vasopnuematic device X 10 min, medium compression, 34 deg to left knee  PATIENT EDUCATION: Education details: HEP, PT plan of care Person educated: Patient Education method: Explanation, Demonstration, Verbal cues, and Handouts Education comprehension: verbalized understanding and needs further education   HOME EXERCISE PROGRAM: Access Code: [email or web address] URL: [email or web address] Date: 07/14/2022 Prepared by: Elsie Ra  Exercises - Supine Bridge  - 2 x daily - 6 x weekly - 1-2 sets - 10 reps - 5 hold - Supine Straight Leg Raise  - 2 x daily - 6 x weekly - 3 sets - 10 reps - Sit to Stand Without Arm Support  - 2 x daily - 6 x weekly - 1-2 sets - 10 reps - Supine Hamstring Stretch with Strap  - 2 x daily - 6 x weekly - 1 sets - 3 reps - 30 hold - Seated Knee Flexion AAROM  - 2 x daily - 6 x weekly - 1 sets - 10 reps - 5 hold - Hip Extension with Resistance Loop  - 2 x daily - 6 x weekly - 1-2 sets - 10 reps - Standing Hip Abduction with Resistance at Ankles and Counter Support  - 2 x daily - 6 x weekly - 1-2 sets - 10 reps  ASSESSMENT:  CLINICAL IMPRESSION:   Mr. Avetisyan arrives today doing OK, still having some pain in his bila knees with L>R today. Session continued to Focus on general strengthening and knee ROM as well as hip strengthening for improved regional stability.  Ended session with game ready per pt request today.    OBJECTIVE IMPAIRMENTS: decreased activity tolerance, difficulty walking, decreased balance, decreased endurance, decreased mobility, decreased ROM, decreased strength, impaired flexibility, impaired LE use, postural  dysfunction, and pain.  ACTIVITY LIMITATIONS: bending, lifting, carry, locomotion, cleaning, community activity, driving, and dancing   PERSONAL FACTORS: PMH: diabetes, HTN, CAD, seizures, OA and medial meniscus tear of left knee are also affecting patient's functional outcome.  REHAB POTENTIAL: Good  CLINICAL  DECISION MAKING: Evolving/moderate complexity  EVALUATION COMPLEXITY: Moderate    GOALS: Short term PT Goals Target date: 08/11/2022     Pt will be I and compliant with HEP. Baseline:  Goal status: New Pt will decrease pain by 25% overall Baseline: Goal status: New   Long term PT goals Target date:09/08/2022     Pt will improve lumbar ROM to [facility] to improve functional mobility Baseline: Goal status: New Pt will improve  hip/knee strength to at least 5-/5 MMT to improve functional strength Baseline: Goal status: New Pt will improve FOTO to at least 55% functional to show improved function Baseline: Goal status: New Pt will reduce pain to overall less than 2-3/10 with usual activity and dancing Baseline: Goal status: New Pt will reduce 5 times STS in under 13 sec to show improved function Baseline: Goal status: New  PLAN: PT FREQUENCY: 1-2 times per week   PT DURATION: 4-6 weeks  PLANNED INTERVENTIONS (unless contraindicated): aquatic PT, Canalith repositioning, cryotherapy, Electrical stimulation, Iontophoresis with 4 mg/ml dexamethasome, Moist heat, traction, Ultrasound, gait training, Therapeutic exercise, balance training, neuromuscular re-education, patient/family education, prosthetic training, manual techniques, passive ROM, dry needling, taping, vasopnuematic device, vestibular, spinal manipulations, joint manipulations  PLAN FOR NEXT SESSION: review HEP, how has the tingling been? General LE strengthening, stairs   Rudi Heap PT, DPT 08/05/22  11:37 AM

## 2022-08-03 ENCOUNTER — Encounter: Payer: PPO | Admitting: Physical Therapy

## 2022-08-03 ENCOUNTER — Other Ambulatory Visit: Payer: PPO

## 2022-08-05 ENCOUNTER — Ambulatory Visit: Payer: PPO | Admitting: Physical Therapy

## 2022-08-05 ENCOUNTER — Encounter: Payer: Self-pay | Admitting: Physical Therapy

## 2022-08-05 DIAGNOSIS — G8929 Other chronic pain: Secondary | ICD-10-CM

## 2022-08-05 DIAGNOSIS — M6281 Muscle weakness (generalized): Secondary | ICD-10-CM

## 2022-08-05 DIAGNOSIS — M25562 Pain in left knee: Secondary | ICD-10-CM | POA: Diagnosis not present

## 2022-08-05 DIAGNOSIS — M545 Low back pain, unspecified: Secondary | ICD-10-CM

## 2022-08-10 ENCOUNTER — Encounter: Payer: Self-pay | Admitting: Physical Therapy

## 2022-08-10 ENCOUNTER — Ambulatory Visit: Payer: PPO | Admitting: Physical Therapy

## 2022-08-10 DIAGNOSIS — M545 Low back pain, unspecified: Secondary | ICD-10-CM

## 2022-08-10 DIAGNOSIS — G8929 Other chronic pain: Secondary | ICD-10-CM | POA: Diagnosis not present

## 2022-08-10 DIAGNOSIS — M6281 Muscle weakness (generalized): Secondary | ICD-10-CM

## 2022-08-10 DIAGNOSIS — M25562 Pain in left knee: Secondary | ICD-10-CM

## 2022-08-10 NOTE — Therapy (Signed)
OUTPATIENT PHYSICAL THERAPY THORACOLUMBAR/KNEE TREATMENT    Patient Name: Nicholas Rasmussen MRN: [id number] DOB:11/17/1945, 77 y.o., male Today's Date: 08/10/2022  END OF SESSION:  PT End of Session - 08/10/22 1348     Visit Number 6    Number of Visits 12    Date for PT Re-Evaluation 08/25/22    Authorization Type [employer]    PT Start Time 1347    PT Stop Time 1432    PT Time Calculation (min) 45 min    Activity Tolerance Patient tolerated treatment well    Behavior During Therapy [facility] for tasks assessed/performed              Past Medical History:  Diagnosis Date   Allergic rhinitis 05/07/2014   Diabetes mellitus without complication ([place])    Dizziness 01/02/2016   GERD (gastroesophageal reflux disease)    Headache 03/13/2015   Left flank pain 01/02/2016   Low back pain 06/06/2015   Pain of left side of body 08/13/2015   Post herpetic neuralgia    Routine general medical examination at a health care facility 05/07/2014   Seizure disorder ([place]) 05/07/2014   Seizures ([place])    Last seizure in the 70s.   Shingles    Stye 05/07/2014   Past Surgical History:  Procedure Laterality Date   COLONOSCOPY     KNEE SURGERY Left    left miniscus   UPPER GASTROINTESTINAL ENDOSCOPY     uvala surgery     Patient Active Problem List   Diagnosis Date Noted   Acute medial meniscus tear of left knee 02/04/2022   Primary osteoarthritis of left knee 02/04/2022   Aortic root dilatation (HCC) 12/18/2021   Slow transit constipation 12/08/2021   Chest wall muscle strain 12/08/2021   Multiple pulmonary nodules 10/06/2021   Hyperlipidemia associated with type 2 diabetes mellitus ([place]) 08/19/2021   [name] 04/07/2021   Complex renal cyst 04/07/2021   Varicose veins of both lower extremities with pain 02/02/2019   Encounter for therapeutic drug level monitoring 09/19/2018   Type 2 diabetes mellitus with diabetic polyneuropathy,  without long-term current use of insulin ([place]) 03/29/2018   Diabetic neuropathy ([place]) 03/29/2018   Chronic pain of left knee 03/23/2018   Coronary artery disease involving native coronary artery of native heart with angina pectoris ([place]) 08/03/2017   Post herpetic neuralgia 07/29/2016   Dizziness 01/02/2016   Chronic midline low back pain without sciatica 06/06/2015   Nonintractable episodic headache 03/13/2015   GERD (gastroesophageal reflux disease) 11/15/2014   Seizure disorder ([place]) 05/07/2014   Allergic rhinitis 05/07/2014    PCP: Flossie Buffy, NP  REFERRING PROVIDER: Flossie Buffy, NP  REFERRING DIAG: M54.50,G89.29 (ICD-10-CM) - Chronic midline low back pain without sciatica M25.562,G89.29 (ICD-10-CM) - Chronic pain of left knee  Rationale for Evaluation and Treatment: Rehabilitation  THERAPY DIAG:  Chronic midline low back pain without sciatica  Muscle weakness (generalized)  Chronic pain of left knee  ONSET DATE: a few weeks ago  SUBJECTIVE:  SUBJECTIVE STATEMENT: He arrives stating he can tolerate dancing but still feels like his pain is around 6/10. He feels he will need a TKA in the future and wants to try to put this off until at least September.  PERTINENT HISTORY:  PMH: diabetes, HTN, CAD, seizures, OA and medial meniscus tear of left knee  PAIN:  Are you having pain? No 0/10 at rest, it hurts when I have to do things 6/10  PRECAUTIONS: None  WEIGHT BEARING RESTRICTIONS: No  FALLS:  Has patient fallen in last 6 months? No  LIVING ENVIRONMENT: Lives in: House/apartment Stairs: 1 flight on stairs, said he goes up and down sideways  OCCUPATION: retired -- enjoys dancing, has an event coming up 3/7   PLOF: Independent  PATIENT GOALS: get back to dancing    NEXT MD VISIT:   OBJECTIVE:   DIAGNOSTIC FINDINGS:  IMPRESSION: (for left knee) 1. Oblique tear of the posterior horn of the medial meniscus extending to the inferior articular surface. 2. High-grade partial-thickness cartilage loss of the medial femorotibial compartment with areas of full-thickness cartilage loss of the weight-bearing surface of the medial femoral condyle.  PATIENT SURVEYS:  Eval: FOTO 46% goal 55%  SCREENING FOR RED FLAGS: Bowel or bladder incontinence: No Cauda equina syndrome: No   COGNITION: Overall cognitive status: Within functional limits for tasks assessed     SENSATION: Light touch: WFL   PALPATION: TTP right gluteal region and greater trochanter, left knee joint line and inferior patella No pain or hypomobility noted in lumbar spine with PAMs   LUMBAR ROM:   AROM eval  Flexion WFL   Extension 75%  Right lateral flexion WFL  Left lateral flexion WFL  Right rotation 50%  Left rotation 50%   (Blank rows = not tested)  LOWER EXTREMITY ROM:     Active  Right eval Left eval  Hip flexion    Hip extension    Hip abduction    Hip adduction    Hip internal rotation    Hip external rotation    Knee flexion 121 106  Knee extension 0 2  Ankle dorsiflexion    Ankle plantarflexion    Ankle inversion    Ankle eversion     (Blank rows = not tested)  LOWER EXTREMITY MMT:    MMT tested in sitting Right eval Left eval  Hip flexion 5 4+  Hip extension 4 4  Hip abduction 5 5  Hip adduction    Hip internal rotation    Hip external rotation    Knee flexion 5 4+  Knee extension 5 4  Ankle dorsiflexion    Ankle plantarflexion    Ankle inversion    Ankle eversion     (Blank rows = not tested)  LUMBAR SPECIAL TESTS:  Eval: Slump test: Negative  FUNCTIONAL TESTS:  Eval: 5 times sit to stand: 16.41sec without UE support, decreased weight shift left and some associated knee pain with this    SLS around 20 sec each leg   TODAY'S  TREATMENT:  08/10/2022: TherEx  Recumbent bike L1 X 10 min Leg press DL 100# 3X10, 50# 2X10 SL each side Step ups on 6 inch step with one UE support X 10 forward and X 10 lateral for left leg Seated knee extension machine eccentrics, up with both and down with left only 10# 2X10 Walking bridges x10  Single knee to chest stretch 20 sec X 3 bilat Lumbar rotation stretch 10 seconds X 5 reps bilat  Pt requested game ready to L knee at end of session today. Medium compression, 10 min.   08/05/2022: TherEx  Nustep L5 x6 minutes BLEs only Bridge +ABD into red TB x20 3 second holds  Sidelying hip ABD x12 B red TB Supine marches with RTB x20  Supine clams with RTB 2x10  Walking bridges x10  LAQ 15x, bilat. VC for slow eccentric control.  Lumbar rotation stretch 5x5 seconds B Modified thomas stretch on L LE 2x30 sec hold. PT assist R SKTC 5x5 seconds Squats at low mat x20 self selected depth  Pt requested game ready to L knee at end of session today. Low compression, 10 min.    PATIENT EDUCATION: Education details: HEP, PT plan of care Person educated: Patient Education method: Explanation, Demonstration, Verbal cues, and Handouts Education comprehension: verbalized understanding and needs further education   HOME EXERCISE PROGRAM: Access Code: [email or web address] URL: [email or web address] Date: 07/14/2022 Prepared by: Elsie Ra  Exercises - Supine Bridge  - 2 x daily - 6 x weekly - 1-2 sets - 10 reps - 5 hold - Supine Straight Leg Raise  - 2 x daily - 6 x weekly - 3 sets - 10 reps - Sit to Stand Without Arm Support  - 2 x daily - 6 x weekly - 1-2 sets - 10 reps - Supine Hamstring Stretch with Strap  - 2 x daily - 6 x weekly - 1 sets - 3 reps - 30 hold - Seated Knee Flexion AAROM  - 2 x daily - 6 x weekly - 1 sets - 10 reps - 5 hold - Hip Extension with Resistance Loop  - 2 x daily - 6 x weekly - 1-2 sets - 10 reps - Standing Hip Abduction with Resistance at Ankles and  Counter Support  - 2 x daily - 6 x weekly - 1-2 sets - 10 reps  ASSESSMENT:  CLINICAL IMPRESSION: Progressed his knee strength program today with good tolerance and without any increased pain from what he already has. PT will continue to focus on strengthening progressions as tolerated to improve standing functional activities.    OBJECTIVE IMPAIRMENTS: decreased activity tolerance, difficulty walking, decreased balance, decreased endurance, decreased mobility, decreased ROM, decreased strength, impaired flexibility, impaired LE use, postural dysfunction, and pain.  ACTIVITY LIMITATIONS: bending, lifting, carry, locomotion, cleaning, community activity, driving, and dancing   PERSONAL FACTORS: PMH: diabetes, HTN, CAD, seizures, OA and medial meniscus tear of left knee are also affecting patient's functional outcome.  REHAB POTENTIAL: Good  CLINICAL DECISION MAKING: Evolving/moderate complexity  EVALUATION COMPLEXITY: Moderate    GOALS: Short term PT Goals Target date: 08/11/2022     Pt will be I and compliant with HEP. Baseline:  Goal status: New Pt will decrease pain by 25% overall Baseline: Goal status: New   Long term PT goals Target date:09/08/2022     Pt will improve lumbar ROM to [facility] to improve functional mobility Baseline: Goal status: New Pt will improve  hip/knee strength to at least 5-/5 MMT to improve functional strength Baseline: Goal status: New Pt will improve FOTO to at least 55% functional to show improved function Baseline: Goal status: New Pt will reduce pain to overall less than 2-3/10 with usual activity and dancing Baseline: Goal status: New Pt will reduce 5 times STS in under 13 sec to show improved function Baseline: Goal status: New  PLAN: PT FREQUENCY: 1-2 times per week   PT DURATION: 4-6 weeks  PLANNED INTERVENTIONS (unless contraindicated): aquatic PT, Canalith  repositioning, cryotherapy, Electrical stimulation, Iontophoresis with 4  mg/ml dexamethasome, Moist heat, traction, Ultrasound, gait training, Therapeutic exercise, balance training, neuromuscular re-education, patient/family education, prosthetic training, manual techniques, passive ROM, dry needling, taping, vasopnuematic device, vestibular, spinal manipulations, joint manipulations  PLAN FOR NEXT SESSION: knee strength progressions as tolerated.  Elsie Ra, PT, DPT 08/10/22 2:24 PM

## 2022-08-13 DIAGNOSIS — E119 Type 2 diabetes mellitus without complications: Secondary | ICD-10-CM | POA: Diagnosis not present

## 2022-08-15 ENCOUNTER — Ambulatory Visit
Admission: RE | Admit: 2022-08-15 | Discharge: 2022-08-15 | Disposition: A | Discharge status: Home / Self Care | Payer: PPO | Source: Ambulatory Visit | Attending: Nurse Practitioner | Admitting: Nurse Practitioner

## 2022-08-15 DIAGNOSIS — K7689 Other specified diseases of liver: Secondary | ICD-10-CM | POA: Diagnosis not present

## 2022-08-15 DIAGNOSIS — N281 Cyst of kidney, acquired: Secondary | ICD-10-CM | POA: Diagnosis not present

## 2022-08-15 DIAGNOSIS — N2889 Other specified disorders of kidney and ureter: Secondary | ICD-10-CM

## 2022-08-15 MED ORDER — GADOPICLENOL 0.5 MMOL/ML IV SOLN
10.0000 mL | Freq: Once | INTRAVENOUS | Status: AC | PRN
  Administered 2022-08-15: 10 mL via INTRAVENOUS

## 2022-08-17 ENCOUNTER — Other Ambulatory Visit: Payer: Self-pay | Admitting: Nurse Practitioner

## 2022-08-17 ENCOUNTER — Encounter: Payer: Self-pay | Admitting: Physical Therapy

## 2022-08-17 ENCOUNTER — Ambulatory Visit: Payer: PPO | Admitting: Physical Therapy

## 2022-08-17 DIAGNOSIS — M79605 Pain in left leg: Secondary | ICD-10-CM

## 2022-08-17 DIAGNOSIS — G8929 Other chronic pain: Secondary | ICD-10-CM | POA: Diagnosis not present

## 2022-08-17 DIAGNOSIS — N281 Cyst of kidney, acquired: Secondary | ICD-10-CM

## 2022-08-17 DIAGNOSIS — M545 Low back pain, unspecified: Secondary | ICD-10-CM

## 2022-08-17 DIAGNOSIS — M6281 Muscle weakness (generalized): Secondary | ICD-10-CM

## 2022-08-17 DIAGNOSIS — M25562 Pain in left knee: Secondary | ICD-10-CM | POA: Diagnosis not present

## 2022-08-17 DIAGNOSIS — M79604 Pain in right leg: Secondary | ICD-10-CM

## 2022-08-17 NOTE — Therapy (Signed)
OUTPATIENT PHYSICAL THERAPY THORACOLUMBAR/KNEE TREATMENT    Patient Name: Nicholas Rasmussen MRN: [id number] DOB:1945/08/10, 77 y.o., male Today's Date: 08/17/2022  END OF SESSION:  PT End of Session - 08/17/22 1152     Visit Number 7    Number of Visits 12    Date for PT Re-Evaluation 08/25/22    Authorization Type [employer]    PT Start Time 1145    PT Stop Time 1230    PT Time Calculation (min) 45 min    Activity Tolerance Patient tolerated treatment well    Behavior During Therapy [facility] for tasks assessed/performed              Past Medical History:  Diagnosis Date   Allergic rhinitis 05/07/2014   Diabetes mellitus without complication ([place])    Dizziness 01/02/2016   GERD (gastroesophageal reflux disease)    Headache 03/13/2015   Left flank pain 01/02/2016   Low back pain 06/06/2015   Pain of left side of body 08/13/2015   Post herpetic neuralgia    Routine general medical examination at a health care facility 05/07/2014   Seizure disorder ([place]) 05/07/2014   Seizures ([place])    Last seizure in the 70s.   Shingles    Stye 05/07/2014   Past Surgical History:  Procedure Laterality Date   COLONOSCOPY     KNEE SURGERY Left    left miniscus   UPPER GASTROINTESTINAL ENDOSCOPY     uvala surgery     Patient Active Problem List   Diagnosis Date Noted   Acute medial meniscus tear of left knee 02/04/2022   Primary osteoarthritis of left knee 02/04/2022   Aortic root dilatation (HCC) 12/18/2021   Slow transit constipation 12/08/2021   Chest wall muscle strain 12/08/2021   Multiple pulmonary nodules 10/06/2021   Hyperlipidemia associated with type 2 diabetes mellitus ([place]) 08/19/2021   [name] 04/07/2021   Complex renal cyst 04/07/2021   Varicose veins of both lower extremities with pain 02/02/2019   Encounter for therapeutic drug level monitoring 09/19/2018   Type 2 diabetes mellitus with diabetic polyneuropathy,  without long-term current use of insulin ([place]) 03/29/2018   Diabetic neuropathy ([place]) 03/29/2018   Chronic pain of left knee 03/23/2018   Coronary artery disease involving native coronary artery of native heart with angina pectoris ([place]) 08/03/2017   Post herpetic neuralgia 07/29/2016   Dizziness 01/02/2016   Chronic midline low back pain without sciatica 06/06/2015   Nonintractable episodic headache 03/13/2015   GERD (gastroesophageal reflux disease) 11/15/2014   Seizure disorder ([place]) 05/07/2014   Allergic rhinitis 05/07/2014    PCP: Flossie Buffy, NP  REFERRING PROVIDER: Flossie Buffy, NP  REFERRING DIAG: M54.50,G89.29 (ICD-10-CM) - Chronic midline low back pain without sciatica M25.562,G89.29 (ICD-10-CM) - Chronic pain of left knee  Rationale for Evaluation and Treatment: Rehabilitation  THERAPY DIAG:  Chronic midline low back pain without sciatica  Muscle weakness (generalized)  Chronic pain of left knee  Pain in right leg  Pain in left leg  ONSET DATE: a few weeks ago  SUBJECTIVE:  SUBJECTIVE STATEMENT: He states his knee pain is about the same, he can dance with pain and does not have pain at times but other times if he turns his knee a certain way he still has the 6/10 pain  PERTINENT HISTORY:  PMH: diabetes, HTN, CAD, seizures, OA and medial meniscus tear of left knee  PAIN:  Are you having pain? No 0/10 at rest, it hurts when I have to do things 6/10  PRECAUTIONS: None  WEIGHT BEARING RESTRICTIONS: No  FALLS:  Has patient fallen in last 6 months? No  LIVING ENVIRONMENT: Lives in: House/apartment Stairs: 1 flight on stairs, said he goes up and down sideways  OCCUPATION: retired -- enjoys dancing, has an event coming up 3/7   PLOF: Independent  PATIENT  GOALS: get back to dancing   NEXT MD VISIT:   OBJECTIVE:   DIAGNOSTIC FINDINGS:  IMPRESSION: (for left knee) 1. Oblique tear of the posterior horn of the medial meniscus extending to the inferior articular surface. 2. High-grade partial-thickness cartilage loss of the medial femorotibial compartment with areas of full-thickness cartilage loss of the weight-bearing surface of the medial femoral condyle.  PATIENT SURVEYS:  Eval: FOTO 46% goal 55%  SCREENING FOR RED FLAGS: Bowel or bladder incontinence: No Cauda equina syndrome: No   COGNITION: Overall cognitive status: Within functional limits for tasks assessed     SENSATION: Light touch: WFL   PALPATION: TTP right gluteal region and greater trochanter, left knee joint line and inferior patella No pain or hypomobility noted in lumbar spine with PAMs   LUMBAR ROM:   AROM eval  Flexion WFL   Extension 75%  Right lateral flexion WFL  Left lateral flexion WFL  Right rotation 50%  Left rotation 50%   (Blank rows = not tested)  LOWER EXTREMITY ROM:     Active  Right eval Left eval Left 08/17/22  Hip flexion     Hip extension     Hip abduction     Hip adduction     Hip internal rotation     Hip external rotation     Knee flexion 121 106 A:120 AAROM: 125  Knee extension 0 2   Ankle dorsiflexion     Ankle plantarflexion     Ankle inversion     Ankle eversion      (Blank rows = not tested)  LOWER EXTREMITY MMT:    MMT tested in sitting Right eval Left eval  Hip flexion 5 4+  Hip extension 4 4  Hip abduction 5 5  Hip adduction    Hip internal rotation    Hip external rotation    Knee flexion 5 4+  Knee extension 5 4  Ankle dorsiflexion    Ankle plantarflexion    Ankle inversion    Ankle eversion     (Blank rows = not tested)  LUMBAR SPECIAL TESTS:  Eval: Slump test: Negative  FUNCTIONAL TESTS:  Eval: 5 times sit to stand: 16.41sec without UE support, decreased weight shift left and some  associated knee pain with this    SLS around 20 sec each leg   TODAY'S TREATMENT:  08/17/2022: TherEx  Recumbent bike L2 X 10 min Leg press DL 106# 3X10, 50# 2X10 SL each side Step ups on 6 inch step with one UE support X 10 forward and X 10 lateral for both legs Seated SLR 3# 2X10 bilat Seated knee extension machine DL 15# 2 X 10, then  eccentrics, up with both and down  with left only 10# 2X10 Supine left heel slides with strap 5 sec X 10 AAROM  -Vasopnuematic device X 10 min, medium compression, 34 deg to Lt knee   08/10/2022: TherEx  Recumbent bike L1 X 10 min Leg press DL 106# 3X10, 50# 2X10 SL each side Step ups on 6 inch step with one UE support X 10 forward and X 10 lateral for left leg Seated knee extension machine eccentrics, up with both and down with left only 10# 2X10 Walking bridges x10  Single knee to chest stretch 20 sec X 3 bilat Lumbar rotation stretch 10 seconds X 5 reps bilat Pt requested game ready to L knee at end of session today. Medium compression, 10 min.   08/05/2022: TherEx  Nustep L5 x6 minutes BLEs only Bridge +ABD into red TB x20 3 second holds  Sidelying hip ABD x12 B red TB Supine marches with RTB x20  Supine clams with RTB 2x10  Walking bridges x10  LAQ 15x, bilat. VC for slow eccentric control.  Lumbar rotation stretch 5x5 seconds B Modified thomas stretch on L LE 2x30 sec hold. PT assist R SKTC 5x5 seconds Squats at low mat x20 self selected depth  Pt requested game ready to L knee at end of session today. Low compression, 10 min.    PATIENT EDUCATION: Education details: HEP, PT plan of care Person educated: Patient Education method: Explanation, Demonstration, Verbal cues, and Handouts Education comprehension: verbalized understanding and needs further education   HOME EXERCISE PROGRAM: Access Code: [email or web address] URL: [email or web address] Date: 07/14/2022 Prepared by: Elsie Ra  Exercises - Supine Bridge  - 2  x daily - 6 x weekly - 1-2 sets - 10 reps - 5 hold - Supine Straight Leg Raise  - 2 x daily - 6 x weekly - 3 sets - 10 reps - Sit to Stand Without Arm Support  - 2 x daily - 6 x weekly - 1-2 sets - 10 reps - Supine Hamstring Stretch with Strap  - 2 x daily - 6 x weekly - 1 sets - 3 reps - 30 hold - Seated Knee Flexion AAROM  - 2 x daily - 6 x weekly - 1 sets - 10 reps - 5 hold - Hip Extension with Resistance Loop  - 2 x daily - 6 x weekly - 1-2 sets - 10 reps - Standing Hip Abduction with Resistance at Ankles and Counter Support  - 2 x daily - 6 x weekly - 1-2 sets - 10 reps  ASSESSMENT:  CLINICAL IMPRESSION: His pain continues to be intermittent but he does feel he is getting something out of his PT workouts. He does still notice his left knee is weaker than Rt and we are making this the focus of his PT sessions. He did show improved left knee flexion ROM today with updated measurement.     OBJECTIVE IMPAIRMENTS: decreased activity tolerance, difficulty walking, decreased balance, decreased endurance, decreased mobility, decreased ROM, decreased strength, impaired flexibility, impaired LE use, postural dysfunction, and pain.  ACTIVITY LIMITATIONS: bending, lifting, carry, locomotion, cleaning, community activity, driving, and dancing   PERSONAL FACTORS: PMH: diabetes, HTN, CAD, seizures, OA and medial meniscus tear of left knee are also affecting patient's functional outcome.  REHAB POTENTIAL: Good  CLINICAL DECISION MAKING: Evolving/moderate complexity  EVALUATION COMPLEXITY: Moderate    GOALS: Short term PT Goals Target date: 08/11/2022     Pt will be I and compliant with HEP. Baseline:  Goal status: New Pt will decrease pain  by 25% overall Baseline: Goal status: New   Long term PT goals Target date:09/08/2022     Pt will improve lumbar ROM to [facility] to improve functional mobility Baseline: Goal status: New Pt will improve  hip/knee strength to at least 5-/5 MMT to improve  functional strength Baseline: Goal status: New Pt will improve FOTO to at least 55% functional to show improved function Baseline: Goal status: New Pt will reduce pain to overall less than 2-3/10 with usual activity and dancing Baseline: Goal status: New Pt will reduce 5 times STS in under 13 sec to show improved function Baseline: Goal status: New  PLAN: PT FREQUENCY: 1-2 times per week   PT DURATION: 4-6 weeks  PLANNED INTERVENTIONS (unless contraindicated): aquatic PT, Canalith repositioning, cryotherapy, Electrical stimulation, Iontophoresis with 4 mg/ml dexamethasome, Moist heat, traction, Ultrasound, gait training, Therapeutic exercise, balance training, neuromuscular re-education, patient/family education, prosthetic training, manual techniques, passive ROM, dry needling, taping, vasopnuematic device, vestibular, spinal manipulations, joint manipulations  PLAN FOR NEXT SESSION: knee strength progressions as tolerated.  Elsie Ra, PT, DPT 08/17/22 11:54 AM

## 2022-08-19 ENCOUNTER — Encounter: Payer: Self-pay | Admitting: Physical Therapy

## 2022-08-19 ENCOUNTER — Ambulatory Visit: Payer: PPO | Admitting: Physical Therapy

## 2022-08-19 DIAGNOSIS — M25562 Pain in left knee: Secondary | ICD-10-CM | POA: Diagnosis not present

## 2022-08-19 DIAGNOSIS — M79604 Pain in right leg: Secondary | ICD-10-CM | POA: Diagnosis not present

## 2022-08-19 DIAGNOSIS — M545 Low back pain, unspecified: Secondary | ICD-10-CM | POA: Diagnosis not present

## 2022-08-19 DIAGNOSIS — G8929 Other chronic pain: Secondary | ICD-10-CM

## 2022-08-19 DIAGNOSIS — M79605 Pain in left leg: Secondary | ICD-10-CM

## 2022-08-19 NOTE — Therapy (Signed)
OUTPATIENT PHYSICAL THERAPY THORACOLUMBAR/KNEE TREATMENT    Patient Name: Nicholas Rasmussen MRN: [id number] DOB:Aug 18, 1945, 77 y.o., male Today's Date: 08/19/2022  END OF SESSION:  PT End of Session - 08/19/22 1153     Visit Number 8    Number of Visits 12    Date for PT Re-Evaluation 08/25/22    Authorization Type [employer]    PT Start Time 1145    PT Stop Time 1230    PT Time Calculation (min) 45 min    Activity Tolerance Patient tolerated treatment well    Behavior During Therapy [facility] for tasks assessed/performed              Past Medical History:  Diagnosis Date   Allergic rhinitis 05/07/2014   Diabetes mellitus without complication ([place])    Dizziness 01/02/2016   GERD (gastroesophageal reflux disease)    Headache 03/13/2015   Left flank pain 01/02/2016   Low back pain 06/06/2015   Pain of left side of body 08/13/2015   Post herpetic neuralgia    Routine general medical examination at a health care facility 05/07/2014   Seizure disorder ([place]) 05/07/2014   Seizures ([place])    Last seizure in the 70s.   Shingles    Stye 05/07/2014   Past Surgical History:  Procedure Laterality Date   COLONOSCOPY     KNEE SURGERY Left    left miniscus   UPPER GASTROINTESTINAL ENDOSCOPY     uvala surgery     Patient Active Problem List   Diagnosis Date Noted   Acute medial meniscus tear of left knee 02/04/2022   Primary osteoarthritis of left knee 02/04/2022   Aortic root dilatation (HCC) 12/18/2021   Slow transit constipation 12/08/2021   Chest wall muscle strain 12/08/2021   Multiple pulmonary nodules 10/06/2021   Hyperlipidemia associated with type 2 diabetes mellitus ([place]) 08/19/2021   [name] 04/07/2021   Complex renal cyst 04/07/2021   Varicose veins of both lower extremities with pain 02/02/2019   Encounter for therapeutic drug level monitoring 09/19/2018   Type 2 diabetes mellitus with diabetic polyneuropathy,  without long-term current use of insulin ([place]) 03/29/2018   Diabetic neuropathy ([place]) 03/29/2018   Chronic pain of left knee 03/23/2018   Coronary artery disease involving native coronary artery of native heart with angina pectoris ([place]) 08/03/2017   Post herpetic neuralgia 07/29/2016   Dizziness 01/02/2016   Chronic midline low back pain without sciatica 06/06/2015   Nonintractable episodic headache 03/13/2015   GERD (gastroesophageal reflux disease) 11/15/2014   Seizure disorder ([place]) 05/07/2014   Allergic rhinitis 05/07/2014    PCP: Flossie Buffy, NP  REFERRING PROVIDER: Flossie Buffy, NP  REFERRING DIAG: M54.50,G89.29 (ICD-10-CM) - Chronic midline low back pain without sciatica M25.562,G89.29 (ICD-10-CM) - Chronic pain of left knee  Rationale for Evaluation and Treatment: Rehabilitation  THERAPY DIAG:  Chronic midline low back pain without sciatica  Chronic pain of left knee  Pain in right leg  Pain in left leg  ONSET DATE: a few weeks ago  SUBJECTIVE:  SUBJECTIVE STATEMENT: He states his knee pain is maybe 5/10 with activity, he feels the workouts have been going good.  PERTINENT HISTORY:  PMH: diabetes, HTN, CAD, seizures, OA and medial meniscus tear of left knee  PAIN:  Are you having pain? No 0/10 at rest, it hurts when I have to do things 5/10  PRECAUTIONS: None  WEIGHT BEARING RESTRICTIONS: No  FALLS:  Has patient fallen in last 6 months? No  LIVING ENVIRONMENT: Lives in: House/apartment Stairs: 1 flight on stairs, said he goes up and down sideways  OCCUPATION: retired -- enjoys dancing, has an event coming up 3/7   PLOF: Independent  PATIENT GOALS: get back to dancing   NEXT MD VISIT:   OBJECTIVE:   DIAGNOSTIC FINDINGS:  IMPRESSION: (for left  knee) 1. Oblique tear of the posterior horn of the medial meniscus extending to the inferior articular surface. 2. High-grade partial-thickness cartilage loss of the medial femorotibial compartment with areas of full-thickness cartilage loss of the weight-bearing surface of the medial femoral condyle.  PATIENT SURVEYS:  Eval: FOTO 46% goal 55%  SCREENING FOR RED FLAGS: Bowel or bladder incontinence: No Cauda equina syndrome: No   COGNITION: Overall cognitive status: Within functional limits for tasks assessed     SENSATION: Light touch: WFL   PALPATION: TTP right gluteal region and greater trochanter, left knee joint line and inferior patella No pain or hypomobility noted in lumbar spine with PAMs   LUMBAR ROM:   AROM eval  Flexion WFL   Extension 75%  Right lateral flexion WFL  Left lateral flexion WFL  Right rotation 50%  Left rotation 50%   (Blank rows = not tested)  LOWER EXTREMITY ROM:     Active  Right eval Left eval Left 08/17/22  Hip flexion     Hip extension     Hip abduction     Hip adduction     Hip internal rotation     Hip external rotation     Knee flexion 121 106 A:120 AAROM: 125  Knee extension 0 2   Ankle dorsiflexion     Ankle plantarflexion     Ankle inversion     Ankle eversion      (Blank rows = not tested)  LOWER EXTREMITY MMT:    MMT tested in sitting Right eval Left eval  Hip flexion 5 4+  Hip extension 4 4  Hip abduction 5 5  Hip adduction    Hip internal rotation    Hip external rotation    Knee flexion 5 4+  Knee extension 5 4  Ankle dorsiflexion    Ankle plantarflexion    Ankle inversion    Ankle eversion     (Blank rows = not tested)  LUMBAR SPECIAL TESTS:  Eval: Slump test: Negative  FUNCTIONAL TESTS:  Eval: 5 times sit to stand: 16.41sec without UE support, decreased weight shift left and some associated knee pain with this    SLS around 20 sec each leg   TODAY'S TREATMENT:   08/19/22 TherEx  Recumbent bike L3 X 10 min Leg press DL (62# warm up set since bikes were taken when he first arrived)106# 3X10, 50# 2X10 SL each side Step ups on 6 inch step with one UE support X 10 forward and X 10 lateral for both legs Seated SLR 3# 2X10 bilat Seated LAQ (knee extension machine occupied) 3# 2X10 bilat  -Vasopnuematic device X 10 min, medium compression, 34 deg to Lt knee  08/17/2022: TherEx  Recumbent bike L2 X 10 min Leg press DL (62# warm up set since bikes were taken when he first arrived)106# 3X10, 50# 2X10 SL each side Step ups on 6 inch step with one UE support X 10 forward and X 10 lateral for both legs Seated SLR 3# 2X10 bilat Seated knee extension machine DL 15# 2 X 10, then  eccentrics, up with both and down with left only 10# 2X10 Supine left heel slides with strap 5 sec X 10 AAROM  -Vasopnuematic device X 10 min, medium compression, 34 deg to Lt knee   08/10/2022: TherEx  Recumbent bike L1 X 10 min Leg press DL 106# 3X10, 50# 2X10 SL each side Step ups on 6 inch step with one UE support X 10 forward and X 10 lateral for left leg Seated knee extension machine eccentrics, up with both and down with left only 10# 2X10 Walking bridges x10  Single knee to chest stretch 20 sec X 3 bilat Lumbar rotation stretch 10 seconds X 5 reps bilat Pt requested game ready to L knee at end of session today. Medium compression, 10 min.   08/05/2022: TherEx  Nustep L5 x6 minutes BLEs only Bridge +ABD into red TB x20 3 second holds  Sidelying hip ABD x12 B red TB Supine marches with RTB x20  Supine clams with RTB 2x10  Walking bridges x10  LAQ 15x, bilat. VC for slow eccentric control.  Lumbar rotation stretch 5x5 seconds B Modified thomas stretch on L LE 2x30 sec hold. PT assist R SKTC 5x5 seconds Squats at low mat x20 self selected depth  Pt requested game ready to L knee at end of session today. Low compression, 10 min.    PATIENT  EDUCATION: Education details: HEP, PT plan of care Person educated: Patient Education method: Explanation, Demonstration, Verbal cues, and Handouts Education comprehension: verbalized understanding and needs further education   HOME EXERCISE PROGRAM: Access Code: [email or web address] URL: [email or web address] Date: 07/14/2022 Prepared by: Elsie Ra  Exercises - Supine Bridge  - 2 x daily - 6 x weekly - 1-2 sets - 10 reps - 5 hold - Supine Straight Leg Raise  - 2 x daily - 6 x weekly - 3 sets - 10 reps - Sit to Stand Without Arm Support  - 2 x daily - 6 x weekly - 1-2 sets - 10 reps - Supine Hamstring Stretch with Strap  - 2 x daily - 6 x weekly - 1 sets - 3 reps - 30 hold - Seated Knee Flexion AAROM  - 2 x daily - 6 x weekly - 1 sets - 10 reps - 5 hold - Hip Extension with Resistance Loop  - 2 x daily - 6 x weekly - 1-2 sets - 10 reps - Standing Hip Abduction with Resistance at Ankles and Counter Support  - 2 x daily - 6 x weekly - 1-2 sets - 10 reps  ASSESSMENT:  CLINICAL IMPRESSION: He showed good tolerance to exercise progression today without complaints.  He has one more week scheduled as of now and we will reassess for his PT plan of care next week.   OBJECTIVE IMPAIRMENTS: decreased activity tolerance, difficulty walking, decreased balance, decreased endurance, decreased mobility, decreased ROM, decreased strength, impaired flexibility, impaired LE use, postural dysfunction, and pain.  ACTIVITY LIMITATIONS: bending, lifting, carry, locomotion, cleaning, community activity, driving, and dancing   PERSONAL FACTORS: PMH: diabetes, HTN, CAD, seizures, OA and medial meniscus tear of left knee are also affecting patient's functional outcome.  REHAB POTENTIAL: Good  CLINICAL DECISION MAKING: Evolving/moderate complexity  EVALUATION COMPLEXITY: Moderate    GOALS: Short term PT Goals Target date: 08/11/2022     Pt will be I and compliant with HEP. Baseline:  Goal  status: New Pt will decrease pain by 25% overall Baseline: Goal status: New   Long term PT goals Target date:09/08/2022     Pt will improve lumbar ROM to [facility] to improve functional mobility Baseline: Goal status: New Pt will improve  hip/knee strength to at least 5-/5 MMT to improve functional strength Baseline: Goal status: New Pt will improve FOTO to at least 55% functional to show improved function Baseline: Goal status: New Pt will reduce pain to overall less than 2-3/10 with usual activity and dancing Baseline: Goal status: New Pt will reduce 5 times STS in under 13 sec to show improved function Baseline: Goal status: New  PLAN: PT FREQUENCY: 1-2 times per week   PT DURATION: 4-6 weeks  PLANNED INTERVENTIONS (unless contraindicated): aquatic PT, Canalith repositioning, cryotherapy, Electrical stimulation, Iontophoresis with 4 mg/ml dexamethasome, Moist heat, traction, Ultrasound, gait training, Therapeutic exercise, balance training, neuromuscular re-education, patient/family education, prosthetic training, manual techniques, passive ROM, dry needling, taping, vasopnuematic device, vestibular, spinal manipulations, joint manipulations  PLAN FOR NEXT SESSION: knee strength progressions as tolerated., reassess next week.  Elsie Ra, PT, DPT 08/19/22 11:54 AM

## 2022-08-24 ENCOUNTER — Ambulatory Visit: Payer: PPO | Admitting: Physical Therapy

## 2022-08-24 ENCOUNTER — Encounter: Payer: Self-pay | Admitting: Physical Therapy

## 2022-08-24 DIAGNOSIS — M25562 Pain in left knee: Secondary | ICD-10-CM

## 2022-08-24 DIAGNOSIS — M545 Low back pain, unspecified: Secondary | ICD-10-CM

## 2022-08-24 DIAGNOSIS — M79604 Pain in right leg: Secondary | ICD-10-CM

## 2022-08-24 DIAGNOSIS — M79605 Pain in left leg: Secondary | ICD-10-CM | POA: Diagnosis not present

## 2022-08-24 DIAGNOSIS — G8929 Other chronic pain: Secondary | ICD-10-CM | POA: Diagnosis not present

## 2022-08-24 DIAGNOSIS — M6281 Muscle weakness (generalized): Secondary | ICD-10-CM | POA: Diagnosis not present

## 2022-08-24 NOTE — Therapy (Signed)
OUTPATIENT PHYSICAL THERAPY THORACOLUMBAR/KNEE TREATMENT    Patient Name: Nicholas Rasmussen MRN: [id number] DOB:May 21, 1946, 77 y.o., male Today's Date: 08/24/2022  END OF SESSION:  PT End of Session - 08/24/22 1158     Visit Number 9    Number of Visits 12    Date for PT Re-Evaluation 08/25/22    Authorization Type [employer]    PT Start Time 1145    PT Stop Time 1230    PT Time Calculation (min) 45 min    Activity Tolerance Patient tolerated treatment well    Behavior During Therapy [facility] for tasks assessed/performed              Past Medical History:  Diagnosis Date   Allergic rhinitis 05/07/2014   Diabetes mellitus without complication    Dizziness 01/02/2016   GERD (gastroesophageal reflux disease)    Headache 03/13/2015   Left flank pain 01/02/2016   Low back pain 06/06/2015   Pain of left side of body 08/13/2015   Post herpetic neuralgia    Routine general medical examination at a health care facility 05/07/2014   Seizure disorder 05/07/2014   Seizures    Last seizure in the 70s.   Shingles    Stye 05/07/2014   Past Surgical History:  Procedure Laterality Date   COLONOSCOPY     KNEE SURGERY Left    left miniscus   UPPER GASTROINTESTINAL ENDOSCOPY     uvala surgery     Patient Active Problem List   Diagnosis Date Noted   Acute medial meniscus tear of left knee 02/04/2022   Primary osteoarthritis of left knee 02/04/2022   Aortic root dilatation 12/18/2021   Slow transit constipation 12/08/2021   Chest wall muscle strain 12/08/2021   Multiple pulmonary nodules 10/06/2021   Hyperlipidemia associated with type 2 diabetes mellitus 08/19/2021   [name] 04/07/2021   Complex renal cyst 04/07/2021   Varicose veins of both lower extremities with pain 02/02/2019   Encounter for therapeutic drug level monitoring 09/19/2018   Type 2 diabetes mellitus with diabetic polyneuropathy, without long-term current use of  insulin 03/29/2018   Diabetic neuropathy 03/29/2018   Chronic pain of left knee 03/23/2018   Coronary artery disease involving native coronary artery of native heart with angina pectoris 08/03/2017   Post herpetic neuralgia 07/29/2016   Dizziness 01/02/2016   Chronic midline low back pain without sciatica 06/06/2015   Nonintractable episodic headache 03/13/2015   GERD (gastroesophageal reflux disease) 11/15/2014   Seizure disorder 05/07/2014   Allergic rhinitis 05/07/2014    PCP: Flossie Buffy, NP  REFERRING PROVIDER: Flossie Buffy, NP  REFERRING DIAG: M54.50,G89.29 (ICD-10-CM) - Chronic midline low back pain without sciatica M25.562,G89.29 (ICD-10-CM) - Chronic pain of left knee  Rationale for Evaluation and Treatment: Rehabilitation  THERAPY DIAG:  Chronic midline low back pain without sciatica  Chronic pain of left knee  Pain in right leg  Pain in left leg  Muscle weakness (generalized)  ONSET DATE: a few weeks ago  SUBJECTIVE:  SUBJECTIVE STATEMENT: He states he has some strange pain at his left knee cap. He was been trying out different knee sleeves and braces.   PERTINENT HISTORY:  PMH: diabetes, HTN, CAD, seizures, OA and medial meniscus tear of left knee  PAIN:  Are you having pain? No 0/10 at rest, it hurts when I have to do things 5/10  PRECAUTIONS: None  WEIGHT BEARING RESTRICTIONS: No  FALLS:  Has patient fallen in last 6 months? No  LIVING ENVIRONMENT: Lives in: House/apartment Stairs: 1 flight on stairs, said he goes up and down sideways  OCCUPATION: retired -- enjoys dancing, has an event coming up 3/7   PLOF: Independent  PATIENT GOALS: get back to dancing   NEXT MD VISIT:   OBJECTIVE:   DIAGNOSTIC FINDINGS:  IMPRESSION: (for left knee) 1.  Oblique tear of the posterior horn of the medial meniscus extending to the inferior articular surface. 2. High-grade partial-thickness cartilage loss of the medial femorotibial compartment with areas of full-thickness cartilage loss of the weight-bearing surface of the medial femoral condyle.  PATIENT SURVEYS:  Eval: FOTO 46% goal 55%  SCREENING FOR RED FLAGS: Bowel or bladder incontinence: No Cauda equina syndrome: No   COGNITION: Overall cognitive status: Within functional limits for tasks assessed     SENSATION: Light touch: WFL   PALPATION: TTP right gluteal region and greater trochanter, left knee joint line and inferior patella No pain or hypomobility noted in lumbar spine with PAMs   LUMBAR ROM:   AROM eval  Flexion WFL   Extension 75%  Right lateral flexion WFL  Left lateral flexion WFL  Right rotation 50%  Left rotation 50%   (Blank rows = not tested)  LOWER EXTREMITY ROM:     Active  Right eval Left eval Left 08/17/22  Hip flexion     Hip extension     Hip abduction     Hip adduction     Hip internal rotation     Hip external rotation     Knee flexion 121 106 A:120 AAROM: 125  Knee extension 0 2   Ankle dorsiflexion     Ankle plantarflexion     Ankle inversion     Ankle eversion      (Blank rows = not tested)  LOWER EXTREMITY MMT:    MMT tested in sitting Right eval Left eval  Hip flexion 5 4+  Hip extension 4 4  Hip abduction 5 5  Hip adduction    Hip internal rotation    Hip external rotation    Knee flexion 5 4+  Knee extension 5 4  Ankle dorsiflexion    Ankle plantarflexion    Ankle inversion    Ankle eversion     (Blank rows = not tested)  LUMBAR SPECIAL TESTS:  Eval: Slump test: Negative  FUNCTIONAL TESTS:  Eval: 5 times sit to stand: 16.41sec without UE support, decreased weight shift left and some associated knee pain with this    SLS around 20 sec each leg   TODAY'S TREATMENT:  08/24/22 TherEx  Recumbent bike L3 X  10 min Leg press DL 106# 3X10, 50# 2X10 SL each side Seated Pball lumbar flexion roll outs 5 sec hold  X5 reps fwd and diagonals Wall 1/4 squats with red ball behind back, 2X10 Step ups on 6 inch step with one UE support X 10 forward and X 10 lateral for both legs Seated SLR 3# 2X10 bilat   -Vasopnuematic device X 10 min, medium  compression, 34 deg to Lt knee  08/19/22 TherEx  Recumbent bike L3 X 10 min Leg press DL (62# warm up set since bikes were taken when he first arrived)106# 3X10, 50# 2X10 SL each side Step ups on 6 inch step with one UE support X 10 forward and X 10 lateral for both legs Seated SLR 3# 2X10 bilat Seated LAQ (knee extension machine occupied) 3# 2X10 bilat  -Vasopnuematic device X 10 min, medium compression, 34 deg to Lt knee  08/17/2022: TherEx  Recumbent bike L2 X 10 min Leg press DL (62# warm up set since bikes were taken when he first arrived)106# 3X10, 50# 2X10 SL each side Step ups on 6 inch step with one UE support X 10 forward and X 10 lateral for both legs Seated SLR 3# 2X10 bilat Seated knee extension machine DL 15# 2 X 10, then  eccentrics, up with both and down with left only 10# 2X10 Supine left heel slides with strap 5 sec X 10 AAROM  -Vasopnuematic device X 10 min, medium compression, 34 deg to Lt knee    PATIENT EDUCATION: Education details: HEP, PT plan of care Person educated: Patient Education method: Explanation, Demonstration, Verbal cues, and Handouts Education comprehension: verbalized understanding and needs further education   HOME EXERCISE PROGRAM: Access Code: [email or web address] URL: [email or web address] Date: 07/14/2022 Prepared by: Elsie Ra  Exercises - Supine Bridge  - 2 x daily - 6 x weekly - 1-2 sets - 10 reps - 5 hold - Supine Straight Leg Raise  - 2 x daily - 6 x weekly - 3 sets - 10 reps - Sit to Stand Without Arm Support  - 2 x daily - 6 x weekly - 1-2 sets - 10 reps - Supine Hamstring Stretch with  Strap  - 2 x daily - 6 x weekly - 1 sets - 3 reps - 30 hold - Seated Knee Flexion AAROM  - 2 x daily - 6 x weekly - 1 sets - 10 reps - 5 hold - Hip Extension with Resistance Loop  - 2 x daily - 6 x weekly - 1-2 sets - 10 reps - Standing Hip Abduction with Resistance at Ankles and Counter Support  - 2 x daily - 6 x weekly - 1-2 sets - 10 reps  ASSESSMENT:  CLINICAL IMPRESSION: He has one more visit scheduled as of now and we will reassess for his PT plan of care next visit. He had good overall tolerance to strength progressions today.   OBJECTIVE IMPAIRMENTS: decreased activity tolerance, difficulty walking, decreased balance, decreased endurance, decreased mobility, decreased ROM, decreased strength, impaired flexibility, impaired LE use, postural dysfunction, and pain.  ACTIVITY LIMITATIONS: bending, lifting, carry, locomotion, cleaning, community activity, driving, and dancing   PERSONAL FACTORS: PMH: diabetes, HTN, CAD, seizures, OA and medial meniscus tear of left knee are also affecting patient's functional outcome.  REHAB POTENTIAL: Good  CLINICAL DECISION MAKING: Evolving/moderate complexity  EVALUATION COMPLEXITY: Moderate    GOALS: Short term PT Goals Target date: 08/11/2022     Pt will be I and compliant with HEP. Baseline:  Goal status: New Pt will decrease pain by 25% overall Baseline: Goal status: New   Long term PT goals Target date:09/08/2022     Pt will improve lumbar ROM to [facility] to improve functional mobility Baseline: Goal status: New Pt will improve  hip/knee strength to at least 5-/5 MMT to improve functional strength Baseline: Goal status: New Pt will improve FOTO to at least 55% functional  to show improved function Baseline: Goal status: New Pt will reduce pain to overall less than 2-3/10 with usual activity and dancing Baseline: Goal status: New Pt will reduce 5 times STS in under 13 sec to show improved function Baseline: Goal status:  New  PLAN: PT FREQUENCY: 1-2 times per week   PT DURATION: 4-6 weeks  PLANNED INTERVENTIONS (unless contraindicated): aquatic PT, Canalith repositioning, cryotherapy, Electrical stimulation, Iontophoresis with 4 mg/ml dexamethasome, Moist heat, traction, Ultrasound, gait training, Therapeutic exercise, balance training, neuromuscular re-education, patient/family education, prosthetic training, manual techniques, passive ROM, dry needling, taping, vasopnuematic device, vestibular, spinal manipulations, joint manipulations  PLAN FOR NEXT SESSION: progress note and reassessment Elsie Ra, PT, DPT 08/24/22 12:21 PM

## 2022-08-26 ENCOUNTER — Ambulatory Visit: Payer: PPO | Admitting: Physical Therapy

## 2022-08-26 ENCOUNTER — Encounter: Payer: Self-pay | Admitting: Physical Therapy

## 2022-08-26 DIAGNOSIS — M545 Low back pain, unspecified: Secondary | ICD-10-CM | POA: Diagnosis not present

## 2022-08-26 DIAGNOSIS — M6281 Muscle weakness (generalized): Secondary | ICD-10-CM

## 2022-08-26 DIAGNOSIS — M79605 Pain in left leg: Secondary | ICD-10-CM | POA: Diagnosis not present

## 2022-08-26 DIAGNOSIS — M25562 Pain in left knee: Secondary | ICD-10-CM

## 2022-08-26 DIAGNOSIS — G8929 Other chronic pain: Secondary | ICD-10-CM | POA: Diagnosis not present

## 2022-08-26 DIAGNOSIS — M79604 Pain in right leg: Secondary | ICD-10-CM

## 2022-08-26 NOTE — Therapy (Signed)
OUTPATIENT PHYSICAL THERAPY TREATMENT Progress Note reporting period date 07/14/22 to 08/26/22  See below for objective and subjective measurements relating to patients progress with PT.    Patient Name: Nicholas Rasmussen MRN: [id number] DOB:Jan 28, 1946, 77 y.o., male Today's Date: 08/26/2022  END OF SESSION:  PT End of Session - 08/26/22 1520     Visit Number 10    Number of Visits 12    Date for PT Re-Evaluation 08/25/22    Authorization Type [employer]    PT Start Time 1515    PT Stop Time 1555    PT Time Calculation (min) 40 min    Activity Tolerance Patient tolerated treatment well    Behavior During Therapy [facility] for tasks assessed/performed              Past Medical History:  Diagnosis Date   Allergic rhinitis 05/07/2014   Diabetes mellitus without complication    Dizziness 01/02/2016   GERD (gastroesophageal reflux disease)    Headache 03/13/2015   Left flank pain 01/02/2016   Low back pain 06/06/2015   Pain of left side of body 08/13/2015   Post herpetic neuralgia    Routine general medical examination at a health care facility 05/07/2014   Seizure disorder 05/07/2014   Seizures    Last seizure in the 70s.   Shingles    Stye 05/07/2014   Past Surgical History:  Procedure Laterality Date   COLONOSCOPY     KNEE SURGERY Left    left miniscus   UPPER GASTROINTESTINAL ENDOSCOPY     uvala surgery     Patient Active Problem List   Diagnosis Date Noted   Acute medial meniscus tear of left knee 02/04/2022   Primary osteoarthritis of left knee 02/04/2022   Aortic root dilatation 12/18/2021   Slow transit constipation 12/08/2021   Chest wall muscle strain 12/08/2021   Multiple pulmonary nodules 10/06/2021   Hyperlipidemia associated with type 2 diabetes mellitus 08/19/2021   [name] 04/07/2021   Complex renal cyst 04/07/2021   Varicose veins of both lower extremities with pain 02/02/2019   Encounter for  therapeutic drug level monitoring 09/19/2018   Type 2 diabetes mellitus with diabetic polyneuropathy, without long-term current use of insulin 03/29/2018   Diabetic neuropathy 03/29/2018   Chronic pain of left knee 03/23/2018   Coronary artery disease involving native coronary artery of native heart with angina pectoris 08/03/2017   Post herpetic neuralgia 07/29/2016   Dizziness 01/02/2016   Chronic midline low back pain without sciatica 06/06/2015   Nonintractable episodic headache 03/13/2015   GERD (gastroesophageal reflux disease) 11/15/2014   Seizure disorder 05/07/2014   Allergic rhinitis 05/07/2014    PCP: Flossie Buffy, NP  REFERRING PROVIDER: Flossie Buffy, NP  REFERRING DIAG: M54.50,G89.29 (ICD-10-CM) - Chronic midline low back pain without sciatica M25.562,G89.29 (ICD-10-CM) - Chronic pain of left knee  Rationale for Evaluation and Treatment: Rehabilitation  THERAPY DIAG:  Chronic midline low back pain without sciatica  Chronic pain of left knee  Pain in right leg  Pain in left leg  Muscle weakness (generalized)  ONSET DATE: a few weeks ago  SUBJECTIVE:  SUBJECTIVE STATEMENT: He states his knee is doing okay, he wants to trial independent program and will return to PT if needed PERTINENT HISTORY:  PMH: diabetes, HTN, CAD, seizures, OA and medial meniscus tear of left knee  PAIN:  Are you having pain? No 0/10 at rest, it hurts when I have to do things 5/10  PRECAUTIONS: None  WEIGHT BEARING RESTRICTIONS: No  FALLS:  Has patient fallen in last 6 months? No  LIVING ENVIRONMENT: Lives in: House/apartment Stairs: 1 flight on stairs, said he goes up and down sideways  OCCUPATION: retired -- enjoys dancing, has an event coming up 3/7   PLOF:  Independent  PATIENT GOALS: get back to dancing   NEXT MD VISIT:   OBJECTIVE:   DIAGNOSTIC FINDINGS:  IMPRESSION: (for left knee) 1. Oblique tear of the posterior horn of the medial meniscus extending to the inferior articular surface. 2. High-grade partial-thickness cartilage loss of the medial femorotibial compartment with areas of full-thickness cartilage loss of the weight-bearing surface of the medial femoral condyle.  PATIENT SURVEYS:  Eval: FOTO 46% goal 55% 08/25/21: FOTO 73%  SCREENING FOR RED FLAGS: Bowel or bladder incontinence: No Cauda equina syndrome: No   COGNITION: Overall cognitive status: Within functional limits for tasks assessed     SENSATION: Light touch: WFL   PALPATION: TTP right gluteal region and greater trochanter, left knee joint line and inferior patella No pain or hypomobility noted in lumbar spine with PAMs   LUMBAR ROM:   AROM eval 08/26/22  Flexion [facility]  WFL  Extension 75% WFL  Right lateral flexion [facility] WFL  Left lateral flexion [facility] WFL  Right rotation 50% WFL  Left rotation 50% WFL   (Blank rows = not tested)  LOWER EXTREMITY ROM:     Active  Right eval Left eval Left 08/17/22  Hip flexion     Hip extension     Hip abduction     Hip adduction     Hip internal rotation     Hip external rotation     Knee flexion 121 106 A:120 AAROM: 125  Knee extension 0 2   Ankle dorsiflexion     Ankle plantarflexion     Ankle inversion     Ankle eversion      (Blank rows = not tested)  LOWER EXTREMITY MMT:    MMT tested in sitting Right eval Left eval Rt/Lt 08/26/22  Hip flexion 5 4+   Hip extension 4 4   Hip abduction 5 5   Hip adduction     Hip internal rotation     Hip external rotation     Knee flexion 5 4+   Knee extension 5 4   Ankle dorsiflexion     Ankle plantarflexion     Ankle inversion     Ankle eversion      (Blank rows = not tested)  LUMBAR SPECIAL TESTS:  Eval: Slump test: Negative  FUNCTIONAL TESTS:   Eval: 5 times sit to stand: 16.41sec without UE support, decreased weight shift left and some associated knee pain with this   SLS around 20 sec each leg    08/26/22: 5 times sit to stand 17 seconds without UE support     TODAY'S TREATMENT:  08/26/22 TherEx  Recumbent bike L3 X 10 min Updated measurements and goals Leg press DL 106# 3X10, 50# 2X10 SL each side Seated Pball lumbar flexion roll outs 5 sec hold  X5 reps fwd and diagonals Wall 1/4 squats  with red ball behind back, 2X10 Step ups on 6 inch step with one UE support X 10 forward and X 10 lateral for both legs Seated SLR 3# 2X10 bilat   08/24/22 TherEx  Recumbent bike L3 X 10 min Leg press DL 106# 3X10, 50# 2X10 SL each side Seated Pball lumbar flexion roll outs 5 sec hold  X5 reps fwd and diagonals Wall 1/4 squats with red ball behind back, 2X10 Step ups on 6 inch step with one UE support X 10 forward and X 10 lateral for both legs Seated SLR 3# 2X10 bilat   -Vasopnuematic device X 10 min, medium compression, 34 deg to Lt knee  08/19/22 TherEx  Recumbent bike L3 X 10 min Leg press DL (62# warm up set since bikes were taken when he first arrived)106# 3X10, 50# 2X10 SL each side Step ups on 6 inch step with one UE support X 10 forward and X 10 lateral for both legs Seated SLR 3# 2X10 bilat Seated LAQ (knee extension machine occupied) 3# 2X10 bilat  -Vasopnuematic device X 10 min, medium compression, 34 deg to Lt knee  08/17/2022: TherEx  Recumbent bike L2 X 10 min Leg press DL (62# warm up set since bikes were taken when he first arrived)106# 3X10, 50# 2X10 SL each side Step ups on 6 inch step with one UE support X 10 forward and X 10 lateral for both legs Seated SLR 3# 2X10 bilat Seated knee extension machine DL 15# 2 X 10, then  eccentrics, up with both and down with left only 10# 2X10 Supine left heel slides with strap 5 sec X 10 AAROM  -Vasopnuematic device X 10 min, medium compression, 34 deg to Lt  knee    PATIENT EDUCATION: Education details: HEP, PT plan of care Person educated: Patient Education method: Explanation, Demonstration, Verbal cues, and Handouts Education comprehension: verbalized understanding and needs further education   HOME EXERCISE PROGRAM: Access Code: [email or web address] URL: [email or web address] Date: 07/14/2022 Prepared by: Elsie Ra  Exercises - Supine Bridge  - 2 x daily - 6 x weekly - 1-2 sets - 10 reps - 5 hold - Supine Straight Leg Raise  - 2 x daily - 6 x weekly - 3 sets - 10 reps - Sit to Stand Without Arm Support  - 2 x daily - 6 x weekly - 1-2 sets - 10 reps - Supine Hamstring Stretch with Strap  - 2 x daily - 6 x weekly - 1 sets - 3 reps - 30 hold - Seated Knee Flexion AAROM  - 2 x daily - 6 x weekly - 1 sets - 10 reps - 5 hold - Hip Extension with Resistance Loop  - 2 x daily - 6 x weekly - 1-2 sets - 10 reps - Standing Hip Abduction with Resistance at Ankles and Counter Support  - 2 x daily - 6 x weekly - 1-2 sets - 10 reps  ASSESSMENT:  CLINICAL IMPRESSION: He has done well with PT, has met all but one PT goal. He will trial independent program and we will hold his case open 30 days and if no return then will close out.   OBJECTIVE IMPAIRMENTS: decreased activity tolerance, difficulty walking, decreased balance, decreased endurance, decreased mobility, decreased ROM, decreased strength, impaired flexibility, impaired LE use, postural dysfunction, and pain.  ACTIVITY LIMITATIONS: bending, lifting, carry, locomotion, cleaning, community activity, driving, and dancing   PERSONAL FACTORS: PMH: diabetes, HTN, CAD, seizures, OA and medial meniscus tear of left knee  are also affecting patient's functional outcome.  REHAB POTENTIAL: Good  CLINICAL DECISION MAKING: Evolving/moderate complexity  EVALUATION COMPLEXITY: Moderate    GOALS: Short term PT Goals Target date: 08/11/2022     Pt will be I and compliant with HEP. Baseline:   Goal status: MET 08/11/22 Pt will decrease pain by 25% overall Baseline: Goal status: MET 08/11/22   Long term PT goals Target date:09/08/2022     Pt will improve lumbar ROM to [facility] to improve functional mobility Baseline: Goal status: MET 08/26/22 Pt will improve  hip/knee strength to at least 5-/5 MMT to improve functional strength Baseline: Goal status: MET 08/26/22 Pt will improve FOTO to at least 55% functional to show improved function Baseline: Goal status: MET 08/26/22 Pt will reduce pain to overall less than 2-3/10 with usual activity and dancing Baseline: Goal status: mostly met Pt will reduce 5 times STS in under 13 sec to show improved function Baseline: Goal status: ongoing, currently takes 16 seconds  PLAN: PT FREQUENCY: 1-2 times per week   PT DURATION: 4-6 weeks  PLANNED INTERVENTIONS (unless contraindicated): aquatic PT, Canalith repositioning, cryotherapy, Electrical stimulation, Iontophoresis with 4 mg/ml dexamethasome, Moist heat, traction, Ultrasound, gait training, Therapeutic exercise, balance training, neuromuscular re-education, patient/family education, prosthetic training, manual techniques, passive ROM, dry needling, taping, vasopnuematic device, vestibular, spinal manipulations, joint manipulations  PLAN FOR NEXT SESSION: Elsie Ra, PT, DPT 08/26/22 3:56 PM

## 2022-08-30 ENCOUNTER — Encounter: Payer: PPO | Admitting: Physical Therapy

## 2022-09-01 ENCOUNTER — Encounter: Payer: PPO | Admitting: Urology

## 2022-09-02 ENCOUNTER — Encounter: Payer: PPO | Admitting: Physical Therapy

## 2022-09-09 ENCOUNTER — Encounter: Payer: Self-pay | Admitting: Urology

## 2022-09-09 ENCOUNTER — Ambulatory Visit (INDEPENDENT_AMBULATORY_CARE_PROVIDER_SITE_OTHER): Payer: PPO | Admitting: Urology

## 2022-09-09 VITALS — BP 126/71 | HR 52 | Ht 76.0 in | Wt 248.0 lb

## 2022-09-09 DIAGNOSIS — N281 Cyst of kidney, acquired: Secondary | ICD-10-CM | POA: Diagnosis not present

## 2022-09-09 DIAGNOSIS — N2889 Other specified disorders of kidney and ureter: Secondary | ICD-10-CM

## 2022-09-09 LAB — MICROSCOPIC EXAMINATION
Cast Type: NONE SEEN
Casts: NONE SEEN /lpf
Crystal Type: NONE SEEN
Crystals: NONE SEEN
Trichomonas, UA: NONE SEEN
Yeast, UA: NONE SEEN

## 2022-09-09 LAB — URINALYSIS, ROUTINE W REFLEX MICROSCOPIC
Bilirubin, UA: NEGATIVE
Glucose, UA: NEGATIVE
Leukocytes,UA: NEGATIVE
Nitrite, UA: NEGATIVE
Specific Gravity, UA: 1.03 (ref 1.005–1.030)
Urobilinogen, Ur: 0.2 mg/dL (ref 0.2–1.0)
pH, UA: 5.5 (ref 5.0–7.5)

## 2022-09-09 NOTE — Progress Notes (Signed)
Assessment: 1. Renal mass, right; Bosniak 4 complex cyst; 1 cm    Plan: I personally reviewed the patient's chart including provider notes, lab results and imaging results. I personally viewed the most recent MRI study from 08/16/2022 with results as noted below. I discussed the MRI findings with the patient in detail today. I discussed management options for incidentally found small renal masses including active surveillance, surgical management with partial nephrectomy, and percutaneous ablation. I would like to review his imaging studies with one of my colleagues who performs laparoscopic partial nephrectomy to determine if he would be a candidate for this procedure.  I will also discuss possible percutaneous ablation with interventional radiology.  Chief Complaint:  Chief Complaint  Patient presents with   Renal mass    History of Present Illness:  Nicholas Rasmussen is a 77 y.o. male who is seen in consultation from Nche, Bonna Gains, NP for evaluation of a right renal mass. CT abdomen with and without contrast from 12/17 showed complex left renal cyst with rim calcifications. CT the abdomen with and without contrast from 9/20 showed a left upper pole renal lesion stable in size with a rim calcification but without internal enhancement, Bosniak 37F cyst. CT abdomen with and without contrast from 2/22 showed a small indeterminate focus in the right kidney with slight increase in size consistent with possible small solid enhancing renal neoplasm and stable appearing left renal cyst. MRI of the abdomen from 3/22 showed a 8 mm complex cyst in the right kidney suggestive of a proteinaceous or hemorrhagic cyst. MRI abdomen with and without contrast from 11/22 showed a complex lesion in the right kidney measuring 7 x 8 mm in size likely representing a proteinaceous or hemorrhagic cyst. MRI abdomen with and without contrast from 05/14/2022 showed a 1 cm lesion in the right kidney with  homogeneous appearance. MRI abdomen with and without contrast from 2/24 showed the lesion within the right kidney with possible slight increase in size. MRI imaging from 3/24 showed a 1.2 x 1.1 cm right renal mass which appears to have slightly increased in size in comparison to a study from 07/22/2020, demonstrating internal complexity and gradual internal contrast-enhancement, concerning for a Bosniak 4 lesion.  There was no evidence of lymphadenopathy or metastatic disease.  He has a history of intermittent left sided flank pain.  No right-sided flank pain.  No significant lower urinary tract symptoms.  No dysuria or gross hematuria.  No weight loss or bone pain.  Past Medical History:  Past Medical History:  Diagnosis Date   Allergic rhinitis 05/07/2014   Diabetes mellitus without complication    Dizziness 01/02/2016   GERD (gastroesophageal reflux disease)    Headache 03/13/2015   Left flank pain 01/02/2016   Low back pain 06/06/2015   Pain of left side of body 08/13/2015   Post herpetic neuralgia    Routine general medical examination at a health care facility 05/07/2014   Seizure disorder 05/07/2014   Seizures    Last seizure in the 70s.   Shingles    Stye 05/07/2014    Past Surgical History:  Past Surgical History:  Procedure Laterality Date   COLONOSCOPY     KNEE SURGERY Left    left miniscus   UPPER GASTROINTESTINAL ENDOSCOPY     uvala surgery      Allergies:  No Known Allergies  Family History:  Family History  Problem Relation Age of Onset   Other Mother  natural causes   Diabetes Father    Hypertension Father    Kidney cancer Brother    Colon cancer Neg Hx    Stomach cancer Neg Hx    Esophageal cancer Neg Hx    Colon polyps Neg Hx     Social History:  Social History   Tobacco Use   Smoking status: Former   Smokeless tobacco: Never  Building services engineer Use: Never used  Substance Use Topics   Alcohol use: Yes    Alcohol/week: 0.0 standard  drinks of alcohol    Comment: occasional   Drug use: No    Review of symptoms:  Constitutional:  Negative for unexplained weight loss, night sweats, fever, chills ENT:  Negative for nose bleeds, sinus pain, painful swallowing CV:  Negative for chest pain, shortness of breath, exercise intolerance, palpitations, loss of consciousness Resp:  Negative for cough, wheezing, shortness of breath GI:  Negative for nausea, vomiting, diarrhea, bloody stools GU:  Positives noted in HPI; otherwise negative for gross hematuria, dysuria, urinary incontinence Neuro:  Negative for seizures, poor balance, limb weakness, slurred speech Psych:  Negative for lack of energy, depression, anxiety Endocrine:  Negative for polydipsia, polyuria, symptoms of hypoglycemia (dizziness, hunger, sweating) Hematologic:  Negative for anemia, purpura, petechia, prolonged or excessive bleeding, use of anticoagulants  Allergic:  Negative for difficulty breathing or choking as a result of exposure to anything; no shellfish allergy; no allergic response (rash/itch) to materials, foods  Physical exam: BP 126/71   Pulse (!) 52   Ht  (1.93 m)   Wt 248 lb (112.5 kg)   BMI 30.19 kg/m  GENERAL APPEARANCE:  Well appearing, well developed, well nourished, NAD HEENT: Atraumatic, Normocephalic, oropharynx clear. NECK: Supple without lymphadenopathy or thyromegaly. LUNGS: Clear to auscultation bilaterally. HEART: Regular Rate and Rhythm without murmurs, gallops, or rubs. ABDOMEN: Soft, non-tender, No Masses. EXTREMITIES: Moves all extremities well.  Without clubbing, cyanosis, or edema. NEUROLOGIC:  Alert and oriented x 3, normal gait, CN II-XII grossly intact.  MENTAL STATUS:  Appropriate. BACK:  Non-tender to palpation.  No CVAT SKIN:  Warm, dry and intact.    Results: U/A:  0-5 WBC, 0-2 RBC, mod bacteria

## 2022-09-13 ENCOUNTER — Encounter: Payer: Self-pay | Admitting: Urology

## 2022-09-13 ENCOUNTER — Other Ambulatory Visit: Payer: Self-pay | Admitting: Urology

## 2022-09-13 DIAGNOSIS — N2889 Other specified disorders of kidney and ureter: Secondary | ICD-10-CM

## 2022-09-15 ENCOUNTER — Telehealth: Payer: Self-pay | Admitting: Urology

## 2022-09-15 NOTE — Telephone Encounter (Signed)
Patient called and left a voicemail in regards to a referral that was sent over to [facility] and he wanted to speak to someone about this.   Patients contact #: [phone]

## 2022-09-16 NOTE — Telephone Encounter (Signed)
Patient called back since he had not heard from anyone yet, patient stated [facility] has him scheduled for 5/23, and patient stated [facility] wanted patient to reach out to send any notes pertaining to this and also wanted to make sure that waiting that long is OKAY and patient would like to discuss this with someone.  Patients callback #: [phone]

## 2022-09-22 ENCOUNTER — Other Ambulatory Visit: Payer: Self-pay | Admitting: Nurse Practitioner

## 2022-09-22 DIAGNOSIS — K2100 Gastro-esophageal reflux disease with esophagitis, without bleeding: Secondary | ICD-10-CM

## 2022-09-23 ENCOUNTER — Encounter: Payer: Self-pay | Admitting: Physical Therapy

## 2022-09-23 ENCOUNTER — Ambulatory Visit (INDEPENDENT_AMBULATORY_CARE_PROVIDER_SITE_OTHER): Payer: PPO | Admitting: Physical Therapy

## 2022-09-23 DIAGNOSIS — M79605 Pain in left leg: Secondary | ICD-10-CM | POA: Diagnosis not present

## 2022-09-23 DIAGNOSIS — M6281 Muscle weakness (generalized): Secondary | ICD-10-CM | POA: Diagnosis not present

## 2022-09-23 DIAGNOSIS — M62830 Muscle spasm of back: Secondary | ICD-10-CM | POA: Diagnosis not present

## 2022-09-23 DIAGNOSIS — G8929 Other chronic pain: Secondary | ICD-10-CM | POA: Diagnosis not present

## 2022-09-23 DIAGNOSIS — M25562 Pain in left knee: Secondary | ICD-10-CM | POA: Diagnosis not present

## 2022-09-23 DIAGNOSIS — M5459 Other low back pain: Secondary | ICD-10-CM | POA: Diagnosis not present

## 2022-09-23 DIAGNOSIS — M79604 Pain in right leg: Secondary | ICD-10-CM

## 2022-09-23 NOTE — Therapy (Signed)
OUTPATIENT PHYSICAL THERAPY TREATMENT/RECERT   Patient Name: Nicholas Rasmussen MRN: [id number] DOB:[birth year], 77 y.o., male Today's Date: 09/23/2022  END OF SESSION:  PT End of Session - 09/23/22 0941     Visit Number 11    Number of Visits 15    Date for PT Re-Evaluation 10/28/22    Authorization Type [employer]    PT Start Time 0931    PT Stop Time 1020    PT Time Calculation (min) 49 min    Activity Tolerance Patient tolerated treatment well    Behavior During Therapy [facility] for tasks assessed/performed              Past Medical History:  Diagnosis Date   Allergic rhinitis 05/07/2014   Diabetes mellitus without complication (HCC)    Dizziness 01/02/2016   GERD (gastroesophageal reflux disease)    Headache 03/13/2015   Left flank pain 01/02/2016   Low back pain 06/06/2015   Pain of left side of body 08/13/2015   Post herpetic neuralgia    Routine general medical examination at a health care facility 05/07/2014   Seizure disorder (HCC) 05/07/2014   Seizures (HCC)    Last seizure in the 70s.   Shingles    Stye 05/07/2014   Past Surgical History:  Procedure Laterality Date   COLONOSCOPY     KNEE SURGERY Left    left miniscus   UPPER GASTROINTESTINAL ENDOSCOPY     uvala surgery     Patient Active Problem List   Diagnosis Date Noted   Renal mass, right; Bosniak 4 complex cyst; 1 cm 09/09/2022   Acute medial meniscus tear of left knee 02/04/2022   Primary osteoarthritis of left knee 02/04/2022   Aortic root dilatation (HCC) 12/18/2021   Slow transit constipation 12/08/2021   Chest wall muscle strain 12/08/2021   Multiple pulmonary nodules 10/06/2021   Hyperlipidemia associated with type 2 diabetes mellitus (HCC) 08/19/2021   [name] 04/07/2021   Complex renal cyst 04/07/2021   Varicose veins of both lower extremities with pain 02/02/2019   Encounter for therapeutic drug level monitoring 09/19/2018   Type 2  diabetes mellitus with diabetic polyneuropathy, without long-term current use of insulin (HCC) 03/29/2018   Diabetic neuropathy (HCC) 03/29/2018   Chronic pain of left knee 03/23/2018   Coronary artery disease involving native coronary artery of native heart with angina pectoris (HCC) 08/03/2017   Post herpetic neuralgia 07/29/2016   Dizziness 01/02/2016   Chronic midline low back pain without sciatica 06/06/2015   Nonintractable episodic headache 03/13/2015   GERD (gastroesophageal reflux disease) 11/15/2014   Seizure disorder (HCC) 05/07/2014   Allergic rhinitis 05/07/2014    PCP: Nicholas Ng, NP  REFERRING PROVIDER: Anne Ng, NP  REFERRING DIAG: M54.50,G89.29 (ICD-10-CM) - Chronic midline low back pain without sciatica M25.562,G89.29 (ICD-10-CM) - Chronic pain of left knee  Rationale for Evaluation and Treatment: Rehabilitation  THERAPY DIAG:  Chronic pain of left knee  Other low back pain  Pain in right leg  Pain in left leg  Muscle weakness (generalized)  Muscle spasm of back  ONSET DATE: a few weeks ago  SUBJECTIVE:  SUBJECTIVE STATEMENT: He states his he is having more overall pain and having some swelling in his knee. He has not been very compliant with HEP lately. His back pain is also higher today PERTINENT HISTORY:  PMH: diabetes, HTN, CAD, seizures, OA and medial meniscus tear of left knee  PAIN:  Are you having pain? 7/10 in back and left knee. PRECAUTIONS: None  WEIGHT BEARING RESTRICTIONS: No  FALLS:  Has patient fallen in last 6 months? No  LIVING ENVIRONMENT: Lives in: House/apartment Stairs: 1 flight on stairs, said he goes up and down sideways  OCCUPATION: retired -- enjoys dancing, has an event coming up 3/7   PLOF: Independent  PATIENT  GOALS: get back to dancing   NEXT MD VISIT:   OBJECTIVE:   DIAGNOSTIC FINDINGS:  IMPRESSION: (for left knee) 1. Oblique tear of the posterior horn of the medial meniscus extending to the inferior articular surface. 2. High-grade partial-thickness cartilage loss of the medial femorotibial compartment with areas of full-thickness cartilage loss of the weight-bearing surface of the medial femoral condyle.  PATIENT SURVEYS:  Eval: FOTO 46% goal 55% 08/25/21: FOTO 73%  SCREENING FOR RED FLAGS: Bowel or bladder incontinence: No Cauda equina syndrome: No   COGNITION: Overall cognitive status: Within functional limits for tasks assessed     SENSATION: Light touch: WFL   PALPATION: TTP right gluteal region and greater trochanter, left knee joint line and inferior patella No pain or hypomobility noted in lumbar spine with PAMs   LUMBAR ROM:   AROM eval 08/26/22  Flexion [facility]  WFL  Extension 75% WFL  Right lateral flexion [facility] WFL  Left lateral flexion [facility] WFL  Right rotation 50% WFL  Left rotation 50% WFL   (Blank rows = not tested)  LOWER EXTREMITY ROM:     Active  Right eval Left eval Left 08/17/22  Hip flexion     Hip extension     Hip abduction     Hip adduction     Hip internal rotation     Hip external rotation     Knee flexion 121 106 A:120 AAROM: 125  Knee extension 0 2   Ankle dorsiflexion     Ankle plantarflexion     Ankle inversion     Ankle eversion      (Blank rows = not tested)  LOWER EXTREMITY MMT:    MMT tested in sitting Right eval Left eval Rt/Lt 08/26/22  Hip flexion 5 4+   Hip extension 4 4   Hip abduction 5 5   Hip adduction     Hip internal rotation     Hip external rotation     Knee flexion 5 4+   Knee extension 5 4   Ankle dorsiflexion     Ankle plantarflexion     Ankle inversion     Ankle eversion      (Blank rows = not tested)  LUMBAR SPECIAL TESTS:  Eval: Slump test: Negative  FUNCTIONAL TESTS:  Eval: 5 times sit to  stand: 16.41sec without UE support, decreased weight shift left and some associated knee pain with this   SLS around 20 sec each leg    08/26/22: 5 times sit to stand 17 seconds without UE support     TODAY'S TREATMENT:  09/23/22 TherEx  Recumbent bike L3 X 10 min Standing hip abduction and extension green X 15 bilat Leg press DL 161# [id number], 45# W09 SL each side Seated Pball lumbar flexion roll outs 5 sec hold  X5 reps fwd and diagonals Standing hip abductions green X 15 bilat Standing hip extensions green X 15 bilat Knee extension machine 10# DL 1O10 Hamstring machine 35# DL 9U04 Seated SLR 4# 5W09 bilat -Vasopnuematic device X 10 min, medium compression, 34 deg to Lt knee   08/26/22 TherEx  Recumbent bike L3 X 10 min Updated measurements and goals Leg press DL 811# [id number], 82# 9F62 SL each side Seated Pball lumbar flexion roll outs 5 sec hold  X5 reps fwd and diagonals Wall 1/4 squats with red ball behind back, 2X10 Step ups on 6 inch step with one UE support X 10 forward and X 10 lateral for both legs Seated SLR 3# 2X10 bilat   08/24/22 TherEx  Recumbent bike L3 X 10 min Leg press DL 130# 8M57, 84# 6N62 SL each side Seated Pball lumbar flexion roll outs 5 sec hold  X5 reps fwd and diagonals Wall 1/4 squats with red ball behind back, 2X10 Step ups on 6 inch step with one UE support X 10 forward and X 10 lateral for both legs Seated SLR 3# 2X10 bilat   -Vasopnuematic device X 10 min, medium compression, 34 deg to Lt knee    PATIENT EDUCATION: Education details: HEP, PT plan of care Person educated: Patient Education method: Explanation, Demonstration, Verbal cues, and Handouts Education comprehension: verbalized understanding and needs further education   HOME EXERCISE PROGRAM: Access Code: [email or web address] URL: [email or web address] Date: 07/14/2022 Prepared by: Ivery Quale  Exercises - Supine Bridge  - 2 x daily - 6 x weekly - 1-2 sets - 10 reps - 5  hold - Supine Straight Leg Raise  - 2 x daily - 6 x weekly - 3 sets - 10 reps - Sit to Stand Without Arm Support  - 2 x daily - 6 x weekly - 1-2 sets - 10 reps - Supine Hamstring Stretch with Strap  - 2 x daily - 6 x weekly - 1 sets - 3 reps - 30 hold - Seated Knee Flexion AAROM  - 2 x daily - 6 x weekly - 1 sets - 10 reps - 5 hold - Hip Extension with Resistance Loop  - 2 x daily - 6 x weekly - 1-2 sets - 10 reps - Standing Hip Abduction with Resistance at Ankles and Counter Support  - 2 x daily - 6 x weekly - 1-2 sets - 10 reps  ASSESSMENT:  CLINICAL IMPRESSION: He relays the pain has come back but he has also not been as compliant with HEP as he should. Recert today as his PT plan of care has expired and we will plan to see him one time a week for up to another 4 weeks and encourage more HEP.   OBJECTIVE IMPAIRMENTS: decreased activity tolerance, difficulty walking, decreased balance, decreased endurance, decreased mobility, decreased ROM, decreased strength, impaired flexibility, impaired LE use, postural dysfunction, and pain.  ACTIVITY LIMITATIONS: bending, lifting, carry, locomotion, cleaning, community activity, driving, and dancing   PERSONAL FACTORS: PMH: diabetes, HTN, CAD, seizures, OA and medial meniscus tear of left knee are also affecting patient's functional outcome.  REHAB POTENTIAL: Good  CLINICAL DECISION MAKING: Evolving/moderate complexity  EVALUATION COMPLEXITY: Moderate    GOALS: Short term PT Goals Target date: 08/11/2022     Pt will be I and compliant with HEP. Baseline:  Goal status: MET 08/11/22 Pt will decrease pain by 25% overall Baseline: Goal status: MET 08/11/22   Long term PT goals Target date:10/28/2022     Pt  will improve lumbar ROM to [facility] to improve functional mobility Baseline: Goal status: MET 08/26/22 Pt will improve  hip/knee strength to at least 5-/5 MMT to improve functional strength Baseline: Goal status: MET 08/26/22 Pt will improve  FOTO to at least 55% functional to show improved function Baseline: Goal status: MET 08/26/22 Pt will reduce pain to overall less than 2-3/10 with usual activity and dancing Baseline: Goal status: mostly met Pt will reduce 5 times STS in under 13 sec to show improved function Baseline: Goal status: ongoing, currently takes 16 seconds  PLAN: PT FREQUENCY: 1-2 times per week   PT DURATION: 4-6 weeks  PLANNED INTERVENTIONS (unless contraindicated): aquatic PT, Canalith repositioning, cryotherapy, Electrical stimulation, Iontophoresis with 4 mg/ml dexamethasome, Moist heat, traction, Ultrasound, gait training, Therapeutic exercise, balance training, neuromuscular re-education, patient/family education, prosthetic training, manual techniques, passive ROM, dry needling, taping, vasopnuematic device, vestibular, spinal manipulations, joint manipulations  PLAN FOR NEXT SESSION: PT one time a week and work on HEP 3 days per week  Ivery Quale, PT, DPT 09/23/22 10:26 AM

## 2022-09-29 ENCOUNTER — Ambulatory Visit: Payer: PPO | Admitting: Physical Therapy

## 2022-09-29 ENCOUNTER — Encounter: Payer: Self-pay | Admitting: Physical Therapy

## 2022-09-29 DIAGNOSIS — M5459 Other low back pain: Secondary | ICD-10-CM

## 2022-09-29 DIAGNOSIS — M79604 Pain in right leg: Secondary | ICD-10-CM

## 2022-09-29 DIAGNOSIS — G8929 Other chronic pain: Secondary | ICD-10-CM | POA: Diagnosis not present

## 2022-09-29 DIAGNOSIS — M25562 Pain in left knee: Secondary | ICD-10-CM | POA: Diagnosis not present

## 2022-09-29 DIAGNOSIS — M6281 Muscle weakness (generalized): Secondary | ICD-10-CM

## 2022-09-29 DIAGNOSIS — M79605 Pain in left leg: Secondary | ICD-10-CM | POA: Diagnosis not present

## 2022-09-29 NOTE — Therapy (Signed)
OUTPATIENT PHYSICAL THERAPY TREATMENT   Patient Name: Nicholas Rasmussen MRN: [id number] DOB:21-Nov-1945, 77 y.o., male Today's Date: 09/29/2022  END OF SESSION:  PT End of Session - 09/29/22 1013     Visit Number 12    Number of Visits 15    Date for PT Re-Evaluation 10/28/22    Authorization Type [employer]    PT Start Time 1015    PT Stop Time 1057    PT Time Calculation (min) 42 min    Activity Tolerance Patient tolerated treatment well    Behavior During Therapy [facility] for tasks assessed/performed               Past Medical History:  Diagnosis Date   Allergic rhinitis 05/07/2014   Diabetes mellitus without complication (HCC)    Dizziness 01/02/2016   GERD (gastroesophageal reflux disease)    Headache 03/13/2015   Left flank pain 01/02/2016   Low back pain 06/06/2015   Pain of left side of body 08/13/2015   Post herpetic neuralgia    Routine general medical examination at a health care facility 05/07/2014   Seizure disorder (HCC) 05/07/2014   Seizures (HCC)    Last seizure in the 70s.   Shingles    Stye 05/07/2014   Past Surgical History:  Procedure Laterality Date   COLONOSCOPY     KNEE SURGERY Left    left miniscus   UPPER GASTROINTESTINAL ENDOSCOPY     uvala surgery     Patient Active Problem List   Diagnosis Date Noted   Renal mass, right; Bosniak 4 complex cyst; 1 cm 09/09/2022   Acute medial meniscus tear of left knee 02/04/2022   Primary osteoarthritis of left knee 02/04/2022   Aortic root dilatation (HCC) 12/18/2021   Slow transit constipation 12/08/2021   Chest wall muscle strain 12/08/2021   Multiple pulmonary nodules 10/06/2021   Hyperlipidemia associated with type 2 diabetes mellitus (HCC) 08/19/2021   [name] 04/07/2021   Complex renal cyst 04/07/2021   Varicose veins of both lower extremities with pain 02/02/2019   Encounter for therapeutic drug level monitoring 09/19/2018   Type 2  diabetes mellitus with diabetic polyneuropathy, without long-term current use of insulin (HCC) 03/29/2018   Diabetic neuropathy (HCC) 03/29/2018   Chronic pain of left knee 03/23/2018   Coronary artery disease involving native coronary artery of native heart with angina pectoris (HCC) 08/03/2017   Post herpetic neuralgia 07/29/2016   Dizziness 01/02/2016   Chronic midline low back pain without sciatica 06/06/2015   Nonintractable episodic headache 03/13/2015   GERD (gastroesophageal reflux disease) 11/15/2014   Seizure disorder (HCC) 05/07/2014   Allergic rhinitis 05/07/2014    PCP: Anne Ng, NP  REFERRING PROVIDER: Anne Ng, NP  REFERRING DIAG: M54.50,G89.29 (ICD-10-CM) - Chronic midline low back pain without sciatica M25.562,G89.29 (ICD-10-CM) - Chronic pain of left knee  Rationale for Evaluation and Treatment: Rehabilitation  THERAPY DIAG:  Other low back pain  Chronic pain of left knee  Pain in right leg  Pain in left leg  Muscle weakness (generalized)  ONSET DATE: a few weeks ago  SUBJECTIVE:  SUBJECTIVE STATEMENT: Having some lower back pain today, woke up yesterday with it.  Knee is intermittent and lower today  PERTINENT HISTORY:  PMH: diabetes, HTN, CAD, seizures, OA and medial meniscus tear of left knee  PAIN:  Are you having pain? 7/10 in back and 5/10 left knee.  PRECAUTIONS: None  WEIGHT BEARING RESTRICTIONS: No  FALLS:  Has patient fallen in last 6 months? No  LIVING ENVIRONMENT: Lives in: House/apartment Stairs: 1 flight on stairs, said he goes up and down sideways  OCCUPATION: retired -- enjoys dancing, has an event coming up 3/7   PLOF: Independent  PATIENT GOALS: get back to dancing   OBJECTIVE:   DIAGNOSTIC FINDINGS:  IMPRESSION:  (for left knee) 1. Oblique tear of the posterior horn of the medial meniscus extending to the inferior articular surface. 2. High-grade partial-thickness cartilage loss of the medial femorotibial compartment with areas of full-thickness cartilage loss of the weight-bearing surface of the medial femoral condyle.  PATIENT SURVEYS:  Eval: FOTO 46% goal 55% 08/25/21: FOTO 73%  SCREENING FOR RED FLAGS: Bowel or bladder incontinence: No Cauda equina syndrome: No   COGNITION: Overall cognitive status: Within functional limits for tasks assessed     SENSATION: Light touch: WFL   PALPATION: TTP right gluteal region and greater trochanter, left knee joint line and inferior patella No pain or hypomobility noted in lumbar spine with PAMs   LUMBAR ROM:   AROM eval 08/26/22  Flexion [facility]  WFL  Extension 75% WFL  Right lateral flexion [facility] WFL  Left lateral flexion [facility] WFL  Right rotation 50% WFL  Left rotation 50% WFL   (Blank rows = not tested)  LOWER EXTREMITY ROM:     Active  Right eval Left eval Left 08/17/22  Knee flexion 121 106 A:120 AAROM: 125  Knee extension 0 2    (Blank rows = not tested)  LOWER EXTREMITY MMT:    MMT tested in sitting Right eval Left eval Rt/Lt 08/26/22  Hip flexion 5 4+   Hip extension 4 4   Hip abduction 5 5   Knee flexion 5 4+   Knee extension 5 4    (Blank rows = not tested)  LUMBAR SPECIAL TESTS:  Eval: Slump test: Negative  FUNCTIONAL TESTS:  Eval: 5 times sit to stand: 16.41sec without UE support, decreased weight shift left and some associated knee pain with this  SLS around 20 sec each leg   08/26/22: 5 times sit to stand 17 seconds without UE support     TODAY'S TREATMENT:  09/29/22 TherEx Recumbent bike L4 X 10 min Leg press DL 102# 7O53, 66# 4Q03 SL each side Single knee to chest 3x30 sec bil Piriformis stretch 3x30 sec bil Demonstrated use of tennis ball for self mobilization to low back if  needed  09/23/22 TherEx Recumbent bike L3 X 10 min Standing hip abduction and extension green X 15 bilat Leg press DL 474# 2V95, 63# O75 SL each side Seated Pball lumbar flexion roll outs 5 sec hold  X5 reps fwd and diagonals Standing hip abductions green X 15 bilat Standing hip extensions green X 15 bilat Knee extension machine 10# DL [id number] Hamstring machine 35# DL 2R51 Seated SLR 4# 8A41 bilat -Vasopnuematic device X 10 min, medium compression, 34 deg to Lt knee   08/26/22 TherEx  Recumbent bike L3 X 10 min Updated measurements and goals Leg press DL 660# 6T01, 60# 1U93 SL each side Seated Pball lumbar flexion roll outs 5 sec  hold  X5 reps fwd and diagonals Wall 1/4 squats with red ball behind back, 2X10 Step ups on 6 inch step with one UE support X 10 forward and X 10 lateral for both legs Seated SLR 3# 2X10 bilat   08/24/22 TherEx  Recumbent bike L3 X 10 min Leg press DL 578# 4O96, 29# 5M84 SL each side Seated Pball lumbar flexion roll outs 5 sec hold  X5 reps fwd and diagonals Wall 1/4 squats with red ball behind back, 2X10 Step ups on 6 inch step with one UE support X 10 forward and X 10 lateral for both legs Seated SLR 3# 2X10 bilat   -Vasopnuematic device X 10 min, medium compression, 34 deg to Lt knee    PATIENT EDUCATION: Education details: HEP, PT plan of care Person educated: Patient Education method: Explanation, Demonstration, Verbal cues, and Handouts Education comprehension: verbalized understanding and needs further education   HOME EXERCISE PROGRAM: Access Code: [email or web address] URL: [email or web address] Date: 07/14/2022 Prepared by: Ivery Quale  Exercises - Supine Bridge  - 2 x daily - 6 x weekly - 1-2 sets - 10 reps - 5 hold - Supine Straight Leg Raise  - 2 x daily - 6 x weekly - 3 sets - 10 reps - Sit to Stand Without Arm Support  - 2 x daily - 6 x weekly - 1-2 sets - 10 reps - Supine Hamstring Stretch with Strap  - 2 x daily - 6 x  weekly - 1 sets - 3 reps - 30 hold - Seated Knee Flexion AAROM  - 2 x daily - 6 x weekly - 1 sets - 10 reps - 5 hold - Hip Extension with Resistance Loop  - 2 x daily - 6 x weekly - 1-2 sets - 10 reps - Standing Hip Abduction with Resistance at Ankles and Counter Support  - 2 x daily - 6 x weekly - 1-2 sets - 10 reps  ASSESSMENT:  CLINICAL IMPRESSION:  Pt tolerated session well today with continued focus on strengthening as well as some low back stretches due to increase in pain.  Will continue to benefit from PT to maximize function.  OBJECTIVE IMPAIRMENTS: decreased activity tolerance, difficulty walking, decreased balance, decreased endurance, decreased mobility, decreased ROM, decreased strength, impaired flexibility, impaired LE use, postural dysfunction, and pain.  ACTIVITY LIMITATIONS: bending, lifting, carry, locomotion, cleaning, community activity, driving, and dancing   PERSONAL FACTORS: PMH: diabetes, HTN, CAD, seizures, OA and medial meniscus tear of left knee are also affecting patient's functional outcome.  REHAB POTENTIAL: Good  CLINICAL DECISION MAKING: Evolving/moderate complexity  EVALUATION COMPLEXITY: Moderate    GOALS: Short term PT Goals Target date: 08/11/2022     Pt will be I and compliant with HEP. Baseline:  Goal status: MET 08/11/22 Pt will decrease pain by 25% overall Baseline: Goal status: MET 08/11/22   Long term PT goals Target date:10/28/2022     Pt will improve lumbar ROM to [facility] to improve functional mobility Baseline: Goal status: MET 08/26/22 Pt will improve  hip/knee strength to at least 5-/5 MMT to improve functional strength Baseline: Goal status: MET 08/26/22 Pt will improve FOTO to at least 55% functional to show improved function Baseline: Goal status: MET 08/26/22 Pt will reduce pain to overall less than 2-3/10 with usual activity and dancing Baseline: Goal status: mostly met Pt will reduce 5 times STS in under 13 sec to show improved  function Baseline: Goal status: ongoing, currently takes 16 seconds  PLAN: PT FREQUENCY:  1-2 times per week   PT DURATION: 4-6 weeks  PLANNED INTERVENTIONS (unless contraindicated): aquatic PT, Canalith repositioning, cryotherapy, Electrical stimulation, Iontophoresis with 4 mg/ml dexamethasome, Moist heat, traction, Ultrasound, gait training, Therapeutic exercise, balance training, neuromuscular re-education, patient/family education, prosthetic training, manual techniques, passive ROM, dry needling, taping, vasopnuematic device, vestibular, spinal manipulations, joint manipulations  PLAN FOR NEXT SESSION: continue strengthening, manual/stretching PRN    Clarita Crane, PT, DPT 09/29/22 11:11 AM

## 2022-10-01 DIAGNOSIS — E119 Type 2 diabetes mellitus without complications: Secondary | ICD-10-CM | POA: Diagnosis not present

## 2022-10-04 NOTE — Progress Notes (Addendum)
Office Visit Note   Patient: Nicholas Rasmussen           Date of Birth: [birth year]           MRN: [id number] Visit Date: 10/05/2022              Requested by: Anne Ng, NP [address] [place],  [place] [address] PCP: Anne Ng, NP   Assessment & Plan: Visit Diagnoses: No diagnosis found.  Plan: ***  Follow-Up Instructions: No follow-ups on file.   Orders:  No orders of the defined types were placed in this encounter.  No orders of the defined types were placed in this encounter.     Procedures: No procedures performed   Clinical Data: No additional findings.   Subjective: No chief complaint on file.   HPI  Review of Systems  Constitutional: Negative.   HENT: Negative.    Eyes: Negative.   Respiratory: Negative.    Cardiovascular: Negative.   Gastrointestinal: Negative.   Endocrine: Negative.   Genitourinary: Negative.   Skin: Negative.   Allergic/Immunologic: Negative.   Neurological: Negative.   Hematological: Negative.   Psychiatric/Behavioral: Negative.    All other systems reviewed and are negative.   Objective: Vital Signs: There were no vitals taken for this visit.  Physical Exam Vitals and nursing note reviewed.  Constitutional:      Appearance: He is well-developed.  HENT:     Head: Normocephalic and atraumatic.  Eyes:     Pupils: Pupils are equal, round, and reactive to light.  Pulmonary:     Effort: Pulmonary effort is normal.  Abdominal:     Palpations: Abdomen is soft.  Musculoskeletal:        General: Normal range of motion.     Cervical back: Neck supple.  Skin:    General: Skin is warm.  Neurological:     Mental Status: He is alert and oriented to person, place, and time.  Psychiatric:        Behavior: Behavior normal.        Thought Content: Thought content normal.        Judgment: Judgment normal.   Ortho Exam  Specialty Comments:  No specialty comments available.  Imaging: No  results found.   PMFS History: Patient Active Problem List   Diagnosis Date Noted  . Renal mass, right; Bosniak 4 complex cyst; 1 cm 09/09/2022  . Acute medial meniscus tear of left knee 02/04/2022  . Primary osteoarthritis of left knee 02/04/2022  . Aortic root dilatation (HCC) 12/18/2021  . Slow transit constipation 12/08/2021  . Chest wall muscle strain 12/08/2021  . Multiple pulmonary nodules 10/06/2021  . Hyperlipidemia associated with type 2 diabetes mellitus (HCC) 08/19/2021  . [name] 04/07/2021  . Complex renal cyst 04/07/2021  . Varicose veins of both lower extremities with pain 02/02/2019  . Encounter for therapeutic drug level monitoring 09/19/2018  . Type 2 diabetes mellitus with diabetic polyneuropathy, without long-term current use of insulin (HCC) 03/29/2018  . Diabetic neuropathy (HCC) 03/29/2018  . Chronic pain of left knee 03/23/2018  . Coronary artery disease involving native coronary artery of native heart with angina pectoris (HCC) 08/03/2017  . Post herpetic neuralgia 07/29/2016  . Dizziness 01/02/2016  . Chronic midline low back pain without sciatica 06/06/2015  . Nonintractable episodic headache 03/13/2015  . GERD (gastroesophageal reflux disease) 11/15/2014  . Seizure disorder (HCC) 05/07/2014  . Allergic rhinitis 05/07/2014   Past  Medical History:  Diagnosis Date  . Allergic rhinitis 05/07/2014  . Diabetes mellitus without complication (HCC)   . Dizziness 01/02/2016  . GERD (gastroesophageal reflux disease)   . Headache 03/13/2015  . Left flank pain 01/02/2016  . Low back pain 06/06/2015  . Pain of left side of body 08/13/2015  . Post herpetic neuralgia   . Routine general medical examination at a health care facility 05/07/2014  . Seizure disorder (HCC) 05/07/2014  . Seizures (HCC)    Last seizure in the 70s.  . Shingles   . Stye 05/07/2014    Family History  Problem Relation Age of Onset  . Other  Mother        natural causes  . Diabetes Father   . Hypertension Father   . Kidney cancer Brother   . Colon cancer Neg Hx   . Stomach cancer Neg Hx   . Esophageal cancer Neg Hx   . Colon polyps Neg Hx     Past Surgical History:  Procedure Laterality Date  . COLONOSCOPY    . KNEE SURGERY Left    left miniscus  . UPPER GASTROINTESTINAL ENDOSCOPY    . Johna Sheriff surgery     Social History   Occupational History  . Occupation: Retired    Comment: Printmaker  Tobacco Use  . Smoking status: Former  . Smokeless tobacco: Never  Vaping Use  . Vaping Use: Never used  Substance and Sexual Activity  . Alcohol use: Yes    Alcohol/week: 0.0 standard drinks of alcohol    Comment: occasional  . Drug use: No  . Sexual activity: Not on file

## 2022-10-05 ENCOUNTER — Ambulatory Visit (INDEPENDENT_AMBULATORY_CARE_PROVIDER_SITE_OTHER): Payer: PPO | Admitting: Orthopaedic Surgery

## 2022-10-05 DIAGNOSIS — M1712 Unilateral primary osteoarthritis, left knee: Secondary | ICD-10-CM

## 2022-10-07 ENCOUNTER — Encounter: Payer: Self-pay | Admitting: Physical Therapy

## 2022-10-07 ENCOUNTER — Ambulatory Visit (INDEPENDENT_AMBULATORY_CARE_PROVIDER_SITE_OTHER): Payer: PPO | Admitting: Physical Therapy

## 2022-10-07 DIAGNOSIS — M79604 Pain in right leg: Secondary | ICD-10-CM

## 2022-10-07 DIAGNOSIS — M25562 Pain in left knee: Secondary | ICD-10-CM | POA: Diagnosis not present

## 2022-10-07 DIAGNOSIS — M5459 Other low back pain: Secondary | ICD-10-CM | POA: Diagnosis not present

## 2022-10-07 DIAGNOSIS — M6281 Muscle weakness (generalized): Secondary | ICD-10-CM | POA: Diagnosis not present

## 2022-10-07 DIAGNOSIS — G8929 Other chronic pain: Secondary | ICD-10-CM

## 2022-10-07 DIAGNOSIS — M79605 Pain in left leg: Secondary | ICD-10-CM | POA: Diagnosis not present

## 2022-10-07 NOTE — Therapy (Signed)
OUTPATIENT PHYSICAL THERAPY TREATMENT   Patient Name: Nicholas Rasmussen MRN: [id number] DOB:[birth year], 77 y.o., male Today's Date: 10/07/2022  END OF SESSION:  PT End of Session - 10/07/22 1111     Visit Number 13    Number of Visits 15    Date for PT Re-Evaluation 10/28/22    Authorization Type [employer]    PT Start Time 1105    PT Stop Time 1145    PT Time Calculation (min) 40 min    Activity Tolerance Patient tolerated treatment well    Behavior During Therapy [facility] for tasks assessed/performed               Past Medical History:  Diagnosis Date   Allergic rhinitis 05/07/2014   Diabetes mellitus without complication (HCC)    Dizziness 01/02/2016   GERD (gastroesophageal reflux disease)    Headache 03/13/2015   Left flank pain 01/02/2016   Low back pain 06/06/2015   Pain of left side of body 08/13/2015   Post herpetic neuralgia    Routine general medical examination at a health care facility 05/07/2014   Seizure disorder (HCC) 05/07/2014   Seizures (HCC)    Last seizure in the 70s.   Shingles    Stye 05/07/2014   Past Surgical History:  Procedure Laterality Date   COLONOSCOPY     KNEE SURGERY Left    left miniscus   UPPER GASTROINTESTINAL ENDOSCOPY     uvala surgery     Patient Active Problem List   Diagnosis Date Noted   Renal mass, right; Bosniak 4 complex cyst; 1 cm 09/09/2022   Acute medial meniscus tear of left knee 02/04/2022   Primary osteoarthritis of left knee 02/04/2022   Aortic root dilatation (HCC) 12/18/2021   Slow transit constipation 12/08/2021   Chest wall muscle strain 12/08/2021   Multiple pulmonary nodules 10/06/2021   Hyperlipidemia associated with type 2 diabetes mellitus (HCC) 08/19/2021   [name] 04/07/2021   Complex renal cyst 04/07/2021   Varicose veins of both lower extremities with pain 02/02/2019   Encounter for therapeutic drug level monitoring 09/19/2018   Type 2  diabetes mellitus with diabetic polyneuropathy, without long-term current use of insulin (HCC) 03/29/2018   Diabetic neuropathy (HCC) 03/29/2018   Chronic pain of left knee 03/23/2018   Coronary artery disease involving native coronary artery of native heart with angina pectoris (HCC) 08/03/2017   Post herpetic neuralgia 07/29/2016   Dizziness 01/02/2016   Chronic midline low back pain without sciatica 06/06/2015   Nonintractable episodic headache 03/13/2015   GERD (gastroesophageal reflux disease) 11/15/2014   Seizure disorder (HCC) 05/07/2014   Allergic rhinitis 05/07/2014    PCP: Anne Ng, NP  REFERRING PROVIDER: Anne Ng, NP  REFERRING DIAG: M54.50,G89.29 (ICD-10-CM) - Chronic midline low back pain without sciatica M25.562,G89.29 (ICD-10-CM) - Chronic pain of left knee  Rationale for Evaluation and Treatment: Rehabilitation  THERAPY DIAG:  Other low back pain  Chronic pain of left knee  Pain in right leg  Pain in left leg  Muscle weakness (generalized)  ONSET DATE: a few weeks ago  SUBJECTIVE:  SUBJECTIVE STATEMENT: Relays the pain is not doing well today, pain feels more in his knee cap. The pain is intermittent and when standing still he doesn't have pain but when he bends his knee or does certain things the pain is there  PERTINENT HISTORY:  PMH: diabetes, HTN, CAD, seizures, OA and medial meniscus tear of left knee  PAIN:  Are you having pain? 7/10 in back and knee.   PRECAUTIONS: None  WEIGHT BEARING RESTRICTIONS: No  FALLS:  Has patient fallen in last 6 months? No  LIVING ENVIRONMENT: Lives in: House/apartment Stairs: 1 flight on stairs, said he goes up and down sideways  OCCUPATION: retired -- enjoys dancing, has an event coming up 3/7   PLOF:  Independent  PATIENT GOALS: get back to dancing   OBJECTIVE:   DIAGNOSTIC FINDINGS:  IMPRESSION: (for left knee) 1. Oblique tear of the posterior horn of the medial meniscus extending to the inferior articular surface. 2. High-grade partial-thickness cartilage loss of the medial femorotibial compartment with areas of full-thickness cartilage loss of the weight-bearing surface of the medial femoral condyle.  PATIENT SURVEYS:  Eval: FOTO 46% goal 55% 08/25/21: FOTO 73%  SCREENING FOR RED FLAGS: Bowel or bladder incontinence: No Cauda equina syndrome: No   COGNITION: Overall cognitive status: Within functional limits for tasks assessed     SENSATION: Light touch: WFL   PALPATION: TTP right gluteal region and greater trochanter, left knee joint line and inferior patella No pain or hypomobility noted in lumbar spine with PAMs   LUMBAR ROM:   AROM eval 08/26/22  Flexion [facility]  WFL  Extension 75% WFL  Right lateral flexion [facility] WFL  Left lateral flexion [facility] WFL  Right rotation 50% WFL  Left rotation 50% WFL   (Blank rows = not tested)  LOWER EXTREMITY ROM:     Active  Right eval Left eval Left 08/17/22  Knee flexion 121 106 A:120 AAROM: 125  Knee extension 0 2    (Blank rows = not tested)  LOWER EXTREMITY MMT:    MMT tested in sitting Right eval Left eval Rt/Lt 08/26/22  Hip flexion 5 4+   Hip extension 4 4   Hip abduction 5 5   Knee flexion 5 4+   Knee extension 5 4    (Blank rows = not tested)  LUMBAR SPECIAL TESTS:  Eval: Slump test: Negative  FUNCTIONAL TESTS:  Eval: 5 times sit to stand: 16.41sec without UE support, decreased weight shift left and some associated knee pain with this  SLS around 20 sec each leg   08/26/22: 5 times sit to stand 17 seconds without UE support     TODAY'S TREATMENT:  10/07/22 TherEx Recumbent bike L5 X 10 min Blaze pods 3 pods: SLS 30 sec X 2 bilat, blaze pods 4 pods for sit to stand 30 sec X 1 turning off with hands,  then holding mini squat and turning off with hands 30 sec  X2 Leg press DL 829# 5A21 Seated Pball lumbar flexion roll outs 10 sec hold  X10 reps fwd Seated SLR 4# 2X10 bilat -Vasopnuematic device X 10 min, medium compression, 34 deg to Lt knee  09/29/22 TherEx Recumbent bike L4 X 10 min Leg press DL 308# 6V78, 46# 9G29 SL each side Single knee to chest 3x30 sec bil Piriformis stretch 3x30 sec bil Demonstrated use of tennis ball for self mobilization to low back if needed     PATIENT EDUCATION: Education details: HEP, PT plan of care  Person educated: Patient Education method: Explanation, Demonstration, Verbal cues, and Handouts Education comprehension: verbalized understanding and needs further education   HOME EXERCISE PROGRAM: Access Code: [email or web address] URL: [email or web address] Date: 07/14/2022 Prepared by: Ivery Quale  Exercises - Supine Bridge  - 2 x daily - 6 x weekly - 1-2 sets - 10 reps - 5 hold - Supine Straight Leg Raise  - 2 x daily - 6 x weekly - 3 sets - 10 reps - Sit to Stand Without Arm Support  - 2 x daily - 6 x weekly - 1-2 sets - 10 reps - Supine Hamstring Stretch with Strap  - 2 x daily - 6 x weekly - 1 sets - 3 reps - 30 hold - Seated Knee Flexion AAROM  - 2 x daily - 6 x weekly - 1 sets - 10 reps - 5 hold - Hip Extension with Resistance Loop  - 2 x daily - 6 x weekly - 1-2 sets - 10 reps - Standing Hip Abduction with Resistance at Ankles and Counter Support  - 2 x daily - 6 x weekly - 1-2 sets - 10 reps  ASSESSMENT:  CLINICAL IMPRESSION:  Despite having more pain upon arrival, he did do good with his strengthening program today focusing on quad strength. He has one more visit left scheduled and we will reassess.   OBJECTIVE IMPAIRMENTS: decreased activity tolerance, difficulty walking, decreased balance, decreased endurance, decreased mobility, decreased ROM, decreased strength, impaired flexibility, impaired LE use, postural dysfunction, and  pain.  ACTIVITY LIMITATIONS: bending, lifting, carry, locomotion, cleaning, community activity, driving, and dancing   PERSONAL FACTORS: PMH: diabetes, HTN, CAD, seizures, OA and medial meniscus tear of left knee are also affecting patient's functional outcome.  REHAB POTENTIAL: Good  CLINICAL DECISION MAKING: Evolving/moderate complexity  EVALUATION COMPLEXITY: Moderate    GOALS: Short term PT Goals Target date: 08/11/2022     Pt will be I and compliant with HEP. Baseline:  Goal status: MET 08/11/22 Pt will decrease pain by 25% overall Baseline: Goal status: MET 08/11/22   Long term PT goals Target date:10/28/2022     Pt will improve lumbar ROM to [facility] to improve functional mobility Baseline: Goal status: MET 08/26/22 Pt will improve  hip/knee strength to at least 5-/5 MMT to improve functional strength Baseline: Goal status: MET 08/26/22 Pt will improve FOTO to at least 55% functional to show improved function Baseline: Goal status: MET 08/26/22 Pt will reduce pain to overall less than 2-3/10 with usual activity and dancing Baseline: Goal status: mostly met Pt will reduce 5 times STS in under 13 sec to show improved function Baseline: Goal status: ongoing, currently takes 16 seconds  PLAN: PT FREQUENCY: 1-2 times per week   PT DURATION: 4-6 weeks  PLANNED INTERVENTIONS (unless contraindicated): aquatic PT, Canalith repositioning, cryotherapy, Electrical stimulation, Iontophoresis with 4 mg/ml dexamethasome, Moist heat, traction, Ultrasound, gait training, Therapeutic exercise, balance training, neuromuscular re-education, patient/family education, prosthetic training, manual techniques, passive ROM, dry needling, taping, vasopnuematic device, vestibular, spinal manipulations, joint manipulations  PLAN FOR NEXT SESSION: reassess progress and readiness to DC to HEP  Ivery Quale, PT, DPT 10/07/22 11:39 AM

## 2022-10-12 ENCOUNTER — Encounter: Payer: Self-pay | Admitting: Physical Therapy

## 2022-10-12 ENCOUNTER — Ambulatory Visit (INDEPENDENT_AMBULATORY_CARE_PROVIDER_SITE_OTHER): Payer: PPO | Admitting: Physical Therapy

## 2022-10-12 DIAGNOSIS — M6281 Muscle weakness (generalized): Secondary | ICD-10-CM | POA: Diagnosis not present

## 2022-10-12 DIAGNOSIS — M5459 Other low back pain: Secondary | ICD-10-CM | POA: Diagnosis not present

## 2022-10-12 DIAGNOSIS — M79605 Pain in left leg: Secondary | ICD-10-CM

## 2022-10-12 DIAGNOSIS — G8929 Other chronic pain: Secondary | ICD-10-CM

## 2022-10-12 DIAGNOSIS — M79604 Pain in right leg: Secondary | ICD-10-CM

## 2022-10-12 DIAGNOSIS — M25562 Pain in left knee: Secondary | ICD-10-CM

## 2022-10-12 NOTE — Therapy (Signed)
OUTPATIENT PHYSICAL THERAPY TREATMENT   Patient Name: Nicholas Rasmussen MRN: [id number] DOB:November 02, 1945, 77 y.o., male Today's Date: 10/12/2022  END OF SESSION:  PT End of Session - 10/12/22 1157     Visit Number 14    Number of Visits 15    Date for PT Re-Evaluation 10/28/22    Authorization Type [employer]    PT Start Time 1147    PT Stop Time 1225    PT Time Calculation (min) 38 min    Activity Tolerance Patient tolerated treatment well    Behavior During Therapy [facility] for tasks assessed/performed                Past Medical History:  Diagnosis Date   Allergic rhinitis 05/07/2014   Diabetes mellitus without complication (HCC)    Dizziness 01/02/2016   GERD (gastroesophageal reflux disease)    Headache 03/13/2015   Left flank pain 01/02/2016   Low back pain 06/06/2015   Pain of left side of body 08/13/2015   Post herpetic neuralgia    Routine general medical examination at a health care facility 05/07/2014   Seizure disorder (HCC) 05/07/2014   Seizures (HCC)    Last seizure in the 70s.   Shingles    Stye 05/07/2014   Past Surgical History:  Procedure Laterality Date   COLONOSCOPY     KNEE SURGERY Left    left miniscus   UPPER GASTROINTESTINAL ENDOSCOPY     uvala surgery     Patient Active Problem List   Diagnosis Date Noted   Renal mass, right; Bosniak 4 complex cyst; 1 cm 09/09/2022   Acute medial meniscus tear of left knee 02/04/2022   Primary osteoarthritis of left knee 02/04/2022   Aortic root dilatation (HCC) 12/18/2021   Slow transit constipation 12/08/2021   Chest wall muscle strain 12/08/2021   Multiple pulmonary nodules 10/06/2021   Hyperlipidemia associated with type 2 diabetes mellitus (HCC) 08/19/2021   [name] 04/07/2021   Complex renal cyst 04/07/2021   Varicose veins of both lower extremities with pain 02/02/2019   Encounter for therapeutic drug level monitoring 09/19/2018   Type 2  diabetes mellitus with diabetic polyneuropathy, without long-term current use of insulin (HCC) 03/29/2018   Diabetic neuropathy (HCC) 03/29/2018   Chronic pain of left knee 03/23/2018   Coronary artery disease involving native coronary artery of native heart with angina pectoris (HCC) 08/03/2017   Post herpetic neuralgia 07/29/2016   Dizziness 01/02/2016   Chronic midline low back pain without sciatica 06/06/2015   Nonintractable episodic headache 03/13/2015   GERD (gastroesophageal reflux disease) 11/15/2014   Seizure disorder (HCC) 05/07/2014   Allergic rhinitis 05/07/2014    PCP: Anne Ng, NP  REFERRING PROVIDER: Anne Ng, NP  REFERRING DIAG: M54.50,G89.29 (ICD-10-CM) - Chronic midline low back pain without sciatica M25.562,G89.29 (ICD-10-CM) - Chronic pain of left knee  Rationale for Evaluation and Treatment: Rehabilitation  THERAPY DIAG:  No diagnosis found.  ONSET DATE: a few weeks ago  SUBJECTIVE:  SUBJECTIVE STATEMENT: He relays the pain is in his left knee cap, he met with MD who is recommending partial TKA that he wants to have done in October.   PERTINENT HISTORY:  PMH: diabetes, HTN, CAD, seizures, OA and medial meniscus tear of left knee  PAIN:  Are you having pain? 7/10 in back and knee.   PRECAUTIONS: None  WEIGHT BEARING RESTRICTIONS: No  FALLS:  Has patient fallen in last 6 months? No  LIVING ENVIRONMENT: Lives in: House/apartment Stairs: 1 flight on stairs, said he goes up and down sideways  OCCUPATION: retired -- enjoys dancing, has an event coming up 3/7   PLOF: Independent  PATIENT GOALS: get back to dancing   OBJECTIVE:   DIAGNOSTIC FINDINGS:  IMPRESSION: (for left knee) 1. Oblique tear of the posterior horn of the medial  meniscus extending to the inferior articular surface. 2. High-grade partial-thickness cartilage loss of the medial femorotibial compartment with areas of full-thickness cartilage loss of the weight-bearing surface of the medial femoral condyle.  PATIENT SURVEYS:  Eval: FOTO 46% goal 55% 08/25/21: FOTO 73%  SCREENING FOR RED FLAGS: Bowel or bladder incontinence: No Cauda equina syndrome: No   COGNITION: Overall cognitive status: Within functional limits for tasks assessed     SENSATION: Light touch: WFL   PALPATION: TTP right gluteal region and greater trochanter, left knee joint line and inferior patella No pain or hypomobility noted in lumbar spine with PAMs   LUMBAR ROM:   AROM eval 08/26/22  Flexion [facility]  WFL  Extension 75% WFL  Right lateral flexion [facility] WFL  Left lateral flexion [facility] WFL  Right rotation 50% WFL  Left rotation 50% WFL   (Blank rows = not tested)  LOWER EXTREMITY ROM:     Active  Right eval Left eval Left 08/17/22  Knee flexion 121 106 A:120 AAROM: 125  Knee extension 0 2    (Blank rows = not tested)  LOWER EXTREMITY MMT:    MMT tested in sitting Right eval Left eval Rt/Lt 08/26/22  Hip flexion 5 4+   Hip extension 4 4   Hip abduction 5 5   Knee flexion 5 4+   Knee extension 5 4    (Blank rows = not tested)  LUMBAR SPECIAL TESTS:  Eval: Slump test: Negative  FUNCTIONAL TESTS:  Eval: 5 times sit to stand: 16.41sec without UE support, decreased weight shift left and some associated knee pain with this  SLS around 20 sec each leg   08/26/22: 5 times sit to stand 17 seconds without UE support     TODAY'S TREATMENT:  10/12/22 TherEx Recumbent bike L5 X 10 min Step ups 6 inch step X 15 fwd bilat Step ups 6 inch step X 10 lateral bilat Sit to stand with 10# X 10, slow eccentric lower Deadlift from 8 inch to waist 10# X 10 Seated Pball lumbar flexion roll outs 10 sec hold  X10 reps fwd Seated SLR 4# 2X10 bilat Seated LAQ 4# X 15  bilat Standing hamstring curls X 15 bilat 4# Standing hip abd X 15 bilat 4# -Vasopnuematic device X 10 min, medium compression, 34 deg to Lt knee  10/07/22 TherEx Recumbent bike L5 X 10 min Blaze pods 3 pods: SLS 30 sec X 2 bilat, blaze pods 4 pods for sit to stand 30 sec X 1 turning off with hands, then holding mini squat and turning off with hands 30 sec  X2 Leg press DL 409# [id number] Seated Pball lumbar flexion  roll outs 10 sec hold  X10 reps fwd Seated SLR 4# 2X10 bilat -Vasopnuematic device X 10 min, medium compression, 34 deg to Lt knee  09/29/22 TherEx Recumbent bike L4 X 10 min Leg press DL 604# [id number], 81# 1B14 SL each side Single knee to chest 3x30 sec bil Piriformis stretch 3x30 sec bil Demonstrated use of tennis ball for self mobilization to low back if needed     PATIENT EDUCATION: Education details: HEP, PT plan of care Person educated: Patient Education method: Explanation, Demonstration, Verbal cues, and Handouts Education comprehension: verbalized understanding and needs further education   HOME EXERCISE PROGRAM: Access Code: [email or web address] URL: [email or web address] Date: 07/14/2022 Prepared by: Ivery Quale  Exercises - Supine Bridge  - 2 x daily - 6 x weekly - 1-2 sets - 10 reps - 5 hold - Supine Straight Leg Raise  - 2 x daily - 6 x weekly - 3 sets - 10 reps - Sit to Stand Without Arm Support  - 2 x daily - 6 x weekly - 1-2 sets - 10 reps - Supine Hamstring Stretch with Strap  - 2 x daily - 6 x weekly - 1 sets - 3 reps - 30 hold - Seated Knee Flexion AAROM  - 2 x daily - 6 x weekly - 1 sets - 10 reps - 5 hold - Hip Extension with Resistance Loop  - 2 x daily - 6 x weekly - 1-2 sets - 10 reps - Standing Hip Abduction with Resistance at Ankles and Counter Support  - 2 x daily - 6 x weekly - 1-2 sets - 10 reps  ASSESSMENT:  CLINICAL IMPRESSION:  We will plan to transition to independent program next visit. He will end up needing partial knee  replacement and PT recommends he continues to work on strengthening in the mean time. He plans to get ankle weights and I showed him additional exercises he can do with these at home.   OBJECTIVE IMPAIRMENTS: decreased activity tolerance, difficulty walking, decreased balance, decreased endurance, decreased mobility, decreased ROM, decreased strength, impaired flexibility, impaired LE use, postural dysfunction, and pain.  ACTIVITY LIMITATIONS: bending, lifting, carry, locomotion, cleaning, community activity, driving, and dancing   PERSONAL FACTORS: PMH: diabetes, HTN, CAD, seizures, OA and medial meniscus tear of left knee are also affecting patient's functional outcome.  REHAB POTENTIAL: Good  CLINICAL DECISION MAKING: Evolving/moderate complexity  EVALUATION COMPLEXITY: Moderate    GOALS: Short term PT Goals Target date: 08/11/2022     Pt will be I and compliant with HEP. Baseline:  Goal status: MET 08/11/22 Pt will decrease pain by 25% overall Baseline: Goal status: MET 08/11/22   Long term PT goals Target date:10/28/2022     Pt will improve lumbar ROM to [facility] to improve functional mobility Baseline: Goal status: MET 08/26/22 Pt will improve  hip/knee strength to at least 5-/5 MMT to improve functional strength Baseline: Goal status: MET 08/26/22 Pt will improve FOTO to at least 55% functional to show improved function Baseline: Goal status: MET 08/26/22 Pt will reduce pain to overall less than 2-3/10 with usual activity and dancing Baseline: Goal status: mostly met Pt will reduce 5 times STS in under 13 sec to show improved function Baseline: Goal status: ongoing, currently takes 16 seconds  PLAN: PT FREQUENCY: 1-2 times per week   PT DURATION: 4-6 weeks  PLANNED INTERVENTIONS (unless contraindicated): aquatic PT, Canalith repositioning, cryotherapy, Electrical stimulation, Iontophoresis with 4 mg/ml dexamethasome, Moist heat, traction, Ultrasound, gait training,  Therapeutic exercise,  balance training, neuromuscular re-education, patient/family education, prosthetic training, manual techniques, passive ROM, dry needling, taping, vasopnuematic device, vestibular, spinal manipulations, joint manipulations  PLAN FOR NEXT SESSION: DC to HEP  Ivery Quale, PT, DPT 10/12/22 11:57 AM

## 2022-10-14 DIAGNOSIS — D49511 Neoplasm of unspecified behavior of right kidney: Secondary | ICD-10-CM | POA: Diagnosis not present

## 2022-10-27 ENCOUNTER — Encounter: Payer: Self-pay | Admitting: Physical Therapy

## 2022-10-27 ENCOUNTER — Ambulatory Visit: Payer: PPO | Admitting: Physical Therapy

## 2022-10-27 DIAGNOSIS — G8929 Other chronic pain: Secondary | ICD-10-CM

## 2022-10-27 DIAGNOSIS — M79604 Pain in right leg: Secondary | ICD-10-CM

## 2022-10-27 DIAGNOSIS — M5459 Other low back pain: Secondary | ICD-10-CM | POA: Diagnosis not present

## 2022-10-27 DIAGNOSIS — M79605 Pain in left leg: Secondary | ICD-10-CM

## 2022-10-27 DIAGNOSIS — M25562 Pain in left knee: Secondary | ICD-10-CM

## 2022-10-27 DIAGNOSIS — M6281 Muscle weakness (generalized): Secondary | ICD-10-CM | POA: Diagnosis not present

## 2022-10-27 NOTE — Therapy (Signed)
OUTPATIENT PHYSICAL THERAPY TREATMENT/Discharge PHYSICAL THERAPY DISCHARGE SUMMARY  Visits from Start of Care:   Current functional level related to goals / functional outcomes: See below   Remaining deficits: See below   Education / Equipment: HEP  Plan:  Patient goals were all but one met. Patient is being discharged due to now needing knee replacement, he will keep up with his PT program in the meantime.       Patient Name: Nicholas Rasmussen MRN: [id number] DOB:[birth year], 77 y.o., male Today's Date: 10/27/2022  END OF SESSION:  PT End of Session - 10/27/22 1309     Visit Number 15 (P)     Number of Visits 15 (P)     Date for PT Re-Evaluation 10/28/22 (P)     Authorization Type [employer] (P)     PT Start Time 1303 (P)     PT Stop Time 1343 (P)     PT Time Calculation (min) 40 min (P)     Activity Tolerance Patient tolerated treatment well (P)     Behavior During Therapy WFL for tasks assessed/performed (P)                  Past Medical History:  Diagnosis Date   Allergic rhinitis 05/07/2014   Diabetes mellitus without complication (HCC)    Dizziness 01/02/2016   GERD (gastroesophageal reflux disease)    Headache 03/13/2015   Left flank pain 01/02/2016   Low back pain 06/06/2015   Pain of left side of body 08/13/2015   Post herpetic neuralgia    Routine general medical examination at a health care facility 05/07/2014   Seizure disorder (HCC) 05/07/2014   Seizures (HCC)    Last seizure in the 70s.   Shingles    Stye 05/07/2014   Past Surgical History:  Procedure Laterality Date   COLONOSCOPY     KNEE SURGERY Left    left miniscus   UPPER GASTROINTESTINAL ENDOSCOPY     uvala surgery     Patient Active Problem List   Diagnosis Date Noted   Renal mass, right; Bosniak 4 complex cyst; 1 cm 09/09/2022   Acute medial meniscus tear of left knee 02/04/2022   Primary osteoarthritis of left knee 02/04/2022   Aortic root dilatation (HCC)  12/18/2021   Slow transit constipation 12/08/2021   Chest wall muscle strain 12/08/2021   Multiple pulmonary nodules 10/06/2021   Hyperlipidemia associated with type 2 diabetes mellitus (HCC) 08/19/2021   [name] 04/07/2021   Complex renal cyst 04/07/2021   Varicose veins of both lower extremities with pain 02/02/2019   Encounter for therapeutic drug level monitoring 09/19/2018   Type 2 diabetes mellitus with diabetic polyneuropathy, without long-term current use of insulin (HCC) 03/29/2018   Diabetic neuropathy (HCC) 03/29/2018   Chronic pain of left knee 03/23/2018   Coronary artery disease involving native coronary artery of native heart with angina pectoris (HCC) 08/03/2017   Post herpetic neuralgia 07/29/2016   Dizziness 01/02/2016   Chronic midline low back pain without sciatica 06/06/2015   Nonintractable episodic headache 03/13/2015   GERD (gastroesophageal reflux disease) 11/15/2014   Seizure disorder (HCC) 05/07/2014   Allergic rhinitis 05/07/2014    PCP: Anne Ng, NP  REFERRING PROVIDER: Anne Ng, NP  REFERRING DIAG: M54.50,G89.29 (ICD-10-CM) - Chronic midline low back pain without sciatica M25.562,G89.29 (ICD-10-CM) - Chronic pain of left knee  Rationale for Evaluation and Treatment: Rehabilitation  THERAPY DIAG:  Other low back pain  Chronic pain of left knee  Pain in right leg  Pain in left leg  Muscle weakness (generalized)  ONSET DATE: a few weeks ago  SUBJECTIVE:                                                                                                                                                                                           SUBJECTIVE STATEMENT: He relays he will have Left knee replacement in October. He agrees to transition to independent program in the meantime. He did get 5# ankle weights for home as well.   PERTINENT HISTORY:  PMH: diabetes, HTN, CAD, seizures,  OA and medial meniscus tear of left knee  PAIN:  Are you having pain? 7/10 in back and knee.   PRECAUTIONS: None  WEIGHT BEARING RESTRICTIONS: No  FALLS:  Has patient fallen in last 6 months? No  LIVING ENVIRONMENT: Lives in: House/apartment Stairs: 1 flight on stairs, said he goes up and down sideways  OCCUPATION: retired -- enjoys dancing, has an event coming up 3/7   PLOF: Independent  PATIENT GOALS: get back to dancing   OBJECTIVE:   DIAGNOSTIC FINDINGS:  IMPRESSION: (for left knee) 1. Oblique tear of the posterior horn of the medial meniscus extending to the inferior articular surface. 2. High-grade partial-thickness cartilage loss of the medial femorotibial compartment with areas of full-thickness cartilage loss of the weight-bearing surface of the medial femoral condyle.  PATIENT SURVEYS:  Eval: FOTO 46% goal 55% 08/25/21: FOTO 73%  SCREENING FOR RED FLAGS: Bowel or bladder incontinence: No Cauda equina syndrome: No   COGNITION: Overall cognitive status: Within functional limits for tasks assessed     SENSATION: Light touch: WFL   PALPATION: TTP right gluteal region and greater trochanter, left knee joint line and inferior patella No pain or hypomobility noted in lumbar spine with PAMs   LUMBAR ROM:   AROM eval 08/26/22  Flexion [facility]  WFL  Extension 75% WFL  Right lateral flexion [facility] WFL  Left lateral flexion [facility] WFL  Right rotation 50% WFL  Left rotation 50% WFL   (Blank rows = not tested)  LOWER EXTREMITY ROM:     Active  Right eval Left eval Left 08/17/22  Knee flexion 121 106 A:120 AAROM: 125  Knee extension 0 2    (Blank rows = not tested)  LOWER EXTREMITY MMT:    MMT tested in sitting Right eval Left eval Rt/Lt 10/27/22  Hip flexion 5 4+ 5/5  Hip extension 4 4 5/5  Hip abduction 5 5 5/5  Knee flexion 5 4+ 5/5  Knee extension 5 4 5/5-   (Blank rows =  not tested)  LUMBAR SPECIAL TESTS:  Eval: Slump test:  Negative  FUNCTIONAL TESTS:  Eval: 5 times sit to stand: 16.41sec without UE support, decreased weight shift left and some associated knee pain with this  SLS around 20 sec each leg   08/26/22: 5 times sit to stand 17 seconds without UE support  10/27/22: 5 times sit to stand 14.4 sec     TODAY'S TREATMENT:  10/27/22 TherEx Recumbent bike L5 X 10 min Sit to stand with 10# 2 X 10, slow eccentric lower 5 times sit to stand test, see above Seated SLR 5# 2X10 bilat Seated LAQ 5# X 2X10bilat Standing hamstring curls X 15 bilat 5# Standing hip abd X 15 bilat 5# Standing hip extension X 15 bilat 5# Standing lumbar extensions X 10 Supine single knee to chest stretch 30 sec X 2 bilat Supine low trunk rotations 10 sec X 5 bilat Supine bridges X 10   PATIENT EDUCATION: Education details: HEP, PT plan of care Person educated: Patient Education method: Explanation, Demonstration, Verbal cues, and Handouts Education comprehension: verbalized understanding and needs further education   HOME EXERCISE PROGRAM: Access Code: [email or web address] URL: [email or web address] Date: 07/14/2022 Prepared by: Ivery Quale  Exercises - Supine Bridge  - 2 x daily - 6 x weekly - 1-2 sets - 10 reps - 5 hold - Supine Straight Leg Raise  - 2 x daily - 6 x weekly - 3 sets - 10 reps - Sit to Stand Without Arm Support  - 2 x daily - 6 x weekly - 1-2 sets - 10 reps - Supine Hamstring Stretch with Strap  - 2 x daily - 6 x weekly - 1 sets - 3 reps - 30 hold - Seated Knee Flexion AAROM  - 2 x daily - 6 x weekly - 1 sets - 10 reps - 5 hold - Hip Extension with Resistance Loop  - 2 x daily - 6 x weekly - 1-2 sets - 10 reps - Standing Hip Abduction with Resistance at Ankles and Counter Support  - 2 x daily - 6 x weekly - 1-2 sets - 10 reps  ASSESSMENT:  CLINICAL IMPRESSION:  We will transition to independent program which was updated today to include exercises he can do with his ankle weights he now has. He  will keep up with these until he has his knee replacement in October. Overall he made good progress with PT and he has met all but one PT goal and was very close to meeting it.    OBJECTIVE IMPAIRMENTS: decreased activity tolerance, difficulty walking, decreased balance, decreased endurance, decreased mobility, decreased ROM, decreased strength, impaired flexibility, impaired LE use, postural dysfunction, and pain.  ACTIVITY LIMITATIONS: bending, lifting, carry, locomotion, cleaning, community activity, driving, and dancing   PERSONAL FACTORS: PMH: diabetes, HTN, CAD, seizures, OA and medial meniscus tear of left knee are also affecting patient's functional outcome.  REHAB POTENTIAL: Good  CLINICAL DECISION MAKING: Evolving/moderate complexity  EVALUATION COMPLEXITY: Moderate    GOALS: Short term PT Goals Target date: 08/11/2022     Pt will be I and compliant with HEP. Baseline:  Goal status: MET 08/11/22 Pt will decrease pain by 25% overall Baseline: Goal status: MET 08/11/22   Long term PT goals Target date:10/28/2022     Pt will improve lumbar ROM to [facility] to improve functional mobility Baseline: Goal status: MET 08/26/22 Pt will improve  hip/knee strength to at least 5-/5 MMT to improve functional strength Baseline: Goal status: MET 08/26/22  Pt will improve FOTO to at least 55% functional to show improved function Baseline: Goal status: MET 08/26/22 Pt will reduce pain to overall less than 2-3/10 with usual activity and dancing Baseline: Goal status: mostly met Pt will reduce 5 times STS in under 13 sec to show improved function Baseline: Goal status: ongoing, currently takes 14.5 seconds  PLAN: PT FREQUENCY: 1-2 times per week   PT DURATION: 4-6 weeks  PLANNED INTERVENTIONS (unless contraindicated): aquatic PT, Canalith repositioning, cryotherapy, Electrical stimulation, Iontophoresis with 4 mg/ml dexamethasome, Moist heat, traction, Ultrasound, gait training, Therapeutic  exercise, balance training, neuromuscular re-education, patient/family education, prosthetic training, manual techniques, passive ROM, dry needling, taping, vasopnuematic device, vestibular, spinal manipulations, joint manipulations  PLAN FOR NEXT SESSION: DC to HEP today  Ivery Quale, PT, DPT 10/27/22 1:10 PM

## 2022-11-01 ENCOUNTER — Other Ambulatory Visit: Payer: Self-pay | Admitting: Nurse Practitioner

## 2022-11-01 ENCOUNTER — Encounter: Payer: Self-pay | Admitting: Nurse Practitioner

## 2022-11-01 ENCOUNTER — Ambulatory Visit (INDEPENDENT_AMBULATORY_CARE_PROVIDER_SITE_OTHER): Payer: PPO | Admitting: Nurse Practitioner

## 2022-11-01 VITALS — BP 118/74 | HR 47 | Temp 98.4°F | Resp 16 | Ht 76.0 in | Wt 247.2 lb

## 2022-11-01 DIAGNOSIS — G8929 Other chronic pain: Secondary | ICD-10-CM | POA: Diagnosis not present

## 2022-11-01 DIAGNOSIS — Z7985 Long-term (current) use of injectable non-insulin antidiabetic drugs: Secondary | ICD-10-CM

## 2022-11-01 DIAGNOSIS — R0789 Other chest pain: Secondary | ICD-10-CM

## 2022-11-01 DIAGNOSIS — M546 Pain in thoracic spine: Secondary | ICD-10-CM

## 2022-11-01 DIAGNOSIS — E1142 Type 2 diabetes mellitus with diabetic polyneuropathy: Secondary | ICD-10-CM | POA: Diagnosis not present

## 2022-11-01 DIAGNOSIS — N281 Cyst of kidney, acquired: Secondary | ICD-10-CM

## 2022-11-01 DIAGNOSIS — K5901 Slow transit constipation: Secondary | ICD-10-CM | POA: Diagnosis not present

## 2022-11-01 DIAGNOSIS — L210 Seborrhea capitis: Secondary | ICD-10-CM

## 2022-11-01 DIAGNOSIS — R21 Rash and other nonspecific skin eruption: Secondary | ICD-10-CM | POA: Diagnosis not present

## 2022-11-01 LAB — POCT GLYCOSYLATED HEMOGLOBIN (HGB A1C): Hemoglobin A1C: 5.8 % — AB (ref 4.0–5.6)

## 2022-11-01 MED ORDER — ACYCLOVIR 200 MG PO CAPS
200.0000 mg | ORAL_CAPSULE | Freq: Every day | ORAL | 0 refills | Status: DC
Start: 2022-11-01 — End: 2023-02-09

## 2022-11-01 MED ORDER — ACYCLOVIR 5 % EX CREA
1.0000 | TOPICAL_CREAM | Freq: Four times a day (QID) | CUTANEOUS | 3 refills | Status: DC
Start: 2022-11-01 — End: 2023-03-01

## 2022-11-01 MED ORDER — KETOCONAZOLE 2 % EX SHAM
1.0000 | MEDICATED_SHAMPOO | CUTANEOUS | 0 refills | Status: DC
Start: 2022-11-01 — End: 2022-12-01

## 2022-11-01 NOTE — Assessment & Plan Note (Addendum)
Onset Secondary to poor posture. Admits to leaning to left side mostly when sitting on sofa or lazy boy chair. No SOB, no rash, no nodule, no paresthesia. Dg chest 05/2022: normal CT chest 10/2021: no acute finding.  Advised about importance of Work on maintain current posture at all times.

## 2022-11-01 NOTE — Patient Instructions (Addendum)
Work on maintain current posture at all times. This will help improve left chest wall discomfort. Use colace 100mg  1-2x/day as needed for constipation. Maintain adequate oral hydration and high fiber diet. Maintain upcoming appointment with me.  hgbA1c at 5.8%: improved

## 2022-11-01 NOTE — Progress Notes (Signed)
Established Patient Visit  Patient: Nicholas Rasmussen   DOB: 1946-05-10   77 y.o. Male  MRN: [id number] Visit Date: 11/01/2022  Subjective:    Chief Complaint  Patient presents with   Abdominal Pain    LLQ quadrant pain.    Chest Pain    Pain under left breast    Slow transit constipation Associated with intermittent left side ABDOMEN colicky pain. This improves with BM. Advised to use colace 100mg  1-2x/day as needed for constipation. Maintain adequate oral hydration and high fiber diet.  Type 2 diabetes mellitus with diabetic polyneuropathy, without long-term current use of insulin (HCC) Jardiance switched to glipizide due to high copay and yeast infection Repeat hgbA1c: 5.8% Improved Maintain glipizide dose  Left-sided chest wall pain Onset Secondary to poor posture. Admits to leaning to left side mostly when sitting on sofa or lazy boy chair. No SOB, no rash, no nodule, no paresthesia. Dg chest 05/2022: normal CT chest 10/2021: no acute finding.  Advised about importance of Work on maintain current posture at all times.  Complex renal cyst Under the care of urology Plan to repeat MRI ABDOMEN 01/2023  Wt Readings from Last 3 Encounters:  11/01/22 247 lb 3.2 oz (112.1 kg)  09/09/22 248 lb (112.5 kg)  06/25/22 255 lb (115.7 kg)    Reviewed medical, surgical, and social history today  Medications: Outpatient Medications Prior to Visit  Medication Sig   acetaminophen (TYLENOL) 500 MG tablet Take 1 tablet (500 mg total) by mouth every 8 (eight) hours as needed.   atorvastatin (LIPITOR) 20 MG tablet TAKE 1 TABLET BY MOUTH DAILY AT 6 PM.   cetirizine (ZYRTEC) 10 MG tablet TAKE 1 TABLET BY MOUTH EVERYDAY AT BEDTIME   clotrimazole (LOTRIMIN) 1 % cream APPLY TO AFFECTED AREA TWICE A DAY   diclofenac Sodium (VOLTAREN) 1 % GEL Apply a small grape sized dollop to sore joint up to every 6 hours as needed   divalproex (DEPAKOTE ER) 500 MG 24 hr tablet Take 1 tablet  (500 mg total) by mouth at bedtime.   docusate sodium (COLACE) 100 MG capsule TAKE 1 CAPSULE BY MOUTH TWICE A DAY   gabapentin (NEURONTIN) 300 MG capsule TAKE 1 CAPSULE BY MOUTH EVERYDAY AT BEDTIME   glipiZIDE (GLUCOTROL XL) 5 MG 24 hr tablet TAKE 1 TABLET BY MOUTH EVERY DAY WITH BREAKFAST   ibuprofen (ADVIL) 800 MG tablet Take 1 tablet (800 mg total) by mouth 3 (three) times daily. (Patient taking differently: Take 800 mg by mouth as needed.)   metoprolol succinate (TOPROL-XL) 25 MG 24 hr tablet TAKE 1 TABLET (25 MG TOTAL) BY MOUTH DAILY.   omeprazole (PRILOSEC) 20 MG capsule TAKE 1 CAPSULE BY MOUTH EVERY DAY AS NEEDED   [DISCONTINUED] acyclovir (ZOVIRAX) 200 MG capsule Take 1 capsule (200 mg total) by mouth 5 (five) times daily. (Patient taking differently: Take 200 mg by mouth daily as needed.)   [DISCONTINUED] acyclovir cream (ZOVIRAX) 5 % APPLY 1 APPLICATION TOPICALLY EVERY 6 (SIX) HOURS.   No facility-administered medications prior to visit.   Reviewed past medical and social history.   ROS per HPI above      Objective:  BP 118/74 (BP Location: Left Arm, Patient Position: Sitting, Cuff Size: Large)   Pulse (!) 47   Temp 98.4 F (36.9 C) (Temporal)   Resp 16   Ht 6\' 4"  (1.93 m)   Wt 247 lb 3.2 oz (  112.1 kg)   SpO2 96%   BMI 30.09 kg/m      Physical Exam Vitals and nursing note reviewed.  Cardiovascular:     Rate and Rhythm: Normal rate and regular rhythm.     Pulses: Normal pulses.     Heart sounds: Normal heart sounds.  Pulmonary:     Effort: Pulmonary effort is normal.     Breath sounds: Normal breath sounds.  Chest:     Chest wall: No tenderness.  Abdominal:     General: Bowel sounds are normal.     Tenderness: There is no abdominal tenderness. There is no right CVA tenderness, left CVA tenderness or guarding.  Skin:    General: Skin is warm and dry.     Findings: No rash.  Neurological:     Mental Status: He is alert and oriented to person, place, and time.      Results for orders placed or performed in visit on 11/01/22  POCT glycosylated hemoglobin (Hb A1C)  Result Value Ref Range   Hemoglobin A1C 5.8 (A) 4.0 - 5.6 %   HbA1c POC (<> result, manual entry)     HbA1c, POC (prediabetic range)     HbA1c, POC (controlled diabetic range)        Assessment & Plan:    Problem List Items Addressed This Visit       Digestive   Slow transit constipation    Associated with intermittent left side ABDOMEN colicky pain. This improves with BM. Advised to use colace 100mg  1-2x/day as needed for constipation. Maintain adequate oral hydration and high fiber diet.        Endocrine   Type 2 diabetes mellitus with diabetic polyneuropathy, without long-term current use of insulin (HCC)    Jardiance switched to glipizide due to high copay and yeast infection Repeat hgbA1c: 5.8% Improved Maintain glipizide dose      Relevant Orders   POCT glycosylated hemoglobin (Hb A1C) (Completed)     Genitourinary   Complex renal cyst    Under the care of urology Plan to repeat MRI ABDOMEN 01/2023        Other   Left-sided chest wall pain    Onset Secondary to poor posture. Admits to leaning to left side mostly when sitting on sofa or lazy boy chair. No SOB, no rash, no nodule, no paresthesia. Dg chest 05/2022: normal CT chest 10/2021: no acute finding.  Advised about importance of Work on maintain current posture at all times.      Other Visit Diagnoses     Chronic left-sided thoracic back pain    -  Primary   Penile rash       Relevant Medications   acyclovir (ZOVIRAX) 200 MG capsule   acyclovir cream (ZOVIRAX) 5 %   Dandruff       Relevant Medications   ketoconazole (NIZORAL) 2 % shampoo      Return for maintain upcoming appt in August.     Alysia Penna, NP

## 2022-11-01 NOTE — Assessment & Plan Note (Signed)
Associated with intermittent left side ABDOMEN colicky pain. This improves with BM. Advised to use colace 100mg  1-2x/day as needed for constipation. Maintain adequate oral hydration and high fiber diet.

## 2022-11-01 NOTE — Assessment & Plan Note (Addendum)
Jardiance switched to glipizide due to high copay and yeast infection Repeat hgbA1c: 5.8% Improved Maintain glipizide dose

## 2022-11-01 NOTE — Assessment & Plan Note (Signed)
Under the care of urology Plan to repeat MRI ABDOMEN 01/2023

## 2022-11-05 DIAGNOSIS — E119 Type 2 diabetes mellitus without complications: Secondary | ICD-10-CM | POA: Diagnosis not present

## 2022-11-09 ENCOUNTER — Ambulatory Visit (INDEPENDENT_AMBULATORY_CARE_PROVIDER_SITE_OTHER): Payer: PPO | Admitting: Orthopaedic Surgery

## 2022-11-09 DIAGNOSIS — M1712 Unilateral primary osteoarthritis, left knee: Secondary | ICD-10-CM

## 2022-11-09 MED ORDER — LIDOCAINE HCL 1 % IJ SOLN
2.0000 mL | INTRAMUSCULAR | Status: AC | PRN
Start: 2022-11-09 — End: 2022-11-09
  Administered 2022-11-09: 2 mL

## 2022-11-09 MED ORDER — BUPIVACAINE HCL 0.5 % IJ SOLN
2.0000 mL | INTRAMUSCULAR | Status: AC | PRN
Start: 2022-11-09 — End: 2022-11-09
  Administered 2022-11-09: 2 mL via INTRA_ARTICULAR

## 2022-11-09 MED ORDER — METHYLPREDNISOLONE ACETATE 40 MG/ML IJ SUSP
40.0000 mg | INTRAMUSCULAR | Status: AC | PRN
Start: 2022-11-09 — End: 2022-11-09
  Administered 2022-11-09: 40 mg via INTRA_ARTICULAR

## 2022-11-09 NOTE — Progress Notes (Signed)
Office Visit Note   Patient: Nicholas Rasmussen           Date of Birth: January 24, 1946           MRN: [id number] Visit Date: 11/09/2022              Requested by: Anne Ng, NP [address] [place],  [place] [address] PCP: Anne Ng, NP   Assessment & Plan: Visit Diagnoses:  1. Primary osteoarthritis of left knee     Plan: Left knee was injected with cortisone today.  He tolerated this well.  We look forward to treating him in the operating theater in October.  Follow-Up Instructions: No follow-ups on file.   Orders:  No orders of the defined types were placed in this encounter.  No orders of the defined types were placed in this encounter.     Procedures: Large Joint Inj: L knee on 11/09/2022 11:22 AM Details: 22 G needle Medications: 2 mL bupivacaine 0.5 %; 2 mL lidocaine 1 %; 40 mg methylPREDNISolone acetate 40 MG/ML Outcome: tolerated well, no immediate complications Patient was prepped and draped in the usual sterile fashion.       Clinical Data: No additional findings.   Subjective: Chief Complaint  Patient presents with   Left Knee - Pain    HPI [name] comes in today for left knee cortisone injection.  He is scheduled for total knee replacement in October.  He would like to try an injection into get him through the summer. Review of Systems   Objective: Vital Signs: There were no vitals taken for this visit.  Physical Exam  Ortho Exam Examination left knee is unchanged. Specialty Comments:  No specialty comments available.  Imaging: No results found.   PMFS History: Patient Active Problem List   Diagnosis Date Noted   Renal mass, right; Bosniak 4 complex cyst; 1 cm 09/09/2022   Vasomotor rhinitis 03/03/2022   Acute medial meniscus tear of left knee 02/04/2022   Primary osteoarthritis of left knee 02/04/2022   Aortic root dilatation (HCC) 12/18/2021   Slow transit constipation 12/08/2021   Left-sided chest wall  pain 12/08/2021   Multiple pulmonary nodules 10/06/2021   Hyperlipidemia associated with type 2 diabetes mellitus (HCC) 08/19/2021   [name] 04/07/2021   Complex renal cyst 04/07/2021   Varicose veins of both lower extremities with pain 02/02/2019   Encounter for therapeutic drug level monitoring 09/19/2018   Type 2 diabetes mellitus with diabetic polyneuropathy, without long-term current use of insulin (HCC) 03/29/2018   Diabetic neuropathy (HCC) 03/29/2018   Chronic pain of left knee 03/23/2018   Coronary artery disease involving native coronary artery of native heart with angina pectoris (HCC) 08/03/2017   Post herpetic neuralgia 07/29/2016   Dizziness 01/02/2016   Chronic midline low back pain without sciatica 06/06/2015   Nonintractable episodic headache 03/13/2015   GERD (gastroesophageal reflux disease) 11/15/2014   Seizure disorder (HCC) 05/07/2014   Allergic rhinitis 05/07/2014   Past Medical History:  Diagnosis Date   Allergic rhinitis 05/07/2014   Diabetes mellitus without complication (HCC)    Dizziness 01/02/2016   GERD (gastroesophageal reflux disease)    Headache 03/13/2015   Left flank pain 01/02/2016   Low back pain 06/06/2015   Pain of left side of body 08/13/2015   Post herpetic neuralgia    Routine general medical examination at a health care facility 05/07/2014   Seizure disorder (HCC) 05/07/2014   Seizures (HCC)  Last seizure in the 70s.   Shingles    Stye 05/07/2014    Family History  Problem Relation Age of Onset   Other Mother        natural causes   Diabetes Father    Hypertension Father    Kidney cancer Brother    Colon cancer Neg Hx    Stomach cancer Neg Hx    Esophageal cancer Neg Hx    Colon polyps Neg Hx     Past Surgical History:  Procedure Laterality Date   COLONOSCOPY     KNEE SURGERY Left    left miniscus   UPPER GASTROINTESTINAL ENDOSCOPY     uvala surgery     Social History    Occupational History   Occupation: Retired    Comment: Printmaker  Tobacco Use   Smoking status: Former   Smokeless tobacco: Never  Building services engineer Use: Never used  Substance and Sexual Activity   Alcohol use: Yes    Alcohol/week: 0.0 standard drinks of alcohol    Comment: occasional   Drug use: No   Sexual activity: Not on file

## 2022-11-26 DIAGNOSIS — E119 Type 2 diabetes mellitus without complications: Secondary | ICD-10-CM | POA: Diagnosis not present

## 2022-11-28 ENCOUNTER — Other Ambulatory Visit: Payer: Self-pay | Admitting: Nurse Practitioner

## 2022-11-28 DIAGNOSIS — L210 Seborrhea capitis: Secondary | ICD-10-CM

## 2022-12-02 ENCOUNTER — Encounter: Payer: Self-pay | Admitting: Nurse Practitioner

## 2022-12-02 ENCOUNTER — Ambulatory Visit: Payer: PPO | Admitting: Nurse Practitioner

## 2022-12-02 VITALS — BP 118/64 | HR 50 | Temp 97.6°F | Ht 76.0 in | Wt 244.2 lb

## 2022-12-02 DIAGNOSIS — H6121 Impacted cerumen, right ear: Secondary | ICD-10-CM

## 2022-12-02 MED ORDER — MECLIZINE HCL 25 MG PO TABS: 25.0000 mg | ORAL_TABLET | Freq: Three times a day (TID) | ORAL | 0 refills | Status: DC | PRN

## 2022-12-02 NOTE — Patient Instructions (Signed)
It was great to see you!  Start meclizine 1 tablet 3 times a day as needed for dizziness  Call us or send a message in the next few days if your ear starts hurting.   Let's follow-up if your symptoms worsen or don't improve.   Take care,  Rodman Pickle, NP

## 2022-12-02 NOTE — Progress Notes (Signed)
   Acute Office Visit  Subjective:     Patient ID: Nicholas Rasmussen, male    DOB: [birth year], 77 y.o.   MRN: [id number]  Chief Complaint  Patient presents with   Foreign Body in Ear    Patient feels like something in right ear-no pain, feeling  a different feel around right eye and right ear    HPI Patient is in today for feeling like something is in his right ear. He also noticed some dizziness yesterday when he was in the car.   EAR CLOGGED  Duration: 1  days Involved ear(s): yes "right Sensation of feeling clogged/plugged: yes Decreased/muffled hearing:no Ear pain: no Fever: no Otorrhea: no Hearing loss: no Upper respiratory infection symptoms: no Using Q-Tips: no Status: stable History of cerumenosis: no Treatments attempted: none   ROS See pertinent positives and negatives per HPI.     Objective:    BP 118/64 (BP Location: Left Arm)   Pulse (!) 50   Temp 97.6 F (36.4 C)   Ht 6\' 4"  (1.93 m)   Wt 244 lb 3.2 oz (110.8 kg)   SpO2 98%   BMI 29.72 kg/m    Physical Exam Vitals and nursing note reviewed.  Constitutional:      Appearance: Normal appearance.  HENT:     Head: Normocephalic.     Right Ear: External ear normal. There is impacted cerumen.     Left Ear: Tympanic membrane, ear canal and external ear normal.  Eyes:     Conjunctiva/sclera: Conjunctivae normal.  Cardiovascular:     Rate and Rhythm: Normal rate and regular rhythm.     Pulses: Normal pulses.     Heart sounds: Normal heart sounds.  Pulmonary:     Effort: Pulmonary effort is normal.     Breath sounds: Normal breath sounds.  Musculoskeletal:     Cervical back: Normal range of motion.  Skin:    General: Skin is warm.  Neurological:     General: No focal deficit present.     Mental Status: He is alert and oriented to person, place, and time.  Psychiatric:        Mood and Affect: Mood normal.        Behavior: Behavior normal.        Thought Content: Thought content normal.         Judgment: Judgment normal.     No results found for any visits on 12/02/22.      Assessment & Plan:   Problem List Items Addressed This Visit   None Visit Diagnoses     Impacted cerumen of right ear    -  Primary   After obtaining verbal consent right ear irrigated with success. He tolerated procedure well. Symptoms improved after irrigation. F/U with any concerns.       Meds ordered this encounter  Medications   meclizine (ANTIVERT) 25 MG tablet    Sig: Take 1 tablet (25 mg total) by mouth 3 (three) times daily as needed for dizziness.    Dispense:  30 tablet    Refill:  0    Return if symptoms worsen or fail to improve.  Gerre Scull, NP

## 2022-12-07 DIAGNOSIS — H00024 Hordeolum internum left upper eyelid: Secondary | ICD-10-CM | POA: Diagnosis not present

## 2022-12-07 NOTE — Progress Notes (Signed)
Cardiology Office Note:  .   Date:  12/21/2022  ID:  Nicholas Rasmussen, DOB 21-Nov-1945, MRN [id number] PCP: Anne Ng, NP  [facility] Providers Cardiologist:  Donato Schultz, MD    History of Present Illness: .   Nicholas Rasmussen is a 77 y.o. male  with a hx of vague chest discomfort and moderate risk coronary artery calcification score 156. Coincidentally found ascending aortic aneurysm, 44 mm CT chest 11/2020.Echo 12/2021 aorta 43 mm.  Patient comes in for yearly f/u. Travels a lot ballroom dancing.He practices 3 hrs twice a week and then competition on weekends for several hours. Needs knee surgery possibly partial knee replacement but not sure. Scheduled for 02/28/23 and we haven't received a request for clearance. Denies chest pain or dyspnea. Has a lot of acid reflux when he doesn't eat like he should. Worse when he lays down and has to belch.   ROS:    Studies Reviewed: Marland Kitchen    EKG Interpretation Date/Time:  Tuesday December 21 2022 07:59:59 EDT Ventricular Rate:  47 PR Interval:  220 QRS Duration:  90 QT Interval:  448 QTC Calculation: 396 R Axis:   -36  Text Interpretation: Sinus bradycardia with 1st degree A-V block Left axis deviation Nonspecific ST abnormality Confirmed by Jacolyn Reedy [phone]) on 12/21/2022 8:14:35 AM    Prior CV Studies: ECHO COMPLETE WO IMAGING ENHANCING AGENT 01/06/2022  Narrative ECHOCARDIOGRAM REPORT    Patient Name:   Nicholas Rasmussen Date of Exam: 01/06/2022 Medical Rec #:  [id number]      Height:       76.0 in Accession #:    [id number]     Weight:       246.2 lb Date of Birth:  05-Nicholas-1947     BSA:          2.420 m Patient Age:    75 years       BP:           122/78 mmHg Patient Gender: M              HR:           53 bpm. Exam Location:  [address]  Procedure: 2D Echo, Cardiac Doppler and Color Doppler  Indications:    I77.810 Dilated aortic root  History:        Patient has prior history of Echocardiogram examinations,  most recent 10/04/2018. CAD, Dilated aortic root; Risk Factors:Diabetes and Dyslipidemia.  Sonographer:    Samule Ohm RDCS Referring Phys: [id number] CHARLOTTE LUM NCHE  IMPRESSIONS   1. Left ventricular ejection fraction, by estimation, is 55 to 60%. The left ventricle has normal function. The left ventricle has no regional wall motion abnormalities. There is moderate concentric left ventricular hypertrophy. Left ventricular diastolic parameters are consistent with Grade II diastolic dysfunction (pseudonormalization). 2. Right ventricular systolic function is normal. The right ventricular size is normal. Tricuspid regurgitation signal is inadequate for assessing PA pressure. 3. The mitral valve is normal in structure. No evidence of mitral valve regurgitation. No evidence of mitral stenosis. 4. The aortic valve is tricuspid. Aortic valve regurgitation is not visualized. No aortic stenosis is present. 5. Aortic dilatation noted. There is moderate dilatation of the aortic root, measuring 43 mm. 6. The inferior vena cava is normal in size with greater than 50% respiratory variability, suggesting right atrial pressure of 3 mmHg.  FINDINGS Left Ventricle: Left ventricular ejection fraction, by estimation, is 55 to 60%. The left ventricle has normal function. The  left ventricle has no regional wall motion abnormalities. The left ventricular internal cavity size was normal in size. There is moderate concentric left ventricular hypertrophy. Left ventricular diastolic parameters are consistent with Grade II diastolic dysfunction (pseudonormalization).  Right Ventricle: The right ventricular size is normal. No increase in right ventricular wall thickness. Right ventricular systolic function is normal. Tricuspid regurgitation signal is inadequate for assessing PA pressure.  Left Atrium: Left atrial size was normal in size.  Right Atrium: Right atrial size was normal in size.  Pericardium: There is  no evidence of pericardial effusion.  Mitral Valve: The mitral valve is normal in structure. No evidence of mitral valve regurgitation. No evidence of mitral valve stenosis.  Tricuspid Valve: The tricuspid valve is normal in structure. Tricuspid valve regurgitation is trivial.  Aortic Valve: The aortic valve is tricuspid. Aortic valve regurgitation is not visualized. No aortic stenosis is present.  Pulmonic Valve: The pulmonic valve was normal in structure. Pulmonic valve regurgitation is trivial.  Aorta: Aortic dilatation noted. There is moderate dilatation of the aortic root, measuring 43 mm.  Venous: The inferior vena cava is normal in size with greater than 50% respiratory variability, suggesting right atrial pressure of 3 mmHg.  IAS/Shunts: No atrial level shunt detected by color flow Doppler.   LEFT VENTRICLE PLAX 2D LVIDd:         4.60 cm   Diastology LVIDs:         2.70 cm   LV e' medial:    6.74 cm/s LV PW:         1.30 cm   LV E/e' medial:  13.4 LV IVS:        1.50 cm   LV e' lateral:   10.00 cm/s LVOT diam:     2.40 cm   LV E/e' lateral: 9.0 LV SV:         119 LV SV Index:   49 LVOT Area:     4.52 cm   RIGHT VENTRICLE             IVC RV S prime:     15.10 cm/s  IVC diam: 1.20 cm TAPSE (M-mode): 1.8 cm  LEFT ATRIUM             Index        RIGHT ATRIUM           Index LA diam:        4.20 cm 1.74 cm/m   RA Pressure: 3.00 mmHg LA Vol (A2C):   62.8 ml 25.95 ml/m  RA Area:     11.00 cm LA Vol (A4C):   45.7 ml 18.89 ml/m  RA Volume:   22.50 ml  9.30 ml/m LA Biplane Vol: 58.4 ml 24.13 ml/m AORTIC VALVE LVOT Vmax:   119.00 cm/s LVOT Vmean:  75.700 cm/s LVOT VTI:    0.263 m  AORTA Ao Root diam: 4.30 cm Ao Asc diam:  3.50 cm  MITRAL VALVE               TRICUSPID VALVE MV Area (PHT): 2.87 cm    Estimated RAP:  3.00 mmHg MV Decel Time: 264 msec MV E velocity: 90.10 cm/s  SHUNTS MV A velocity: 69.20 cm/s  Systemic VTI:  0.26 m MV E/A ratio:  1.30         Systemic Diam: 2.40 cm  Dalton McleanMD Electronically signed by [name] Signature Date/Time: 01/06/2022/4:44:52 PM    Final       Risk  Assessment/Calculations:             Physical Exam:   VS:  BP 124/72   Pulse (!) 50   Ht 6\' 4"  (1.93 m)   Wt 252 lb 9.6 oz (114.6 kg)   SpO2 96%   BMI 30.75 kg/m    Wt Readings from Last 3 Encounters:  12/21/22 252 lb 9.6 oz (114.6 kg)  12/02/22 244 lb 3.2 oz (110.8 kg)  11/01/22 247 lb 3.2 oz (112.1 kg)    GEN: Well nourished, well developed in no acute distress NECK: No JVD; No carotid bruits CARDIAC:  RRR, no murmurs, rubs, gallops RESPIRATORY:  Clear to auscultation without rales, wheezing or rhonchi  ABDOMEN: Soft, non-tender, non-distended EXTREMITIES:  No edema; No deformity   ASSESSMENT AND PLAN: .    Preop clearance for either partial knee or total knee replacement 02/28/23 by Dr. Roda Shutters. We haven't received clearance request but can clear him today. He is very active doing ballroom dancing several days a week and METS>6. He has no cardiac symptoms. Will update echo for dilated aortic root. He can proceed with above surgery without further cardiac testing.  According to the Revised Cardiac Risk Index (RCRI), his Perioperative Risk of Major Cardiac Event is (%): 0.9  His Functional Capacity in METs is: 6.79 according to the Duke Activity Status Index (DASI).    CAD with calcium score 151 and mild CAD distal LM, mLAD and mRCA CCTA 05/2017. No chest pain. On toprol 25 mg and ASA 81 mg daily.   Aortic root dilatation 44 mm 11/2020 43 mm on echo 01/06/22-update echo   HTN BP controlled. He watches salt closely   HLD LDL 68 05/2021   DM2 A1C 5.8 10/2022   GERD-has a lot of reflux and belching. Continue prilosec            Dispo: f/u in 1 yr  Signed, Jacolyn Reedy, PA-C

## 2022-12-21 ENCOUNTER — Encounter: Payer: Self-pay | Admitting: Physician Assistant

## 2022-12-21 ENCOUNTER — Telehealth: Payer: Self-pay

## 2022-12-21 ENCOUNTER — Ambulatory Visit: Payer: PPO | Attending: Physician Assistant | Admitting: Physician Assistant

## 2022-12-21 VITALS — BP 124/72 | HR 50 | Ht 76.0 in | Wt 252.6 lb

## 2022-12-21 DIAGNOSIS — K219 Gastro-esophageal reflux disease without esophagitis: Secondary | ICD-10-CM

## 2022-12-21 DIAGNOSIS — Z01818 Encounter for other preprocedural examination: Secondary | ICD-10-CM

## 2022-12-21 DIAGNOSIS — Z7984 Long term (current) use of oral hypoglycemic drugs: Secondary | ICD-10-CM | POA: Diagnosis not present

## 2022-12-21 DIAGNOSIS — E119 Type 2 diabetes mellitus without complications: Secondary | ICD-10-CM

## 2022-12-21 DIAGNOSIS — I10 Essential (primary) hypertension: Secondary | ICD-10-CM

## 2022-12-21 DIAGNOSIS — I25119 Atherosclerotic heart disease of native coronary artery with unspecified angina pectoris: Secondary | ICD-10-CM

## 2022-12-21 DIAGNOSIS — I77810 Thoracic aortic ectasia: Secondary | ICD-10-CM

## 2022-12-21 DIAGNOSIS — E785 Hyperlipidemia, unspecified: Secondary | ICD-10-CM | POA: Diagnosis not present

## 2022-12-21 NOTE — Patient Instructions (Addendum)
Medication Instructions:  Your physician recommends that you continue on your current medications as directed. Please refer to the Current Medication list given to you today.  *If you need a refill on your cardiac medications before your next appointment, please call your pharmacy*   Lab Work: Your physician recommends that you return for lab work  when you have your echocardiogram  Fasting Lipids, Cmp, Cbc  If you have labs (blood work) drawn today and your tests are completely normal, you will receive your results only by: MyChart Message (if you have MyChart) OR A paper copy in the mail If you have any lab test that is abnormal or we need to change your treatment, we will call you to review the results.   Testing/Procedures: Your physician has requested that you have an echocardiogram. Echocardiography is a painless test that uses sound waves to create images of your heart. It provides your doctor with information about the size and shape of your heart and how well your heart's chambers and valves are working. This procedure takes approximately one hour. There are no restrictions for this procedure. Please do NOT wear cologne, perfume, aftershave, or lotions (deodorant is allowed). Please arrive 15 minutes prior to your appointment time.    Follow-Up: At [facility], you and your health needs are our priority.  As part of our continuing mission to provide you with exceptional heart care, we have created designated Provider Care Teams.  These Care Teams include your primary Cardiologist (physician) and Advanced Practice Providers (APPs -  Physician Assistants and Nurse Practitioners) who all work together to provide you with the care you need, when you need it.  We recommend signing up for the patient portal called "MyChart".  Sign up information is provided on this After Visit Summary.  MyChart is used to connect with patients for Virtual Visits (Telemedicine).  Patients are  able to view lab/test results, encounter notes, upcoming appointments, etc.  Non-urgent messages can be sent to your provider as well.   To learn more about what you can do with MyChart, go to [email or web address].    Your next appointment:   12 month(s)  Provider:   Dr. Donato Schultz   Other Instructions

## 2022-12-21 NOTE — Telephone Encounter (Signed)
   Pre-operative Risk Assessment    Patient Name: Nicholas Rasmussen  DOB: 1945-09-19 MRN: [id number]     Request for Surgical Clearance    Procedure:  Left unicompartmental knee vs total knee arthroplasty   Date of Surgery:  Clearance 02/28/23                                 Surgeon:  Dr. Gershon Mussel Surgeon's Group or Practice Name:  [facility] Phone number:  [phone] Fax number:  [phone]   Type of Clearance Requested:   - Medical    Type of Anesthesia:  Spinal   Additional requests/questions:    SignedVernard Gambles   12/21/2022, 8:19 AM

## 2022-12-31 ENCOUNTER — Ambulatory Visit (INDEPENDENT_AMBULATORY_CARE_PROVIDER_SITE_OTHER): Payer: PPO | Admitting: Nurse Practitioner

## 2022-12-31 ENCOUNTER — Encounter: Payer: Self-pay | Admitting: Nurse Practitioner

## 2022-12-31 ENCOUNTER — Telehealth: Payer: Self-pay

## 2022-12-31 VITALS — BP 122/80 | HR 44 | Temp 98.0°F | Resp 16 | Ht 76.0 in | Wt 247.0 lb

## 2022-12-31 DIAGNOSIS — G40909 Epilepsy, unspecified, not intractable, without status epilepticus: Secondary | ICD-10-CM | POA: Diagnosis not present

## 2022-12-31 DIAGNOSIS — E785 Hyperlipidemia, unspecified: Secondary | ICD-10-CM

## 2022-12-31 DIAGNOSIS — E1141 Type 2 diabetes mellitus with diabetic mononeuropathy: Secondary | ICD-10-CM | POA: Diagnosis not present

## 2022-12-31 DIAGNOSIS — I25119 Atherosclerotic heart disease of native coronary artery with unspecified angina pectoris: Secondary | ICD-10-CM

## 2022-12-31 DIAGNOSIS — Z01818 Encounter for other preprocedural examination: Secondary | ICD-10-CM

## 2022-12-31 DIAGNOSIS — N2889 Other specified disorders of kidney and ureter: Secondary | ICD-10-CM

## 2022-12-31 DIAGNOSIS — Z7984 Long term (current) use of oral hypoglycemic drugs: Secondary | ICD-10-CM

## 2022-12-31 DIAGNOSIS — E1169 Type 2 diabetes mellitus with other specified complication: Secondary | ICD-10-CM | POA: Diagnosis not present

## 2022-12-31 DIAGNOSIS — E1142 Type 2 diabetes mellitus with diabetic polyneuropathy: Secondary | ICD-10-CM | POA: Diagnosis not present

## 2022-12-31 LAB — CBC
HCT: 42.2 % (ref 39.0–52.0)
Hemoglobin: 13.5 g/dL (ref 13.0–17.0)
MCHC: 31.9 g/dL (ref 30.0–36.0)
MCV: 83.7 fl (ref 78.0–100.0)
Platelets: 248 10*3/uL (ref 150.0–400.0)
RBC: 5.04 Mil/uL (ref 4.22–5.81)
RDW: 15 % (ref 11.5–15.5)
WBC: 5.4 10*3/uL (ref 4.0–10.5)

## 2022-12-31 LAB — COMPREHENSIVE METABOLIC PANEL
ALT: 8 U/L (ref 0–53)
AST: 18 U/L (ref 0–37)
Albumin: 4.4 g/dL (ref 3.5–5.2)
Alkaline Phosphatase: 55 U/L (ref 39–117)
BUN: 18 mg/dL (ref 6–23)
CO2: 31 mEq/L (ref 19–32)
Calcium: 9.7 mg/dL (ref 8.4–10.5)
Chloride: 98 mEq/L (ref 96–112)
Creatinine, Ser: 1.23 mg/dL (ref 0.40–1.50)
GFR: 57.72 mL/min — ABNORMAL LOW (ref >60.00)
Glucose, Bld: 105 mg/dL — ABNORMAL HIGH (ref 70–99)
Potassium: 4.3 mEq/L (ref 3.5–5.1)
Sodium: 136 mEq/L (ref 135–145)
Total Bilirubin: 0.4 mg/dL (ref 0.2–1.2)
Total Protein: 7.6 g/dL (ref 6.0–8.3)

## 2022-12-31 LAB — LIPID PANEL
Cholesterol: 158 mg/dL (ref 0–200)
HDL: 50.2 mg/dL (ref >39.00)
LDL Cholesterol: 84 mg/dL (ref 0–99)
NonHDL: 107.33
Total CHOL/HDL Ratio: 3
Triglycerides: 118 mg/dL (ref 0.0–149.0)
VLDL: 23.6 mg/dL (ref 0.0–40.0)

## 2022-12-31 LAB — HEMOGLOBIN A1C: Hgb A1c MFr Bld: 6.4 % (ref 4.6–6.5)

## 2022-12-31 LAB — MICROALBUMIN / CREATININE URINE RATIO
Creatinine,U: 344.4 mg/dL
Microalb Creat Ratio: 0.3 mg/g (ref 0.0–30.0)
Microalb, Ur: 1.2 mg/dL (ref 0.0–1.9)

## 2022-12-31 MED ORDER — DIVALPROEX SODIUM ER 500 MG PO TB24
500.0000 mg | ORAL_TABLET | Freq: Every day | ORAL | 3 refills | Status: DC
Start: 2022-12-31 — End: 2023-08-01

## 2022-12-31 MED ORDER — ATORVASTATIN CALCIUM 20 MG PO TABS
20.0000 mg | ORAL_TABLET | Freq: Every evening | ORAL | 3 refills | Status: DC
Start: 2022-12-31 — End: 2023-07-22

## 2022-12-31 NOTE — Assessment & Plan Note (Addendum)
BP at goal,  Repeat HgbA1c at 6.4%,  current use of atorvastatin. No CP or SOB Under the care of cardiology Scheduled for repeat echcardiogram

## 2022-12-31 NOTE — Assessment & Plan Note (Signed)
Controlled with use of gabapentin 300mg  at hs Completed foot exam today: diminished vibratory sensation. Skin intact. Maintain med dose

## 2022-12-31 NOTE — Patient Instructions (Signed)
Visit Information  Thank you for taking time to visit with me today. Please don't hesitate to contact me if I can be of assistance to you.   Following are the goals we discussed today:   Goals Addressed             This Visit's Progress    COMPLETED: Care Coordination Activities-No follow up required       Care Coordination Interventions: Discussed [facility] services and support. Assessed SDOH. Advised to discuss with primary care physician if services needed in the future.         If you are experiencing a Mental Health or Behavioral Health Crisis or need someone to talk to, please call the Suicide and Crisis Lifeline: 988   Patient verbalizes understanding of instructions and care plan provided today and agrees to view in MyChart. Active MyChart status and patient understanding of how to access instructions and care plan via MyChart confirmed with patient.     The patient has been provided with contact information for the care management team and has been advised to call with any health related questions or concerns.   Dionne J Leath, RN, MSN [facility] Care Management Care Management Coordinator Direct Line [phone]     

## 2022-12-31 NOTE — Patient Outreach (Signed)
  Care Coordination   In Person Provider Office Visit Note   12/31/2022 Name: Nicholas Rasmussen MRN: [id number] DOB: 1945/06/30  Nicholas Rasmussen is a 77 y.o. year old male who sees Nche, Bonna Gains, NP for primary care. I engaged with [name] in the providers office today.  What matters to the patients health and wellness today?  none    Goals Addressed             This Visit's Progress    COMPLETED: Care Coordination Activities-No follow up required       Care Coordination Interventions: Discussed [facility] services and support. Assessed SDOH. Advised to discuss with primary care physician if services needed in the future.        SDOH assessments and interventions completed:  Yes  SDOH Interventions Today    Flowsheet Row Most Recent Value  SDOH Interventions   Housing Interventions Intervention Not Indicated  Transportation Interventions Intervention Not Indicated        Care Coordination Interventions:  Yes, provided   Follow up plan: No further intervention required.   Encounter Outcome:  Pt. Visit Completed   Bary Leriche, RN, MSN [facility] Care Management Care Management Coordinator Direct Line [phone]

## 2022-12-31 NOTE — Assessment & Plan Note (Signed)
No seizure activity in last 6months. Maintain depakote dose Refill sent

## 2022-12-31 NOTE — Assessment & Plan Note (Signed)
Jardiance switched to glipizide due to high copay and yeast infection Repeat hgbA1c: 6.4% Maintain glipizide dose Advised to be mindful of diet

## 2022-12-31 NOTE — Assessment & Plan Note (Addendum)
MRI 08/15/2022: No significant interval change in a complex lesion of the lateral midportion of the right kidney measuring 1.2 x 1.1 cm. Although unchanged in the immediate interval, this appears to have slowly enlarged in comparison to prior examinations dating back to 07/22/2020, at which time it measured no greater than 0.7 cm. No evidence of lymphadenopathy or metastatic disease in the abdomen.  He had Eval by [facility] urology (Dr. Pete Glatter) 08/2022 and [facility] (Dr. Liliane Shi) 09/2022. Did not recommend surgery at this time Plan to repeat MRI 02/15/2023.

## 2022-12-31 NOTE — Patient Instructions (Signed)
Go to lab Maintain current medications.

## 2022-12-31 NOTE — Assessment & Plan Note (Signed)
Repeat lipid panel Maintain atorvastatin dose 

## 2022-12-31 NOTE — Progress Notes (Signed)
Established Patient Visit  Patient: Nicholas Rasmussen   DOB: 12-Feb-1946   77 y.o. Male  MRN: [id number] Visit Date: 12/31/2022  Subjective:    Chief Complaint  Patient presents with   Hypertension   Hyperlipidemia    Fasting   Diabetes   HPI Renal mass, right; Bosniak 4 complex cyst; 1 cm MRI 08/15/2022: No significant interval change in a complex lesion of the lateral midportion of the right kidney measuring 1.2 x 1.1 cm. Although unchanged in the immediate interval, this appears to have slowly enlarged in comparison to prior examinations dating back to 07/22/2020, at which time it measured no greater than 0.7 cm. No evidence of lymphadenopathy or metastatic disease in the abdomen.  He had Eval by [facility] urology (Dr. Pete Glatter) 08/2022 and [facility] (Dr. Liliane Shi) 09/2022. Did not recommend surgery at this time Plan to repeat MRI 02/15/2023.  Hyperlipidemia associated with type 2 diabetes mellitus (HCC) Repeat lipid panel Maintain atorvastatin dose  Diabetic neuropathy (HCC) Controlled with use of gabapentin 300mg  at hs Completed foot exam today: diminished vibratory sensation. Skin intact. Maintain med dose  Coronary artery disease involving native coronary artery of native heart with angina pectoris (HCC) BP at goal,  Repeat HgbA1c at 6.4%,  current use of atorvastatin. No CP or SOB Under the care of cardiology Scheduled for repeat echcardiogram   Seizure disorder No seizure activity in last 6months. Maintain depakote dose Refill sent  Type 2 diabetes mellitus with diabetic polyneuropathy, without long-term current use of insulin (HCC) Jardiance switched to glipizide due to high copay and yeast infection Repeat hgbA1c: 6.4% Maintain glipizide dose Advised to be mindful of diet  Reviewed medical, surgical, and social history today  Medications: Outpatient Medications Prior to Visit  Medication Sig   acetaminophen (TYLENOL) 500 MG tablet Take 1  tablet (500 mg total) by mouth every 8 (eight) hours as needed.   acyclovir (ZOVIRAX) 200 MG capsule Take 1 capsule (200 mg total) by mouth 5 (five) times daily.   acyclovir cream (ZOVIRAX) 5 % Apply 1 Application topically every 6 (six) hours.   aspirin EC 81 MG tablet Take 81 mg by mouth daily. Swallow whole.   cetirizine (ZYRTEC) 10 MG tablet TAKE 1 TABLET BY MOUTH EVERYDAY AT BEDTIME   clotrimazole (LOTRIMIN) 1 % cream APPLY TO AFFECTED AREA TWICE A DAY   diclofenac Sodium (VOLTAREN) 1 % GEL Apply a small grape sized dollop to sore joint up to every 6 hours as needed   docusate sodium (COLACE) 100 MG capsule TAKE 1 CAPSULE BY MOUTH TWICE A DAY   gabapentin (NEURONTIN) 300 MG capsule TAKE 1 CAPSULE BY MOUTH EVERYDAY AT BEDTIME   glipiZIDE (GLUCOTROL XL) 5 MG 24 hr tablet TAKE 1 TABLET BY MOUTH EVERY DAY WITH BREAKFAST   ibuprofen (ADVIL) 800 MG tablet Take 1 tablet (800 mg total) by mouth 3 (three) times daily. (Patient taking differently: Take 800 mg by mouth as needed.)   ketoconazole (NIZORAL) 2 % shampoo APPLY 1 APPLICATION TOPICALLY 2 (TWO) TIMES A WEEK.   meclizine (ANTIVERT) 25 MG tablet Take 1 tablet (25 mg total) by mouth 3 (three) times daily as needed for dizziness.   metoprolol succinate (TOPROL-XL) 25 MG 24 hr tablet TAKE 1 TABLET (25 MG TOTAL) BY MOUTH DAILY.   omeprazole (PRILOSEC) 20 MG capsule TAKE 1 CAPSULE BY MOUTH EVERY DAY AS NEEDED   [DISCONTINUED] atorvastatin (LIPITOR) 20 MG tablet  TAKE 1 TABLET BY MOUTH DAILY AT 6 PM.   [DISCONTINUED] divalproex (DEPAKOTE ER) 500 MG 24 hr tablet Take 1 tablet (500 mg total) by mouth at bedtime.   No facility-administered medications prior to visit.   Reviewed past medical and social history.   ROS per HPI above       Objective:  BP 122/80 (BP Location: Left Arm, Patient Position: Sitting, Cuff Size: Normal)   Pulse (!) 44   Temp 98 F (36.7 C)   Resp 16   Ht 6\' 4"  (1.93 m)   Wt 247 lb (112 kg)   SpO2 95%   BMI 30.07  kg/m      Physical Exam Cardiovascular:     Rate and Rhythm: Normal rate and regular rhythm.     Pulses: Normal pulses.     Heart sounds: Normal heart sounds.  Pulmonary:     Effort: Pulmonary effort is normal.     Breath sounds: Normal breath sounds.  Neurological:     Mental Status: He is alert and oriented to person, place, and time.     Results for orders placed or performed in visit on 12/31/22  Microalbumin / creatinine urine ratio  Result Value Ref Range   Microalb, Ur 1.2 0.0 - 1.9 mg/dL   Creatinine,U 161.0 mg/dL   Microalb Creat Ratio 0.3 0.0 - 30.0 mg/g  Hemoglobin A1c  Result Value Ref Range   Hgb A1c MFr Bld 6.4 4.6 - 6.5 %  Lipid panel  Result Value Ref Range   Cholesterol 158 0 - 200 mg/dL   Triglycerides 960.4 0.0 - 149.0 mg/dL   HDL 54.09 >81.19 mg/dL   VLDL 14.7 0.0 - 82.9 mg/dL   LDL Cholesterol 84 0 - 99 mg/dL   Total CHOL/HDL Ratio 3    NonHDL 107.33   Comprehensive metabolic panel  Result Value Ref Range   Sodium 136 135 - 145 mEq/L   Potassium 4.3 3.5 - 5.1 mEq/L   Chloride 98 96 - 112 mEq/L   CO2 31 19 - 32 mEq/L   Glucose, Bld 105 (H) 70 - 99 mg/dL   BUN 18 6 - 23 mg/dL   Creatinine, Ser 5.62 0.40 - 1.50 mg/dL   Total Bilirubin 0.4 0.2 - 1.2 mg/dL   Alkaline Phosphatase 55 39 - 117 U/L   AST 18 0 - 37 U/L   ALT 8 0 - 53 U/L   Total Protein 7.6 6.0 - 8.3 g/dL   Albumin 4.4 3.5 - 5.2 g/dL   GFR 13.08 (L) >65.78 mL/min   Calcium 9.7 8.4 - 10.5 mg/dL  CBC  Result Value Ref Range   WBC 5.4 4.0 - 10.5 K/uL   RBC 5.04 4.22 - 5.81 Mil/uL   Platelets 248.0 150.0 - 400.0 K/uL   Hemoglobin 13.5 13.0 - 17.0 g/dL   HCT 46.9 62.9 - 52.8 %   MCV 83.7 78.0 - 100.0 fl   MCHC 31.9 30.0 - 36.0 g/dL   RDW 41.3 24.4 - 01.0 %      Assessment & Plan:    Problem List Items Addressed This Visit       Cardiovascular and Mediastinum   Coronary artery disease involving native coronary artery of native heart with angina pectoris (HCC)    BP at goal,   Repeat HgbA1c at 6.4%,  current use of atorvastatin. No CP or SOB Under the care of cardiology Scheduled for repeat echcardiogram       Relevant Medications  atorvastatin (LIPITOR) 20 MG tablet     Endocrine   Diabetic neuropathy (HCC) - Primary    Controlled with use of gabapentin 300mg  at hs Completed foot exam today: diminished vibratory sensation. Skin intact. Maintain med dose      Relevant Medications   atorvastatin (LIPITOR) 20 MG tablet   Hyperlipidemia associated with type 2 diabetes mellitus (HCC)    Repeat lipid panel Maintain atorvastatin dose      Relevant Medications   atorvastatin (LIPITOR) 20 MG tablet   Other Relevant Orders   Lipid panel (Completed)   Comprehensive metabolic panel (Completed)   Type 2 diabetes mellitus with diabetic polyneuropathy, without long-term current use of insulin (HCC)    Jardiance switched to glipizide due to high copay and yeast infection Repeat hgbA1c: 6.4% Maintain glipizide dose Advised to be mindful of diet      Relevant Medications   atorvastatin (LIPITOR) 20 MG tablet   divalproex (DEPAKOTE ER) 500 MG 24 hr tablet   Other Relevant Orders   Microalbumin / creatinine urine ratio (Completed)   Hemoglobin A1c (Completed)   Comprehensive metabolic panel (Completed)     Nervous and Auditory   Seizure disorder (HCC)    No seizure activity in last 6months. Maintain depakote dose Refill sent      Relevant Medications   divalproex (DEPAKOTE ER) 500 MG 24 hr tablet     Other   Renal mass, right; Bosniak 4 complex cyst; 1 cm    MRI 08/15/2022: No significant interval change in a complex lesion of the lateral midportion of the right kidney measuring 1.2 x 1.1 cm. Although unchanged in the immediate interval, this appears to have slowly enlarged in comparison to prior examinations dating back to 07/22/2020, at which time it measured no greater than 0.7 cm. No evidence of lymphadenopathy or metastatic disease in  the abdomen.  He had Eval by [facility] urology (Dr. Pete Glatter) 08/2022 and [facility] (Dr. Liliane Shi) 09/2022. Did not recommend surgery at this time Plan to repeat MRI 02/15/2023.      Other Visit Diagnoses     Preoperative clearance       Relevant Orders   Comprehensive metabolic panel (Completed)   CBC (Completed)      Return in about 6 months (around 07/03/2023) for HTN, DM, hyperlipidemia (fasting).     Alysia Penna, NP

## 2023-01-06 DIAGNOSIS — E119 Type 2 diabetes mellitus without complications: Secondary | ICD-10-CM | POA: Diagnosis not present

## 2023-01-11 ENCOUNTER — Ambulatory Visit (HOSPITAL_COMMUNITY): Payer: PPO | Attending: Cardiology

## 2023-01-11 ENCOUNTER — Other Ambulatory Visit: Payer: PPO

## 2023-01-11 DIAGNOSIS — I361 Nonrheumatic tricuspid (valve) insufficiency: Secondary | ICD-10-CM

## 2023-01-11 DIAGNOSIS — I10 Essential (primary) hypertension: Secondary | ICD-10-CM | POA: Insufficient documentation

## 2023-01-11 DIAGNOSIS — I77810 Thoracic aortic ectasia: Secondary | ICD-10-CM | POA: Insufficient documentation

## 2023-01-11 DIAGNOSIS — I2510 Atherosclerotic heart disease of native coronary artery without angina pectoris: Secondary | ICD-10-CM | POA: Diagnosis not present

## 2023-01-11 DIAGNOSIS — Q2543 Congenital aneurysm of aorta: Secondary | ICD-10-CM | POA: Diagnosis not present

## 2023-01-11 DIAGNOSIS — E785 Hyperlipidemia, unspecified: Secondary | ICD-10-CM | POA: Diagnosis not present

## 2023-01-11 DIAGNOSIS — E119 Type 2 diabetes mellitus without complications: Secondary | ICD-10-CM | POA: Insufficient documentation

## 2023-01-11 LAB — ECHOCARDIOGRAM COMPLETE
Area-P 1/2: 2.92 cm2
S' Lateral: 2.7 cm

## 2023-01-28 ENCOUNTER — Other Ambulatory Visit: Payer: Self-pay | Admitting: Urology

## 2023-01-28 DIAGNOSIS — D49511 Neoplasm of unspecified behavior of right kidney: Secondary | ICD-10-CM

## 2023-01-30 DIAGNOSIS — R0981 Nasal congestion: Secondary | ICD-10-CM | POA: Diagnosis not present

## 2023-01-30 DIAGNOSIS — U071 COVID-19: Secondary | ICD-10-CM | POA: Diagnosis not present

## 2023-02-01 ENCOUNTER — Telehealth: Payer: Self-pay | Admitting: Orthopaedic Surgery

## 2023-02-01 DIAGNOSIS — E119 Type 2 diabetes mellitus without complications: Secondary | ICD-10-CM | POA: Diagnosis not present

## 2023-02-01 NOTE — Telephone Encounter (Signed)
Yes he will closer to time of surgery.  He's welcome to come in anytime

## 2023-02-01 NOTE — Telephone Encounter (Signed)
Patient called asked if Dr. Roda Shutters wanted him to get another X-ray prior to him having surgery in October? The number to contact patient is [phone]

## 2023-02-04 DIAGNOSIS — H35032 Hypertensive retinopathy, left eye: Secondary | ICD-10-CM | POA: Diagnosis not present

## 2023-02-04 DIAGNOSIS — E119 Type 2 diabetes mellitus without complications: Secondary | ICD-10-CM | POA: Diagnosis not present

## 2023-02-04 DIAGNOSIS — H524 Presbyopia: Secondary | ICD-10-CM | POA: Diagnosis not present

## 2023-02-04 DIAGNOSIS — H00024 Hordeolum internum left upper eyelid: Secondary | ICD-10-CM | POA: Diagnosis not present

## 2023-02-04 DIAGNOSIS — H26493 Other secondary cataract, bilateral: Secondary | ICD-10-CM | POA: Diagnosis not present

## 2023-02-04 LAB — HM DIABETES EYE EXAM

## 2023-02-07 ENCOUNTER — Telehealth: Payer: Self-pay | Admitting: Nurse Practitioner

## 2023-02-07 NOTE — Telephone Encounter (Signed)
Please advise 

## 2023-02-07 NOTE — Telephone Encounter (Signed)
Pt would like to start taking [employer] a dietary supplement, he is wondering if this is safe for him. Please advise pt at [phone].

## 2023-02-08 ENCOUNTER — Other Ambulatory Visit: Payer: Self-pay | Admitting: Nurse Practitioner

## 2023-02-08 DIAGNOSIS — G40909 Epilepsy, unspecified, not intractable, without status epilepticus: Secondary | ICD-10-CM

## 2023-02-10 ENCOUNTER — Other Ambulatory Visit (INDEPENDENT_AMBULATORY_CARE_PROVIDER_SITE_OTHER): Payer: PPO

## 2023-02-10 ENCOUNTER — Ambulatory Visit (INDEPENDENT_AMBULATORY_CARE_PROVIDER_SITE_OTHER): Payer: PPO | Admitting: Orthopaedic Surgery

## 2023-02-10 DIAGNOSIS — M1712 Unilateral primary osteoarthritis, left knee: Secondary | ICD-10-CM | POA: Diagnosis not present

## 2023-02-11 DIAGNOSIS — R519 Headache, unspecified: Secondary | ICD-10-CM | POA: Diagnosis not present

## 2023-02-11 DIAGNOSIS — Z20822 Contact with and (suspected) exposure to covid-19: Secondary | ICD-10-CM | POA: Diagnosis not present

## 2023-02-11 DIAGNOSIS — R439 Unspecified disturbances of smell and taste: Secondary | ICD-10-CM | POA: Diagnosis not present

## 2023-02-11 DIAGNOSIS — R059 Cough, unspecified: Secondary | ICD-10-CM | POA: Diagnosis not present

## 2023-02-11 NOTE — Pre-Procedure Instructions (Addendum)
Nicholas Rasmussen    Surgical Instructions   Your procedure is scheduled on Oct. 7, 2024 from 12:24PM-2:41PM.  Report to [facility] Main Entrance "A" at 9:55 A.M., then check in with the Admitting office.  Call this number if you have problems the morning of surgery:  [phone]  If you experience any cold or flu symptoms such as cough, fever, chills, shortness of breath, etc. between now and your scheduled surgery, please notify us at the above number.   Remember:  Do not eat after midnight the night before your surgery  You may drink clear liquids until 3 hour (9:25AM) prior to surgery time, the morning of your surgery.   Clear liquids allowed are: Water, Non-Citrus Juices (without pulp), Carbonated Beverages, Clear Tea, Black Coffee ONLY (NO MILK, CREAM OR POWDERED CREAMER of any kind), and Gatorade Non-Citric Juice (no pulp), Black Coffee Only  (no dairy or creamer), Clear Tea (no dairy or creamer), Carbonated Beverages, Gatorade/Powerade, Plain Jell-O, Plain Popsicles   Please complete your PRE-SURGERY ENSURE that was provided to you by (9:25AM) the morning of surgery.  Please, if able, drink it in one setting. DO NOT SIP.     Take these medicines the morning of surgery with A SIP OF WATER:  metoprolol succinate (TOPROL-XL)  omeprazole (PRILOSEC)  ipratropium (ATROVENT) nasal spray   If Needed: acetaminophen (TYLENOL)   Follow your surgeon's instructions regarding Aspirin.  As of today, STOP taking any Aspirin (unless otherwise instructed by your surgeon) Aleve, Naproxen, Ibuprofen, Motrin, Advil, Goody's, BC's, all herbal medications, fish oil, and all vitamins.  How to Manage Your Diabetes Before and After Surgery  How do I manage my blood sugar before surgery? Check your blood sugar the morning of your surgery when you wake up and every 2 hours until you get to the Short Stay unit. If your blood sugar is less than 70 mg/dL, you will need to treat for low blood  sugar: Do not take insulin. Treat a low blood sugar (less than 70 mg/dL) with  cup of clear juice (cranberry or apple), 4 glucose tablets, OR glucose gel. Recheck blood sugar in 15 minutes after treatment (to make sure it is greater than 70 mg/dL). If your blood sugar is not greater than 70 mg/dL on recheck, call [phone]  for further instructions. Report your blood sugar to the short stay nurse when you get to Short Stay.  WHAT DO I DO ABOUT MY DIABETES MEDICATION?  Do not take GlipiZIDE (GLUCOTROL XL) the morning of surgery.  If your CBG is greater than 220 mg/dL, call the number above for further instructions.  Reviewed and Endorsed by [facility] Patient Education Committee, August 2015             Day of Surgery: Do not wear jewelry. Do not wear lotions, powders, colognes, or deodorant. Do not shave arms, armpits, groin, and legs 48 hours prior to surgery.  Men may shave face and neck. Do not bring valuables to the hospital.             [facility] is not responsible for any belongings or valuables.  Do NOT Smoke (Tobacco/Vaping)  24 hours prior to your procedure  If you use a CPAP at night, you may bring your mask and machine for your overnight stay.   Contacts, glasses, hearing aids, dentures or partials may not be worn into surgery, please bring cases for these belongings   For patients admitted to the hospital, discharge  time will be determined by your treatment team.   Patients discharged the day of surgery will not be allowed to drive home, and someone needs to stay with them for 24 hours.  Special instructions:    Oral Hygiene is also important to reduce your risk of infection.  Remember - BRUSH YOUR TEETH THE MORNING OF SURGERY WITH YOUR REGULAR TOOTHPASTE   Pre-operative 5 CHG Bath Instructions   You can play a key role in reducing the risk of infection after surgery. Your skin needs to be as free of germs as possible. You can reduce the number of germs on  your skin by washing with CHG (chlorhexidine gluconate) soap before surgery. CHG is an antiseptic soap that kills germs and continues to kill germs even after washing.   DO NOT use if you have an allergy to chlorhexidine/CHG or antibacterial soaps. If your skin becomes reddened or irritated, stop using the CHG and notify one of our RNs at [phone].   Please shower with the CHG soap starting 4 days before surgery using the following schedule:     Please keep in mind the following:  DO NOT shave, including legs and underarms, starting the day of your first shower.   You may shave your face at any point before/day of surgery.  Place clean sheets on your bed the day you start using CHG soap. Use a clean washcloth (not used since being washed) for each shower. DO NOT sleep with pets once you start using the CHG.   CHG Shower Instructions:  If you choose to wash your hair and private area, wash first with your normal shampoo/soap.  After you use shampoo/soap, rinse your hair and body thoroughly to remove shampoo/soap residue.  Turn the water OFF and apply about 3 tablespoons (45 ml) of CHG soap to a CLEAN washcloth.  Apply CHG soap ONLY FROM YOUR NECK DOWN TO YOUR TOES (washing for 3-5 minutes)  DO NOT use CHG soap on face, private areas, open wounds, or sores.  Pay special attention to the area where your surgery is being performed.  If you are having back surgery, having someone wash your back for you may be helpful. Wait 2 minutes after CHG soap is applied, then you may rinse off the CHG soap.  Pat dry with a clean towel  Put on clean clothes/pajamas   If you choose to wear lotion, please use ONLY the CHG-compatible lotions on the back of this paper.     Additional instructions for the day of surgery: DO NOT APPLY any lotions, deodorants, cologne, or perfumes.   Put on clean/comfortable clothes.  Brush your teeth.  Ask your nurse before applying any prescription medications to the  skin.   CHG Compatible Lotions   Aveeno Moisturizing lotion  Cetaphil Moisturizing Cream  Cetaphil Moisturizing Lotion  Clairol Herbal Essence Moisturizing Lotion, Dry Skin  Clairol Herbal Essence Moisturizing Lotion, Extra Dry Skin  Clairol Herbal Essence Moisturizing Lotion, Normal Skin  Curel Age Defying Therapeutic Moisturizing Lotion with Alpha Hydroxy  Curel Extreme Care Body Lotion  Curel Soothing Hands Moisturizing Hand Lotion  Curel Therapeutic Moisturizing Cream, Fragrance-Free  Curel Therapeutic Moisturizing Lotion, Fragrance-Free  Curel Therapeutic Moisturizing Lotion, Original Formula  Eucerin Daily Replenishing Lotion  Eucerin Dry Skin Therapy Plus Alpha Hydroxy Crme  Eucerin Dry Skin Therapy Plus Alpha Hydroxy Lotion  Eucerin Original Crme  Eucerin Original Lotion  Eucerin Plus Crme Eucerin Plus Lotion  Eucerin TriLipid Replenishing Lotion  Keri Anti-Bacterial Hand Lotion  Keri Deep Conditioning Original Lotion Dry Skin Formula Softly Scented  Keri Deep Conditioning Original Lotion, Fragrance Free Sensitive Skin Formula  Keri Lotion Fast Absorbing Fragrance Free Sensitive Skin Formula  Keri Lotion Fast Absorbing Softly Scented Dry Skin Formula  Keri Original Lotion  Keri Skin Renewal Lotion Keri Silky Smooth Lotion  Keri Silky Smooth Sensitive Skin Lotion  Nivea Body Creamy Conditioning Oil  Nivea Body Extra Enriched Teacher, adult education Moisturizing Lotion Nivea Crme  Nivea Skin Firming Lotion  NutraDerm 30 Skin Lotion  NutraDerm Skin Lotion  NutraDerm Therapeutic Skin Cream  NutraDerm Therapeutic Skin Lotion  ProShield Protective Hand Cream  Provon moisturizing lotion   Reminder: Day of Surgery Take a shower with CHG soap. Wear Clean/Comfortable clothing the morning of surgery Do not apply any deodorants/lotions.   Remember to brush your teeth WITH YOUR REGULAR TOOTHPASTE.  SURGICAL WAITING ROOM  VISITATION Patients having surgery or a procedure may have no more than 2 support people in the waiting area - these visitors may rotate.   Children under the age of 78 must have an adult with them who is not the patient. If the patient needs to stay at the hospital during part of their recovery, the visitor guidelines for inpatient rooms apply. Pre-op nurse will coordinate an appropriate time for 1 support person to accompany patient in pre-op.  This support person may not rotate.    Please read over the following fact sheets that you were given.

## 2023-02-11 NOTE — Progress Notes (Signed)
ray

## 2023-02-12 DIAGNOSIS — R519 Headache, unspecified: Secondary | ICD-10-CM | POA: Diagnosis not present

## 2023-02-12 DIAGNOSIS — R439 Unspecified disturbances of smell and taste: Secondary | ICD-10-CM | POA: Diagnosis not present

## 2023-02-12 DIAGNOSIS — R059 Cough, unspecified: Secondary | ICD-10-CM | POA: Diagnosis not present

## 2023-02-12 DIAGNOSIS — Z20822 Contact with and (suspected) exposure to covid-19: Secondary | ICD-10-CM | POA: Diagnosis not present

## 2023-02-14 ENCOUNTER — Telehealth: Payer: Self-pay | Admitting: Orthopaedic Surgery

## 2023-02-14 ENCOUNTER — Other Ambulatory Visit: Payer: Self-pay

## 2023-02-14 ENCOUNTER — Encounter (HOSPITAL_COMMUNITY)
Admission: RE | Admit: 2023-02-14 | Discharge: 2023-02-14 | Disposition: A | Discharge status: Home / Self Care | Payer: PPO | Source: Ambulatory Visit | Attending: Orthopaedic Surgery | Admitting: Orthopaedic Surgery

## 2023-02-14 ENCOUNTER — Encounter (HOSPITAL_COMMUNITY): Payer: Self-pay

## 2023-02-14 VITALS — BP 133/74 | HR 50 | Temp 98.4°F | Resp 17 | Ht 76.0 in | Wt 250.0 lb

## 2023-02-14 DIAGNOSIS — Z01812 Encounter for preprocedural laboratory examination: Secondary | ICD-10-CM | POA: Diagnosis not present

## 2023-02-14 DIAGNOSIS — Z01818 Encounter for other preprocedural examination: Secondary | ICD-10-CM | POA: Diagnosis present

## 2023-02-14 DIAGNOSIS — M1712 Unilateral primary osteoarthritis, left knee: Secondary | ICD-10-CM

## 2023-02-14 HISTORY — DX: Unspecified osteoarthritis, unspecified site: M19.90

## 2023-02-14 LAB — BASIC METABOLIC PANEL
Anion gap: 8 (ref 5–15)
BUN: 16 mg/dL (ref 8–23)
CO2: 28 mmol/L (ref 22–32)
Calcium: 9.8 mg/dL (ref 8.9–10.3)
Chloride: 104 mmol/L (ref 98–111)
Creatinine, Ser: 1.19 mg/dL (ref 0.61–1.24)
GFR, Estimated: >60 mL/min (ref >60)
Glucose, Bld: 133 mg/dL — ABNORMAL HIGH (ref 70–99)
Potassium: 3.5 mmol/L (ref 3.5–5.1)
Sodium: 140 mmol/L (ref 135–145)

## 2023-02-14 LAB — CBC
HCT: 39 % (ref 39.0–52.0)
Hemoglobin: 12 g/dL — ABNORMAL LOW (ref 13.0–17.0)
MCH: 26 pg (ref 26.0–34.0)
MCHC: 30.8 g/dL (ref 30.0–36.0)
MCV: 84.6 fL (ref 80.0–100.0)
Platelets: 228 10*3/uL (ref 150–400)
RBC: 4.61 MIL/uL (ref 4.22–5.81)
RDW: 14.3 % (ref 11.5–15.5)
WBC: 6.1 10*3/uL (ref 4.0–10.5)
nRBC: 0 % (ref 0.0–0.2)

## 2023-02-14 LAB — GLUCOSE, CAPILLARY: Glucose-Capillary: 133 mg/dL — ABNORMAL HIGH (ref 70–99)

## 2023-02-14 LAB — SURGICAL PCR SCREEN
MRSA, PCR: NEGATIVE
Staphylococcus aureus: NEGATIVE

## 2023-02-14 NOTE — Telephone Encounter (Signed)
He doesn't have to stop it.

## 2023-02-14 NOTE — Telephone Encounter (Signed)
Pt has upcoming surgery POST OP/L KNEE UNI vs TKA (surgery date 02-28-23)  would like to know when he should stop taking Asprin

## 2023-02-14 NOTE — Progress Notes (Signed)
PCP - Charolette Nche Cardiologist - Donato Schultz  PPM/ICD - denies Device Orders - n/a Rep Notified - n/a  Chest x-ray - n/a EKG - 12-21-22 Stress Test - 05-19-14 ECHO - 01-11-23 Cardiac Cath -   Sleep Study - 10 + plus years ago CPAP - no longer does since surgery on uvala  Fasting Blood Sugar - 133 At PAT appointment Checks Blood Sugar does no check blood sugar at home regularly.  MD monitors A1C.(12-31-22 A1C 6.4)   Blood Thinner Instructions:denies Aspirin Instructions: Instructed pt. To contact surgeons office for instructions regarding ASA.  ERAS Protcol - clear liquids until 9:25  PRE-SURGERY G2-   COVID TEST- Recently had covid Sept 8th. Denies any symptoms at this time. Received Flu and Covid vaccine 01-25-23.  Patient taking amoxicllin was going to have a dental procedure but cancelled due to covid. Last dose will be 02-18-23  Anesthesia review: EKG review 12-21-22  Patient denies shortness of breath, fever, cough and chest pain at PAT appointment   All instructions explained to the patient, with a verbal understanding of the material. Patient agrees to go over the instructions while at home for a better understanding. Patient also instructed to self quarantine after being tested for COVID-19. The opportunity to ask questions was provided.

## 2023-02-15 ENCOUNTER — Encounter (HOSPITAL_COMMUNITY): Payer: Self-pay

## 2023-02-15 DIAGNOSIS — Z20822 Contact with and (suspected) exposure to covid-19: Secondary | ICD-10-CM | POA: Diagnosis not present

## 2023-02-15 DIAGNOSIS — R439 Unspecified disturbances of smell and taste: Secondary | ICD-10-CM | POA: Diagnosis not present

## 2023-02-15 DIAGNOSIS — R519 Headache, unspecified: Secondary | ICD-10-CM | POA: Diagnosis not present

## 2023-02-15 DIAGNOSIS — R059 Cough, unspecified: Secondary | ICD-10-CM | POA: Diagnosis not present

## 2023-02-15 NOTE — Telephone Encounter (Signed)
Notified patient.

## 2023-02-15 NOTE — Anesthesia Preprocedure Evaluation (Addendum)
Anesthesia Evaluation  Patient identified by MRN, date of birth, ID band Patient awake    Reviewed: Allergy & Precautions, NPO status , Patient's Chart, lab work & pertinent test results, reviewed documented beta blocker date and time   History of Anesthesia Complications Negative for: history of anesthetic complications  Airway Mallampati: II  TM Distance: >3 FB Neck ROM: Full    Dental  (+) Edentulous Upper, Missing,    Pulmonary former smoker   Pulmonary exam normal        Cardiovascular hypertension, Pt. on medications and Pt. on home beta blockers + CAD  Normal cardiovascular exam     Neuro/Psych Seizures -,     GI/Hepatic Neg liver ROS,GERD  Medicated and Controlled,,  Endo/Other  diabetes, Type 2, Insulin Dependent    Renal/GU negative Renal ROS     Musculoskeletal  (+) Arthritis ,    Abdominal   Peds  Hematology negative hematology ROS (+)   Anesthesia Other Findings Day of surgery medications reviewed with patient.  Reproductive/Obstetrics                              Anesthesia Physical Anesthesia Plan  ASA: 2  Anesthesia Plan: Spinal   Post-op Pain Management:  Regional for Post-op pain and Tylenol PO (pre-op)* and Regional block*   Induction:   PONV Risk Score and Plan: 2 and Treatment may vary due to age or medical condition, Ondansetron, Propofol infusion and Dexamethasone  Airway Management Planned: Natural Airway and Simple Face Mask  Additional Equipment: None  Intra-op Plan:   Post-operative Plan:   Informed Consent: I have reviewed the patients History and Physical, chart, labs and discussed the procedure including the risks, benefits and alternatives for the proposed anesthesia with the patient or authorized representative who has indicated his/her understanding and acceptance.       Plan Discussed with: CRNA  Anesthesia Plan Comments: (PAT note  written 02/15/2023 by Shonna Chock, PA-C.  )        Anesthesia Quick Evaluation

## 2023-02-15 NOTE — Progress Notes (Signed)
Anesthesia Chart Review:  Case: [id number] Date/Time: 02/28/23 1209   Procedure: LEFT UNICOMPARTMENTAL KNEE vs TOTAL KNEE ARTHROPLASTY (Left: Knee)   Anesthesia type: Spinal   Pre-op diagnosis: left knee osteoarthritis   [facility]: [facility] / [facility]   Surgeons: Tarry Kos, MD       DISCUSSION: Patient is a 77 year old male scheduled for the above procedure.  History includes former smoker, DM2, seizure disorder, GERD, OSA (no longer uses CPAP since UPPP), osteoarthritis, right renal lesion (followed by PCP and urology). COVID-19 01/30/23. Received Flu/Covid vaccines 01/25/23.  Last cardiology evaluation was on 12/21/22 with Eliberto Ivory, PA-C. She wrote, "Preop clearance for either partial knee or total knee replacement 02/28/23 by Dr. Roda Shutters. We haven't received clearance request but can clear him today. He is very active doing ballroom dancing several days a week and METS>6. He has no cardiac symptoms. Will update echo for dilated aortic root. He can proceed with above surgery without further cardiac testing.  According to the Revised Cardiac Risk Index (RCRI), his Perioperative Risk of Major Cardiac Event is (%): 0.9   His Functional Capacity in METs is: 6.79 according to the Duke Activity Status Index (DASI).    CAD with calcium score 151 and mild CAD distal LM, mLAD and mRCA CCTA 05/2017. No chest pain. On toprol 25 mg and ASA 81 mg daily.   Aortic root dilatation 44 mm 11/2020 43 mm on echo 01/06/22-update echo". 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   He was on amoxicillin for a "root canal" which never happened because he tested positive for COVID-19 on 01/30/23, so surgery was cancelled. He is still completing the course of amoxicillin, last dose planned for  02/18/23. I will send Dr. Roda Shutters and message, although patient believes he is aware.   Anesthesia team to evaluate on the day of surgery.    VS: BP 133/74   Pulse (!) 50   Temp 36.9 C   Resp 17   Ht 6\' 4"  (1.93 m)   Wt 113.4 kg   SpO2 98%   BMI 30.43 kg/m    PROVIDERS: Nche, Bonna Gains, NP is PCP  Donato Schultz, MD is cardiologist Rhoderick Moody, MD is urologist    LABS: Labs reviewed: Acceptable for surgery. LFTs normal, A1c 6.4% 12/31/22.  (all labs ordered are listed, but only abnormal results are displayed)  Labs Reviewed  GLUCOSE, CAPILLARY - Abnormal; Notable for the following components:      Result Value   Glucose-Capillary 133 (*)    All other components within normal limits  CBC - Abnormal; Notable for the following components:   Hemoglobin 12.0 (*)    All other components within normal limits  BASIC METABOLIC PANEL - Abnormal; Notable for the following components:   Glucose, Bld 133 (*)    All other components within normal limits  SURGICAL PCR SCREEN    IMAGES: Xray Left Knee 02/10/23: X-rays of the left knee show advanced degenerative joint disease.   Bone-on-bone joint space narrowing.   MRI Abd 08/15/22: IMPRESSION: 1. No significant interval change in a complex lesion of the lateral midportion of the right kidney measuring 1.2 x 1.1 cm. Although unchanged in the immediate interval, this appears to have slowly enlarged in comparison  to prior examinations dating back to 07/22/2020, at which time it measured no greater than 0.7 cm. This lesion again appears to demonstrate some internal complexity and gradual internal contrast enhancement, particularly on delayed phase images. 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 2. No evidence of lymphadenopathy or metastatic disease in the abdomen.   EKG: 12/21/22: Ventricular rate 47 bpm Sinus bradycardia  with 1st degree A-V block Left axis deviation Nonspecific ST abnormality Confirmed by Jacolyn Reedy [phone]) on 12/21/2022 8:14:35 AM   CV: Echo 01/11/23: IMPRESSIONS   1. Severe Left ventricular hypertrophy in the septal wall with moderate  hypertrophy in the posterior wall. Left ventricular ejection fraction, by  estimation, is 60 to 65%. The left ventricle has normal function. The left  ventricle has no regional wall  motion abnormalities. Left ventricular diastolic parameters are  indeterminate.   2. Right ventricular systolic function is normal. The right ventricular  size is normal.   3. The mitral valve is myxomatous. Trivial mitral valve regurgitation. No  evidence of mitral stenosis.   4. The aortic valve is normal in structure. Aortic valve regurgitation is  not visualized. No aortic stenosis is present.   5. Aneurysm of the aortic root, measuring 46 mm. There is mild dilatation  of the ascending aorta, measuring 37 mm.   6. The inferior vena cava is normal in size with greater than 50%  respiratory variability, suggesting right atrial pressure of 3 mmHg.    CT Coronary 06/22/17: - Aorta: Normal size. Minimal calcifications and atherosclerotic plaque. No dissection. - Aortic Valve: Trileaflet. No calcifications. Dilated aortic root at the sinus level with maximum diameter 44 mm.  Coronary Arteries:  Normal coronary origin.  Right dominance. - RCA is a large dominant artery that gives rise to PDA and PLVB. There is mild calcified plaque in the mid RCA with associated stenosis 25-50%. - Left main is a large and long vessel that has mild eccentric calcified plaque in the distal portion with associated stenosis 0-25%. LM gives rise to LAD and LCX arteries. - LAD is a large vessel that has mild diffuse predominantly calcified plaque in the proximal to mid portion with maximum stenosis 25-50%. LAD gives rise to two fairly small diagonal arteries. - D1 has minimal  plaque. - D2 has minimal plaque. - LCX is a non-dominant artery that gives rise to one OM1 branch. There is minimal non-calcified plaque.   Other findings: Normal pulmonary vein drainage into the left atrium. Normal let atrial appendage without a thrombus. Normal size of the pulmonary artery.   IMPRESSION: 1. Coronary calcium score of 151. This was 61 percentile for age and sex matched control. 2. Normal coronary origin with right dominance. 3. Mild CAD in the distal left main, proximal to mid LAD and mid RCA. Aggressive risk factor modification is recommended. 4. Dilated aortic root at the sinus level with maximum diameter 44 mm.   ETT 05/19/17: Blood pressure demonstrated a normal response to exercise. There was 1mm of horizontal ST depression in the inferolateral leads at peak exercise and in recovery. The patient achieved 7.2 mets and 85% PMHR. Mildly impaired exercise capacity. Consider nuclear stress test for further evaluation.   Past Medical History:  Diagnosis Date   Allergic rhinitis 05/07/2014   Arthritis    Diabetes mellitus without complication (HCC)    Dizziness 01/02/2016   GERD (gastroesophageal reflux disease)    Headache 03/13/2015  Kidney lesion 07/22/2020   right, followed by urology   Left flank pain 01/02/2016   Low back pain 06/06/2015   Pain of left side of body 08/13/2015   Post herpetic neuralgia    Routine general medical examination at a health care facility 05/07/2014   Seizure disorder (HCC) 05/07/2014   Seizures (HCC)    Last seizure in the 70s.   Shingles    Stye 05/07/2014    Past Surgical History:  Procedure Laterality Date   COLONOSCOPY     KNEE SURGERY Left    left miniscus   UPPER GASTROINTESTINAL ENDOSCOPY     uvala surgery      MEDICATIONS:  acetaminophen (TYLENOL) 500 MG tablet   acyclovir cream (ZOVIRAX) 5 %   amoxicillin (AMOXIL) 500 MG capsule   aspirin EC 81 MG tablet   atorvastatin (LIPITOR) 20 MG tablet    bacitracin-neomycin-polymyxin-hydrocortisone (CORTISPORIN) 1 % ophthalmic ointment   Cholecalciferol (VITAMIN D-3) 125 MCG (5000 UT) TABS   clotrimazole (LOTRIMIN) 1 % cream   divalproex (DEPAKOTE ER) 500 MG 24 hr tablet   gabapentin (NEURONTIN) 300 MG capsule   glipiZIDE (GLUCOTROL XL) 5 MG 24 hr tablet   ibuprofen (ADVIL) 800 MG tablet   ipratropium (ATROVENT) 0.03 % nasal spray   ketoconazole (NIZORAL) 2 % shampoo   meclizine (ANTIVERT) 25 MG tablet   metoprolol succinate (TOPROL-XL) 25 MG 24 hr tablet   omeprazole (PRILOSEC) 20 MG capsule   Probiotic Product (PROBIOTIC PO)   No current facility-administered medications for this encounter.    Shonna Chock, PA-C Surgical Short Stay/Anesthesiology [facility] Phone [phone] [facility] Phone [phone] 02/15/2023 6:11 PM

## 2023-02-19 DIAGNOSIS — R439 Unspecified disturbances of smell and taste: Secondary | ICD-10-CM | POA: Diagnosis not present

## 2023-02-19 DIAGNOSIS — R519 Headache, unspecified: Secondary | ICD-10-CM | POA: Diagnosis not present

## 2023-02-19 DIAGNOSIS — Z20822 Contact with and (suspected) exposure to covid-19: Secondary | ICD-10-CM | POA: Diagnosis not present

## 2023-02-19 DIAGNOSIS — R059 Cough, unspecified: Secondary | ICD-10-CM | POA: Diagnosis not present

## 2023-02-22 ENCOUNTER — Other Ambulatory Visit: Payer: Self-pay | Admitting: Physician Assistant

## 2023-02-22 MED ORDER — DOXYCYCLINE HYCLATE 100 MG PO CAPS: 100.0000 mg | ORAL_CAPSULE | Freq: Two times a day (BID) | ORAL | 0 refills | Status: DC

## 2023-02-22 MED ORDER — METHOCARBAMOL 750 MG PO TABS: 750.0000 mg | ORAL_TABLET | Freq: Two times a day (BID) | ORAL | 2 refills | Status: DC | PRN

## 2023-02-22 MED ORDER — DOCUSATE SODIUM 100 MG PO CAPS: 100.0000 mg | ORAL_CAPSULE | Freq: Every day | ORAL | 2 refills | Status: DC | PRN

## 2023-02-22 MED ORDER — OXYCODONE-ACETAMINOPHEN 5-325 MG PO TABS
1.0000 | ORAL_TABLET | Freq: Four times a day (QID) | ORAL | 0 refills | Status: DC | PRN
Start: 2023-02-22 — End: 2023-03-07

## 2023-02-22 MED ORDER — ONDANSETRON HCL 4 MG PO TABS: 4.0000 mg | ORAL_TABLET | Freq: Three times a day (TID) | ORAL | 0 refills | Status: DC | PRN

## 2023-02-22 MED ORDER — ASPIRIN 81 MG PO TBEC: 81.0000 mg | DELAYED_RELEASE_TABLET | Freq: Two times a day (BID) | ORAL | 0 refills | Status: AC

## 2023-02-23 DIAGNOSIS — R059 Cough, unspecified: Secondary | ICD-10-CM | POA: Diagnosis not present

## 2023-02-23 DIAGNOSIS — Z20822 Contact with and (suspected) exposure to covid-19: Secondary | ICD-10-CM | POA: Diagnosis not present

## 2023-02-23 DIAGNOSIS — R519 Headache, unspecified: Secondary | ICD-10-CM | POA: Diagnosis not present

## 2023-02-23 DIAGNOSIS — R439 Unspecified disturbances of smell and taste: Secondary | ICD-10-CM | POA: Diagnosis not present

## 2023-02-24 ENCOUNTER — Telehealth: Payer: Self-pay | Admitting: *Deleted

## 2023-02-24 ENCOUNTER — Other Ambulatory Visit: Payer: Self-pay | Admitting: *Deleted

## 2023-02-24 DIAGNOSIS — M1712 Unilateral primary osteoarthritis, left knee: Secondary | ICD-10-CM

## 2023-02-24 NOTE — Telephone Encounter (Signed)
[facility] pre-op call completed. 

## 2023-02-24 NOTE — Care Plan (Signed)
[facility] RNCM call to patient to discuss his upcoming Left uni-knee replacement with Dr. Roda Shutters on 02/28/23. He is an Ortho bundle patient and is agreeable to case management. He lives with his spouse, who will be able to assist him at home after discharge. He will need a RW after surgery. No CPM provided as insurance does not cover for uni-compartmental knee replacement, just total. Anticipate HHPT will be needed after a short hospital stay. Referral made to [facility] after choice provided. Reviewed all post op care instructions and will continue to follow for needs.

## 2023-02-25 MED ORDER — TRANEXAMIC ACID 1000 MG/10ML IV SOLN
2000.0000 mg | INTRAVENOUS | Status: DC
  Filled 2023-02-25: qty 20

## 2023-02-26 DIAGNOSIS — R519 Headache, unspecified: Secondary | ICD-10-CM | POA: Diagnosis not present

## 2023-02-26 DIAGNOSIS — R439 Unspecified disturbances of smell and taste: Secondary | ICD-10-CM | POA: Diagnosis not present

## 2023-02-26 DIAGNOSIS — R059 Cough, unspecified: Secondary | ICD-10-CM | POA: Diagnosis not present

## 2023-02-26 DIAGNOSIS — Z20822 Contact with and (suspected) exposure to covid-19: Secondary | ICD-10-CM | POA: Diagnosis not present

## 2023-02-28 ENCOUNTER — Encounter (HOSPITAL_COMMUNITY): Payer: Self-pay | Admitting: Orthopaedic Surgery

## 2023-02-28 ENCOUNTER — Encounter (HOSPITAL_COMMUNITY)
Admission: RE | Disposition: A | Discharge status: Home / Self Care | Payer: Self-pay | Source: Home / Self Care | Attending: Orthopaedic Surgery

## 2023-02-28 ENCOUNTER — Observation Stay (HOSPITAL_COMMUNITY): Payer: PPO

## 2023-02-28 ENCOUNTER — Observation Stay (HOSPITAL_COMMUNITY)
Admission: RE | Admit: 2023-02-28 | Discharge: 2023-03-01 | Disposition: A | Discharge status: Home / Self Care | Payer: PPO | Attending: Orthopaedic Surgery | Admitting: Orthopaedic Surgery

## 2023-02-28 ENCOUNTER — Other Ambulatory Visit: Payer: Self-pay

## 2023-02-28 ENCOUNTER — Observation Stay (HOSPITAL_COMMUNITY): Payer: PPO | Admitting: Vascular Surgery

## 2023-02-28 ENCOUNTER — Observation Stay (HOSPITAL_COMMUNITY): Payer: PPO | Admitting: Anesthesiology

## 2023-02-28 DIAGNOSIS — R609 Edema, unspecified: Secondary | ICD-10-CM | POA: Diagnosis not present

## 2023-02-28 DIAGNOSIS — Z7982 Long term (current) use of aspirin: Secondary | ICD-10-CM | POA: Insufficient documentation

## 2023-02-28 DIAGNOSIS — M1712 Unilateral primary osteoarthritis, left knee: Secondary | ICD-10-CM

## 2023-02-28 DIAGNOSIS — E119 Type 2 diabetes mellitus without complications: Secondary | ICD-10-CM | POA: Diagnosis not present

## 2023-02-28 DIAGNOSIS — Z87891 Personal history of nicotine dependence: Secondary | ICD-10-CM | POA: Diagnosis not present

## 2023-02-28 DIAGNOSIS — Z79899 Other long term (current) drug therapy: Secondary | ICD-10-CM | POA: Diagnosis not present

## 2023-02-28 DIAGNOSIS — Z7984 Long term (current) use of oral hypoglycemic drugs: Secondary | ICD-10-CM | POA: Diagnosis not present

## 2023-02-28 DIAGNOSIS — Z96652 Presence of left artificial knee joint: Secondary | ICD-10-CM | POA: Diagnosis not present

## 2023-02-28 DIAGNOSIS — G8918 Other acute postprocedural pain: Secondary | ICD-10-CM | POA: Diagnosis not present

## 2023-02-28 HISTORY — PX: TOTAL KNEE ARTHROPLASTY: SHX125

## 2023-02-28 LAB — GLUCOSE, CAPILLARY
Glucose-Capillary: 120 mg/dL — ABNORMAL HIGH (ref 70–99)
Glucose-Capillary: 123 mg/dL — ABNORMAL HIGH (ref 70–99)
Glucose-Capillary: 96 mg/dL (ref 70–99)
Glucose-Capillary: 100 mg/dL — ABNORMAL HIGH (ref 70–99)
Glucose-Capillary: 151 mg/dL — ABNORMAL HIGH (ref 70–99)

## 2023-02-28 SURGERY — ARTHROPLASTY, KNEE, TOTAL
Anesthesia: Spinal | Site: Knee | Laterality: Left

## 2023-02-28 MED ORDER — TRANEXAMIC ACID 1000 MG/10ML IV SOLN
INTRAVENOUS | Status: DC | PRN
  Administered 2023-02-28: 2000 mg via TOPICAL

## 2023-02-28 MED ORDER — FERROUS SULFATE 325 (65 FE) MG PO TABS
325.0000 mg | ORAL_TABLET | Freq: Three times a day (TID) | ORAL | Status: DC
  Administered 2023-02-28 – 2023-03-01 (×3): 325 mg via ORAL
  Filled 2023-02-28 (×3): qty 1

## 2023-02-28 MED ORDER — DEXAMETHASONE SODIUM PHOSPHATE 10 MG/ML IJ SOLN
INTRAMUSCULAR | Status: AC
  Filled 2023-02-28: qty 1

## 2023-02-28 MED ORDER — ONDANSETRON HCL 4 MG/2ML IJ SOLN
INTRAMUSCULAR | Status: DC | PRN
  Administered 2023-02-28: 4 mg via INTRAVENOUS

## 2023-02-28 MED ORDER — INSULIN ASPART 100 UNIT/ML IJ SOLN: 0.0000 [IU] | INTRAMUSCULAR | Status: DC | PRN

## 2023-02-28 MED ORDER — FENTANYL CITRATE (PF) 100 MCG/2ML IJ SOLN: 50.0000 ug | Freq: Once | INTRAMUSCULAR | Status: AC

## 2023-02-28 MED ORDER — DIVALPROEX SODIUM ER 500 MG PO TB24
500.0000 mg | ORAL_TABLET | Freq: Every day | ORAL | Status: DC
  Administered 2023-02-28: 500 mg via ORAL
  Filled 2023-02-28 (×2): qty 1

## 2023-02-28 MED ORDER — OXYCODONE HCL 5 MG PO TABS: 10.0000 mg | ORAL_TABLET | ORAL | Status: DC | PRN

## 2023-02-28 MED ORDER — PHENOL 1.4 % MT LIQD: 1.0000 | OROMUCOSAL | Status: DC | PRN

## 2023-02-28 MED ORDER — BUPIVACAINE-MELOXICAM ER 400-12 MG/14ML IJ SOLN
INTRAMUSCULAR | Status: DC | PRN
  Administered 2023-02-28: 400 mg

## 2023-02-28 MED ORDER — DOCUSATE SODIUM 100 MG PO CAPS
100.0000 mg | ORAL_CAPSULE | Freq: Two times a day (BID) | ORAL | Status: DC
  Administered 2023-02-28 – 2023-03-01 (×2): 100 mg via ORAL
  Filled 2023-02-28 (×2): qty 1

## 2023-02-28 MED ORDER — SODIUM CHLORIDE 0.9 % IV SOLN: INTRAVENOUS | Status: DC

## 2023-02-28 MED ORDER — MIDAZOLAM HCL 2 MG/2ML IJ SOLN
INTRAMUSCULAR | Status: AC
  Administered 2023-02-28: 1 mg via INTRAVENOUS
  Filled 2023-02-28: qty 2

## 2023-02-28 MED ORDER — MENTHOL 3 MG MT LOZG: 1.0000 | LOZENGE | OROMUCOSAL | Status: DC | PRN

## 2023-02-28 MED ORDER — DEXAMETHASONE SODIUM PHOSPHATE 10 MG/ML IJ SOLN
10.0000 mg | Freq: Once | INTRAMUSCULAR | Status: AC
  Administered 2023-03-01: 10 mg via INTRAVENOUS
  Filled 2023-02-28: qty 1

## 2023-02-28 MED ORDER — CEFAZOLIN SODIUM-DEXTROSE 2-4 GM/100ML-% IV SOLN
2.0000 g | Freq: Four times a day (QID) | INTRAVENOUS | Status: AC
  Administered 2023-02-28: 2 g via INTRAVENOUS
  Filled 2023-02-28 (×2): qty 100

## 2023-02-28 MED ORDER — DEXAMETHASONE SODIUM PHOSPHATE 10 MG/ML IJ SOLN
INTRAMUSCULAR | Status: DC | PRN
  Administered 2023-02-28: 5 mg via INTRAVENOUS

## 2023-02-28 MED ORDER — FENTANYL CITRATE (PF) 100 MCG/2ML IJ SOLN
INTRAMUSCULAR | Status: AC
  Administered 2023-02-28: 50 ug
  Filled 2023-02-28: qty 2

## 2023-02-28 MED ORDER — FENTANYL CITRATE (PF) 250 MCG/5ML IJ SOLN
INTRAMUSCULAR | Status: AC
  Filled 2023-02-28: qty 5

## 2023-02-28 MED ORDER — PRONTOSAN WOUND IRRIGATION OPTIME
TOPICAL | Status: DC | PRN
  Administered 2023-02-28: 1 via TOPICAL

## 2023-02-28 MED ORDER — CEFAZOLIN SODIUM-DEXTROSE 2-4 GM/100ML-% IV SOLN
2.0000 g | INTRAVENOUS | Status: AC
  Administered 2023-02-28: 2 g via INTRAVENOUS

## 2023-02-28 MED ORDER — TRANEXAMIC ACID-NACL 1000-0.7 MG/100ML-% IV SOLN
1000.0000 mg | INTRAVENOUS | Status: AC
  Administered 2023-02-28: 1000 mg via INTRAVENOUS
  Filled 2023-02-28: qty 100

## 2023-02-28 MED ORDER — FENTANYL CITRATE (PF) 100 MCG/2ML IJ SOLN: 25.0000 ug | INTRAMUSCULAR | Status: DC | PRN

## 2023-02-28 MED ORDER — METOCLOPRAMIDE HCL 5 MG/ML IJ SOLN: 5.0000 mg | Freq: Three times a day (TID) | INTRAMUSCULAR | Status: DC | PRN

## 2023-02-28 MED ORDER — VANCOMYCIN HCL 1000 MG IV SOLR
INTRAVENOUS | Status: DC | PRN
  Administered 2023-02-28: 1000 mg via TOPICAL

## 2023-02-28 MED ORDER — CHLORHEXIDINE GLUCONATE 0.12 % MT SOLN
15.0000 mL | OROMUCOSAL | Status: AC
  Administered 2023-02-28: 15 mL via OROMUCOSAL
  Filled 2023-02-28: qty 15

## 2023-02-28 MED ORDER — INSULIN ASPART 100 UNIT/ML IJ SOLN
0.0000 [IU] | Freq: Three times a day (TID) | INTRAMUSCULAR | Status: DC
  Administered 2023-03-01: 3 [IU] via SUBCUTANEOUS

## 2023-02-28 MED ORDER — CEFAZOLIN SODIUM-DEXTROSE 2-4 GM/100ML-% IV SOLN
INTRAVENOUS | Status: AC
  Administered 2023-03-01: 2 g via INTRAVENOUS
  Filled 2023-02-28: qty 100

## 2023-02-28 MED ORDER — OXYCODONE HCL 5 MG PO TABS: 5.0000 mg | ORAL_TABLET | Freq: Once | ORAL | Status: DC | PRN

## 2023-02-28 MED ORDER — BUPIVACAINE-EPINEPHRINE (PF) 0.5% -1:200000 IJ SOLN
INTRAMUSCULAR | Status: DC | PRN
Start: 2023-02-28 — End: 2023-02-28
  Administered 2023-02-28: 15 mL via PERINEURAL

## 2023-02-28 MED ORDER — BUPIVACAINE IN DEXTROSE 0.75-8.25 % IT SOLN
INTRATHECAL | Status: DC | PRN
Start: 2023-02-28 — End: 2023-02-28
  Administered 2023-02-28: 1.8 mL via INTRATHECAL

## 2023-02-28 MED ORDER — ACETAMINOPHEN 325 MG PO TABS: 325.0000 mg | ORAL_TABLET | Freq: Four times a day (QID) | ORAL | Status: DC | PRN

## 2023-02-28 MED ORDER — CHLORHEXIDINE GLUCONATE 0.12 % MT SOLN: 15.0000 mL | OROMUCOSAL | Status: AC

## 2023-02-28 MED ORDER — METOCLOPRAMIDE HCL 5 MG PO TABS: 5.0000 mg | ORAL_TABLET | Freq: Three times a day (TID) | ORAL | Status: DC | PRN

## 2023-02-28 MED ORDER — METHOCARBAMOL 500 MG PO TABS: 500.0000 mg | ORAL_TABLET | Freq: Four times a day (QID) | ORAL | Status: DC | PRN

## 2023-02-28 MED ORDER — ONDANSETRON HCL 4 MG/2ML IJ SOLN: 4.0000 mg | Freq: Four times a day (QID) | INTRAMUSCULAR | Status: DC | PRN

## 2023-02-28 MED ORDER — LACTATED RINGERS IV SOLN: INTRAVENOUS | Status: DC

## 2023-02-28 MED ORDER — OXYCODONE HCL 5 MG/5ML PO SOLN: 5.0000 mg | Freq: Once | ORAL | Status: DC | PRN

## 2023-02-28 MED ORDER — PHENYLEPHRINE HCL-NACL 20-0.9 MG/250ML-% IV SOLN
INTRAVENOUS | Status: DC | PRN
  Administered 2023-02-28: 50 ug/min via INTRAVENOUS

## 2023-02-28 MED ORDER — ASPIRIN 81 MG PO CHEW
81.0000 mg | CHEWABLE_TABLET | Freq: Two times a day (BID) | ORAL | Status: DC
  Administered 2023-02-28 – 2023-03-01 (×2): 81 mg via ORAL
  Filled 2023-02-28 (×2): qty 1

## 2023-02-28 MED ORDER — DOXYCYCLINE HYCLATE 100 MG PO TABS
100.0000 mg | ORAL_TABLET | Freq: Two times a day (BID) | ORAL | Status: DC
  Administered 2023-03-01: 100 mg via ORAL
  Filled 2023-02-28: qty 1

## 2023-02-28 MED ORDER — ACETAMINOPHEN 500 MG PO TABS
1000.0000 mg | ORAL_TABLET | Freq: Four times a day (QID) | ORAL | Status: AC
  Administered 2023-02-28 – 2023-03-01 (×4): 1000 mg via ORAL
  Filled 2023-02-28 (×4): qty 2

## 2023-02-28 MED ORDER — CLONIDINE HCL (ANALGESIA) 100 MCG/ML EP SOLN
EPIDURAL | Status: DC | PRN
Start: 2023-02-28 — End: 2023-02-28
  Administered 2023-02-28: 100 ug

## 2023-02-28 MED ORDER — TRANEXAMIC ACID-NACL 1000-0.7 MG/100ML-% IV SOLN
1000.0000 mg | Freq: Once | INTRAVENOUS | Status: AC
  Administered 2023-02-28: 1000 mg via INTRAVENOUS
  Filled 2023-02-28: qty 100

## 2023-02-28 MED ORDER — BUPIVACAINE-MELOXICAM ER 400-12 MG/14ML IJ SOLN
INTRAMUSCULAR | Status: AC
  Filled 2023-02-28: qty 1

## 2023-02-28 MED ORDER — PROPOFOL 500 MG/50ML IV EMUL
INTRAVENOUS | Status: DC | PRN
  Administered 2023-02-28: 50 ug/kg/min via INTRAVENOUS

## 2023-02-28 MED ORDER — ONDANSETRON HCL 4 MG/2ML IJ SOLN
INTRAMUSCULAR | Status: AC
  Filled 2023-02-28: qty 2

## 2023-02-28 MED ORDER — ONDANSETRON HCL 4 MG PO TABS: 4.0000 mg | ORAL_TABLET | Freq: Four times a day (QID) | ORAL | Status: DC | PRN

## 2023-02-28 MED ORDER — DROPERIDOL 2.5 MG/ML IJ SOLN: 0.6250 mg | Freq: Once | INTRAMUSCULAR | Status: DC | PRN

## 2023-02-28 MED ORDER — GLIPIZIDE ER 5 MG PO TB24
5.0000 mg | ORAL_TABLET | Freq: Every day | ORAL | Status: DC
  Administered 2023-03-01: 5 mg via ORAL
  Filled 2023-02-28: qty 1

## 2023-02-28 MED ORDER — METOPROLOL SUCCINATE ER 25 MG PO TB24
25.0000 mg | ORAL_TABLET | Freq: Every day | ORAL | Status: DC
  Administered 2023-03-01: 25 mg via ORAL
  Filled 2023-02-28: qty 1

## 2023-02-28 MED ORDER — VANCOMYCIN HCL 1000 MG IV SOLR
INTRAVENOUS | Status: AC
  Filled 2023-02-28: qty 20

## 2023-02-28 MED ORDER — FENTANYL CITRATE (PF) 250 MCG/5ML IJ SOLN
INTRAMUSCULAR | Status: DC | PRN
  Administered 2023-02-28: 50 ug via INTRAVENOUS

## 2023-02-28 MED ORDER — SODIUM CHLORIDE 0.9 % IR SOLN
Status: DC | PRN
  Administered 2023-02-28: 1000 mL

## 2023-02-28 MED ORDER — 0.9 % SODIUM CHLORIDE (POUR BTL) OPTIME
TOPICAL | Status: DC | PRN
  Administered 2023-02-28: 1000 mL

## 2023-02-28 MED ORDER — KETOROLAC TROMETHAMINE 15 MG/ML IJ SOLN
7.5000 mg | Freq: Four times a day (QID) | INTRAMUSCULAR | Status: DC
  Administered 2023-02-28 – 2023-03-01 (×3): 7.5 mg via INTRAVENOUS
  Filled 2023-02-28 (×4): qty 1

## 2023-02-28 MED ORDER — HYDROMORPHONE HCL 1 MG/ML IJ SOLN: 0.5000 mg | INTRAMUSCULAR | Status: DC | PRN

## 2023-02-28 MED ORDER — MIDAZOLAM HCL 2 MG/2ML IJ SOLN: 1.0000 mg | Freq: Once | INTRAMUSCULAR | Status: AC

## 2023-02-28 MED ORDER — OXYCODONE HCL 5 MG PO TABS
5.0000 mg | ORAL_TABLET | ORAL | Status: DC | PRN
  Administered 2023-02-28: 10 mg via ORAL
  Filled 2023-02-28: qty 2

## 2023-02-28 MED ORDER — METHOCARBAMOL 1000 MG/10ML IJ SOLN: 500.0000 mg | Freq: Four times a day (QID) | INTRAVENOUS | Status: DC | PRN

## 2023-02-28 MED ORDER — POVIDONE-IODINE 10 % EX SWAB: 2.0000 | Freq: Once | CUTANEOUS | Status: DC

## 2023-02-28 SURGICAL SUPPLY — 83 items
ADH SKN CLS APL DERMABOND .7 (GAUZE/BANDAGES/DRESSINGS) ×1
ALCOHOL 70% 16 OZ (MISCELLANEOUS) ×1 IMPLANT
BAG COUNTER SPONGE SURGICOUNT (BAG) IMPLANT
BAG DECANTER FOR FLEXI CONT (MISCELLANEOUS) ×1 IMPLANT
BAG SPNG CNTER NS LX DISP (BAG)
BANDAGE ESMARK 6X9 LF (GAUZE/BANDAGES/DRESSINGS) IMPLANT
BLADE SAG 18X100X1.27 (BLADE) ×1 IMPLANT
BLADE SAW SGTL 73X25 THK (BLADE) ×1 IMPLANT
BNDG CMPR 9X6 STRL LF SNTH (GAUZE/BANDAGES/DRESSINGS)
BNDG ESMARK 6X9 LF (GAUZE/BANDAGES/DRESSINGS)
BOWL SMART MIX CTS (DISPOSABLE) ×1 IMPLANT
CEMENT BONE REFOBACIN R1X40 US (Cement) IMPLANT
CLSR STERI-STRIP ANTIMIC 1/2X4 (GAUZE/BANDAGES/DRESSINGS) ×2 IMPLANT
COMP FEM CMT STD PS SZ12 LT (Joint) ×1 IMPLANT
COMPONENT FEM CMT STD  SZ12LT (Joint) IMPLANT
COOLER ICEMAN CLASSIC (MISCELLANEOUS) ×1 IMPLANT
COVER SURGICAL LIGHT HANDLE (MISCELLANEOUS) ×1 IMPLANT
CUFF TOURN SGL QUICK 34 (TOURNIQUET CUFF) ×1
CUFF TOURN SGL QUICK 42 (TOURNIQUET CUFF) IMPLANT
CUFF TRNQT CYL 34X4.125X (TOURNIQUET CUFF) ×1 IMPLANT
DERMABOND ADVANCED .7 DNX12 (GAUZE/BANDAGES/DRESSINGS) ×1 IMPLANT
DRAPE EXTREMITY T 121X128X90 (DISPOSABLE) ×1 IMPLANT
DRAPE HALF SHEET 40X57 (DRAPES) ×1 IMPLANT
DRAPE INCISE IOBAN 66X45 STRL (DRAPES) ×1 IMPLANT
DRAPE ORTHO SPLIT 77X108 STRL (DRAPES)
DRAPE POUCH INSTRU U-SHP 10X18 (DRAPES) ×1 IMPLANT
DRAPE SURG ORHT 6 SPLT 77X108 (DRAPES) IMPLANT
DRAPE U-SHAPE 47X51 STRL (DRAPES) ×2 IMPLANT
DRSG AQUACEL AG ADV 3.5X10 (GAUZE/BANDAGES/DRESSINGS) ×1 IMPLANT
DURAPREP 26ML APPLICATOR (WOUND CARE) ×3 IMPLANT
ELECT CAUTERY BLADE 6.4 (BLADE) ×1 IMPLANT
ELECT PENCIL ROCKER SW 15FT (MISCELLANEOUS) ×1 IMPLANT
ELECT REM PT RETURN 9FT ADLT (ELECTROSURGICAL) ×1
ELECTRODE REM PT RTRN 9FT ADLT (ELECTROSURGICAL) ×1 IMPLANT
GLOVE BIOGEL PI IND STRL 7.0 (GLOVE) ×2 IMPLANT
GLOVE BIOGEL PI IND STRL 7.5 (GLOVE) ×5 IMPLANT
GLOVE ECLIPSE 7.0 STRL STRAW (GLOVE) ×3 IMPLANT
GLOVE INDICATOR 7.0 STRL GRN (GLOVE) ×1 IMPLANT
GLOVE INDICATOR 7.5 STRL GRN (GLOVE) ×1 IMPLANT
GLOVE SS BIOGEL STRL SZ 8 (GLOVE) IMPLANT
GLOVE SURG SYN 7.5 E (GLOVE) ×4 IMPLANT
GLOVE SURG SYN 7.5 PF PI (GLOVE) ×2 IMPLANT
GLOVE SURG UNDER LTX SZ7.5 (GLOVE) ×2 IMPLANT
GLOVE SURG UNDER POLY LF SZ7 (GLOVE) ×2 IMPLANT
GOWN STRL REUS W/ TWL LRG LVL3 (GOWN DISPOSABLE) ×1 IMPLANT
GOWN STRL REUS W/TWL LRG LVL3 (GOWN DISPOSABLE) ×1
GOWN STRL SURGICAL XL XLNG (GOWN DISPOSABLE) ×1 IMPLANT
GOWN TOGA ZIPPER T7+ PEEL AWAY (MISCELLANEOUS) ×2 IMPLANT
HANDPIECE INTERPULSE COAX TIP (DISPOSABLE) ×1
HOOD PEEL AWAY T7 (MISCELLANEOUS) ×1 IMPLANT
INSERT TIB PS KEL 0 J LT (Insert) IMPLANT
KIT BASIN OR (CUSTOM PROCEDURE TRAY) ×1 IMPLANT
KIT TURNOVER KIT B (KITS) ×1 IMPLANT
LINER TIB MED PS J/12 12 LT (Liner) IMPLANT
MANIFOLD NEPTUNE II (INSTRUMENTS) ×1 IMPLANT
MARKER SKIN DUAL TIP RULER LAB (MISCELLANEOUS) ×2 IMPLANT
NDL SPNL 18GX3.5 QUINCKE PK (NEEDLE) ×1 IMPLANT
NEEDLE SPNL 18GX3.5 QUINCKE PK (NEEDLE) ×1 IMPLANT
NS IRRIG 1000ML POUR BTL (IV SOLUTION) ×1 IMPLANT
PACK TOTAL JOINT (CUSTOM PROCEDURE TRAY) ×1 IMPLANT
PAD ARMBOARD 7.5X6 YLW CONV (MISCELLANEOUS) ×2 IMPLANT
PAD COLD SHLDR WRAP-ON (PAD) ×1 IMPLANT
PIN DRILL HDLS TROCAR 75 4PK (PIN) IMPLANT
SCREW FEMALE HEX FIX 25X2.5 (ORTHOPEDIC DISPOSABLE SUPPLIES) IMPLANT
SET HNDPC FAN SPRY TIP SCT (DISPOSABLE) ×1 IMPLANT
SOLUTION PRONTOSAN WOUND 350ML (IRRIGATION / IRRIGATOR) ×1 IMPLANT
STAPLER VISISTAT 35W (STAPLE) IMPLANT
STEM POLY PAT PLY 38M KNEE (Knees) IMPLANT
SUCTION TUBE FRAZIER 10FR DISP (SUCTIONS) ×1 IMPLANT
SUT ETHILON 2 0 FS 18 (SUTURE) IMPLANT
SUT MNCRL AB 3-0 PS2 27 (SUTURE) IMPLANT
SUT VIC AB 0 CT1 27 (SUTURE) ×2
SUT VIC AB 0 CT1 27XBRD ANBCTR (SUTURE) ×2 IMPLANT
SUT VIC AB 1 CTX 27 (SUTURE) ×3 IMPLANT
SUT VIC AB 2-0 CT1 27 (SUTURE) ×4
SUT VIC AB 2-0 CT1 TAPERPNT 27 (SUTURE) ×4 IMPLANT
SYR 50ML LL SCALE MARK (SYRINGE) ×2 IMPLANT
TOWEL GREEN STERILE (TOWEL DISPOSABLE) ×1 IMPLANT
TOWEL GREEN STERILE FF (TOWEL DISPOSABLE) ×1 IMPLANT
TRAY CATH INTERMITTENT SS 16FR (CATHETERS) IMPLANT
TUBE SUCT ARGYLE STRL (TUBING) ×1 IMPLANT
UNDERPAD 30X36 HEAVY ABSORB (UNDERPADS AND DIAPERS) ×1 IMPLANT
YANKAUER SUCT BULB TIP NO VENT (SUCTIONS) ×2 IMPLANT

## 2023-02-28 NOTE — H&P (Signed)
PREOPERATIVE H&P  Chief Complaint: left knee osteoarthritis  HPI: Nicholas Rasmussen is a 77 y.o. male who presents for surgical treatment of left knee osteoarthritis.  He denies any changes in medical history.  Past Surgical History:  Procedure Laterality Date   COLONOSCOPY     KNEE SURGERY Left    left miniscus   UPPER GASTROINTESTINAL ENDOSCOPY     uvala surgery     Social History   Socioeconomic History   Marital status: Single    Spouse name: Not on file   Number of children: 3   Years of education: Associates   Highest education level: Not on file  Occupational History   Occupation: Retired    Comment: Printmaker  Tobacco Use   Smoking status: Former   Smokeless tobacco: Never  Advertising account planner   Vaping status: Never Used  Substance and Sexual Activity   Alcohol use: Yes    Alcohol/week: 0.0 standard drinks of alcohol    Comment: occasional   Drug use: No   Sexual activity: Not on file  Other Topics Concern   Not on file  Social History Narrative   ** Merged History Encounter **       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 Right-handed. Rarely drinks caffeine.    Social Determinants of Health   Financial Resource Strain: Low Risk  (06/04/2022)   Overall Financial Resource Strain (CARDIA)    Difficulty of Paying Living Expenses: Not hard at all  Food Insecurity: No Food Insecurity (06/04/2022)   Hunger Vital Sign    Worried About Running Out of Food in the Last Year: Never true    Ran Out of Food in the Last Year: Never true  Transportation Needs: No Transportation Needs (12/31/2022)   PRAPARE - Administrator, Civil Service (Medical): No    Lack of Transportation (Non-Medical): No  Physical Activity: Sufficiently Active (06/04/2022)   Exercise Vital Sign    Days of Exercise per Week: 3 days    Minutes of Exercise per Session: 150+ min  Stress: No  Stress Concern Present (06/04/2022)   [employer] of Occupational Health - Occupational Stress Questionnaire    Feeling of Stress : Not at all  Social Connections: Moderately Integrated (05/26/2021)   Social Connection and Isolation Panel [NHANES]    Frequency of Communication with Friends and Family: Twice a week    Frequency of Social Gatherings with Friends and Family: Twice a week    Attends Religious Services: More than 4 times per year    Active Member of [employer] or Organizations: No    Attends Engineer, structural: Never    Marital Status: Married   Family History  Problem Relation Age of Onset   Other Mother        natural causes   Diabetes Father    Hypertension Father    Kidney cancer Brother    Colon cancer Neg Hx    Stomach cancer Neg Hx    Esophageal cancer Neg Hx    Colon polyps Neg Hx    Allergies  Allergen Reactions   Jardiance [Empagliflozin] Other (See Comments)    Yeast infection   Prior to Admission medications   Medication Sig Start Date End Date Taking? Authorizing Provider  acetaminophen (TYLENOL) 500 MG tablet Take 1 tablet (500 mg total) by mouth every 8 (eight) hours as needed. 11/23/17  Yes Nche, Bonna Gains, NP  amoxicillin (AMOXIL) 500 MG capsule Take 500 mg by mouth every 8 (eight) hours. 02/07/23  Yes [provider]  aspirin EC 81 MG tablet Take 81 mg by mouth daily. Swallow whole.   Yes [provider]  aspirin EC 81 MG tablet Take 1 tablet (81 mg total) by mouth 2 (two) times daily. To be taken after surgery to prevent blood clots 02/22/23 02/22/24  Cristie Hem, PA-C  atorvastatin (LIPITOR) 20 MG tablet Take 1 tablet (20 mg total) by mouth every evening. 12/31/22  Yes Nche, Bonna Gains, NP  bacitracin-neomycin-polymyxin-hydrocortisone (CORTISPORIN) 1 % ophthalmic ointment Place 1 Application into the left eye at bedtime.   Yes [provider]  Cholecalciferol (VITAMIN D-3) 125 MCG (5000 UT) TABS Take 5,000  Units by mouth daily.   Yes [provider]  clotrimazole (LOTRIMIN) 1 % cream APPLY TO AFFECTED AREA TWICE A DAY Patient taking differently: Apply 1 Application topically daily as needed. 09/21/21  Yes Nche, Bonna Gains, NP  divalproex (DEPAKOTE ER) 500 MG 24 hr tablet Take 1 tablet (500 mg total) by mouth at bedtime. 12/31/22  Yes Nche, Bonna Gains, NP  docusate sodium (COLACE) 100 MG capsule Take 1 capsule (100 mg total) by mouth daily as needed. 02/22/23 02/22/24  Cristie Hem, PA-C  doxycycline (VIBRAMYCIN) 100 MG capsule Take 1 capsule (100 mg total) by mouth 2 (two) times daily. To be taken after surgery 02/22/23   Cristie Hem, PA-C  gabapentin (NEURONTIN) 300 MG capsule TAKE 1 CAPSULE BY MOUTH EVERYDAY AT BEDTIME 06/25/22  Yes Nche, Bonna Gains, NP  glipiZIDE (GLUCOTROL XL) 5 MG 24 hr tablet TAKE 1 TABLET BY MOUTH EVERY DAY WITH BREAKFAST 06/25/22  Yes Nche, Bonna Gains, NP  ibuprofen (ADVIL) 800 MG tablet Take 1 tablet (800 mg total) by mouth 3 (three) times daily. Patient taking differently: Take 800 mg by mouth as needed. 01/08/21  Yes White, Adrienne R, NP  ipratropium (ATROVENT) 0.03 % nasal spray Place 2 sprays into both nostrils daily.   Yes [provider]  ketoconazole (NIZORAL) 2 % shampoo APPLY 1 APPLICATION TOPICALLY 2 (TWO) TIMES A WEEK. Patient taking differently: Apply 1 Application topically once a week. 12/02/22  Yes Nche, Bonna Gains, NP  meclizine (ANTIVERT) 25 MG tablet Take 1 tablet (25 mg total) by mouth 3 (three) times daily as needed for dizziness. 12/02/22  Yes McElwee, Lauren A, NP  methocarbamol (ROBAXIN-750) 750 MG tablet Take 1 tablet (750 mg total) by mouth 2 (two) times daily as needed for muscle spasms. 02/22/23   Cristie Hem, PA-C  metoprolol succinate (TOPROL-XL) 25 MG 24 hr tablet TAKE 1 TABLET (25 MG TOTAL) BY MOUTH DAILY. 05/31/22  Yes Nche, Bonna Gains, NP  omeprazole (PRILOSEC) 20 MG capsule TAKE 1 CAPSULE BY MOUTH EVERY DAY AS  NEEDED Patient taking differently: Take 20 mg by mouth daily. 09/22/22  Yes Nche, Bonna Gains, NP  ondansetron (ZOFRAN) 4 MG tablet Take 1 tablet (4 mg total) by mouth every 8 (eight) hours as needed for nausea or vomiting. 02/22/23   Cristie Hem, PA-C  oxyCODONE-acetaminophen (PERCOCET) 5-325 MG tablet Take 1-2 tablets by mouth every 6 (six) hours as needed. To be taken after surgery 02/22/23   Cristie Hem, PA-C  Probiotic Product (PROBIOTIC PO) Take 1 tablet by mouth daily.   Yes [provider]  acyclovir cream (ZOVIRAX) 5 % Apply 1 Application topically every 6 (six) hours. Patient not  taking: Reported on 02/09/2023 11/01/22   Nche, Bonna Gains, NP     Positive ROS: All other systems have been reviewed and were otherwise negative with the exception of those mentioned in the HPI and as above.  Physical Exam: General: Alert, no acute distress Cardiovascular: No pedal edema Respiratory: No cyanosis, no use of accessory musculature GI: abdomen soft Skin: No lesions in the area of chief complaint Neurologic: Sensation intact distally Psychiatric: Patient is competent for consent with normal mood and affect Lymphatic: no lymphedema  MUSCULOSKELETAL: exam stable  Assessment: left knee osteoarthritis  Plan: Plan for Procedure(s): LEFT UNICOMPARTMENTAL KNEE vs TOTAL KNEE ARTHROPLASTY  The risks benefits and alternatives were discussed with the patient including but not limited to the risks of nonoperative treatment, versus surgical intervention including infection, bleeding, nerve injury,  blood clots, cardiopulmonary complications, morbidity, mortality, among others, and they were willing to proceed.   Glee Arvin, MD 02/28/2023 9:43 AM

## 2023-02-28 NOTE — Anesthesia Procedure Notes (Signed)
Spinal  Patient location during procedure: OR Start time: 02/28/2023 12:40 PM End time: 02/28/2023 12:43 PM Reason for block: surgical anesthesia Staffing Performed: anesthesiologist  Anesthesiologist: Kaylyn Layer, MD Performed by: Kaylyn Layer, MD Authorized by: Kaylyn Layer, MD   Preanesthetic Checklist Completed: patient identified, IV checked, risks and benefits discussed, surgical consent, monitors and equipment checked, pre-op evaluation and timeout performed Spinal Block Patient position: sitting Prep: DuraPrep and site prepped and draped Patient monitoring: continuous pulse ox, blood pressure and heart rate Approach: midline Location: L3-4 Injection technique: single-shot Needle Needle type: Pencan  Needle gauge: 24 G Needle length: 9 cm Assessment Events: CSF return Additional Notes Risks, benefits, and alternative discussed. Patient gave consent to procedure. Prepped and draped in sitting position. Patient sedated but responsive to voice. Clear CSF obtained after one needle pass. Positive terminal aspiration. No pain or paraesthesias with injection. Patient tolerated procedure well. Vital signs stable. Amalia Greenhouse, MD

## 2023-02-28 NOTE — Anesthesia Procedure Notes (Signed)
Anesthesia Regional Block: Adductor canal block   Pre-Anesthetic Checklist: , timeout performed,  Correct Patient, Correct Site, Correct Laterality,  Correct Procedure, Correct Position, site marked,  Risks and benefits discussed,  Pre-op evaluation,  At surgeon's request and post-op pain management  Laterality: Left  Prep: Maximum Sterile Barrier Precautions used, chloraprep       Needles:  Injection technique: Single-shot  Needle Type: Echogenic Stimulator Needle     Needle Length: 9cm  Needle Gauge: 22     Additional Needles:   Procedures:,,,, ultrasound used (permanent image in chart),,    Narrative:  Start time: 02/28/2023 12:06 PM End time: 02/28/2023 12:09 PM Injection made incrementally with aspirations every 5 mL.  Performed by: Personally  Anesthesiologist: Kaylyn Layer, MD  Additional Notes: Risks, benefits, and alternative discussed. Patient gave consent for procedure. Patient prepped and draped in sterile fashion. Sedation administered, patient remains easily responsive to voice. Relevant anatomy identified with ultrasound guidance. Local anesthetic given in 5cc increments with no signs or symptoms of intravascular injection. No pain or paraesthesias with injection. Patient monitored throughout procedure with signs of LAST or immediate complications. Tolerated well. Ultrasound image placed in chart.  Amalia Greenhouse, MD

## 2023-02-28 NOTE — Evaluation (Signed)
Physical Therapy Evaluation Patient Details Name: Nicholas Rasmussen MRN: [id number] DOB: 11-13-1945 Today's Date: 02/28/2023  History of Present Illness  Pt is 77 year old presented to [facility] on  02/28/23 for Lt TKR. PMH - HTN, CAD, DM, seizures  Clinical Impression  Pt doing well post op day #0 and very motivated to return to active lifestyle. Expect he will make excellent progress and he plans to return home with wife. Lives in 2nd floor apt so will practice stairs prior to dc.         If plan is discharge home, recommend the following: Assistance with cooking/housework;Help with stairs or ramp for entrance;Assist for transportation   Can travel by private vehicle        Equipment Recommendations Rolling walker (2 wheels)  Recommendations for Other Services       Functional Status Assessment Patient has had a recent decline in their functional status and demonstrates the ability to make significant improvements in function in a reasonable and predictable amount of time.     Precautions / Restrictions Precautions Precautions: Knee Restrictions Weight Bearing Restrictions: Yes LLE Weight Bearing: Weight bearing as tolerated      Mobility  Bed Mobility Overal bed mobility: Needs Assistance Bed Mobility: Supine to Sit     Supine to sit: Supervision          Transfers Overall transfer level: Needs assistance Equipment used: Rolling walker (2 wheels) Transfers: Sit to/from Stand Sit to Stand: Contact guard assist           General transfer comment: Assist for safety    Ambulation/Gait Ambulation/Gait assistance: Contact guard assist Gait Distance (Feet): 75 Feet Assistive device: Rolling walker (2 wheels) Gait Pattern/deviations: Step-through pattern, Decreased stride length Gait velocity: decr Gait velocity interpretation: 1.31 - 2.62 ft/sec, indicative of limited community ambulator   General Gait Details: Assist for safety  Stairs             Wheelchair Mobility     Tilt Bed    Modified Rankin (Stroke Patients Only)       Balance Overall balance assessment: No apparent balance deficits (not formally assessed)                                           Pertinent Vitals/Pain Pain Assessment Pain Assessment: 0-10 Pain Score: 2  Pain Location: lt knee Pain Descriptors / Indicators: Sore Pain Intervention(s): Monitored during session    Home Living Family/patient expects to be discharged to:: Private residence Living Arrangements: Spouse/significant other Available Help at Discharge: Family;Available 24 hours/day Type of Home: Apartment Home Access: Stairs to enter Entrance Stairs-Rails: Right Entrance Stairs-Number of Steps: flight   Home Layout: One level Home Equipment: None      Prior Function Prior Level of Function : Independent/Modified Independent;Driving             Mobility Comments: Likes to ballroom dance       Extremity/Trunk Assessment   Upper Extremity Assessment Upper Extremity Assessment: Overall WFL for tasks assessed    Lower Extremity Assessment Lower Extremity Assessment: LLE deficits/detail LLE Deficits / Details: good quad set. Knee AAROM 0-90       Communication   Communication Communication: No apparent difficulties  Cognition Arousal: Alert Behavior During Therapy: WFL for tasks assessed/performed Overall Cognitive Status: Within Functional Limits for tasks assessed  General Comments      Exercises Total Joint Exercises Quad Sets: AROM, Left, 5 reps, Supine Long Arc Quad: AAROM, Left, 5 reps, Seated Knee Flexion: AAROM, Left, 5 reps, Seated Goniometric ROM: 0-90   Assessment/Plan    PT Assessment Patient needs continued PT services  PT Problem List Decreased strength;Decreased range of motion;Decreased mobility;Pain       PT Treatment Interventions DME instruction;Gait  training;Stair training;Functional mobility training;Therapeutic activities;Therapeutic exercise;Patient/family education    PT Goals (Current goals can be found in the Care Plan section)  Acute Rehab PT Goals Patient Stated Goal: return to dancing PT Goal Formulation: With patient Time For Goal Achievement: 03/07/23 Potential to Achieve Goals: Good    Frequency Min 1X/week     Co-evaluation               AM-PAC PT "6 Clicks" Mobility  Outcome Measure Help needed turning from your back to your side while in a flat bed without using bedrails?: None Help needed moving from lying on your back to sitting on the side of a flat bed without using bedrails?: A Little Help needed moving to and from a bed to a chair (including a wheelchair)?: A Little Help needed standing up from a chair using your arms (e.g., wheelchair or bedside chair)?: A Little Help needed to walk in hospital room?: A Little Help needed climbing 3-5 steps with a railing? : A Little 6 Click Score: 19    End of Session Equipment Utilized During Treatment: Gait belt Activity Tolerance: Patient tolerated treatment well Patient left: in chair;with call bell/phone within reach Nurse Communication: Mobility status PT Visit Diagnosis: Other abnormalities of gait and mobility (R26.89);Pain Pain - Right/Left: Left Pain - part of body: Knee    Time: 7829-5621 PT Time Calculation (min) (ACUTE ONLY): 29 min   Charges:   PT Evaluation $PT Eval Low Complexity: 1 Low PT Treatments $Gait Training: 8-22 mins PT General Charges $$ ACUTE PT VISIT: 1 Visit         [facility] PT Acute Rehabilitation Services Office [phone]   Angelina Ok [facility] 02/28/2023, 6:37 PM

## 2023-02-28 NOTE — Transfer of Care (Signed)
Immediate Anesthesia Transfer of Care Note  Patient: Nicholas Rasmussen  Procedure(s) Performed: TOTAL KNEE ARTHROPLASTY (Left: Knee)  Patient Location: PACU  Anesthesia Type:Spinal  Level of Consciousness: awake, alert , and oriented  Airway & Oxygen Therapy: Patient Spontanous Breathing  Post-op Assessment: Report given to RN  Post vital signs: Reviewed and stable  Last Vitals:  Vitals Value Taken Time  BP 105/75 02/28/23 1452  Temp    Pulse 45 02/28/23 1454  Resp 16 02/28/23 1454  SpO2 96 % 02/28/23 1454  Vitals shown include unfiled device data.  Last Pain:  Vitals:   02/28/23 1205  PainSc: 0-No pain      Patients Stated Pain Goal: 0 (02/28/23 1016)  Complications: No notable events documented.

## 2023-02-28 NOTE — Op Note (Signed)
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 CPT [phone]  Surgeon: N. Glee Arvin, MD  Assist: Oneal Grout, PA-C; necessary for the timely completion of procedure and due to complexity of procedure.  Anesthesia: Spinal, regional, local  Tourniquet time: see anesthesia record  Implants used: Zimmer persona Femur: CR 12 Tibia: J Patella: 38 mm Polyethylene: 12 mm medial congruent  Indication: Nicholas Rasmussen is a 77 y.o. year old male with a history of knee pain. Having failed conservative management, the patient elected to proceed with a total knee arthroplasty.  We have reviewed the risk and benefits of the surgery and they elected to proceed after voicing understanding.  Procedure:  After informed consent was obtained and understanding of the risk were voiced including but not limited to bleeding, infection, damage to surrounding structures including nerves and vessels, blood clots, leg length inequality and the failure to achieve desired results, the operative extremity was marked with verbal confirmation of the patient in the holding area.   The patient was then brought to the operating room and transported to the operating room table in the supine position.  A tourniquet was applied to the operative extremity around the upper thigh. The operative limb was then prepped and draped in the usual sterile fashion and preoperative antibiotics were administered.  A time out was performed prior to the start of surgery confirming the correct extremity, preoperative antibiotic administration, as well as team members, implants and instruments available for the case. Correct surgical site was also confirmed with preoperative  radiographs. The limb was then elevated for exsanguination and the tourniquet was inflated. A midline incision was made and a standard medial parapatellar approach was performed.   I immediately encountered grade 4 chondromalacia of the femoral trochlea and we made the decision to perform a total knee replacement.  The infrapatellar fat pad was removed.  Suprapatellar synovium was removed to reveal the anterior distal femoral cortex.  A medial peel was performed to release the capsule and the deep MCL off of the medial tibial plateau.  The patella was then everted which showed complete loss of articular cartilage and was prepared and sized to a 38 mm.  A cover was placed on the patella for protection from retractors.  The knee was then brought into full flexion and we then turned our attention to the femur.  The cruciates were sacrificed.  Start site was drilled in the femur and the intramedullary distal femoral cutting guide was placed, set at 5 degrees valgus, taking 10 mm of distal resection. The distal cut was made. Osteophytes were then removed.  Next, the proximal tibial cutting guide was placed with appropriate slope, varus/valgus alignment and depth of resection.  The drop rod was attached to confirm that it was aimed at the second metatarsal.  The proximal tibial cut was made taking 4 mm off the low side. Gap blocks were then used to assess the extension gap and alignment, and appropriate soft tissue releases were performed. Attention was turned back to the femur, which was sized using the sizing guide to a size 12. Appropriate rotation of the femoral component was determined using epicondylar axis, Whiteside's line, and assessing the flexion gap under ligament tension. The appropriate size 4-in-1 cutting block was placed and checked with an angel wing and cuts  were made. Posterior femoral osteophytes and uncapped bone were then removed with the curved osteotome.  The menisci were removed.  Trial  components were placed, and stability was checked in full extension, mid-flexion, and deep flexion. Proper tibial rotation was determined and marked.  The patella tracked well without a lateral release.  The femoral lugs were then drilled. Trial components were then removed and tibial preparation performed.  The tibial bone quality was excellent.  The tibia was sized for a size J component.   The bony surfaces were irrigated with a pulse lavage and then dried. Bone cement was vacuum mixed on the back table, and the final components sized above were cemented into place.  Antibiotic irrigation was placed in the knee joint and soft tissues while the cement cured.  After cement had finished curing, excess cement was removed. The stability of the construct was re-evaluated throughout a range of motion and found to be acceptable. The trial liner was removed, the knee was copiously irrigated, and the knee was re-evaluated for any excess bone debris. The real polyethylene liner, 12 mm thick, was inserted and checked to ensure the locking mechanism had engaged appropriately. The tourniquet was deflated and hemostasis was achieved. The wound was irrigated with normal saline.  One gram of vancomycin powder was placed in the surgical bed.  Topical 0.25% bupivacaine and meloxicam was placed in the joint for postoperative pain.  Capsular closure was performed with a #1 vicryl at 45 degrees of flexion, subcutaneous fat closed with a 0 vicryl suture, then subcutaneous tissue closed with interrupted 2.0 vicryl suture. The skin was then closed with a 2.0 nylon and dermabond. A sterile dressing was applied.  The patient was awakened in the operating room and taken to recovery in stable condition. All sponge, needle, and instrument counts were correct at the end of the case.  [name] was necessary for opening, closing, retracting, limb positioning and overall facilitation and completion of the surgery.  Position: supine   Complications: none.  Time Out: performed   Drains/Packing: none  Estimated blood loss: minimal  Returned to Recovery Room: in good condition.   Antibiotics: yes   Mechanical VTE (DVT) Prophylaxis: sequential compression devices, TED thigh-high  Chemical VTE (DVT) Prophylaxis: aspirin  Fluid Replacement  Crystalloid: see anesthesia record Blood: none  FFP: none   Specimens Removed: 1 to pathology   Sponge and Instrument Count Correct? yes   PACU: portable radiograph - knee AP and Lateral   Plan/RTC: Return in 2 weeks for wound check.   Weight Bearing/Load Lower Extremity: full   Implant Name Type Inv. Item Serial No. Manufacturer Lot No. LRB No. Used Action  CEMENT BONE REFOBACIN [id number] [place] - [id number] Cement CEMENT BONE REFOBACIN [id number] [place]  ZIMMER RECON(ORTH,TRAU,BIO,SG) [id number] Left 1 Implanted  STEM POLY PAT PLY 68M KNEE - [id number] Knees STEM POLY PAT PLY 68M KNEE  ZIMMER RECON(ORTH,TRAU,BIO,SG) [id number] Left 1 Implanted  0 keel left size J cemented tibia    ZIMMER [id number] Left 1 Implanted  left 12 mm height use with tibia size j/cr femur size 12    ZIMMER [id number] Left 1 Implanted  COMP FEM CMT STD PS SZ12 LT - [id number] Joint COMP FEM CMT STD PS SZ12 LT  ZIMMER RECON(ORTH,TRAU,BIO,SG) [id number] Left 1 Implanted    N. Glee Arvin, MD [facility] 2:14 PM

## 2023-02-28 NOTE — Anesthesia Postprocedure Evaluation (Signed)
Anesthesia Post Note  Patient: Purnell Daigle  Procedure(s) Performed: TOTAL KNEE ARTHROPLASTY (Left: Knee)     Patient location during evaluation: PACU Anesthesia Type: Spinal Level of consciousness: awake and alert Pain management: pain level controlled Vital Signs Assessment: post-procedure vital signs reviewed and stable Respiratory status: spontaneous breathing, nonlabored ventilation and respiratory function stable Cardiovascular status: blood pressure returned to baseline Postop Assessment: no apparent nausea or vomiting, spinal receding, no headache and no backache Anesthetic complications: no   No notable events documented.  Last Vitals:  Vitals:   02/28/23 1600 02/28/23 1658  BP: 123/70 133/76  Pulse: (!) 41 (!) 41  Resp: 14 16  Temp: 36.4 C (!) 36.3 C  SpO2: 98% 100%    Last Pain:  Vitals:   02/28/23 1700  TempSrc:   PainSc: 0-No pain                 Shanda Howells

## 2023-02-28 NOTE — Discharge Instructions (Signed)

## 2023-03-01 DIAGNOSIS — Z96652 Presence of left artificial knee joint: Secondary | ICD-10-CM | POA: Diagnosis not present

## 2023-03-01 DIAGNOSIS — M1712 Unilateral primary osteoarthritis, left knee: Secondary | ICD-10-CM | POA: Diagnosis not present

## 2023-03-01 LAB — GLUCOSE, CAPILLARY
Glucose-Capillary: 153 mg/dL — ABNORMAL HIGH (ref 70–99)
Glucose-Capillary: 122 mg/dL — ABNORMAL HIGH (ref 70–99)

## 2023-03-01 NOTE — Progress Notes (Signed)
Pt foley removed. 10 cc of fluid retracted from balloon. Pt tolerated removal well. Peri care was preformed.

## 2023-03-01 NOTE — Progress Notes (Addendum)
Physical Therapy Treatment Patient Details Name: Nicholas Rasmussen MRN: [id number] DOB: 10-Nov-1945 Today's Date: 03/01/2023   History of Present Illness Pt is 77 year old presented to [facility] on  02/28/23 for Lt TKR. PMH - HTN, CAD, DM, seizures    PT Comments  Pt tolerates treatment well, ambulating for increased distances and negotiating a flight of stairs without physical assist. Pt demonstrates good strength and ROM. PT anticipates the pt will progress well as he is highly motivated. PT recommends discharge home when medically appropriate.    If plan is discharge home, recommend the following: Assistance with cooking/housework;Help with stairs or ramp for entrance;Assist for transportation   Can travel by private vehicle        Equipment Recommendations  Rolling walker (2 wheels);BSC/3in1    Recommendations for Other Services       Precautions / Restrictions Precautions Precautions: Knee Precaution Booklet Issued: Yes (comment) Restrictions Weight Bearing Restrictions: Yes LLE Weight Bearing: Weight bearing as tolerated     Mobility  Bed Mobility                    Transfers Overall transfer level: Modified independent Equipment used: Rolling walker (2 wheels) Transfers: Sit to/from Stand Sit to Stand: Modified independent (Device/Increase time)                Ambulation/Gait Ambulation/Gait assistance: Supervision Gait Distance (Feet): 100 Feet (100' x 2) Assistive device: Rolling walker (2 wheels) Gait Pattern/deviations: Step-through pattern Gait velocity: reduced Gait velocity interpretation: <1.8 ft/sec, indicate of risk for recurrent falls   General Gait Details: steady step-through gait   Stairs Stairs: Yes Stairs assistance: Contact guard assist Stair Management: Two rails, Step to pattern, Forwards Number of Stairs: 12 General stair comments: verbal cues for ascending with RLE and descending with LLE   Wheelchair Mobility     Tilt  Bed    Modified Rankin (Stroke Patients Only)       Balance Overall balance assessment: Needs assistance Sitting-balance support: No upper extremity supported, Feet supported Sitting balance-Leahy Scale: Good     Standing balance support: Bilateral upper extremity supported, Reliant on assistive device for balance Standing balance-Leahy Scale: Poor                              Cognition Arousal: Alert Behavior During Therapy: WFL for tasks assessed/performed Overall Cognitive Status: Within Functional Limits for tasks assessed                                          Exercises Total Joint Exercises Goniometric ROM: -3 extension, 100 flexion    General Comments General comments (skin integrity, edema, etc.): VSS on RA      Pertinent Vitals/Pain Pain Assessment Pain Assessment: 0-10 Pain Score: 3  Pain Location: L knee Pain Descriptors / Indicators: Sore Pain Intervention(s): Monitored during session    Home Living Family/patient expects to be discharged to:: Private residence Living Arrangements: Spouse/significant other Available Help at Discharge: Family;Available 24 hours/day Type of Home: Apartment Home Access: Stairs to enter Entrance Stairs-Rails: Right Entrance Stairs-Number of Steps: flight   Home Layout: One level Home Equipment: None      Prior Function            PT Goals (current goals can now be found in the care plan section)  Acute Rehab PT Goals Patient Stated Goal: return to dancing Progress towards PT goals: Progressing toward goals    Frequency    Min 1X/week      PT Plan      Co-evaluation              AM-PAC PT "6 Clicks" Mobility   Outcome Measure  Help needed turning from your back to your side while in a flat bed without using bedrails?: None Help needed moving from lying on your back to sitting on the side of a flat bed without using bedrails?: None Help needed moving to and from a  bed to a chair (including a wheelchair)?: None Help needed standing up from a chair using your arms (e.g., wheelchair or bedside chair)?: None Help needed to walk in hospital room?: A Little Help needed climbing 3-5 steps with a railing? : A Little 6 Click Score: 22    End of Session Equipment Utilized During Treatment: Gait belt Activity Tolerance: Patient tolerated treatment well Patient left: in chair;with call bell/phone within reach Nurse Communication: Mobility status PT Visit Diagnosis: Other abnormalities of gait and mobility (R26.89);Pain Pain - Right/Left: Left Pain - part of body: Knee     Time: 1610-9604 PT Time Calculation (min) (ACUTE ONLY): 34 min  Charges:    $Gait Training: 23-37 mins PT General Charges $$ ACUTE PT VISIT: 1 Visit                     Arlyss Gandy, PT, DPT Acute Rehabilitation Office [phone]    Arlyss Gandy 03/01/2023, 10:03 AM

## 2023-03-01 NOTE — Progress Notes (Signed)
Transition of Care [facility]) - Inpatient Brief Assessment   Patient Details  Name: Nicholas Rasmussen MRN: [id number] Date of Birth: April 25, 1946  [facility] CM/SW Contact:    Janae Bridgeman, RN Phone Number: 03/01/2023, 10:50 AM   Clinical Narrative: CM met with the patient and wife at the bedside to discuss TOC needs.  The patient is S/P Left Uni-knee surgery and plans to discharge to home today once DME arrives.  Patient was evaluated by PT/OT and tub shower bench and 3:1 were recommended.  The patient has RW in the hospital room and patient plans to call [employer] to deliver CPM machine to the home.  This was arranged prior to surgery. I provided Medicare choice to the patient regarding DME company and patient did not have a preference.  I called [employer] and asked that 3:1 be delivered to the hospital room prior to discharge.  Patient's wife plans to order a tub-shower bench from [employer].  Patient was provided with the Medicare Obs notice at the bedside.  Patient was set up with [facility] prior to surgery by the orthopedic office.   I sent message to [facility], [facility] and she is aware that patient is discharging home.  Home health orders are in place.  Patient's wife will provide transportation to home.   Transition of Care Asessment: Insurance and Status: (P) Insurance coverage has been reviewed Patient has primary care physician: (P) Yes Home environment has been reviewed: (P) From home with spouse Prior level of function:: (P) Independent Prior/Current Home Services: (P) No current home services Social Determinants of Health Reivew: (P) SDOH reviewed needs interventions Readmission risk has been reviewed: (P) Yes Transition of care needs: (P) transition of care needs identified, TOC will continue to follow

## 2023-03-01 NOTE — Care Management Obs Status (Signed)
MEDICARE OBSERVATION STATUS NOTIFICATION   Patient Details  Name: Nicholas Rasmussen MRN: [id number] Date of Birth: 1946/05/03   Medicare Observation Status Notification Given:  Yes    Janae Bridgeman, RN 03/01/2023, 10:28 AM

## 2023-03-01 NOTE — Progress Notes (Signed)
AVS given and went over with pt. Pt voided with no residual. CM gave HH resources and DME ordered for home.

## 2023-03-01 NOTE — Progress Notes (Signed)
   Subjective:  Patient reports pain as mild.    Objective:   VITALS:   Vitals:   02/28/23 1658 02/28/23 2116 03/01/23 0050 03/01/23 0606  BP: 133/76 130/66 113/69 134/68  Pulse: (!) 41 (!) 44 (!) 46 (!) 46  Resp: 16  16 18   Temp: (!) 97.4 F (36.3 C) 97.8 F (36.6 C) 97.8 F (36.6 C) 98.2 F (36.8 C)  TempSrc: Oral Oral Oral Oral  SpO2: 100% 99% 99% 100%  Weight:      Height:        Sensation intact distally Intact pulses distally Dorsiflexion/Plantar flexion intact Incision: dressing C/D/I and no drainage   Lab Results  Component Value Date   WBC 6.1 02/14/2023   HGB 12.0 (L) 02/14/2023   HCT 39.0 02/14/2023   MCV 84.6 02/14/2023   PLT 228 02/14/2023     Assessment/Plan:  1 Day Post-Op   - Expected postop acute blood loss anemia - Up with PT/OT - DVT ppx - SCDs, ambulation, aspirin - WBAT operative extremity - Pain control - Discharge planning - home today after PT and urinates  Nicholas Rasmussen 03/01/2023, 7:32 AM

## 2023-03-01 NOTE — Plan of Care (Signed)
  Problem: Education: Goal: Ability to describe self-care measures that may prevent or decrease complications (Diabetes Survival Skills Education) will improve Outcome: Progressing   Problem: Coping: Goal: Ability to adjust to condition or change in health will improve Outcome: Progressing   Problem: Fluid Volume: Goal: Ability to maintain a balanced intake and output will improve Outcome: Progressing   Problem: Health Behavior/Discharge Planning: Goal: Ability to identify and utilize available resources and services will improve Outcome: Progressing Goal: Ability to manage health-related needs will improve Outcome: Progressing   Problem: Metabolic: Goal: Ability to maintain appropriate glucose levels will improve Outcome: Progressing   Problem: Nutritional: Goal: Maintenance of adequate nutrition will improve Outcome: Progressing Goal: Progress toward achieving an optimal weight will improve Outcome: Progressing   Problem: Skin Integrity: Goal: Risk for impaired skin integrity will decrease Outcome: Progressing   Problem: Tissue Perfusion: Goal: Adequacy of tissue perfusion will improve Outcome: Progressing   Problem: Education: Goal: Knowledge of the prescribed therapeutic regimen will improve Outcome: Progressing   Problem: Activity: Goal: Ability to avoid complications of mobility impairment will improve Outcome: Progressing

## 2023-03-01 NOTE — Discharge Summary (Signed)
Patient ID: Nicholas Rasmussen MRN: [id number] DOB/AGE: 08/22/1945 77 y.o.  Admit date: 02/28/2023 Discharge date: 03/01/2023  Admission Diagnoses:  Primary osteoarthritis of left knee  Discharge Diagnoses:  Principal Problem:   Primary osteoarthritis of left knee Active Problems:   Status post total left knee replacement   Past Medical History:  Diagnosis Date   Allergic rhinitis 05/07/2014   Arthritis    Diabetes mellitus without complication (HCC)    Dizziness 01/02/2016   GERD (gastroesophageal reflux disease)    Headache 03/13/2015   Kidney lesion 07/22/2020   right, followed by urology   Left flank pain 01/02/2016   Low back pain 06/06/2015   Pain of left side of body 08/13/2015   Post herpetic neuralgia    Routine general medical examination at a health care facility 05/07/2014   Seizure disorder (HCC) 05/07/2014   Seizures (HCC)    Last seizure in the 70s.   Shingles    Stye 05/07/2014    Surgeries: Procedure(s): TOTAL KNEE ARTHROPLASTY on 02/28/2023   Consultants (if any):   Discharged Condition: Improved  Hospital Course: Nicholas Rasmussen is an 77 y.o. male who was admitted 02/28/2023 with a diagnosis of Primary osteoarthritis of left knee and went to the operating room on 02/28/2023 and underwent the above named procedures.    He was given perioperative antibiotics:  Anti-infectives (From admission, onward)    Start     Dose/Rate Route Frequency Ordered Stop   03/01/23 1000  doxycycline (VIBRA-TABS) tablet 100 mg       Note to Pharmacy: To be taken after surgery     100 mg Oral 2 times daily 02/28/23 1626     02/28/23 1800  ceFAZolin (ANCEF) IVPB 2g/100 mL premix        2 g 200 mL/hr over 30 Minutes Intravenous Every 6 hours 02/28/23 1626 03/01/23 0048   02/28/23 1325  vancomycin (VANCOCIN) powder  Status:  Discontinued          As needed 02/28/23 1326 02/28/23 1449   02/28/23 0948  ceFAZolin (ANCEF) 2-4 GM/100ML-% IVPB       Note to Pharmacy:  [facility] A: cabinet override      02/28/23 0948 03/01/23 0048   02/28/23 0945  ceFAZolin (ANCEF) IVPB 2g/100 mL premix        2 g 200 mL/hr over 30 Minutes Intravenous On call to O.R. 02/28/23 0932 02/28/23 1242     .  He was given sequential compression devices, early ambulation, and appropriate chemoprophylaxis for DVT prophylaxis.  He benefited maximally from the hospital stay and there were no complications.    Recent vital signs:  Vitals:   03/01/23 0050 03/01/23 0606  BP: 113/69 134/68  Pulse: (!) 46 (!) 46  Resp: 16 18  Temp: 97.8 F (36.6 C) 98.2 F (36.8 C)  SpO2: 99% 100%    Recent laboratory studies:  Lab Results  Component Value Date   HGB 12.0 (L) 02/14/2023   HGB 13.5 12/31/2022   HGB 12.9 (L) 06/05/2021   Lab Results  Component Value Date   WBC 6.1 02/14/2023   PLT 228 02/14/2023   No results found for: "INR" Lab Results  Component Value Date   NA 140 02/14/2023   K 3.5 02/14/2023   CL 104 02/14/2023   CO2 28 02/14/2023   BUN 16 02/14/2023   CREATININE 1.19 02/14/2023   GLUCOSE 133 (H) 02/14/2023    Discharge Medications:   Allergies as of 03/01/2023  Reactions   Jardiance [empagliflozin] Other (See Comments)   Yeast infection        Medication List     STOP taking these medications    acetaminophen 500 MG tablet Commonly known as: TYLENOL   acyclovir cream 5 % Commonly known as: ZOVIRAX   amoxicillin 500 MG capsule Commonly known as: AMOXIL   ibuprofen 800 MG tablet Commonly known as: ADVIL       TAKE these medications    aspirin EC 81 MG tablet Take 1 tablet (81 mg total) by mouth 2 (two) times daily. To be taken after surgery to prevent blood clots What changed: Another medication with the same name was removed. Continue taking this medication, and follow the directions you see here.   atorvastatin 20 MG tablet Commonly known as: LIPITOR Take 1 tablet (20 mg total) by mouth every evening.    bacitracin-neomycin-polymyxin-hydrocortisone 1 % ophthalmic ointment Commonly known as: CORTISPORIN Place 1 Application into the left eye at bedtime.   clotrimazole 1 % cream Commonly known as: LOTRIMIN APPLY TO AFFECTED AREA TWICE A DAY What changed: See the new instructions.   divalproex 500 MG 24 hr tablet Commonly known as: DEPAKOTE ER Take 1 tablet (500 mg total) by mouth at bedtime.   docusate sodium 100 MG capsule Commonly known as: Colace Take 1 capsule (100 mg total) by mouth daily as needed.   doxycycline 100 MG capsule Commonly known as: Vibramycin Take 1 capsule (100 mg total) by mouth 2 (two) times daily. To be taken after surgery   gabapentin 300 MG capsule Commonly known as: NEURONTIN TAKE 1 CAPSULE BY MOUTH EVERYDAY AT BEDTIME   glipiZIDE 5 MG 24 hr tablet Commonly known as: GLUCOTROL XL TAKE 1 TABLET BY MOUTH EVERY DAY WITH BREAKFAST   ipratropium 0.03 % nasal spray Commonly known as: ATROVENT Place 2 sprays into both nostrils daily.   ketoconazole 2 % shampoo Commonly known as: NIZORAL APPLY 1 APPLICATION TOPICALLY 2 (TWO) TIMES A WEEK. What changed: when to take this   meclizine 25 MG tablet Commonly known as: ANTIVERT Take 1 tablet (25 mg total) by mouth 3 (three) times daily as needed for dizziness.   methocarbamol 750 MG tablet Commonly known as: Robaxin-750 Take 1 tablet (750 mg total) by mouth 2 (two) times daily as needed for muscle spasms.   metoprolol succinate 25 MG 24 hr tablet Commonly known as: TOPROL-XL TAKE 1 TABLET (25 MG TOTAL) BY MOUTH DAILY.   omeprazole 20 MG capsule Commonly known as: PRILOSEC TAKE 1 CAPSULE BY MOUTH EVERY DAY AS NEEDED What changed: See the new instructions.   ondansetron 4 MG tablet Commonly known as: Zofran Take 1 tablet (4 mg total) by mouth every 8 (eight) hours as needed for nausea or vomiting.   oxyCODONE-acetaminophen 5-325 MG tablet Commonly known as: Percocet Take 1-2 tablets by mouth  every 6 (six) hours as needed. To be taken after surgery   PROBIOTIC PO Take 1 tablet by mouth daily.   Vitamin D-3 125 MCG (5000 UT) Tabs Take 5,000 Units by mouth daily.               Durable Medical Equipment  (From admission, onward)           Start     Ordered   02/28/23 1627  DME Walker rolling  Once       Question Answer Comment  Walker: With 5 Inch Wheels   Patient needs a walker to treat with the  following condition Status post left partial knee replacement      02/28/23 1626   02/28/23 1627  DME 3 n 1  Once        02/28/23 1626   02/28/23 1627  DME Bedside commode  Once       Question:  Patient needs a bedside commode to treat with the following condition  Answer:  Status post left partial knee replacement   02/28/23 1626            Diagnostic Studies: DG Knee Left Port  Result Date: 02/28/2023 CLINICAL DATA:  Postop knee replacement. EXAM: PORTABLE LEFT KNEE - 1-2 VIEW COMPARISON:  None Available. FINDINGS: Left knee arthroplasty in expected alignment. No periprosthetic lucency or fracture. Recent postsurgical change includes air and edema in the soft tissues and joint space. Vascular calcifications posteriorly peer IMPRESSION: Left knee arthroplasty without immediate postoperative complication. Electronically Signed   By: Narda Rutherford M.D.   On: 02/28/2023 15:22   XR KNEE 3 VIEW LEFT  Result Date: 02/11/2023 X-rays of the left knee show advanced degenerative joint disease.  Bone-on-bone joint space narrowing.   Disposition: Discharge disposition: 01-Home or Self Care       Discharge Instructions     Call MD / Call 911   Complete by: As directed    If you experience chest pain or shortness of breath, CALL 911 and be transported to the hospital emergency room.  If you develope a fever above 101.5 F, pus (white drainage) or increased drainage or redness at the wound, or calf pain, call your surgeon's office.   Constipation Prevention    Complete by: As directed    Drink plenty of fluids.  Prune juice may be helpful.  You may use a stool softener, such as Colace (over the counter) 100 mg twice a day.  Use MiraLax (over the counter) for constipation as needed.   Driving restrictions   Complete by: As directed    No driving while taking narcotic pain meds.   Increase activity slowly as tolerated   Complete by: As directed    Post-operative opioid taper instructions:   Complete by: As directed    POST-OPERATIVE OPIOID TAPER INSTRUCTIONS: It is important to wean off of your opioid medication as soon as possible. If you do not need pain medication after your surgery it is ok to stop day one. Opioids include: Codeine, Hydrocodone(Norco, Vicodin), Oxycodone(Percocet, oxycontin) and hydromorphone amongst others.  Long term and even short term use of opiods can cause: Increased pain response Dependence Constipation Depression Respiratory depression And more.  Withdrawal symptoms can include Flu like symptoms Nausea, vomiting And more Techniques to manage these symptoms Hydrate well Eat regular healthy meals Stay active Use relaxation techniques(deep breathing, meditating, yoga) Do Not substitute Alcohol to help with tapering If you have been on opioids for less than two weeks and do not have pain than it is ok to stop all together.  Plan to wean off of opioids This plan should start within one week post op of your joint replacement. Maintain the same interval or time between taking each dose and first decrease the dose.  Cut the total daily intake of opioids by one tablet each day Next start to increase the time between doses. The last dose that should be eliminated is the evening dose.           Follow-up Information     Cristie Hem, PA-C. Schedule an appointment as soon as possible  for a visit in 2 week(s).   Specialty: Orthopedic Surgery Contact information: [address] [place]  [address] [phone]                  Signed: Glee Arvin 03/01/2023, 7:32 AM

## 2023-03-01 NOTE — Evaluation (Signed)
Occupational Therapy Evaluation Patient Details Name: Nicholas Rasmussen MRN: [id number] DOB: [birth year] Today's Date: 03/01/2023   History of Present Illness Pt is 77 year old presented to [facility] on  02/28/23 for Lt TKR. PMH - HTN, CAD, DM, seizures   Clinical Impression   Pt at baseline is active and lives with wife on second floor. Pt was educated about DME/AE to use at home to maximize independence with return to home. Pt at this time was able to complete ADLS with no BUE at sink with no LOB and LB ADLS while sitting with supervision for cues on how to complete. Acute Occupational Therapy signing off. Thank you.        If plan is discharge home, recommend the following: A little help with bathing/dressing/bathroom;Assist for transportation;Help with stairs or ramp for entrance    Functional Status Assessment  Patient has had a recent decline in their functional status and demonstrates the ability to make significant improvements in function in a reasonable and predictable amount of time.  Equipment Recommendations  Tub/shower seat    Recommendations for Other Services       Precautions / Restrictions Precautions Precautions: Knee Restrictions Weight Bearing Restrictions: Yes LLE Weight Bearing: Weight bearing as tolerated      Mobility Bed Mobility Overal bed mobility: Modified Independent Bed Mobility: Supine to Sit     Supine to sit: Modified independent (Device/Increase time)     General bed mobility comments: made bed flat and had no difficulties    Transfers Overall transfer level: Needs assistance Equipment used: Rolling walker (2 wheels) Transfers: Sit to/from Stand Sit to Stand: Supervision           General transfer comment: one cue for positioning      Balance Overall balance assessment: Mild deficits observed, not formally tested                                         ADL either performed or assessed with clinical judgement    ADL Overall ADL's : Needs assistance/impaired Eating/Feeding: Independent;Sitting   Grooming: Wash/dry hands;Wash/dry face;Modified independent;Bed level   Upper Body Bathing: Modified independent;Sitting   Lower Body Bathing: Supervison/ safety;Sit to/from stand   Upper Body Dressing : Modified independent;Sitting   Lower Body Dressing: Supervision/safety;Sit to/from stand   Toilet Transfer: Modified Independent;Cueing for safety;Cueing for sequencing;Rolling walker (2 wheels)   Toileting- Clothing Manipulation and Hygiene: Supervision/safety;Cueing for safety;Cueing for sequencing;Sit to/from stand   Tub/ Shower Transfer: Contact guard assist;Cueing for safety;Cueing for sequencing;Shower seat   Functional mobility during ADLs: Supervision/safety;Rolling walker (2 wheels)       Vision Baseline Vision/History: 1 Wears glasses Ability to See in Adequate Light: 0 Adequate Patient Visual Report: No change from baseline Vision Assessment?: Wears glasses for reading     Perception Perception: Within Functional Limits       Praxis Praxis: WFL       Pertinent Vitals/Pain Pain Assessment Pain Assessment: 0-10 Pain Score: 2  Pain Location: sx site Pain Descriptors / Indicators: Sore Pain Intervention(s): Limited activity within patient's tolerance, Monitored during session, Repositioned     Extremity/Trunk Assessment Upper Extremity Assessment Upper Extremity Assessment: Overall WFL for tasks assessed   Lower Extremity Assessment Lower Extremity Assessment: Defer to PT evaluation   Cervical / Trunk Assessment Cervical / Trunk Assessment: Normal   Communication Communication Communication: No apparent difficulties   Cognition Arousal:  Alert Behavior During Therapy: WFL for tasks assessed/performed Overall Cognitive Status: Within Functional Limits for tasks assessed                                       General Comments       Exercises      Shoulder Instructions      Home Living Family/patient expects to be discharged to:: Private residence Living Arrangements: Spouse/significant other Available Help at Discharge: Family;Available 24 hours/day Type of Home: Apartment Home Access: Stairs to enter Entrance Stairs-Number of Steps: flight Entrance Stairs-Rails: Right Home Layout: One level     Bathroom Shower/Tub: IT trainer: Standard     Home Equipment: None          Prior Functioning/Environment Prior Level of Function : Independent/Modified Independent;Driving             Mobility Comments: Likes to ballroom dance          OT Problem List: Decreased strength;Decreased activity tolerance;Impaired balance (sitting and/or standing);Decreased safety awareness;Cardiopulmonary status limiting activity;Pain      OT Treatment/Interventions: Self-care/ADL training;Balance training;Patient/family education;DME and/or AE instruction    OT Goals(Current goals can be found in the care plan section) Acute Rehab OT Goals Patient Stated Goal: to go home and get back to the things use to do OT Goal Formulation: With patient Time For Goal Achievement: 03/15/23 Potential to Achieve Goals: Good  OT Frequency: Min 1X/week    Co-evaluation              AM-PAC OT "6 Clicks" Daily Activity     Outcome Measure Help from another person eating meals?: None Help from another person taking care of personal grooming?: None Help from another person toileting, which includes using toliet, bedpan, or urinal?: None Help from another person bathing (including washing, rinsing, drying)?: A Little Help from another person to put on and taking off regular upper body clothing?: None Help from another person to put on and taking off regular lower body clothing?: A Little 6 Click Score: 22   End of Session Equipment Utilized During Treatment: Gait belt;Rolling walker (2 wheels)  Activity  Tolerance: Patient tolerated treatment well Patient left: in chair;with call bell/phone within reach  OT Visit Diagnosis: Unsteadiness on feet (R26.81);Other abnormalities of gait and mobility (R26.89);Muscle weakness (generalized) (M62.81);Pain Pain - Right/Left: Left Pain - part of body: Knee                Time: 0732-0820 OT Time Calculation (min): 48 min Charges:  OT General Charges $OT Visit: 1 Visit OT Evaluation $OT Eval Low Complexity: 1 Low OT Treatments $Self Care/Home Management : 23-37 mins  Nicholas Rasmussen OTR/L  Acute Rehab Services  [phone] office number   Nicholas Rasmussen 03/01/2023, 8:28 AM

## 2023-03-02 ENCOUNTER — Telehealth: Payer: Self-pay | Admitting: *Deleted

## 2023-03-02 DIAGNOSIS — Z471 Aftercare following joint replacement surgery: Secondary | ICD-10-CM | POA: Diagnosis not present

## 2023-03-02 DIAGNOSIS — R059 Cough, unspecified: Secondary | ICD-10-CM | POA: Diagnosis not present

## 2023-03-02 DIAGNOSIS — E119 Type 2 diabetes mellitus without complications: Secondary | ICD-10-CM | POA: Diagnosis not present

## 2023-03-02 DIAGNOSIS — Z7982 Long term (current) use of aspirin: Secondary | ICD-10-CM | POA: Diagnosis not present

## 2023-03-02 DIAGNOSIS — Z79891 Long term (current) use of opiate analgesic: Secondary | ICD-10-CM | POA: Diagnosis not present

## 2023-03-02 DIAGNOSIS — G43909 Migraine, unspecified, not intractable, without status migrainosus: Secondary | ICD-10-CM | POA: Diagnosis not present

## 2023-03-02 DIAGNOSIS — Z7984 Long term (current) use of oral hypoglycemic drugs: Secondary | ICD-10-CM | POA: Diagnosis not present

## 2023-03-02 DIAGNOSIS — R519 Headache, unspecified: Secondary | ICD-10-CM | POA: Diagnosis not present

## 2023-03-02 DIAGNOSIS — K219 Gastro-esophageal reflux disease without esophagitis: Secondary | ICD-10-CM | POA: Diagnosis not present

## 2023-03-02 DIAGNOSIS — R439 Unspecified disturbances of smell and taste: Secondary | ICD-10-CM | POA: Diagnosis not present

## 2023-03-02 DIAGNOSIS — Z96652 Presence of left artificial knee joint: Secondary | ICD-10-CM | POA: Diagnosis not present

## 2023-03-02 DIAGNOSIS — Z20822 Contact with and (suspected) exposure to covid-19: Secondary | ICD-10-CM | POA: Diagnosis not present

## 2023-03-02 NOTE — Telephone Encounter (Signed)
Patient doing extremely well since d/c home yesterday; has had his first HHPT session. Scheduled for OPPT to begin on 03/15/23. He is walking and using CPM. Reviewed medications and feels comfortable with his care currently. No new needs.

## 2023-03-03 ENCOUNTER — Telehealth: Payer: Self-pay | Admitting: Orthopaedic Surgery

## 2023-03-03 NOTE — Telephone Encounter (Signed)
Called and gave verbal 

## 2023-03-03 NOTE — Telephone Encounter (Signed)
Will from [facility] home [facility] called and need verbal orders 2 week 2 then 1 week 1. [id number]

## 2023-03-06 DIAGNOSIS — R439 Unspecified disturbances of smell and taste: Secondary | ICD-10-CM | POA: Diagnosis not present

## 2023-03-06 DIAGNOSIS — R519 Headache, unspecified: Secondary | ICD-10-CM | POA: Diagnosis not present

## 2023-03-06 DIAGNOSIS — R059 Cough, unspecified: Secondary | ICD-10-CM | POA: Diagnosis not present

## 2023-03-06 DIAGNOSIS — Z20822 Contact with and (suspected) exposure to covid-19: Secondary | ICD-10-CM | POA: Diagnosis not present

## 2023-03-07 ENCOUNTER — Other Ambulatory Visit: Payer: Self-pay | Admitting: Physician Assistant

## 2023-03-07 ENCOUNTER — Encounter (HOSPITAL_COMMUNITY): Payer: Self-pay | Admitting: Orthopaedic Surgery

## 2023-03-07 ENCOUNTER — Telehealth: Payer: Self-pay | Admitting: *Deleted

## 2023-03-07 MED ORDER — OXYCODONE-ACETAMINOPHEN 5-325 MG PO TABS: 1.0000 | ORAL_TABLET | Freq: Four times a day (QID) | ORAL | 0 refills | Status: DC | PRN

## 2023-03-07 NOTE — Telephone Encounter (Signed)
Call to patient and updated on instructions.

## 2023-03-07 NOTE — Telephone Encounter (Signed)
Call from patient asking about pain medication. States he is taking 2 Percocet at a time every 6 hours as scheduled and it is just not helping. Asked if there was anything different he could take. He is taking Gabapentin as prescribed by his other MD-300 mg 1 tablet at bedtime. Also taking muscle relaxer. We went over icing, elevating and all that and expectation of pain medication. He is not getting any sleep he states.  He just wanted to know if there was anything different he could take. If nothing different, he'll need a refill of pain medication. Are you ok with an anti-inflammatory like Aleve, Advil, etc.

## 2023-03-07 NOTE — Telephone Encounter (Signed)
Can add 200-400 mg ibuprofen bid if  needed.  I will go ahead and refill paid meds as well

## 2023-03-07 NOTE — Telephone Encounter (Signed)
thanks

## 2023-03-10 DIAGNOSIS — R439 Unspecified disturbances of smell and taste: Secondary | ICD-10-CM | POA: Diagnosis not present

## 2023-03-10 DIAGNOSIS — R519 Headache, unspecified: Secondary | ICD-10-CM | POA: Diagnosis not present

## 2023-03-10 DIAGNOSIS — Z20822 Contact with and (suspected) exposure to covid-19: Secondary | ICD-10-CM | POA: Diagnosis not present

## 2023-03-10 DIAGNOSIS — R059 Cough, unspecified: Secondary | ICD-10-CM | POA: Diagnosis not present

## 2023-03-15 ENCOUNTER — Encounter: Payer: Self-pay | Admitting: Physical Therapy

## 2023-03-15 ENCOUNTER — Ambulatory Visit: Payer: PPO | Admitting: Physical Therapy

## 2023-03-15 ENCOUNTER — Ambulatory Visit (INDEPENDENT_AMBULATORY_CARE_PROVIDER_SITE_OTHER): Payer: PPO | Admitting: Physician Assistant

## 2023-03-15 ENCOUNTER — Encounter: Payer: Self-pay | Admitting: Physician Assistant

## 2023-03-15 ENCOUNTER — Telehealth: Payer: Self-pay | Admitting: *Deleted

## 2023-03-15 ENCOUNTER — Other Ambulatory Visit: Payer: Self-pay

## 2023-03-15 DIAGNOSIS — M25562 Pain in left knee: Secondary | ICD-10-CM | POA: Diagnosis not present

## 2023-03-15 DIAGNOSIS — R262 Difficulty in walking, not elsewhere classified: Secondary | ICD-10-CM

## 2023-03-15 DIAGNOSIS — M6281 Muscle weakness (generalized): Secondary | ICD-10-CM | POA: Diagnosis not present

## 2023-03-15 DIAGNOSIS — R600 Localized edema: Secondary | ICD-10-CM | POA: Diagnosis not present

## 2023-03-15 DIAGNOSIS — Z96652 Presence of left artificial knee joint: Secondary | ICD-10-CM | POA: Diagnosis not present

## 2023-03-15 MED ORDER — METHOCARBAMOL 750 MG PO TABS: 750.0000 mg | ORAL_TABLET | Freq: Two times a day (BID) | ORAL | 2 refills | Status: DC | PRN

## 2023-03-15 MED ORDER — OXYCODONE-ACETAMINOPHEN 5-325 MG PO TABS
1.0000 | ORAL_TABLET | Freq: Four times a day (QID) | ORAL | 0 refills | Status: DC | PRN
Start: 2023-03-15 — End: 2023-07-17

## 2023-03-15 NOTE — Therapy (Signed)
OUTPATIENT PHYSICAL THERAPY LOWER EXTREMITY EVALUATION   Patient Name: Nicholas Rasmussen MRN: [id number] DOB:05/14/1946, 77 y.o., male Today's Date: 03/15/2023  END OF SESSION:  PT End of Session - 03/15/23 1349     Visit Number 1    Number of Visits 24    Date for PT Re-Evaluation 06/10/23    PT Start Time 1346    PT Stop Time 1430    PT Time Calculation (min) 44 min    Activity Tolerance Patient tolerated treatment well    Behavior During Therapy [facility] for tasks assessed/performed             Past Medical History:  Diagnosis Date   Allergic rhinitis 05/07/2014   Arthritis    Diabetes mellitus without complication (HCC)    Dizziness 01/02/2016   GERD (gastroesophageal reflux disease)    Headache 03/13/2015   Kidney lesion 07/22/2020   right, followed by urology   Left flank pain 01/02/2016   Low back pain 06/06/2015   Pain of left side of body 08/13/2015   Post herpetic neuralgia    Routine general medical examination at a health care facility 05/07/2014   Seizure disorder (HCC) 05/07/2014   Seizures (HCC)    Last seizure in the 70s.   Shingles    Stye 05/07/2014   Past Surgical History:  Procedure Laterality Date   COLONOSCOPY     KNEE SURGERY Left    left miniscus   TOTAL KNEE ARTHROPLASTY Left 02/28/2023   Procedure: TOTAL KNEE ARTHROPLASTY;  Surgeon: Tarry Kos, MD;  Location: [facility] OR;  Service: Orthopedics;  Laterality: Left;   UPPER GASTROINTESTINAL ENDOSCOPY     uvala surgery     Patient Active Problem List   Diagnosis Date Noted   Status post total left knee replacement 02/28/2023   Renal mass, right; Bosniak 4 complex cyst; 1 cm 09/09/2022   Vasomotor rhinitis 03/03/2022   Acute medial meniscus tear of left knee 02/04/2022   Primary osteoarthritis of left knee 02/04/2022   Aortic root dilatation (HCC) 12/18/2021   Slow transit constipation 12/08/2021   Left-sided chest wall pain 12/08/2021   Multiple pulmonary nodules 10/06/2021    Hyperlipidemia associated with type 2 diabetes mellitus (HCC) 08/19/2021   [name] 04/07/2021   Complex renal cyst 04/07/2021   Varicose veins of both lower extremities with pain 02/02/2019   Encounter for therapeutic drug level monitoring 09/19/2018   Type 2 diabetes mellitus with diabetic polyneuropathy, without long-term current use of insulin (HCC) 03/29/2018   Diabetic neuropathy (HCC) 03/29/2018   Chronic pain of left knee 03/23/2018   Coronary artery disease involving native coronary artery of native heart with angina pectoris (HCC) 08/03/2017   Post herpetic neuralgia 07/29/2016   Dizziness 01/02/2016   Chronic midline low back pain without sciatica 06/06/2015   Nonintractable episodic headache 03/13/2015   GERD (gastroesophageal reflux disease) 11/15/2014   Seizure disorder (HCC) 05/07/2014   Allergic rhinitis 05/07/2014    PCP: Anne Ng, NP   REFERRING PROVIDER: Tarry Kos, MD   REFERRING DIAG:  Diagnosis  M17.12 (ICD-10-CM) - Primary osteoarthritis of left knee    THERAPY DIAG:  Acute pain of left knee  Muscle weakness (generalized)  Difficulty in walking, not elsewhere classified  Localized edema  Rationale for Evaluation and Treatment: Rehabilitation  ONSET DATE: 02/28/23  SUBJECTIVE:   SUBJECTIVE STATEMENT: Pt s/p left TKA on 02/28/23.   PERTINENT HISTORY: Pt s/p left TKA on 02/28/23.PMH: diabetes,  HTN, CAD, seizures, OA and medial meniscus tear of left knee   PAIN:  NPRS scale: 5/10 Pain location: left knee Pain description: achy, throbbing, soreness, stiffness Aggravating factors: bending, sleeping, sit to stand Relieving factors: resting, taking pain meds, ice  PRECAUTIONS: None  WEIGHT BEARING RESTRICTIONS: No  FALLS:  Has patient fallen in last 6 months? No  LIVING ENVIRONMENT: Lives in: House/apartment Stairs: 1 flight on stairs, said he goes up and down sideways  OCCUPATION:   retired -- enjoys dancing,   PLOF: Independent  PATIENT GOALS:  get back to dancing   Next MD visit:   OBJECTIVE:   DIAGNOSTIC FINDINGS: FINDINGS: Left knee arthroplasty in expected alignment. No periprosthetic lucency or fracture. Recent postsurgical change includes air and edema in the soft tissues and joint space. Vascular calcifications posteriorly peer   IMPRESSION: Left knee arthroplasty without immediate postoperative complication.  PATIENT SURVEYS:  03/15/23: FOTO intake:       COGNITION: Overall cognitive status: WFL    SENSATION: WFL  EDEMA:  Circumferential: Left:   centimeters Rt: 44.5 centimeters   PALPATION: TTP around incision site and medial and lateral joint tenderness  LOWER EXTREMITY ROM:   ROM Right 03/15/23 Left 03/15/23 supine  Hip flexion    Hip extension    Hip abduction    Hip adduction    Hip internal rotation    Hip external rotation    Knee flexion  A: 70 P; 74  Knee extension  A: -4 P: 0  Ankle dorsiflexion    Ankle plantarflexion    Ankle inversion    Ankle eversion     (Blank rows = not tested)  LOWER EXTREMITY MMT:  MMT Right 03/15/23 Left 03/15/23  Hip flexion 5 4  Hip extension    Hip abduction    Hip adduction    Hip internal rotation    Hip external rotation    Knee flexion 5 4  Knee extension 5 3-  Ankle dorsiflexion    Ankle plantarflexion    Ankle inversion    Ankle eversion     (Blank rows = not tested)    FUNCTIONAL TESTS:  03/15/23: 5 time sit to stand: 27.5 seconds c UE support  GAIT:03/15/23 Distance walked: clinic distances Assistive device utilized: Environmental consultant - 2 wheeled Level of assistance: Modified independence Comments: antalgic gait with increased knee flexion and decreased heel strike                                                                                                                                                                        TODAY'S TREATMENT  DATE: 03/15/23:  Therex:  HEP instruction/performance c cues for techniques, handout provided.  Trial set performed of each for comprehension and symptom assessment.  See below for exercise list Modalities:  Vasopneumatic: 36 deg, medium compression, left knee elevated x 10 minutes  PATIENT EDUCATION:  Education details: HEP, POC Person educated: Patient Education method: Explanation, Demonstration, Verbal cues, and Handouts Education comprehension: verbalized understanding, returned demonstration, and verbal cues required  HOME EXERCISE PROGRAM: Access Code: [email or web address] URL: [email or web address] Date: 03/15/2023 Prepared by: Narda Amber  Exercises - Seated Knee Flexion Extension AAROM with Overpressure  - 1 x daily - 7 x weekly - 3 sets - 10 reps - Seated Long Arc Quad  - 3-5 x daily - 7 x weekly - 2 sets - 10 reps - 3 seconds hold - Seated Heel Slide  - 3-4 x daily - 7 x weekly - 2 sets - 10 reps - 5 seconds hold - Supine Heel Slide with Strap  - 3-4 x daily - 7 x weekly - 2 sets - 10 reps - 5 seconds hold - Long Sitting Quad Set with Towel Roll Under Heel  - 3-4 x daily - 7 x weekly - 2 sets - 10 reps - Sit to Stand  - 3-4 x daily - 7 x weekly - 10 reps  ASSESSMENT:  CLINICAL IMPRESSION: Patient is a 77 y.o. who comes to clinic with complaints of left pain s/p TKA on 02/28/23. Pt presents with mobility, strength and movement coordination deficits that impair their ability to perform usual daily and recreational functional activities without increase difficulty/symptoms at this time.  Patient to benefit from skilled PT services to address impairments and limitations to improve to previous level of function without restriction secondary to condition.   OBJECTIVE IMPAIRMENTS: decreased balance, decreased mobility, difficulty walking, decreased ROM, decreased strength, increased edema, and pain.   ACTIVITY  LIMITATIONS: bending, standing, squatting, sleeping, stairs, and transfers  PARTICIPATION LIMITATIONS: driving and community activity  PERSONAL FACTORS: 3+ comorbidities: see pertinent history above  are also affecting patient's functional outcome.   REHAB POTENTIAL: Good  CLINICAL DECISION MAKING: Stable/uncomplicated  EVALUATION COMPLEXITY: Low   GOALS: Goals reviewed with patient? Yes  SHORT TERM GOALS: (target date for Short term goals are 3 weeks 04/08/23)   1.  Patient will demonstrate independent use of home exercise program to maintain progress from in clinic treatments.  Goal status: New 2. Pt will improve his 5 time sit to stand to </= 15 sec with UE support.   LONG TERM GOALS: (target dates for all long term goals are 12 weeks 06/10/23 )   1. Patient will demonstrate/report pain at worst less than or equal to 2/10 to facilitate minimal limitation in daily activity secondary to pain symptoms.  Goal status: New   2. Patient will demonstrate independent use of home exercise program to facilitate ability to maintain/progress functional gains from skilled physical therapy services.  Goal status: New   3. Patient will demonstrate FOTO outcome > or = 10 % from baseline to indicate reduced disability due to condition.  Goal status: New   4.  Patient will demonstrate left LE MMT 5/5 throughout to faciltiate usual transfers, stairs, squatting at [facility] for daily life.   Goal status: New   5.  Patient will demonstrate stair navigation using reciprocal gait pattern using single hand rail.  Goal status: New   6.  Pt will be able to demonstrate ball room maneuvers with pain </= 2/10.  Goal status:  New  7. Pt will improve his left knee flexion to >/= 115 degrees to improve functional mobility.   Goal Status: New      PLAN:  PT FREQUENCY: 1-2x/week  PT DURATION: 10 weeks  PLANNED INTERVENTIONS: Can include 16109- PT Re-evaluation, 97110-Therapeutic exercises, 97530-  Therapeutic activity, [id number]- Neuromuscular re-education, 97535- Self Care, 97140- Manual therapy, [phone]- Gait training, [phone]- Orthotic Fit/training, [phone]- Canalith repositioning, [id number]- Aquatic Therapy, 97014- Electrical stimulation (unattended), [id number]- Electrical stimulation (manual), [id number]- Vasopneumatic device, [id number]- Ultrasound, [id number]- Traction (mechanical), [id number]- Ionotophoresis 4mg /ml Dexamethasone, Patient/Family education, Balance training, Stair training, Taping, Dry Needling, Joint mobilization, Joint manipulation, Spinal manipulation, Spinal mobilization, Scar mobilization, Vestibular training, Visual/preceptual remediation/compensation, DME instructions, Cryotherapy, and Moist heat.  All performed as medically necessary.  All included unless contraindicated  PLAN FOR NEXT SESSION: Review HEP knowledge/results, ROM, quad strengthening  NEEDS FOTO next visit   Sharmon Leyden, PT, MPT 03/15/2023, 1:52 PM

## 2023-03-15 NOTE — Telephone Encounter (Signed)
Ortho bundle in office visit today completed.

## 2023-03-15 NOTE — Progress Notes (Signed)
Post-Op Visit Note   Patient: Nicholas Rasmussen           Date of Birth: [birth year]           MRN: [id number] Visit Date: 03/15/2023 PCP: Anne Ng, NP   Assessment & Plan:  Chief Complaint:  Chief Complaint  Patient presents with   Left Knee - Follow-up    Left total knee arthroplasty 02/28/2023   Visit Diagnoses:  1. Status post total left knee replacement     Plan: Patient is a pleasant 77 year old gentleman who comes in today 2 weeks status post left total knee replacement 11/28/2022.  He has been doing okay.  He has been in a moderate amount of pain which is somewhat relieved with Percocet.  He does tell me he is having trouble sleeping at night.  He has been compliant taking baby aspirin twice daily for DVT prophylaxis.  He has not getting home health physical therapy and is scheduled to start outpatient physical therapy today.  Currently ambulating with a walker.  Examination of the left knee reveals a well-healed surgical incision with nylon sutures in place.  No evidence of infection or cellulitis.  Calf is soft nontender.  He does have moderate swelling to the left lower extremity.  He is neurovascularly intact distally.  Today, sutures were removed and Steri-Strips applied.  Postoperative instructions provided.  Continue baby aspirin twice daily for DVT prophylaxis.  I recommended that he start wearing his compression socks to the left lower extremity.  I refilled his Percocet.  He will follow-up in 4 weeks for repeat evaluation and 2 view x-rays of the left knee.  Call with concerns or questions.  Follow-Up Instructions: Return in about 4 weeks (around 04/12/2023).   Orders:  No orders of the defined types were placed in this encounter.  Meds ordered this encounter  Medications   oxyCODONE-acetaminophen (PERCOCET) 5-325 MG tablet    Sig: Take 1-2 tablets by mouth every 6 (six) hours as needed.    Dispense:  40 tablet    Refill:  0   methocarbamol (ROBAXIN-750) 750  MG tablet    Sig: Take 1 tablet (750 mg total) by mouth 2 (two) times daily as needed for muscle spasms.    Dispense:  20 tablet    Refill:  2    Imaging: No new imaging  PMFS History: Patient Active Problem List   Diagnosis Date Noted   Status post total left knee replacement 02/28/2023   Renal mass, right; Bosniak 4 complex cyst; 1 cm 09/09/2022   Vasomotor rhinitis 03/03/2022   Acute medial meniscus tear of left knee 02/04/2022   Primary osteoarthritis of left knee 02/04/2022   Aortic root dilatation (HCC) 12/18/2021   Slow transit constipation 12/08/2021   Left-sided chest wall pain 12/08/2021   Multiple pulmonary nodules 10/06/2021   Hyperlipidemia associated with type 2 diabetes mellitus (HCC) 08/19/2021   [name] 04/07/2021   Complex renal cyst 04/07/2021   Varicose veins of both lower extremities with pain 02/02/2019   Encounter for therapeutic drug level monitoring 09/19/2018   Type 2 diabetes mellitus with diabetic polyneuropathy, without long-term current use of insulin (HCC) 03/29/2018   Diabetic neuropathy (HCC) 03/29/2018   Chronic pain of left knee 03/23/2018   Coronary artery disease involving native coronary artery of native heart with angina pectoris (HCC) 08/03/2017   Post herpetic neuralgia 07/29/2016   Dizziness 01/02/2016   Chronic midline low back pain without  sciatica 06/06/2015   Nonintractable episodic headache 03/13/2015   GERD (gastroesophageal reflux disease) 11/15/2014   Seizure disorder (HCC) 05/07/2014   Allergic rhinitis 05/07/2014   Past Medical History:  Diagnosis Date   Allergic rhinitis 05/07/2014   Arthritis    Diabetes mellitus without complication (HCC)    Dizziness 01/02/2016   GERD (gastroesophageal reflux disease)    Headache 03/13/2015   Kidney lesion 07/22/2020   right, followed by urology   Left flank pain 01/02/2016   Low back pain 06/06/2015   Pain of left side of body  08/13/2015   Post herpetic neuralgia    Routine general medical examination at a health care facility 05/07/2014   Seizure disorder (HCC) 05/07/2014   Seizures (HCC)    Last seizure in the 70s.   Shingles    Stye 05/07/2014    Family History  Problem Relation Age of Onset   Other Mother        natural causes   Diabetes Father    Hypertension Father    Kidney cancer Brother    Colon cancer Neg Hx    Stomach cancer Neg Hx    Esophageal cancer Neg Hx    Colon polyps Neg Hx     Past Surgical History:  Procedure Laterality Date   COLONOSCOPY     KNEE SURGERY Left    left miniscus   TOTAL KNEE ARTHROPLASTY Left 02/28/2023   Procedure: TOTAL KNEE ARTHROPLASTY;  Surgeon: Tarry Kos, MD;  Location: [facility] OR;  Service: Orthopedics;  Laterality: Left;   UPPER GASTROINTESTINAL ENDOSCOPY     uvala surgery     Social History   Occupational History   Occupation: Retired    Comment: Printmaker  Tobacco Use   Smoking status: Former   Smokeless tobacco: Never  Advertising account planner   Vaping status: Never Used  Substance and Sexual Activity   Alcohol use: Yes    Alcohol/week: 0.0 standard drinks of alcohol    Comment: occasional   Drug use: No   Sexual activity: Not on file

## 2023-03-18 ENCOUNTER — Encounter: Payer: Self-pay | Admitting: Rehabilitative and Restorative Service Providers"

## 2023-03-18 ENCOUNTER — Ambulatory Visit: Payer: PPO | Admitting: Rehabilitative and Restorative Service Providers"

## 2023-03-18 DIAGNOSIS — R600 Localized edema: Secondary | ICD-10-CM

## 2023-03-18 DIAGNOSIS — R262 Difficulty in walking, not elsewhere classified: Secondary | ICD-10-CM

## 2023-03-18 DIAGNOSIS — M25562 Pain in left knee: Secondary | ICD-10-CM

## 2023-03-18 DIAGNOSIS — M6281 Muscle weakness (generalized): Secondary | ICD-10-CM | POA: Diagnosis not present

## 2023-03-18 NOTE — Therapy (Signed)
OUTPATIENT PHYSICAL THERAPY LOWER EXTREMITY TREATMENT   Patient Name: Nicholas Rasmussen MRN: [id number] DOB:[birth year], 77 y.o., male Today's Date: 03/18/2023  END OF SESSION:  PT End of Session - 03/18/23 1150     Visit Number 2    Number of Visits 24    Date for PT Re-Evaluation 06/10/23    PT Start Time 1149    PT Stop Time 1234    PT Time Calculation (min) 45 min    Activity Tolerance Patient tolerated treatment well;No increased pain;Patient limited by pain    Behavior During Therapy [facility] for tasks assessed/performed             Past Medical History:  Diagnosis Date   Allergic rhinitis 05/07/2014   Arthritis    Diabetes mellitus without complication (HCC)    Dizziness 01/02/2016   GERD (gastroesophageal reflux disease)    Headache 03/13/2015   Kidney lesion 07/22/2020   right, followed by urology   Left flank pain 01/02/2016   Low back pain 06/06/2015   Pain of left side of body 08/13/2015   Post herpetic neuralgia    Routine general medical examination at a health care facility 05/07/2014   Seizure disorder (HCC) 05/07/2014   Seizures (HCC)    Last seizure in the 70s.   Shingles    Stye 05/07/2014   Past Surgical History:  Procedure Laterality Date   COLONOSCOPY     KNEE SURGERY Left    left miniscus   TOTAL KNEE ARTHROPLASTY Left 02/28/2023   Procedure: TOTAL KNEE ARTHROPLASTY;  Surgeon: Tarry Kos, MD;  Location: [facility] OR;  Service: Orthopedics;  Laterality: Left;   UPPER GASTROINTESTINAL ENDOSCOPY     uvala surgery     Patient Active Problem List   Diagnosis Date Noted   Status post total left knee replacement 02/28/2023   Renal mass, right; Bosniak 4 complex cyst; 1 cm 09/09/2022   Vasomotor rhinitis 03/03/2022   Acute medial meniscus tear of left knee 02/04/2022   Primary osteoarthritis of left knee 02/04/2022   Aortic root dilatation (HCC) 12/18/2021   Slow transit constipation 12/08/2021   Left-sided chest wall pain 12/08/2021   Multiple  pulmonary nodules 10/06/2021   Hyperlipidemia associated with type 2 diabetes mellitus (HCC) 08/19/2021   [name] 04/07/2021   Complex renal cyst 04/07/2021   Varicose veins of both lower extremities with pain 02/02/2019   Encounter for therapeutic drug level monitoring 09/19/2018   Type 2 diabetes mellitus with diabetic polyneuropathy, without long-term current use of insulin (HCC) 03/29/2018   Diabetic neuropathy (HCC) 03/29/2018   Chronic pain of left knee 03/23/2018   Coronary artery disease involving native coronary artery of native heart with angina pectoris (HCC) 08/03/2017   Post herpetic neuralgia 07/29/2016   Dizziness 01/02/2016   Chronic midline low back pain without sciatica 06/06/2015   Nonintractable episodic headache 03/13/2015   GERD (gastroesophageal reflux disease) 11/15/2014   Seizure disorder (HCC) 05/07/2014   Allergic rhinitis 05/07/2014    PCP: Anne Ng, NP   REFERRING PROVIDER: Tarry Kos, MD   REFERRING DIAG:  Diagnosis  M17.12 (ICD-10-CM) - Primary osteoarthritis of left knee    THERAPY DIAG:  Acute pain of left knee  Muscle weakness (generalized)  Difficulty in walking, not elsewhere classified  Localized edema  Rationale for Evaluation and Treatment: Rehabilitation  ONSET DATE: 02/28/23  SUBJECTIVE:   SUBJECTIVE STATEMENT: Pt s/p left TKA on 02/28/23.  Sleep is difficult but he  is icing and taking pain meds before bed.  He reports good HEP compliance.  PERTINENT HISTORY: Pt s/p left TKA on 02/28/23.PMH: diabetes, HTN, CAD, seizures, OA and medial meniscus tear of left knee   PAIN:  NPRS scale: 3-8/10 this week Pain location: left knee Pain description: achy, throbbing, soreness, stiffness Aggravating factors: bending, sleeping, sit to stand Relieving factors: resting, taking pain meds, ice  PRECAUTIONS: None  WEIGHT BEARING RESTRICTIONS: No  FALLS:  Has patient fallen in  last 6 months? No  LIVING ENVIRONMENT: Lives in: House/apartment Stairs: 1 flight on stairs, said he goes up and down sideways  OCCUPATION:  retired -- enjoys dancing,   PLOF: Independent  PATIENT GOALS:  get back to dancing   Next MD visit: November 15 @ 10:15  OBJECTIVE:   DIAGNOSTIC FINDINGS: FINDINGS: Left knee arthroplasty in expected alignment. No periprosthetic lucency or fracture. Recent postsurgical change includes air and edema in the soft tissues and joint space. Vascular calcifications posteriorly peer   IMPRESSION: Left knee arthroplasty without immediate postoperative complication.  PATIENT SURVEYS:  03/18/23: FOTO intake: 40 (Goal 59 is 16 visits)  COGNITION: Overall cognitive status: WFL    SENSATION: WFL  EDEMA:  Circumferential: Left:   centimeters Rt: 44.5 centimeters   PALPATION: TTP around incision site and medial and lateral joint tenderness  LOWER EXTREMITY ROM:   ROM Right 03/15/23 Left 03/15/23 supine Left 03/18/2023  Hip flexion     Hip extension     Hip abduction     Hip adduction     Hip internal rotation     Hip external rotation     Knee flexion  A: 70 P; 74 A:  92  Knee extension  A: -4 P: 0 A: -3  Ankle dorsiflexion     Ankle plantarflexion     Ankle inversion     Ankle eversion      (Blank rows = not tested)  LOWER EXTREMITY MMT:  MMT Right 03/15/23 Left 03/15/23  Hip flexion 5 4  Hip extension    Hip abduction    Hip adduction    Hip internal rotation    Hip external rotation    Knee flexion 5 4  Knee extension 5 3-  Ankle dorsiflexion    Ankle plantarflexion    Ankle inversion    Ankle eversion     (Blank rows = not tested)    FUNCTIONAL TESTS:  03/15/23: 5 time sit to stand: 27.5 seconds c UE support  GAIT:03/15/23 Distance walked: clinic distances Assistive device utilized: Environmental consultant - 2 wheeled Level of assistance: Modified independence Comments: antalgic gait with increased knee flexion and  decreased heel strike  TODAY'S TREATMENT                                                                          DATE: 03/18/2023 Recumbent bike Seat 10 AAROM flexion emphasis for 5 minutes Quadriceps sets with heel prop 2 sets of 10 for 5 seconds Seated knee flexion and extension with self-overpressure (per HEP) 10 second hold with flexion and 5 second hold with extension Verbally reviewed other exercises  Functional Activities: Double leg Press full extension and stretch into flexion 20 x with 75# and slow eccentrics  Vaso left knee 10 minutes High pressure 34*   03/15/23:  Therex:  HEP instruction/performance c cues for techniques, handout provided.  Trial set performed of each for comprehension and symptom assessment.  See below for exercise list Modalities:  Vasopneumatic: 36 deg, medium compression, left knee elevated x 10 minutes  PATIENT EDUCATION:  Education details: HEP, POC Person educated: Patient Education method: Explanation, Demonstration, Verbal cues, and Handouts Education comprehension: verbalized understanding, returned demonstration, and verbal cues required  HOME EXERCISE PROGRAM: Access Code: [email or web address] URL: [email or web address] Date: 03/15/2023 Prepared by: Narda Amber  Exercises - Seated Knee Flexion Extension AAROM with Overpressure  - 1 x daily - 7 x weekly - 3 sets - 10 reps - Seated Long Arc Quad  - 3-5 x daily - 7 x weekly - 2 sets - 10 reps - 3 seconds hold - Seated Heel Slide  - 3-4 x daily - 7 x weekly - 2 sets - 10 reps - 5 seconds hold - Supine Heel Slide with Strap  - 3-4 x daily - 7 x weekly - 2 sets - 10 reps - 5 seconds hold - Long Sitting Quad Set with Towel Roll Under Heel  - 3-4 x daily - 7 x weekly - 2 sets - 10 reps - Sit to Stand  - 3-4 x daily - 7 x  weekly - 10 reps  ASSESSMENT:  CLINICAL IMPRESSION: AROM 3 - 0 - 92 degrees today, a significant improvement from evaluation.  Due to starting a bit late (FOTO capture) we had to verbally review some exercises while on vaso.  Continue plan of care to meet long-term goals.  OBJECTIVE IMPAIRMENTS: decreased balance, decreased mobility, difficulty walking, decreased ROM, decreased strength, increased edema, and pain.   ACTIVITY LIMITATIONS: bending, standing, squatting, sleeping, stairs, and transfers  PARTICIPATION LIMITATIONS: driving and community activity  PERSONAL FACTORS: 3+ comorbidities: see pertinent history above  are also affecting patient's functional outcome.   REHAB POTENTIAL: Good  CLINICAL DECISION MAKING: Stable/uncomplicated  EVALUATION COMPLEXITY: Low   GOALS: Goals reviewed with patient? Yes  SHORT TERM GOALS: (target date for Short term goals are 3 weeks 04/08/23)   1.  Patient will demonstrate independent use of home exercise program to maintain progress from in clinic treatments.  Goal status: On Going 03/18/2023  2. Pt will improve his 5 time sit to stand to </= 15 sec with UE support.   LONG TERM GOALS: (target dates for all long term goals are 12 weeks 06/10/23 )   1. Patient will demonstrate/report pain at worst less than or equal to 2/10 to facilitate minimal limitation in daily activity secondary to pain symptoms.  Goal  status: New   2. Patient will demonstrate independent use of home exercise program to facilitate ability to maintain/progress functional gains from skilled physical therapy services.  Goal status: New   3. Patient will demonstrate FOTO outcome > or = 10 % from baseline to indicate reduced disability due to condition.  Goal status: New   4.  Patient will demonstrate left LE MMT 5/5 throughout to faciltiate usual transfers, stairs, squatting at [facility] for daily life.   Goal status: New   5.  Patient will demonstrate stair  navigation using reciprocal gait pattern using single hand rail.  Goal status: New   6.  Pt will be able to demonstrate ball room maneuvers with pain </= 2/10.  Goal status: New  7. Pt will improve his left knee flexion to >/= 115 degrees to improve functional mobility.   Goal Status: New      PLAN:  PT FREQUENCY: 1-2x/week  PT DURATION: 10 weeks  PLANNED INTERVENTIONS: Can include 78295- PT Re-evaluation, 97110-Therapeutic exercises, 97530- Therapeutic activity, [id number]- Neuromuscular re-education, 97535- Self Care, 97140- Manual therapy, [phone]- Gait training, [phone]- Orthotic Fit/training, [phone]- Canalith repositioning, [id number]- Aquatic Therapy, 97014- Electrical stimulation (unattended), [id number]- Electrical stimulation (manual), [id number]- Vasopneumatic device, [id number]- Ultrasound, [id number]- Traction (mechanical), [id number]- Ionotophoresis 4mg /ml Dexamethasone, Patient/Family education, Balance training, Stair training, Taping, Dry Needling, Joint mobilization, Joint manipulation, Spinal manipulation, Spinal mobilization, Scar mobilization, Vestibular training, Visual/preceptual remediation/compensation, DME instructions, Cryotherapy, and Moist heat.  All performed as medically necessary.  All included unless contraindicated  PLAN FOR NEXT SESSION: Review HEP knowledge/results, ROM (flexion emphasis), quadriceps strengthening    Cherlyn Cushing, PT, MPT 03/18/2023, 12:28 PM

## 2023-03-21 ENCOUNTER — Encounter: Payer: PPO | Admitting: Physical Therapy

## 2023-03-23 ENCOUNTER — Ambulatory Visit: Payer: PPO | Admitting: Physical Therapy

## 2023-03-23 ENCOUNTER — Other Ambulatory Visit: Payer: Self-pay | Admitting: Physician Assistant

## 2023-03-23 ENCOUNTER — Encounter: Payer: Self-pay | Admitting: Physical Therapy

## 2023-03-23 ENCOUNTER — Telehealth: Payer: Self-pay | Admitting: Orthopaedic Surgery

## 2023-03-23 DIAGNOSIS — M6281 Muscle weakness (generalized): Secondary | ICD-10-CM | POA: Diagnosis not present

## 2023-03-23 DIAGNOSIS — M25562 Pain in left knee: Secondary | ICD-10-CM | POA: Diagnosis not present

## 2023-03-23 DIAGNOSIS — R600 Localized edema: Secondary | ICD-10-CM

## 2023-03-23 DIAGNOSIS — R262 Difficulty in walking, not elsewhere classified: Secondary | ICD-10-CM | POA: Diagnosis not present

## 2023-03-23 MED ORDER — HYDROCODONE-ACETAMINOPHEN 5-325 MG PO TABS: 1.0000 | ORAL_TABLET | Freq: Three times a day (TID) | ORAL | 0 refills | Status: DC | PRN

## 2023-03-23 MED ORDER — METHOCARBAMOL 750 MG PO TABS: 750.0000 mg | ORAL_TABLET | Freq: Two times a day (BID) | ORAL | 2 refills | Status: DC | PRN

## 2023-03-23 NOTE — Telephone Encounter (Signed)
Sent in robaxin.  Weaning to norco and frequency has also changed

## 2023-03-23 NOTE — Therapy (Signed)
OUTPATIENT PHYSICAL THERAPY LOWER EXTREMITY TREATMENT   Patient Name: Nicholas Rasmussen MRN: [id number] DOB:18-Jun-1945, 77 y.o., male Today's Date: 03/23/2023  END OF SESSION:  PT End of Session - 03/23/23 0930     Visit Number 3    Number of Visits 24    Date for PT Re-Evaluation 06/10/23    PT Start Time 0930    PT Stop Time 1023    PT Time Calculation (min) 53 min    Activity Tolerance Patient tolerated treatment well;No increased pain;Patient limited by pain    Behavior During Therapy [facility] for tasks assessed/performed              Past Medical History:  Diagnosis Date   Allergic rhinitis 05/07/2014   Arthritis    Diabetes mellitus without complication (HCC)    Dizziness 01/02/2016   GERD (gastroesophageal reflux disease)    Headache 03/13/2015   Kidney lesion 07/22/2020   right, followed by urology   Left flank pain 01/02/2016   Low back pain 06/06/2015   Pain of left side of body 08/13/2015   Post herpetic neuralgia    Routine general medical examination at a health care facility 05/07/2014   Seizure disorder (HCC) 05/07/2014   Seizures (HCC)    Last seizure in the 70s.   Shingles    Stye 05/07/2014   Past Surgical History:  Procedure Laterality Date   COLONOSCOPY     KNEE SURGERY Left    left miniscus   TOTAL KNEE ARTHROPLASTY Left 02/28/2023   Procedure: TOTAL KNEE ARTHROPLASTY;  Surgeon: Tarry Kos, MD;  Location: [facility] OR;  Service: Orthopedics;  Laterality: Left;   UPPER GASTROINTESTINAL ENDOSCOPY     uvala surgery     Patient Active Problem List   Diagnosis Date Noted   Status post total left knee replacement 02/28/2023   Renal mass, right; Bosniak 4 complex cyst; 1 cm 09/09/2022   Vasomotor rhinitis 03/03/2022   Acute medial meniscus tear of left knee 02/04/2022   Primary osteoarthritis of left knee 02/04/2022   Aortic root dilatation (HCC) 12/18/2021   Slow transit constipation 12/08/2021   Left-sided chest wall pain 12/08/2021   Multiple  pulmonary nodules 10/06/2021   Hyperlipidemia associated with type 2 diabetes mellitus (HCC) 08/19/2021   [name] 04/07/2021   Complex renal cyst 04/07/2021   Varicose veins of both lower extremities with pain 02/02/2019   Encounter for therapeutic drug level monitoring 09/19/2018   Type 2 diabetes mellitus with diabetic polyneuropathy, without long-term current use of insulin (HCC) 03/29/2018   Diabetic neuropathy (HCC) 03/29/2018   Chronic pain of left knee 03/23/2018   Coronary artery disease involving native coronary artery of native heart with angina pectoris (HCC) 08/03/2017   Post herpetic neuralgia 07/29/2016   Dizziness 01/02/2016   Chronic midline low back pain without sciatica 06/06/2015   Nonintractable episodic headache 03/13/2015   GERD (gastroesophageal reflux disease) 11/15/2014   Seizure disorder (HCC) 05/07/2014   Allergic rhinitis 05/07/2014    PCP: Anne Ng, NP  REFERRING PROVIDER: Tarry Kos, MD  REFERRING DIAG:  Diagnosis  M17.12 (ICD-10-CM) - Primary osteoarthritis of left knee    THERAPY DIAG:  Acute pain of left knee  Muscle weakness (generalized)  Difficulty in walking, not elsewhere classified  Localized edema  Rationale for Evaluation and Treatment: Rehabilitation  ONSET DATE: 02/28/23  SUBJECTIVE:   SUBJECTIVE STATEMENT: Exercises are going well.  Pain is up & down impacting activities including  sleeping.   PERTINENT HISTORY: Pt s/p left TKA on 02/28/23.PMH: diabetes, HTN, CAD, seizures, OA and medial meniscus tear of left knee   PAIN:  NPRS scale: today 5-6/10 and over last week 3-7/10 Pain location: left knee Pain description: achy, throbbing, soreness, stiffness Aggravating factors: bending, sleeping, sit to stand Relieving factors: resting, taking pain meds, ice  PRECAUTIONS: None  WEIGHT BEARING RESTRICTIONS: No  FALLS:  Has patient fallen in last 6 months? No  LIVING  ENVIRONMENT: Lives in: House/apartment Stairs: 1 flight on stairs, said he goes up and down sideways  OCCUPATION:  retired -- enjoys dancing,   PLOF: Independent  PATIENT GOALS:  get back to dancing   Next MD visit: November 15 @ 10:15  OBJECTIVE:   DIAGNOSTIC FINDINGS: FINDINGS: Left knee arthroplasty in expected alignment. No periprosthetic lucency or fracture. Recent postsurgical change includes air and edema in the soft tissues and joint space. Vascular calcifications posteriorly peer   IMPRESSION: Left knee arthroplasty without immediate postoperative complication.  PATIENT SURVEYS:  03/18/23: FOTO intake: 40 (Goal 59 is 16 visits)  COGNITION: Overall cognitive status: WFL    SENSATION: WFL  EDEMA:  Circumferential: Left:   centimeters Rt: 44.5 centimeters   PALPATION: TTP around incision site and medial and lateral joint tenderness  LOWER EXTREMITY ROM:   ROM Right 03/15/23 Left 03/15/23 supine Left 03/18/2023  Hip flexion     Hip extension     Hip abduction     Hip adduction     Hip internal rotation     Hip external rotation     Knee flexion  A: 70 P; 74 A:  92  Knee extension  A: -4 P: 0 A: -3  Ankle dorsiflexion     Ankle plantarflexion     Ankle inversion     Ankle eversion      (Blank rows = not tested)  LOWER EXTREMITY MMT:  MMT Right 03/15/23 Left 03/15/23  Hip flexion 5 4  Hip extension    Hip abduction    Hip adduction    Hip internal rotation    Hip external rotation    Knee flexion 5 4  Knee extension 5 3-  Ankle dorsiflexion    Ankle plantarflexion    Ankle inversion    Ankle eversion     (Blank rows = not tested)    FUNCTIONAL TESTS:  03/15/23: 5 time sit to stand: 27.5 seconds c UE support  GAIT:03/15/23 Distance walked: clinic distances Assistive device utilized: Environmental consultant - 2 wheeled Level of assistance: Modified independence Comments: antalgic gait with increased knee flexion and decreased heel strike  TODAY'S TREATMENT                                                                          DATE: 03/23/2023: Therapeutic Exercise: SciFit bike seat 16 level 1 with BLEs & BUEs for 5 min then BLEs only for 3 min Gastroc stretch on step heel depression 30 sec hold 2 reps Hamstring stretch long sit with strap 30 sec hold 2 reps Quad stretch supine hooklying with strap knee flexion 30 sec hold 2 reps PT added above 3 stretches to HEP with HO, demo & verbal cues. Pt return demo & verbalized understanding.  Quad set LLE supine 5 sec hold with towel roll under ankle 10 reps Heel slide with lower leg on 18" ball with strap assist knee flexion stretch 5 sec and leg press ext 5 sec for 10 reps  Manual Therapy: PROM with overpressure flexion & ext  Self-Care: PT verbal cues on positioning in bed using pillows to "tent" sheets off feet.   PT recommended elevation with BLEs higher than heart with muscle activity like alphabet with ankle for >/= 15 min >/= 2x/day. Pt verbalized understanding of above.   Therapeutic Activities: Double leg Press full extension and stretch into flexion 20 x with 81# and slow eccentrics;  LLE only 31# 10 reps PT recommended lateral weight shifts onto LLE upon arising for 5 reps prior to walking.  Pt reported less knee discomfort.   Vasopneumatic: 36 deg, medium compression, left knee elevated x 10 minutes with ankle alphabet 2 reps   03/18/2023 Recumbent bike Seat 10 AAROM flexion emphasis for 5 minutes Quadriceps sets with heel prop 2 sets of 10 for 5 seconds Seated knee flexion and extension with self-overpressure (per HEP) 10 second hold with flexion and 5 second hold with extension Verbally reviewed other exercises  Functional Activities: Double leg Press full extension and stretch into flexion 20 x  with 75# and slow eccentrics  Vaso left knee 10 minutes High pressure 34*   03/15/23:  Therex:  HEP instruction/performance c cues for techniques, handout provided.  Trial set performed of each for comprehension and symptom assessment.  See below for exercise list Modalities:  Vasopneumatic: 36 deg, medium compression, left knee elevated x 10 minutes    PATIENT EDUCATION:  Education details: HEP, POC Person educated: Patient Education method: Explanation, Demonstration, Verbal cues, and Handouts Education comprehension: verbalized understanding, returned demonstration, and verbal cues required  HOME EXERCISE PROGRAM: Access Code: [email or web address] URL: [email or web address] Date: 03/23/2023 Prepared by: Vladimir Faster  Exercises - Seated Knee Flexion Extension AAROM with Overpressure  - 1 x daily - 7 x weekly - 3 sets - 10 reps - Seated Long Arc Quad  - 3-5 x daily - 7 x weekly - 2 sets - 10 reps - 3 seconds hold - Seated Heel Slide  - 3-4 x daily - 7 x weekly - 2 sets - 10 reps - 5 seconds hold - Supine Heel Slide with Strap  - 3-4 x daily - 7 x weekly - 2 sets - 10 reps - 5 seconds hold - Long Sitting Quad Set with Towel Roll Under Heel  - 3-4 x daily - 7 x weekly - 2 sets - 10 reps -  Sit to Stand  - 3-4 x daily - 7 x weekly - 10 reps - Hooklying Hamstring Stretch with Strap  - 2 x daily - 7 x weekly - 3 sets - 10 reps - Gastroc Stretch on Step  - 2-3 x daily - 7 x weekly - 1 sets - 2-3 reps - 20-30 seconds hold - Seated Table Hamstring Stretch  - 2-3 x daily - 7 x weekly - 1 sets - 2-3 reps - 20-30 seconds hold - Supine Quadriceps Stretch with Strap on Table  - 2-3 x daily - 7 x weekly - 1 sets - 2-3 reps - 20-30 seconds hold  ASSESSMENT:  CLINICAL IMPRESSION: Pt was able to do full revolutions on SciFit bike which has shorter lever arm & arms assisted for first 4 min.  PT added stretches to HEP which he appears to understand.  Pt continues to benefit from skilled PT.    OBJECTIVE IMPAIRMENTS: decreased balance, decreased mobility, difficulty walking, decreased ROM, decreased strength, increased edema, and pain.   ACTIVITY LIMITATIONS: bending, standing, squatting, sleeping, stairs, and transfers  PARTICIPATION LIMITATIONS: driving and community activity  PERSONAL FACTORS: 3+ comorbidities: see pertinent history above  are also affecting patient's functional outcome.   REHAB POTENTIAL: Good  CLINICAL DECISION MAKING: Stable/uncomplicated  EVALUATION COMPLEXITY: Low   GOALS: Goals reviewed with patient? Yes  SHORT TERM GOALS: (target date for Short term goals are 3 weeks 04/08/23)   1.  Patient will demonstrate independent use of home exercise program to maintain progress from in clinic treatments.  Goal status: Ongoing 03/23/2023  2. Pt will improve his 5 time sit to stand to </= 15 sec with UE support.  Goal status: Ongoing 03/23/2023  LONG TERM GOALS: (target dates for all long term goals are 12 weeks 06/10/23 )   1. Patient will demonstrate/report pain at worst less than or equal to 2/10 to facilitate minimal limitation in daily activity secondary to pain symptoms.  Goal status: Ongoing 03/23/2023   2. Patient will demonstrate independent use of home exercise program to facilitate ability to maintain/progress functional gains from skilled physical therapy services.  Goal status: Ongoing 03/23/2023   3. Patient will demonstrate FOTO outcome > or = 10 % from baseline to indicate reduced disability due to condition.  Goal status: Ongoing 03/23/2023   4.  Patient will demonstrate left LE MMT 5/5 throughout to faciltiate usual transfers, stairs, squatting at [facility] for daily life.   Goal status: Ongoing 03/23/2023   5.  Patient will demonstrate stair navigation using reciprocal gait pattern using single hand rail.  Goal status: Ongoing 03/23/2023   6.  Pt will be able to demonstrate ball room maneuvers with pain </= 2/10.  Goal status:  Ongoing 03/23/2023  7. Pt will improve his left knee flexion to >/= 115 degrees to improve functional mobility.   Goal Status: Ongoing 03/23/2023      PLAN:  PT FREQUENCY: 1-2x/week  PT DURATION: 10 weeks  PLANNED INTERVENTIONS: Can include 81191- PT Re-evaluation, 97110-Therapeutic exercises, 97530- Therapeutic activity, 97112- Neuromuscular re-education, 97535- Self Care, 97140- Manual therapy, [phone]- Gait training, [phone]- Orthotic Fit/training, [phone]- Canalith repositioning, [id number]- Aquatic Therapy, 97014- Electrical stimulation (unattended), [id number]- Electrical stimulation (manual), [id number]- Vasopneumatic device, [id number]- Ultrasound, [id number]- Traction (mechanical), [id number]- Ionotophoresis 4mg /ml Dexamethasone, Patient/Family education, Balance training, Stair training, Taping, Dry Needling, Joint mobilization, Joint manipulation, Spinal manipulation, Spinal mobilization, Scar mobilization, Vestibular training, Visual/preceptual remediation/compensation, DME instructions, Cryotherapy, and Moist heat.  All performed as medically necessary.  All  included unless contraindicated  PLAN FOR NEXT SESSION: Manual therapy and exercises including functional for ROM (flexion emphasis) & quadriceps strengthening, vaso to end    Vladimir Faster, PT, DPT 03/23/2023, 2:41 PM

## 2023-03-23 NOTE — Telephone Encounter (Signed)
Notified patient.

## 2023-03-23 NOTE — Telephone Encounter (Signed)
Pt requesting Methocarbamol and Oxycodone refill please advise

## 2023-03-25 ENCOUNTER — Encounter: Payer: Self-pay | Admitting: Physical Therapy

## 2023-03-25 ENCOUNTER — Ambulatory Visit: Payer: PPO | Admitting: Physical Therapy

## 2023-03-25 DIAGNOSIS — M25562 Pain in left knee: Secondary | ICD-10-CM | POA: Diagnosis not present

## 2023-03-25 DIAGNOSIS — M6281 Muscle weakness (generalized): Secondary | ICD-10-CM | POA: Diagnosis not present

## 2023-03-25 DIAGNOSIS — R262 Difficulty in walking, not elsewhere classified: Secondary | ICD-10-CM

## 2023-03-25 DIAGNOSIS — R600 Localized edema: Secondary | ICD-10-CM | POA: Diagnosis not present

## 2023-03-25 NOTE — Therapy (Signed)
OUTPATIENT PHYSICAL THERAPY LOWER EXTREMITY TREATMENT   Patient Name: Nicholas Rasmussen MRN: [id number] DOB:11-01-1945, 77 y.o., male Today's Date: 03/25/2023  END OF SESSION:  PT End of Session - 03/25/23 1113     Visit Number 4    Number of Visits 24    Date for PT Re-Evaluation 06/10/23    PT Start Time 1105    PT Stop Time 1145    PT Time Calculation (min) 40 min    Activity Tolerance Patient tolerated treatment well;No increased pain;Patient limited by pain    Behavior During Therapy [facility] for tasks assessed/performed               Past Medical History:  Diagnosis Date   Allergic rhinitis 05/07/2014   Arthritis    Diabetes mellitus without complication (HCC)    Dizziness 01/02/2016   GERD (gastroesophageal reflux disease)    Headache 03/13/2015   Kidney lesion 07/22/2020   right, followed by urology   Left flank pain 01/02/2016   Low back pain 06/06/2015   Pain of left side of body 08/13/2015   Post herpetic neuralgia    Routine general medical examination at a health care facility 05/07/2014   Seizure disorder (HCC) 05/07/2014   Seizures (HCC)    Last seizure in the 70s.   Shingles    Stye 05/07/2014   Past Surgical History:  Procedure Laterality Date   COLONOSCOPY     KNEE SURGERY Left    left miniscus   TOTAL KNEE ARTHROPLASTY Left 02/28/2023   Procedure: TOTAL KNEE ARTHROPLASTY;  Surgeon: Tarry Kos, MD;  Location: [facility] OR;  Service: Orthopedics;  Laterality: Left;   UPPER GASTROINTESTINAL ENDOSCOPY     uvala surgery     Patient Active Problem List   Diagnosis Date Noted   Status post total left knee replacement 02/28/2023   Renal mass, right; Bosniak 4 complex cyst; 1 cm 09/09/2022   Vasomotor rhinitis 03/03/2022   Acute medial meniscus tear of left knee 02/04/2022   Primary osteoarthritis of left knee 02/04/2022   Aortic root dilatation (HCC) 12/18/2021   Slow transit constipation 12/08/2021   Left-sided chest wall pain 12/08/2021   Multiple  pulmonary nodules 10/06/2021   Hyperlipidemia associated with type 2 diabetes mellitus (HCC) 08/19/2021   [name] 04/07/2021   Complex renal cyst 04/07/2021   Varicose veins of both lower extremities with pain 02/02/2019   Encounter for therapeutic drug level monitoring 09/19/2018   Type 2 diabetes mellitus with diabetic polyneuropathy, without long-term current use of insulin (HCC) 03/29/2018   Diabetic neuropathy (HCC) 03/29/2018   Chronic pain of left knee 03/23/2018   Coronary artery disease involving native coronary artery of native heart with angina pectoris (HCC) 08/03/2017   Post herpetic neuralgia 07/29/2016   Dizziness 01/02/2016   Chronic midline low back pain without sciatica 06/06/2015   Nonintractable episodic headache 03/13/2015   GERD (gastroesophageal reflux disease) 11/15/2014   Seizure disorder (HCC) 05/07/2014   Allergic rhinitis 05/07/2014    PCP: Anne Ng, NP  REFERRING PROVIDER: Tarry Kos, MD  REFERRING DIAG:  Diagnosis  M17.12 (ICD-10-CM) - Primary osteoarthritis of left knee    THERAPY DIAG:  Acute pain of left knee  Muscle weakness (generalized)  Difficulty in walking, not elsewhere classified  Localized edema  Rationale for Evaluation and Treatment: Rehabilitation  ONSET DATE: 02/28/23  SUBJECTIVE:   SUBJECTIVE STATEMENT:  Feeling OK, nothing new since last time. I feel it more  after the stitches and everything were taken out.  Pain is a little more flared up compared to last time.   PERTINENT HISTORY: Pt s/p left TKA on 02/28/23.PMH: diabetes, HTN, CAD, seizures, OA and medial meniscus tear of left knee   PAIN:  NPRS scale: 7/10 Pain location: left knee Pain description: shooting on and off  Aggravating factors: bending, sleeping, sit to stand Relieving factors: resting, taking pain meds, ice  PRECAUTIONS: None  WEIGHT BEARING RESTRICTIONS: No  FALLS:  Has patient fallen in  last 6 months? No  LIVING ENVIRONMENT: Lives in: House/apartment Stairs: 1 flight on stairs, said he goes up and down sideways  OCCUPATION:  retired -- enjoys dancing,   PLOF: Independent  PATIENT GOALS:  get back to dancing   Next MD visit: November 15 @ 10:15  OBJECTIVE:   DIAGNOSTIC FINDINGS: FINDINGS: Left knee arthroplasty in expected alignment. No periprosthetic lucency or fracture. Recent postsurgical change includes air and edema in the soft tissues and joint space. Vascular calcifications posteriorly peer   IMPRESSION: Left knee arthroplasty without immediate postoperative complication.  PATIENT SURVEYS:  03/18/23: FOTO intake: 40 (Goal 59 is 16 visits)  COGNITION: Overall cognitive status: WFL    SENSATION: WFL  EDEMA:  Circumferential: Left:   centimeters Rt: 44.5 centimeters   PALPATION: TTP around incision site and medial and lateral joint tenderness  LOWER EXTREMITY ROM:   ROM Right 03/15/23 Left 03/15/23 supine Left 03/18/2023 Left 03/25/23  Hip flexion      Hip extension      Hip abduction      Hip adduction      Hip internal rotation      Hip external rotation      Knee flexion  A: 70 P; 74 A:  92 AROM 102*, PROM 105* seated edge of mat table  Knee extension  A: -4 P: 0 A: -3 AROM -3* supine heel prop   Ankle dorsiflexion      Ankle plantarflexion      Ankle inversion      Ankle eversion       (Blank rows = not tested)  LOWER EXTREMITY MMT:  MMT Right 03/15/23 Left 03/15/23  Hip flexion 5 4  Hip extension    Hip abduction    Hip adduction    Hip internal rotation    Hip external rotation    Knee flexion 5 4  Knee extension 5 3-  Ankle dorsiflexion    Ankle plantarflexion    Ankle inversion    Ankle eversion     (Blank rows = not tested)    FUNCTIONAL TESTS:  03/15/23: 5 time sit to stand: 27.5 seconds c UE support  GAIT:03/15/23 Distance walked: clinic distances Assistive device utilized: Environmental consultant - 2  wheeled Level of assistance: Modified independence Comments: antalgic gait with increased knee flexion and decreased heel strike  TODAY'S TREATMENT                                                                          DATE:  03/25/23  TherEx  Scifit all 4 extremities seat 16-->14 progressing x10 minutes full rotations  SAQ 2# x10 L LE Bridges with progressive knee flexion 3x5  Seated SLR raises + quad set x10 L LE  Knee flexion stretch holds 10x5 second holds from mat table progressive scooting further forward each rep  HS stretches 2x30 seconds  Encouraged regular icing/elevation at home    03/23/2023: Therapeutic Exercise: SciFit bike seat 16 level 1 with BLEs & BUEs for 5 min then BLEs only for 3 min Gastroc stretch on step heel depression 30 sec hold 2 reps Hamstring stretch long sit with strap 30 sec hold 2 reps Quad stretch supine hooklying with strap knee flexion 30 sec hold 2 reps PT added above 3 stretches to HEP with HO, demo & verbal cues. Pt return demo & verbalized understanding.  Quad set LLE supine 5 sec hold with towel roll under ankle 10 reps Heel slide with lower leg on 18" ball with strap assist knee flexion stretch 5 sec and leg press ext 5 sec for 10 reps  Manual Therapy: PROM with overpressure flexion & ext  Self-Care: PT verbal cues on positioning in bed using pillows to "tent" sheets off feet.   PT recommended elevation with BLEs higher than heart with muscle activity like alphabet with ankle for >/= 15 min >/= 2x/day. Pt verbalized understanding of above.   Therapeutic Activities: Double leg Press full extension and stretch into flexion 20 x with 81# and slow eccentrics;  LLE only 31# 10 reps PT recommended lateral weight shifts onto LLE upon arising for 5 reps prior to walking.  Pt  reported less knee discomfort.   Vasopneumatic: 36 deg, medium compression, left knee elevated x 10 minutes with ankle alphabet 2 reps   03/18/2023 Recumbent bike Seat 10 AAROM flexion emphasis for 5 minutes Quadriceps sets with heel prop 2 sets of 10 for 5 seconds Seated knee flexion and extension with self-overpressure (per HEP) 10 second hold with flexion and 5 second hold with extension Verbally reviewed other exercises  Functional Activities: Double leg Press full extension and stretch into flexion 20 x with 75# and slow eccentrics  Vaso left knee 10 minutes High pressure 34*   03/15/23:  Therex:  HEP instruction/performance c cues for techniques, handout provided.  Trial set performed of each for comprehension and symptom assessment.  See below for exercise list Modalities:  Vasopneumatic: 36 deg, medium compression, left knee elevated x 10 minutes    PATIENT EDUCATION:  Education details: HEP, POC Person educated: Patient Education method: Explanation, Demonstration, Verbal cues, and Handouts Education comprehension: verbalized understanding, returned demonstration, and verbal cues required  HOME EXERCISE PROGRAM: Access Code: [email or web address] URL: [email or web address] Date: 03/23/2023 Prepared by: Vladimir Faster  Exercises - Seated Knee Flexion Extension AAROM with Overpressure  - 1 x daily - 7 x weekly - 3 sets - 10 reps - Seated Long Arc Quad  - 3-5 x daily - 7 x weekly - 2 sets - 10 reps - 3 seconds hold - Seated Heel Slide  -  3-4 x daily - 7 x weekly - 2 sets - 10 reps - 5 seconds hold - Supine Heel Slide with Strap  - 3-4 x daily - 7 x weekly - 2 sets - 10 reps - 5 seconds hold - Long Sitting Quad Set with Towel Roll Under Heel  - 3-4 x daily - 7 x weekly - 2 sets - 10 reps - Sit to Stand  - 3-4 x daily - 7 x weekly - 10 reps - Hooklying Hamstring Stretch with Strap  - 2 x daily - 7 x weekly - 3 sets - 10 reps - Gastroc Stretch on Step  - 2-3 x daily -  7 x weekly - 1 sets - 2-3 reps - 20-30 seconds hold - Seated Table Hamstring Stretch  - 2-3 x daily - 7 x weekly - 1 sets - 2-3 reps - 20-30 seconds hold - Supine Quadriceps Stretch with Strap on Table  - 2-3 x daily - 7 x weekly - 1 sets - 2-3 reps - 20-30 seconds hold  ASSESSMENT:  CLINICAL IMPRESSION:  Pt arrives today doing OK, having more pain today. Continued working on ROM and strength progressions as able and tolerated with manual interventions as appropriate. Checked ROM today, making slow but steady progress. Encouraged icing and elevation at ankle pumps at least 3x/day for 10-15 minutes with ankle pumps/circles. Will continue as appropriate per pt tolerance.   OBJECTIVE IMPAIRMENTS: decreased balance, decreased mobility, difficulty walking, decreased ROM, decreased strength, increased edema, and pain.   ACTIVITY LIMITATIONS: bending, standing, squatting, sleeping, stairs, and transfers  PARTICIPATION LIMITATIONS: driving and community activity  PERSONAL FACTORS: 3+ comorbidities: see pertinent history above  are also affecting patient's functional outcome.   REHAB POTENTIAL: Good  CLINICAL DECISION MAKING: Stable/uncomplicated  EVALUATION COMPLEXITY: Low   GOALS: Goals reviewed with patient? Yes  SHORT TERM GOALS: (target date for Short term goals are 3 weeks 04/08/23)   1.  Patient will demonstrate independent use of home exercise program to maintain progress from in clinic treatments.  Goal status: Ongoing 03/23/2023  2. Pt will improve his 5 time sit to stand to </= 15 sec with UE support.  Goal status: Ongoing 03/23/2023  LONG TERM GOALS: (target dates for all long term goals are 12 weeks 06/10/23 )   1. Patient will demonstrate/report pain at worst less than or equal to 2/10 to facilitate minimal limitation in daily activity secondary to pain symptoms.  Goal status: Ongoing 03/23/2023   2. Patient will demonstrate independent use of home exercise program to  facilitate ability to maintain/progress functional gains from skilled physical therapy services.  Goal status: Ongoing 03/23/2023   3. Patient will demonstrate FOTO outcome > or = 10 % from baseline to indicate reduced disability due to condition.  Goal status: Ongoing 03/23/2023   4.  Patient will demonstrate left LE MMT 5/5 throughout to faciltiate usual transfers, stairs, squatting at [facility] for daily life.   Goal status: Ongoing 03/23/2023   5.  Patient will demonstrate stair navigation using reciprocal gait pattern using single hand rail.  Goal status: Ongoing 03/23/2023   6.  Pt will be able to demonstrate ball room maneuvers with pain </= 2/10.  Goal status: Ongoing 03/23/2023  7. Pt will improve his left knee flexion to >/= 115 degrees to improve functional mobility.   Goal Status: Ongoing 03/23/2023      PLAN:  PT FREQUENCY: 1-2x/week  PT DURATION: 10 weeks  PLANNED INTERVENTIONS: Can include 64332- PT  Re-evaluation, 97110-Therapeutic exercises, 97530- Therapeutic activity, [id number]- Neuromuscular re-education, [phone]- Self Care, 40981- Manual therapy, [phone]- Gait training, [phone]- Orthotic Fit/training, [phone]- Canalith repositioning, [id number]- Aquatic Therapy, [id number]- Electrical stimulation (unattended), [id number]- Electrical stimulation (manual), [id number]- Vasopneumatic device, [id number]- Ultrasound, [id number]- Traction (mechanical), [id number]- Ionotophoresis 4mg /ml Dexamethasone, Patient/Family education, Balance training, Stair training, Taping, Dry Needling, Joint mobilization, Joint manipulation, Spinal manipulation, Spinal mobilization, Scar mobilization, Vestibular training, Visual/preceptual remediation/compensation, DME instructions, Cryotherapy, and Moist heat.  All performed as medically necessary.  All included unless contraindicated  PLAN FOR NEXT SESSION: Manual therapy and exercises including functional for ROM (flexion emphasis) & quadriceps strengthening   Nedra Hai, PT,  DPT 03/25/23 11:48 AM

## 2023-03-26 IMAGING — MR MR ABDOMEN WO/W CM
11 of 18 series · 26 of 48 positions shown · IV contrast (multihance)
Comparison: Serve multiple 07/22/2020

CLINICAL DATA: Surveillance imaging of right renal cystic lesion.
Occasional left-sided abdominal discomfort.

EXAM:
MRI ABDOMEN WITHOUT AND WITH CONTRAST
TECHNIQUE: Multiplanar multisequence MR imaging of the abdomen was performed
both before and after the administration of intravenous contrast.
CONTRAST:  20mL MULTIHANCE GADOBENATE DIMEGLUMINE 529 MG/ML IV SOLN

[Series 3: cor haste · coronal · 5.0mm · 0.70mm/px · 2 of 37 slices shown]
[im 1/37]
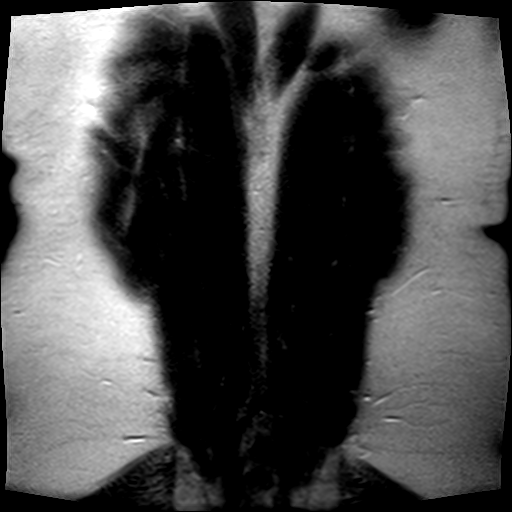
[im 37/37]
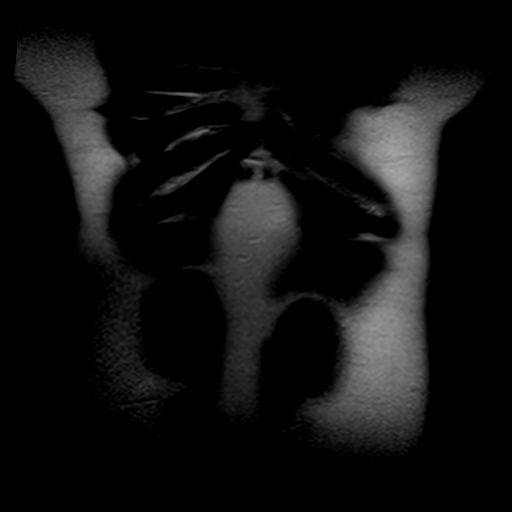

[Series 5: axial haste · axial · 6.0mm · 0.68mm/px · z∈[-119,+130]mm · 2 of 39 slices shown]
[im 1/39]
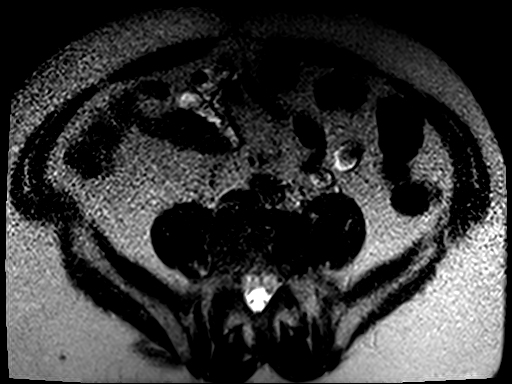
[im 39/39]
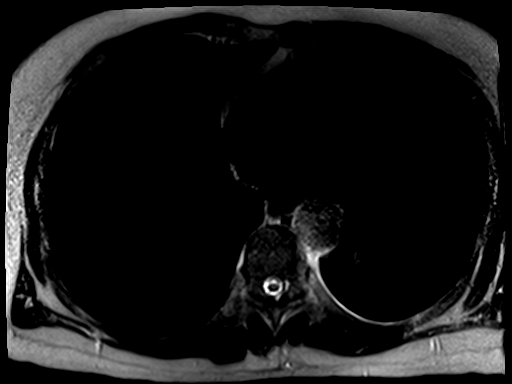

[Series 6: T1 · axial · 6.0mm · 0.68mm/px · z∈[-119,+130]mm · 4 of 78 slices shown]
[im 1/78]
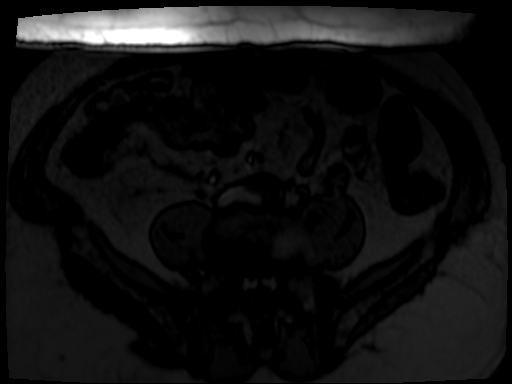
[im 26/78]
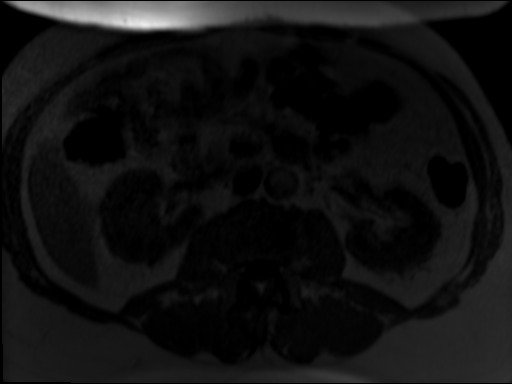
[im 52/78]
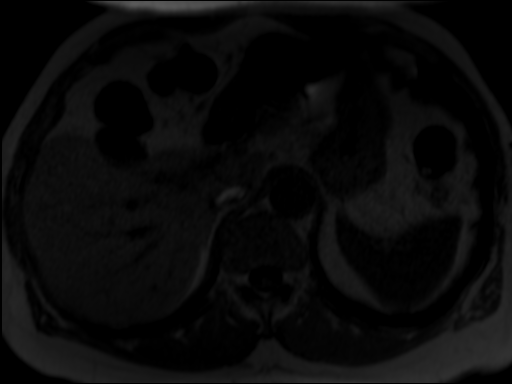
[im 78/78]
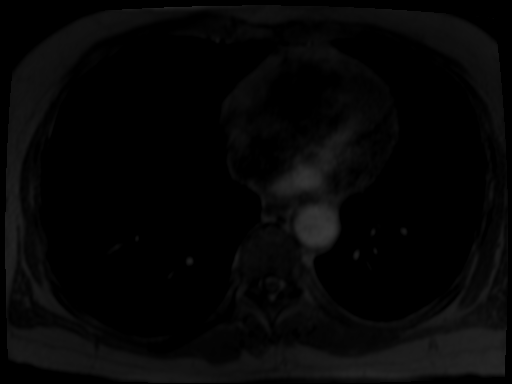

[Series 8: bSSFP · axial · 4.0mm · 0.68mm/px · z∈[-114,+124]mm · 3 of 61 slices shown (1 of 2)]
[im 1/61]
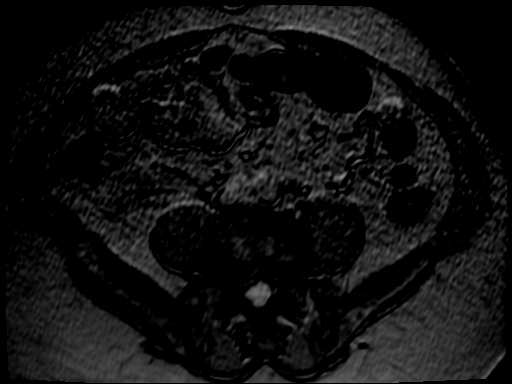
[im 31/61]
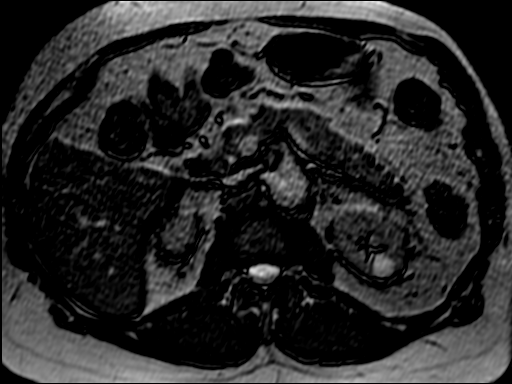
[im 61/61]
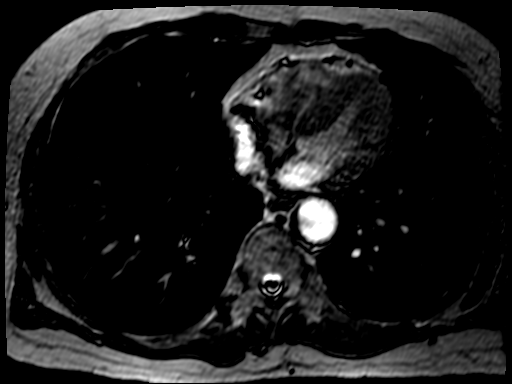

[Series 9: T2 fat-sat · axial · 6.0mm · 1.12mm/px · z∈[-61,+203]mm · 2 of 38 slices shown]
[im 1/38]
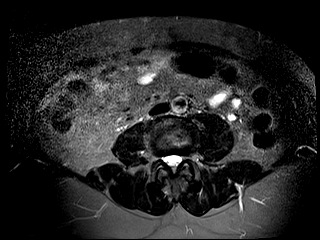
[im 38/38]
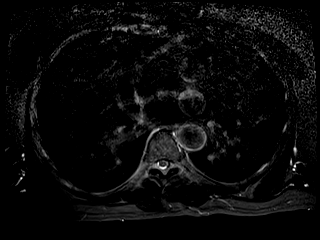

[Series 10: bSSFP · axial · 4.0mm · 0.68mm/px · 1 of 44 slices shown (2 of 2)]
[im 1/44]
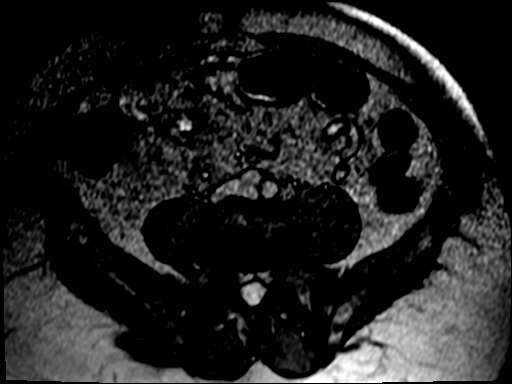

[Series 11: ep2d_diff_b50_500_800_p2_trig · axial · 6.0mm · 1.82mm/px · z∈[-40,+181]mm · 3 of 96 slices shown]
[im 1/96]
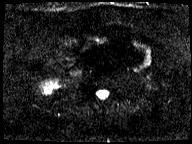
[im 48/96]
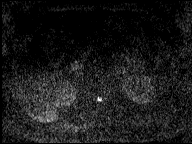
[im 96/96]
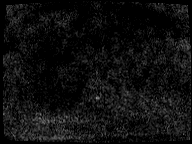

[Series 12: ep2d_[id number]_500_800_p2_trig_adc · axial · 6.0mm · 1.82mm/px · 1 of 32 slices shown]
[im 1/32]
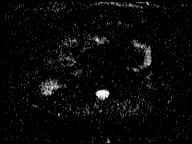

[Series 13: T1 dynamic · axial · non-contrast · 2.5mm · 0.74mm/px · z∈[-122,+134]mm · 3 of 100 slices shown]
[im 1/100]
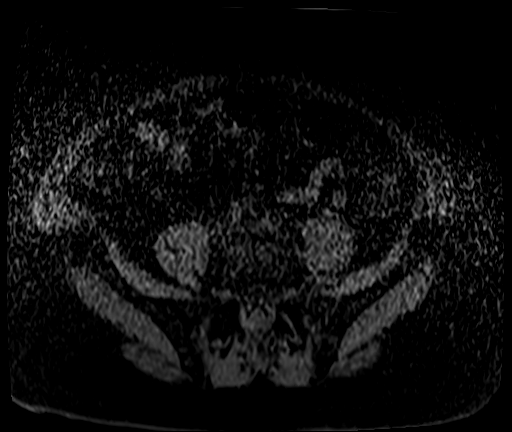
[im 50/100]
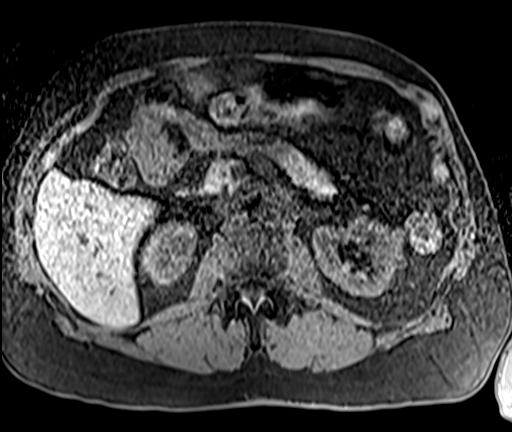
[im 100/100]
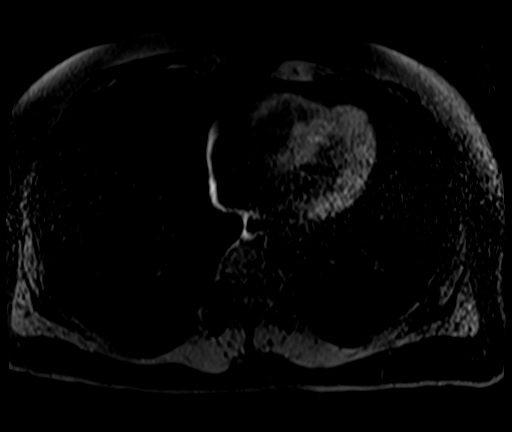

[Series 14: T1 dynamic post-contrast · axial · 2.5mm · 0.74mm/px · z∈[-122,+134]mm · 3 of 104 slices shown (1 of 2)]
[im 1/104]
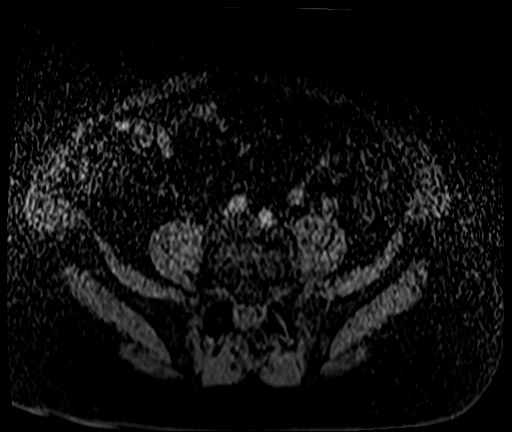
[im 52/104]
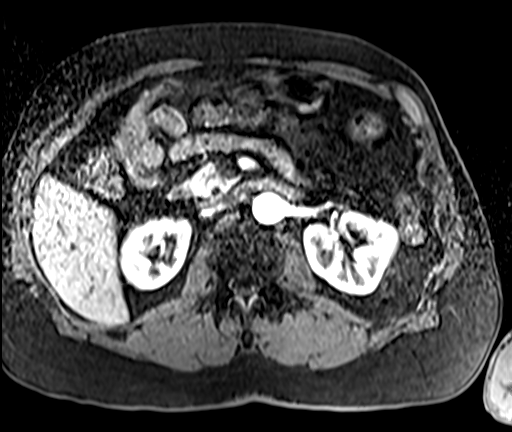
[im 104/104]
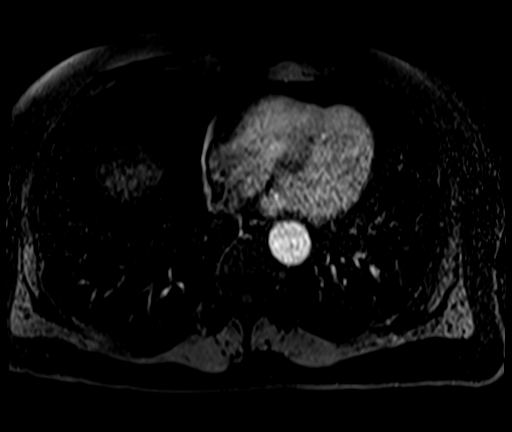

[Series 15: T1 dynamic post-contrast · axial · 2.5mm · 0.74mm/px · z∈[-122,+5]mm · 2 of 104 slices shown (2 of 2)]
[im 1/104]
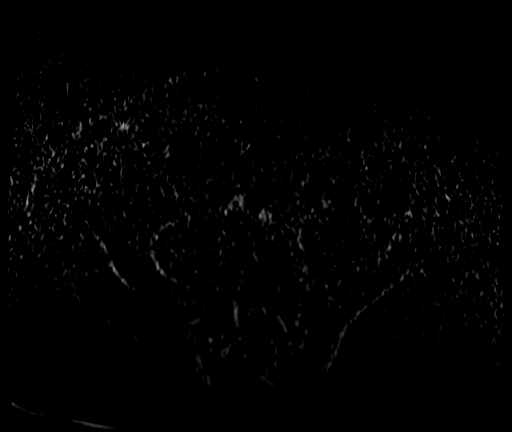
[im 52/104]
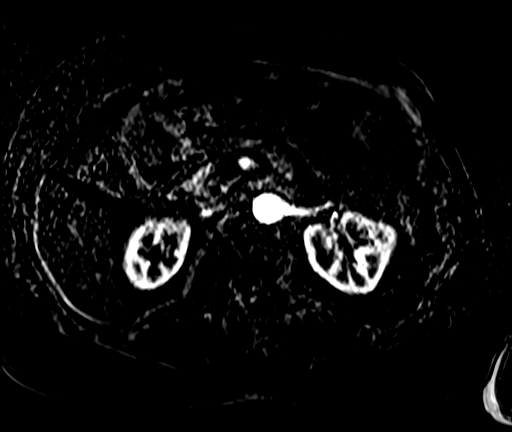

[26 of 48 positions shown; findings below may reference images not displayed]

FINDINGS: Despite efforts by the technologist and patient, motion artifact is
present on today's exam and could not be eliminated. This reduces
exam sensitivity and specificity.

Lower chest: Unremarkable

Hepatobiliary: Scattered small T2 hyperintense lesions in the liver
do not appear to enhance, compatible with small cysts. These are not
appreciably changed from prior. The gallbladder appears
unremarkable. No biliary dilatation.

Pancreas:  Unremarkable

Spleen:  Unremarkable

Adrenals/Urinary Tract:  Both adrenal glands appear normal.

The lesion of concern in the right mid kidney shown on the prior
exam currently has high precontrast T1 signal characteristics on
image 61 series 13, and intermediate T2 signal characteristics
similar to the renal parenchyma. Enhancement cannot be assessed due
to the small size the renal lesion relative to motion artifact and
misregistration. Lesion is measured at 0.7 by 0.8 cm on image 61
series 13. My sense is the lesion is stable in size from 02/09/2019,
where it measured 8 by 7 mm on image 62 of series 5 by my
measurements.

Bosniak category 2 benign cyst of the left kidney upper pole (this
lesion has known rim calcifications). Bosniak category 1 small cyst
of the left mid kidney on image 23 series 9.

Stomach/Bowel: Unremarkable

Vascular/Lymphatic: Atherosclerosis is present, including aortoiliac
atherosclerotic disease.

Other:  No supplemental non-categorized findings.

Musculoskeletal: Degenerative endplate findings at L4-5.
IMPRESSION: 1. Complex lesion in the right mid kidney measuring 7 by 8 mm has
high precontrast T1 signal characteristics probably representing
proteinaceous or hemorrhagic contents. The small size of the lesion
relative to the motion artifact and misregistration make it
indeterminate for enhancement on today's examination, and
accordingly a formal Bosniak classification cannot be assigned.
However, by my measurements this lesion is stable in size from
02/09/2019, which is somewhat reassuring. Surveillance imaging by
renal protocol CT or MRI in 12-18 months recommended.
2. Benign Bosniak category 2 cyst of the left kidney upper pole.
3.  Aortic Atherosclerosis ([id number]).
4. Scattered small hepatic cysts.

## 2023-03-28 ENCOUNTER — Ambulatory Visit: Payer: PPO | Admitting: Physical Therapy

## 2023-03-28 ENCOUNTER — Encounter: Payer: Self-pay | Admitting: Physical Therapy

## 2023-03-28 DIAGNOSIS — M6281 Muscle weakness (generalized): Secondary | ICD-10-CM | POA: Diagnosis not present

## 2023-03-28 DIAGNOSIS — R262 Difficulty in walking, not elsewhere classified: Secondary | ICD-10-CM | POA: Diagnosis not present

## 2023-03-28 DIAGNOSIS — M25562 Pain in left knee: Secondary | ICD-10-CM

## 2023-03-28 DIAGNOSIS — R600 Localized edema: Secondary | ICD-10-CM | POA: Diagnosis not present

## 2023-03-28 NOTE — Therapy (Addendum)
OUTPATIENT PHYSICAL THERAPY LOWER EXTREMITY TREATMENT   Patient Name: Nicholas Rasmussen MRN: [id number] DOB:11-May-1946, 77 y.o., male Today's Date: 03/28/2023  END OF SESSION:  PT End of Session - 03/28/23 1519     Visit Number 5    Number of Visits 24    Date for PT Re-Evaluation 06/10/23    PT Start Time 1510    PT Stop Time 1548    PT Time Calculation (min) 38 min    Activity Tolerance Patient tolerated treatment well;No increased pain;Patient limited by pain    Behavior During Therapy [facility] for tasks assessed/performed                Past Medical History:  Diagnosis Date   Allergic rhinitis 05/07/2014   Arthritis    Diabetes mellitus without complication (HCC)    Dizziness 01/02/2016   GERD (gastroesophageal reflux disease)    Headache 03/13/2015   Kidney lesion 07/22/2020   right, followed by urology   Left flank pain 01/02/2016   Low back pain 06/06/2015   Pain of left side of body 08/13/2015   Post herpetic neuralgia    Routine general medical examination at a health care facility 05/07/2014   Seizure disorder (HCC) 05/07/2014   Seizures (HCC)    Last seizure in the 70s.   Shingles    Stye 05/07/2014   Past Surgical History:  Procedure Laterality Date   COLONOSCOPY     KNEE SURGERY Left    left miniscus   TOTAL KNEE ARTHROPLASTY Left 02/28/2023   Procedure: TOTAL KNEE ARTHROPLASTY;  Surgeon: Tarry Kos, MD;  Location: [facility] OR;  Service: Orthopedics;  Laterality: Left;   UPPER GASTROINTESTINAL ENDOSCOPY     uvala surgery     Patient Active Problem List   Diagnosis Date Noted   Status post total left knee replacement 02/28/2023   Renal mass, right; Bosniak 4 complex cyst; 1 cm 09/09/2022   Vasomotor rhinitis 03/03/2022   Acute medial meniscus tear of left knee 02/04/2022   Primary osteoarthritis of left knee 02/04/2022   Aortic root dilatation (HCC) 12/18/2021   Slow transit constipation 12/08/2021   Left-sided chest wall pain 12/08/2021    Multiple pulmonary nodules 10/06/2021   Hyperlipidemia associated with type 2 diabetes mellitus (HCC) 08/19/2021   [name] 04/07/2021   Complex renal cyst 04/07/2021   Varicose veins of both lower extremities with pain 02/02/2019   Encounter for therapeutic drug level monitoring 09/19/2018   Type 2 diabetes mellitus with diabetic polyneuropathy, without long-term current use of insulin (HCC) 03/29/2018   Diabetic neuropathy (HCC) 03/29/2018   Chronic pain of left knee 03/23/2018   Coronary artery disease involving native coronary artery of native heart with angina pectoris (HCC) 08/03/2017   Post herpetic neuralgia 07/29/2016   Dizziness 01/02/2016   Chronic midline low back pain without sciatica 06/06/2015   Nonintractable episodic headache 03/13/2015   GERD (gastroesophageal reflux disease) 11/15/2014   Seizure disorder (HCC) 05/07/2014   Allergic rhinitis 05/07/2014    PCP: Anne Ng, NP  REFERRING PROVIDER: Tarry Kos, MD  REFERRING DIAG:  Diagnosis  M17.12 (ICD-10-CM) - Primary osteoarthritis of left knee    THERAPY DIAG:  Acute pain of left knee  Muscle weakness (generalized)  Difficulty in walking, not elsewhere classified  Localized edema  Rationale for Evaluation and Treatment: Rehabilitation  ONSET DATE: 02/28/23  SUBJECTIVE:   SUBJECTIVE STATEMENT: Pt reporting his home exercises are going well. Pt concerned about  swelling in his left knee.   PERTINENT HISTORY: Pt s/p left TKA on 02/28/23.PMH: diabetes, HTN, CAD, seizures, OA and medial meniscus tear of left knee   PAIN:  NPRS scale: 6-7/10 Pain location: left knee Pain description: shooting on and off  Aggravating factors: bending, sleeping, sit to stand Relieving factors: resting, taking pain meds, ice  PRECAUTIONS: None  WEIGHT BEARING RESTRICTIONS: No  FALLS:  Has patient fallen in last 6 months? No  LIVING ENVIRONMENT: Lives in:  House/apartment Stairs: 1 flight on stairs, said he goes up and down sideways  OCCUPATION:  retired -- enjoys dancing,   PLOF: Independent  PATIENT GOALS:  get back to dancing   Next MD visit: November 15 @ 10:15  OBJECTIVE:   DIAGNOSTIC FINDINGS: FINDINGS: Left knee arthroplasty in expected alignment. No periprosthetic lucency or fracture. Recent postsurgical change includes air and edema in the soft tissues and joint space. Vascular calcifications posteriorly peer   IMPRESSION: Left knee arthroplasty without immediate postoperative complication.  PATIENT SURVEYS:  03/18/23: FOTO intake: 40 (Goal 59 is 16 visits)  COGNITION: Overall cognitive status: WFL    SENSATION: WFL  EDEMA:  Circumferential: Left:   centimeters Rt: 44.5 centimeters   PALPATION: TTP around incision site and medial and lateral joint tenderness  LOWER EXTREMITY ROM:   ROM Right 03/15/23 Left 03/15/23 supine Left 03/18/2023 Left 03/25/23 Left Supine 03/28/23  Hip flexion       Hip extension       Hip abduction       Hip adduction       Hip internal rotation       Hip external rotation       Knee flexion  A: 70 P; 74 A:  92 AROM 102*, PROM 105* seated edge of mat table A: 106 P: 110  Knee extension  A: -4 P: 0 A: -3 AROM -3* supine heel prop  A: -4 P: -2  Ankle dorsiflexion       Ankle plantarflexion       Ankle inversion       Ankle eversion        (Blank rows = not tested)  LOWER EXTREMITY MMT:  MMT Right 03/15/23 Left 03/15/23  Hip flexion 5 4  Hip extension    Hip abduction    Hip adduction    Hip internal rotation    Hip external rotation    Knee flexion 5 4  Knee extension 5 3-  Ankle dorsiflexion    Ankle plantarflexion    Ankle inversion    Ankle eversion     (Blank rows = not tested)    FUNCTIONAL TESTS:  03/15/23: 5 time sit to stand: 27.5 seconds c UE support  GAIT:03/15/23 Distance walked: clinic distances Assistive device utilized: Environmental consultant - 2  wheeled Level of assistance: Modified independence Comments: antalgic gait with increased knee flexion and decreased heel strike  TODAY'S TREATMENT                                                                          DATE: 03/28/23:  TherEx:  Nustep: level 6 x 7 minutes LAQ: 4# 2 x 10 left LE Sit to stand: 2 x 10 c UE support as needed Forward lunges on 6 inch step c UE support, pushing forward and back x 1 minute Supine: heel slides using ball and strap for assistance holding 5 seconds at end range x 10  Supine SLR: 2 x 10 Left LE Leg Press: 100# bil LEs 2 x 10, Left LE: 37# 2 x 10  Manual:  PROM: left knee flexion/extension Modalities:  Vasopenumatic device: 34 deg, medium compression, x 10 minutes LE elevated    03/25/23  TherEx  Scifit all 4 extremities seat 16-->14 progressing x10 minutes full rotations  SAQ 2# x10 L LE Bridges with progressive knee flexion 3x5  Seated SLR raises + quad set x10 L LE  Knee flexion stretch holds 10x5 second holds from mat table progressive scooting further forward each rep  HS stretches 2x30 seconds  Encouraged regular icing/elevation at home    03/23/2023: Therapeutic Exercise: SciFit bike seat 16 level 1 with BLEs & BUEs for 5 min then BLEs only for 3 min Gastroc stretch on step heel depression 30 sec hold 2 reps Hamstring stretch long sit with strap 30 sec hold 2 reps Quad stretch supine hooklying with strap knee flexion 30 sec hold 2 reps PT added above 3 stretches to HEP with HO, demo & verbal cues. Pt return demo & verbalized understanding.  Quad set LLE supine 5 sec hold with towel roll under ankle 10 reps Heel slide with lower leg on 18" ball with strap assist knee flexion stretch 5 sec and leg press ext 5 sec for 10 reps  Manual Therapy: PROM with  overpressure flexion & ext  Self-Care: PT verbal cues on positioning in bed using pillows to "tent" sheets off feet.   PT recommended elevation with BLEs higher than heart with muscle activity like alphabet with ankle for >/= 15 min >/= 2x/day. Pt verbalized understanding of above.   Therapeutic Activities: Double leg Press full extension and stretch into flexion 20 x with 81# and slow eccentrics;  LLE only 31# 10 reps PT recommended lateral weight shifts onto LLE upon arising for 5 reps prior to walking.  Pt reported less knee discomfort.   Vasopneumatic: 36 deg, medium compression, left knee elevated x 10 minutes with ankle alphabet 2 reps   03/18/2023 Recumbent bike Seat 10 AAROM flexion emphasis for 5 minutes Quadriceps sets with heel prop 2 sets of 10 for 5 seconds Seated knee flexion and extension with self-overpressure (per HEP) 10 second hold with flexion and 5 second hold with extension Verbally reviewed other exercises  Functional Activities: Double leg Press full extension and stretch into flexion 20 x with 75# and slow eccentrics  Vaso left knee 10 minutes High pressure 34*   03/15/23:  Therex:  HEP instruction/performance c cues for techniques, handout provided.  Trial set performed of each for comprehension and symptom assessment.  See below for exercise list Modalities:  Vasopneumatic: 36 deg, medium compression,  left knee elevated x 10 minutes    PATIENT EDUCATION:  Education details: HEP, POC Person educated: Patient Education method: Explanation, Demonstration, Verbal cues, and Handouts Education comprehension: verbalized understanding, returned demonstration, and verbal cues required  HOME EXERCISE PROGRAM: Access Code: [email or web address] URL: [email or web address] Date: 03/23/2023 Prepared by: Vladimir Faster  Exercises - Seated Knee Flexion Extension AAROM with Overpressure  - 1 x daily - 7 x weekly - 3 sets - 10 reps - Seated Long Arc Quad  -  3-5 x daily - 7 x weekly - 2 sets - 10 reps - 3 seconds hold - Seated Heel Slide  - 3-4 x daily - 7 x weekly - 2 sets - 10 reps - 5 seconds hold - Supine Heel Slide with Strap  - 3-4 x daily - 7 x weekly - 2 sets - 10 reps - 5 seconds hold - Long Sitting Quad Set with Towel Roll Under Heel  - 3-4 x daily - 7 x weekly - 2 sets - 10 reps - Sit to Stand  - 3-4 x daily - 7 x weekly - 10 reps - Hooklying Hamstring Stretch with Strap  - 2 x daily - 7 x weekly - 3 sets - 10 reps - Gastroc Stretch on Step  - 2-3 x daily - 7 x weekly - 1 sets - 2-3 reps - 20-30 seconds hold - Seated Table Hamstring Stretch  - 2-3 x daily - 7 x weekly - 1 sets - 2-3 reps - 20-30 seconds hold - Supine Quadriceps Stretch with Strap on Table  - 2-3 x daily - 7 x weekly - 1 sets - 2-3 reps - 20-30 seconds hold  ASSESSMENT:  CLINICAL IMPRESSION:  Pt arriving today reporting pain in his left knee of 5-6/10. Pt amb with a rolling walker. Pt tolerating exercises with no reports of increased pain. Treatment focusing on left quad strengthening as well as improving pt's functional ROM. Pt instructed to elevate while icing at home to help with swelling. Recommending continued skilled PT interventions per POC.   OBJECTIVE IMPAIRMENTS: decreased balance, decreased mobility, difficulty walking, decreased ROM, decreased strength, increased edema, and pain.   ACTIVITY LIMITATIONS: bending, standing, squatting, sleeping, stairs, and transfers  PARTICIPATION LIMITATIONS: driving and community activity  PERSONAL FACTORS: 3+ comorbidities: see pertinent history above  are also affecting patient's functional outcome.   REHAB POTENTIAL: Good  CLINICAL DECISION MAKING: Stable/uncomplicated  EVALUATION COMPLEXITY: Low   GOALS: Goals reviewed with patient? Yes  SHORT TERM GOALS: (target date for Short term goals are 3 weeks 04/08/23)   1.  Patient will demonstrate independent use of home exercise program to maintain progress from in  clinic treatments.  Goal status: Ongoing 03/23/2023  2. Pt will improve his 5 time sit to stand to </= 15 sec with UE support.  Goal status: Ongoing 03/23/2023  LONG TERM GOALS: (target dates for all long term goals are 12 weeks 06/10/23 )   1. Patient will demonstrate/report pain at worst less than or equal to 2/10 to facilitate minimal limitation in daily activity secondary to pain symptoms.  Goal status: Ongoing 03/23/2023   2. Patient will demonstrate independent use of home exercise program to facilitate ability to maintain/progress functional gains from skilled physical therapy services.  Goal status: Ongoing 03/23/2023   3. Patient will demonstrate FOTO outcome > or = 10 % from baseline to indicate reduced disability due to condition.  Goal status: Ongoing 03/23/2023   4.  Patient will demonstrate left LE  MMT 5/5 throughout to faciltiate usual transfers, stairs, squatting at [facility] for daily life.   Goal status: Ongoing 03/23/2023   5.  Patient will demonstrate stair navigation using reciprocal gait pattern using single hand rail.  Goal status: Ongoing 03/23/2023   6.  Pt will be able to demonstrate ball room maneuvers with pain </= 2/10.  Goal status: Ongoing 03/23/2023  7. Pt will improve his left knee flexion to >/= 115 degrees to improve functional mobility.   Goal Status: Ongoing 03/23/2023      PLAN:  PT FREQUENCY: 1-2x/week  PT DURATION: 10 weeks  PLANNED INTERVENTIONS: Can include 95621- PT Re-evaluation, 97110-Therapeutic exercises, 97530- Therapeutic activity, 97112- Neuromuscular re-education, 97535- Self Care, 97140- Manual therapy, [phone]- Gait training, [phone]- Orthotic Fit/training, [phone]- Canalith repositioning, [id number]- Aquatic Therapy, 97014- Electrical stimulation (unattended), [id number]- Electrical stimulation (manual), [id number]- Vasopneumatic device, [id number]- Ultrasound, [id number]- Traction (mechanical), [id number]- Ionotophoresis 4mg /ml Dexamethasone, Patient/Family  education, Balance training, Stair training, Taping, Dry Needling, Joint mobilization, Joint manipulation, Spinal manipulation, Spinal mobilization, Scar mobilization, Vestibular training, Visual/preceptual remediation/compensation, DME instructions, Cryotherapy, and Moist heat.  All performed as medically necessary.  All included unless contraindicated  PLAN FOR NEXT SESSION: Manual therapy and exercises including functional for ROM (flexion emphasis) & quadriceps strengthening, vaso as needed   Narda Amber, PT, MPT 03/28/23 3:34 PM   03/28/23 3:34 PM

## 2023-03-30 ENCOUNTER — Ambulatory Visit: Payer: PPO | Admitting: Physical Therapy

## 2023-03-30 ENCOUNTER — Other Ambulatory Visit: Payer: Self-pay | Admitting: Physician Assistant

## 2023-03-30 ENCOUNTER — Telehealth: Payer: Self-pay | Admitting: Physician Assistant

## 2023-03-30 ENCOUNTER — Encounter: Payer: Self-pay | Admitting: Physical Therapy

## 2023-03-30 DIAGNOSIS — R262 Difficulty in walking, not elsewhere classified: Secondary | ICD-10-CM | POA: Diagnosis not present

## 2023-03-30 DIAGNOSIS — M25562 Pain in left knee: Secondary | ICD-10-CM | POA: Diagnosis not present

## 2023-03-30 DIAGNOSIS — M6281 Muscle weakness (generalized): Secondary | ICD-10-CM

## 2023-03-30 DIAGNOSIS — R600 Localized edema: Secondary | ICD-10-CM | POA: Diagnosis not present

## 2023-03-30 MED ORDER — METHOCARBAMOL 750 MG PO TABS: 750.0000 mg | ORAL_TABLET | Freq: Two times a day (BID) | ORAL | 2 refills | Status: DC | PRN

## 2023-03-30 MED ORDER — HYDROCODONE-ACETAMINOPHEN 5-325 MG PO TABS: 1.0000 | ORAL_TABLET | Freq: Three times a day (TID) | ORAL | 0 refills | Status: DC | PRN

## 2023-03-30 NOTE — Telephone Encounter (Signed)
sent 

## 2023-03-30 NOTE — Telephone Encounter (Signed)
Notified patient.

## 2023-03-30 NOTE — Therapy (Signed)
OUTPATIENT PHYSICAL THERAPY LOWER EXTREMITY TREATMENT   Patient Name: Nicholas Rasmussen MRN: [id number] DOB:08-04-1945, 77 y.o., male Today's Date: 03/30/2023  END OF SESSION:  PT End of Session - 03/30/23 1108     Visit Number 6    Number of Visits 24    Date for PT Re-Evaluation 06/10/23    PT Start Time 1102    PT Stop Time 1155    PT Time Calculation (min) 53 min    Activity Tolerance Patient tolerated treatment well;No increased pain;Patient limited by pain    Behavior During Therapy [facility] for tasks assessed/performed                 Past Medical History:  Diagnosis Date   Allergic rhinitis 05/07/2014   Arthritis    Diabetes mellitus without complication (HCC)    Dizziness 01/02/2016   GERD (gastroesophageal reflux disease)    Headache 03/13/2015   Kidney lesion 07/22/2020   right, followed by urology   Left flank pain 01/02/2016   Low back pain 06/06/2015   Pain of left side of body 08/13/2015   Post herpetic neuralgia    Routine general medical examination at a health care facility 05/07/2014   Seizure disorder (HCC) 05/07/2014   Seizures (HCC)    Last seizure in the 70s.   Shingles    Stye 05/07/2014   Past Surgical History:  Procedure Laterality Date   COLONOSCOPY     KNEE SURGERY Left    left miniscus   TOTAL KNEE ARTHROPLASTY Left 02/28/2023   Procedure: TOTAL KNEE ARTHROPLASTY;  Surgeon: Tarry Kos, MD;  Location: [facility] OR;  Service: Orthopedics;  Laterality: Left;   UPPER GASTROINTESTINAL ENDOSCOPY     uvala surgery     Patient Active Problem List   Diagnosis Date Noted   Status post total left knee replacement 02/28/2023   Renal mass, right; Bosniak 4 complex cyst; 1 cm 09/09/2022   Vasomotor rhinitis 03/03/2022   Acute medial meniscus tear of left knee 02/04/2022   Primary osteoarthritis of left knee 02/04/2022   Aortic root dilatation (HCC) 12/18/2021   Slow transit constipation 12/08/2021   Left-sided chest wall pain 12/08/2021    Multiple pulmonary nodules 10/06/2021   Hyperlipidemia associated with type 2 diabetes mellitus (HCC) 08/19/2021   [name] 04/07/2021   Complex renal cyst 04/07/2021   Varicose veins of both lower extremities with pain 02/02/2019   Encounter for therapeutic drug level monitoring 09/19/2018   Type 2 diabetes mellitus with diabetic polyneuropathy, without long-term current use of insulin (HCC) 03/29/2018   Diabetic neuropathy (HCC) 03/29/2018   Chronic pain of left knee 03/23/2018   Coronary artery disease involving native coronary artery of native heart with angina pectoris (HCC) 08/03/2017   Post herpetic neuralgia 07/29/2016   Dizziness 01/02/2016   Chronic midline low back pain without sciatica 06/06/2015   Nonintractable episodic headache 03/13/2015   GERD (gastroesophageal reflux disease) 11/15/2014   Seizure disorder (HCC) 05/07/2014   Allergic rhinitis 05/07/2014    PCP: Anne Ng, NP  REFERRING PROVIDER: Tarry Kos, MD  REFERRING DIAG:  Diagnosis  M17.12 (ICD-10-CM) - Primary osteoarthritis of left knee    THERAPY DIAG:  Acute pain of left knee  Muscle weakness (generalized)  Difficulty in walking, not elsewhere classified  Localized edema  Rationale for Evaluation and Treatment: Rehabilitation  ONSET DATE: 02/28/23  SUBJECTIVE:   SUBJECTIVE STATEMENT: The pillows to lift sheets off feet helps.  But  if he does not take a pain pill at bedtime, then he only sleeps 2 hours before pain wakes him up.    PERTINENT HISTORY: Pt s/p left TKA on 02/28/23.PMH: diabetes, HTN, CAD, seizures, OA and medial meniscus tear of left knee   PAIN:  NPRS scale: today 5/10 and over the last week 2/10 -  8/10 Pain location: left knee Pain description: shooting on and off  Aggravating factors: bending, sleeping, sit to stand Relieving factors: resting, taking pain meds, ice  PRECAUTIONS: None  WEIGHT BEARING RESTRICTIONS:  No  FALLS:  Has patient fallen in last 6 months? No  LIVING ENVIRONMENT: Lives in: House/apartment Stairs: 1 flight on stairs, said he goes up and down sideways  OCCUPATION:  retired -- enjoys dancing,   PLOF: Independent  PATIENT GOALS:  get back to dancing   Next MD visit: November 15 @ 10:15  OBJECTIVE:   DIAGNOSTIC FINDINGS: FINDINGS: Left knee arthroplasty in expected alignment. No periprosthetic lucency or fracture. Recent postsurgical change includes air and edema in the soft tissues and joint space. Vascular calcifications posteriorly peer   IMPRESSION: Left knee arthroplasty without immediate postoperative complication.  PATIENT SURVEYS:  03/18/23: FOTO intake: 40 (Goal 59 is 16 visits)  COGNITION: Overall cognitive status: WFL    SENSATION: WFL  EDEMA:  Circumferential: Left:   centimeters Rt: 44.5 centimeters   PALPATION: TTP around incision site and medial and lateral joint tenderness  LOWER EXTREMITY ROM:   ROM Right 03/15/23 Left 03/15/23 supine Left 03/18/2023 Left 03/25/23 Left Supine 03/28/23  Hip flexion       Hip extension       Hip abduction       Hip adduction       Hip internal rotation       Hip external rotation       Knee flexion  A: 70 P; 74 A:  92 AROM 102*, PROM 105* seated edge of mat table A: 106 P: 110  Knee extension  A: -4 P: 0 A: -3 AROM -3* supine heel prop  A: -4 P: -2  Ankle dorsiflexion       Ankle plantarflexion       Ankle inversion       Ankle eversion        (Blank rows = not tested)  LOWER EXTREMITY MMT:  MMT Right 03/15/23 Left 03/15/23  Hip flexion 5 4  Hip extension    Hip abduction    Hip adduction    Hip internal rotation    Hip external rotation    Knee flexion 5 4  Knee extension 5 3-  Ankle dorsiflexion    Ankle plantarflexion    Ankle inversion    Ankle eversion     (Blank rows = not tested)    FUNCTIONAL TESTS:  03/15/23: 5 time sit to stand: 27.5 seconds c UE  support  GAIT:03/15/23 Distance walked: clinic distances Assistive device utilized: Environmental consultant - 2 wheeled Level of assistance: Modified independence Comments: antalgic gait with increased knee flexion and decreased heel strike  TODAY'S TREATMENT                                                                          DATE: 03/30/2023: Therapeutic Exercise: SciFit bike seat 16 LEs only level 1 for 8 min. Gastroc stretch step heel depression 20 sec hold 2 reps Knee flexion stretch with LLE on 2nd step forward weight shift 10 sec hold 10 reps Hamstring stretch straight leg on 2nd step trunk flexion 20 sec hold 2 reps LAQ 5# 10 reps 2 sets Quad stretch hoooklying supine 20 sec hold 2 reps Bridge matching knee flexion BLEs 3 reps 4 sets moving feet closer with each set   Therapeutic Activities: PT demo & verbal cues on gait with cane. Pt amb 200' with cane with supervision and no signs of knee instability. Stairs flight 11 steps with 2 rails: descend step-to LLE eccentric and ascend alternating pattern.  PT demo & verbal cues prior to activity.    Modalities:  Vasopenumatic device: 34 deg, medium compression, x 10 minutes LE elevated  03/28/23:  TherEx:  Nustep: level 6 x 7 minutes LAQ: 4# 2 x 10 left LE Sit to stand: 2 x 10 c UE support as needed Forward lunges on 6 inch step c UE support, pushing forward and back x 1 minute Supine: heel slides using ball and strap for assistance holding 5 seconds at end range x 10  Supine SLR: 2 x 10 Left LE Leg Press: 100# bil LEs 2 x 10, Left LE: 37# 2 x 10  Manual:  PROM: left knee flexion/extension Modalities:  Vasopenumatic device: 34 deg, medium compression, x 10 minutes LE elevated    03/25/23  TherEx  Scifit all 4 extremities seat 16-->14 progressing x10 minutes full  rotations  SAQ 2# x10 L LE Bridges with progressive knee flexion 3x5  Seated SLR raises + quad set x10 L LE  Knee flexion stretch holds 10x5 second holds from mat table progressive scooting further forward each rep  HS stretches 2x30 seconds  Encouraged regular icing/elevation at home    PATIENT EDUCATION:  Education details: HEP, POC Person educated: Patient Education method: Programmer, multimedia, Facilities manager, Verbal cues, and Handouts Education comprehension: verbalized understanding, returned demonstration, and verbal cues required  HOME EXERCISE PROGRAM: Access Code: [email or web address] URL: [email or web address] Date: 03/23/2023 Prepared by: Vladimir Faster  Exercises - Seated Knee Flexion Extension AAROM with Overpressure  - 1 x daily - 7 x weekly - 3 sets - 10 reps - Seated Long Arc Quad  - 3-5 x daily - 7 x weekly - 2 sets - 10 reps - 3 seconds hold - Seated Heel Slide  - 3-4 x daily - 7 x weekly - 2 sets - 10 reps - 5 seconds hold - Supine Heel Slide with Strap  - 3-4 x daily - 7 x weekly - 2 sets - 10 reps - 5 seconds hold - Long Sitting Quad Set with Towel Roll Under Heel  - 3-4 x daily - 7 x weekly - 2 sets - 10 reps - Sit to Stand  - 3-4 x daily - 7 x weekly - 10 reps - Hooklying Hamstring Stretch with Strap  - 2 x daily - 7 x weekly - 3  sets - 10 reps - Gastroc Stretch on Step  - 2-3 x daily - 7 x weekly - 1 sets - 2-3 reps - 20-30 seconds hold - Seated Table Hamstring Stretch  - 2-3 x daily - 7 x weekly - 1 sets - 2-3 reps - 20-30 seconds hold - Supine Quadriceps Stretch with Strap on Table  - 2-3 x daily - 7 x weekly - 1 sets - 2-3 reps - 20-30 seconds hold  ASSESSMENT:  CLINICAL IMPRESSION: Patient appears safe to use cane for gait progressing distance as tolerated.  PT instructed in stairs utilizing left knee and he appears to understand.   Recommending continued skilled PT interventions per POC.   OBJECTIVE IMPAIRMENTS: decreased balance, decreased mobility,  difficulty walking, decreased ROM, decreased strength, increased edema, and pain.   ACTIVITY LIMITATIONS: bending, standing, squatting, sleeping, stairs, and transfers  PARTICIPATION LIMITATIONS: driving and community activity  PERSONAL FACTORS: 3+ comorbidities: see pertinent history above  are also affecting patient's functional outcome.   REHAB POTENTIAL: Good  CLINICAL DECISION MAKING: Stable/uncomplicated  EVALUATION COMPLEXITY: Low   GOALS: Goals reviewed with patient? Yes  SHORT TERM GOALS: (target date for Short term goals are 3 weeks 04/08/23)   1.  Patient will demonstrate independent use of home exercise program to maintain progress from in clinic treatments.  Goal status: Ongoing 03/23/2023  2. Pt will improve his 5 time sit to stand to </= 15 sec with UE support.  Goal status: Ongoing 03/23/2023  LONG TERM GOALS: (target dates for all long term goals are 12 weeks 06/10/23 )   1. Patient will demonstrate/report pain at worst less than or equal to 2/10 to facilitate minimal limitation in daily activity secondary to pain symptoms.  Goal status: Ongoing 03/23/2023   2. Patient will demonstrate independent use of home exercise program to facilitate ability to maintain/progress functional gains from skilled physical therapy services.  Goal status: Ongoing 03/23/2023   3. Patient will demonstrate FOTO outcome > or = 10 % from baseline to indicate reduced disability due to condition.  Goal status: Ongoing 03/23/2023   4.  Patient will demonstrate left LE MMT 5/5 throughout to faciltiate usual transfers, stairs, squatting at [facility] for daily life.   Goal status: Ongoing 03/23/2023   5.  Patient will demonstrate stair navigation using reciprocal gait pattern using single hand rail.  Goal status: Ongoing 03/23/2023   6.  Pt will be able to demonstrate ball room maneuvers with pain </= 2/10.  Goal status: Ongoing 03/23/2023  7. Pt will improve his left knee flexion to  >/= 115 degrees to improve functional mobility.   Goal Status: Ongoing 03/23/2023      PLAN:  PT FREQUENCY: 1-2x/week  PT DURATION: 10 weeks  PLANNED INTERVENTIONS: Can include 40981- PT Re-evaluation, 97110-Therapeutic exercises, 97530- Therapeutic activity, 97112- Neuromuscular re-education, 97535- Self Care, 97140- Manual therapy, [phone]- Gait training, [phone]- Orthotic Fit/training, [phone]- Canalith repositioning, [id number]- Aquatic Therapy, 97014- Electrical stimulation (unattended), [id number]- Electrical stimulation (manual), [id number]- Vasopneumatic device, [id number]- Ultrasound, [id number]- Traction (mechanical), [id number]- Ionotophoresis 4mg /ml Dexamethasone, Patient/Family education, Balance training, Stair training, Taping, Dry Needling, Joint mobilization, Joint manipulation, Spinal manipulation, Spinal mobilization, Scar mobilization, Vestibular training, Visual/preceptual remediation/compensation, DME instructions, Cryotherapy, and Moist heat.  All performed as medically necessary.  All included unless contraindicated  PLAN FOR NEXT SESSION: check STGs next session. Continue to progress cane use and stair neg.   Manual therapy and exercises including functional for ROM (flexion emphasis) & quadriceps strengthening, vaso as needed  Vladimir Faster, PT, DPT 03/30/2023, 12:07 PM

## 2023-03-30 NOTE — Telephone Encounter (Signed)
Pt came in requesting a refill of hydrocodone and methocarbamol. Please send to [facility] [address]. Pt phone number is [phone].

## 2023-04-01 ENCOUNTER — Encounter: Payer: Self-pay | Admitting: Physical Therapy

## 2023-04-01 ENCOUNTER — Ambulatory Visit: Payer: PPO | Admitting: Physical Therapy

## 2023-04-01 DIAGNOSIS — M25562 Pain in left knee: Secondary | ICD-10-CM

## 2023-04-01 DIAGNOSIS — R262 Difficulty in walking, not elsewhere classified: Secondary | ICD-10-CM | POA: Diagnosis not present

## 2023-04-01 DIAGNOSIS — M6281 Muscle weakness (generalized): Secondary | ICD-10-CM | POA: Diagnosis not present

## 2023-04-01 DIAGNOSIS — R600 Localized edema: Secondary | ICD-10-CM

## 2023-04-01 NOTE — Therapy (Signed)
OUTPATIENT PHYSICAL THERAPY LOWER EXTREMITY TREATMENT   Patient Name: Nicholas Rasmussen MRN: [id number] DOB:04-13-1946, 77 y.o., male Today's Date: 04/01/2023  END OF SESSION:  PT End of Session - 04/01/23 1302     Visit Number 7    Number of Visits 24    Date for PT Re-Evaluation 06/10/23    PT Start Time 1303    PT Stop Time 1342    PT Time Calculation (min) 39 min    Activity Tolerance Patient tolerated treatment well;No increased pain    Behavior During Therapy [facility] for tasks assessed/performed                  Past Medical History:  Diagnosis Date   Allergic rhinitis 05/07/2014   Arthritis    Diabetes mellitus without complication (HCC)    Dizziness 01/02/2016   GERD (gastroesophageal reflux disease)    Headache 03/13/2015   Kidney lesion 07/22/2020   right, followed by urology   Left flank pain 01/02/2016   Low back pain 06/06/2015   Pain of left side of body 08/13/2015   Post herpetic neuralgia    Routine general medical examination at a health care facility 05/07/2014   Seizure disorder (HCC) 05/07/2014   Seizures (HCC)    Last seizure in the 70s.   Shingles    Stye 05/07/2014   Past Surgical History:  Procedure Laterality Date   COLONOSCOPY     KNEE SURGERY Left    left miniscus   TOTAL KNEE ARTHROPLASTY Left 02/28/2023   Procedure: TOTAL KNEE ARTHROPLASTY;  Surgeon: Tarry Kos, MD;  Location: [facility] OR;  Service: Orthopedics;  Laterality: Left;   UPPER GASTROINTESTINAL ENDOSCOPY     uvala surgery     Patient Active Problem List   Diagnosis Date Noted   Status post total left knee replacement 02/28/2023   Renal mass, right; Bosniak 4 complex cyst; 1 cm 09/09/2022   Vasomotor rhinitis 03/03/2022   Acute medial meniscus tear of left knee 02/04/2022   Primary osteoarthritis of left knee 02/04/2022   Aortic root dilatation (HCC) 12/18/2021   Slow transit constipation 12/08/2021   Left-sided chest wall pain 12/08/2021   Multiple pulmonary nodules  10/06/2021   Hyperlipidemia associated with type 2 diabetes mellitus (HCC) 08/19/2021   [name] 04/07/2021   Complex renal cyst 04/07/2021   Varicose veins of both lower extremities with pain 02/02/2019   Encounter for therapeutic drug level monitoring 09/19/2018   Type 2 diabetes mellitus with diabetic polyneuropathy, without long-term current use of insulin (HCC) 03/29/2018   Diabetic neuropathy (HCC) 03/29/2018   Chronic pain of left knee 03/23/2018   Coronary artery disease involving native coronary artery of native heart with angina pectoris (HCC) 08/03/2017   Post herpetic neuralgia 07/29/2016   Dizziness 01/02/2016   Chronic midline low back pain without sciatica 06/06/2015   Nonintractable episodic headache 03/13/2015   GERD (gastroesophageal reflux disease) 11/15/2014   Seizure disorder (HCC) 05/07/2014   Allergic rhinitis 05/07/2014    PCP: Anne Ng, NP  REFERRING PROVIDER: Tarry Kos, MD  REFERRING DIAG:  Diagnosis  M17.12 (ICD-10-CM) - Primary osteoarthritis of left knee    THERAPY DIAG:  Acute pain of left knee  Muscle weakness (generalized)  Difficulty in walking, not elsewhere classified  Localized edema  Rationale for Evaluation and Treatment: Rehabilitation  ONSET DATE: 02/28/23  SUBJECTIVE:   SUBJECTIVE STATEMENT:   Doing OK, took pain meds before I came. It still  has its days   PERTINENT HISTORY: Pt s/p left TKA on 02/28/23.PMH: diabetes, HTN, CAD, seizures, OA and medial meniscus tear of left knee   PAIN:  NPRS scale:  3/10 Pain location: left knee Pain description: shooting on and off  Aggravating factors: bending, sleeping, sit to stand Relieving factors: resting, taking pain meds, ice  PRECAUTIONS: None  WEIGHT BEARING RESTRICTIONS: No  FALLS:  Has patient fallen in last 6 months? No  LIVING ENVIRONMENT: Lives in: House/apartment Stairs: 1 flight on stairs, said he goes up  and down sideways  OCCUPATION:  retired -- enjoys dancing,   PLOF: Independent  PATIENT GOALS:  get back to dancing   Next MD visit: November 15 @ 10:15  OBJECTIVE:   DIAGNOSTIC FINDINGS: FINDINGS: Left knee arthroplasty in expected alignment. No periprosthetic lucency or fracture. Recent postsurgical change includes air and edema in the soft tissues and joint space. Vascular calcifications posteriorly peer   IMPRESSION: Left knee arthroplasty without immediate postoperative complication.  PATIENT SURVEYS:  03/18/23: FOTO intake: 40 (Goal 59 is 16 visits)  COGNITION: Overall cognitive status: WFL    SENSATION: WFL  EDEMA:  Circumferential: Left:   centimeters Rt: 44.5 centimeters   PALPATION: TTP around incision site and medial and lateral joint tenderness  LOWER EXTREMITY ROM:   ROM Right 03/15/23 Left 03/15/23 supine Left 03/18/2023 Left 03/25/23 Left Supine 03/28/23 Left supine 04/01/23  Hip flexion        Hip extension        Hip abduction        Hip adduction        Hip internal rotation        Hip external rotation        Knee flexion  A: 70 P; 74 A:  92 AROM 102*, PROM 105* seated edge of mat table A: 106 P: 110 AROM 112*  Knee extension  A: -4 P: 0 A: -3 AROM -3* supine heel prop  A: -4 P: -2 AROM -2*  Ankle dorsiflexion        Ankle plantarflexion        Ankle inversion        Ankle eversion         (Blank rows = not tested)  LOWER EXTREMITY MMT:  MMT Right 03/15/23 Left 03/15/23  Hip flexion 5 4  Hip extension    Hip abduction    Hip adduction    Hip internal rotation    Hip external rotation    Knee flexion 5 4  Knee extension 5 3-  Ankle dorsiflexion    Ankle plantarflexion    Ankle inversion    Ankle eversion     (Blank rows = not tested)    FUNCTIONAL TESTS:  03/15/23: 5 time sit to stand: 27.5 seconds c UE support  GAIT:03/15/23 Distance walked: clinic distances Assistive device utilized: Environmental consultant - 2 wheeled Level  of assistance: Modified independence Comments: antalgic gait with increased knee flexion and decreased heel strike  TODAY'S TREATMENT                                                                          DATE:  04/01/23   TherEx  SciFit bike seat 16--> 13   x10 minutes full rotations for w/u and ROM Adjusted height of cane- had it too low upon entry to clinic  Gait training with cane at new height, cues for heel toe pattern and equal stance times/step lengths  Forward step ups 6 inch box x12 L LE Tandem stance 3x30 seconds B solid surface Lateral step ups 6 inch box x12 L LE cues to shift over LE prior to step up  Narrow BOS blue foam pad 3x30 seconds 4 inch box forward step downs x10  LLE  Tandem gait in // bars x3 laps forwards only       03/30/2023: Therapeutic Exercise: SciFit bike seat 16 LEs only level 1 for 8 min. Gastroc stretch step heel depression 20 sec hold 2 reps Knee flexion stretch with LLE on 2nd step forward weight shift 10 sec hold 10 reps Hamstring stretch straight leg on 2nd step trunk flexion 20 sec hold 2 reps LAQ 5# 10 reps 2 sets Quad stretch hoooklying supine 20 sec hold 2 reps Bridge matching knee flexion BLEs 3 reps 4 sets moving feet closer with each set   Therapeutic Activities: PT demo & verbal cues on gait with cane. Pt amb 200' with cane with supervision and no signs of knee instability. Stairs flight 11 steps with 2 rails: descend step-to LLE eccentric and ascend alternating pattern.  PT demo & verbal cues prior to activity.    Modalities:  Vasopenumatic device: 34 deg, medium compression, x 10 minutes LE elevated  03/28/23:  TherEx:  Nustep: level 6 x 7 minutes LAQ: 4# 2 x 10 left LE Sit to stand: 2 x 10 c UE support as needed Forward lunges on 6 inch step c UE support,  pushing forward and back x 1 minute Supine: heel slides using ball and strap for assistance holding 5 seconds at end range x 10  Supine SLR: 2 x 10 Left LE Leg Press: 100# bil LEs 2 x 10, Left LE: 37# 2 x 10  Manual:  PROM: left knee flexion/extension Modalities:  Vasopenumatic device: 34 deg, medium compression, x 10 minutes LE elevated    03/25/23  TherEx  Scifit all 4 extremities seat 16-->14 progressing x10 minutes full rotations  SAQ 2# x10 L LE Bridges with progressive knee flexion 3x5  Seated SLR raises + quad set x10 L LE  Knee flexion stretch holds 10x5 second holds from mat table progressive scooting further forward each rep  HS stretches 2x30 seconds  Encouraged regular icing/elevation at home    PATIENT EDUCATION:  Education details: HEP, POC Person educated: Patient Education method: Programmer, multimedia, Facilities manager, Verbal cues, and Handouts Education comprehension: verbalized understanding, returned demonstration, and verbal cues required  HOME EXERCISE PROGRAM: Access Code: [email or web address] URL: [email or web address] Date: 03/23/2023 Prepared by: Vladimir Faster  Exercises - Seated Knee Flexion Extension AAROM with Overpressure  - 1 x daily - 7 x weekly - 3 sets - 10 reps - Seated Long Arc Quad  - 3-5 x daily -  7 x weekly - 2 sets - 10 reps - 3 seconds hold - Seated Heel Slide  - 3-4 x daily - 7 x weekly - 2 sets - 10 reps - 5 seconds hold - Supine Heel Slide with Strap  - 3-4 x daily - 7 x weekly - 2 sets - 10 reps - 5 seconds hold - Long Sitting Quad Set with Towel Roll Under Heel  - 3-4 x daily - 7 x weekly - 2 sets - 10 reps - Sit to Stand  - 3-4 x daily - 7 x weekly - 10 reps - Hooklying Hamstring Stretch with Strap  - 2 x daily - 7 x weekly - 3 sets - 10 reps - Gastroc Stretch on Step  - 2-3 x daily - 7 x weekly - 1 sets - 2-3 reps - 20-30 seconds hold - Seated Table Hamstring Stretch  - 2-3 x daily - 7 x weekly - 1 sets - 2-3 reps - 20-30 seconds  hold - Supine Quadriceps Stretch with Strap on Table  - 2-3 x daily - 7 x weekly - 1 sets - 2-3 reps - 20-30 seconds hold  ASSESSMENT:  CLINICAL IMPRESSION:  Pt arrives today OK, adjusted his cane as appropriate as it was a bit low upon entry. Otherwise continued working on ROM and strength and appropriate. Definitely making steady gains, will continue efforts and progress as able. ROM is really coming along nicely, had a solid 110* of active range today so we focused more on strength and balance this session.     OBJECTIVE IMPAIRMENTS: decreased balance, decreased mobility, difficulty walking, decreased ROM, decreased strength, increased edema, and pain.   ACTIVITY LIMITATIONS: bending, standing, squatting, sleeping, stairs, and transfers  PARTICIPATION LIMITATIONS: driving and community activity  PERSONAL FACTORS: 3+ comorbidities: see pertinent history above  are also affecting patient's functional outcome.   REHAB POTENTIAL: Good  CLINICAL DECISION MAKING: Stable/uncomplicated  EVALUATION COMPLEXITY: Low   GOALS: Goals reviewed with patient? Yes  SHORT TERM GOALS: (target date for Short term goals are 3 weeks 04/08/23)   1.  Patient will demonstrate independent use of home exercise program to maintain progress from in clinic treatments.  Goal status: Ongoing 03/23/2023  2. Pt will improve his 5 time sit to stand to </= 15 sec with UE support.  Goal status: Ongoing 03/23/2023  LONG TERM GOALS: (target dates for all long term goals are 12 weeks 06/10/23 )   1. Patient will demonstrate/report pain at worst less than or equal to 2/10 to facilitate minimal limitation in daily activity secondary to pain symptoms.  Goal status: Ongoing 03/23/2023   2. Patient will demonstrate independent use of home exercise program to facilitate ability to maintain/progress functional gains from skilled physical therapy services.  Goal status: Ongoing 03/23/2023   3. Patient will  demonstrate FOTO outcome > or = 10 % from baseline to indicate reduced disability due to condition.  Goal status: Ongoing 03/23/2023   4.  Patient will demonstrate left LE MMT 5/5 throughout to faciltiate usual transfers, stairs, squatting at [facility] for daily life.   Goal status: Ongoing 03/23/2023   5.  Patient will demonstrate stair navigation using reciprocal gait pattern using single hand rail.  Goal status: Ongoing 03/23/2023   6.  Pt will be able to demonstrate ball room maneuvers with pain </= 2/10.  Goal status: Ongoing 03/23/2023  7. Pt will improve his left knee flexion to >/= 115 degrees to improve functional mobility.  Goal Status: Ongoing 03/23/2023      PLAN:  PT FREQUENCY: 1-2x/week  PT DURATION: 10 weeks  PLANNED INTERVENTIONS: Can include 30865- PT Re-evaluation, 97110-Therapeutic exercises, 97530- Therapeutic activity, 97112- Neuromuscular re-education, 97535- Self Care, 97140- Manual therapy, [phone]- Gait training, [phone]- Orthotic Fit/training, [phone]- Canalith repositioning, [id number]- Aquatic Therapy, 97014- Electrical stimulation (unattended), [id number]- Electrical stimulation (manual), [id number]- Vasopneumatic device, [id number]- Ultrasound, [id number]- Traction (mechanical), [id number]- Ionotophoresis 4mg /ml Dexamethasone, Patient/Family education, Balance training, Stair training, Taping, Dry Needling, Joint mobilization, Joint manipulation, Spinal manipulation, Spinal mobilization, Scar mobilization, Vestibular training, Visual/preceptual remediation/compensation, DME instructions, Cryotherapy, and Moist heat.  All performed as medically necessary.  All included unless contraindicated  PLAN FOR NEXT SESSION: check STGs next session. Continue to progress cane use and stair neg.   Manual therapy and exercises including functional for ROM (flexion emphasis) & quadriceps strengthening, vaso as needed. Needs progress note prior to MD appointment on 04/08/23   Nedra Hai, PT, DPT 04/01/23  1:42 PM

## 2023-04-04 ENCOUNTER — Encounter: Payer: Self-pay | Admitting: Physical Therapy

## 2023-04-04 ENCOUNTER — Ambulatory Visit: Payer: PPO | Admitting: Physical Therapy

## 2023-04-04 DIAGNOSIS — R262 Difficulty in walking, not elsewhere classified: Secondary | ICD-10-CM

## 2023-04-04 DIAGNOSIS — M6281 Muscle weakness (generalized): Secondary | ICD-10-CM | POA: Diagnosis not present

## 2023-04-04 DIAGNOSIS — R600 Localized edema: Secondary | ICD-10-CM | POA: Diagnosis not present

## 2023-04-04 DIAGNOSIS — M25562 Pain in left knee: Secondary | ICD-10-CM

## 2023-04-04 NOTE — Therapy (Signed)
OUTPATIENT PHYSICAL THERAPY LOWER EXTREMITY TREATMENT & PROGRESS NOTE   Patient Name: Nicholas Rasmussen MRN: [id number] DOB:[birth year], 77 y.o., male Today's Date: 04/04/2023  Progress Note Reporting Period 03/15/2023 to 04/04/2023  See note below for Objective Data and Assessment of Progress/Goals.   END OF SESSION:  PT End of Session - 04/04/23 1105     Visit Number 8    Number of Visits 24    Date for PT Re-Evaluation 06/10/23    Progress Note Due on Visit 18    PT Start Time 1101    PT Stop Time 1155    PT Time Calculation (min) 54 min    Activity Tolerance Patient tolerated treatment well;No increased pain    Behavior During Therapy [facility] for tasks assessed/performed                   Past Medical History:  Diagnosis Date   Allergic rhinitis 05/07/2014   Arthritis    Diabetes mellitus without complication (HCC)    Dizziness 01/02/2016   GERD (gastroesophageal reflux disease)    Headache 03/13/2015   Kidney lesion 07/22/2020   right, followed by urology   Left flank pain 01/02/2016   Low back pain 06/06/2015   Pain of left side of body 08/13/2015   Post herpetic neuralgia    Routine general medical examination at a health care facility 05/07/2014   Seizure disorder (HCC) 05/07/2014   Seizures (HCC)    Last seizure in the 70s.   Shingles    Stye 05/07/2014   Past Surgical History:  Procedure Laterality Date   COLONOSCOPY     KNEE SURGERY Left    left miniscus   TOTAL KNEE ARTHROPLASTY Left 02/28/2023   Procedure: TOTAL KNEE ARTHROPLASTY;  Surgeon: Tarry Kos, MD;  Location: [facility] OR;  Service: Orthopedics;  Laterality: Left;   UPPER GASTROINTESTINAL ENDOSCOPY     uvala surgery     Patient Active Problem List   Diagnosis Date Noted   Status post total left knee replacement 02/28/2023   Renal mass, right; Bosniak 4 complex cyst; 1 cm 09/09/2022   Vasomotor rhinitis 03/03/2022   Acute medial meniscus tear of left knee 02/04/2022   Primary  osteoarthritis of left knee 02/04/2022   Aortic root dilatation (HCC) 12/18/2021   Slow transit constipation 12/08/2021   Left-sided chest wall pain 12/08/2021   Multiple pulmonary nodules 10/06/2021   Hyperlipidemia associated with type 2 diabetes mellitus (HCC) 08/19/2021   [name] 04/07/2021   Complex renal cyst 04/07/2021   Varicose veins of both lower extremities with pain 02/02/2019   Encounter for therapeutic drug level monitoring 09/19/2018   Type 2 diabetes mellitus with diabetic polyneuropathy, without long-term current use of insulin (HCC) 03/29/2018   Diabetic neuropathy (HCC) 03/29/2018   Chronic pain of left knee 03/23/2018   Coronary artery disease involving native coronary artery of native heart with angina pectoris (HCC) 08/03/2017   Post herpetic neuralgia 07/29/2016   Dizziness 01/02/2016   Chronic midline low back pain without sciatica 06/06/2015   Nonintractable episodic headache 03/13/2015   GERD (gastroesophageal reflux disease) 11/15/2014   Seizure disorder (HCC) 05/07/2014   Allergic rhinitis 05/07/2014    PCP: Anne Ng, NP  REFERRING PROVIDER: Tarry Kos, MD  REFERRING DIAG:  Diagnosis  M17.12 (ICD-10-CM) - Primary osteoarthritis of left knee    THERAPY DIAG:  Acute pain of left knee  Muscle weakness (generalized)  Difficulty in walking, not elsewhere  classified  Localized edema  Rationale for Evaluation and Treatment: Rehabilitation  ONSET DATE: 02/28/23  SUBJECTIVE:   SUBJECTIVE STATEMENT: He bought a tall adult cane and no issues going into community.  He went to bed last night without pain meds for first time and no increased pain this morning.   PERTINENT HISTORY: Pt s/p left TKA on 02/28/23.PMH: diabetes, HTN, CAD, seizures, OA and medial meniscus tear of left knee   PAIN:  NPRS scale:  today 3/10 and since last PT session 0/10 - 5/10 Pain location: left knee Pain description:  shooting on and off  Aggravating factors: bending, sleeping, sit to stand Relieving factors: resting, taking pain meds, ice  PRECAUTIONS: None  WEIGHT BEARING RESTRICTIONS: No  FALLS:  Has patient fallen in last 6 months? No  LIVING ENVIRONMENT: Lives in: House/apartment Stairs: 1 flight on stairs, said he goes up and down sideways  OCCUPATION:  retired -- enjoys dancing,   PLOF: Independent  PATIENT GOALS:  get back to dancing   Next MD visit: November 15 @ 10:15  OBJECTIVE:   DIAGNOSTIC FINDINGS: FINDINGS: Left knee arthroplasty in expected alignment. No periprosthetic lucency or fracture. Recent postsurgical change includes air and edema in the soft tissues and joint space. Vascular calcifications posteriorly peer   IMPRESSION: Left knee arthroplasty without immediate postoperative complication.  PATIENT SURVEYS:  03/18/23: FOTO intake: 40 (Goal 59 is 16 visits) 04/04/2023: visit 8   58%  COGNITION: Overall cognitive status: WFL    SENSATION: WFL  EDEMA:  Circumferential: Left:   centimeters Rt: 44.5 centimeters   PALPATION: TTP around incision site and medial and lateral joint tenderness  LOWER EXTREMITY ROM:   ROM Left 03/15/23 supine Left 03/18/23 Left 03/25/23 Left Supine 03/28/23 Left  supine 04/01/23  Hip flexion       Hip extension       Hip abduction       Hip adduction       Hip internal rotation       Hip external rotation       Knee flexion A: 70 P; 74 A:  92 AROM 102*, PROM 105* seated edge of mat table A: 106 P: 110 AROM 112*  Knee extension A: -4 P: 0 A: -3 AROM -3* supine heel prop  A: -4 P: -2 AROM -2*  Ankle dorsiflexion       Ankle plantarflexion       Ankle inversion       Ankle eversion        (Blank rows = not tested)  LOWER EXTREMITY MMT:  MMT Right 03/15/23 Left 03/15/23 Right 04/04/23 Left  04/04/23  Hip flexion 5 4    Hip extension      Hip abduction      Hip adduction      Hip internal rotation       Hip external rotation      Knee flexion 5 4    Knee extension 5 3- Hand held dynameter 85.6# 80.8# Hand held dynameter 50.8# 49.8# LLE:RLE 61%  Ankle dorsiflexion      Ankle plantarflexion      Ankle inversion      Ankle eversion       (Blank rows = not tested)    FUNCTIONAL TESTS:  04/04/2023:  5 time sit to stand: 26.1 seconds without UE support 03/15/23: 5 time sit to stand: 27.5 seconds c UE support  GAIT: 04/04/2023 Pt amb with cane >200' safely.   03/15/23 Distance walked:  clinic distances Assistive device utilized: Walker - 2 wheeled Level of assistance: Modified independence Comments: antalgic gait with increased knee flexion and decreased heel strike                                                                                                                                                                        TODAY'S TREATMENT                                                                          DATE: 04/04/2023: Therapeutic Exercise: Recumbent bike seat 11 LEs only level 1 for 8 min. Leg press BLEs 125# 15 reps 2 sets; LLE only 62# 10 reps 2 sets Stepping over hurdle with active knee flexion and hold step position for 5 sec alternating LEs - 10 reps over 6" hurdle and 10 reps over 9" hurdle PT discussed starting gym 1x/wk while still under PT care so can have guidance.  Recommendation to start with bike and leg press machine. Pt verbalized understanding.  LAQ 5# 10 reps 2 sets See objective data   04/01/23 TherEx  SciFit bike seat 16--> 13   x10 minutes full rotations for w/u and ROM Adjusted height of cane- had it too low upon entry to clinic  Gait training with cane at new height, cues for heel toe pattern and equal stance times/step lengths  Forward step ups 6 inch box x12 L LE Tandem stance 3x30 seconds B solid surface Lateral step ups 6 inch box x12 L LE cues to shift over LE prior to step up  Narrow BOS blue foam pad 3x30 seconds 4 inch  box forward step downs x10  LLE  Tandem gait in // bars x3 laps forwards only       03/30/2023: Therapeutic Exercise: SciFit bike seat 16 LEs only level 1 for 8 min. Gastroc stretch step heel depression 20 sec hold 2 reps Knee flexion stretch with LLE on 2nd step forward weight shift 10 sec hold 10 reps Hamstring stretch straight leg on 2nd step trunk flexion 20 sec hold 2 reps LAQ 5# 10 reps 2 sets Quad stretch hoooklying supine 20 sec hold 2 reps Bridge matching knee flexion BLEs 3 reps 4 sets moving feet closer with each set   Therapeutic Activities: PT demo & verbal cues on gait with cane. Pt amb 200' with cane with supervision and no signs of knee instability. Stairs flight 11 steps with 2 rails: descend step-to  LLE eccentric and ascend alternating pattern.  PT demo & verbal cues prior to activity.    Modalities:  Vasopenumatic device: 34 deg, medium compression, x 10 minutes LE elevated    PATIENT EDUCATION:  Education details: HEP, POC Person educated: Patient Education method: Programmer, multimedia, Demonstration, Verbal cues, and Handouts Education comprehension: verbalized understanding, returned demonstration, and verbal cues required  HOME EXERCISE PROGRAM: Access Code: [email or web address] URL: [email or web address] Date: 03/23/2023 Prepared by: Vladimir Faster  Exercises - Seated Knee Flexion Extension AAROM with Overpressure  - 1 x daily - 7 x weekly - 3 sets - 10 reps - Seated Long Arc Quad  - 3-5 x daily - 7 x weekly - 2 sets - 10 reps - 3 seconds hold - Seated Heel Slide  - 3-4 x daily - 7 x weekly - 2 sets - 10 reps - 5 seconds hold - Supine Heel Slide with Strap  - 3-4 x daily - 7 x weekly - 2 sets - 10 reps - 5 seconds hold - Long Sitting Quad Set with Towel Roll Under Heel  - 3-4 x daily - 7 x weekly - 2 sets - 10 reps - Sit to Stand  - 3-4 x daily - 7 x weekly - 10 reps - Hooklying Hamstring Stretch with Strap  - 2 x daily - 7 x weekly - 3 sets - 10 reps -  Gastroc Stretch on Step  - 2-3 x daily - 7 x weekly - 1 sets - 2-3 reps - 20-30 seconds hold - Seated Table Hamstring Stretch  - 2-3 x daily - 7 x weekly - 1 sets - 2-3 reps - 20-30 seconds hold - Supine Quadriceps Stretch with Strap on Table  - 2-3 x daily - 7 x weekly - 1 sets - 2-3 reps - 20-30 seconds hold  ASSESSMENT:  CLINICAL IMPRESSION: Patient has progressed well with skilled PT intervention following his TKA.  He needs further skilled PT to improve functional strength & balance.   OBJECTIVE IMPAIRMENTS: decreased balance, decreased mobility, difficulty walking, decreased ROM, decreased strength, increased edema, and pain.   ACTIVITY LIMITATIONS: bending, standing, squatting, sleeping, stairs, and transfers  PARTICIPATION LIMITATIONS: driving and community activity  PERSONAL FACTORS: 3+ comorbidities: see pertinent history above  are also affecting patient's functional outcome.   REHAB POTENTIAL: Good  CLINICAL DECISION MAKING: Stable/uncomplicated  EVALUATION COMPLEXITY: Low   GOALS: Goals reviewed with patient? Yes  SHORT TERM GOALS: (target date for Short term goals are 3 weeks 04/08/23)   1.  Patient will demonstrate independent use of home exercise program to maintain progress from in clinic treatments.  Goal status: Ongoing 03/23/2023  2. Pt will improve his 5 time sit to stand to </= 15 sec with UE support.  Goal status: Ongoing 03/23/2023  LONG TERM GOALS: (target dates for all long term goals are 12 weeks 06/10/23 )   1. Patient will demonstrate/report pain at worst less than or equal to 2/10 to facilitate minimal limitation in daily activity secondary to pain symptoms.  Goal status: Ongoing 03/23/2023   2. Patient will demonstrate independent use of home exercise program to facilitate ability to maintain/progress functional gains from skilled physical therapy services.  Goal status: Ongoing 03/23/2023   3. Patient will demonstrate FOTO outcome > or = 10  % from baseline to indicate reduced disability due to condition.  Goal status: Ongoing 03/23/2023   4.  Patient will demonstrate left LE MMT 5/5 throughout to faciltiate usual transfers, stairs, squatting at [facility]  for daily life.   Goal status: Ongoing 03/23/2023   5.  Patient will demonstrate stair navigation using reciprocal gait pattern using single hand rail.  Goal status: Ongoing 03/23/2023   6.  Pt will be able to demonstrate ball room maneuvers with pain </= 2/10.  Goal status: Ongoing 03/23/2023  7. Pt will improve his left knee flexion to >/= 115 degrees to improve functional mobility.   Goal Status: Ongoing 03/23/2023      PLAN:  PT FREQUENCY: 1-2x/week  PT DURATION: 10 weeks  PLANNED INTERVENTIONS: Can include 40981- PT Re-evaluation, 97110-Therapeutic exercises, 97530- Therapeutic activity, 97112- Neuromuscular re-education, 97535- Self Care, 97140- Manual therapy, [id number]- Gait training, [phone]- Orthotic Fit/training, [id number]- Canalith repositioning, [id number]- Aquatic Therapy, 97014- Electrical stimulation (unattended), [id number]- Electrical stimulation (manual), [id number]- Vasopneumatic device, [id number]- Ultrasound, [id number]- Traction (mechanical), [id number]- Ionotophoresis 4mg /ml Dexamethasone, Patient/Family education, Balance training, Stair training, Taping, Dry Needling, Joint mobilization, Joint manipulation, Spinal manipulation, Spinal mobilization, Scar mobilization, Vestibular training, Visual/preceptual remediation/compensation, DME instructions, Cryotherapy, and Moist heat.  All performed as medically necessary.  All included unless contraindicated  PLAN FOR NEXT SESSION: check if he joined [employer] yet, begin to educate in exercises at gym, Continue to progress cane use adding ramp & curb and stair neg.   Manual therapy and exercises including functional for ROM (flexion emphasis) & quadriceps strengthening, vaso as needed.     Vladimir Faster, PT, DPT 04/04/2023, 12:33 PM

## 2023-04-07 NOTE — Progress Notes (Signed)
Post-Op Visit Note   Patient: Nicholas Rasmussen           Date of Birth: Jun 03, 1945           MRN: [id number] Visit Date: 04/08/2023 PCP: Anne Ng, NP   Assessment & Plan:  Chief Complaint:  Chief Complaint  Patient presents with   Left Knee - Routine Post Op   Visit Diagnoses:  1. Status post total left knee replacement     Plan: [name] is 6 weeks postop from a left total knee replacement.  He is doing well overall.  He has no real complaints.  Exam of the left knee shows fully healed surgical scar.  Range of motion is progressing very well.  Collaterals are stable.  I am very happy with his progress.  I think he does not need any formal physical therapy past November 22.  His range of motion is coming along very well and I think he can just do home exercises after his last physical therapy appointment.  I would like to recheck him in 6 weeks.  Follow-Up Instructions: Return in about 6 weeks (around 05/20/2023) for with lindsey.   Orders:  Orders Placed This Encounter  Procedures   XR Knee 1-2 Views Left   No orders of the defined types were placed in this encounter.   Imaging: XR Knee 1-2 Views Left  Result Date: 04/08/2023 X-rays of the left knee show a stable left total knee replacement in good alignment.    PMFS History: Patient Active Problem List   Diagnosis Date Noted   Status post total left knee replacement 02/28/2023   Renal mass, right; Bosniak 4 complex cyst; 1 cm 09/09/2022   Vasomotor rhinitis 03/03/2022   Acute medial meniscus tear of left knee 02/04/2022   Primary osteoarthritis of left knee 02/04/2022   Aortic root dilatation (HCC) 12/18/2021   Slow transit constipation 12/08/2021   Left-sided chest wall pain 12/08/2021   Multiple pulmonary nodules 10/06/2021   Hyperlipidemia associated with type 2 diabetes mellitus (HCC) 08/19/2021   [name] 04/07/2021   Complex renal cyst 04/07/2021    Varicose veins of both lower extremities with pain 02/02/2019   Encounter for therapeutic drug level monitoring 09/19/2018   Type 2 diabetes mellitus with diabetic polyneuropathy, without long-term current use of insulin (HCC) 03/29/2018   Diabetic neuropathy (HCC) 03/29/2018   Chronic pain of left knee 03/23/2018   Coronary artery disease involving native coronary artery of native heart with angina pectoris (HCC) 08/03/2017   Post herpetic neuralgia 07/29/2016   Dizziness 01/02/2016   Chronic midline low back pain without sciatica 06/06/2015   Nonintractable episodic headache 03/13/2015   GERD (gastroesophageal reflux disease) 11/15/2014   Seizure disorder (HCC) 05/07/2014   Allergic rhinitis 05/07/2014   Past Medical History:  Diagnosis Date   Allergic rhinitis 05/07/2014   Arthritis    Diabetes mellitus without complication (HCC)    Dizziness 01/02/2016   GERD (gastroesophageal reflux disease)    Headache 03/13/2015   Kidney lesion 07/22/2020   right, followed by urology   Left flank pain 01/02/2016   Low back pain 06/06/2015   Pain of left side of body 08/13/2015   Post herpetic neuralgia    Routine general medical examination at a health care facility 05/07/2014   Seizure disorder (HCC) 05/07/2014   Seizures (HCC)    Last seizure in the 70s.   Shingles    Stye 05/07/2014    Family  History  Problem Relation Age of Onset   Other Mother        natural causes   Diabetes Father    Hypertension Father    Kidney cancer Brother    Colon cancer Neg Hx    Stomach cancer Neg Hx    Esophageal cancer Neg Hx    Colon polyps Neg Hx     Past Surgical History:  Procedure Laterality Date   COLONOSCOPY     KNEE SURGERY Left    left miniscus   TOTAL KNEE ARTHROPLASTY Left 02/28/2023   Procedure: TOTAL KNEE ARTHROPLASTY;  Surgeon: Tarry Kos, MD;  Location: [facility] OR;  Service: Orthopedics;  Laterality: Left;   UPPER GASTROINTESTINAL ENDOSCOPY     uvala surgery     Social  History   Occupational History   Occupation: Retired    Comment: Printmaker  Tobacco Use   Smoking status: Former   Smokeless tobacco: Never  Advertising account planner   Vaping status: Never Used  Substance and Sexual Activity   Alcohol use: Yes    Alcohol/week: 0.0 standard drinks of alcohol    Comment: occasional   Drug use: No   Sexual activity: Not on file

## 2023-04-08 ENCOUNTER — Encounter: Payer: Self-pay | Admitting: Physical Therapy

## 2023-04-08 ENCOUNTER — Ambulatory Visit (INDEPENDENT_AMBULATORY_CARE_PROVIDER_SITE_OTHER): Payer: PPO | Admitting: Orthopaedic Surgery

## 2023-04-08 ENCOUNTER — Ambulatory Visit (INDEPENDENT_AMBULATORY_CARE_PROVIDER_SITE_OTHER): Payer: Self-pay

## 2023-04-08 ENCOUNTER — Ambulatory Visit: Payer: PPO | Admitting: Physical Therapy

## 2023-04-08 DIAGNOSIS — R262 Difficulty in walking, not elsewhere classified: Secondary | ICD-10-CM

## 2023-04-08 DIAGNOSIS — M25562 Pain in left knee: Secondary | ICD-10-CM | POA: Diagnosis not present

## 2023-04-08 DIAGNOSIS — R600 Localized edema: Secondary | ICD-10-CM

## 2023-04-08 DIAGNOSIS — Z96652 Presence of left artificial knee joint: Secondary | ICD-10-CM

## 2023-04-08 DIAGNOSIS — M6281 Muscle weakness (generalized): Secondary | ICD-10-CM | POA: Diagnosis not present

## 2023-04-08 NOTE — Therapy (Signed)
OUTPATIENT PHYSICAL THERAPY LOWER EXTREMITY TREATMENT   Patient Name: Nicholas Rasmussen MRN: [id number] DOB:1946/03/15, 77 y.o., male Today's Date: 04/08/2023    END OF SESSION:  PT End of Session - 04/08/23 1312     Visit Number 9    Number of Visits 24    Date for PT Re-Evaluation 06/10/23    Progress Note Due on Visit 18    PT Start Time 1302    PT Stop Time 1342    PT Time Calculation (min) 40 min    Activity Tolerance Patient tolerated treatment well;No increased pain    Behavior During Therapy [facility] for tasks assessed/performed                    Past Medical History:  Diagnosis Date   Allergic rhinitis 05/07/2014   Arthritis    Diabetes mellitus without complication (HCC)    Dizziness 01/02/2016   GERD (gastroesophageal reflux disease)    Headache 03/13/2015   Kidney lesion 07/22/2020   right, followed by urology   Left flank pain 01/02/2016   Low back pain 06/06/2015   Pain of left side of body 08/13/2015   Post herpetic neuralgia    Routine general medical examination at a health care facility 05/07/2014   Seizure disorder (HCC) 05/07/2014   Seizures (HCC)    Last seizure in the 70s.   Shingles    Stye 05/07/2014   Past Surgical History:  Procedure Laterality Date   COLONOSCOPY     KNEE SURGERY Left    left miniscus   TOTAL KNEE ARTHROPLASTY Left 02/28/2023   Procedure: TOTAL KNEE ARTHROPLASTY;  Surgeon: Tarry Kos, MD;  Location: [facility] OR;  Service: Orthopedics;  Laterality: Left;   UPPER GASTROINTESTINAL ENDOSCOPY     uvala surgery     Patient Active Problem List   Diagnosis Date Noted   Status post total left knee replacement 02/28/2023   Renal mass, right; Bosniak 4 complex cyst; 1 cm 09/09/2022   Vasomotor rhinitis 03/03/2022   Acute medial meniscus tear of left knee 02/04/2022   Primary osteoarthritis of left knee 02/04/2022   Aortic root dilatation (HCC) 12/18/2021   Slow transit constipation 12/08/2021   Left-sided chest wall  pain 12/08/2021   Multiple pulmonary nodules 10/06/2021   Hyperlipidemia associated with type 2 diabetes mellitus (HCC) 08/19/2021   [name] 04/07/2021   Complex renal cyst 04/07/2021   Varicose veins of both lower extremities with pain 02/02/2019   Encounter for therapeutic drug level monitoring 09/19/2018   Type 2 diabetes mellitus with diabetic polyneuropathy, without long-term current use of insulin (HCC) 03/29/2018   Diabetic neuropathy (HCC) 03/29/2018   Chronic pain of left knee 03/23/2018   Coronary artery disease involving native coronary artery of native heart with angina pectoris (HCC) 08/03/2017   Post herpetic neuralgia 07/29/2016   Dizziness 01/02/2016   Chronic midline low back pain without sciatica 06/06/2015   Nonintractable episodic headache 03/13/2015   GERD (gastroesophageal reflux disease) 11/15/2014   Seizure disorder (HCC) 05/07/2014   Allergic rhinitis 05/07/2014    PCP: Anne Ng, NP  REFERRING PROVIDER: Tarry Kos, MD  REFERRING DIAG:  Diagnosis  M17.12 (ICD-10-CM) - Primary osteoarthritis of left knee    THERAPY DIAG:  Acute pain of left knee  Muscle weakness (generalized)  Difficulty in walking, not elsewhere classified  Localized edema  Rationale for Evaluation and Treatment: Rehabilitation  ONSET DATE: 02/28/23  SUBJECTIVE:   SUBJECTIVE  STATEMENT:  Neck is bothering me today is there something I can do about it? Knee is feeling good, trying to work on steps to improve  PERTINENT HISTORY: Pt s/p left TKA on 02/28/23.PMH: diabetes, HTN, CAD, seizures, OA and medial meniscus tear of left knee   PAIN:  NPRS scale:  "some pains in front of shin, it has its moments"/10 Pain location: left knee Pain description: shooting on and off  Aggravating factors: bending, sleeping, sit to stand Relieving factors: resting, taking pain meds, ice  PRECAUTIONS: None  WEIGHT BEARING RESTRICTIONS:  No  FALLS:  Has patient fallen in last 6 months? No  LIVING ENVIRONMENT: Lives in: House/apartment Stairs: 1 flight on stairs, said he goes up and down sideways  OCCUPATION:  retired -- enjoys dancing,   PLOF: Independent  PATIENT GOALS:  get back to dancing   Next MD visit: November 15 @ 10:15  OBJECTIVE:   DIAGNOSTIC FINDINGS: FINDINGS: Left knee arthroplasty in expected alignment. No periprosthetic lucency or fracture. Recent postsurgical change includes air and edema in the soft tissues and joint space. Vascular calcifications posteriorly peer   IMPRESSION: Left knee arthroplasty without immediate postoperative complication.  PATIENT SURVEYS:  03/18/23: FOTO intake: 40 (Goal 59 is 16 visits) 04/04/2023: visit 8   58%  COGNITION: Overall cognitive status: WFL    SENSATION: WFL  EDEMA:  Circumferential: Left:   centimeters Rt: 44.5 centimeters   PALPATION: TTP around incision site and medial and lateral joint tenderness  LOWER EXTREMITY ROM:   ROM Left 03/15/23 supine Left 03/18/23 Left 03/25/23 Left Supine 03/28/23 Left  supine 04/01/23  Hip flexion       Hip extension       Hip abduction       Hip adduction       Hip internal rotation       Hip external rotation       Knee flexion A: 70 P; 74 A:  92 AROM 102*, PROM 105* seated edge of mat table A: 106 P: 110 AROM 112*  Knee extension A: -4 P: 0 A: -3 AROM -3* supine heel prop  A: -4 P: -2 AROM -2*  Ankle dorsiflexion       Ankle plantarflexion       Ankle inversion       Ankle eversion        (Blank rows = not tested)  LOWER EXTREMITY MMT:  MMT Right 03/15/23 Left 03/15/23 Right 04/04/23 Left  04/04/23  Hip flexion 5 4    Hip extension      Hip abduction      Hip adduction      Hip internal rotation      Hip external rotation      Knee flexion 5 4    Knee extension 5 3- Hand held dynameter 85.6# 80.8# Hand held dynameter 50.8# 49.8# LLE:RLE 61%  Ankle dorsiflexion       Ankle plantarflexion      Ankle inversion      Ankle eversion       (Blank rows = not tested)    FUNCTIONAL TESTS:  04/04/2023:  5 time sit to stand: 26.1 seconds without UE support 03/15/23: 5 time sit to stand: 27.5 seconds c UE support  GAIT: 04/04/2023 Pt amb with cane >200' safely.   03/15/23 Distance walked: clinic distances Assistive device utilized: Environmental consultant - 2 wheeled Level of assistance: Modified independence Comments: antalgic gait with increased knee flexion and decreased heel strike  TODAY'S TREATMENT                                                                          DATE:   04/08/23  TherEx  Recumbent bike seat 14-->12 x8 minutes for w/u and tissue perfusion full rotations  LAQs 7# 2x10  Forward step ups 6 inch box x15 L LE  Forward step downs 4 inch box x12 L LE  Shuttle LE press 125# 2x20, L LE 62# 2x15     NMR  SLS with one foot on 6 inch box 3x30 seconds B solid surface  Standing on blue foam pad alternating toe taps to 4 inch box x20    04/04/2023: Therapeutic Exercise: Recumbent bike seat 11 LEs only level 1 for 8 min. Leg press BLEs 125# 15 reps 2 sets; LLE only 62# 10 reps 2 sets Stepping over hurdle with active knee flexion and hold step position for 5 sec alternating LEs - 10 reps over 6" hurdle and 10 reps over 9" hurdle PT discussed starting gym 1x/wk while still under PT care so can have guidance.  Recommendation to start with bike and leg press machine. Pt verbalized understanding.  LAQ 5# 10 reps 2 sets See objective data   04/01/23 TherEx  SciFit bike seat 16--> 13   x10 minutes full rotations for w/u and ROM Adjusted height of cane- had it too low upon entry to clinic  Gait training with cane at new height, cues for heel toe pattern and equal stance  times/step lengths  Forward step ups 6 inch box x12 L LE Tandem stance 3x30 seconds B solid surface Lateral step ups 6 inch box x12 L LE cues to shift over LE prior to step up  Narrow BOS blue foam pad 3x30 seconds 4 inch box forward step downs x10  LLE  Tandem gait in // bars x3 laps forwards only       03/30/2023: Therapeutic Exercise: SciFit bike seat 16 LEs only level 1 for 8 min. Gastroc stretch step heel depression 20 sec hold 2 reps Knee flexion stretch with LLE on 2nd step forward weight shift 10 sec hold 10 reps Hamstring stretch straight leg on 2nd step trunk flexion 20 sec hold 2 reps LAQ 5# 10 reps 2 sets Quad stretch hoooklying supine 20 sec hold 2 reps Bridge matching knee flexion BLEs 3 reps 4 sets moving feet closer with each set   Therapeutic Activities: PT demo & verbal cues on gait with cane. Pt amb 200' with cane with supervision and no signs of knee instability. Stairs flight 11 steps with 2 rails: descend step-to LLE eccentric and ascend alternating pattern.  PT demo & verbal cues prior to activity.    Modalities:  Vasopenumatic device: 34 deg, medium compression, x 10 minutes LE elevated    PATIENT EDUCATION:  Education details: HEP, POC Person educated: Patient Education method: Programmer, multimedia, Demonstration, Verbal cues, and Handouts Education comprehension: verbalized understanding, returned demonstration, and verbal cues required  HOME EXERCISE PROGRAM: Access Code: [email or web address] URL: [email or web address] Date: 03/23/2023 Prepared by: Vladimir Faster  Exercises - Seated Knee Flexion Extension AAROM with Overpressure  - 1 x daily - 7 x weekly - 3 sets - 10 reps -  Seated Long Arc Quad  - 3-5 x daily - 7 x weekly - 2 sets - 10 reps - 3 seconds hold - Seated Heel Slide  - 3-4 x daily - 7 x weekly - 2 sets - 10 reps - 5 seconds hold - Supine Heel Slide with Strap  - 3-4 x daily - 7 x weekly - 2 sets - 10 reps - 5 seconds hold - Long Sitting  Quad Set with Towel Roll Under Heel  - 3-4 x daily - 7 x weekly - 2 sets - 10 reps - Sit to Stand  - 3-4 x daily - 7 x weekly - 10 reps - Hooklying Hamstring Stretch with Strap  - 2 x daily - 7 x weekly - 3 sets - 10 reps - Gastroc Stretch on Step  - 2-3 x daily - 7 x weekly - 1 sets - 2-3 reps - 20-30 seconds hold - Seated Table Hamstring Stretch  - 2-3 x daily - 7 x weekly - 1 sets - 2-3 reps - 20-30 seconds hold - Supine Quadriceps Stretch with Strap on Table  - 2-3 x daily - 7 x weekly - 1 sets - 2-3 reps - 20-30 seconds hold  ASSESSMENT:  CLINICAL IMPRESSION:  Pt arrived doing well, continued with bike for ROM and tissue perfusion/warm up. Otherwise continued working on functional strength and balance today as ROM is really looking great. Advised heat pack x10-15 minutes and gentle cervical ROM at home to address neck pain, if it continues he will need MD order for [place] to really look at it in depth. Will continue to progress as able and tolerated.  Still working on getting to [employer], hasn't made it over quite yet.   OBJECTIVE IMPAIRMENTS: decreased balance, decreased mobility, difficulty walking, decreased ROM, decreased strength, increased edema, and pain.   ACTIVITY LIMITATIONS: bending, standing, squatting, sleeping, stairs, and transfers  PARTICIPATION LIMITATIONS: driving and community activity  PERSONAL FACTORS: 3+ comorbidities: see pertinent history above  are also affecting patient's functional outcome.   REHAB POTENTIAL: Good  CLINICAL DECISION MAKING: Stable/uncomplicated  EVALUATION COMPLEXITY: Low   GOALS: Goals reviewed with patient? Yes  SHORT TERM GOALS: (target date for Short term goals are 3 weeks 04/08/23)   1.  Patient will demonstrate independent use of home exercise program to maintain progress from in clinic treatments.  Goal status: Ongoing 03/23/2023  2. Pt will improve his 5 time sit to stand to </= 15 sec with UE support.  Goal status: Ongoing  03/23/2023  LONG TERM GOALS: (target dates for all long term goals are 12 weeks 06/10/23 )   1. Patient will demonstrate/report pain at worst less than or equal to 2/10 to facilitate minimal limitation in daily activity secondary to pain symptoms.  Goal status: Ongoing 03/23/2023   2. Patient will demonstrate independent use of home exercise program to facilitate ability to maintain/progress functional gains from skilled physical therapy services.  Goal status: Ongoing 03/23/2023   3. Patient will demonstrate FOTO outcome > or = 10 % from baseline to indicate reduced disability due to condition.  Goal status: Ongoing 03/23/2023   4.  Patient will demonstrate left LE MMT 5/5 throughout to faciltiate usual transfers, stairs, squatting at [facility] for daily life.   Goal status: Ongoing 03/23/2023   5.  Patient will demonstrate stair navigation using reciprocal gait pattern using single hand rail.  Goal status: Ongoing 03/23/2023   6.  Pt will be able to demonstrate ball room  maneuvers with pain </= 2/10.  Goal status: Ongoing 03/23/2023  7. Pt will improve his left knee flexion to >/= 115 degrees to improve functional mobility.   Goal Status: Ongoing 03/23/2023      PLAN:  PT FREQUENCY: 1-2x/week  PT DURATION: 10 weeks  PLANNED INTERVENTIONS: Can include 16109- PT Re-evaluation, 97110-Therapeutic exercises, 97530- Therapeutic activity, 97112- Neuromuscular re-education, 97535- Self Care, 97140- Manual therapy, [phone]- Gait training, [phone]- Orthotic Fit/training, [phone]- Canalith repositioning, [id number]- Aquatic Therapy, 97014- Electrical stimulation (unattended), [id number]- Electrical stimulation (manual), [id number]- Vasopneumatic device, [id number]- Ultrasound, [id number]- Traction (mechanical), [id number]- Ionotophoresis 4mg /ml Dexamethasone, Patient/Family education, Balance training, Stair training, Taping, Dry Needling, Joint mobilization, Joint manipulation, Spinal manipulation, Spinal mobilization, Scar  mobilization, Vestibular training, Visual/preceptual remediation/compensation, DME instructions, Cryotherapy, and Moist heat.  All performed as medically necessary.  All included unless contraindicated  PLAN FOR NEXT SESSION: check if he joined [employer] yet, begin to educate in exercises at gym, Continue to progress cane use adding ramp & curb and stair neg.   Manual therapy and exercises including functional for ROM (flexion emphasis) & quadriceps strengthening, vaso PRN    Nedra Hai, PT, DPT 04/08/23 1:42 PM

## 2023-04-11 ENCOUNTER — Ambulatory Visit: Payer: PPO | Admitting: Physical Therapy

## 2023-04-11 ENCOUNTER — Telehealth: Payer: Self-pay | Admitting: Orthopaedic Surgery

## 2023-04-11 ENCOUNTER — Encounter: Payer: Self-pay | Admitting: Physical Therapy

## 2023-04-11 DIAGNOSIS — M25562 Pain in left knee: Secondary | ICD-10-CM | POA: Diagnosis not present

## 2023-04-11 DIAGNOSIS — M6281 Muscle weakness (generalized): Secondary | ICD-10-CM

## 2023-04-11 DIAGNOSIS — R600 Localized edema: Secondary | ICD-10-CM | POA: Diagnosis not present

## 2023-04-11 DIAGNOSIS — R262 Difficulty in walking, not elsewhere classified: Secondary | ICD-10-CM | POA: Diagnosis not present

## 2023-04-11 NOTE — Therapy (Signed)
OUTPATIENT PHYSICAL THERAPY LOWER EXTREMITY TREATMENT   Patient Name: Nicholas Rasmussen MRN: [id number] DOB:12-Apr-1946, 77 y.o., male Today's Date: 04/11/2023    END OF SESSION:  PT End of Session - 04/11/23 1149     Visit Number 10    Number of Visits 24    Date for PT Re-Evaluation 06/10/23    Progress Note Due on Visit 18    PT Start Time 1145    PT Stop Time 1240    PT Time Calculation (min) 55 min    Activity Tolerance Patient tolerated treatment well;No increased pain    Behavior During Therapy [facility] for tasks assessed/performed                     Past Medical History:  Diagnosis Date   Allergic rhinitis 05/07/2014   Arthritis    Diabetes mellitus without complication (HCC)    Dizziness 01/02/2016   GERD (gastroesophageal reflux disease)    Headache 03/13/2015   Kidney lesion 07/22/2020   right, followed by urology   Left flank pain 01/02/2016   Low back pain 06/06/2015   Pain of left side of body 08/13/2015   Post herpetic neuralgia    Routine general medical examination at a health care facility 05/07/2014   Seizure disorder (HCC) 05/07/2014   Seizures (HCC)    Last seizure in the 70s.   Shingles    Stye 05/07/2014   Past Surgical History:  Procedure Laterality Date   COLONOSCOPY     KNEE SURGERY Left    left miniscus   TOTAL KNEE ARTHROPLASTY Left 02/28/2023   Procedure: TOTAL KNEE ARTHROPLASTY;  Surgeon: Tarry Kos, MD;  Location: [facility] OR;  Service: Orthopedics;  Laterality: Left;   UPPER GASTROINTESTINAL ENDOSCOPY     uvala surgery     Patient Active Problem List   Diagnosis Date Noted   Status post total left knee replacement 02/28/2023   Renal mass, right; Bosniak 4 complex cyst; 1 cm 09/09/2022   Vasomotor rhinitis 03/03/2022   Acute medial meniscus tear of left knee 02/04/2022   Primary osteoarthritis of left knee 02/04/2022   Aortic root dilatation (HCC) 12/18/2021   Slow transit constipation 12/08/2021   Left-sided chest wall  pain 12/08/2021   Multiple pulmonary nodules 10/06/2021   Hyperlipidemia associated with type 2 diabetes mellitus (HCC) 08/19/2021   [name] 04/07/2021   Complex renal cyst 04/07/2021   Varicose veins of both lower extremities with pain 02/02/2019   Encounter for therapeutic drug level monitoring 09/19/2018   Type 2 diabetes mellitus with diabetic polyneuropathy, without long-term current use of insulin (HCC) 03/29/2018   Diabetic neuropathy (HCC) 03/29/2018   Chronic pain of left knee 03/23/2018   Coronary artery disease involving native coronary artery of native heart with angina pectoris (HCC) 08/03/2017   Post herpetic neuralgia 07/29/2016   Dizziness 01/02/2016   Chronic midline low back pain without sciatica 06/06/2015   Nonintractable episodic headache 03/13/2015   GERD (gastroesophageal reflux disease) 11/15/2014   Seizure disorder (HCC) 05/07/2014   Allergic rhinitis 05/07/2014    PCP: Anne Ng, NP  REFERRING PROVIDER: Tarry Kos, MD  REFERRING DIAG:  Diagnosis  M17.12 (ICD-10-CM) - Primary osteoarthritis of left knee    THERAPY DIAG:  Acute pain of left knee  Muscle weakness (generalized)  Difficulty in walking, not elsewhere classified  Localized edema  Rationale for Evaluation and Treatment: Rehabilitation  ONSET DATE: 02/28/23  SUBJECTIVE:  SUBJECTIVE STATEMENT: He did his exercises and rode stationary bike in apt exercise room.  He did exercises on stairs that PT recommended. In home he does not use cane ~90% of time and community all times.    No knee instability noted.    PERTINENT HISTORY: Pt s/p left TKA on 02/28/23.PMH: diabetes, HTN, CAD, seizures, OA and medial meniscus tear of left knee   PAIN:  NPRS scale:  "some pains in front of shin, it has its moments"/10 Pain location: left knee Pain description: shooting on and off  Aggravating factors: bending, sleeping, sit to stand Relieving  factors: resting, taking pain meds, ice  PRECAUTIONS: None  WEIGHT BEARING RESTRICTIONS: No  FALLS:  Has patient fallen in last 6 months? No  LIVING ENVIRONMENT: Lives in: House/apartment Stairs: 1 flight on stairs, said he goes up and down sideways  OCCUPATION:  retired -- enjoys dancing,   PLOF: Independent  PATIENT GOALS:  get back to dancing   Next MD visit: November 15 @ 10:15  OBJECTIVE:   DIAGNOSTIC FINDINGS: FINDINGS: Left knee arthroplasty in expected alignment. No periprosthetic lucency or fracture. Recent postsurgical change includes air and edema in the soft tissues and joint space. Vascular calcifications posteriorly peer   IMPRESSION: Left knee arthroplasty without immediate postoperative complication.  PATIENT SURVEYS:  03/18/23: FOTO intake: 40 (Goal 59 is 16 visits) 04/04/2023: visit 8   58%  COGNITION: Overall cognitive status: WFL    SENSATION: WFL  EDEMA:  Circumferential: Left:   centimeters Rt: 44.5 centimeters   PALPATION: TTP around incision site and medial and lateral joint tenderness  LOWER EXTREMITY ROM:   ROM Left 03/15/23 supine Left 03/18/23 Left 03/25/23 Left Supine 03/28/23 Left  supine 04/01/23  Hip flexion       Hip extension       Hip abduction       Hip adduction       Hip internal rotation       Hip external rotation       Knee flexion A: 70 P; 74 A:  92 AROM 102*, PROM 105* seated edge of mat table A: 106 P: 110 AROM 112*  Knee extension A: -4 P: 0 A: -3 AROM -3* supine heel prop  A: -4 P: -2 AROM -2*  Ankle dorsiflexion       Ankle plantarflexion       Ankle inversion       Ankle eversion        (Blank rows = not tested)  LOWER EXTREMITY MMT:  MMT Right 03/15/23 Left 03/15/23 Right 04/04/23 Left  04/04/23  Hip flexion 5 4    Hip extension      Hip abduction      Hip adduction      Hip internal rotation      Hip external rotation      Knee flexion 5 4    Knee extension 5 3- Hand held  dynameter 85.6# 80.8# Hand held dynameter 50.8# 49.8# LLE:RLE 61%  Ankle dorsiflexion      Ankle plantarflexion      Ankle inversion      Ankle eversion       (Blank rows = not tested)    FUNCTIONAL TESTS:  04/04/2023:  5 time sit to stand: 26.1 seconds without UE support 03/15/23: 5 time sit to stand: 27.5 seconds c UE support  GAIT: 04/04/2023 Pt amb with cane >200' safely.   03/15/23 Distance walked: clinic distances Assistive device utilized: Environmental consultant -  2 wheeled Level of assistance: Modified independence Comments: antalgic gait with increased knee flexion and decreased heel strike                                                                                                                                                                        TODAY'S TREATMENT                                                                          DATE: 04/11/2023: Therapeutic Exercise: Precor recumbent bike seat 11 level 1 5 min and level 3 for 3 min. PT recommended for apt bike build up to 30 min total. Initially 10 min 2 sets with timed rest weaning rest time down until 20 min straight, then add 1 min as able up to 30 min total. Pt verbalized understanding.  PT verbally educated pt on exercise program to include endurance, flexibility, strength & balance activities. Pt verbalized understanding.  Leg press BLEs 131# 15 reps, single leg 62# 15 reps 2 sets for BLEs Tandem stance on foam beam 30 sec each with LLE in front & in back Stepping over hurdle with active knee flexion and stabilizing in step position 3 sec alternating LEs 10 reps ea 6" hurdle & 9" hurdle Slider to 3 cones (ant-lat, lat & post-lat) with stance LE flexion on outward motion and extension on inward motion 5 reps with ea LE.  Knee ext machine BLEs concentric & eccentric 20# 15 reps;  BLEs concentric & isometric / eccentric 5# 10 reps  Vaso 10 min medium compression 34* 10 min with elevation to limit edema & pain with  new exercises.     04/08/23  TherEx  Recumbent bike seat 14-->12 x8 minutes for w/u and tissue perfusion full rotations  LAQs 7# 2x10  Forward step ups 6 inch box x15 L LE  Forward step downs 4 inch box x12 L LE  Shuttle LE press 125# 2x20, L LE 62# 2x15     NMR  SLS with one foot on 6 inch box 3x30 seconds B solid surface  Standing on blue foam pad alternating toe taps to 4 inch box x20    04/04/2023: Therapeutic Exercise: Recumbent bike seat 11 LEs only level 1 for 8 min. Leg press BLEs 125# 15 reps 2 sets; LLE only 62# 10 reps 2 sets Stepping over hurdle with active knee flexion and hold step position for 5 sec alternating LEs - 10 reps over  6" hurdle and 10 reps over 9" hurdle PT discussed starting gym 1x/wk while still under PT care so can have guidance.  Recommendation to start with bike and leg press machine. Pt verbalized understanding.  LAQ 5# 10 reps 2 sets See objective data    PATIENT EDUCATION:  Education details: HEP, POC Person educated: Patient Education method: Explanation, Demonstration, Verbal cues, and Handouts Education comprehension: verbalized understanding, returned demonstration, and verbal cues required  HOME EXERCISE PROGRAM: Access Code: [email or web address] URL: [email or web address] Date: 03/23/2023 Prepared by: Vladimir Faster  Exercises - Seated Knee Flexion Extension AAROM with Overpressure  - 1 x daily - 7 x weekly - 3 sets - 10 reps - Seated Long Arc Quad  - 3-5 x daily - 7 x weekly - 2 sets - 10 reps - 3 seconds hold - Seated Heel Slide  - 3-4 x daily - 7 x weekly - 2 sets - 10 reps - 5 seconds hold - Supine Heel Slide with Strap  - 3-4 x daily - 7 x weekly - 2 sets - 10 reps - 5 seconds hold - Long Sitting Quad Set with Towel Roll Under Heel  - 3-4 x daily - 7 x weekly - 2 sets - 10 reps - Sit to Stand  - 3-4 x daily - 7 x weekly - 10 reps - Hooklying Hamstring Stretch with Strap  - 2 x daily - 7 x weekly - 3 sets - 10 reps -  Gastroc Stretch on Step  - 2-3 x daily - 7 x weekly - 1 sets - 2-3 reps - 20-30 seconds hold - Seated Table Hamstring Stretch  - 2-3 x daily - 7 x weekly - 1 sets - 2-3 reps - 20-30 seconds hold - Supine Quadriceps Stretch with Strap on Table  - 2-3 x daily - 7 x weekly - 1 sets - 2-3 reps - 20-30 seconds hold  ASSESSMENT:  CLINICAL IMPRESSION: PT is starting to function with more knee control and less pain spikes.  PT is educating pt on exercises at home & gym to maximize recovery of knee function which he appears to understand but needs further instruction.  PT plans to reduce frequency next week working on understanding of ongoing HEP / gym exercises.    OBJECTIVE IMPAIRMENTS: decreased balance, decreased mobility, difficulty walking, decreased ROM, decreased strength, increased edema, and pain.   ACTIVITY LIMITATIONS: bending, standing, squatting, sleeping, stairs, and transfers  PARTICIPATION LIMITATIONS: driving and community activity  PERSONAL FACTORS: 3+ comorbidities: see pertinent history above  are also affecting patient's functional outcome.   REHAB POTENTIAL: Good  CLINICAL DECISION MAKING: Stable/uncomplicated  EVALUATION COMPLEXITY: Low   GOALS: Goals reviewed with patient? Yes  SHORT TERM GOALS: (target date for Short term goals are 3 weeks 04/08/23)   1.  Patient will demonstrate independent use of home exercise program to maintain progress from in clinic treatments.  Goal status: Ongoing 03/23/2023  2. Pt will improve his 5 time sit to stand to </= 15 sec with UE support.  Goal status: Ongoing 03/23/2023  LONG TERM GOALS: (target dates for all long term goals are 12 weeks 06/10/23 )   1. Patient will demonstrate/report pain at worst less than or equal to 2/10 to facilitate minimal limitation in daily activity secondary to pain symptoms.  Goal status: Ongoing 04/11/2023   2. Patient will demonstrate independent use of home exercise program to facilitate  ability to maintain/progress functional gains from skilled physical therapy services.  Goal status: Ongoing  04/11/2023   3. Patient will demonstrate FOTO outcome > or = 59% from baseline to indicate reduced disability due to condition.  Goal status: Ongoing 04/11/2023   4.  Patient will demonstrate left LE MMT 5/5 throughout to faciltiate usual transfers, stairs, squatting at [facility] for daily life.   Goal status: Ongoing 04/11/2023   5.  Patient will demonstrate stair navigation using reciprocal gait pattern using single hand rail.  Goal status: Ongoing 04/11/2023   6.  Pt will be able to demonstrate ball room maneuvers with pain </= 2/10.  Goal status: Ongoing 04/11/2023  7. Pt will improve his left knee flexion to >/= 115 degrees to improve functional mobility.   Goal Status: Ongoing 04/11/2023      PLAN:  PT FREQUENCY: 1-2x/week  PT DURATION: 10 weeks  PLANNED INTERVENTIONS: Can include [id number]- PT Re-evaluation, 97110-Therapeutic exercises, 97530- Therapeutic activity, 97112- Neuromuscular re-education, 97535- Self Care, 97140- Manual therapy, [phone]- Gait training, [phone]- Orthotic Fit/training, [phone]- Canalith repositioning, [id number]- Aquatic Therapy, 97014- Electrical stimulation (unattended), [id number]- Electrical stimulation (manual), [id number]- Vasopneumatic device, [id number]- Ultrasound, [id number]- Traction (mechanical), [id number]- Ionotophoresis 4mg /ml Dexamethasone, Patient/Family education, Balance training, Stair training, Taping, Dry Needling, Joint mobilization, Joint manipulation, Spinal manipulation, Spinal mobilization, Scar mobilization, Vestibular training, Visual/preceptual remediation/compensation, DME instructions, Cryotherapy, and Moist heat.  All performed as medically necessary.  All included unless contraindicated  PLAN FOR NEXT SESSION: check ROM,  instruct in progressive exercises for functional activities especially quadriceps strengthening, vaso PRN     Vladimir Faster, PT,  DPT 04/11/2023, 12:51 PM

## 2023-04-11 NOTE — Telephone Encounter (Signed)
Pt would like to know when does he stop taking his Asprin?

## 2023-04-11 NOTE — Telephone Encounter (Signed)
now

## 2023-04-11 NOTE — Telephone Encounter (Signed)
Tried to call. No answer. Ask patient to call us back.

## 2023-04-12 ENCOUNTER — Encounter: Payer: PPO | Admitting: Orthopaedic Surgery

## 2023-04-15 ENCOUNTER — Encounter: Payer: Self-pay | Admitting: Physical Therapy

## 2023-04-15 ENCOUNTER — Ambulatory Visit: Payer: PPO | Admitting: Physical Therapy

## 2023-04-15 DIAGNOSIS — R262 Difficulty in walking, not elsewhere classified: Secondary | ICD-10-CM | POA: Diagnosis not present

## 2023-04-15 DIAGNOSIS — R600 Localized edema: Secondary | ICD-10-CM | POA: Diagnosis not present

## 2023-04-15 DIAGNOSIS — M6281 Muscle weakness (generalized): Secondary | ICD-10-CM

## 2023-04-15 DIAGNOSIS — M25562 Pain in left knee: Secondary | ICD-10-CM | POA: Diagnosis not present

## 2023-04-15 NOTE — Therapy (Signed)
OUTPATIENT PHYSICAL THERAPY LOWER EXTREMITY TREATMENT   Patient Name: Nicholas Rasmussen MRN: [id number] DOB:[birth year], 77 y.o., male Today's Date: 04/15/2023    END OF SESSION:  PT End of Session - 04/15/23 1304     Visit Number 11    Number of Visits 24    Date for PT Re-Evaluation 06/10/23    Progress Note Due on Visit 18    PT Start Time 1302    PT Stop Time 1341    PT Time Calculation (min) 39 min    Activity Tolerance Patient tolerated treatment well;No increased pain    Behavior During Therapy [facility] for tasks assessed/performed                      Past Medical History:  Diagnosis Date   Allergic rhinitis 05/07/2014   Arthritis    Diabetes mellitus without complication (HCC)    Dizziness 01/02/2016   GERD (gastroesophageal reflux disease)    Headache 03/13/2015   Kidney lesion 07/22/2020   right, followed by urology   Left flank pain 01/02/2016   Low back pain 06/06/2015   Pain of left side of body 08/13/2015   Post herpetic neuralgia    Routine general medical examination at a health care facility 05/07/2014   Seizure disorder (HCC) 05/07/2014   Seizures (HCC)    Last seizure in the 70s.   Shingles    Stye 05/07/2014   Past Surgical History:  Procedure Laterality Date   COLONOSCOPY     KNEE SURGERY Left    left miniscus   TOTAL KNEE ARTHROPLASTY Left 02/28/2023   Procedure: TOTAL KNEE ARTHROPLASTY;  Surgeon: Tarry Kos, MD;  Location: [facility] OR;  Service: Orthopedics;  Laterality: Left;   UPPER GASTROINTESTINAL ENDOSCOPY     uvala surgery     Patient Active Problem List   Diagnosis Date Noted   Status post total left knee replacement 02/28/2023   Renal mass, right; Bosniak 4 complex cyst; 1 cm 09/09/2022   Vasomotor rhinitis 03/03/2022   Acute medial meniscus tear of left knee 02/04/2022   Primary osteoarthritis of left knee 02/04/2022   Aortic root dilatation (HCC) 12/18/2021   Slow transit constipation 12/08/2021   Left-sided chest  wall pain 12/08/2021   Multiple pulmonary nodules 10/06/2021   Hyperlipidemia associated with type 2 diabetes mellitus (HCC) 08/19/2021   [name] 04/07/2021   Complex renal cyst 04/07/2021   Varicose veins of both lower extremities with pain 02/02/2019   Encounter for therapeutic drug level monitoring 09/19/2018   Type 2 diabetes mellitus with diabetic polyneuropathy, without long-term current use of insulin (HCC) 03/29/2018   Diabetic neuropathy (HCC) 03/29/2018   Chronic pain of left knee 03/23/2018   Coronary artery disease involving native coronary artery of native heart with angina pectoris (HCC) 08/03/2017   Post herpetic neuralgia 07/29/2016   Dizziness 01/02/2016   Chronic midline low back pain without sciatica 06/06/2015   Nonintractable episodic headache 03/13/2015   GERD (gastroesophageal reflux disease) 11/15/2014   Seizure disorder (HCC) 05/07/2014   Allergic rhinitis 05/07/2014    PCP: Anne Ng, NP  REFERRING PROVIDER: Tarry Kos, MD  REFERRING DIAG:  Diagnosis  M17.12 (ICD-10-CM) - Primary osteoarthritis of left knee    THERAPY DIAG:  Acute pain of left knee  Muscle weakness (generalized)  Difficulty in walking, not elsewhere classified  Localized edema  Rationale for Evaluation and Treatment: Rehabilitation  ONSET DATE: 02/28/23  SUBJECTIVE:  SUBJECTIVE STATEMENT:  Feeling OK today, nothing new. Still trying to walk around the house without my cane but still using it when I leave the house, would really like to get back to dancing. Strength is still my biggest concern, don't want my knee to buckle. Only pain is behind the knee now.   PERTINENT HISTORY: Pt s/p left TKA on 02/28/23.PMH: diabetes, HTN, CAD, seizures, OA and medial meniscus tear of left knee   PAIN:  NPRS scale:  3/10 now, up to 6/10 at worst  Pain location: posterior left knee Pain description: pressure/pressing pain   Aggravating factors: bending knee, sleeping, sit to stand Relieving factors: resting, taking pain meds, ice, straightening knee   PRECAUTIONS: None  WEIGHT BEARING RESTRICTIONS: No  FALLS:  Has patient fallen in last 6 months? No  LIVING ENVIRONMENT: Lives in: House/apartment Stairs: 1 flight on stairs, said he goes up and down sideways  OCCUPATION:  retired -- enjoys dancing,   PLOF: Independent  PATIENT GOALS:  get back to dancing   Next MD visit: November 15 @ 10:15  OBJECTIVE:   DIAGNOSTIC FINDINGS: FINDINGS: Left knee arthroplasty in expected alignment. No periprosthetic lucency or fracture. Recent postsurgical change includes air and edema in the soft tissues and joint space. Vascular calcifications posteriorly peer   IMPRESSION: Left knee arthroplasty without immediate postoperative complication.  PATIENT SURVEYS:  03/18/23: FOTO intake: 40 (Goal 59 is 16 visits) 04/04/2023: visit 8   58%  COGNITION: Overall cognitive status: WFL    SENSATION: WFL  EDEMA:  Circumferential: Left:   centimeters Rt: 44.5 centimeters   PALPATION: TTP around incision site and medial and lateral joint tenderness  LOWER EXTREMITY ROM:   ROM Left 03/15/23 supine Left 03/18/23 Left 03/25/23 Left Supine 03/28/23 Left  supine 04/01/23 Left 04/15/23 Seated   Hip flexion        Hip extension        Hip abduction        Hip adduction        Hip internal rotation        Hip external rotation        Knee flexion A: 70 P; 74 A:  92 AROM 102*, PROM 105* seated edge of mat table A: 106 P: 110 AROM 112* AROM 115*   Knee extension A: -4 P: 0 A: -3 AROM -3* supine heel prop  A: -4 P: -2 AROM -2* AROM 4* LAQ   Ankle dorsiflexion        Ankle plantarflexion        Ankle inversion        Ankle eversion         (Blank rows = not tested)  LOWER EXTREMITY MMT:  MMT Right 03/15/23 Left 03/15/23 Right 04/04/23 Left  04/04/23 Right 04/15/23 Left 04/15/23  Hip flexion 5  4      Hip extension        Hip abduction        Hip adduction        Hip internal rotation        Hip external rotation        Knee flexion 5 4   5  4+  Knee extension 5 3- Hand held dynameter 85.6# 80.8# Hand held dynameter 50.8# 49.8# LLE:RLE 61% 5 4-  Ankle dorsiflexion        Ankle plantarflexion        Ankle inversion        Ankle eversion         (  Blank rows = not tested)    FUNCTIONAL TESTS:  04/15/23: 5xSTS 12 seconds no UE support  04/04/2023:  5 time sit to stand: 26.1 seconds without UE support 03/15/23: 5 time sit to stand: 27.5 seconds c UE support  GAIT: 04/04/2023 Pt amb with cane >200' safely.   03/15/23 Distance walked: clinic distances Assistive device utilized: Environmental consultant - 2 wheeled Level of assistance: Modified independence Comments: antalgic gait with increased knee flexion and decreased heel strike                                                                                                                                                                        TODAY'S TREATMENT                                                                          DATE:  04/15/23  TherEx  Scifit bike Seat 13-->12 x10 minutes for w/u and tissue perfusion ROM and MMT check, goal update as able  Forward step ups 8 inch L LE x15  STS from standard chair 5# wt L hand x10  Forward lunges onto 8 inch box L LE x10  Forward step downs BUE support 6 inch box x10 LLE Shuttle LE press 137# x15  BLEs, 62# x15 L LE then x5 68#        04/11/2023: Therapeutic Exercise: Precor recumbent bike seat 11 level 1 5 min and level 3 for 3 min. PT recommended for apt bike build up to 30 min total. Initially 10 min 2 sets with timed rest weaning rest time down until 20 min straight, then add 1 min as able up to 30 min total. Pt verbalized understanding.  PT verbally educated pt on exercise program to include endurance, flexibility, strength & balance activities. Pt verbalized  understanding.  Leg press BLEs 131# 15 reps, single leg 62# 15 reps 2 sets for BLEs Tandem stance on foam beam 30 sec each with LLE in front & in back Stepping over hurdle with active knee flexion and stabilizing in step position 3 sec alternating LEs 10 reps ea 6" hurdle & 9" hurdle Slider to 3 cones (ant-lat, lat & post-lat) with stance LE flexion on outward motion and extension on inward motion 5 reps with ea LE.  Knee ext machine BLEs concentric & eccentric 20# 15 reps;  BLEs concentric & isometric / eccentric 5# 10 reps  Vaso 10 min medium compression 34* 10 min with elevation  to limit edema & pain with new exercises.     04/08/23  TherEx  Recumbent bike seat 14-->12 x8 minutes for w/u and tissue perfusion full rotations  LAQs 7# 2x10  Forward step ups 6 inch box x15 L LE  Forward step downs 4 inch box x12 L LE  Shuttle LE press 125# 2x20, L LE 62# 2x15     NMR  SLS with one foot on 6 inch box 3x30 seconds B solid surface  Standing on blue foam pad alternating toe taps to 4 inch box x20    04/04/2023: Therapeutic Exercise: Recumbent bike seat 11 LEs only level 1 for 8 min. Leg press BLEs 125# 15 reps 2 sets; LLE only 62# 10 reps 2 sets Stepping over hurdle with active knee flexion and hold step position for 5 sec alternating LEs - 10 reps over 6" hurdle and 10 reps over 9" hurdle PT discussed starting gym 1x/wk while still under PT care so can have guidance.  Recommendation to start with bike and leg press machine. Pt verbalized understanding.  LAQ 5# 10 reps 2 sets See objective data    PATIENT EDUCATION:  Education details: HEP, POC Person educated: Patient Education method: Explanation, Demonstration, Verbal cues, and Handouts Education comprehension: verbalized understanding, returned demonstration, and verbal cues required  HOME EXERCISE PROGRAM: Access Code: [email or web address] URL: [email or web address] Date: 03/23/2023 Prepared by: Vladimir Faster  Exercises - Seated Knee Flexion Extension AAROM with Overpressure  - 1 x daily - 7 x weekly - 3 sets - 10 reps - Seated Long Arc Quad  - 3-5 x daily - 7 x weekly - 2 sets - 10 reps - 3 seconds hold - Seated Heel Slide  - 3-4 x daily - 7 x weekly - 2 sets - 10 reps - 5 seconds hold - Supine Heel Slide with Strap  - 3-4 x daily - 7 x weekly - 2 sets - 10 reps - 5 seconds hold - Long Sitting Quad Set with Towel Roll Under Heel  - 3-4 x daily - 7 x weekly - 2 sets - 10 reps - Sit to Stand  - 3-4 x daily - 7 x weekly - 10 reps - Hooklying Hamstring Stretch with Strap  - 2 x daily - 7 x weekly - 3 sets - 10 reps - Gastroc Stretch on Step  - 2-3 x daily - 7 x weekly - 1 sets - 2-3 reps - 20-30 seconds hold - Seated Table Hamstring Stretch  - 2-3 x daily - 7 x weekly - 1 sets - 2-3 reps - 20-30 seconds hold - Supine Quadriceps Stretch with Strap on Table  - 2-3 x daily - 7 x weekly - 1 sets - 2-3 reps - 20-30 seconds hold  ASSESSMENT:  CLINICAL IMPRESSION:  Pt arrives today doing well, main concern is strength and knee stability needed to get back to dancing. Continued focus on functional strengthening and balance to work towards this goal. He is really making good progress, biggest impairment at this time is functional strength especially in quad strength surgical LE.   OBJECTIVE IMPAIRMENTS: decreased balance, decreased mobility, difficulty walking, decreased ROM, decreased strength, increased edema, and pain.   ACTIVITY LIMITATIONS: bending, standing, squatting, sleeping, stairs, and transfers  PARTICIPATION LIMITATIONS: driving and community activity  PERSONAL FACTORS: 3+ comorbidities: see pertinent history above  are also affecting patient's functional outcome.   REHAB POTENTIAL: Good  CLINICAL DECISION MAKING: Stable/uncomplicated  EVALUATION COMPLEXITY: Low   GOALS: Goals  reviewed with patient? Yes  SHORT TERM GOALS: (target date for Short term goals are 3 weeks  04/08/23)   1.  Patient will demonstrate independent use of home exercise program to maintain progress from in clinic treatments.  Goal status: MET 04/15/23  2. Pt will improve his 5 time sit to stand to </= 15 sec with UE support.  Goal status: MET 04/15/23  LONG TERM GOALS: (target dates for all long term goals are 12 weeks 06/10/23 )   1. Patient will demonstrate/report pain at worst less than or equal to 2/10 to facilitate minimal limitation in daily activity secondary to pain symptoms.  Goal status: Ongoing 04/11/2023   2. Patient will demonstrate independent use of home exercise program to facilitate ability to maintain/progress functional gains from skilled physical therapy services.  Goal status: Ongoing 04/11/2023   3. Patient will demonstrate FOTO outcome > or = 59% from baseline to indicate reduced disability due to condition.  Goal status: Ongoing 04/11/2023   4.  Patient will demonstrate left LE MMT 5/5 throughout to faciltiate usual transfers, stairs, squatting at [facility] for daily life.   Goal status: Ongoing 04/11/2023   5.  Patient will demonstrate stair navigation using reciprocal gait pattern using single hand rail.  Goal status: Ongoing 04/11/2023   6.  Pt will be able to demonstrate ball room maneuvers with pain </= 2/10.  Goal status: Ongoing 04/11/2023  7. Pt will improve his left knee flexion to >/= 115 degrees to improve functional mobility.   Goal Status: Ongoing 04/11/2023      PLAN:  PT FREQUENCY: 1-2x/week  PT DURATION: 10 weeks  PLANNED INTERVENTIONS: Can include 16109- PT Re-evaluation, 97110-Therapeutic exercises, 97530- Therapeutic activity, 97112- Neuromuscular re-education, 97535- Self Care, 97140- Manual therapy, [phone]- Gait training, [phone]- Orthotic Fit/training, [phone]- Canalith repositioning, [id number]- Aquatic Therapy, 97014- Electrical stimulation (unattended), [id number]- Electrical stimulation (manual), [id number]- Vasopneumatic device, [id number]-  Ultrasound, [id number]- Traction (mechanical), [id number]- Ionotophoresis 4mg /ml Dexamethasone, Patient/Family education, Balance training, Stair training, Taping, Dry Needling, Joint mobilization, Joint manipulation, Spinal manipulation, Spinal mobilization, Scar mobilization, Vestibular training, Visual/preceptual remediation/compensation, DME instructions, Cryotherapy, and Moist heat.  All performed as medically necessary.  All included unless contraindicated  PLAN FOR NEXT SESSION: Quad and surgical LE strength focus!  instruct in progressive exercises for functional activities especially quadriceps strengthening, vaso PRN    Nedra Hai, PT, DPT 04/15/23 1:43 PM

## 2023-04-18 DIAGNOSIS — E119 Type 2 diabetes mellitus without complications: Secondary | ICD-10-CM | POA: Diagnosis not present

## 2023-04-20 ENCOUNTER — Ambulatory Visit: Payer: PPO | Admitting: Physical Therapy

## 2023-04-20 ENCOUNTER — Encounter: Payer: Self-pay | Admitting: Physical Therapy

## 2023-04-20 DIAGNOSIS — R600 Localized edema: Secondary | ICD-10-CM

## 2023-04-20 DIAGNOSIS — R262 Difficulty in walking, not elsewhere classified: Secondary | ICD-10-CM

## 2023-04-20 DIAGNOSIS — M25562 Pain in left knee: Secondary | ICD-10-CM | POA: Diagnosis not present

## 2023-04-20 DIAGNOSIS — M6281 Muscle weakness (generalized): Secondary | ICD-10-CM

## 2023-04-20 NOTE — Therapy (Signed)
OUTPATIENT PHYSICAL THERAPY LOWER EXTREMITY TREATMENT   Patient Name: Nicholas Rasmussen MRN: [id number] DOB:09-08-1945, 77 y.o., male Today's Date: 04/20/2023    END OF SESSION:  PT End of Session - 04/20/23 1428     Visit Number 12    Number of Visits 24    Date for PT Re-Evaluation 06/10/23    Progress Note Due on Visit 18    PT Start Time 1428    PT Stop Time 1510    PT Time Calculation (min) 42 min    Activity Tolerance Patient tolerated treatment well;No increased pain    Behavior During Therapy [facility] for tasks assessed/performed                       Past Medical History:  Diagnosis Date   Allergic rhinitis 05/07/2014   Arthritis    Diabetes mellitus without complication (HCC)    Dizziness 01/02/2016   GERD (gastroesophageal reflux disease)    Headache 03/13/2015   Kidney lesion 07/22/2020   right, followed by urology   Left flank pain 01/02/2016   Low back pain 06/06/2015   Pain of left side of body 08/13/2015   Post herpetic neuralgia    Routine general medical examination at a health care facility 05/07/2014   Seizure disorder (HCC) 05/07/2014   Seizures (HCC)    Last seizure in the 70s.   Shingles    Stye 05/07/2014   Past Surgical History:  Procedure Laterality Date   COLONOSCOPY     KNEE SURGERY Left    left miniscus   TOTAL KNEE ARTHROPLASTY Left 02/28/2023   Procedure: TOTAL KNEE ARTHROPLASTY;  Surgeon: Tarry Kos, MD;  Location: [facility] OR;  Service: Orthopedics;  Laterality: Left;   UPPER GASTROINTESTINAL ENDOSCOPY     uvala surgery     Patient Active Problem List   Diagnosis Date Noted   Status post total left knee replacement 02/28/2023   Renal mass, right; Bosniak 4 complex cyst; 1 cm 09/09/2022   Vasomotor rhinitis 03/03/2022   Acute medial meniscus tear of left knee 02/04/2022   Primary osteoarthritis of left knee 02/04/2022   Aortic root dilatation (HCC) 12/18/2021   Slow transit constipation 12/08/2021   Left-sided chest  wall pain 12/08/2021   Multiple pulmonary nodules 10/06/2021   Hyperlipidemia associated with type 2 diabetes mellitus (HCC) 08/19/2021   [name] 04/07/2021   Complex renal cyst 04/07/2021   Varicose veins of both lower extremities with pain 02/02/2019   Encounter for therapeutic drug level monitoring 09/19/2018   Type 2 diabetes mellitus with diabetic polyneuropathy, without long-term current use of insulin (HCC) 03/29/2018   Diabetic neuropathy (HCC) 03/29/2018   Chronic pain of left knee 03/23/2018   Coronary artery disease involving native coronary artery of native heart with angina pectoris (HCC) 08/03/2017   Post herpetic neuralgia 07/29/2016   Dizziness 01/02/2016   Chronic midline low back pain without sciatica 06/06/2015   Nonintractable episodic headache 03/13/2015   GERD (gastroesophageal reflux disease) 11/15/2014   Seizure disorder (HCC) 05/07/2014   Allergic rhinitis 05/07/2014    PCP: Anne Ng, NP  REFERRING PROVIDER: Tarry Kos, MD  REFERRING DIAG:  Diagnosis  M17.12 (ICD-10-CM) - Primary osteoarthritis of left knee    THERAPY DIAG:  Acute pain of left knee  Muscle weakness (generalized)  Difficulty in walking, not elsewhere classified  Localized edema  Rationale for Evaluation and Treatment: Rehabilitation  ONSET DATE: 02/28/23  SUBJECTIVE:  SUBJECTIVE STATEMENT: He is going to [place] for ~5 days.    PERTINENT HISTORY: Pt s/p left TKA on 02/28/23.PMH: diabetes, HTN, CAD, seizures, OA and medial meniscus tear of left knee   PAIN:  NPRS scale:  2/10 now, and over last week 2/10 - 4/10 Pain location: posterior left knee Pain description: pressure/pressing pain  Aggravating factors: bending knee, sleeping, sit to stand Relieving factors: resting, taking pain meds, ice, straightening knee   PRECAUTIONS: None  WEIGHT BEARING RESTRICTIONS: No  FALLS:  Has patient fallen in last 6 months?  No  LIVING ENVIRONMENT: Lives in: House/apartment Stairs: 1 flight on stairs, said he goes up and down sideways  OCCUPATION:  retired -- enjoys dancing,   PLOF: Independent  PATIENT GOALS:  get back to dancing   Next MD visit: November 15 @ 10:15  OBJECTIVE:   DIAGNOSTIC FINDINGS: FINDINGS: Left knee arthroplasty in expected alignment. No periprosthetic lucency or fracture. Recent postsurgical change includes air and edema in the soft tissues and joint space. Vascular calcifications posteriorly peer   IMPRESSION: Left knee arthroplasty without immediate postoperative complication.  PATIENT SURVEYS:  03/18/23: FOTO intake: 40 (Goal 59 is 16 visits) 04/04/2023: visit 8   58%  COGNITION: Overall cognitive status: WFL    SENSATION: WFL  EDEMA:  Circumferential: Left:   centimeters Rt: 44.5 centimeters   PALPATION: TTP around incision site and medial and lateral joint tenderness  LOWER EXTREMITY ROM:   ROM Left 03/15/23 supine Left 03/18/23 Left 03/25/23 Left Supine 03/28/23 Left  supine 04/01/23 Left 04/15/23 Seated   Hip flexion        Hip extension        Hip abduction        Hip adduction        Hip internal rotation        Hip external rotation        Knee flexion A: 70 P; 74 A:  92 AROM 102*, PROM 105* seated edge of mat table A: 106 P: 110 AROM 112* AROM 115*   Knee extension A: -4 P: 0 A: -3 AROM -3* supine heel prop  A: -4 P: -2 AROM -2* AROM 4* LAQ   Ankle dorsiflexion        Ankle plantarflexion        Ankle inversion        Ankle eversion         (Blank rows = not tested)  LOWER EXTREMITY MMT:  MMT Right 03/15/23 Left 03/15/23 Right 04/04/23 Left  04/04/23 Right 04/15/23 Left 04/15/23  Hip flexion 5 4      Hip extension        Hip abduction        Hip adduction        Hip internal rotation        Hip external rotation        Knee flexion 5 4   5  4+  Knee extension 5 3- Hand held dynameter 85.6# 80.8# Hand held  dynameter 50.8# 49.8# LLE:RLE 61% 5 4-  Ankle dorsiflexion        Ankle plantarflexion        Ankle inversion        Ankle eversion         (Blank rows = not tested)    FUNCTIONAL TESTS:  04/15/23: 5xSTS 12 seconds no UE support  04/04/2023:  5 time sit to stand: 26.1 seconds without UE support 03/15/23: 5 time sit to stand: 27.5  seconds c UE support  GAIT: 04/04/2023 Pt amb with cane >200' safely.   03/15/23 Distance walked: clinic distances Assistive device utilized: Environmental consultant - 2 wheeled Level of assistance: Modified independence Comments: antalgic gait with increased knee flexion and decreased heel strike                                                                                                                                                                        TODAY'S TREATMENT                                                                          DATE: 04/20/2023 Therapeutic Exercise: Precor recumbent bike seat 11 level 3 for 8 min. Leg press BLEs 137# 15 reps, single leg 62# 15 reps 2 sets for BLEs Slider to 3 cones (ant-lat, lat & post-lat) with stance LE flexion on outward motion and extension on inward motion 5 reps with ea LE.  Stepping over hurdle with active knee flexion and stabilizing in step position 3 sec alternating LEs 15 reps 9" hurdle Side stepping right & left over 9" hurdle 10 reps.  Therapeutic Activities: PT recommended ankle exercise of alphabet every 30 min and stopping to walk for 2+ minutes every 2 hours during trip to minimize risk of DVT. Pt verbalized understanding.  Pt's goal is to return to dancing.  PT demo & verbal cues including use of dance socks on forefoot to reduce torsion on knees. Pt able to braid and turning 90*/180* with no loss of balance. PT recommended dancing with wife. Take breaks and build up tolerance. Start with slower songs and not complicated steps yet. Pt verbalized understanding.  Stairs alternating pattern 11  steps: 1st flight BUE rails; 2nd flight right rail and 3rd flight left rail.  Pt verbalized understanding as HEP. PT demo & verbal cues on walking carrying cane so enables natural arm swing. Work to increase distance. If notices increase in knee pain, limping or knee instability use the cane.  Pt verbalized understanding.     TREATMENT  DATE:  04/15/23  TherEx  Scifit bike Seat 13-->12 x10 minutes for w/u and tissue perfusion ROM and MMT check, goal update as able  Forward step ups 8 inch L LE x15  STS from standard chair 5# wt L hand x10  Forward lunges onto 8 inch box L LE x10  Forward step downs BUE support 6 inch box x10 LLE Shuttle LE press 137# x15  BLEs, 62# x15 L LE then x5 68#        04/11/2023: Therapeutic Exercise: Precor recumbent bike seat 11 level 1 5 min and level 3 for 3 min. PT recommended for apt bike build up to 30 min total. Initially 10 min 2 sets with timed rest weaning rest time down until 20 min straight, then add 1 min as able up to 30 min total. Pt verbalized understanding.  PT verbally educated pt on exercise program to include endurance, flexibility, strength & balance activities. Pt verbalized understanding.  Leg press BLEs 131# 15 reps, single leg 62# 15 reps 2 sets for BLEs Tandem stance on foam beam 30 sec each with LLE in front & in back Stepping over hurdle with active knee flexion and stabilizing in step position 3 sec alternating LEs 10 reps ea 6" hurdle & 9" hurdle Slider to 3 cones (ant-lat, lat & post-lat) with stance LE flexion on outward motion and extension on inward motion 5 reps with ea LE.  Knee ext machine BLEs concentric & eccentric 20# 15 reps;  BLEs concentric & isometric / eccentric 5# 10 reps  Vaso 10 min medium compression 34* 10 min with elevation to limit edema & pain with new exercises.     PATIENT EDUCATION:  Education details: HEP, POC Person  educated: Patient Education method: Programmer, multimedia, Demonstration, Verbal cues, and Handouts Education comprehension: verbalized understanding, returned demonstration, and verbal cues required  HOME EXERCISE PROGRAM: Access Code: [email or web address] URL: [email or web address] Date: 03/23/2023 Prepared by: Vladimir Faster  Exercises - Seated Knee Flexion Extension AAROM with Overpressure  - 1 x daily - 7 x weekly - 3 sets - 10 reps - Seated Long Arc Quad  - 3-5 x daily - 7 x weekly - 2 sets - 10 reps - 3 seconds hold - Seated Heel Slide  - 3-4 x daily - 7 x weekly - 2 sets - 10 reps - 5 seconds hold - Supine Heel Slide with Strap  - 3-4 x daily - 7 x weekly - 2 sets - 10 reps - 5 seconds hold - Long Sitting Quad Set with Towel Roll Under Heel  - 3-4 x daily - 7 x weekly - 2 sets - 10 reps - Sit to Stand  - 3-4 x daily - 7 x weekly - 10 reps - Hooklying Hamstring Stretch with Strap  - 2 x daily - 7 x weekly - 3 sets - 10 reps - Gastroc Stretch on Step  - 2-3 x daily - 7 x weekly - 1 sets - 2-3 reps - 20-30 seconds hold - Seated Table Hamstring Stretch  - 2-3 x daily - 7 x weekly - 1 sets - 2-3 reps - 20-30 seconds hold - Supine Quadriceps Stretch with Strap on Table  - 2-3 x daily - 7 x weekly - 1 sets - 2-3 reps - 20-30 seconds hold  ASSESSMENT:  CLINICAL IMPRESSION: PT worked on dancing which is patient's goal for mobility.  He appears safe to start with slow dance moves and increase time.  Pt improved function on stairs  with PT education.  He continues to improve balance & function.  He continues to benefit from skilled PT.  OBJECTIVE IMPAIRMENTS: decreased balance, decreased mobility, difficulty walking, decreased ROM, decreased strength, increased edema, and pain.   ACTIVITY LIMITATIONS: bending, standing, squatting, sleeping, stairs, and transfers  PARTICIPATION LIMITATIONS: driving and community activity  PERSONAL FACTORS: 3+ comorbidities: see pertinent history above  are also  affecting patient's functional outcome.   REHAB POTENTIAL: Good  CLINICAL DECISION MAKING: Stable/uncomplicated  EVALUATION COMPLEXITY: Low   GOALS: Goals reviewed with patient? Yes  SHORT TERM GOALS: (target date for Short term goals are 3 weeks 04/08/23)   1.  Patient will demonstrate independent use of home exercise program to maintain progress from in clinic treatments.  Goal status: MET 04/15/23  2. Pt will improve his 5 time sit to stand to </= 15 sec with UE support.  Goal status: MET 04/15/23  LONG TERM GOALS: (target dates for all long term goals are 12 weeks 06/10/23 )   1. Patient will demonstrate/report pain at worst less than or equal to 2/10 to facilitate minimal limitation in daily activity secondary to pain symptoms.  Goal status: Ongoing 04/11/2023   2. Patient will demonstrate independent use of home exercise program to facilitate ability to maintain/progress functional gains from skilled physical therapy services.  Goal status: Ongoing 04/11/2023   3. Patient will demonstrate FOTO outcome > or = 59% from baseline to indicate reduced disability due to condition.  Goal status: Ongoing 04/11/2023   4.  Patient will demonstrate left LE MMT 5/5 throughout to faciltiate usual transfers, stairs, squatting at [facility] for daily life.   Goal status: Ongoing 04/11/2023   5.  Patient will demonstrate stair navigation using reciprocal gait pattern using single hand rail.  Goal status: Ongoing 04/11/2023   6.  Pt will be able to demonstrate ball room maneuvers with pain </= 2/10.  Goal status: Ongoing 04/11/2023  7. Pt will improve his left knee flexion to >/= 115 degrees to improve functional mobility.   Goal Status: Ongoing 04/11/2023      PLAN:  PT FREQUENCY: 1-2x/week  PT DURATION: 10 weeks  PLANNED INTERVENTIONS: Can include 16109- PT Re-evaluation, 97110-Therapeutic exercises, 97530- Therapeutic activity, 97112- Neuromuscular re-education, 97535- Self  Care, 97140- Manual therapy, [phone]- Gait training, [phone]- Orthotic Fit/training, [phone]- Canalith repositioning, [id number]- Aquatic Therapy, 97014- Electrical stimulation (unattended), [id number]- Electrical stimulation (manual), [id number]- Vasopneumatic device, [id number]- Ultrasound, [id number]- Traction (mechanical), [id number]- Ionotophoresis 4mg /ml Dexamethasone, Patient/Family education, Balance training, Stair training, Taping, Dry Needling, Joint mobilization, Joint manipulation, Spinal manipulation, Spinal mobilization, Scar mobilization, Vestibular training, Visual/preceptual remediation/compensation, DME instructions, Cryotherapy, and Moist heat.  All performed as medically necessary.  All included unless contraindicated  PLAN FOR NEXT SESSION:  continue exercises with functional focus and quad strength,     Vladimir Faster, PT, DPT 04/20/2023, 4:08 PM

## 2023-05-03 ENCOUNTER — Ambulatory Visit: Payer: PPO | Admitting: Physical Therapy

## 2023-05-03 ENCOUNTER — Encounter: Payer: Self-pay | Admitting: Physical Therapy

## 2023-05-03 DIAGNOSIS — R262 Difficulty in walking, not elsewhere classified: Secondary | ICD-10-CM | POA: Diagnosis not present

## 2023-05-03 DIAGNOSIS — M6281 Muscle weakness (generalized): Secondary | ICD-10-CM

## 2023-05-03 DIAGNOSIS — M25562 Pain in left knee: Secondary | ICD-10-CM | POA: Diagnosis not present

## 2023-05-03 DIAGNOSIS — R600 Localized edema: Secondary | ICD-10-CM

## 2023-05-03 NOTE — Therapy (Signed)
OUTPATIENT PHYSICAL THERAPY LOWER EXTREMITY TREATMENT   Patient Name: Nicholas Rasmussen MRN: [id number] DOB:05/10/1946, 77 y.o., male Today's Date: 05/03/2023    END OF SESSION:  PT End of Session - 05/03/23 1349     Visit Number 13    Number of Visits 24    Date for PT Re-Evaluation 06/10/23    Progress Note Due on Visit 18    PT Start Time 1346    PT Stop Time 1425    PT Time Calculation (min) 39 min    Activity Tolerance Patient tolerated treatment well;No increased pain    Behavior During Therapy [facility] for tasks assessed/performed                        Past Medical History:  Diagnosis Date   Allergic rhinitis 05/07/2014   Arthritis    Diabetes mellitus without complication (HCC)    Dizziness 01/02/2016   GERD (gastroesophageal reflux disease)    Headache 03/13/2015   Kidney lesion 07/22/2020   right, followed by urology   Left flank pain 01/02/2016   Low back pain 06/06/2015   Pain of left side of body 08/13/2015   Post herpetic neuralgia    Routine general medical examination at a health care facility 05/07/2014   Seizure disorder (HCC) 05/07/2014   Seizures (HCC)    Last seizure in the 70s.   Shingles    Stye 05/07/2014   Past Surgical History:  Procedure Laterality Date   COLONOSCOPY     KNEE SURGERY Left    left miniscus   TOTAL KNEE ARTHROPLASTY Left 02/28/2023   Procedure: TOTAL KNEE ARTHROPLASTY;  Surgeon: Tarry Kos, MD;  Location: [facility] OR;  Service: Orthopedics;  Laterality: Left;   UPPER GASTROINTESTINAL ENDOSCOPY     uvala surgery     Patient Active Problem List   Diagnosis Date Noted   Status post total left knee replacement 02/28/2023   Renal mass, right; Bosniak 4 complex cyst; 1 cm 09/09/2022   Vasomotor rhinitis 03/03/2022   Acute medial meniscus tear of left knee 02/04/2022   Primary osteoarthritis of left knee 02/04/2022   Aortic root dilatation (HCC) 12/18/2021   Slow transit constipation 12/08/2021   Left-sided chest  wall pain 12/08/2021   Multiple pulmonary nodules 10/06/2021   Hyperlipidemia associated with type 2 diabetes mellitus (HCC) 08/19/2021   [name] 04/07/2021   Complex renal cyst 04/07/2021   Varicose veins of both lower extremities with pain 02/02/2019   Encounter for therapeutic drug level monitoring 09/19/2018   Type 2 diabetes mellitus with diabetic polyneuropathy, without long-term current use of insulin (HCC) 03/29/2018   Diabetic neuropathy (HCC) 03/29/2018   Chronic pain of left knee 03/23/2018   Coronary artery disease involving native coronary artery of native heart with angina pectoris (HCC) 08/03/2017   Post herpetic neuralgia 07/29/2016   Dizziness 01/02/2016   Chronic midline low back pain without sciatica 06/06/2015   Nonintractable episodic headache 03/13/2015   GERD (gastroesophageal reflux disease) 11/15/2014   Seizure disorder (HCC) 05/07/2014   Allergic rhinitis 05/07/2014    PCP: Anne Ng, NP  REFERRING PROVIDER: Tarry Kos, MD  REFERRING DIAG:  Diagnosis  M17.12 (ICD-10-CM) - Primary osteoarthritis of left knee    THERAPY DIAG:  Acute pain of left knee  Muscle weakness (generalized)  Difficulty in walking, not elsewhere classified  Localized edema  Rationale for Evaluation and Treatment: Rehabilitation  ONSET DATE: 02/28/23  SUBJECTIVE:   SUBJECTIVE STATEMENT: His trip to [place] without issues. He reports no issues with any activities.  He was able dance a bit with no motions causing pain.      PERTINENT HISTORY: Pt s/p left TKA on 02/28/23.PMH: diabetes, HTN, CAD, seizures, OA and medial meniscus tear of left knee   PAIN:  NPRS scale: 2.5/10 now, and over last week 1/10 - 3.5/10 Pain location: posterior left knee Pain description: pressure/pressing pain  Aggravating factors: bending knee, sleeping, sit to stand Relieving factors: resting, taking pain meds, ice, straightening knee    PRECAUTIONS: None  WEIGHT BEARING RESTRICTIONS: No  FALLS:  Has patient fallen in last 6 months? No  LIVING ENVIRONMENT: Lives in: House/apartment Stairs: 1 flight on stairs, said he goes up and down sideways  OCCUPATION:  retired -- enjoys dancing,   PLOF: Independent  PATIENT GOALS:  get back to dancing   Next MD visit: November 15 @ 10:15  OBJECTIVE:   DIAGNOSTIC FINDINGS: FINDINGS: Left knee arthroplasty in expected alignment. No periprosthetic lucency or fracture. Recent postsurgical change includes air and edema in the soft tissues and joint space. Vascular calcifications posteriorly peer   IMPRESSION: Left knee arthroplasty without immediate postoperative complication.  PATIENT SURVEYS:  03/18/23: FOTO intake: 40 (Goal 59 is 16 visits) 04/04/2023: visit 8   58%  COGNITION: Overall cognitive status: WFL    SENSATION: WFL  EDEMA:  Circumferential: Left:   centimeters Rt: 44.5 centimeters   PALPATION: TTP around incision site and medial and lateral joint tenderness  LOWER EXTREMITY ROM:   ROM Left 03/15/23 supine Left 03/18/23 Left 03/25/23 Left Supine 03/28/23 Left  supine 04/01/23 Left 04/15/23 Seated   Hip flexion        Hip extension        Hip abduction        Hip adduction        Hip internal rotation        Hip external rotation        Knee flexion A: 70 P; 74 A:  92 AROM 102*, PROM 105* seated edge of mat table A: 106 P: 110 AROM 112* AROM 115*   Knee extension A: -4 P: 0 A: -3 AROM -3* supine heel prop  A: -4 P: -2 AROM -2* AROM 4* LAQ   Ankle dorsiflexion        Ankle plantarflexion        Ankle inversion        Ankle eversion         (Blank rows = not tested)  LOWER EXTREMITY MMT:  MMT Right 03/15/23 Left 03/15/23 Right 04/04/23 Left  04/04/23 Right 04/15/23 Left 04/15/23  Hip flexion 5 4      Hip extension        Hip abduction        Hip adduction        Hip internal rotation        Hip external rotation         Knee flexion 5 4   5  4+  Knee extension 5 3- Hand held dynameter 85.6# 80.8# Hand held dynameter 50.8# 49.8# LLE:RLE 61% 5 4-  Ankle dorsiflexion        Ankle plantarflexion        Ankle inversion        Ankle eversion         (Blank rows = not tested)    FUNCTIONAL TESTS:  04/15/23: 5xSTS 12 seconds no UE  support  04/04/2023:  5 time sit to stand: 26.1 seconds without UE support 03/15/23: 5 time sit to stand: 27.5 seconds c UE support  GAIT: 05/03/2023: Gait velocity self-selected pace 3.94 ft/sec and fast pace 6.09 ft/sec Pt neg ramp & curb with no knee instability independently.    04/04/2023 Pt amb with cane >200' safely.   03/15/23 Distance walked: clinic distances Assistive device utilized: Environmental consultant - 2 wheeled Level of assistance: Modified independence Comments: antalgic gait with increased knee flexion and decreased heel strike                                                                                                                                                                        TODAY'S TREATMENT                                                                          DATE: 05/03/2023 Therapeutic Exercise: Precor recumbent bike seat 11 level 5 for 8 min. Treadmill 2.3 mph for 2 min, 2.5 mph for 1 min, 2.3 mph for 2 min.  PT verbal & demo cues on use of Treadmill. Pt verbalized understanding. Gastroc stretch on step heel depression 30 sec hold 3 reps and heel raises 10 reps 2 sets (1 set bw each stretch) Hamstring stretch long sit with strap DF 30 sec hold 3 reps and hamstring curl seated with blue theraband 10 reps 2 sets (1 set bw each stretch) Quad stretch supine hooklying with strap knee flexion 30 sec hold 3 reps and LAQ with LLE over edge of bed 10 reps 2 sets (1 set bw each stretch) Y-reach with single UE support on chair back support 5 reps on ea LE    TREATMENT                                                                           DATE: 04/20/2023 Therapeutic Exercise: Precor recumbent bike seat 11 level 3 for 8 min. Leg press BLEs 137# 15 reps, single leg 62# 15 reps 2 sets for BLEs Slider to 3 cones (ant-lat, lat & post-lat) with stance LE flexion on outward motion and extension on inward motion 5 reps with ea  LE.  Stepping over hurdle with active knee flexion and stabilizing in step position 3 sec alternating LEs 15 reps 9" hurdle Side stepping right & left over 9" hurdle 10 reps.  Therapeutic Activities: PT recommended ankle exercise of alphabet every 30 min and stopping to walk for 2+ minutes every 2 hours during trip to minimize risk of DVT. Pt verbalized understanding.  Pt's goal is to return to dancing.  PT demo & verbal cues including use of dance socks on forefoot to reduce torsion on knees. Pt able to braid and turning 90*/180* with no loss of balance. PT recommended dancing with wife. Take breaks and build up tolerance. Start with slower songs and not complicated steps yet. Pt verbalized understanding.  Stairs alternating pattern 11 steps: 1st flight BUE rails; 2nd flight right rail and 3rd flight left rail.  Pt verbalized understanding as HEP. PT demo & verbal cues on walking carrying cane so enables natural arm swing. Work to increase distance. If notices increase in knee pain, limping or knee instability use the cane.  Pt verbalized understanding.     TREATMENT                                                                          DATE:  04/15/23  TherEx  Scifit bike Seat 13-->12 x10 minutes for w/u and tissue perfusion ROM and MMT check, goal update as able  Forward step ups 8 inch L LE x15  STS from standard chair 5# wt L hand x10  Forward lunges onto 8 inch box L LE x10  Forward step downs BUE support 6 inch box x10 LLE Shuttle LE press 137# x15  BLEs, 62# x15 L LE then x5 68#       PATIENT EDUCATION:  Education details: HEP, POC Person educated: Patient Education method: Programmer, multimedia,  Demonstration, Verbal cues, and Handouts Education comprehension: verbalized understanding, returned demonstration, and verbal cues required  HOME EXERCISE PROGRAM: Access Code: [email or web address] URL: [email or web address] Date: 03/23/2023 Prepared by: Vladimir Faster  Exercises - Seated Knee Flexion Extension AAROM with Overpressure  - 1 x daily - 7 x weekly - 3 sets - 10 reps - Seated Long Arc Quad  - 3-5 x daily - 7 x weekly - 2 sets - 10 reps - 3 seconds hold - Seated Heel Slide  - 3-4 x daily - 7 x weekly - 2 sets - 10 reps - 5 seconds hold - Supine Heel Slide with Strap  - 3-4 x daily - 7 x weekly - 2 sets - 10 reps - 5 seconds hold - Long Sitting Quad Set with Towel Roll Under Heel  - 3-4 x daily - 7 x weekly - 2 sets - 10 reps - Sit to Stand  - 3-4 x daily - 7 x weekly - 10 reps - Hooklying Hamstring Stretch with Strap  - 2 x daily - 7 x weekly - 3 sets - 10 reps - Gastroc Stretch on Step  - 2-3 x daily - 7 x weekly - 1 sets - 2-3 reps - 20-30 seconds hold - Seated Table Hamstring Stretch  - 2-3 x daily - 7 x weekly - 1 sets - 2-3 reps - 20-30 seconds hold -  Supine Quadriceps Stretch with Strap on Table  - 2-3 x daily - 7 x weekly - 1 sets - 2-3 reps - 20-30 seconds hold  ASSESSMENT:  CLINICAL IMPRESSION: Patient is reporting increased activity with less pain increases.  PT reviewed muscle stretches as should help with pain that he describes still present.    He continues to benefit from skilled PT.  OBJECTIVE IMPAIRMENTS: decreased balance, decreased mobility, difficulty walking, decreased ROM, decreased strength, increased edema, and pain.   ACTIVITY LIMITATIONS: bending, standing, squatting, sleeping, stairs, and transfers  PARTICIPATION LIMITATIONS: driving and community activity  PERSONAL FACTORS: 3+ comorbidities: see pertinent history above  are also affecting patient's functional outcome.   REHAB POTENTIAL: Good  CLINICAL DECISION MAKING:  Stable/uncomplicated  EVALUATION COMPLEXITY: Low   GOALS: Goals reviewed with patient? Yes  SHORT TERM GOALS: (target date for Short term goals are 3 weeks 04/08/23)   1.  Patient will demonstrate independent use of home exercise program to maintain progress from in clinic treatments.  Goal status: MET 04/15/23  2. Pt will improve his 5 time sit to stand to </= 15 sec with UE support.  Goal status: MET 04/15/23  LONG TERM GOALS: (target dates for all long term goals are 12 weeks 06/10/23 )   1. Patient will demonstrate/report pain at worst less than or equal to 2/10 to facilitate minimal limitation in daily activity secondary to pain symptoms.  Goal status: Ongoing  05/03/2023   2. Patient will demonstrate independent use of home exercise program to facilitate ability to maintain/progress functional gains from skilled physical therapy services.  Goal status: Ongoing  05/03/2023   3. Patient will demonstrate FOTO outcome > or = 59% from baseline to indicate reduced disability due to condition.  Goal status: Ongoing  05/03/2023   4.  Patient will demonstrate left LE MMT 5/5 throughout to faciltiate usual transfers, stairs, squatting at [facility] for daily life.   Goal status: Ongoing  05/03/2023   5.  Patient will demonstrate stair navigation using reciprocal gait pattern using single hand rail.  Goal status: Ongoing 05/03/2023   6.  Pt will be able to demonstrate ball room maneuvers with pain </= 2/10.  Goal status: Ongoing  05/03/2023  7. Pt will improve his left knee flexion to >/= 115 degrees to improve functional mobility.   Goal Status: Ongoing 05/03/2023      PLAN:  PT FREQUENCY: 1-2x/week  PT DURATION: 10 weeks  PLANNED INTERVENTIONS: Can include 16109- PT Re-evaluation, 97110-Therapeutic exercises, 97530- Therapeutic activity, 97112- Neuromuscular re-education, 97535- Self Care, 97140- Manual therapy, [phone]- Gait training, [phone]- Orthotic Fit/training, [phone]-  Canalith repositioning, [id number]- Aquatic Therapy, 97014- Electrical stimulation (unattended), [id number]- Electrical stimulation (manual), [id number]- Vasopneumatic device, [id number]- Ultrasound, [id number]- Traction (mechanical), [id number]- Ionotophoresis 4mg /ml Dexamethasone, Patient/Family education, Balance training, Stair training, Taping, Dry Needling, Joint mobilization, Joint manipulation, Spinal manipulation, Spinal mobilization, Scar mobilization, Vestibular training, Visual/preceptual remediation/compensation, DME instructions, Cryotherapy, and Moist heat.  All performed as medically necessary.  All included unless contraindicated  PLAN FOR NEXT SESSION:  assess LTGs for possible discharge    Vladimir Faster, PT, DPT 05/03/2023, 4:11 PM

## 2023-05-04 DIAGNOSIS — J439 Emphysema, unspecified: Secondary | ICD-10-CM | POA: Diagnosis not present

## 2023-05-04 DIAGNOSIS — R911 Solitary pulmonary nodule: Secondary | ICD-10-CM | POA: Diagnosis not present

## 2023-05-04 DIAGNOSIS — C641 Malignant neoplasm of right kidney, except renal pelvis: Secondary | ICD-10-CM | POA: Diagnosis not present

## 2023-05-04 DIAGNOSIS — D49511 Neoplasm of unspecified behavior of right kidney: Secondary | ICD-10-CM | POA: Diagnosis not present

## 2023-05-05 ENCOUNTER — Ambulatory Visit
Admission: RE | Admit: 2023-05-05 | Discharge: 2023-05-05 | Disposition: A | Discharge status: Home / Self Care | Payer: PPO | Source: Ambulatory Visit | Attending: Urology | Admitting: Urology

## 2023-05-05 DIAGNOSIS — D49511 Neoplasm of unspecified behavior of right kidney: Secondary | ICD-10-CM

## 2023-05-05 DIAGNOSIS — N2889 Other specified disorders of kidney and ureter: Secondary | ICD-10-CM | POA: Diagnosis not present

## 2023-05-05 MED ORDER — GADOPICLENOL 0.5 MMOL/ML IV SOLN
10.0000 mL | Freq: Once | INTRAVENOUS | Status: AC | PRN
  Administered 2023-05-05: 10 mL via INTRAVENOUS

## 2023-05-10 ENCOUNTER — Ambulatory Visit: Payer: PPO | Admitting: Physical Therapy

## 2023-05-10 ENCOUNTER — Encounter: Payer: Self-pay | Admitting: Physical Therapy

## 2023-05-10 DIAGNOSIS — M6281 Muscle weakness (generalized): Secondary | ICD-10-CM | POA: Diagnosis not present

## 2023-05-10 DIAGNOSIS — M25562 Pain in left knee: Secondary | ICD-10-CM

## 2023-05-10 DIAGNOSIS — R600 Localized edema: Secondary | ICD-10-CM

## 2023-05-10 DIAGNOSIS — R262 Difficulty in walking, not elsewhere classified: Secondary | ICD-10-CM

## 2023-05-10 NOTE — Therapy (Addendum)
OUTPATIENT PHYSICAL THERAPY LOWER EXTREMITY TREATMENT/Discharge PHYSICAL THERAPY DISCHARGE SUMMARY  Visits from Start of Care: 14  Current functional level related to goals / functional outcomes: See below   Remaining deficits: See below   Education / Equipment: HEP  Plan:  Patient goals were met. Patient is being discharged due to meeting PT goals.      Patient Name: Nicholas Rasmussen MRN: [id number] DOB:[birth year], 77 y.o., male Today's Date: 05/10/2023    END OF SESSION:  PT End of Session - 05/10/23 1348     Visit Number 14    Number of Visits 24    Date for PT Re-Evaluation 06/10/23    Progress Note Due on Visit 18    PT Start Time 1345    PT Stop Time 1410    PT Time Calculation (min) 25 min    Activity Tolerance Patient tolerated treatment well;No increased pain    Behavior During Therapy [facility] for tasks assessed/performed                         Past Medical History:  Diagnosis Date   Allergic rhinitis 05/07/2014   Arthritis    Diabetes mellitus without complication (HCC)    Dizziness 01/02/2016   GERD (gastroesophageal reflux disease)    Headache 03/13/2015   Kidney lesion 07/22/2020   right, followed by urology   Left flank pain 01/02/2016   Low back pain 06/06/2015   Pain of left side of body 08/13/2015   Post herpetic neuralgia    Routine general medical examination at a health care facility 05/07/2014   Seizure disorder (HCC) 05/07/2014   Seizures (HCC)    Last seizure in the 70s.   Shingles    Stye 05/07/2014   Past Surgical History:  Procedure Laterality Date   COLONOSCOPY     KNEE SURGERY Left    left miniscus   TOTAL KNEE ARTHROPLASTY Left 02/28/2023   Procedure: TOTAL KNEE ARTHROPLASTY;  Surgeon: Tarry Kos, MD;  Location: [facility] OR;  Service: Orthopedics;  Laterality: Left;   UPPER GASTROINTESTINAL ENDOSCOPY     uvala surgery     Patient Active Problem List   Diagnosis Date Noted   Status post total left knee  replacement 02/28/2023   Renal mass, right; Bosniak 4 complex cyst; 1 cm 09/09/2022   Vasomotor rhinitis 03/03/2022   Acute medial meniscus tear of left knee 02/04/2022   Primary osteoarthritis of left knee 02/04/2022   Aortic root dilatation (HCC) 12/18/2021   Slow transit constipation 12/08/2021   Left-sided chest wall pain 12/08/2021   Multiple pulmonary nodules 10/06/2021   Hyperlipidemia associated with type 2 diabetes mellitus (HCC) 08/19/2021   [name] 04/07/2021   Complex renal cyst 04/07/2021   Varicose veins of both lower extremities with pain 02/02/2019   Encounter for therapeutic drug level monitoring 09/19/2018   Type 2 diabetes mellitus with diabetic polyneuropathy, without long-term current use of insulin (HCC) 03/29/2018   Diabetic neuropathy (HCC) 03/29/2018   Chronic pain of left knee 03/23/2018   Coronary artery disease involving native coronary artery of native heart with angina pectoris (HCC) 08/03/2017   Post herpetic neuralgia 07/29/2016   Dizziness 01/02/2016   Chronic midline low back pain without sciatica 06/06/2015   Nonintractable episodic headache 03/13/2015   GERD (gastroesophageal reflux disease) 11/15/2014   Seizure disorder (HCC) 05/07/2014   Allergic rhinitis 05/07/2014    PCP: Anne Ng, NP  REFERRING PROVIDER:  Tarry Kos, MD  REFERRING DIAG:  Diagnosis  M17.12 (ICD-10-CM) - Primary osteoarthritis of left knee    THERAPY DIAG:  Acute pain of left knee  Muscle weakness (generalized)  Difficulty in walking, not elsewhere classified  Localized edema  Rationale for Evaluation and Treatment: Rehabilitation  ONSET DATE: 02/28/23  SUBJECTIVE:   SUBJECTIVE STATEMENT: He feels ready to DC today. He has no complaints.  PERTINENT HISTORY: Pt s/p left TKA on 02/28/23.PMH: diabetes, HTN, CAD, seizures, OA and medial meniscus tear of left knee   PAIN:  NPRS scale: 0 today  Pain  location: posterior left knee Pain description: pressure/pressing pain  Aggravating factors: bending knee, sleeping, sit to stand Relieving factors: resting, taking pain meds, ice, straightening knee   PRECAUTIONS: None  WEIGHT BEARING RESTRICTIONS: No  FALLS:  Has patient fallen in last 6 months? No  LIVING ENVIRONMENT: Lives in: House/apartment Stairs: 1 flight on stairs, said he goes up and down sideways  OCCUPATION:  retired -- enjoys dancing,   PLOF: Independent  PATIENT GOALS:  get back to dancing   Next MD visit: November 15 @ 10:15  OBJECTIVE:   DIAGNOSTIC FINDINGS: FINDINGS: Left knee arthroplasty in expected alignment. No periprosthetic lucency or fracture. Recent postsurgical change includes air and edema in the soft tissues and joint space. Vascular calcifications posteriorly peer   IMPRESSION: Left knee arthroplasty without immediate postoperative complication.  PATIENT SURVEYS:  03/18/23: FOTO intake: 40 (Goal 59 is 16 visits) 04/04/2023: visit 8   58% 05/10/2023: visit 14 77%%  COGNITION: Overall cognitive status: WFL    SENSATION: WFL  EDEMA:  Circumferential: Left:   centimeters Rt: 44.5 centimeters   PALPATION: TTP around incision site and medial and lateral joint tenderness  LOWER EXTREMITY ROM:   ROM Left 03/15/23 supine Left 03/18/23 Left 03/25/23 Left Supine 03/28/23 Left  supine 04/01/23 Left 04/15/23 Seated  Left 05/10/23  Hip flexion         Hip extension         Hip abduction         Hip adduction         Hip internal rotation         Hip external rotation         Knee flexion A: 70 P; 74 A:  92 AROM 102*, PROM 105* seated edge of mat table A: 106 P: 110 AROM 112* AROM 115*  AROM 120  Knee extension A: -4 P: 0 A: -3 AROM -3* supine heel prop  A: -4 P: -2 AROM -2* AROM 4* LAQ  AROM 0  Ankle dorsiflexion         Ankle plantarflexion         Ankle inversion         Ankle eversion          (Blank rows = not  tested)  LOWER EXTREMITY MMT:  MMT Right 03/15/23 Left 03/15/23 Right 04/04/23 Left  04/04/23 Right 04/15/23 Left 04/15/23 Left 05/10/23  Hip flexion 5 4     5   Hip extension         Hip abduction       5  Hip adduction         Hip internal rotation         Hip external rotation         Knee flexion 5 4   5  4+ 5  Knee extension 5 3- Hand held dynameter 85.6# 80.8# Hand held dynameter 50.8# 49.8#  LLE:RLE 61% 5 4- 5  Ankle dorsiflexion         Ankle plantarflexion         Ankle inversion         Ankle eversion          (Blank rows = not tested)    FUNCTIONAL TESTS:  04/15/23: 5xSTS 12 seconds no UE support  04/04/2023:  5 time sit to stand: 26.1 seconds without UE support 03/15/23: 5 time sit to stand: 27.5 seconds c UE support  GAIT: 05/03/2023: Gait velocity self-selected pace 3.94 ft/sec and fast pace 6.09 ft/sec Pt neg ramp & curb with no knee instability independently.    04/04/2023 Pt amb with cane >200' safely.   03/15/23 Distance walked: clinic distances Assistive device utilized: Environmental consultant - 2 wheeled Level of assistance: Modified independence Comments: antalgic gait with increased knee flexion and decreased heel strike                                                                                                                                                                        TODAY'S TREATMENT                                                                          DATE: 05/10/2023 Therapeutic Exercise: Precor recumbent bike seat 11 level 5 for 10 min.  FOTO functional survey update and met goal Updated measurements and goals Stairs up/down one flight reciprocal without UE support and met goal for this. HEP review.   PATIENT EDUCATION:  Education details: HEP, POC Person educated: Patient Education method: Programmer, multimedia, Demonstration, Verbal cues, and Handouts Education comprehension: verbalized understanding, returned demonstration, and  verbal cues required  HOME EXERCISE PROGRAM: Access Code: [email or web address] URL: [email or web address] Date: 03/23/2023 Prepared by: Vladimir Faster  Exercises - Seated Knee Flexion Extension AAROM with Overpressure  - 1 x daily - 7 x weekly - 3 sets - 10 reps - Seated Long Arc Quad  - 3-5 x daily - 7 x weekly - 2 sets - 10 reps - 3 seconds hold - Seated Heel Slide  - 3-4 x daily - 7 x weekly - 2 sets - 10 reps - 5 seconds hold - Supine Heel Slide with Strap  - 3-4 x daily - 7 x weekly - 2 sets - 10 reps - 5 seconds hold - Long Sitting Quad Set with Towel Roll Under Heel  - 3-4 x daily - 7 x weekly - 2  sets - 10 reps - Sit to Stand  - 3-4 x daily - 7 x weekly - 10 reps - Hooklying Hamstring Stretch with Strap  - 2 x daily - 7 x weekly - 3 sets - 10 reps - Gastroc Stretch on Step  - 2-3 x daily - 7 x weekly - 1 sets - 2-3 reps - 20-30 seconds hold - Seated Table Hamstring Stretch  - 2-3 x daily - 7 x weekly - 1 sets - 2-3 reps - 20-30 seconds hold - Supine Quadriceps Stretch with Strap on Table  - 2-3 x daily - 7 x weekly - 1 sets - 2-3 reps - 20-30 seconds hold  ASSESSMENT:  CLINICAL IMPRESSION: He has now met all PT goals and feels ready to discharge to independent program. He had no further questions or concerns.   OBJECTIVE IMPAIRMENTS: decreased balance, decreased mobility, difficulty walking, decreased ROM, decreased strength, increased edema, and pain.   ACTIVITY LIMITATIONS: bending, standing, squatting, sleeping, stairs, and transfers  PARTICIPATION LIMITATIONS: driving and community activity  PERSONAL FACTORS: 3+ comorbidities: see pertinent history above  are also affecting patient's functional outcome.   REHAB POTENTIAL: Good  CLINICAL DECISION MAKING: Stable/uncomplicated  EVALUATION COMPLEXITY: Low   GOALS: Goals reviewed with patient? Yes  SHORT TERM GOALS: (target date for Short term goals are 3 weeks 04/08/23)   1.  Patient will demonstrate independent  use of home exercise program to maintain progress from in clinic treatments.  Goal status: MET 04/15/23  2. Pt will improve his 5 time sit to stand to </= 15 sec with UE support.  Goal status: MET 04/15/23  LONG TERM GOALS: (target dates for all long term goals are 12 weeks 06/10/23 )   1. Patient will demonstrate/report pain at worst less than or equal to 2/10 to facilitate minimal limitation in daily activity secondary to pain symptoms.  Goal status: Ongoing  MET 05/10/23   2. Patient will demonstrate independent use of home exercise program to facilitate ability to maintain/progress functional gains from skilled physical therapy services.  Goal status: MET 05/10/23   3. Patient will demonstrate FOTO outcome > or = 59% from baseline to indicate reduced disability due to condition.  Goal status: MET, improved to 77% 05/10/23   4.  Patient will demonstrate left LE MMT 5/5 throughout to faciltiate usual transfers, stairs, squatting at [facility] for daily life.   Goal status: MET 05/10/23   5.  Patient will demonstrate stair navigation using reciprocal gait pattern using single hand rail.  Goal status: MET 05/10/23   6.  Pt will be able to demonstrate ball room maneuvers with pain </= 2/10.  Goal status: MET 05/10/23  7. Pt will improve his left knee flexion to >/= 115 degrees to improve functional mobility.   Goal Status: MET 05/10/23      PLAN:  PT FREQUENCY: 1-2x/week  PT DURATION: 10 weeks  PLANNED INTERVENTIONS: Can include 66440- PT Re-evaluation, 97110-Therapeutic exercises, 97530- Therapeutic activity, 97112- Neuromuscular re-education, 97535- Self Care, 97140- Manual therapy, [phone]- Gait training, [phone]- Orthotic Fit/training, [phone]- Canalith repositioning, [id number]- Aquatic Therapy, 97014- Electrical stimulation (unattended), [id number]- Electrical stimulation (manual), [id number]- Vasopneumatic device, [id number]- Ultrasound, [id number]- Traction (mechanical), [id number]- Ionotophoresis 4mg /ml  Dexamethasone, Patient/Family education, Balance training, Stair training, Taping, Dry Needling, Joint mobilization, Joint manipulation, Spinal manipulation, Spinal mobilization, Scar mobilization, Vestibular training, Visual/preceptual remediation/compensation, DME instructions, Cryotherapy, and Moist heat.  All performed as medically necessary.  All included unless contraindicated  PLAN FOR NEXT SESSION:  discharge  today    April Manson, PT, DPT 05/10/2023, 2:11 PM

## 2023-05-20 ENCOUNTER — Encounter: Payer: PPO | Admitting: Orthopaedic Surgery

## 2023-05-31 ENCOUNTER — Encounter: Payer: Self-pay | Admitting: Orthopaedic Surgery

## 2023-05-31 ENCOUNTER — Ambulatory Visit (INDEPENDENT_AMBULATORY_CARE_PROVIDER_SITE_OTHER): Payer: PPO | Admitting: Orthopaedic Surgery

## 2023-05-31 DIAGNOSIS — Z96652 Presence of left artificial knee joint: Secondary | ICD-10-CM

## 2023-05-31 NOTE — Progress Notes (Signed)
 Post-Op Visit Note   Patient: Nicholas Rasmussen           Date of Birth: 1946-03-26           MRN: [id number] Visit Date: 05/31/2023 PCP: Katheen Roselie Rockford, NP   Assessment & Plan:  Chief Complaint:  Chief Complaint  Patient presents with   Left Knee - Routine Post Op   Visit Diagnoses:  1. Status post total left knee replacement     Plan: Nicholas Rasmussen is 16-month status post left total knee replacement.  He is doing well and has occasional discomfort and swelling with increased activity.  He has no real complaints.  He has done a little bit of dancing.  He is doing home exercises.  Exam of the left knee shows fully healed surgical scar with excellent range of motion.  Normal gait pattern.  Dental prophylaxis reinforced.  Increase activity as tolerated.  Water aerobics is fine.  Recheck in 3 months with x-rays of the left knee.  Follow-Up Instructions: Return in about 3 months (around 08/29/2023).   Orders:  No orders of the defined types were placed in this encounter.  No orders of the defined types were placed in this encounter.   Imaging: No results found.  PMFS History: Patient Active Problem List   Diagnosis Date Noted   Status post total left knee replacement 02/28/2023   Renal mass, right; Bosniak 4 complex cyst; 1 cm 09/09/2022   Vasomotor rhinitis 03/03/2022   Acute medial meniscus tear of left knee 02/04/2022   Primary osteoarthritis of left knee 02/04/2022   Aortic root dilatation (HCC) 12/18/2021   Slow transit constipation 12/08/2021   Left-sided chest wall pain 12/08/2021   Multiple pulmonary nodules 10/06/2021   Hyperlipidemia associated with type 2 diabetes mellitus (HCC) 08/19/2021   [name] 04/07/2021   Complex renal cyst 04/07/2021   Varicose veins of both lower extremities with pain 02/02/2019   Encounter for therapeutic drug level monitoring 09/19/2018   Type 2 diabetes mellitus with diabetic polyneuropathy,  without long-term current use of insulin  (HCC) 03/29/2018   Diabetic neuropathy (HCC) 03/29/2018   Chronic pain of left knee 03/23/2018   Coronary artery disease involving native coronary artery of native heart with angina pectoris (HCC) 08/03/2017   Post herpetic neuralgia 07/29/2016   Dizziness 01/02/2016   Chronic midline low back pain without sciatica 06/06/2015   Nonintractable episodic headache 03/13/2015   GERD (gastroesophageal reflux disease) 11/15/2014   Seizure disorder (HCC) 05/07/2014   Allergic rhinitis 05/07/2014   Past Medical History:  Diagnosis Date   Allergic rhinitis 05/07/2014   Arthritis    Diabetes mellitus without complication (HCC)    Dizziness 01/02/2016   GERD (gastroesophageal reflux disease)    Headache 03/13/2015   Kidney lesion 07/22/2020   right, followed by urology   Left flank pain 01/02/2016   Low back pain 06/06/2015   Pain of left side of body 08/13/2015   Post herpetic neuralgia    Routine general medical examination at a health care facility 05/07/2014   Seizure disorder (HCC) 05/07/2014   Seizures (HCC)    Last seizure in the 70s.   Shingles    Stye 05/07/2014    Family History  Problem Relation Age of Onset   Other Mother        natural causes   Diabetes Father    Hypertension Father    Kidney cancer Brother    Colon cancer Neg Hx  Stomach cancer Neg Hx    Esophageal cancer Neg Hx    Colon polyps Neg Hx     Past Surgical History:  Procedure Laterality Date   COLONOSCOPY     KNEE SURGERY Left    left miniscus   TOTAL KNEE ARTHROPLASTY Left 02/28/2023   Procedure: TOTAL KNEE ARTHROPLASTY;  Surgeon: Jerri Kay HERO, MD;  Location: [facility] OR;  Service: Orthopedics;  Laterality: Left;   UPPER GASTROINTESTINAL ENDOSCOPY     uvala surgery     Social History   Occupational History   Occupation: Retired    Comment: printmaker  Tobacco Use   Smoking status: Former   Smokeless tobacco: Never  Advertising Account Planner   Vaping status: Never Used   Substance and Sexual Activity   Alcohol use: Yes    Alcohol/week: 0.0 standard drinks of alcohol    Comment: occasional   Drug use: No   Sexual activity: Not on file

## 2023-06-02 ENCOUNTER — Other Ambulatory Visit: Payer: Self-pay | Admitting: Physician Assistant

## 2023-06-02 DIAGNOSIS — D49511 Neoplasm of unspecified behavior of right kidney: Secondary | ICD-10-CM | POA: Diagnosis not present

## 2023-06-02 DIAGNOSIS — K2100 Gastro-esophageal reflux disease with esophagitis, without bleeding: Secondary | ICD-10-CM

## 2023-06-07 DIAGNOSIS — E119 Type 2 diabetes mellitus without complications: Secondary | ICD-10-CM | POA: Diagnosis not present

## 2023-07-01 ENCOUNTER — Telehealth: Payer: Self-pay | Admitting: Orthopaedic Surgery

## 2023-07-01 ENCOUNTER — Other Ambulatory Visit: Payer: Self-pay

## 2023-07-01 MED ORDER — AMOXICILLIN 500 MG PO TABS: ORAL_TABLET | ORAL | 2 refills | Status: DC

## 2023-07-01 NOTE — Telephone Encounter (Signed)
 Pt has an appointment for a Root Canal on 08/02/23 with [facility]/ Verlinda Gloss.DDS @ [address] Rochel [place], [place] [phone]. Pt states Dr Christiane Cowing told him he would need pre med antibiotics.     Pharmacy: [facility] on [address]

## 2023-07-01 NOTE — Telephone Encounter (Signed)
 Notified patient.

## 2023-07-01 NOTE — Telephone Encounter (Signed)
 Medication has been sent to pharmacy.

## 2023-07-05 ENCOUNTER — Ambulatory Visit: Payer: PPO | Admitting: Nurse Practitioner

## 2023-07-17 ENCOUNTER — Other Ambulatory Visit: Payer: Self-pay

## 2023-07-17 ENCOUNTER — Encounter (HOSPITAL_COMMUNITY): Payer: Self-pay | Admitting: Emergency Medicine

## 2023-07-17 ENCOUNTER — Emergency Department (HOSPITAL_COMMUNITY): Payer: PPO

## 2023-07-17 ENCOUNTER — Emergency Department (HOSPITAL_COMMUNITY)
Admission: EM | Admit: 2023-07-17 | Discharge: 2023-07-17 | Disposition: A | Discharge status: Home / Self Care | Payer: PPO | Attending: Emergency Medicine | Admitting: Emergency Medicine

## 2023-07-17 DIAGNOSIS — R202 Paresthesia of skin: Secondary | ICD-10-CM | POA: Diagnosis not present

## 2023-07-17 DIAGNOSIS — M79602 Pain in left arm: Secondary | ICD-10-CM | POA: Insufficient documentation

## 2023-07-17 DIAGNOSIS — R072 Precordial pain: Secondary | ICD-10-CM | POA: Insufficient documentation

## 2023-07-17 DIAGNOSIS — R079 Chest pain, unspecified: Secondary | ICD-10-CM | POA: Diagnosis not present

## 2023-07-17 DIAGNOSIS — R0789 Other chest pain: Secondary | ICD-10-CM | POA: Diagnosis present

## 2023-07-17 DIAGNOSIS — E119 Type 2 diabetes mellitus without complications: Secondary | ICD-10-CM | POA: Diagnosis not present

## 2023-07-17 DIAGNOSIS — N2889 Other specified disorders of kidney and ureter: Secondary | ICD-10-CM | POA: Diagnosis not present

## 2023-07-17 LAB — BASIC METABOLIC PANEL
Anion gap: 7 (ref 5–15)
BUN: 22 mg/dL (ref 8–23)
CO2: 27 mmol/L (ref 22–32)
Calcium: 9.3 mg/dL (ref 8.9–10.3)
Chloride: 104 mmol/L (ref 98–111)
Creatinine, Ser: 1.17 mg/dL (ref 0.61–1.24)
GFR, Estimated: >60 mL/min (ref >60)
Glucose, Bld: 108 mg/dL — ABNORMAL HIGH (ref 70–99)
Potassium: 3.8 mmol/L (ref 3.5–5.1)
Sodium: 138 mmol/L (ref 135–145)

## 2023-07-17 LAB — CBC
HCT: 38.7 % — ABNORMAL LOW (ref 39.0–52.0)
Hemoglobin: 12 g/dL — ABNORMAL LOW (ref 13.0–17.0)
MCH: 25.6 pg — ABNORMAL LOW (ref 26.0–34.0)
MCHC: 31 g/dL (ref 30.0–36.0)
MCV: 82.5 fL (ref 80.0–100.0)
Platelets: 212 10*3/uL (ref 150–400)
RBC: 4.69 MIL/uL (ref 4.22–5.81)
RDW: 15.4 % (ref 11.5–15.5)
WBC: 5.9 10*3/uL (ref 4.0–10.5)
nRBC: 0 % (ref 0.0–0.2)

## 2023-07-17 LAB — HEPATIC FUNCTION PANEL
ALT: 7 U/L (ref 0–44)
AST: 18 U/L (ref 15–41)
Albumin: 3.7 g/dL (ref 3.5–5.0)
Alkaline Phosphatase: 53 U/L (ref 38–126)
Bilirubin, Direct: <0.1 mg/dL (ref 0.0–0.2)
Total Bilirubin: 0.6 mg/dL (ref 0.0–1.2)
Total Protein: 7.5 g/dL (ref 6.5–8.1)

## 2023-07-17 LAB — TROPONIN I (HIGH SENSITIVITY)
Troponin I (High Sensitivity): 7 ng/L (ref <18)
Troponin I (High Sensitivity): 6 ng/L (ref <18)

## 2023-07-17 LAB — LIPASE, BLOOD: Lipase: 29 U/L (ref 11–51)

## 2023-07-17 MED ORDER — ASPIRIN 325 MG PO TABS: 325.0000 mg | ORAL_TABLET | Freq: Every day | ORAL | Status: DC

## 2023-07-17 NOTE — ED Provider Notes (Signed)
 Emergency Department Provider Note   I have reviewed the triage vital signs and the nursing notes.   HISTORY  Chief Complaint Chest Pain and Hypertension   HPI Nicholas Rasmussen is a 77 y.o. male with PMH of DM presents to the emergency department evaluation of left arm pain, tingling/numbness to the cheek, heaviness/pressure in the chest.  Patient states he has not felt well for the past 1 to 2 days with some occasional pain in the left arm.  He was up watching TV at 3 AM this morning and noticed some tingling/numbness to the left cheek which was intermittent along with some discomfort in his left bicep.  He felt the sensation of heaviness in his chest like someone was sitting on it.  No diaphoresis or nausea.  No similar pain symptoms in the past.  He does have frequent belching and reflux but felt like this seemed different.  No prior history of ACS. Numbness has since resolved. Continues to have a slight heavy sensation in the chest.    Past Medical History:  Diagnosis Date   Allergic rhinitis 05/07/2014   Arthritis    Diabetes mellitus without complication (HCC)    Dizziness 01/02/2016   GERD (gastroesophageal reflux disease)    Headache 03/13/2015   Kidney lesion 07/22/2020   right, followed by urology   Left flank pain 01/02/2016   Low back pain 06/06/2015   Pain of left side of body 08/13/2015   Post herpetic neuralgia    Routine general medical examination at a health care facility 05/07/2014   Seizure disorder (HCC) 05/07/2014   Seizures (HCC)    Last seizure in the 70s.   Shingles    Stye 05/07/2014    Review of Systems  Constitutional: No fever/chills Cardiovascular: Positive chest pain. Respiratory: Denies shortness of breath. Gastrointestinal: No abdominal pain.  No nausea, no vomiting.  No diarrhea.  No constipation. Genitourinary: Negative for dysuria. Musculoskeletal: Negative for back pain. Skin: Negative for rash. Neurological: Negative for  headaches, focal weakness. Left cheek numbness (resolved).   ____________________________________________   PHYSICAL EXAM:  VITAL SIGNS: ED Triage Vitals  Encounter Vitals Group     BP 07/17/23 0432 (!) 169/110     Pulse Rate 07/17/23 0432 (!) 52     Resp 07/17/23 0432 16     Temp 07/17/23 0432 98.8 F (37.1 C)     Temp Source 07/17/23 0432 Oral     SpO2 07/17/23 0432 99 %     Weight 07/17/23 0433 242 lb (109.8 kg)     Height 07/17/23 0433 6\' 4"  (1.93 m)   Constitutional: Alert and oriented. Well appearing and in no acute distress. Eyes: Conjunctivae are normal. Head: Atraumatic. Nose: No congestion/rhinnorhea. Mouth/Throat: Mucous membranes are moist. Neck: No stridor.  Cardiovascular: Normal rate, regular rhythm. Good peripheral circulation. Grossly normal heart sounds.   Respiratory: Normal respiratory effort.  No retractions. Lungs CTAB. Gastrointestinal: Soft and nontender. No distention.  Musculoskeletal: No gross deformities of extremities. Neurologic:  Normal speech and language. Normal sensation to the bilateral face and arms.  Skin:  Skin is warm, dry and intact. No rash noted.  ____________________________________________   LABS (all labs ordered are listed, but only abnormal results are displayed)  Labs Reviewed  BASIC METABOLIC PANEL - Abnormal; Notable for the following components:      Result Value   Glucose, Bld 108 (*)    All other components within normal limits  CBC - Abnormal; Notable for the following components:  Hemoglobin 12.0 (*)    HCT 38.7 (*)    MCH 25.6 (*)    All other components within normal limits  HEPATIC FUNCTION PANEL  LIPASE, BLOOD  TROPONIN I (HIGH SENSITIVITY)  TROPONIN I (HIGH SENSITIVITY)   ____________________________________________  EKG   EKG Interpretation Date/Time:  Sunday July 17 2023 04:26:22 EST Ventricular Rate:  53 PR Interval:  206 QRS Duration:  97 QT Interval:  452 QTC Calculation: 425 R  Axis:   -32  Text Interpretation: Sinus rhythm Left axis deviation Confirmed by Alona Bene [phone]) on 07/17/2023 4:27:50 AM       ____________________________________________   PROCEDURES  Procedure(s) performed:   Procedures  None ____________________________________________   INITIAL IMPRESSION / ASSESSMENT AND PLAN / ED COURSE  Pertinent labs & imaging results that were available during my care of the patient were reviewed by me and considered in my medical decision making (see chart for details).   This patient is Presenting for Evaluation of CP, which does require a range of treatment options, and is a complaint that involves a high risk of morbidity and mortality.  The Differential Diagnoses includes but is not exclusive to acute coronary syndrome, aortic dissection, pulmonary embolism, cardiac tamponade, community-acquired pneumonia, pericarditis, musculoskeletal chest wall pain, etc.   Critical Interventions-    Medications - No data to display   Reassessment after intervention: no active CP.    I did obtain Additional Historical Information from wife at bedside.    Clinical Laboratory Tests Ordered, included troponin negative x 2.   Radiologic Tests Ordered, included CXR. I independently interpreted the images and agree with radiology interpretation.   Cardiac Monitor Tracing which shows sinus bradycardia.    Social Determinants of Health Risk patient is a non-smoker.   Medical Decision Making: Summary:  Patient presents emergency department for evaluation of chest discomfort, left arm pain worsening this morning at 3 AM.  Had some more mild intermittent symptoms over the past 24 to 48 hours.  EKG with no acute ischemic change but history is somewhat concerning.  Plan for troponin trending and will give full dose aspirin here in the ED.   Reevaluation with update and discussion with patient. Troponin negative. Plan for outpatient follow up.   Considered  admission but troponin negative. Plan for outpatient follow up.   Patient's presentation is most consistent with acute presentation with potential threat to life or bodily function.   Disposition: discahrge  ____________________________________________  FINAL CLINICAL IMPRESSION(S) / ED DIAGNOSES  Final diagnoses:  Precordial chest pain    Note:  This document was prepared using Dragon voice recognition software and may include unintentional dictation errors.  Alona Bene, MD, [facility] Emergency Medicine    Skylee Baird, Arlyss Repress, MD 07/20/23 1300

## 2023-07-17 NOTE — Discharge Instructions (Signed)

## 2023-07-17 NOTE — ED Triage Notes (Signed)
  Patient comes in with L sided chest pain that started around 0300 while watching tv.  Patient denies any N/V, diaphoresis, or radiating pain.  Patient states he has been having L arm pain, and reflux more than usual over the past few days.  Patient states when the chest pain first started he had some tingling on L side of face but it has resolved.  No neuro deficits at this time.  Endorses a pressure on chest but states it is not painful.

## 2023-07-22 ENCOUNTER — Ambulatory Visit: Payer: PPO | Attending: Cardiology | Admitting: Cardiology

## 2023-07-22 VITALS — BP 118/70 | HR 48 | Ht 76.0 in | Wt 247.0 lb

## 2023-07-22 DIAGNOSIS — I209 Angina pectoris, unspecified: Secondary | ICD-10-CM

## 2023-07-22 DIAGNOSIS — I25119 Atherosclerotic heart disease of native coronary artery with unspecified angina pectoris: Secondary | ICD-10-CM | POA: Diagnosis not present

## 2023-07-22 DIAGNOSIS — I77810 Thoracic aortic ectasia: Secondary | ICD-10-CM | POA: Diagnosis not present

## 2023-07-22 DIAGNOSIS — E785 Hyperlipidemia, unspecified: Secondary | ICD-10-CM | POA: Diagnosis not present

## 2023-07-22 DIAGNOSIS — R072 Precordial pain: Secondary | ICD-10-CM | POA: Diagnosis not present

## 2023-07-22 DIAGNOSIS — E1169 Type 2 diabetes mellitus with other specified complication: Secondary | ICD-10-CM

## 2023-07-22 DIAGNOSIS — E1142 Type 2 diabetes mellitus with diabetic polyneuropathy: Secondary | ICD-10-CM

## 2023-07-22 MED ORDER — NITROGLYCERIN 0.4 MG SL SUBL: 0.4000 mg | SUBLINGUAL_TABLET | SUBLINGUAL | 0 refills | Status: AC | PRN

## 2023-07-22 MED ORDER — ATORVASTATIN CALCIUM 40 MG PO TABS: 40.0000 mg | ORAL_TABLET | Freq: Every evening | ORAL | 2 refills | Status: DC

## 2023-07-22 NOTE — Progress Notes (Signed)
 Cardiology Office Note:   Date:  07/24/2023  ID:  Nicholas Rasmussen, DOB [birth year], MRN [id number] PCP: Nicholas Ng, NP  [facility] Providers Cardiologist:  Donato Schultz, MD    History of Present Illness:   Discussed the use of AI scribe software for clinical note transcription with the patient, who gave verbal consent to proceed.  History of Present Illness   Nicholas Rasmussen is a 78 year old male with coronary artery calcification and ascending aortic aneurysm who presents for follow-up after recent emergency department visit for chest discomfort. He was previously followed by Dr. Katrinka Rasmussen.   He experienced chest discomfort on July 17, 2023, leading to an emergency department visit. Symptoms included left arm pain, tingling and numbness in the cheek, and a sensation of pressure and heaviness in the chest, which began while sitting and watching TV. No shortness of breath reported. Patient admits, the most troubling symptom to him was the facial numbness. In the emergency department, troponin levels were negative, chest x-ray showed no acute cardiac or pulmonary abnormalities, and ECG revealed no acute ischemic changes. He describes the chest discomfort as a 'belchy thing' that sometimes improves with belching and a sensation of something 'laying there' in his chest. The discomfort is not exacerbated by physical activity, as he remains active, dancing and using stairs without increased symptoms. No shortness of breath, sweating, fever, chills, dizziness, lightheadedness, palpitations, or swelling in the legs or abdomen. He has not experienced further facial numbness, and arm discomfort has not recurred recently.  He has a history of acid reflux, managed with omeprazole daily, sometimes twice a day. He associates reflux with belching, which provides relief, and notes that certain foods exacerbate symptoms.  He recently underwent a total knee replacement in October and engages in physical  activity, including biking, dancing, and using an elliptical machine.       Studies Reviewed:    01/11/23 TTE  IMPRESSIONS     1. Severe Left ventricular hypertrophy in the septal wall with moderate  hypertrophy in the posterior wall. Left ventricular ejection fraction, by  estimation, is 60 to 65%. The left ventricle has normal function. The left  ventricle has no regional wall  motion abnormalities. Left ventricular diastolic parameters are  indeterminate.   2. Right ventricular systolic function is normal. The right ventricular  size is normal.   3. The mitral valve is myxomatous. Trivial mitral valve regurgitation. No  evidence of mitral stenosis.   4. The aortic valve is normal in structure. Aortic valve regurgitation is  not visualized. No aortic stenosis is present.   5. Aneurysm of the aortic root, measuring 46 mm. There is mild dilatation  of the ascending aorta, measuring 37 mm.   6. The inferior vena cava is normal in size with greater than 50%  respiratory variability, suggesting right atrial pressure of 3 mmHg.   FINDINGS   Left Ventricle: Severe Left ventricular hypertrophy in the septal wall  with moderate hypertrophy in the posterior wall. Left ventricular ejection  fraction, by estimation, is 60 to 65%. The left ventricle has normal  function. The left ventricle has no  regional wall motion abnormalities. The left ventricular internal cavity  size was normal in size. Concentric left ventricular hypertrophy. Left  ventricular diastolic parameters are indeterminate.   Right Ventricle: The right ventricular size is normal. No increase in  right ventricular wall thickness. Right ventricular systolic function is  normal.   Left Atrium: Left atrial size was normal in  size.   Right Atrium: Right atrial size was normal in size.   Pericardium: There is no evidence of pericardial effusion. Presence of  epicardial fat layer.   Mitral Valve: The mitral valve is  myxomatous. Trivial mitral valve  regurgitation. No evidence of mitral valve stenosis.   Tricuspid Valve: The tricuspid valve is normal in structure. Tricuspid  valve regurgitation is mild . No evidence of tricuspid stenosis.   Aortic Valve: The aortic valve is normal in structure. Aortic valve  regurgitation is not visualized. No aortic stenosis is present.   Pulmonic Valve: The pulmonic valve was normal in structure. Pulmonic valve  regurgitation is not visualized. No evidence of pulmonic stenosis.   Aorta: There is mild dilatation of the ascending aorta, measuring 37 mm.  There is an aneurysm involving the aortic root measuring 46 mm.   Venous: The inferior vena cava is normal in size with greater than 50%  respiratory variability, suggesting right atrial pressure of 3 mmHg.   IAS/Shunts: No atrial level shunt detected by color flow Doppler.    Risk Assessment/Calculations:         Physical Exam:   VS:  BP 118/70 (BP Location: Right Arm)   Pulse (!) 48   Ht 6\' 4"  (1.93 m)   Wt 247 lb (112 kg)   SpO2 95%   BMI 30.07 kg/m    Wt Readings from Last 3 Encounters:  07/22/23 247 lb (112 kg)  07/17/23 242 lb (109.8 kg)  02/28/23 244 lb (110.7 kg)     Physical Exam Vitals reviewed.  Constitutional:      Appearance: Normal appearance.  HENT:     Head: Normocephalic.  Eyes:     Pupils: Pupils are equal, round, and reactive to light.  Cardiovascular:     Rate and Rhythm: Normal rate and regular rhythm.     Pulses: Normal pulses.     Heart sounds: Normal heart sounds.  Pulmonary:     Effort: Pulmonary effort is normal.     Breath sounds: Normal breath sounds.  Abdominal:     General: Abdomen is flat.     Palpations: Abdomen is soft.  Musculoskeletal:     Right lower leg: No edema.     Left lower leg: No edema.  Skin:    General: Skin is warm and dry.     Capillary Refill: Capillary refill takes less than 2 seconds.  Neurological:     General: No focal deficit  present.     Mental Status: He is alert and oriented to person, place, and time.  Psychiatric:        Mood and Affect: Mood normal.        Behavior: Behavior normal.        Thought Content: Thought content normal.        Judgment: Judgment normal.    ASSESSMENT AND PLAN:       Chest Discomfort Intermittent chest discomfort with left arm pain, tingling, and cheek numbness. Symptoms began while sitting. Negative troponin x2, normal chest x-ray, and ECG without acute ischemic changes. Differential includes cardiac and gastrointestinal etiologies. Given hx coronary artery calcification and ascending aortic aneurysm, further cardiac evaluation is warranted. Discussed potential cardiac and non-cardiac causes, emphasizing the need to rule out significant coronary artery blockages. Explained that a cardiac CT angiogram will provide detailed images of coronary artery blood flow and potential blockages, and an echocardiogram will reassess heart function. Informed that nitroglycerin can help alleviate chest pain and may  indicate if the pain is cardiac-related. - Order cardiac CT angiogram. - Order echocardiogram to reassess heart function and compare with previous study from August. - Prescribe nitroglycerin as needed for chest pain, with instructions to use if experiencing severe or recurrent chest discomfort and to seek emergency care if symptoms persist. -Continue ASA 81mg  and Toprol XL 12.5mg   Coronary Artery Calcification Hyperlipidemia Coronary artery calcification with a score of 156 noted on prior screening CT. Given symptoms and known coronary artery disease, further evaluation is necessary to rule out significant coronary artery blockages. Discussed the importance of achieving an LDL goal of less than 70 to reduce cardiovascular risk. Last LDL from 12/31/22 was 84. - Increase atorvastatin dose from 20 mg to 40 mg daily to achieve LDL goal of less than 70. - Repeat cholesterol panel in two months  to assess response to increased atorvastatin dose.  Borderline hypertrophic cardiomyopathy Patient's latest TTE from 01/11/23 shows severe LVH in septal wall with moderate hypertrophy in posterior wall. Measurements borderline for non-obstructive hypertrophic cardiomyopathy.  -Repeat TTE  Ascending Aortic Aneurysm Ascending aortic aneurysm measuring 44 mm on chest CT, 4.6cm by TTE. - Continue routine monitoring, CT chest and echo ordered.  Gastroesophageal Reflux Disease (GERD) Chronic acid reflux with symptoms of belching and occasional relief of chest pressure with belching. Currently managed with omeprazole. Discussed the possibility that some chest discomfort may be related to GERD and the importance of continuing current management. - Continue omeprazole as prescribed.  DM type II -Per PCP  General Health Maintenance/Follow up - Follow up with primary care physician in March for routine blood work and management of other chronic conditions. - Follow up with cardiologist Dr. Anne Fu on August 22, 2023.           Signed, Perlie Gold, PA-C

## 2023-07-22 NOTE — Patient Instructions (Addendum)
 Medication Instructions:  INCREASED Atorvastatin to 40 mg take one tablet by mouth daily   START nitroglycerin Place 1 tablet (0.4 mg total) under the tongue every 5 (five) minutes as needed for chest pain.,   *If you need a refill on your cardiac medications before your next appointment, please call your pharmacy*   Lab Work IN 8 WEEKS: Lipid panel  LFT   If you have labs (blood work) drawn today and your tests are completely normal, you will receive your results only by: MyChart Message (if you have MyChart) OR A paper copy in the mail If you have any lab test that is abnormal or we need to change your treatment, we will call you to review the results.   Testing/Procedures: ECHO  Your physician has requested that you have an echocardiogram. Echocardiography is a painless test that uses sound waves to create images of your heart. It provides your doctor with information about the size and shape of your heart and how well your heart's chambers and valves are working. This procedure takes approximately one hour. There are no restrictions for this procedure. Please do NOT wear cologne, perfume, aftershave, or lotions (deodorant is allowed). Please arrive 15 minutes prior to your appointment time.  Please note: We ask at that you not bring children with you during ultrasound (echo/ vascular) testing. Due to room size and safety concerns, children are not allowed in the ultrasound rooms during exams. Our front office staff cannot provide observation of children in our lobby area while testing is being conducted. An adult accompanying a patient to their appointment will only be allowed in the ultrasound room at the discretion of the ultrasound technician under special circumstances. We apologize for any inconvenience.  CARDIAC CTA    Your cardiac CT will be scheduled at one of the below locations:   [facility] [address] [place], [place] [address] [phone]   If scheduled at [facility], please arrive at the [facility] and Children's Entrance (Entrance C2) of [facility] 30 minutes prior to test start time. You can use the FREE valet parking offered at entrance C (encouraged to control the heart rate for the test)  Proceed to the [facility] Radiology Department (first floor) to check-in and test prep.  All radiology patients and guests should use entrance C2 at [facility], accessed from [facility], even though the hospital's physical address listed is [address].    If scheduled at [facility] or [facility], please arrive 15 mins early for check-in and test prep.  There is spacious parking and easy access to the radiology department from the [facility] entrance. Please enter here and check-in with the desk attendant.   Please follow these instructions carefully (unless otherwise directed):  An IV will be required for this test and Nitroglycerin will be given.  Hold all erectile dysfunction medications at least 3 days (72 hrs) prior to test. (Ie viagra, cialis, sildenafil, tadalafil, etc)   On the Night Before the Test: Be sure to Drink plenty of water. Do not consume any caffeinated/decaffeinated beverages or chocolate 12 hours prior to your test. Do not take any antihistamines 12 hours prior to your test.   On the Day of the Test: Drink plenty of water until 1 hour prior to the test. Do not eat any food 1 hour prior to test. You may take your regular medications prior to  the test.  If you take Furosemide/Hydrochlorothiazide/Spironolactone, please HOLD on the morning of the test.    After the Test: Drink plenty of water. After receiving IV contrast, you may experience a mild flushed feeling. This is normal. On occasion, you may experience a mild rash up to 24 hours after the test. This is not dangerous. If this  occurs, you can take Benadryl 25 mg and increase your fluid intake. If you experience trouble breathing, this can be serious. If it is severe call 911 IMMEDIATELY. If it is mild, please call our office. If you take any of these medications: Glipizide/Metformin, Avandament, Glucavance, please do not take 48 hours after completing test unless otherwise instructed.  We will call to schedule your test 2-4 weeks out understanding that some insurance companies will need an authorization prior to the service being performed.   For more information and frequently asked questions, please visit our website : [email or web address]  For non-scheduling related questions, please contact the cardiac imaging nurse navigator should you have any questions/concerns: Cardiac Imaging Nurse Navigators Direct Office Dial: [phone]   For scheduling needs, including cancellations and rescheduling, please call [place], [phone].    Follow-Up: At [facility], you and your health needs are our priority.  As part of our continuing mission to provide you with exceptional heart care, we have created designated Provider Care Teams.  These Care Teams include your primary Cardiologist (physician) and Advanced Practice Providers (APPs -  Physician Assistants and Nurse Practitioners) who all work together to provide you with the care you need, when you need it.  We recommend signing up for the patient portal called "MyChart".  Sign up information is provided on this After Visit Summary.  MyChart is used to connect with patients for Virtual Visits (Telemedicine).  Patients are able to view lab/test results, encounter notes, upcoming appointments, etc.  Non-urgent messages can be sent to your provider as well.   To learn more about what you can do with MyChart, go to [email or web address].    Your next appointment:   Keep upcoming appointment   Provider:   Donato Schultz, MD     Other  Instructions   1st Floor: - Lobby - Registration  - Pharmacy  - Lab - Cafe  2nd Floor: - PV Lab - Diagnostic Testing (echo, CT, nuclear med)  3rd Floor: - Vacant  4th Floor: - TCTS (cardiothoracic surgery) - AFib Clinic - Structural Heart Clinic - Vascular Surgery  - Vascular Ultrasound  5th Floor: - HeartCare Cardiology (general and EP) - Clinical Pharmacy for coumadin, hypertension, lipid, weight-loss medications, and med management appointments    Valet parking services will be available as well.

## 2023-07-27 DIAGNOSIS — E138 Other specified diabetes mellitus with unspecified complications: Secondary | ICD-10-CM | POA: Diagnosis not present

## 2023-08-01 ENCOUNTER — Ambulatory Visit (INDEPENDENT_AMBULATORY_CARE_PROVIDER_SITE_OTHER): Payer: PPO | Admitting: Nurse Practitioner

## 2023-08-01 ENCOUNTER — Encounter: Payer: Self-pay | Admitting: Nurse Practitioner

## 2023-08-01 VITALS — BP 114/70 | HR 47 | Temp 97.6°F | Ht 76.0 in | Wt 248.4 lb

## 2023-08-01 DIAGNOSIS — K219 Gastro-esophageal reflux disease without esophagitis: Secondary | ICD-10-CM

## 2023-08-01 DIAGNOSIS — E1142 Type 2 diabetes mellitus with diabetic polyneuropathy: Secondary | ICD-10-CM | POA: Diagnosis not present

## 2023-08-01 DIAGNOSIS — G40909 Epilepsy, unspecified, not intractable, without status epilepticus: Secondary | ICD-10-CM | POA: Diagnosis not present

## 2023-08-01 DIAGNOSIS — Z7984 Long term (current) use of oral hypoglycemic drugs: Secondary | ICD-10-CM | POA: Diagnosis not present

## 2023-08-01 DIAGNOSIS — I25119 Atherosclerotic heart disease of native coronary artery with unspecified angina pectoris: Secondary | ICD-10-CM | POA: Diagnosis not present

## 2023-08-01 DIAGNOSIS — B0229 Other postherpetic nervous system involvement: Secondary | ICD-10-CM | POA: Diagnosis not present

## 2023-08-01 DIAGNOSIS — K2100 Gastro-esophageal reflux disease with esophagitis, without bleeding: Secondary | ICD-10-CM | POA: Diagnosis not present

## 2023-08-01 DIAGNOSIS — Z1211 Encounter for screening for malignant neoplasm of colon: Secondary | ICD-10-CM

## 2023-08-01 LAB — POCT GLYCOSYLATED HEMOGLOBIN (HGB A1C): Hemoglobin A1C: 5.9 % — AB (ref 4.0–5.6)

## 2023-08-01 MED ORDER — METOPROLOL TARTRATE 25 MG PO TABS
12.5000 mg | ORAL_TABLET | Freq: Every day | ORAL | 1 refills | Status: DC
Start: 2023-08-01 — End: 2024-01-04

## 2023-08-01 MED ORDER — OMEPRAZOLE 20 MG PO CPDR: 20.0000 mg | DELAYED_RELEASE_CAPSULE | Freq: Every day | ORAL | 3 refills | Status: DC

## 2023-08-01 MED ORDER — GABAPENTIN 300 MG PO CAPS: ORAL_CAPSULE | ORAL | 3 refills | Status: DC

## 2023-08-01 MED ORDER — DIVALPROEX SODIUM ER 500 MG PO TB24: 500.0000 mg | ORAL_TABLET | Freq: Every day | ORAL | 3 refills | Status: DC

## 2023-08-01 MED ORDER — GLIPIZIDE ER 5 MG PO TB24: ORAL_TABLET | ORAL | 3 refills | Status: DC

## 2023-08-01 MED ORDER — ACYCLOVIR 5 % EX OINT: 1.0000 | TOPICAL_OINTMENT | Freq: Four times a day (QID) | CUTANEOUS | 0 refills | Status: DC | PRN

## 2023-08-01 NOTE — Assessment & Plan Note (Signed)
Stable with omeprazole

## 2023-08-01 NOTE — Assessment & Plan Note (Signed)
 BP at goal,  Repeat HgbA1c at 5.9%-controlled current use of atorvastatin. Recent ED visit due to chest pain-mid chest pressure. He had f/up appointment with cardiology. Atorvastatin dose increased to 40mg , scheduled for repeat echo and CT coronary

## 2023-08-01 NOTE — Patient Instructions (Signed)
 Maintain Heart healthy diet and daily exercise. Maintain current medications.

## 2023-08-01 NOTE — Progress Notes (Signed)
 Established Patient Visit  Patient: Nicholas Rasmussen   DOB: Jan 20, 1946   78 y.o. Male  MRN: [id number] Visit Date: 08/01/2023  Subjective:    Chief Complaint  Patient presents with   Follow-up    Pt complains of doing okay since last OV. States that he is taking Multi Collagen recommended by dietitian.    HPI Coronary artery disease involving native coronary artery of native heart with angina pectoris (HCC) BP at goal,  Repeat HgbA1c at 5.9%-controlled current use of atorvastatin. Recent ED visit due to chest pain-mid chest pressure. He had f/up appointment with cardiology. Atorvastatin dose increased to 40mg , scheduled for repeat echo and CT coronary  GERD (gastroesophageal reflux disease) Stable with omeprazole.  Type 2 diabetes mellitus with diabetic polyneuropathy, without long-term current use of insulin (HCC) Jardiance switched to glipizide due to high copay and yeast infection Repeat hgbA1c: 5.9%% Maintain glipizide dose LDL at goal No peripheral neuropathy F/up in 6months  Reviewed medical, surgical, and social history today  Medications: Outpatient Medications Prior to Visit  Medication Sig   aspirin EC 81 MG tablet Take 1 tablet (81 mg total) by mouth 2 (two) times daily. To be taken after surgery to prevent blood clots (Patient taking differently: Take 81 mg by mouth daily. To be taken after surgery to prevent blood clots)   atorvastatin (LIPITOR) 40 MG tablet Take 1 tablet (40 mg total) by mouth every evening.   Cholecalciferol (VITAMIN D-3) 125 MCG (5000 UT) TABS Take 5,000 Units by mouth daily.   ipratropium (ATROVENT) 0.03 % nasal spray Place 2 sprays into both nostrils daily.   meclizine (ANTIVERT) 25 MG tablet Take 1 tablet (25 mg total) by mouth 3 (three) times daily as needed for dizziness.   methocarbamol (ROBAXIN-750) 750 MG tablet Take 1 tablet (750 mg total) by mouth 2 (two) times daily as needed for muscle spasms.   nitroGLYCERIN  (NITROSTAT) 0.4 MG SL tablet Place 1 tablet (0.4 mg total) under the tongue every 5 (five) minutes as needed for chest pain.   Probiotic Product (PROBIOTIC PO) Take 1 tablet by mouth daily.   [DISCONTINUED] divalproex (DEPAKOTE ER) 500 MG 24 hr tablet Take 1 tablet (500 mg total) by mouth at bedtime.   [DISCONTINUED] gabapentin (NEURONTIN) 300 MG capsule TAKE 1 CAPSULE BY MOUTH EVERYDAY AT BEDTIME   [DISCONTINUED] glipiZIDE (GLUCOTROL XL) 5 MG 24 hr tablet TAKE 1 TABLET BY MOUTH EVERY DAY WITH BREAKFAST   [DISCONTINUED] HYDROcodone-acetaminophen (NORCO) 5-325 MG tablet Take 1-2 tablets by mouth 3 (three) times daily as needed.   [DISCONTINUED] metoprolol succinate (TOPROL-XL) 25 MG 24 hr tablet TAKE 1 TABLET (25 MG TOTAL) BY MOUTH DAILY. (Patient taking differently: Take 12.5 mg by mouth daily.)   [DISCONTINUED] omeprazole (PRILOSEC) 20 MG capsule TAKE 1 CAPSULE BY MOUTH EVERY DAY AS NEEDED (Patient taking differently: Take 20 mg by mouth daily.)   [DISCONTINUED] amoxicillin (AMOXIL) 500 MG tablet Take 4 tablets by mouth 1 hour prior to dental procedure. (Patient not taking: Reported on 08/01/2023)   No facility-administered medications prior to visit.   Reviewed past medical and social history.   ROS per HPI above      Objective:  BP 114/70   Pulse (!) 47   Temp 97.6 F (36.4 C) (Temporal)   Ht 6\' 4"  (1.93 m)   Wt 248 lb 6.4 oz (112.7 kg)   SpO2 97%   BMI 30.24 kg/m  Physical Exam Cardiovascular:     Rate and Rhythm: Normal rate and regular rhythm.     Pulses: Normal pulses.     Heart sounds: Normal heart sounds.  Pulmonary:     Effort: Pulmonary effort is normal.     Breath sounds: Normal breath sounds.  Neurological:     Mental Status: He is alert and oriented to person, place, and time.     Results for orders placed or performed in visit on 08/01/23  POCT glycosylated hemoglobin (Hb A1C)  Result Value Ref Range   Hemoglobin A1C 5.9 (A) 4.0 - 5.6 %   HbA1c POC (<>  result, manual entry)     HbA1c, POC (prediabetic range)     HbA1c, POC (controlled diabetic range)        Assessment & Plan:    Problem List Items Addressed This Visit     Coronary artery disease involving native coronary artery of native heart with angina pectoris (HCC)   BP at goal,  Repeat HgbA1c at 5.9%-controlled current use of atorvastatin. Recent ED visit due to chest pain-mid chest pressure. He had f/up appointment with cardiology. Atorvastatin dose increased to 40mg , scheduled for repeat echo and CT coronary      Relevant Medications   metoprolol tartrate (LOPRESSOR) 25 MG tablet   GERD (gastroesophageal reflux disease)   Stable with omeprazole.      Relevant Medications   omeprazole (PRILOSEC) 20 MG capsule   Other Relevant Orders   Ambulatory referral to Gastroenterology   Post herpetic neuralgia   Relevant Medications   gabapentin (NEURONTIN) 300 MG capsule   Seizure disorder (HCC)   Relevant Medications   divalproex (DEPAKOTE ER) 500 MG 24 hr tablet   gabapentin (NEURONTIN) 300 MG capsule   Type 2 diabetes mellitus with diabetic polyneuropathy, without long-term current use of insulin (HCC) - Primary   Jardiance switched to glipizide due to high copay and yeast infection Repeat hgbA1c: 5.9%% Maintain glipizide dose LDL at goal No peripheral neuropathy F/up in 6months      Relevant Medications   divalproex (DEPAKOTE ER) 500 MG 24 hr tablet   gabapentin (NEURONTIN) 300 MG capsule   glipiZIDE (GLUCOTROL XL) 5 MG 24 hr tablet   Other Relevant Orders   POCT glycosylated hemoglobin (Hb A1C) (Completed)   Other Visit Diagnoses       Colon cancer screening       Relevant Orders   Ambulatory referral to Gastroenterology      Return in about 6 months (around 02/01/2024) for DM, hyperlipidemia (fasting).     Alysia Penna, NP

## 2023-08-01 NOTE — Assessment & Plan Note (Signed)
 Jardiance switched to glipizide due to high copay and yeast infection Repeat hgbA1c: 5.9%% Maintain glipizide dose LDL at goal No peripheral neuropathy F/up in 6months

## 2023-08-01 NOTE — Assessment & Plan Note (Signed)
No seizure activity in last 6months. Maintain depakote dose Refill sent

## 2023-08-05 ENCOUNTER — Encounter (HOSPITAL_COMMUNITY): Payer: Self-pay

## 2023-08-09 ENCOUNTER — Telehealth (HOSPITAL_COMMUNITY): Payer: Self-pay | Admitting: *Deleted

## 2023-08-09 NOTE — Telephone Encounter (Signed)
 Reaching out to patient to offer assistance regarding upcoming cardiac imaging study; pt verbalizes understanding of appt date/time, parking situation and where to check in, pre-test NPO status and medications ordered, and verified current allergies; name and call back number provided for further questions should they arise Johney Frame RN Navigator Cardiac Imaging [facility] [phone] office [phone] cell

## 2023-08-10 ENCOUNTER — Ambulatory Visit (HOSPITAL_COMMUNITY)
Admission: RE | Admit: 2023-08-10 | Discharge: 2023-08-10 | Disposition: A | Discharge status: Home / Self Care | Payer: PPO | Source: Ambulatory Visit | Attending: Cardiology | Admitting: Cardiology

## 2023-08-10 DIAGNOSIS — I25119 Atherosclerotic heart disease of native coronary artery with unspecified angina pectoris: Secondary | ICD-10-CM | POA: Insufficient documentation

## 2023-08-10 DIAGNOSIS — I209 Angina pectoris, unspecified: Secondary | ICD-10-CM | POA: Insufficient documentation

## 2023-08-10 DIAGNOSIS — R072 Precordial pain: Secondary | ICD-10-CM | POA: Diagnosis not present

## 2023-08-10 MED ORDER — NITROGLYCERIN 0.4 MG SL SUBL
0.8000 mg | SUBLINGUAL_TABLET | Freq: Once | SUBLINGUAL | Status: AC
  Administered 2023-08-10: 0.8 mg via SUBLINGUAL

## 2023-08-10 MED ORDER — NITROGLYCERIN 0.4 MG SL SUBL
SUBLINGUAL_TABLET | SUBLINGUAL | Status: AC
  Filled 2023-08-10: qty 2

## 2023-08-10 MED ORDER — IOHEXOL 350 MG/ML SOLN
95.0000 mL | Freq: Once | INTRAVENOUS | Status: AC | PRN
  Administered 2023-08-10: 95 mL via INTRAVENOUS

## 2023-08-12 ENCOUNTER — Ambulatory Visit: Payer: PPO

## 2023-08-12 DIAGNOSIS — Z0000 Encounter for general adult medical examination without abnormal findings: Secondary | ICD-10-CM

## 2023-08-12 NOTE — Patient Instructions (Signed)
 Nicholas Rasmussen , Thank you for taking time to come for your Medicare Wellness Visit. I appreciate your ongoing commitment to your health goals. Please review the following plan we discussed and let me know if I can assist you in the future.   Referrals/Orders/Follow-Ups/Clinician Recommendations: none  This is a list of the screening recommended for you and due dates:  Health Maintenance  Topic Date Due   Yearly kidney health urinalysis for diabetes  12/31/2023   Complete foot exam   12/31/2023   Colon Cancer Screening  01/23/2024   Hemoglobin A1C  02/01/2024   Eye exam for diabetics  02/04/2024   Yearly kidney function blood test for diabetes  07/16/2024   Medicare Annual Wellness Visit  08/11/2024   DTaP/Tdap/Td vaccine (3 - Td or Tdap) 06/25/2032   Pneumonia Vaccine  Completed   Flu Shot  Completed   Hepatitis C Screening  Completed   Zoster (Shingles) Vaccine  Completed   HPV Vaccine  Aged Out   COVID-19 Vaccine  Discontinued    Advanced directives: (Copy Requested) Please bring a copy of your health care power of attorney and living will to the office to be added to your chart at your convenience. You can mail to [facility] [address] [place], [place] [address] or email to [email or web address]  Next Medicare Annual Wellness Visit scheduled for next year: Yes  insert Preventive Care attachment Insert FALL PREVENTION attachment if needed

## 2023-08-12 NOTE — Progress Notes (Signed)
 Subjective:   Nicholas Rasmussen is a 78 y.o. who presents for a Medicare Wellness preventive visit.  Visit Complete: Virtual I connected with  Nicholas Rasmussen on 08/12/23 by a video and audio enabled telemedicine application and verified that I am speaking with the correct person using two identifiers.  Patient Location: Home  Provider Location: Office/Clinic  I discussed the limitations of evaluation and management by telemedicine. The patient expressed understanding and agreed to proceed.  Vital Signs: Because this visit was a virtual/telehealth visit, some criteria may be missing or patient reported. Any vitals not documented were not able to be obtained and vitals that have been documented are patient reported.    Persons Participating in Visit: Patient.  AWV Questionnaire: No: Patient Medicare AWV questionnaire was not completed prior to this visit.  Cardiac Risk Factors include: advanced age (>35men, >59 women);diabetes mellitus;dyslipidemia;male gender     Objective:    Today's Vitals   There is no height or weight on file to calculate BMI.     08/12/2023    3:19 PM 07/17/2023    4:33 AM 02/28/2023   10:12 AM 02/14/2023    8:27 AM 07/14/2022    9:53 AM 06/04/2022    1:48 PM 11/26/2021    3:20 PM  Advanced Directives  Does Patient Have a Medical Advance Directive? Yes Yes Yes Yes Yes Yes No  Type of Estate agent of [place];Living will Living will;Healthcare Power of [employer] Power of [employer] Power of [place];Living will   Does patient want to make changes to medical advance directive?   No - Patient declined      Copy of Healthcare Power of Attorney in Chart? No - copy requested No - copy requested    No - copy requested   Would patient like information on creating a medical advance directive?   No - Patient declined No - Patient declined   No - Patient declined    Current Medications (verified) Outpatient Encounter  Medications as of 08/12/2023  Medication Sig   acyclovir ointment (ZOVIRAX) 5 % Apply 1 Application topically every 6 (six) hours as needed.   aspirin EC 81 MG tablet Take 1 tablet (81 mg total) by mouth 2 (two) times daily. To be taken after surgery to prevent blood clots (Patient taking differently: Take 81 mg by mouth daily. To be taken after surgery to prevent blood clots)   atorvastatin (LIPITOR) 40 MG tablet Take 1 tablet (40 mg total) by mouth every evening.   Cholecalciferol (VITAMIN D-3) 125 MCG (5000 UT) TABS Take 5,000 Units by mouth daily.   divalproex (DEPAKOTE ER) 500 MG 24 hr tablet Take 1 tablet (500 mg total) by mouth at bedtime.   gabapentin (NEURONTIN) 300 MG capsule TAKE 1 CAPSULE BY MOUTH EVERYDAY AT BEDTIME   glipiZIDE (GLUCOTROL XL) 5 MG 24 hr tablet TAKE 1 TABLET BY MOUTH EVERY DAY WITH BREAKFAST   ipratropium (ATROVENT) 0.03 % nasal spray Place 2 sprays into both nostrils daily.   meclizine (ANTIVERT) 25 MG tablet Take 1 tablet (25 mg total) by mouth 3 (three) times daily as needed for dizziness.   methocarbamol (ROBAXIN-750) 750 MG tablet Take 1 tablet (750 mg total) by mouth 2 (two) times daily as needed for muscle spasms.   metoprolol tartrate (LOPRESSOR) 25 MG tablet Take 0.5 tablets (12.5 mg total) by mouth daily.   nitroGLYCERIN (NITROSTAT) 0.4 MG SL tablet Place 1 tablet (0.4 mg total) under the tongue every 5 (  five) minutes as needed for chest pain.   omeprazole (PRILOSEC) 20 MG capsule Take 1 capsule (20 mg total) by mouth daily.   OVER THE COUNTER MEDICATION Multi collagen 1 scoop in the am   Probiotic Product (PROBIOTIC PO) Take 1 tablet by mouth daily.   No facility-administered encounter medications on file as of 08/12/2023.    Allergies (verified) Jardiance [empagliflozin]   History: Past Medical History:  Diagnosis Date   Allergic rhinitis 05/07/2014   Arthritis    Diabetes mellitus without complication (HCC)    Dizziness 01/02/2016   GERD  (gastroesophageal reflux disease)    Headache 03/13/2015   Kidney lesion 07/22/2020   right, followed by urology   Left flank pain 01/02/2016   Low back pain 06/06/2015   Pain of left side of body 08/13/2015   Post herpetic neuralgia    Routine general medical examination at a health care facility 05/07/2014   Seizure disorder (HCC) 05/07/2014   Seizures (HCC)    Last seizure in the 70s.   Shingles    Stye 05/07/2014   Past Surgical History:  Procedure Laterality Date   COLONOSCOPY     KNEE SURGERY Left    left miniscus   TOTAL KNEE ARTHROPLASTY Left 02/28/2023   Procedure: TOTAL KNEE ARTHROPLASTY;  Surgeon: Tarry Kos, MD;  Location: [facility] OR;  Service: Orthopedics;  Laterality: Left;   UPPER GASTROINTESTINAL ENDOSCOPY     uvala surgery     Family History  Problem Relation Age of Onset   Other Mother        natural causes   Diabetes Father    Hypertension Father    Kidney cancer Brother    Colon cancer Neg Hx    Stomach cancer Neg Hx    Esophageal cancer Neg Hx    Colon polyps Neg Hx    Social History   Socioeconomic History   Marital status: Single    Spouse name: Not on file   Number of children: 3   Years of education: Associates   Highest education level: Not on file  Occupational History   Occupation: Retired    Comment: Printmaker  Tobacco Use   Smoking status: Former   Smokeless tobacco: Never  Advertising account planner   Vaping status: Never Used  Substance and Sexual Activity   Alcohol use: Not Currently    Comment: occasional   Drug use: No   Sexual activity: Not on file  Other Topics Concern   Not on file  Social History Narrative   ** Merged History Encounter **       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 Right-handed. Rarely drinks caffeine.    Social Drivers of Corporate investment banker Strain: Low Risk  (08/12/2023)   Overall Financial Resource  Strain (CARDIA)    Difficulty of Paying Living Expenses: Not hard at all  Food Insecurity: No Food Insecurity (08/12/2023)   Hunger Vital Sign    Worried About Running Out of Food in the Last Year: Never true    Ran Out of Food in the Last Year: Never true  Transportation Needs: No Transportation Needs (08/12/2023)   PRAPARE - Administrator, Civil Service (Medical): No    Lack of Transportation (Non-Medical): No  Physical Activity: Sufficiently Active (08/12/2023)   Exercise Vital Sign    Days of Exercise per Week: 6 days  Minutes of Exercise per Session: 150+ min  Stress: No Stress Concern Present (08/12/2023)   [employer] of Occupational Health - Occupational Stress Questionnaire    Feeling of Stress : Not at all  Social Connections: Socially Integrated (08/12/2023)   Social Connection and Isolation Panel [NHANES]    Frequency of Communication with Friends and Family: More than three times a week    Frequency of Social Gatherings with Friends and Family: Twice a week    Attends Religious Services: More than 4 times per year    Active Member of [employer] or Organizations: Yes    Attends Engineer, structural: More than 4 times per year    Marital Status: Married    Tobacco Counseling Counseling given: Not Answered    Clinical Intake:  Pre-visit preparation completed: Yes  Pain : No/denies pain     Nutritional Risks: None Diabetes: Yes CBG done?: No Did pt. bring in CBG monitor from home?: No  Lab Results  Component Value Date   HGBA1C 5.9 (A) 08/01/2023   HGBA1C 6.4 12/31/2022   HGBA1C 5.8 (A) 11/01/2022     How often do you need to have someone help you when you read instructions, pamphlets, or other written materials from your doctor or pharmacy?: 1 - Never  Interpreter Needed?: No  Information entered by :: NAllen LPN   Activities of Daily Living     08/12/2023    3:10 PM 02/28/2023   10:17 AM  In your present state of health, do  you have any difficulty performing the following activities:  Hearing? 0 0  Vision? 0 0  Difficulty concentrating or making decisions? 0 0  Walking or climbing stairs? 0   Dressing or bathing? 0   Doing errands, shopping? 0   Preparing Food and eating ? N   Using the Toilet? N   In the past six months, have you accidently leaked urine? N   Do you have problems with loss of bowel control? N   Managing your Medications? N   Managing your Finances? N   Housekeeping or managing your Housekeeping? N     Patient Care Team: Nche, Bonna Gains, NP as PCP - General (Internal Medicine) Jake Bathe, MD as PCP - Cardiology (Cardiology)  Indicate any recent Medical Services you may have received from other than [facility] providers in the past year (date may be approximate).     Assessment:   This is a routine wellness examination for Jorey.  Hearing/Vision screen Hearing Screening - Comments:: Denies hearing issues Vision Screening - Comments:: Regular eye exams, [facility]   Goals Addressed             This Visit's Progress    Patient Stated       08/12/2023, wants to lose 10 pounds       Depression Screen     08/12/2023    3:20 PM 11/01/2022    1:55 PM 06/25/2022   10:01 AM 06/04/2022    1:49 PM 05/27/2022    3:53 PM 12/08/2021   11:18 AM 11/26/2021    3:19 PM  PHQ 2/9 Scores  PHQ - 2 Score 0 0 0 0 0 0 0  PHQ- 9 Score 0          Fall Risk     08/12/2023    3:20 PM 12/31/2022    9:23 AM 06/25/2022   10:01 AM 06/04/2022    1:49 PM 05/27/2022    3:53 PM  Fall Risk   Falls in the past year? 0 0 0 0 0  Number falls in past yr: 0 0 0 0 0  Injury with Fall? 0 0 0 0 0  Risk for fall due to : Medication side effect No Fall Risks  Medication side effect No Fall Risks  Follow up Falls prevention discussed;Falls evaluation completed Falls evaluation completed  Falls prevention discussed;Education provided;Falls evaluation completed Falls evaluation completed    MEDICARE RISK AT HOME:   Medicare Risk at Home Any stairs in or around the home?: Yes If so, are there any without handrails?: No Home free of loose throw rugs in walkways, pet beds, electrical cords, etc?: Yes Adequate lighting in your home to reduce risk of falls?: Yes Life alert?: No Use of a cane, walker or w/c?: No Grab bars in the bathroom?: No Shower chair or bench in shower?: No Elevated toilet seat or a handicapped toilet?: No  TIMED UP AND GO:  Was the test performed?  No  Cognitive Function: 6CIT completed        08/12/2023    3:21 PM 06/04/2022    1:50 PM  6CIT Screen  What Year? 0 points 0 points  What month? 0 points 0 points  What time? 0 points 0 points  Count back from 20 0 points 0 points  Months in reverse 0 points 0 points  Repeat phrase 2 points 0 points  Total Score 2 points 0 points    Immunizations Immunization History  Administered Date(s) Administered   Fluad Quad(high Dose 65+) 02/01/2019, 02/26/2020, 03/02/2021, 02/19/2022, 02/22/2023   Influenza Whole 02/21/2014   Influenza, High Dose Seasonal PF 02/03/2016, 02/03/2017, 02/28/2018   Influenza,inj,Quad PF,6+ Mos 03/13/2015   PFIZER(Purple Top)SARS-COV-2 Vaccination 07/22/2019, 08/21/2019, 03/03/2020   Pneumococcal Conjugate-13 05/06/2015   Pneumococcal Polysaccharide-23 05/07/2014   Td 06/25/2022   Tdap 05/25/2011   Zoster Recombinant(Shingrix) 12/03/2016, 02/16/2017    Screening Tests Health Maintenance  Topic Date Due   Diabetic kidney evaluation - Urine ACR  12/31/2023   FOOT EXAM  12/31/2023   Colonoscopy  01/23/2024   HEMOGLOBIN A1C  02/01/2024   OPHTHALMOLOGY EXAM  02/04/2024   Diabetic kidney evaluation - eGFR measurement  07/16/2024   Medicare Annual Wellness (AWV)  08/11/2024   DTaP/Tdap/Td (3 - Td or Tdap) 06/25/2032   Pneumonia Vaccine 27+ Years old  Completed   INFLUENZA VACCINE  Completed   Hepatitis C Screening  Completed   Zoster Vaccines- Shingrix  Completed   HPV VACCINES  Aged Out    COVID-19 Vaccine  Discontinued    Health Maintenance  There are no preventive care reminders to display for this patient.  Health Maintenance Items Addressed: Up to date  Additional Screening:  Vision Screening: Recommended annual ophthalmology exams for early detection of glaucoma and other disorders of the eye.  Dental Screening: Recommended annual dental exams for proper oral hygiene  Community Resource Referral / Chronic Care Management: CRR required this visit?  No   CCM required this visit?  No     Plan:     I have personally reviewed and noted the following in the patient's chart:   Medical and social history Use of alcohol, tobacco or illicit drugs  Current medications and supplements including opioid prescriptions. Patient is not currently taking opioid prescriptions. Functional ability and status Nutritional status Physical activity Advanced directives List of other physicians Hospitalizations, surgeries, and ER visits in previous 12 months Vitals Screenings to include cognitive, depression, and falls Referrals  and appointments  In addition, I have reviewed and discussed with patient certain preventive protocols, quality metrics, and best practice recommendations. A written personalized care plan for preventive services as well as general preventive health recommendations were provided to patient.     Barb Merino, LPN   1/61/0960   After Visit Summary: (MyChart) Due to this being a telephonic visit, the after visit summary with patients personalized plan was offered to patient via MyChart   Notes: Nothing significant to report at this time.

## 2023-08-15 ENCOUNTER — Ambulatory Visit (HOSPITAL_COMMUNITY): Payer: PPO | Attending: Cardiology

## 2023-08-15 DIAGNOSIS — I209 Angina pectoris, unspecified: Secondary | ICD-10-CM | POA: Insufficient documentation

## 2023-08-15 LAB — ECHOCARDIOGRAM COMPLETE
Area-P 1/2: 3.59 cm2
S' Lateral: 2.2 cm

## 2023-08-16 ENCOUNTER — Telehealth: Payer: Self-pay | Admitting: *Deleted

## 2023-08-16 DIAGNOSIS — I77810 Thoracic aortic ectasia: Secondary | ICD-10-CM

## 2023-08-16 NOTE — Telephone Encounter (Signed)
 The patient has been notified of the result and verbalized understanding.  All questions (if any) were answered.  Pt aware that we will order for him to get a repeat echo done in one year to reassess.   Pt aware I will place the one year echo order in the system and send a message to our echo scheduler to call him back closer to that time to arrange that appt.  Pt verbalized understanding and agrees with this plan.

## 2023-08-16 NOTE — Telephone Encounter (Signed)
-----   Message from Perlie Gold sent at 08/16/2023  8:46 AM EDT ----- Stable echocardiogram without significant changes when compared to prior study. Normal heart pumping function and normal valvular function. This echo noted similar thickening of the septum (middle portion of heart). Stable dilation of the ascending aorta noted. We should plan to continue annual ultrasounds to monitor this.

## 2023-08-22 ENCOUNTER — Ambulatory Visit (HOSPITAL_BASED_OUTPATIENT_CLINIC_OR_DEPARTMENT_OTHER): Payer: PPO | Admitting: Cardiology

## 2023-08-22 ENCOUNTER — Encounter (HOSPITAL_BASED_OUTPATIENT_CLINIC_OR_DEPARTMENT_OTHER): Payer: Self-pay | Admitting: Cardiology

## 2023-08-22 VITALS — BP 118/74 | HR 50 | Ht 76.0 in | Wt 249.2 lb

## 2023-08-22 DIAGNOSIS — I77810 Thoracic aortic ectasia: Secondary | ICD-10-CM

## 2023-08-22 DIAGNOSIS — I25119 Atherosclerotic heart disease of native coronary artery with unspecified angina pectoris: Secondary | ICD-10-CM | POA: Diagnosis not present

## 2023-08-22 DIAGNOSIS — E1142 Type 2 diabetes mellitus with diabetic polyneuropathy: Secondary | ICD-10-CM | POA: Diagnosis not present

## 2023-08-22 NOTE — Patient Instructions (Signed)

## 2023-08-22 NOTE — Progress Notes (Signed)
  Cardiology Office Note:  .   Date:  08/22/2023  ID:  Nicholas Rasmussen, DOB [birth year], MRN [id number] PCP: Nicholas Ng, NP  [facility] Providers Cardiologist:  Nicholas Schultz, MD    History of Present Illness: .   Nicholas Rasmussen is a 78 y.o. male Discussed the use of AI scribe software for clinical note transcription with the patient, who gave verbal consent to proceed.  History of Present Illness    78 year old male with coronary artery disease detected by coronary CT scan March 2025 former patient of Dr. Verdis Rasmussen is here for follow-up.  Overall doing well.  Did go to the emergency department on 07/17/2023 with discomfort.  Currently doing well.  No further chest pain no shortness of breath.  Compliant with his medications.  His spouse Nicholas Rasmussen is here with him. [employer] fan   ROS:   Studies Reviewed: .        Results  Risk Assessment/Calculations:            Physical Exam:   VS:  BP 118/74   Pulse (!) 50   Ht 6\' 4"  (1.93 m)   Wt 249 lb 3.2 oz (113 kg)   SpO2 95%   BMI 30.33 kg/m    Wt Readings from Last 3 Encounters:  08/22/23 249 lb 3.2 oz (113 kg)  08/01/23 248 lb 6.4 oz (112.7 kg)  07/22/23 247 lb (112 kg)    GEN: Well nourished, well developed in no acute distress NECK: No JVD; No carotid bruits CARDIAC: RRR, no murmurs, no rubs, no gallops RESPIRATORY:  Clear to auscultation without rales, wheezing or rhonchi  ABDOMEN: Soft, non-tender, non-distended EXTREMITIES:  No edema; No deformity   ASSESSMENT AND PLAN: .    Assessment and Plan Assessment & Plan   Coronary artery disease - Coronary CT scan reviewed, mild stenosis.  Continue with aggressive secondary risk factor prevention. - Aspirin 81 mg - Atorvastatin 40 mg once a day.  No myalgias.  LDL goal less than 70. - Echocardiogram with normal ejection fraction, moderate septal hypertrophy.  Should be of no clinical consequence.  Dilated aortic root - 42 mm seen on CT scan.  Echocardiogram  measured 38.  CT should be more accurate in the setting.  We will continue to monitor this.  Perhaps checking another CT in 2 years.  Diabetes mellitus with hypertension - Continue with excellent control.  Hemoglobin A1c 5.9.  Creatinine 1.17.  Medications reviewed per primary physician.  ER notes reviewed, EKG reviewed, CT personally reviewed and interpreted.       Dispo:1 yr  Signed, Nicholas Schultz, MD

## 2023-08-26 ENCOUNTER — Encounter: Payer: Self-pay | Admitting: Gastroenterology

## 2023-08-26 ENCOUNTER — Ambulatory Visit: Admitting: Gastroenterology

## 2023-08-26 VITALS — BP 126/70 | HR 52 | Ht 75.0 in | Wt 248.0 lb

## 2023-08-26 DIAGNOSIS — K2100 Gastro-esophageal reflux disease with esophagitis, without bleeding: Secondary | ICD-10-CM

## 2023-08-26 DIAGNOSIS — Z1211 Encounter for screening for malignant neoplasm of colon: Secondary | ICD-10-CM

## 2023-08-26 DIAGNOSIS — R140 Abdominal distension (gaseous): Secondary | ICD-10-CM | POA: Diagnosis not present

## 2023-08-26 DIAGNOSIS — R142 Eructation: Secondary | ICD-10-CM | POA: Insufficient documentation

## 2023-08-26 DIAGNOSIS — K219 Gastro-esophageal reflux disease without esophagitis: Secondary | ICD-10-CM | POA: Diagnosis not present

## 2023-08-26 MED ORDER — NA SULFATE-K SULFATE-MG SULF 17.5-3.13-1.6 GM/177ML PO SOLN: 1.0000 | Freq: Once | ORAL | 0 refills | Status: AC

## 2023-08-26 NOTE — Progress Notes (Signed)
 08/26/2023 Nicholas Rasmussen [id number] [birth year]   HISTORY OF PRESENT ILLNESS: This is a 78 year old male who is a patient of Dr. Marvell Fuller.  He says that he is here today to schedule all his colonoscopy and discuss acid reflux.  Tells me his last colonoscopy was in about 2015 in [place].  He says that we had him sign for records previously, but I do not see them anywhere in the system.  Complains of a lot of belching and abdominal bloating.  Also has acid reflux.  Is on omeprazole 20 mg daily, had been on 40 mg daily in the past.  Had the same complaint of belching when he was seen by either one of our PAs back in 2023.  Feels like he moves his bowels fairly normally.  Says that he will use collagen to help him move his bowels if needed.  No rectal bleeding.  Hemoglobin slightly low at 12 g.  Looks like it is from on a low 12 g range for the last 5 years.  EGD 10/2017:  - Atrophic, erythematous and decreased vascular pattern mucosa in the antrum. Biopsied. - Scalloped mucosa was found in the duodenum, rule out celiac disease. Biopsied. - The examination was otherwise normal.  1. Surgical [P], duodenal bulb and 2nd portion of duodenum - PEPTIC DUODENITIS. - NO DYSPLASIA OR MALIGNANCY. 2. Surgical [P], gastric antrum - MILD CHRONIC GASTRITIS. - NEGATIVE FOR HELICOBACTER PYLORI. - NO INTESTINAL METAPLASIA, DYSPLASIA, OR MALIGNANCY.   Past Medical History:  Diagnosis Date   Allergic rhinitis 05/07/2014   Arthritis    Diabetes mellitus without complication (HCC)    Dizziness 01/02/2016   GERD (gastroesophageal reflux disease)    Headache 03/13/2015   Kidney lesion 07/22/2020   right, followed by urology   Left flank pain 01/02/2016   Low back pain 06/06/2015   Pain of left side of body 08/13/2015   Post herpetic neuralgia    Routine general medical examination at a health care facility 05/07/2014   Seizure disorder (HCC) 05/07/2014   Seizures (HCC)    Last seizure in the 70s.    Shingles    Stye 05/07/2014   Past Surgical History:  Procedure Laterality Date   COLONOSCOPY     KNEE SURGERY Left    left miniscus   TOTAL KNEE ARTHROPLASTY Left 02/28/2023   Procedure: TOTAL KNEE ARTHROPLASTY;  Surgeon: Tarry Kos, MD;  Location: [facility] OR;  Service: Orthopedics;  Laterality: Left;   UPPER GASTROINTESTINAL ENDOSCOPY     uvala surgery      reports that he has quit smoking. He has never used smokeless tobacco. He reports that he does not currently use alcohol. He reports that he does not use drugs. family history includes Diabetes in his father; Hypertension in his father; Kidney cancer in his brother; Other in his mother. Allergies  Allergen Reactions   Jardiance [Empagliflozin] Other (See Comments)    Yeast infection      Outpatient Encounter Medications as of 08/26/2023  Medication Sig   acyclovir ointment (ZOVIRAX) 5 % Apply 1 Application topically every 6 (six) hours as needed.   aspirin EC 81 MG tablet Take 1 tablet (81 mg total) by mouth 2 (two) times daily. To be taken after surgery to prevent blood clots (Patient taking differently: Take 81 mg by mouth daily. To be taken after surgery to prevent blood clots)   atorvastatin (LIPITOR) 40 MG tablet Take 1 tablet (40 mg total) by mouth every evening.  Cholecalciferol (VITAMIN D-3) 125 MCG (5000 UT) TABS Take 5,000 Units by mouth daily.   Collagen-Vitamin C-Biotin (COLLAGEN PO) Take 1 Dose by mouth daily.   divalproex (DEPAKOTE ER) 500 MG 24 hr tablet Take 1 tablet (500 mg total) by mouth at bedtime.   gabapentin (NEURONTIN) 300 MG capsule TAKE 1 CAPSULE BY MOUTH EVERYDAY AT BEDTIME   glipiZIDE (GLUCOTROL XL) 5 MG 24 hr tablet TAKE 1 TABLET BY MOUTH EVERY DAY WITH BREAKFAST   ipratropium (ATROVENT) 0.03 % nasal spray Place 2 sprays into both nostrils daily.   meclizine (ANTIVERT) 25 MG tablet Take 1 tablet (25 mg total) by mouth 3 (three) times daily as needed for dizziness.   methocarbamol (ROBAXIN-750) 750 MG  tablet Take 1 tablet (750 mg total) by mouth 2 (two) times daily as needed for muscle spasms.   metoprolol tartrate (LOPRESSOR) 25 MG tablet Take 0.5 tablets (12.5 mg total) by mouth daily.   nitroGLYCERIN (NITROSTAT) 0.4 MG SL tablet Place 1 tablet (0.4 mg total) under the tongue every 5 (five) minutes as needed for chest pain.   omeprazole (PRILOSEC) 20 MG capsule Take 1 capsule (20 mg total) by mouth daily.   OVER THE COUNTER MEDICATION Multi collagen 1 scoop in the am   Probiotic Product (PROBIOTIC PO) Take 1 tablet by mouth daily.   No facility-administered encounter medications on file as of 08/26/2023.    REVIEW OF SYSTEMS  : All other systems reviewed and negative except where noted in the History of Present Illness.   PHYSICAL EXAM: BP 126/70 (BP Location: Left Arm, Patient Position: Sitting, Cuff Size: Large)   Pulse (!) 52   Ht 6\' 3"  (1.905 m)   Wt 248 lb (112.5 kg)   BMI 31.00 kg/m  General: Well developed male in no acute distress Head: Normocephalic and atraumatic Eyes:  Sclerae anicteric, conjunctiva pink. Ears: Normal auditory acuity Lungs: Clear throughout to auscultation; no W/R/R. Heart: Regular rate and rhythm; no M/R/G. Rectal:  Will be done at the time of his colonoscopy. Musculoskeletal: Symmetrical with no gross deformities  Skin: No lesions on visible extremities Extremities: No edema  Neurological: Alert oriented x 4, grossly non-focal Psychological:  Alert and cooperative. Normal mood and affect  ASSESSMENT AND PLAN: *Colon cancer screening:  He says that his last colonoscopy was about 10 years ago in [place].  Says that we had him sign for his records previously, but I do not see that we ever received them or that they are anywhere in our system.  Will schedule for colonoscopy Dr. Leone Payor. *GERD and belching: Is on omeprazole 20 mg daily.  Also complains of a lot of bloating.  Does not like to take a lot of medication.  Would like to have repeat EGD at  the time of his colonoscopy.  Scheduled with Dr. Leone Payor.  **The risks, benefits, and alternatives to EGD and colonoscopy were discussed with the patient and he consents to proceed.   CC:  Nche, Bonna Gains, NP

## 2023-08-26 NOTE — Patient Instructions (Signed)
 You have been scheduled for an endoscopy and colonoscopy. Please follow the written instructions given to you at your visit today.  If you use inhalers (even only as needed), please bring them with you on the day of your procedure.  DO NOT TAKE 7 DAYS PRIOR TO TEST- Trulicity (dulaglutide) Ozempic, Wegovy (semaglutide) Mounjaro (tirzepatide) Bydureon Bcise (exanatide extended release)  DO NOT TAKE 1 DAY PRIOR TO YOUR TEST Rybelsus (semaglutide) Adlyxin (lixisenatide) Victoza (liraglutide) Byetta (exanatide) ______________________________________________________________________  If your blood pressure at your visit was 140/90 or greater, please contact your primary care physician to follow up on this.  _______________________________________________________  If you are age 71 or older, your body mass index should be between 23-30. Your Body mass index is 31 kg/m. If this is out of the aforementioned range listed, please consider follow up with your Primary Care Provider.  If you are age 69 or younger, your body mass index should be between 19-25. Your Body mass index is 31 kg/m. If this is out of the aformentioned range listed, please consider follow up with your Primary Care Provider.   ________________________________________________________  The [facility] providers would like to encourage you to use [facility] to communicate with providers for non-urgent requests or questions.  Due to long hold times on the telephone, sending your provider a message by [facility] may be a faster and more efficient way to get a response.  Please allow 48 business hours for a response.  Please remember that this is for non-urgent requests.  _______________________________________________________

## 2023-08-29 ENCOUNTER — Ambulatory Visit: Admitting: Nurse Practitioner

## 2023-08-30 ENCOUNTER — Ambulatory Visit: Payer: PPO | Admitting: Orthopaedic Surgery

## 2023-08-30 ENCOUNTER — Other Ambulatory Visit (INDEPENDENT_AMBULATORY_CARE_PROVIDER_SITE_OTHER): Payer: Self-pay

## 2023-08-30 ENCOUNTER — Encounter: Payer: Self-pay | Admitting: Orthopaedic Surgery

## 2023-08-30 DIAGNOSIS — Z96652 Presence of left artificial knee joint: Secondary | ICD-10-CM

## 2023-08-30 NOTE — Progress Notes (Addendum)
 Post-Op Visit Note   Patient: Nicholas Rasmussen           Date of Birth: 02-19-1946           MRN: [id number] Visit Date: 08/30/2023 PCP: Anne Ng, NP   Assessment & Plan:  Chief Complaint:  Chief Complaint  Patient presents with   Left Knee - Follow-up    Left total knee arthroplasty 02/28/2023   Visit Diagnoses:  1. Status post total left knee replacement     Plan: Patient is a pleasant 78 year old gentleman who comes in today 34-month status post left total knee replacement 02/28/2023.  He has been doing great.  He does notice some paresthesias to the lateral knee but no pain.  Examination left knee reveals range of motion from 0 to 125 degrees.  Stable to valgus varus stress.  He is neurovascularly intact distally.  At this point, he will continue to advance with activity as tolerated.  Dental prophylaxis reinforced.  Follow-up in 6 months for repeat evaluation and 2 view x-rays of the left knee.  Call with concerns or questions.  Follow-Up Instructions: Return in about 6 months (around 02/29/2024).   Orders:  Orders Placed This Encounter  Procedures   XR Knee 1-2 Views Left   No orders of the defined types were placed in this encounter.   Imaging: XR Knee 1-2 Views Left Result Date: 08/30/2023 X-rays of the left knee show well-seated prosthesis without complication.     PMFS History: Patient Active Problem List   Diagnosis Date Noted   Belching 08/26/2023   Status post total left knee replacement 02/28/2023   Renal mass, right; Bosniak 4 complex cyst; 1 cm 09/09/2022   Vasomotor rhinitis 03/03/2022   Acute medial meniscus tear of left knee 02/04/2022   Primary osteoarthritis of left knee 02/04/2022   Aortic root dilatation (HCC) 12/18/2021   Slow transit constipation 12/08/2021   Left-sided chest wall pain 12/08/2021   Multiple pulmonary nodules 10/06/2021   Hyperlipidemia associated with type 2 diabetes mellitus (HCC) 08/19/2021   Benign paroxysmal  positional vertigo of right ear 04/07/2021   Complex renal cyst 04/07/2021   Varicose veins of both lower extremities with pain 02/02/2019   Screening for colon cancer 09/19/2018   Type 2 diabetes mellitus with diabetic polyneuropathy, without long-term current use of insulin (HCC) 03/29/2018   Diabetic neuropathy (HCC) 03/29/2018   Chronic pain of left knee 03/23/2018   Coronary artery disease involving native coronary artery of native heart with angina pectoris (HCC) 08/03/2017   Post herpetic neuralgia 07/29/2016   Dizziness 01/02/2016   Chronic midline low back pain without sciatica 06/06/2015   Nonintractable episodic headache 03/13/2015   GERD (gastroesophageal reflux disease) 11/15/2014   Seizure disorder (HCC) 05/07/2014   Allergic rhinitis 05/07/2014   Past Medical History:  Diagnosis Date   Allergic rhinitis 05/07/2014   Arthritis    Diabetes mellitus without complication (HCC)    Dizziness 01/02/2016   GERD (gastroesophageal reflux disease)    Headache 03/13/2015   Kidney lesion 07/22/2020   right, followed by urology   Left flank pain 01/02/2016   Low back pain 06/06/2015   Pain of left side of body 08/13/2015   Post herpetic neuralgia    Routine general medical examination at a health care facility 05/07/2014   Seizure disorder (HCC) 05/07/2014   Seizures (HCC)    Last seizure in the 70s.   Shingles    Stye 05/07/2014    Family History  Problem Relation Age of Onset   Other Mother        natural causes   Diabetes Father    Hypertension Father    Kidney cancer Brother    Colon cancer Neg Hx    Stomach cancer Neg Hx    Esophageal cancer Neg Hx    Colon polyps Neg Hx     Past Surgical History:  Procedure Laterality Date   COLONOSCOPY     KNEE SURGERY Left    left miniscus   TOTAL KNEE ARTHROPLASTY Left 02/28/2023   Procedure: TOTAL KNEE ARTHROPLASTY;  Surgeon: Tarry Kos, MD;  Location: [facility] OR;  Service: Orthopedics;  Laterality: Left;   UPPER  GASTROINTESTINAL ENDOSCOPY     uvala surgery     Social History   Occupational History   Occupation: Retired    Comment: Printmaker  Tobacco Use   Smoking status: Former   Smokeless tobacco: Never  Advertising account planner   Vaping status: Never Used  Substance and Sexual Activity   Alcohol use: Not Currently    Comment: occasional   Drug use: No   Sexual activity: Not on file

## 2023-08-30 NOTE — Addendum Note (Signed)
 Addended by: Mayra Reel on: 08/30/2023 01:09 PM   Modules accepted: Orders, Level of Service

## 2023-08-31 ENCOUNTER — Encounter: Payer: Self-pay | Admitting: Internal Medicine

## 2023-08-31 ENCOUNTER — Ambulatory Visit (INDEPENDENT_AMBULATORY_CARE_PROVIDER_SITE_OTHER): Admitting: Internal Medicine

## 2023-08-31 VITALS — BP 118/72 | HR 51 | Temp 98.1°F | Resp 18 | Wt 249.2 lb

## 2023-08-31 DIAGNOSIS — N644 Mastodynia: Secondary | ICD-10-CM | POA: Diagnosis not present

## 2023-08-31 DIAGNOSIS — L853 Xerosis cutis: Secondary | ICD-10-CM | POA: Diagnosis not present

## 2023-08-31 DIAGNOSIS — L918 Other hypertrophic disorders of the skin: Secondary | ICD-10-CM | POA: Diagnosis not present

## 2023-08-31 DIAGNOSIS — N62 Hypertrophy of breast: Secondary | ICD-10-CM | POA: Diagnosis not present

## 2023-08-31 NOTE — Progress Notes (Signed)
 [facility] [address] [place] [address] Dept: [phone] Dept Fax: [phone]  Acute Care Office Visit  Subjective:   Nicholas Rasmussen 07/26/1945 08/31/2023  Chief Complaint  Patient presents with   gynecomastia    Pt C/O of lump on left breast with mild discomfort when pressed. Pt also mentioned skin tag in left nostril and itchiness on mid lower back     HPI:  History of Present Illness   Nicholas Rasmussen presents with several concerns. His primary concern is a perceived change in his left breast, which he describes as feeling different and occasionally painful. He reports having been checked for this issue in the past with no significant findings, but he remains unsatisfied and concerned about the changes he has noticed. He states every so often he notices some "changes" in his breast. Denies any palpable masses or lumps.   In addition to his breast concern, Nicholas Rasmussen reports a skin tag in his nostril that appeared about a month ago. He describes it as a small growth, likening it to a "baby rhino horn," and expresses concern about potential bleeding if he attempts to remove it himself.  Nicholas Rasmussen also mentions an itchy spot on his back, at the site where a boil was lanced some time ago. He reports that the itchiness has persisted since the procedure and that applying Vaseline seems to provide some relief.        The following portions of the patient's history were reviewed and updated as appropriate: past medical history, past surgical history, family history, social history, allergies, medications, and problem list.   Patient Active Problem List   Diagnosis Date Noted   Belching 08/26/2023   Status post total left knee replacement 02/28/2023   Renal mass, right; Bosniak 4 complex cyst; 1 cm 09/09/2022   Vasomotor rhinitis 03/03/2022   Acute medial meniscus tear of left knee 02/04/2022   Primary osteoarthritis of left knee  02/04/2022   Aortic root dilatation (HCC) 12/18/2021   Slow transit constipation 12/08/2021   Left-sided chest wall pain 12/08/2021   Multiple pulmonary nodules 10/06/2021   Hyperlipidemia associated with type 2 diabetes mellitus (HCC) 08/19/2021   [name] 04/07/2021   Complex renal cyst 04/07/2021   Varicose veins of both lower extremities with pain 02/02/2019   Screening for colon cancer 09/19/2018   Type 2 diabetes mellitus with diabetic polyneuropathy, without long-term current use of insulin (HCC) 03/29/2018   Diabetic neuropathy (HCC) 03/29/2018   Chronic pain of left knee 03/23/2018   Coronary artery disease involving native coronary artery of native heart with angina pectoris (HCC) 08/03/2017   Post herpetic neuralgia 07/29/2016   Dizziness 01/02/2016   Chronic midline low back pain without sciatica 06/06/2015   Nonintractable episodic headache 03/13/2015   GERD (gastroesophageal reflux disease) 11/15/2014   Seizure disorder (HCC) 05/07/2014   Allergic rhinitis 05/07/2014   Past Medical History:  Diagnosis Date   Allergic rhinitis 05/07/2014   Arthritis    Diabetes mellitus without complication (HCC)    Dizziness 01/02/2016   GERD (gastroesophageal reflux disease)    Headache 03/13/2015   Kidney lesion 07/22/2020   right, followed by urology   Left flank pain 01/02/2016   Low back pain 06/06/2015   Pain of left side of body 08/13/2015   Post herpetic neuralgia    Routine general medical examination at a health care facility 05/07/2014   Seizure disorder (HCC) 05/07/2014   Seizures (HCC)  Last seizure in the 70s.   Shingles    Stye 05/07/2014   Past Surgical History:  Procedure Laterality Date   COLONOSCOPY     KNEE SURGERY Left    left miniscus   TOTAL KNEE ARTHROPLASTY Left 02/28/2023   Procedure: TOTAL KNEE ARTHROPLASTY;  Surgeon: Tarry Kos, MD;  Location: [facility] OR;  Service: Orthopedics;  Laterality: Left;    UPPER GASTROINTESTINAL ENDOSCOPY     uvala surgery     Family History  Problem Relation Age of Onset   Other Mother        natural causes   Diabetes Father    Hypertension Father    Kidney cancer Brother    Colon cancer Neg Hx    Stomach cancer Neg Hx    Esophageal cancer Neg Hx    Colon polyps Neg Hx     Current Outpatient Medications:    acyclovir ointment (ZOVIRAX) 5 %, Apply 1 Application topically every 6 (six) hours as needed., Disp: 30 g, Rfl: 0   aspirin EC 81 MG tablet, Take 1 tablet (81 mg total) by mouth 2 (two) times daily. To be taken after surgery to prevent blood clots (Patient taking differently: Take 81 mg by mouth daily. To be taken after surgery to prevent blood clots), Disp: 84 tablet, Rfl: 0   atorvastatin (LIPITOR) 40 MG tablet, Take 1 tablet (40 mg total) by mouth every evening., Disp: 90 tablet, Rfl: 2   Cholecalciferol (VITAMIN D-3) 125 MCG (5000 UT) TABS, Take 5,000 Units by mouth daily., Disp: , Rfl:    Collagen-Vitamin C-Biotin (COLLAGEN PO), Take 1 Dose by mouth daily., Disp: , Rfl:    divalproex (DEPAKOTE ER) 500 MG 24 hr tablet, Take 1 tablet (500 mg total) by mouth at bedtime., Disp: 90 tablet, Rfl: 3   gabapentin (NEURONTIN) 300 MG capsule, TAKE 1 CAPSULE BY MOUTH EVERYDAY AT BEDTIME, Disp: 90 capsule, Rfl: 3   glipiZIDE (GLUCOTROL XL) 5 MG 24 hr tablet, TAKE 1 TABLET BY MOUTH EVERY DAY WITH BREAKFAST, Disp: 90 tablet, Rfl: 3   ipratropium (ATROVENT) 0.03 % nasal spray, Place 2 sprays into both nostrils daily., Disp: , Rfl:    meclizine (ANTIVERT) 25 MG tablet, Take 1 tablet (25 mg total) by mouth 3 (three) times daily as needed for dizziness., Disp: 30 tablet, Rfl: 0   methocarbamol (ROBAXIN-750) 750 MG tablet, Take 1 tablet (750 mg total) by mouth 2 (two) times daily as needed for muscle spasms., Disp: 20 tablet, Rfl: 2   metoprolol tartrate (LOPRESSOR) 25 MG tablet, Take 0.5 tablets (12.5 mg total) by mouth daily., Disp: 45 tablet, Rfl: 1   Na  Sulfate-K Sulfate-Mg Sulfate concentrate (SUPREP) 17.5-3.13-1.6 GM/177ML SOLN, SMARTSIG:1 Kit(s) By Mouth Once, Disp: , Rfl:    nitroGLYCERIN (NITROSTAT) 0.4 MG SL tablet, Place 1 tablet (0.4 mg total) under the tongue every 5 (five) minutes as needed for chest pain., Disp: 30 tablet, Rfl: 0   omeprazole (PRILOSEC) 20 MG capsule, Take 1 capsule (20 mg total) by mouth daily., Disp: 90 capsule, Rfl: 3   OVER THE COUNTER MEDICATION, Multi collagen 1 scoop in the am, Disp: , Rfl:    Probiotic Product (PROBIOTIC PO), Take 1 tablet by mouth daily., Disp: , Rfl:  Allergies  Allergen Reactions   Jardiance [Empagliflozin] Other (See Comments)    Yeast infection     ROS: A complete ROS was performed with pertinent positives/negatives noted in the HPI. The remainder of the ROS are negative.  Objective:   Today's Vitals   08/31/23 1546  BP: 118/72  Pulse: (!) 51  Resp: 18  Temp: 98.1 F (36.7 C)  TempSrc: Temporal  SpO2: 99%  Weight: 249 lb 3.2 oz (113 kg)  PainSc: 0-No pain    GENERAL: Well-appearing, in NAD. Well nourished.  SKIN: Pink, warm and dry. Skin tag to left nare. Keloid with dry skin to mid back NECK: Trachea midline. Full ROM w/o pain or tenderness. No lymphadenopathy.  RESPIRATORY: Chest wall symmetrical. Respirations even and non-labored. Breath sounds clear to auscultation bilaterally.  CARDIAC: S1, S2 present, regular rate and rhythm. Peripheral pulses 2+ bilaterally.  BREASTS: Breast tissue enlarged, symmetrical, and w/o palpable masses. Gynecomastia present. Nipples everted and w/o discharge. No rash or skin retraction. No axillary or supraclavicular lymphadenopathy.  MSK: Muscle tone and strength appropriate for age.  EXTREMITIES: Without clubbing, cyanosis, or edema.  NEUROLOGIC: No motor or sensory deficits. Steady, even gait.  PSYCH/MENTAL STATUS: Alert, oriented x 3. Cooperative, appropriate mood and affect.    No results found for any visits on 08/31/23.     Assessment & Plan:  Assessment and Plan    Breast discomfort and Gynecomastia  Intermittent left breast discomfort with no palpable abnormalities. Previous evaluations normal. Reassured him of current findings. - Encourage follow-up if symptoms persist or worsen.  Dry skin  - Apply hydrocortisone cream as needed for itching. - Apply Vaseline after hydrocortisone to retain moisture.  Nasal skin tag Small nasal skin tag present for one month. Concern about potential bleeding if manipulated. Discussed over-the-counter removal options. - Advise cautious use of over-the-counter skin tag removal liquid with Q-tip application.       No orders of the defined types were placed in this encounter.  No orders of the defined types were placed in this encounter.  Lab Orders  No laboratory test(s) ordered today   No images are attached to the encounter or orders placed in the encounter.  Return for Scheduled Routine Office Visits and as needed.   Salvatore Decent, FNP

## 2023-08-31 NOTE — Patient Instructions (Addendum)
 Dry skin on back: can use over the counter hydrocortisone 1% cream , then apply vaseline as needed   Skin tag on nose : can use over the counter skin tag removal kit

## 2023-09-21 ENCOUNTER — Encounter: Payer: Self-pay | Admitting: Internal Medicine

## 2023-09-26 ENCOUNTER — Telehealth: Payer: Self-pay | Admitting: Nurse Practitioner

## 2023-09-26 NOTE — Telephone Encounter (Signed)
 Patient dropped off document Handicap Placard, to be filled out by provider. Patient requested to send it back via Call Patient to pick up within 7-days. Document is located in providers tray at front office.Please advise at Mobile [phone] (mobile)   Pt walked in with forms to be filled out.  I put in the dr box

## 2023-09-27 DIAGNOSIS — Z0279 Encounter for issue of other medical certificate: Secondary | ICD-10-CM

## 2023-09-27 NOTE — Telephone Encounter (Signed)
 CLINICAL USE BELOW THIS LINE (use X to signify taken)  ____Form received and placed in providers office for signature. ____Form completed and faxed to LOA Dept. __X__Form completed & patient called to notify paperwork is ready for pick up ___X_Charge sheet & copy of form in front office folder for office supervisor.

## 2023-09-27 NOTE — Telephone Encounter (Signed)
 Called patient to inform him that forms are completed and ready for him to pick up. Patient stated that he will stop by the office tomorrow (Wednesday 09/28/23) or Thursday (09/29/23) to pick up from the front office

## 2023-09-27 NOTE — Telephone Encounter (Signed)
CLINICAL USE BELOW THIS LINE (use X to signify taken)  __X__Form received and placed in providers office for signature. ____Form completed and faxed to LOA Dept. ____Form completed & LVM to notify pt ready for pick up ____Charge sheet & copy of form in front office folder for office supervisor.   

## 2023-09-28 ENCOUNTER — Ambulatory Visit: Admitting: Internal Medicine

## 2023-09-28 ENCOUNTER — Encounter: Payer: Self-pay | Admitting: Internal Medicine

## 2023-09-28 VITALS — BP 148/70 | HR 49 | Temp 97.3°F | Resp 12 | Ht 75.0 in | Wt 248.0 lb

## 2023-09-28 DIAGNOSIS — D123 Benign neoplasm of transverse colon: Secondary | ICD-10-CM

## 2023-09-28 DIAGNOSIS — D128 Benign neoplasm of rectum: Secondary | ICD-10-CM | POA: Diagnosis not present

## 2023-09-28 DIAGNOSIS — K2100 Gastro-esophageal reflux disease with esophagitis, without bleeding: Secondary | ICD-10-CM | POA: Diagnosis not present

## 2023-09-28 DIAGNOSIS — D125 Benign neoplasm of sigmoid colon: Secondary | ICD-10-CM | POA: Diagnosis not present

## 2023-09-28 DIAGNOSIS — Z1211 Encounter for screening for malignant neoplasm of colon: Secondary | ICD-10-CM | POA: Diagnosis not present

## 2023-09-28 DIAGNOSIS — D124 Benign neoplasm of descending colon: Secondary | ICD-10-CM

## 2023-09-28 DIAGNOSIS — D127 Benign neoplasm of rectosigmoid junction: Secondary | ICD-10-CM | POA: Diagnosis not present

## 2023-09-28 MED ORDER — SODIUM CHLORIDE 0.9 % IV SOLN: 500.0000 mL | Freq: Once | INTRAVENOUS | Status: DC

## 2023-09-28 NOTE — Op Note (Signed)
 [facility] Patient Name: Nicholas Rasmussen Procedure Date: 09/28/2023 2:16 PM MRN: [id number] Endoscopist: Kenney Peacemaker , MD, [id number] Age: 78 Referring MD:  Date of Birth: 1945/07/06 Gender: Male Account #: [id number] Procedure:                Upper GI endoscopy Indications:              Gastro-esophageal reflux disease, Follow-up of                            gastro-esophageal reflux disease Medicines:                Monitored Anesthesia Care Procedure:                Pre-Anesthesia Assessment:                           - Prior to the procedure, a History and Physical                            was performed, and patient medications and                            allergies were reviewed. The patient's tolerance of                            previous anesthesia was also reviewed. The risks                            and benefits of the procedure and the sedation                            options and risks were discussed with the patient.                            All questions were answered, and informed consent                            was obtained. Prior Anticoagulants: The patient has                            taken no anticoagulant or antiplatelet agents. ASA                            Grade Assessment: III - A patient with severe                            systemic disease. After reviewing the risks and                            benefits, the patient was deemed in satisfactory                            condition to undergo the procedure.  After obtaining informed consent, the endoscope was                            passed under direct vision. Throughout the                            procedure, the patient's blood pressure, pulse, and                            oxygen saturations were monitored continuously. The                            GIF [id number] #[id number] was introduced through the                            mouth, and advanced to  the second part of duodenum.                            The upper GI endoscopy was accomplished without                            difficulty. The patient tolerated the procedure                            well. Scope In: Scope Out: Findings:                 The esophagus was normal.                           The stomach was normal.                           The examined duodenum was normal.                           The cardia and gastric fundus were normal on                            retroflexion. Complications:            No immediate complications. Estimated Blood Loss:     Estimated blood loss: none. Impression:               - Normal esophagus.                           - Normal stomach.                           - Normal examined duodenum.                           - No specimens collected. Recommendation:           - Patient has a contact number available for  emergencies. The signs and symptoms of potential                            delayed complications were discussed with the                            patient. Return to normal activities tomorrow.                            Written discharge instructions were provided to the                            patient.                           - GERD prevention diet.                           - Continue present medications.                           - See the other procedure note for documentation of                            additional recommendations. Kenney Peacemaker, MD 09/28/2023 3:01:52 PM This report has been signed electronically.

## 2023-09-28 NOTE — Progress Notes (Signed)
 History and Physical Interval Note:  09/28/2023 2:17 PM  Nicholas Rasmussen  has presented today for endoscopic procedure(s), with the diagnosis of  Encounter Diagnoses  Name Primary?   Screening for colon cancer Yes   Gastroesophageal reflux disease with esophagitis without hemorrhage   .  The various methods of evaluation and treatment have been discussed with the patient and/or family. After consideration of risks, benefits and other options for treatment, the patient has consented to  the endoscopic procedure(s).   The patient's history has been reviewed, patient examined, no change in status, stable for endoscopic procedure(s).  I have reviewed the patient's chart and labs.  Questions were answered to the patient's satisfaction.     Kenney Peacemaker, MD, [facility]

## 2023-09-28 NOTE — Patient Instructions (Addendum)
 Upper endoscopy was normal.  Four tiny polyps removed from the colonoscopy.  They will be analyzed and I will let you know my recommendations.  I appreciate the opportunity to care for you. Kenney Peacemaker, MD, [facility]  Discharge instructions given. Handouts on polyps and Gerd. Resume previous medications. YOU HAD AN ENDOSCOPIC PROCEDURE TODAY AT THE [facility]:   Refer to the procedure report that was given to you for any specific questions about what was found during the examination.  If the procedure report does not answer your questions, please call your gastroenterologist to clarify.  If you requested that your care partner not be given the details of your procedure findings, then the procedure report has been included in a sealed envelope for you to review at your convenience later.  YOU SHOULD EXPECT: Some feelings of bloating in the abdomen. Passage of more gas than usual.  Walking can help get rid of the air that was put into your GI tract during the procedure and reduce the bloating. If you had a lower endoscopy (such as a colonoscopy or flexible sigmoidoscopy) you may notice spotting of blood in your stool or on the toilet paper. If you underwent a bowel prep for your procedure, you may not have a normal bowel movement for a few days.  Please Note:  You might notice some irritation and congestion in your nose or some drainage.  This is from the oxygen used during your procedure.  There is no need for concern and it should clear up in a day or so.  SYMPTOMS TO REPORT IMMEDIATELY:  Following lower endoscopy (colonoscopy or flexible sigmoidoscopy):  Excessive amounts of blood in the stool  Significant tenderness or worsening of abdominal pains  Swelling of the abdomen that is new, acute  Fever of 100F or higher  Following upper endoscopy (EGD)  Vomiting of blood or coffee ground material  New chest pain or pain under the shoulder blades  Painful or persistently  difficult swallowing  New shortness of breath  Fever of 100F or higher  Black, tarry-looking stools  For urgent or emergent issues, a gastroenterologist can be reached at any hour by calling [phone]. Do not use MyChart messaging for urgent concerns.    DIET:  We do recommend a small meal at first, but then you may proceed to your regular diet.  Drink plenty of fluids but you should avoid alcoholic beverages for 24 hours.  ACTIVITY:  You should plan to take it easy for the rest of today and you should NOT DRIVE or use heavy machinery until tomorrow (because of the sedation medicines used during the test).    FOLLOW UP: Our staff will call the number listed on your records the next business day following your procedure.  We will call around 7:15- 8:00 am to check on you and address any questions or concerns that you may have regarding the information given to you following your procedure. If we do not reach you, we will leave a message.     If any biopsies were taken you will be contacted by phone or by letter within the next 1-3 weeks.  Please call us  at [phone] if you have not heard about the biopsies in 3 weeks.    SIGNATURES/CONFIDENTIALITY: You and/or your care partner have signed paperwork which will be entered into your electronic medical record.  These signatures attest to the fact that that the information above on your After Visit Summary has been  reviewed and is understood.  Full responsibility of the confidentiality of this discharge information lies with you and/or your care-partner.

## 2023-09-28 NOTE — Progress Notes (Signed)
 Pt's states no medical or surgical changes since previsit or office visit.

## 2023-09-28 NOTE — Op Note (Signed)
 [facility] Patient Name: Nicholas Rasmussen Procedure Date: 09/28/2023 2:16 PM MRN: [id number] Endoscopist: Kenney Peacemaker , MD, [id number] Age: 78 Referring MD:  Date of Birth: February 23, 1946 Gender: Male Account #: [id number] Procedure:                Colonoscopy Indications:              Screening for colorectal malignant neoplasm, Last                            colonoscopy: 2015 Medicines:                Monitored Anesthesia Care Procedure:                Pre-Anesthesia Assessment:                           - Prior to the procedure, a History and Physical                            was performed, and patient medications and                            allergies were reviewed. The patient's tolerance of                            previous anesthesia was also reviewed. The risks                            and benefits of the procedure and the sedation                            options and risks were discussed with the patient.                            All questions were answered, and informed consent                            was obtained. Prior Anticoagulants: The patient has                            taken no anticoagulant or antiplatelet agents. ASA                            Grade Assessment: III - A patient with severe                            systemic disease. After reviewing the risks and                            benefits, the patient was deemed in satisfactory                            condition to undergo the procedure.  After obtaining informed consent, the colonoscope                            was passed under direct vision. Throughout the                            procedure, the patient's blood pressure, pulse, and                            oxygen saturations were monitored continuously. The                            CF [id number] #[id number] was introduced through the anus                            and advanced to the the cecum,  identified by                            appendiceal orifice and ileocecal valve. The                            colonoscopy was performed without difficulty. The                            patient tolerated the procedure well. The quality                            of the bowel preparation was good. The ileocecal                            valve, appendiceal orifice, and rectum were                            photographed. Scope In: 2:35:37 PM Scope Out: 2:57:05 PM Scope Withdrawal Time: 0 hours 17 minutes 3 seconds  Total Procedure Duration: 0 hours 21 minutes 28 seconds  Findings:                 The perianal and digital rectal examinations were                            normal.                           Four sessile polyps were found in the proximal                            rectum, sigmoid colon, descending colon and                            transverse colon. The polyps were diminutive in                            size. These polyps were removed with a cold snare.  Resection and retrieval were complete. Verification                            of patient identification for the specimen was                            done. Estimated blood loss was minimal.                           The exam was otherwise without abnormality on                            direct and retroflexion views. Complications:            No immediate complications. Estimated Blood Loss:     Estimated blood loss was minimal. Impression:               - Four diminutive polyps in the proximal rectum, in                            the sigmoid colon, in the descending colon and in                            the transverse colon, removed with a cold snare.                            Resected and retrieved.                           - The examination was otherwise normal on direct                            and retroflexion views. Recommendation:           - Patient has a contact number  available for                            emergencies. The signs and symptoms of potential                            delayed complications were discussed with the                            patient. Return to normal activities tomorrow.                            Written discharge instructions were provided to the                            patient.                           - GERD prevention diet.                           - Continue present medications.                           -  Await pathology results.                           - No recommendation at this time regarding repeat                            colonoscopy due to age. Kenney Peacemaker, MD 09/28/2023 3:05:35 PM This report has been signed electronically.

## 2023-09-28 NOTE — Progress Notes (Signed)
 Called to room to assist during endoscopic procedure.  Patient ID and intended procedure confirmed with present staff. Received instructions for my participation in the procedure from the performing physician.

## 2023-09-28 NOTE — Progress Notes (Signed)
 Vss nad trans to pacu

## 2023-09-29 ENCOUNTER — Telehealth: Payer: Self-pay

## 2023-09-29 NOTE — Telephone Encounter (Signed)
  Follow up Call-     09/28/2023    1:26 PM  Call back number  Post procedure Call Back phone  # [phone]  Permission to leave phone message Yes     Patient questions:  Do you have a fever, pain , or abdominal swelling? No. Pain Score  0 *  Have you tolerated food without any problems? Yes.    Have you been able to return to your normal activities? Yes.    Do you have any questions about your discharge instructions: Diet   No. Medications  No. Follow up visit  No.  Do you have questions or concerns about your Care? No.  Actions: * If pain score is 4 or above: No action needed, pain <4.

## 2023-10-03 LAB — SURGICAL PATHOLOGY

## 2023-10-04 ENCOUNTER — Encounter: Payer: Self-pay | Admitting: Internal Medicine

## 2023-10-04 ENCOUNTER — Ambulatory Visit: Payer: Self-pay | Admitting: Internal Medicine

## 2023-10-04 DIAGNOSIS — Z860101 Personal history of adenomatous and serrated colon polyps: Secondary | ICD-10-CM

## 2023-10-04 DIAGNOSIS — E119 Type 2 diabetes mellitus without complications: Secondary | ICD-10-CM | POA: Diagnosis not present

## 2023-10-04 HISTORY — DX: Personal history of adenomatous and serrated colon polyps: Z86.0101

## 2023-10-04 NOTE — Telephone Encounter (Signed)
 This encounter was created in error - please disregard.

## 2023-11-15 DIAGNOSIS — D49511 Neoplasm of unspecified behavior of right kidney: Secondary | ICD-10-CM | POA: Diagnosis not present

## 2023-11-22 ENCOUNTER — Encounter: Payer: Self-pay | Admitting: Internal Medicine

## 2023-11-22 ENCOUNTER — Ambulatory Visit: Admitting: Internal Medicine

## 2023-11-22 VITALS — BP 116/68 | HR 52 | Temp 97.9°F | Ht 75.0 in | Wt 252.2 lb

## 2023-11-22 DIAGNOSIS — R202 Paresthesia of skin: Secondary | ICD-10-CM

## 2023-11-22 DIAGNOSIS — R21 Rash and other nonspecific skin eruption: Secondary | ICD-10-CM

## 2023-11-22 DIAGNOSIS — R200 Anesthesia of skin: Secondary | ICD-10-CM | POA: Diagnosis not present

## 2023-11-22 NOTE — Progress Notes (Unsigned)
 [facility] PRIMARY [facility]ARE [facility] [address] [place] [address] Dept: [phone] Dept Fax: [phone]  Acute Care Office Visit  Subjective:   Nicholas Rasmussen [birth year] 11/22/2023  Chief Complaint  Patient presents with   Tingling    Right leg numbness come and goes     HPI: Corvin Sorbo is a 78 yo M who presents complaining of intermittent RLE tingling and numbness onset couple months ago. Reports tingling sensation is present to the lateral apsect of RLE thigh and sometimes . associated discomfort. Nothing worsens, Nothing improves. Comes and goes on its own. Does report having lower back problems in the past.   Did PT for chronic back pain which did help back pain but has not gone recently.    When slouching worsens pain.  The following portions of the patient's history were reviewed and updated as appropriate: past medical history, past surgical history, family history, social history, allergies, medications, and problem list.   Patient Active Problem List   Diagnosis Date Noted   Hx of adenomatous colonic polyps 10/04/2023   Belching 08/26/2023   Status post total left knee replacement 02/28/2023   Renal mass, right; Bosniak 4 complex cyst; 1 cm 09/09/2022   Vasomotor rhinitis 03/03/2022   Acute medial meniscus tear of left knee 02/04/2022   Primary osteoarthritis of left knee 02/04/2022   Aortic root dilatation (HCC) 12/18/2021   Slow transit constipation 12/08/2021   Left-sided chest wall pain 12/08/2021   Multiple pulmonary nodules 10/06/2021   Hyperlipidemia associated with type 2 diabetes mellitus (HCC) 08/19/2021   [name] 04/07/2021   Complex renal cyst 04/07/2021   Varicose veins of both lower extremities with pain 02/02/2019   Screening for colon cancer 09/19/2018   Type 2 diabetes mellitus with diabetic polyneuropathy, without long-term current use of insulin  (HCC)  03/29/2018   Diabetic neuropathy (HCC) 03/29/2018   Chronic pain of left knee 03/23/2018   Coronary artery disease involving native coronary artery of native heart with angina pectoris (HCC) 08/03/2017   Post herpetic neuralgia 07/29/2016   Dizziness 01/02/2016   Chronic midline low back pain without sciatica 06/06/2015   Nonintractable episodic headache 03/13/2015   GERD (gastroesophageal reflux disease) 11/15/2014   Seizure disorder (HCC) 05/07/2014   Allergic rhinitis 05/07/2014   Past Medical History:  Diagnosis Date   Allergic rhinitis 05/07/2014   Arthritis    Diabetes mellitus without complication (HCC)    Dizziness 01/02/2016   GERD (gastroesophageal reflux disease)    Headache 03/13/2015   Hx of adenomatous colonic polyps 10/04/2023   Kidney lesion 07/22/2020   right, followed by urology   Left flank pain 01/02/2016   Low back pain 06/06/2015   Pain of left side of body 08/13/2015   Post herpetic neuralgia    Routine general medical examination at a health care facility 05/07/2014   Seizure disorder (HCC) 05/07/2014   Seizures (HCC)    Last seizure in the 70s.   Shingles    Stye 05/07/2014   Past Surgical History:  Procedure Laterality Date   COLONOSCOPY     KNEE SURGERY Left    left miniscus   TOTAL KNEE ARTHROPLASTY Left 02/28/2023   Procedure: TOTAL KNEE ARTHROPLASTY;  Surgeon: Jerri Kay HERO, MD;  Location: [facility] OR;  Service: Orthopedics;  Laterality: Left;   UPPER GASTROINTESTINAL ENDOSCOPY     uvala surgery     Family History  Problem Relation Age of Onset   Other  Mother        natural causes   Diabetes Father    Hypertension Father    Kidney cancer Brother    Colon cancer Neg Hx    Stomach cancer Neg Hx    Esophageal cancer Neg Hx    Colon polyps Neg Hx    Rectal cancer Neg Hx     Current Outpatient Medications:    acyclovir  ointment (ZOVIRAX ) 5 %, Apply 1 Application topically every 6 (six) hours as needed., Disp: 30 g, Rfl: 0   aspirin  EC 81  MG tablet, Take 1 tablet (81 mg total) by mouth 2 (two) times daily. To be taken after surgery to prevent blood clots (Patient taking differently: Take 81 mg by mouth daily. To be taken after surgery to prevent blood clots), Disp: 84 tablet, Rfl: 0   atorvastatin  (LIPITOR) 40 MG tablet, Take 1 tablet (40 mg total) by mouth every evening., Disp: 90 tablet, Rfl: 2   Cholecalciferol (VITAMIN D-3) 125 MCG (5000 UT) TABS, Take 5,000 Units by mouth daily., Disp: , Rfl:    clotrimazole  (LOTRIMIN ) 1 % cream, Apply topically., Disp: , Rfl:    Collagen-Vitamin C-Biotin (COLLAGEN PO), Take 1 Dose by mouth daily., Disp: , Rfl:    divalproex  (DEPAKOTE  ER) 500 MG 24 hr tablet, Take 1 tablet (500 mg total) by mouth at bedtime., Disp: 90 tablet, Rfl: 3   gabapentin  (NEURONTIN ) 300 MG capsule, TAKE 1 CAPSULE BY MOUTH EVERYDAY AT BEDTIME, Disp: 90 capsule, Rfl: 3   glipiZIDE  (GLUCOTROL  XL) 5 MG 24 hr tablet, TAKE 1 TABLET BY MOUTH EVERY DAY WITH BREAKFAST, Disp: 90 tablet, Rfl: 3   ipratropium (ATROVENT) 0.03 % nasal spray, Place 2 sprays into both nostrils daily., Disp: , Rfl:    meclizine  (ANTIVERT ) 25 MG tablet, Take 1 tablet (25 mg total) by mouth 3 (three) times daily as needed for dizziness., Disp: 30 tablet, Rfl: 0   methocarbamol  (ROBAXIN -750) 750 MG tablet, Take 1 tablet (750 mg total) by mouth 2 (two) times daily as needed for muscle spasms., Disp: 20 tablet, Rfl: 2   metoprolol  tartrate (LOPRESSOR ) 25 MG tablet, Take 0.5 tablets (12.5 mg total) by mouth daily., Disp: 45 tablet, Rfl: 1   nitroGLYCERIN  (NITROSTAT ) 0.4 MG SL tablet, Place 1 tablet (0.4 mg total) under the tongue every 5 (five) minutes as needed for chest pain., Disp: 30 tablet, Rfl: 0   omeprazole  (PRILOSEC) 20 MG capsule, Take 1 capsule (20 mg total) by mouth daily., Disp: 90 capsule, Rfl: 3   OVER THE COUNTER MEDICATION, Multi collagen 1 scoop in the am, Disp: , Rfl:    Probiotic Product (PROBIOTIC PO), Take 1 tablet by mouth daily., Disp: ,  Rfl:  Allergies  Allergen Reactions   Jardiance  [Empagliflozin ] Other (See Comments)    Yeast infection     ROS: A complete ROS was performed with pertinent positives/negatives noted in the HPI. The remainder of the ROS are negative.    Objective:   Today's Vitals   11/22/23 1536  BP: 116/68  Pulse: (!) 52  Temp: 97.9 F (36.6 C)  TempSrc: Temporal  SpO2: 97%  Weight: 252 lb 3.2 oz (114.4 kg)  Height: 6' 3 (1.905 m)    GENERAL: Well-appearing, in NAD. Well nourished.  SKIN: Pink, warm and dry. No rash, lesion, ulceration, or ecchymoses.  NECK: Trachea midline. Full ROM w/o pain or tenderness. No lymphadenopathy.  RESPIRATORY: Chest wall symmetrical. Respirations even and non-labored. Breath sounds clear to auscultation bilaterally.  CARDIAC: S1, S2 present, regular rate and rhythm. Peripheral pulses 2+ bilaterally.  MSK: Muscle tone and strength appropriate for age. Joints w/o tenderness, redness, or swelling. EXTREMITIES: Without clubbing, cyanosis, or edema.  NEUROLOGIC: No motor or sensory deficits. Steady, even gait.  PSYCH/MENTAL STATUS: Alert, oriented x 3. Cooperative, appropriate mood and affect.    No results found for any visits on 11/22/23.    Assessment & Plan:  Assessment and Plan Assessment & Plan       There are no diagnoses linked to this encounter. No orders of the defined types were placed in this encounter.  No orders of the defined types were placed in this encounter.  Lab Orders  No laboratory test(s) ordered today   No images are attached to the encounter or orders placed in the encounter.  No follow-ups on file.   Rosina Senters, FNP

## 2023-11-22 NOTE — Patient Instructions (Signed)
 Rash: Hydrocortisone cream , then use moisturizer (Cerave, Cetaphil)   Tingling in leg: Call orthopedics for appointment . Will need Xray's and likely physical therapy

## 2023-11-29 ENCOUNTER — Encounter: Payer: Self-pay | Admitting: Orthopaedic Surgery

## 2023-11-29 ENCOUNTER — Other Ambulatory Visit (INDEPENDENT_AMBULATORY_CARE_PROVIDER_SITE_OTHER): Payer: Self-pay

## 2023-11-29 ENCOUNTER — Ambulatory Visit: Admitting: Orthopaedic Surgery

## 2023-11-29 DIAGNOSIS — M79604 Pain in right leg: Secondary | ICD-10-CM | POA: Diagnosis not present

## 2023-11-29 DIAGNOSIS — M5416 Radiculopathy, lumbar region: Secondary | ICD-10-CM

## 2023-11-29 MED ORDER — PREDNISONE 10 MG (21) PO TBPK: ORAL_TABLET | ORAL | 3 refills | Status: DC

## 2023-11-29 NOTE — Progress Notes (Signed)
 Office Visit Note   Patient: Nicholas Rasmussen           Date of Birth: 05-01-1946           MRN: [id number] Visit Date: 11/29/2023              Requested by: Katheen Roselie Rockford, NP [address] [place],  [place] [address] PCP: Katheen Roselie Rockford, NP   Assessment & Plan: Visit Diagnoses:  1. Pain in right leg   2. Radiculopathy, lumbar region     Plan: History of Present Illness Nicholas Rasmussen is a 78 year old male who presents with right leg pain and a burning sensation down the lateral thigh.  He has experienced these symptoms for seven months, with pain worsening when lying down and accompanied by low back pain. Numbness occurs in the leg, particularly when lying down and flexing the leg. Despite these symptoms, he continues to dance. He has undergone knee replacement surgery, which he is satisfied with, and reports improved knee tightness over time. He acknowledges poor posture, often slumping when sitting, which may contribute to his back discomfort.  Physical Exam MUSCULOSKELETAL: Lumbar spine shows degenerative changes.  Assessment and Plan Sciatica Right leg pain with burning sensation down the lateral thigh for seven months, likely due to a pinched nerve. Symptoms suggestive of sciatica. - Prescribed prednisone  for one week to reduce inflammation. - Referred to physical therapy for symptom management.  Degenerative changes in lumbar spine Lumbar spine shows degenerative changes consistent with aging. Poor posture may contribute to symptoms. - Advised on maintaining good posture to alleviate symptoms.  Follow-Up Instructions: No follow-ups on file.   Orders:  Orders Placed This Encounter  Procedures   XR Lumbar Spine 2-3 Views   Ambulatory referral to Physical Therapy   Meds ordered this encounter  Medications   predniSONE  (STERAPRED UNI-PAK 21 TAB) 10 MG (21) TBPK tablet    Sig: Take as directed    Dispense:  21 tablet    Refill:  3       Procedures: No procedures performed   Clinical Data: No additional findings.   Subjective: Chief Complaint  Patient presents with   Right Leg - Pain    HPI  Review of Systems  Constitutional: Negative.   HENT: Negative.    Eyes: Negative.   Respiratory: Negative.    Cardiovascular: Negative.   Gastrointestinal: Negative.   Endocrine: Negative.   Genitourinary: Negative.   Skin: Negative.   Allergic/Immunologic: Negative.   Neurological: Negative.   Hematological: Negative.   Psychiatric/Behavioral: Negative.    All other systems reviewed and are negative.    Objective: Vital Signs: There were no vitals taken for this visit.  Physical Exam Vitals and nursing note reviewed.  Constitutional:      Appearance: He is well-developed.  HENT:     Head: Normocephalic and atraumatic.  Eyes:     Pupils: Pupils are equal, round, and reactive to light.  Pulmonary:     Effort: Pulmonary effort is normal.  Abdominal:     Palpations: Abdomen is soft.  Musculoskeletal:        General: Normal range of motion.     Cervical back: Neck supple.  Skin:    General: Skin is warm.  Neurological:     Mental Status: He is alert and oriented to person, place, and time.  Psychiatric:        Behavior: Behavior normal.        Thought Content:  Thought content normal.        Judgment: Judgment normal.     Ortho Exam  Specialty Comments:  No specialty comments available.  Imaging: XR Lumbar Spine 2-3 Views Result Date: 11/29/2023 X-rays of the lumbar spine show degenerative changes.  No acute abnormalities.    PMFS History: Patient Active Problem List   Diagnosis Date Noted   Hx of adenomatous colonic polyps 10/04/2023   Belching 08/26/2023   Status post total left knee replacement 02/28/2023   Renal mass, right; Bosniak 4 complex cyst; 1 cm 09/09/2022   Vasomotor rhinitis 03/03/2022   Acute medial meniscus tear of left knee 02/04/2022   Primary osteoarthritis of left  knee 02/04/2022   Aortic root dilatation (HCC) 12/18/2021   Slow transit constipation 12/08/2021   Left-sided chest wall pain 12/08/2021   Multiple pulmonary nodules 10/06/2021   Hyperlipidemia associated with type 2 diabetes mellitus (HCC) 08/19/2021   [name] 04/07/2021   Complex renal cyst 04/07/2021   Varicose veins of both lower extremities with pain 02/02/2019   Screening for colon cancer 09/19/2018   Type 2 diabetes mellitus with diabetic polyneuropathy, without long-term current use of insulin  (HCC) 03/29/2018   Diabetic neuropathy (HCC) 03/29/2018   Chronic pain of left knee 03/23/2018   Coronary artery disease involving native coronary artery of native heart with angina pectoris (HCC) 08/03/2017   Post herpetic neuralgia 07/29/2016   Dizziness 01/02/2016   Chronic midline low back pain without sciatica 06/06/2015   Nonintractable episodic headache 03/13/2015   GERD (gastroesophageal reflux disease) 11/15/2014   Seizure disorder (HCC) 05/07/2014   Allergic rhinitis 05/07/2014   Past Medical History:  Diagnosis Date   Allergic rhinitis 05/07/2014   Arthritis    Diabetes mellitus without complication (HCC)    Dizziness 01/02/2016   GERD (gastroesophageal reflux disease)    Headache 03/13/2015   Hx of adenomatous colonic polyps 10/04/2023   Kidney lesion 07/22/2020   right, followed by urology   Left flank pain 01/02/2016   Low back pain 06/06/2015   Pain of left side of body 08/13/2015   Post herpetic neuralgia    Routine general medical examination at a health care facility 05/07/2014   Seizure disorder (HCC) 05/07/2014   Seizures (HCC)    Last seizure in the 70s.   Shingles    Stye 05/07/2014    Family History  Problem Relation Age of Onset   Other Mother        natural causes   Diabetes Father    Hypertension Father    Kidney cancer Brother    Colon cancer Neg Hx    Stomach cancer Neg Hx    Esophageal cancer Neg  Hx    Colon polyps Neg Hx    Rectal cancer Neg Hx     Past Surgical History:  Procedure Laterality Date   COLONOSCOPY     KNEE SURGERY Left    left miniscus   TOTAL KNEE ARTHROPLASTY Left 02/28/2023   Procedure: TOTAL KNEE ARTHROPLASTY;  Surgeon: Jerri Kay HERO, MD;  Location: [facility] OR;  Service: Orthopedics;  Laterality: Left;   UPPER GASTROINTESTINAL ENDOSCOPY     uvala surgery     Social History   Occupational History   Occupation: Retired    Comment: Printmaker  Tobacco Use   Smoking status: Former    Current packs/day: 0.00    Types: Cigarettes    Quit date: 1992    Years since quitting: 33.5  Smokeless tobacco: Never  Vaping Use   Vaping status: Never Used  Substance and Sexual Activity   Alcohol use: Not Currently    Comment: occasional   Drug use: No   Sexual activity: Not on file

## 2023-11-30 DIAGNOSIS — K7689 Other specified diseases of liver: Secondary | ICD-10-CM | POA: Diagnosis not present

## 2023-11-30 DIAGNOSIS — D49511 Neoplasm of unspecified behavior of right kidney: Secondary | ICD-10-CM | POA: Diagnosis not present

## 2023-11-30 DIAGNOSIS — N281 Cyst of kidney, acquired: Secondary | ICD-10-CM | POA: Diagnosis not present

## 2023-11-30 DIAGNOSIS — N289 Disorder of kidney and ureter, unspecified: Secondary | ICD-10-CM | POA: Diagnosis not present

## 2023-12-08 ENCOUNTER — Ambulatory Visit: Payer: Self-pay

## 2023-12-08 ENCOUNTER — Ambulatory Visit (INDEPENDENT_AMBULATORY_CARE_PROVIDER_SITE_OTHER): Admitting: Family Medicine

## 2023-12-08 VITALS — BP 122/70 | HR 54 | Temp 98.1°F | Ht 75.0 in | Wt 250.6 lb

## 2023-12-08 DIAGNOSIS — K219 Gastro-esophageal reflux disease without esophagitis: Secondary | ICD-10-CM | POA: Diagnosis not present

## 2023-12-08 DIAGNOSIS — L989 Disorder of the skin and subcutaneous tissue, unspecified: Secondary | ICD-10-CM | POA: Diagnosis not present

## 2023-12-08 DIAGNOSIS — R058 Other specified cough: Secondary | ICD-10-CM | POA: Diagnosis not present

## 2023-12-08 MED ORDER — FLUTICASONE PROPIONATE 50 MCG/ACT NA SUSP: 2.0000 | Freq: Every day | NASAL | 6 refills | Status: DC

## 2023-12-08 MED ORDER — ESOMEPRAZOLE MAGNESIUM 40 MG PO CPDR: 40.0000 mg | DELAYED_RELEASE_CAPSULE | Freq: Every day | ORAL | 3 refills | Status: DC

## 2023-12-08 NOTE — Telephone Encounter (Signed)
 FYI Only or Action Required?: FYI only for provider.  Patient was last seen in primary care on 11/22/2023 by Billy Knee, FNP.  Called Nurse Triage reporting Cough.  Symptoms began several weeks ago.  Interventions attempted: Nothing.  Symptoms are: unchanged.  Triage Disposition: No disposition on file.  Patient/caregiver understands and will follow disposition?:        Copied from CRM [id number]. Topic: Clinical - Red Word Triage >> Dec 08, 2023 12:06 PM Jayma L wrote: Red Word that prompted transfer to Nurse Triage: patient has a dry cough with some shortness of breath  or getting to that point and he said he has some chest discomfort asking for a appt please Reason for Disposition  [1] MILD difficulty breathing (e.g., minimal/no SOB at rest, SOB with walking, pulse < 100) AND [2] still present when not coughing  Answer Assessment - Initial Assessment Questions 1. ONSET: When did the cough begin?      I'm not sure 2. SEVERITY: How bad is the cough today?      mild 3. SPUTUM: Describe the color of your sputum (e.g., none, dry cough; clear, white, yellow, green)     yellow 4. HEMOPTYSIS: Are you coughing up any blood? If Yes, ask: How much? (e.g., flecks, streaks, tablespoons, etc.)     no 5. DIFFICULTY BREATHING: Are you having difficulty breathing? If Yes, ask: How bad is it? (e.g., mild, moderate, severe)      mild 6. FEVER: Do you have a fever? If Yes, ask: What is your temperature, how was it measured, and when did it start?     no 7. CARDIAC HISTORY: Do you have any history of heart disease? (e.g., heart attack, congestive heart failure)      no 8. LUNG HISTORY: Do you have any history of lung disease?  (e.g., pulmonary embolus, asthma, emphysema)     no 9. PE RISK FACTORS: Do you have a history of blood clots? (or: recent major surgery, recent prolonged travel, bedridden)     no 10. OTHER SYMPTOMS: Do you have any other symptoms? (e.g.,  runny nose, wheezing, chest pain)       Chest discomfort, states not something that is alarming 11. PREGNANCY: Is there any chance you are pregnant? When was your last menstrual period?       na 12. TRAVEL: Have you traveled out of the country in the last month? (e.g., travel history, exposures)       no  Protocols used: Cough - Acute Non-Productive-A-AH

## 2023-12-08 NOTE — Progress Notes (Signed)
 Assessment & Plan   Assessment/Plan:        Upper airway chronic cough Chronic cough likely secondary to acid reflux, possibly with a laryngeal component. Differential diagnosis includes postnasal drip secondary to allergies. - Prescribe esomeprazole  40 mg in the morning, 30 minutes to 1 hour before breakfast - Add fluticasone  50 mcg, two sprays per nostril daily - Refer to ENT for evaluation of skin tag in left nostril - Follow up in one month to assess improvement in cough and reflux symptoms - Discuss ED precautions - Follow up on results of recent chest x-ray  Gastroesophageal reflux disease (GERD) Uncontrolled GERD with symptoms of heartburn and chest pressure, exacerbated by dietary habits and late-night eating. Symptoms suggestive of laryngeal reflux contributing to chronic cough. Decision to switch from omeprazole  to esomeprazole  due to increased potency. Lifestyle modifications emphasized to reduce symptoms. - Prescribe esomeprazole  40 mg in the morning, 30 minutes to 1 hour before breakfast - Advise lifestyle modifications: avoid eating late, reduce intake of alcohol, caffeine , chocolate, and cigarettes - Recommend elevating the head of the bed to prevent reflux - Discontinue current omeprazole   Heart disease Previous myocardial infarction. Current symptoms do not suggest cardiac origin, but cardiac issues cannot be completely ruled out. Importance of monitoring for any worsening chest pain, especially with exertion, was discussed. - Monitor for any worsening chest pain, especially with exertion - Discuss ED precautions  Shingles Residual symptoms around the ear, possibly contributing to vertigo.  General Health Maintenance Lifestyle modifications to manage weight and improve overall health were discussed. - Encourage weight loss and healthy eating habits  Follow-up Plan to assess treatment efficacy and review test results. - Schedule follow-up appointment in one  month - Review results of chest x-ray on December 15, 2023          Medications Discontinued During This Encounter  Medication Reason   omeprazole  (PRILOSEC) 20 MG capsule     Return in about 1 month (around 01/08/2024) for cough.        Subjective:   Encounter date: 12/08/2023  Nicholas Rasmussen is a 78 y.o. male who has Seizure disorder (HCC); Allergic rhinitis; GERD (gastroesophageal reflux disease); Nonintractable episodic headache; Chronic midline low back pain without sciatica; Dizziness; Post herpetic neuralgia; Coronary artery disease involving native coronary artery of native heart with angina pectoris (HCC); Chronic pain of left knee; Type 2 diabetes mellitus with diabetic polyneuropathy, without long-term current use of insulin  (HCC); Diabetic neuropathy (HCC); Screening for colon cancer; Varicose veins of both lower extremities with pain; [name]; Complex renal cyst; Hyperlipidemia associated with type 2 diabetes mellitus (HCC); Multiple pulmonary nodules; Slow transit constipation; Left-sided chest wall pain; Aortic root dilatation (HCC); Acute medial meniscus tear of left knee; Primary osteoarthritis of left knee; Renal mass, right; Bosniak 4 complex cyst; 1 cm; Vasomotor rhinitis; Status post total left knee replacement; Belching; and Hx of adenomatous colonic polyps on their problem list..   He  has a past medical history of Allergic rhinitis (05/07/2014), Arthritis, Diabetes mellitus without complication (HCC), Dizziness (01/02/2016), GERD (gastroesophageal reflux disease), Headache (03/13/2015), adenomatous colonic polyps (10/04/2023), Kidney lesion (07/22/2020), Left flank pain (01/02/2016), Low back pain (06/06/2015), Pain of left side of body (08/13/2015), Post herpetic neuralgia, Routine general medical examination at a health care facility (05/07/2014), Seizure disorder (HCC) (05/07/2014), Seizures (HCC), Shingles, and Stye  (05/07/2014).[name]   He presents with chief complaint of Cough (Dry Crackling cough for months, right ear stopped up) .  Discussed the use of AI scribe software for clinical note transcription with the patient, who gave verbal consent to proceed.  History of Present Illness   Nicholas Rasmussen is a 78 year old male with severe acid reflux who presents with a chronic cough.  He has been experiencing a dry, crackling cough primarily in the mornings for the past couple of months. He describes a sensation of 'something laying on my chest' and attributes it to mucus drainage at night. The cough does not persist throughout the day. No wheezing or chest pain associated with physical activity.  He has a history of severe acid reflux, which he believes is exacerbated by his dietary habits. He is currently taking a low dose of omeprazole  for heartburn. Eating late at night and lying down soon after contributes to his symptoms. He experiences frequent belching, which provides some relief from the pressure in his chest. He tends to eat late while watching TV, which he believes contributes to his reflux symptoms. He has made some dietary changes, such as reducing chocolate intake.  He has a history of heart disease and diabetes. He mentions a history of shingles and is currently taking gabapentin  for associated symptoms around his ear. He experiences a sensation of something in his ear, which he finds annoying.  He has previously had a chest x-ray two weeks ago at [facility], with results pending.          Past Surgical History:  Procedure Laterality Date   COLONOSCOPY     KNEE SURGERY Left    left miniscus   TOTAL KNEE ARTHROPLASTY Left 02/28/2023   Procedure: TOTAL KNEE ARTHROPLASTY;  Surgeon: Jerri Kay HERO, MD;  Location: [facility] OR;  Service: Orthopedics;  Laterality: Left;   UPPER GASTROINTESTINAL ENDOSCOPY     uvala surgery      Outpatient Medications Prior to Visit  Medication Sig Dispense  Refill   acyclovir  ointment (ZOVIRAX ) 5 % Apply 1 Application topically every 6 (six) hours as needed. 30 g 0   aspirin  EC 81 MG tablet Take 1 tablet (81 mg total) by mouth 2 (two) times daily. To be taken after surgery to prevent blood clots (Patient taking differently: Take 81 mg by mouth daily. To be taken after surgery to prevent blood clots) 84 tablet 0   atorvastatin  (LIPITOR) 40 MG tablet Take 1 tablet (40 mg total) by mouth every evening. 90 tablet 2   Cholecalciferol (VITAMIN D-3) 125 MCG (5000 UT) TABS Take 5,000 Units by mouth daily.     clotrimazole  (LOTRIMIN ) 1 % cream Apply topically.     Collagen-Vitamin C-Biotin (COLLAGEN PO) Take 1 Dose by mouth daily.     divalproex  (DEPAKOTE  ER) 500 MG 24 hr tablet Take 1 tablet (500 mg total) by mouth at bedtime. 90 tablet 3   gabapentin  (NEURONTIN ) 300 MG capsule TAKE 1 CAPSULE BY MOUTH EVERYDAY AT BEDTIME 90 capsule 3   glipiZIDE  (GLUCOTROL  XL) 5 MG 24 hr tablet TAKE 1 TABLET BY MOUTH EVERY DAY WITH BREAKFAST 90 tablet 3   ipratropium (ATROVENT) 0.03 % nasal spray Place 2 sprays into both nostrils daily.     meclizine  (ANTIVERT ) 25 MG tablet Take 1 tablet (25 mg total) by mouth 3 (three) times daily as needed for dizziness. 30 tablet 0   methocarbamol  (ROBAXIN -750) 750 MG tablet Take 1 tablet (750 mg total) by mouth 2 (two) times daily as needed for muscle spasms. 20 tablet 2   metoprolol  tartrate (LOPRESSOR ) 25 MG tablet Take  0.5 tablets (12.5 mg total) by mouth daily. 45 tablet 1   nitroGLYCERIN  (NITROSTAT ) 0.4 MG SL tablet Place 1 tablet (0.4 mg total) under the tongue every 5 (five) minutes as needed for chest pain. 30 tablet 0   OVER THE COUNTER MEDICATION Multi collagen 1 scoop in the am     predniSONE  (STERAPRED UNI-PAK 21 TAB) 10 MG (21) TBPK tablet Take as directed 21 tablet 3   Probiotic Product (PROBIOTIC PO) Take 1 tablet by mouth daily.     omeprazole  (PRILOSEC) 20 MG capsule Take 1 capsule (20 mg total) by mouth daily. 90 capsule  3   No facility-administered medications prior to visit.    Family History  Problem Relation Age of Onset   Other Mother        natural causes   Diabetes Father    Hypertension Father    Kidney cancer Brother    Colon cancer Neg Hx    Stomach cancer Neg Hx    Esophageal cancer Neg Hx    Colon polyps Neg Hx    Rectal cancer Neg Hx     Social History   Socioeconomic History   Marital status: Single    Spouse name: Not on file   Number of children: 3   Years of education: Associates   Highest education level: Not on file  Occupational History   Occupation: Retired    Comment: Printmaker  Tobacco Use   Smoking status: Former    Current packs/day: 0.00    Types: Cigarettes    Quit date: 1992    Years since quitting: 33.5   Smokeless tobacco: Never  Vaping Use   Vaping status: Never Used  Substance and Sexual Activity   Alcohol use: Not Currently    Comment: occasional   Drug use: No   Sexual activity: Not on file  Other Topics Concern   Not on file  Social History Narrative   ** Merged History Encounter **       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 Right-handed. Rarely drinks caffeine .    Social Drivers of Corporate investment banker Strain: Low Risk  (08/12/2023)   Overall Financial Resource Strain (CARDIA)    Difficulty of Paying Living Expenses: Not hard at all  Food Insecurity: No Food Insecurity (08/12/2023)   Hunger Vital Sign    Worried About Running Out of Food in the Last Year: Never true    Ran Out of Food in the Last Year: Never true  Transportation Needs: No Transportation Needs (08/12/2023)   PRAPARE - Administrator, Civil Service (Medical): No    Lack of Transportation (Non-Medical): No  Physical Activity: Sufficiently Active (08/12/2023)   Exercise Vital Sign    Days of Exercise per Week: 6 days    Minutes of Exercise per Session:  150+ min  Stress: No Stress Concern Present (08/12/2023)   [employer] of Occupational Health - Occupational Stress Questionnaire    Feeling of Stress : Not at all  Social Connections: Socially Integrated (08/12/2023)   Social Connection and Isolation Panel    Frequency of Communication with Friends and Family: More than three times a week    Frequency of Social Gatherings with Friends and Family: Twice a week    Attends Religious Services: More than 4 times per year    Active Member of [employer] or Organizations: Yes  Attends Banker Meetings: More than 4 times per year    Marital Status: Married  Catering manager Violence: Not At Risk (08/12/2023)   Humiliation, Afraid, Rape, and Kick questionnaire    Fear of Current or Ex-Partner: No    Emotionally Abused: No    Physically Abused: No    Sexually Abused: No                                                                                                  Objective:  Physical Exam: BP 122/70 (BP Location: Left Arm, Patient Position: Sitting, Cuff Size: Large)   Pulse (!) 54   Temp 98.1 F (36.7 C)   Ht 6' 3 (1.905 m)   Wt 250 lb 9.6 oz (113.7 kg)   SpO2 97%   BMI 31.32 kg/m    Physical Exam  Physical Exam   GENERAL: Alert, cooperative, well developed, no acute distress. HEENT: Normocephalic, normal oropharynx, moist mucous membranes. CHEST: Clear to auscultation bilaterally, no wheezes, rhonchi, or crackles. CARDIOVASCULAR: Normal heart rate and rhythm, S1 and S2 normal without murmurs. ABDOMEN: Soft, non-tender, non-distended, without organomegaly, normal bowel sounds. EXTREMITIES: No cyanosis or edema. NEUROLOGICAL: Cranial nerves grossly intact, moves all extremities without gross motor or sensory deficit.       Physical Exam  XR Lumbar Spine 2-3 Views Result Date: 11/29/2023 X-rays of the lumbar spine show degenerative changes.  No acute abnormalities.   Recent Results (from the past 2160 hours)   Surgical pathology ([facility])     Status: None   Collection Time: 09/28/23 12:00 AM  Result Value Ref Range   SURGICAL PATHOLOGY      SURGICAL PATHOLOGY [facility] [address], [address] [place], [place] [address] Telephone [phone] or [phone] Fax [phone]  REPORT OF SURGICAL PATHOLOGY   Accession #: [id number] Patient Name: Nicholas Rasmussen, Nicholas Rasmussen Visit # : [id number]  MRN: [id number] Physician: Avram Pitts DOB/Age [birth year] (Age: 44) Gender: M Collected Date: 09/28/2023 Received Date: 09/30/2023  FINAL DIAGNOSIS       1. Surgical [P], colon, sigmoid, descending, and transverse, rectum, polyp (4) :       -  TUBULAR ADENOMA, FRAGMENTS.       DATE SIGNED OUT: 10/03/2023 ELECTRONIC SIGNATURE : Legolvan Do, Mark, Pathologist, Electronic Signature  MICROSCOPIC DESCRIPTION  CASE COMMENTS STAINS USED IN DIAGNOSIS: H&E    CLINICAL HISTORY  SPECIMEN(S) OBTAINED 1. Surgical [P], Colon, Sigmoid, Descending, And Transverse, Rectum, Polyp (4)  SPECIMEN COMMENTS: 1. Screening for colon cancer; gastroesophageal reflux disease with esophagitis without hemor rhage; benign neoplasm of transverse colon; benign neoplasm of descending colon; benign neoplasm of sigmoid colon; benign neoplasm of rectum and anal canal SPECIMEN CLINICAL INFORMATION: 1. R/O adenoma    Gross Description 1. Received in formalin are tan, soft tissue fragments that are submitted in toto. Number: 4, Size: 0.3 cm smallest to 1.1 cm largest, (1B) ( TA )        Report signed out from the following location(s) . [facility] [address] [place], [place] [address] CLIA #:  [id number]  [facility] [address] [place], [place] [address] CLIA #: [id number]         Beverley Adine Hummer, MD, MS

## 2023-12-08 NOTE — Patient Instructions (Addendum)
  VISIT SUMMARY: Today, we discussed your chronic cough and its likely connection to your acid reflux. We also reviewed your history of heart disease, diabetes, and shingles. We made some changes to your medications and discussed lifestyle modifications to help manage your symptoms.  YOUR PLAN: -CHRONIC COUGH: Your chronic cough is likely due to your acid reflux, which may also be affecting your throat. We have prescribed esomeprazole  40 mg to take in the morning before breakfast and added fluticasone  nasal spray to help with any nasal symptoms. We are also referring you to an ENT specialist to evaluate a skin tag in your left nostril. Please follow up in one month to see how your symptoms are improving and to discuss the results of your recent chest x-ray.  -GASTROESOPHAGEAL REFLUX DISEASE (GERD): GERD is a condition where stomach acid frequently flows back into the tube connecting your mouth and stomach, causing heartburn and other symptoms. We have switched your medication from omeprazole  to esomeprazole  40 mg for better control of your symptoms. It's important to avoid eating late at night, reduce your intake of alcohol, caffeine , chocolate, and cigarettes, and elevate the head of your bed to prevent reflux. Please stop taking omeprazole .  -HEART DISEASE: You have a history of heart disease, and while your current symptoms do not suggest a heart problem, it's important to monitor for any worsening chest pain, especially with exertion. We discussed the precautions to take in case of an emergency.  -SHINGLES: You have residual symptoms around your ear from shingles, which may be contributing to a sensation in your ear. Continue taking gabapentin  as prescribed.  -GENERAL HEALTH MAINTENANCE: We discussed the importance of lifestyle modifications to manage your weight and improve your overall health. Please focus on healthy eating habits and weight loss.  INSTRUCTIONS: Please schedule a follow-up  appointment in one month to assess how your treatment is working and to review the results of your chest x-ray, which will be available on December 15, 2023.

## 2023-12-08 NOTE — Telephone Encounter (Signed)
 Sent to [facility] in error patient not affiliated with our office. Please route to appropriate office

## 2023-12-09 ENCOUNTER — Other Ambulatory Visit: Payer: Self-pay | Admitting: Nurse Practitioner

## 2023-12-09 DIAGNOSIS — K219 Gastro-esophageal reflux disease without esophagitis: Secondary | ICD-10-CM

## 2023-12-09 NOTE — Telephone Encounter (Signed)
 Copied from CRM [id number]. Topic: Clinical - Medication Refill >> Dec 09, 2023 12:19 PM Arm[place] J wrote: Medication: esomeprazole  (NEXIUM ) 40 MG capsule  Has the patient contacted their pharmacy? Yes (Agent: If no, request that the patient contact the pharmacy for the refill. If patient does not wish to contact the pharmacy document the reason why and proceed with request.) (Agent: If yes, when and what did the pharmacy advise?) Pharmacy is calling in to do the refill request.  This is the patient's preferred pharmacy:  [facility] - [place], [place] - [address] [place] [address] Phone: [phone] Fax: [phone] Hours: Not open 24 hours  Is this the correct pharmacy for this prescription? Yes If no, delete pharmacy and type the correct one.   Has the prescription been filled recently? No  Is the patient out of the medication? No  Has the patient been seen for an appointment in the last year OR does the patient have an upcoming appointment? Yes  Can we respond through MyChart? Yes  Agent: Please be advised that Rx refills may take up to 3 business days. We ask that you follow-up with your pharmacy.

## 2023-12-12 ENCOUNTER — Telehealth: Payer: Self-pay

## 2023-12-12 NOTE — Telephone Encounter (Signed)
 LVM for pt to cb. Can possibly get scheduled with [name], PA today for cough if no other alarming symptoms (reports chest discomfort).[name]

## 2023-12-12 NOTE — Telephone Encounter (Signed)
 Returned call to patient and informed him that our office did receive a fax from [facility] informing that he is wanting to switch to that pharmacy. I also informed him that Roselie wanted me to call him to confirm this before call in the [facility] to [facility]. He thanked me for calling and stated that he also changed his insurance that he will bring in his new card when he comes to his September appointment with [facility]. Patient verbalized understanding and all questions (if any) were answered.   Called [facility] and gave verbal for medications prescribed by Roselie Mood, NP

## 2023-12-12 NOTE — Telephone Encounter (Signed)
 Copied from CRM [id number]. Topic: General - Other >> Dec 12, 2023  4:20 PM Rosina BIRCH wrote: Reason for CRM: patient called stating he want to change his pharmacy to [facility]   [phone] Fax-[phone] CB for patient [phone]

## 2023-12-15 DIAGNOSIS — N281 Cyst of kidney, acquired: Secondary | ICD-10-CM | POA: Diagnosis not present

## 2023-12-19 ENCOUNTER — Encounter: Payer: Self-pay | Admitting: Physical Therapy

## 2023-12-19 ENCOUNTER — Ambulatory Visit (INDEPENDENT_AMBULATORY_CARE_PROVIDER_SITE_OTHER): Admitting: Physical Therapy

## 2023-12-19 DIAGNOSIS — R262 Difficulty in walking, not elsewhere classified: Secondary | ICD-10-CM

## 2023-12-19 DIAGNOSIS — M6281 Muscle weakness (generalized): Secondary | ICD-10-CM | POA: Diagnosis not present

## 2023-12-19 DIAGNOSIS — M79604 Pain in right leg: Secondary | ICD-10-CM

## 2023-12-19 DIAGNOSIS — M5459 Other low back pain: Secondary | ICD-10-CM

## 2023-12-19 NOTE — Therapy (Signed)
 OUTPATIENT PHYSICAL THERAPY LOWER EXTREMITY EVALUATION   Patient Name: Nicholas Rasmussen MRN: [id number] DOB:12-May-1946, 78 y.o., male Today's Date: 12/19/2023  END OF SESSION:  PT End of Session - 12/19/23 1224     Visit Number 1    Number of Visits 20    Date for PT Re-Evaluation 03/02/24    Progress Note Due on Visit 10    PT Start Time 0930    PT Stop Time 1010    PT Time Calculation (min) 40 min    Activity Tolerance Patient tolerated treatment well    Behavior During Therapy [facility] for tasks assessed/performed          Past Medical History:  Diagnosis Date   Allergic rhinitis 05/07/2014   Arthritis    Diabetes mellitus without complication (HCC)    Dizziness 01/02/2016   GERD (gastroesophageal reflux disease)    Headache 03/13/2015   Hx of adenomatous colonic polyps 10/04/2023   Kidney lesion 07/22/2020   right, followed by urology   Left flank pain 01/02/2016   Low back pain 06/06/2015   Pain of left side of body 08/13/2015   Post herpetic neuralgia    Routine general medical examination at a health care facility 05/07/2014   Seizure disorder (HCC) 05/07/2014   Seizures (HCC)    Last seizure in the 70s.   Shingles    Stye 05/07/2014   Past Surgical History:  Procedure Laterality Date   COLONOSCOPY     KNEE SURGERY Left    left miniscus   TOTAL KNEE ARTHROPLASTY Left 02/28/2023   Procedure: TOTAL KNEE ARTHROPLASTY;  Surgeon: Jerri Kay HERO, MD;  Location: [facility] OR;  Service: Orthopedics;  Laterality: Left;   UPPER GASTROINTESTINAL ENDOSCOPY     uvala surgery     Patient Active Problem List   Diagnosis Date Noted   Hx of adenomatous colonic polyps 10/04/2023   Belching 08/26/2023   Status post total left knee replacement 02/28/2023   Renal mass, right; Bosniak 4 complex cyst; 1 cm 09/09/2022   Vasomotor rhinitis 03/03/2022   Acute medial meniscus tear of left knee 02/04/2022   Primary osteoarthritis of left knee 02/04/2022   Aortic root dilatation (HCC)  12/18/2021   Slow transit constipation 12/08/2021   Left-sided chest wall pain 12/08/2021   Multiple pulmonary nodules 10/06/2021   Hyperlipidemia associated with type 2 diabetes mellitus (HCC) 08/19/2021   [name] 04/07/2021   Complex renal cyst 04/07/2021   Varicose veins of both lower extremities with pain 02/02/2019   Screening for colon cancer 09/19/2018   Type 2 diabetes mellitus with diabetic polyneuropathy, without long-term current use of insulin  (HCC) 03/29/2018   Diabetic neuropathy (HCC) 03/29/2018   Chronic pain of left knee 03/23/2018   Coronary artery disease involving native coronary artery of native heart with angina pectoris (HCC) 08/03/2017   Post herpetic neuralgia 07/29/2016   Dizziness 01/02/2016   Chronic midline low back pain without sciatica 06/06/2015   Nonintractable episodic headache 03/13/2015   GERD (gastroesophageal reflux disease) 11/15/2014   Seizure disorder (HCC) 05/07/2014   Allergic rhinitis 05/07/2014    PCP: Katheen Roselie [place]ford, NP   REFERRING PROVIDER: Jerri Kay HERO, MD   REFERRING DIAG:  Diagnosis  M79.604 (ICD-10-CM) - Pain in right leg  M54.16 (ICD-10-CM) - Radiculopathy, lumbar region    THERAPY DIAG:  Other low back pain  Difficulty in walking, not elsewhere classified  Muscle weakness (generalized)  Pain in right leg  Rationale for  Evaluation and Treatment: Rehabilitation  ONSET DATE: 7-8 months  SUBJECTIVE:   SUBJECTIVE STATEMENT: Pt arriving reporting pain down his Rt LE for the last 7-8 months, with pain worsening when lying down and accompanied by low back pain. Numbness occurs in the leg, particularly when lying down and flexing the leg. Despite these symptoms, he continues to dance. He has undergone knee replacement surgery, which he is satisfied with, and reports improved knee tightness over time. He acknowledges poor posture, often slumping when sitting, which may  contribute to his back discomfort.   PERTINENT HISTORY: DM, seizure disorder, arthritis, see PMH above  PAIN:  NPRS scale: 5/10 Pain location: low back and down Rt LE Pain description: achy, concentrated in low back mostly Aggravating factors: leaning and moving in certain positions Relieving factors: changing positions  PRECAUTIONS: None  WEIGHT BEARING RESTRICTIONS: No  FALLS:  Has patient fallen in last 6 months? No  LIVING ENVIRONMENT: Lives with: lives with their family and lives with their spouse Lives in: House/apartment Stairs: Yes: External: 15 steps; bilateral but cannot reach both Has following equipment at home: None  OCCUPATION: retired  PLOF: Independent  PATIENT GOALS: dance without pain  Next MD visit:   OBJECTIVE:   DIAGNOSTIC FINDINGS: Result Narrative 11/29/23  X-rays of the lumbar spine show degenerative changes.  No acute abnormalities.  PATIENT SURVEYS:  Patient-Specific Activity Scoring Scheme  0 represents "unable to perform." 10 represents "able to perform at prior level. 0 1 2 3 4 5 6 7 8 9  10 (Date and Score)   Activity Eval  12/19/23     1. dancing  6    2. Lifting items from floor (watermelon)  4    3. Stairs navigation 5   4.    5.    Score 5    Total score = sum of the activity scores/number of activities Minimum detectable change (90%CI) for average score = 2 points Minimum detectable change (90%CI) for single activity score = 3 points  COGNITION: Overall cognitive status: WFL    SENSATION: Numbness and tingling down Rt LE into lateral thigh to knee   POSTURE:  rounded shoulders and forward head    LOWER EXTREMITY ROM:   ROM Right Eval 12/19/23  Left eval  Hip flexion 115 115  Hip extension    Hip abduction    Hip adduction    Hip internal rotation    Hip external rotation    Knee flexion    Knee extension    Ankle dorsiflexion    Ankle plantarflexion    Ankle inversion    Ankle eversion      (Blank rows = not tested)   LUMBAR ROM:   ROM AROM  eval  Flexion 70 c pain in low back  Extension 10 c pain  Right lateral flexion 22 c pain  Left lateral flexion 25  Right rotation WFL  Left rotation WFL   (Blank rows = not tested)    LOWER EXTREMITY MMT:  MMT Right Eval 12/19/23 Left eval  Hip flexion 40.3 44.5  Hip extension    Hip abduction    Hip adduction    Hip internal rotation    Hip external rotation    Knee flexion 63.8 65.4  Knee extension 53.1 60.2  Ankle dorsiflexion    Ankle plantarflexion    Ankle inversion    Ankle eversion     (Blank rows = not tested)  SPECIAL TESTS:  12/19/23 Slump test: positive on  Rt   FUNCTIONAL TESTS:  12/19/23: 5 time sit to stand: 13.75 seconds no UE support  GAIT: Distance walked: clinic distances Assistive device utilized: None Level of assistance: Complete Independence Comments: mild antalgic gait pattern with wide BOS                                                                                                                                                                        TODAY'S TREATMENT                                                                          DATE: 12/19/23 Therex:  HEP instruction/performance c cues for techniques, handout provided.  Trial set performed of each for comprehension and symptom assessment.  See below for exercise list Self Care:  Sitting position edu to keep knees lower than hips with sitting and when driving a car Sleeping positions discussed and recommended   PATIENT EDUCATION:  Education details: HEP, POC Person educated: Patient Education method: Explanation, Demonstration, Verbal cues, and Handouts Education comprehension: verbalized understanding, returned demonstration, and verbal cues required  HOME EXERCISE PROGRAM: Access Code: [email or web address] URL: [email or web address] Date: 12/19/2023 Prepared by: Delon Lunger  Exercises - Standing  Quadratus Lumborum Stretch with Doorway  - 1 x daily - 7 x weekly - 5 reps - 10 seconds hold - Standing Hip Hiking  - 2 x daily - 7 x weekly - 2 sets - 10 reps - Hooklying Single Knee to Chest Stretch  - 2 x daily - 7 x weekly - 3 reps - 20 seconds hold - Supine Lower Trunk Rotation  - 2 x daily - 7 x weekly - 3 reps - 20 seconds hold  ASSESSMENT:  CLINICAL IMPRESSION: Patient is a 78 y.o. who comes to clinic with complaints of pain down right LE with mobility, strength and movement coordination deficits that impair their ability to perform usual daily and recreational functional activities without increase difficulty/symptoms at this time.  Patient to benefit from skilled PT services to address impairments and limitations to improve to previous level of function without restriction secondary to condition.   OBJECTIVE IMPAIRMENTS: Abnormal gait, difficulty walking, decreased ROM, decreased strength, impaired flexibility, postural dysfunction, and pain.   ACTIVITY LIMITATIONS: bending, sitting, squatting, sleeping, and dancing  PARTICIPATION LIMITATIONS: driving and community activity  PERSONAL FACTORS: see PMH and pertinent history above are also affecting patient's functional outcome.   REHAB POTENTIAL: Good  CLINICAL DECISION MAKING: Stable/uncomplicated  EVALUATION COMPLEXITY: Low   GOALS: Goals reviewed with patient? Yes  SHORT TERM GOALS: (target date for Short term goals are 3 weeks 01/09/2024)   1.  Patient will demonstrate independent use of home exercise program to maintain progress from in clinic treatments. Goal status: New  2. Pt will improve his 5 time sit to stand to </= 10 seconds with no UE support.   Goal status: New  LONG TERM GOALS: (target dates for all long term goals are 10 weeks 03/02/2024 )   1. Patient will demonstrate/report pain at worst less than or equal to 2/10 to facilitate minimal limitation in daily activity secondary to pain symptoms. Goal  status: New   2. Patient will demonstrate independent use of home exercise program to facilitate ability to maintain/progress functional gains from skilled physical therapy services.  Goal status: New   3. Patient will demonstrate Patient specific functional scale avg > or = 7 to indicate reduced disability due to condition.  Goal status: New   4.  Patient will demonstrate Rt LE quad strength will increase by >/= 5 pounds throughout to faciltiate usual transfers, stairs, squatting at PLOF for daily life.  Goal status: New   5.  Patient will demonstrate up and down 1 flight of stairs with single hand rail with pain </= 2/10.  Goal status: New   6.  Pt will reporting return to dancing with pain </= 2/10.  Goal status: New    PLAN:  PT FREQUENCY: 1-2x/week  PT DURATION: 10 weeks  PLANNED INTERVENTIONS: Can include 02853- PT Re-evaluation, 97110-Therapeutic exercises, 97530- Therapeutic activity, [id number]- Neuromuscular re-education, 97535- Self Care, 97140- Manual therapy, [phone]- Gait training, [phone]- Orthotic Fit/training, [phone]- Canalith repositioning, [id number]- Aquatic Therapy, [phone]- Electrical stimulation (unattended), [id number] Physical performance testing, 97016- Vasopneumatic device, [id number]- Ultrasound, [id number]- Traction (mechanical), [id number]- Ionotophoresis 4mg /ml Dexamethasone ,  [id number] - Needle insertion w/o injection 1 or 2 muscles, [id number] - Needle insertion w/o injection 3 or more muscles.   Patient/Family education, Balance training, Stair training, Taping, Dry Needling, Joint mobilization, Joint manipulation, Spinal manipulation, Spinal mobilization, Scar mobilization, Vestibular training, Visual/preceptual remediation/compensation, DME instructions, Cryotherapy, and Moist heat.  All performed as medically necessary.  All included unless contraindicated  PLAN FOR NEXT SESSION: Review HEP knowledge/results, LE strengthening, lumbar mobs, core strengthening, lumbar mobs if  needed    Delon JONELLE Lunger, PT, MPT 12/19/2023, 12:25 PM

## 2023-12-21 DIAGNOSIS — Z20822 Contact with and (suspected) exposure to covid-19: Secondary | ICD-10-CM | POA: Diagnosis not present

## 2023-12-27 ENCOUNTER — Encounter: Payer: Self-pay | Admitting: Physical Therapy

## 2023-12-27 ENCOUNTER — Ambulatory Visit: Admitting: Physical Therapy

## 2023-12-27 DIAGNOSIS — M79604 Pain in right leg: Secondary | ICD-10-CM | POA: Diagnosis not present

## 2023-12-27 DIAGNOSIS — R262 Difficulty in walking, not elsewhere classified: Secondary | ICD-10-CM | POA: Diagnosis not present

## 2023-12-27 DIAGNOSIS — M5459 Other low back pain: Secondary | ICD-10-CM | POA: Diagnosis not present

## 2023-12-27 DIAGNOSIS — M6281 Muscle weakness (generalized): Secondary | ICD-10-CM | POA: Diagnosis not present

## 2023-12-27 NOTE — Therapy (Signed)
 OUTPATIENT PHYSICAL THERAPY LOWER EXTREMITY   Patient Name: Nicholas Rasmussen MRN: [id number] DOB:[birth year], 78 y.o., male Today's Date: 12/27/2023  END OF SESSION:  PT End of Session - 12/27/23 1609     Visit Number 2    Number of Visits 20    Date for PT Re-Evaluation 03/02/24    PT Start Time 1604    PT Stop Time 1644    PT Time Calculation (min) 40 min    Behavior During Therapy [facility] for tasks assessed/performed           Past Medical History:  Diagnosis Date   Allergic rhinitis 05/07/2014   Arthritis    Diabetes mellitus without complication (HCC)    Dizziness 01/02/2016   GERD (gastroesophageal reflux disease)    Headache 03/13/2015   Hx of adenomatous colonic polyps 10/04/2023   Kidney lesion 07/22/2020   right, followed by urology   Left flank pain 01/02/2016   Low back pain 06/06/2015   Pain of left side of body 08/13/2015   Post herpetic neuralgia    Routine general medical examination at a health care facility 05/07/2014   Seizure disorder (HCC) 05/07/2014   Seizures (HCC)    Last seizure in the 70s.   Shingles    Stye 05/07/2014   Past Surgical History:  Procedure Laterality Date   COLONOSCOPY     KNEE SURGERY Left    left miniscus   TOTAL KNEE ARTHROPLASTY Left 02/28/2023   Procedure: TOTAL KNEE ARTHROPLASTY;  Surgeon: Jerri Kay HERO, MD;  Location: [facility] OR;  Service: Orthopedics;  Laterality: Left;   UPPER GASTROINTESTINAL ENDOSCOPY     uvala surgery     Patient Active Problem List   Diagnosis Date Noted   Hx of adenomatous colonic polyps 10/04/2023   Belching 08/26/2023   Status post total left knee replacement 02/28/2023   Renal mass, right; Bosniak 4 complex cyst; 1 cm 09/09/2022   Vasomotor rhinitis 03/03/2022   Acute medial meniscus tear of left knee 02/04/2022   Primary osteoarthritis of left knee 02/04/2022   Aortic root dilatation (HCC) 12/18/2021   Slow transit constipation 12/08/2021   Left-sided chest wall pain 12/08/2021   Multiple  pulmonary nodules 10/06/2021   Hyperlipidemia associated with type 2 diabetes mellitus (HCC) 08/19/2021   [name] 04/07/2021   Complex renal cyst 04/07/2021   Varicose veins of both lower extremities with pain 02/02/2019   Screening for colon cancer 09/19/2018   Type 2 diabetes mellitus with diabetic polyneuropathy, without long-term current use of insulin  (HCC) 03/29/2018   Diabetic neuropathy (HCC) 03/29/2018   Chronic pain of left knee 03/23/2018   Coronary artery disease involving native coronary artery of native heart with angina pectoris (HCC) 08/03/2017   Post herpetic neuralgia 07/29/2016   Dizziness 01/02/2016   Chronic midline low back pain without sciatica 06/06/2015   Nonintractable episodic headache 03/13/2015   GERD (gastroesophageal reflux disease) 11/15/2014   Seizure disorder (HCC) 05/07/2014   Allergic rhinitis 05/07/2014    PCP: Katheen Roselie [place], NP   REFERRING PROVIDER: Jerri Kay HERO, MD   REFERRING DIAG:  Diagnosis  M79.604 (ICD-10-CM) - Pain in right leg  M54.16 (ICD-10-CM) - Radiculopathy, lumbar region    THERAPY DIAG:  Other low back pain  Difficulty in walking, not elsewhere classified  Muscle weakness (generalized)  Pain in right leg  Rationale for Evaluation and Treatment: Rehabilitation  ONSET DATE: 7-8 months  SUBJECTIVE:   SUBJECTIVE STATEMENT: Pt arriving stating  low back pain of 6/10. Pt reporting difficulty with sleeping at times. Pt reporting pain running down his lateral right thigh  PERTINENT HISTORY: DM, seizure disorder, arthritis, see PMH above  PAIN:  NPRS scale: 6/10 Pain location: low back and down Rt LE Pain description: achy, concentrated in low back mostly Aggravating factors: leaning and moving in certain positions Relieving factors: changing positions  PRECAUTIONS: None  WEIGHT BEARING RESTRICTIONS: No  FALLS:  Has patient fallen in last 6 months? No  LIVING  ENVIRONMENT: Lives with: lives with their family and lives with their spouse Lives in: House/apartment Stairs: Yes: External: 15 steps; bilateral but cannot reach both Has following equipment at home: None  OCCUPATION: retired  PLOF: Independent  PATIENT GOALS: dance without pain  Next MD visit:   OBJECTIVE:   DIAGNOSTIC FINDINGS: Result Narrative 11/29/23  X-rays of the lumbar spine show degenerative changes.  No acute abnormalities.  PATIENT SURVEYS:  Patient-Specific Activity Scoring Scheme  0 represents "unable to perform." 10 represents "able to perform at prior level. 0 1 2 3 4 5 6 7 8 9  10 (Date and Score)   Activity Eval  12/19/23     1. dancing  6    2. Lifting items from floor (watermelon)  4    3. Stairs navigation 5   4.    5.    Score 5    Total score = sum of the activity scores/number of activities Minimum detectable change (90%CI) for average score = 2 points Minimum detectable change (90%CI) for single activity score = 3 points  COGNITION: Overall cognitive status: WFL    SENSATION: Numbness and tingling down Rt LE into lateral thigh to knee   POSTURE:  rounded shoulders and forward head    LOWER EXTREMITY ROM:   ROM Right Eval 12/19/23  Left eval  Hip flexion 115 115  Hip extension    Hip abduction    Hip adduction    Hip internal rotation    Hip external rotation    Knee flexion    Knee extension    Ankle dorsiflexion    Ankle plantarflexion    Ankle inversion    Ankle eversion     (Blank rows = not tested)   LUMBAR ROM:   ROM AROM  eval  Flexion 70 c pain in low back  Extension 10 c pain  Right lateral flexion 22 c pain  Left lateral flexion 25  Right rotation WFL  Left rotation WFL   (Blank rows = not tested)    LOWER EXTREMITY MMT:  MMT Right Eval 12/19/23 Left eval  Hip flexion 40.3 44.5  Hip extension    Hip abduction    Hip adduction    Hip internal rotation    Hip external rotation    Knee  flexion 63.8 65.4  Knee extension 53.1 60.2  Ankle dorsiflexion    Ankle plantarflexion    Ankle inversion    Ankle eversion     (Blank rows = not tested)  SPECIAL TESTS:  12/19/23 Slump test: positive on Rt   FUNCTIONAL TESTS:  12/19/23: 5 time sit to stand: 13.75 seconds no UE support  GAIT: Distance walked: clinic distances Assistive device utilized: None Level of assistance: Complete Independence Comments: mild antalgic gait pattern with wide BOS  TODAY'S TREATMENT                                                                          DATE: 12/27/23 Therex: Nustep: 8 minutes, Level 6 UE/LE Supine bridges: 2 x 10 holding 5 sec Supine trunk rotation x 4 holding 30 sec bil  Supine modified 100's x 10 holding 10 sec Prone hip extension 2 x 10 bil LE alternating Door way QL stretch x 2 bil sides x 15 sec holds Standing trunk extension x 10 holding 5 sec Manual Lumbar grade 2-3 PA mobs L2-L5 Percussion device to lumbar paraspinals and Rt piriformis, glutes and Rt IT band  Self Care:  Self STM using tennis ball      TODAY'S TREATMENT                                                                          DATE: 12/19/23 Therex:  HEP instruction/performance c cues for techniques, handout provided.  Trial set performed of each for comprehension and symptom assessment.  See below for exercise list Self Care:  Sitting position edu to keep knees lower than hips with sitting and when driving a car Sleeping positions discussed and recommended   PATIENT EDUCATION:  Education details: HEP, POC Person educated: Patient Education method: Explanation, Demonstration, Verbal cues, and Handouts Education comprehension: verbalized understanding, returned demonstration, and verbal cues required  HOME EXERCISE  PROGRAM: Access Code: [email or web address] URL: [email or web address] Date: 12/19/2023 Prepared by: Delon Lunger  Exercises - Standing Quadratus Lumborum Stretch with Doorway  - 1 x daily - 7 x weekly - 5 reps - 10 seconds hold - Standing Hip Hiking  - 2 x daily - 7 x weekly - 2 sets - 10 reps - Hooklying Single Knee to Chest Stretch  - 2 x daily - 7 x weekly - 3 reps - 20 seconds hold - Supine Lower Trunk Rotation  - 2 x daily - 7 x weekly - 3 reps - 20 seconds hold  ASSESSMENT:  CLINICAL IMPRESSION: Pt arriving today reporting 6/10 pain in his low back and down his right LE along IT band.Pt was edu in [employer] using tennis ball. Pt tolerating lumbar mobs and percussion well. Pt's HEP was reviewed this visit and pt able to demonstrate compliance.  Recommending continued skilled PT to maximize pt's function.   OBJECTIVE IMPAIRMENTS: Abnormal gait, difficulty walking, decreased ROM, decreased strength, impaired flexibility, postural dysfunction, and pain.   ACTIVITY LIMITATIONS: bending, sitting, squatting, sleeping, and dancing  PARTICIPATION LIMITATIONS: driving and community activity  PERSONAL FACTORS: see PMH and pertinent history above are also affecting patient's functional outcome.   REHAB POTENTIAL: Good  CLINICAL DECISION MAKING: Stable/uncomplicated  EVALUATION COMPLEXITY: Low   GOALS: Goals reviewed with patient? Yes  SHORT TERM GOALS: (target date for Short term goals are 3 weeks 01/09/2024)   1.  Patient will demonstrate independent use of home exercise program to maintain progress from in clinic treatments. Goal  status: Met for initial HEP. 12/27/23  2. Pt will improve his 5 time sit to stand to </= 10 seconds with no UE support.   Goal status: New  LONG TERM GOALS: (target dates for all long term goals are 10 weeks 03/02/2024 )   1. Patient will demonstrate/report pain at worst less than or equal to 2/10 to facilitate minimal limitation in daily activity  secondary to pain symptoms. Goal status: New   2. Patient will demonstrate independent use of home exercise program to facilitate ability to maintain/progress functional gains from skilled physical therapy services.  Goal status: New   3. Patient will demonstrate Patient specific functional scale avg > or = 7 to indicate reduced disability due to condition.  Goal status: New   4.  Patient will demonstrate Rt LE quad strength will increase by >/= 5 pounds throughout to faciltiate usual transfers, stairs, squatting at PLOF for daily life.  Goal status: New   5.  Patient will demonstrate up and down 1 flight of stairs with single hand rail with pain </= 2/10.  Goal status: New   6.  Pt will reporting return to dancing with pain </= 2/10.  Goal status: New    PLAN:  PT FREQUENCY: 1-2x/week  PT DURATION: 10 weeks  PLANNED INTERVENTIONS: Can include 02853- PT Re-evaluation, 97110-Therapeutic exercises, 97530- Therapeutic activity, [id number]- Neuromuscular re-education, 97535- Self Care, 97140- Manual therapy, [phone]- Gait training, [phone]- Orthotic Fit/training, [phone]- Canalith repositioning, [id number]- Aquatic Therapy, [phone]- Electrical stimulation (unattended), [id number] Physical performance testing, 97016- Vasopneumatic device, [id number]- Ultrasound, [id number]- Traction (mechanical), [id number]- Ionotophoresis 4mg /ml Dexamethasone ,  79439 - Needle insertion w/o injection 1 or 2 muscles, 20561 - Needle insertion w/o injection 3 or more muscles.   Patient/Family education, Balance training, Stair training, Taping, Dry Needling, Joint mobilization, Joint manipulation, Spinal manipulation, Spinal mobilization, Scar mobilization, Vestibular training, Visual/preceptual remediation/compensation, DME instructions, Cryotherapy, and Moist heat.  All performed as medically necessary.  All included unless contraindicated  PLAN FOR NEXT SESSION:, LE strengthening, lumbar mobs, core strengthening, Response to lumbar mobs and  percussion?     Delon JONELLE Lunger, PT, MPT 12/27/2023, 4:50 PM  Date of referral: 7.8/25 Referring provider: Jerri Kay HERO, MD  Referring diagnosis?  M79.604 (ICD-10-CM) - Pain in right leg  M54.16 (ICD-10-CM) - Radiculopathy, lumbar region   Treatment diagnosis? (if different than referring diagnosis) M54.59, r26.2, m62.81, m79.604  What was this (referring dx) caused by? Arthritis  Nature of Condition: Initial Onset (within last 3 months)   Laterality: Rt  Current Functional Measure Score: Patient Specific Functional Scale 5  Objective measurements identify impairments when they are compared to normal values, the uninvolved extremity, and prior level of function.  [x]  Yes  []  No  Objective assessment of functional ability: Minimal functional limitations   Briefly describe symptoms: pain, weakness and radiation down Rt LE  How did symptoms start: over time  Average pain intensity:  Last 24 hours: 6/10  Past week: 7/10  How often does the pt experience symptoms? Frequently  How much have the symptoms interfered with usual daily activities? Moderately  How has condition changed since care began at this facility? No change first treatment visit  In general, how is the patients overall health? Very Good   BACK PAIN (STarT Back Screening Tool) Has pain spread down the leg(s) at some time in the last 2 weeks? yes Has there been pain in the shoulder or neck at some time in the last 2 weeks? no Has the  pt only walked short distances because of back pain? yes Has patient dressed more slowly because of back pain in the past 2 weeks? yes Does patient think it's not safe for a person with this condition to be physically active? no Does patient have worrying thoughts a lot of the time? yes Does patient feel back pain is terrible and will never get any better? no Has patient stopped enjoying things they usually enjoy? some

## 2024-01-02 NOTE — Therapy (Signed)
 OUTPATIENT PHYSICAL THERAPY LOWER EXTREMITY   Patient Name: Nicholas Rasmussen MRN: [id number] DOB:[birth year], 78 y.o., male Today's Date: 01/03/2024  END OF SESSION:  PT End of Session - 01/03/24 1429     Visit Number 3    Number of Visits 20    Date for PT Re-Evaluation 03/02/24    Progress Note Due on Visit 10    PT Start Time 1345    PT Stop Time 1423    PT Time Calculation (min) 38 min    Activity Tolerance Patient tolerated treatment well    Behavior During Therapy [facility] for tasks assessed/performed            Past Medical History:  Diagnosis Date   Allergic rhinitis 05/07/2014   Arthritis    Diabetes mellitus without complication (HCC)    Dizziness 01/02/2016   GERD (gastroesophageal reflux disease)    Headache 03/13/2015   Hx of adenomatous colonic polyps 10/04/2023   Kidney lesion 07/22/2020   right, followed by urology   Left flank pain 01/02/2016   Low back pain 06/06/2015   Pain of left side of body 08/13/2015   Post herpetic neuralgia    Routine general medical examination at a health care facility 05/07/2014   Seizure disorder (HCC) 05/07/2014   Seizures (HCC)    Last seizure in the 70s.   Shingles    Stye 05/07/2014   Past Surgical History:  Procedure Laterality Date   COLONOSCOPY     KNEE SURGERY Left    left miniscus   TOTAL KNEE ARTHROPLASTY Left 02/28/2023   Procedure: TOTAL KNEE ARTHROPLASTY;  Surgeon: Jerri Kay HERO, MD;  Location: [facility] OR;  Service: Orthopedics;  Laterality: Left;   UPPER GASTROINTESTINAL ENDOSCOPY     uvala surgery     Patient Active Problem List   Diagnosis Date Noted   Hx of adenomatous colonic polyps 10/04/2023   Belching 08/26/2023   Status post total left knee replacement 02/28/2023   Renal mass, right; Bosniak 4 complex cyst; 1 cm 09/09/2022   Vasomotor rhinitis 03/03/2022   Acute medial meniscus tear of left knee 02/04/2022   Primary osteoarthritis of left knee 02/04/2022   Aortic root dilatation (HCC) 12/18/2021    Slow transit constipation 12/08/2021   Left-sided chest wall pain 12/08/2021   Multiple pulmonary nodules 10/06/2021   Hyperlipidemia associated with type 2 diabetes mellitus (HCC) 08/19/2021   [name] 04/07/2021   Complex renal cyst 04/07/2021   Varicose veins of both lower extremities with pain 02/02/2019   Screening for colon cancer 09/19/2018   Type 2 diabetes mellitus with diabetic polyneuropathy, without long-term current use of insulin  (HCC) 03/29/2018   Diabetic neuropathy (HCC) 03/29/2018   Chronic pain of left knee 03/23/2018   Coronary artery disease involving native coronary artery of native heart with angina pectoris (HCC) 08/03/2017   Post herpetic neuralgia 07/29/2016   Dizziness 01/02/2016   Chronic midline low back pain without sciatica 06/06/2015   Nonintractable episodic headache 03/13/2015   GERD (gastroesophageal reflux disease) 11/15/2014   Seizure disorder (HCC) 05/07/2014   Allergic rhinitis 05/07/2014    PCP: Katheen Roselie [place]ford, NP   REFERRING PROVIDER: Jerri Kay HERO, MD   REFERRING DIAG:  Diagnosis  M79.604 (ICD-10-CM) - Pain in right leg  M54.16 (ICD-10-CM) - Radiculopathy, lumbar region    THERAPY DIAG:  Other low back pain  Difficulty in walking, not elsewhere classified  Muscle weakness (generalized)  Pain in right leg  Rationale  for Evaluation and Treatment: Rehabilitation  ONSET DATE: 7-8 months  SUBJECTIVE:   SUBJECTIVE STATEMENT: Pt reports sitting and driving can aggravate the pain.  PERTINENT HISTORY: DM, seizure disorder, arthritis, see PMH above  PAIN:  NPRS scale: 6/10 Pain location: low back and down Rt LE Pain description: achy, concentrated in low back mostly Aggravating factors: leaning and moving in certain positions Relieving factors: changing positions  PRECAUTIONS: None  WEIGHT BEARING RESTRICTIONS: No  FALLS:  Has patient fallen in last 6 months? No  LIVING  ENVIRONMENT: Lives with: lives with their family and lives with their spouse Lives in: House/apartment Stairs: Yes: External: 15 steps; bilateral but cannot reach both Has following equipment at home: None  OCCUPATION: retired  PLOF: Independent  PATIENT GOALS: dance without pain  Next MD visit:   OBJECTIVE:   DIAGNOSTIC FINDINGS: Result Narrative 11/29/23  X-rays of the lumbar spine show degenerative changes.  No acute abnormalities.  PATIENT SURVEYS:  Patient-Specific Activity Scoring Scheme  0 represents "unable to perform." 10 represents "able to perform at prior level. 0 1 2 3 4 5 6 7 8 9  10 (Date and Score)   Activity Eval  12/19/23     1. dancing  6    2. Lifting items from floor (watermelon)  4    3. Stairs navigation 5   4.    5.    Score 5    Total score = sum of the activity scores/number of activities Minimum detectable change (90%CI) for average score = 2 points Minimum detectable change (90%CI) for single activity score = 3 points  COGNITION: Overall cognitive status: WFL    SENSATION: Numbness and tingling down Rt LE into lateral thigh to knee   POSTURE:  rounded shoulders and forward head    LOWER EXTREMITY ROM:   ROM Right Eval 12/19/23  Left eval  Hip flexion 115 115  Hip extension    Hip abduction    Hip adduction    Hip internal rotation    Hip external rotation    Knee flexion    Knee extension    Ankle dorsiflexion    Ankle plantarflexion    Ankle inversion    Ankle eversion     (Blank rows = not tested)   LUMBAR ROM:   ROM AROM  eval  Flexion 70 c pain in low back  Extension 10 c pain  Right lateral flexion 22 c pain  Left lateral flexion 25  Right rotation WFL  Left rotation WFL   (Blank rows = not tested)    LOWER EXTREMITY MMT:  MMT Right Eval 12/19/23 Left eval  Hip flexion 40.3 44.5  Hip extension    Hip abduction    Hip adduction    Hip internal rotation    Hip external rotation    Knee  flexion 63.8 65.4  Knee extension 53.1 60.2  Ankle dorsiflexion    Ankle plantarflexion    Ankle inversion    Ankle eversion     (Blank rows = not tested)  SPECIAL TESTS:  12/19/23 Slump test: positive on Rt   FUNCTIONAL TESTS:  12/19/23: 5 time sit to stand: 13.75 seconds no UE support  GAIT: Distance walked: clinic distances Assistive device utilized: None Level of assistance: Complete Independence Comments: mild antalgic gait pattern with wide BOS  TODAY'S TREATMENT                                                                          DATE:  01/03/24 Rec bike level 5  8 minutes Door way QL stretch x 2 bil sides x 15 sec holds Standing trunk extension x 10 holding 5 sec Prone hip extension 2 x 10 bil LE alternating Supine bridges: 2 x 10 holding 5 sec Supine Tband hip abduction 3x10 Green Supine trunk rotation x 4 holding 30 sec bil  Percussion device to lumbar paraspinals and Rt piriformis, glutes and Rt IT band    12/27/23 Therex: Nustep: 8 minutes, Level 6 UE/LE Supine bridges: 2 x 10 holding 5 sec Supine trunk rotation x 4 holding 30 sec bil  Supine modified 100's x 10 holding 10 sec Prone hip extension 2 x 10 bil LE alternating Door way QL stretch x 2 bil sides x 15 sec holds Standing trunk extension x 10 holding 5 sec Manual Lumbar grade 2-3 PA mobs L2-L5 Percussion device to lumbar paraspinals and Rt piriformis, glutes and Rt IT band  Self Care:  Self STM using tennis ball      TODAY'S TREATMENT                                                                          DATE: 12/19/23 Therex:  HEP instruction/performance c cues for techniques, handout provided.  Trial set performed of each for comprehension and symptom assessment.  See below for exercise list Self Care:  Sitting position edu to  keep knees lower than hips with sitting and when driving a car Sleeping positions discussed and recommended   PATIENT EDUCATION:  Education details: HEP, POC Person educated: Patient Education method: Explanation, Demonstration, Verbal cues, and Handouts Education comprehension: verbalized understanding, returned demonstration, and verbal cues required  HOME EXERCISE PROGRAM: Access Code: [email or web address] URL: [email or web address] Date: 12/19/2023 Prepared by: Delon Lunger  Exercises - Standing Quadratus Lumborum Stretch with Doorway  - 1 x daily - 7 x weekly - 5 reps - 10 seconds hold - Standing Hip Hiking  - 2 x daily - 7 x weekly - 2 sets - 10 reps - Hooklying Single Knee to Chest Stretch  - 2 x daily - 7 x weekly - 3 reps - 20 seconds hold - Supine Lower Trunk Rotation  - 2 x daily - 7 x weekly - 3 reps - 20 seconds hold  ASSESSMENT:  CLINICAL IMPRESSION: Pt reported feeling more mobile and flexible after manual stretching and percussive device.    OBJECTIVE IMPAIRMENTS: Abnormal gait, difficulty walking, decreased ROM, decreased strength, impaired flexibility, postural dysfunction, and pain.   ACTIVITY LIMITATIONS: bending, sitting, squatting, sleeping, and dancing  PARTICIPATION LIMITATIONS: driving and community activity  PERSONAL FACTORS: see PMH and pertinent history above are also affecting patient's functional outcome.   REHAB POTENTIAL: Good  CLINICAL DECISION MAKING: Stable/uncomplicated  EVALUATION COMPLEXITY: Low   GOALS: Goals  reviewed with patient? Yes  SHORT TERM GOALS: (target date for Short term goals are 3 weeks 01/09/2024)   1.  Patient will demonstrate independent use of home exercise program to maintain progress from in clinic treatments. Goal status: Met for initial HEP. 12/27/23  2. Pt will improve his 5 time sit to stand to </= 10 seconds with no UE support.   Goal status: New  LONG TERM GOALS: (target dates for all long term  goals are 10 weeks 03/02/2024 )   1. Patient will demonstrate/report pain at worst less than or equal to 2/10 to facilitate minimal limitation in daily activity secondary to pain symptoms. Goal status: New   2. Patient will demonstrate independent use of home exercise program to facilitate ability to maintain/progress functional gains from skilled physical therapy services.  Goal status: New   3. Patient will demonstrate Patient specific functional scale avg > or = 7 to indicate reduced disability due to condition.  Goal status: New   4.  Patient will demonstrate Rt LE quad strength will increase by >/= 5 pounds throughout to faciltiate usual transfers, stairs, squatting at PLOF for daily life.  Goal status: New   5.  Patient will demonstrate up and down 1 flight of stairs with single hand rail with pain </= 2/10.  Goal status: New   6.  Pt will reporting return to dancing with pain </= 2/10.  Goal status: New    PLAN:  PT FREQUENCY: 1-2x/week  PT DURATION: 10 weeks  PLANNED INTERVENTIONS: Can include 02853- PT Re-evaluation, 97110-Therapeutic exercises, 97530- Therapeutic activity, [id number]- Neuromuscular re-education, 97535- Self Care, 97140- Manual therapy, [phone]- Gait training, [phone]- Orthotic Fit/training, [phone]- Canalith repositioning, [id number]- Aquatic Therapy, [phone]- Electrical stimulation (unattended), [id number] Physical performance testing, 97016- Vasopneumatic device, [id number]- Ultrasound, [id number]- Traction (mechanical), [id number]- Ionotophoresis 4mg /ml Dexamethasone ,  79439 - Needle insertion w/o injection 1 or 2 muscles, 20561 - Needle insertion w/o injection 3 or more muscles.   Patient/Family education, Balance training, Stair training, Taping, Dry Needling, Joint mobilization, Joint manipulation, Spinal manipulation, Spinal mobilization, Scar mobilization, Vestibular training, Visual/preceptual remediation/compensation, DME instructions, Cryotherapy, and Moist heat.  All performed as  medically necessary.  All included unless contraindicated  PLAN FOR NEXT SESSION: LE strengthening, lumbar mobs, core strengthening, Response to lumbar mobs and percussion?     Burnard CHRISTELLA Meth, PT, MPT 01/03/2024, 2:31 PM  Date of referral: 7.8/25 Referring provider: Jerri Kay CHRISTELLA, MD  Referring diagnosis?  M79.604 (ICD-10-CM) - Pain in right leg  M54.16 (ICD-10-CM) - Radiculopathy, lumbar region   Treatment diagnosis? (if different than referring diagnosis) M54.59, r26.2, m62.81, m79.604  What was this (referring dx) caused by? Arthritis  Nature of Condition: Initial Onset (within last 3 months)   Laterality: Rt  Current Functional Measure Score: Patient Specific Functional Scale 5  Objective measurements identify impairments when they are compared to normal values, the uninvolved extremity, and prior level of function.  [x]  Yes  []  No  Objective assessment of functional ability: Minimal functional limitations   Briefly describe symptoms: pain, weakness and radiation down Rt LE  How did symptoms start: over time  Average pain intensity:  Last 24 hours: 6/10  Past week: 7/10  How often does the pt experience symptoms? Frequently  How much have the symptoms interfered with usual daily activities? Moderately  How has condition changed since care began at this facility? No change first treatment visit  In general, how is the patients overall health? Very Good   BACK PAIN (  STarT Back Screening Tool) Has pain spread down the leg(s) at some time in the last 2 weeks? yes Has there been pain in the shoulder or neck at some time in the last 2 weeks? no Has the pt only walked short distances because of back pain? yes Has patient dressed more slowly because of back pain in the past 2 weeks? yes Does patient think it's not safe for a person with this condition to be physically active? no Does patient have worrying thoughts a lot of the time? yes Does patient feel back pain  is terrible and will never get any better? no Has patient stopped enjoying things they usually enjoy? some

## 2024-01-03 ENCOUNTER — Ambulatory Visit

## 2024-01-03 DIAGNOSIS — R262 Difficulty in walking, not elsewhere classified: Secondary | ICD-10-CM

## 2024-01-03 DIAGNOSIS — M79604 Pain in right leg: Secondary | ICD-10-CM

## 2024-01-03 DIAGNOSIS — M6281 Muscle weakness (generalized): Secondary | ICD-10-CM | POA: Diagnosis not present

## 2024-01-03 DIAGNOSIS — M5459 Other low back pain: Secondary | ICD-10-CM

## 2024-01-04 ENCOUNTER — Encounter: Payer: Self-pay | Admitting: Nurse Practitioner

## 2024-01-04 ENCOUNTER — Ambulatory Visit (INDEPENDENT_AMBULATORY_CARE_PROVIDER_SITE_OTHER): Admitting: Nurse Practitioner

## 2024-01-04 VITALS — BP 124/68 | HR 58 | Temp 98.2°F | Ht 76.0 in | Wt 255.6 lb

## 2024-01-04 DIAGNOSIS — G40909 Epilepsy, unspecified, not intractable, without status epilepticus: Secondary | ICD-10-CM

## 2024-01-04 DIAGNOSIS — E1142 Type 2 diabetes mellitus with diabetic polyneuropathy: Secondary | ICD-10-CM

## 2024-01-04 DIAGNOSIS — E1169 Type 2 diabetes mellitus with other specified complication: Secondary | ICD-10-CM | POA: Diagnosis not present

## 2024-01-04 DIAGNOSIS — Z7984 Long term (current) use of oral hypoglycemic drugs: Secondary | ICD-10-CM

## 2024-01-04 DIAGNOSIS — I25119 Atherosclerotic heart disease of native coronary artery with unspecified angina pectoris: Secondary | ICD-10-CM | POA: Diagnosis not present

## 2024-01-04 DIAGNOSIS — B0229 Other postherpetic nervous system involvement: Secondary | ICD-10-CM

## 2024-01-04 DIAGNOSIS — K219 Gastro-esophageal reflux disease without esophagitis: Secondary | ICD-10-CM

## 2024-01-04 DIAGNOSIS — E785 Hyperlipidemia, unspecified: Secondary | ICD-10-CM | POA: Diagnosis not present

## 2024-01-04 DIAGNOSIS — R058 Other specified cough: Secondary | ICD-10-CM

## 2024-01-04 LAB — COMPREHENSIVE METABOLIC PANEL WITH GFR
ALT: 7 U/L (ref 0–53)
AST: 18 U/L (ref 0–37)
Albumin: 4.1 g/dL (ref 3.5–5.2)
Alkaline Phosphatase: 52 U/L (ref 39–117)
BUN: 17 mg/dL (ref 6–23)
CO2: 34 meq/L — ABNORMAL HIGH (ref 19–32)
Calcium: 9.1 mg/dL (ref 8.4–10.5)
Chloride: 102 meq/L (ref 96–112)
Creatinine, Ser: 1.24 mg/dL (ref 0.40–1.50)
GFR: 56.76 mL/min — ABNORMAL LOW (ref >60.00)
Glucose, Bld: 99 mg/dL (ref 70–99)
Potassium: 3.9 meq/L (ref 3.5–5.1)
Sodium: 139 meq/L (ref 135–145)
Total Bilirubin: 0.3 mg/dL (ref 0.2–1.2)
Total Protein: 7.5 g/dL (ref 6.0–8.3)

## 2024-01-04 LAB — HEMOGLOBIN A1C: Hgb A1c MFr Bld: 6.8 % — ABNORMAL HIGH (ref 4.6–6.5)

## 2024-01-04 LAB — MICROALBUMIN / CREATININE URINE RATIO
Creatinine,U: 442.8 mg/dL
Microalb Creat Ratio: 4 mg/g (ref 0.0–30.0)
Microalb, Ur: 1.8 mg/dL (ref 0.0–1.9)

## 2024-01-04 LAB — LDL CHOLESTEROL, DIRECT: Direct LDL: 65 mg/dL

## 2024-01-04 MED ORDER — METOPROLOL TARTRATE 25 MG PO TABS
12.5000 mg | ORAL_TABLET | Freq: Every day | ORAL | 3 refills | Status: DC
Start: 2024-01-04 — End: 2024-01-16

## 2024-01-04 MED ORDER — GLIPIZIDE ER 5 MG PO TB24: ORAL_TABLET | ORAL | 3 refills | Status: DC

## 2024-01-04 MED ORDER — FLUTICASONE PROPIONATE 50 MCG/ACT NA SUSP: 2.0000 | Freq: Every day | NASAL | 6 refills | Status: DC

## 2024-01-04 MED ORDER — GABAPENTIN 300 MG PO CAPS: ORAL_CAPSULE | ORAL | 3 refills | Status: DC

## 2024-01-04 MED ORDER — DIVALPROEX SODIUM ER 500 MG PO TB24: 500.0000 mg | ORAL_TABLET | Freq: Every day | ORAL | 3 refills | Status: DC

## 2024-01-04 MED ORDER — ESOMEPRAZOLE MAGNESIUM 40 MG PO CPDR: 40.0000 mg | DELAYED_RELEASE_CAPSULE | Freq: Every day | ORAL | 3 refills | Status: DC

## 2024-01-04 NOTE — Assessment & Plan Note (Signed)
 Jardiance  switched to glipizide  due to high copay and yeast infection Repeat hgbA1c, LDL, CMP Maintain glipizide  dose Normal foot exam Normal UACr F/up in 6months

## 2024-01-04 NOTE — Progress Notes (Signed)
 Established Patient Visit  Patient: Nicholas Rasmussen   DOB: Jun 09, 1945   78 y.o. Male  MRN: [id number] Visit Date: 01/04/2024  Subjective:    Chief Complaint  Patient presents with   Follow-up    1 month follow for cough-throat still feels dry  FOOT Exam due    HPI GERD (gastroesophageal reflux disease) Reports persistent throat clearing and productive cough, acute on chronic, worse in last 86month. Associated with post nasal drainage, worse in AM No dysphagia, no hoarseness, no SOB Omeprazole  switched to nexium  40mg  3weeks ago. No improvement with Flonase  and nexium  He has appointment with ENT 02/23/2024  Advised to avoid eating within 2hrs of bedtime Switch nexium  dose to PM dose Add zyrtec  10mg  in PM Maintain appointment with ENT  Coronary artery disease involving native coronary artery of native heart with angina pectoris (HCC) BP at goal,  current use of atorvastatin . Controlled DIABETES Under the care of cardiology  Hyperlipidemia associated with type 2 diabetes mellitus (HCC) Repeat lipid panel Maintain atorvastatin  dose  Type 2 diabetes mellitus with diabetic polyneuropathy, without long-term current use of insulin  (HCC) Jardiance  switched to glipizide  due to high copay and yeast infection Repeat hgbA1c, LDL, CMP Maintain glipizide  dose Normal foot exam Normal UACr F/up in 6months  Reviewed medical, surgical, and social history today  Medications: Outpatient Medications Prior to Visit  Medication Sig   acyclovir  ointment (ZOVIRAX ) 5 % Apply 1 Application topically every 6 (six) hours as needed.   aspirin  EC 81 MG tablet Take 1 tablet (81 mg total) by mouth 2 (two) times daily. To be taken after surgery to prevent blood clots (Patient taking differently: Take 81 mg by mouth daily. To be taken after surgery to prevent blood clots)   atorvastatin  (LIPITOR) 40 MG tablet Take 1 tablet (40 mg total) by mouth every evening.   Cholecalciferol (VITAMIN  D-3) 125 MCG (5000 UT) TABS Take 5,000 Units by mouth daily.   clotrimazole  (LOTRIMIN ) 1 % cream Apply topically.   Collagen-Vitamin C-Biotin (COLLAGEN PO) Take 1 Dose by mouth daily.   ipratropium (ATROVENT) 0.03 % nasal spray Place 2 sprays into both nostrils daily.   meclizine  (ANTIVERT ) 25 MG tablet Take 1 tablet (25 mg total) by mouth 3 (three) times daily as needed for dizziness.   methocarbamol  (ROBAXIN -750) 750 MG tablet Take 1 tablet (750 mg total) by mouth 2 (two) times daily as needed for muscle spasms.   nitroGLYCERIN  (NITROSTAT ) 0.4 MG SL tablet Place 1 tablet (0.4 mg total) under the tongue every 5 (five) minutes as needed for chest pain.   OVER THE COUNTER MEDICATION Multi collagen 1 scoop in the am   Probiotic Product (PROBIOTIC PO) Take 1 tablet by mouth daily.   [DISCONTINUED] divalproex  (DEPAKOTE  ER) 500 MG 24 hr tablet Take 1 tablet (500 mg total) by mouth at bedtime.   [DISCONTINUED] esomeprazole  (NEXIUM ) 40 MG capsule Take 1 capsule (40 mg total) by mouth daily before breakfast. Take 30-60 min before eating   [DISCONTINUED] fluticasone  (FLONASE ) 50 MCG/ACT nasal spray Place 2 sprays into both nostrils daily.   [DISCONTINUED] gabapentin  (NEURONTIN ) 300 MG capsule TAKE 1 CAPSULE BY MOUTH EVERYDAY AT BEDTIME   [DISCONTINUED] glipiZIDE  (GLUCOTROL  XL) 5 MG 24 hr tablet TAKE 1 TABLET BY MOUTH EVERY DAY WITH BREAKFAST   [DISCONTINUED] metoprolol  tartrate (LOPRESSOR ) 25 MG tablet Take 0.5 tablets (12.5 mg total) by mouth daily.   [DISCONTINUED] predniSONE  (STERAPRED UNI-PAK  21 TAB) 10 MG (21) TBPK tablet Take as directed   No facility-administered medications prior to visit.   Reviewed past medical and social history.   ROS per HPI above      Objective:  BP 124/68 (BP Location: Left Arm, Patient Position: Sitting, Cuff Size: Large)   Pulse (!) 58   Temp 98.2 F (36.8 C) (Oral)   Ht 6' 4 (1.93 m)   Wt 255 lb 9.6 oz (115.9 kg)   SpO2 96%   BMI 31.11 kg/m       Physical Exam Vitals reviewed.  HENT:     Mouth/Throat:     Pharynx: Postnasal drip present. No pharyngeal swelling, oropharyngeal exudate or posterior oropharyngeal erythema.  Cardiovascular:     Rate and Rhythm: Normal rate and regular rhythm.     Pulses: Normal pulses.     Heart sounds: Normal heart sounds.  Pulmonary:     Effort: Pulmonary effort is normal.     Breath sounds: Normal breath sounds.  Musculoskeletal:     Cervical back: Normal range of motion and neck supple.  Lymphadenopathy:     Cervical: No cervical adenopathy.  Neurological:     Mental Status: He is alert and oriented to person, place, and time.     No results found for any visits on 01/04/24.    Assessment & Plan:    Problem List Items Addressed This Visit     Coronary artery disease involving native coronary artery of native heart with angina pectoris (HCC)   BP at goal,  current use of atorvastatin . Controlled DIABETES Under the care of cardiology      Relevant Medications   metoprolol  tartrate (LOPRESSOR ) 25 MG tablet   Other Relevant Orders   Direct LDL   GERD (gastroesophageal reflux disease)   Reports persistent throat clearing and productive cough, acute on chronic, worse in last 50month. Associated with post nasal drainage, worse in AM No dysphagia, no hoarseness, no SOB Omeprazole  switched to nexium  40mg  3weeks ago. No improvement with Flonase  and nexium  He has appointment with ENT 02/23/2024  Advised to avoid eating within 2hrs of bedtime Switch nexium  dose to PM dose Add zyrtec  10mg  in PM Maintain appointment with ENT      Relevant Medications   esomeprazole  (NEXIUM ) 40 MG capsule   Hyperlipidemia associated with type 2 diabetes mellitus (HCC) - Primary   Repeat lipid panel Maintain atorvastatin  dose      Relevant Medications   glipiZIDE  (GLUCOTROL  XL) 5 MG 24 hr tablet   metoprolol  tartrate (LOPRESSOR ) 25 MG tablet   Other Relevant Orders   Direct LDL   Post herpetic  neuralgia   Relevant Medications   gabapentin  (NEURONTIN ) 300 MG capsule   Seizure disorder (HCC)   Relevant Medications   divalproex  (DEPAKOTE  ER) 500 MG 24 hr tablet   gabapentin  (NEURONTIN ) 300 MG capsule   Type 2 diabetes mellitus with diabetic polyneuropathy, without long-term current use of insulin  (HCC)   Jardiance  switched to glipizide  due to high copay and yeast infection Repeat hgbA1c, LDL, CMP Maintain glipizide  dose Normal foot exam Normal UACr F/up in 6months      Relevant Medications   divalproex  (DEPAKOTE  ER) 500 MG 24 hr tablet   gabapentin  (NEURONTIN ) 300 MG capsule   glipiZIDE  (GLUCOTROL  XL) 5 MG 24 hr tablet   Other Relevant Orders   Hemoglobin A1c   Comprehensive metabolic panel with GFR   Microalbumin / creatinine urine ratio   Other Visit Diagnoses  Upper airway cough syndrome       Relevant Medications   fluticasone  (FLONASE ) 50 MCG/ACT nasal spray      Return in about 6 months (around 07/06/2024) for HTN, DM, hyperlipidemia (fasting).     Roselie Mood, NP

## 2024-01-04 NOTE — Assessment & Plan Note (Signed)
 Reports persistent throat clearing and productive cough, acute on chronic, worse in last 58month. Associated with post nasal drainage, worse in AM No dysphagia, no hoarseness, no SOB Omeprazole  switched to nexium  40mg  3weeks ago. No improvement with Flonase  and nexium  He has appointment with ENT 02/23/2024  Advised to avoid eating within 2hrs of bedtime Switch nexium  dose to PM dose Add zyrtec  10mg  in PM Maintain appointment with ENT

## 2024-01-04 NOTE — Assessment & Plan Note (Signed)
 Repeat lipid panel Maintain atorvastatin dose

## 2024-01-04 NOTE — Patient Instructions (Signed)
 Start zyrtec  1tab at bedtime Take nexium  at bedtime Maintain other med doses Go to lab

## 2024-01-04 NOTE — Assessment & Plan Note (Signed)
 BP at goal,  current use of atorvastatin . Controlled DIABETES Under the care of cardiology

## 2024-01-05 ENCOUNTER — Ambulatory Visit: Admitting: Family Medicine

## 2024-01-05 ENCOUNTER — Ambulatory Visit

## 2024-01-05 ENCOUNTER — Ambulatory Visit: Payer: Self-pay | Admitting: Nurse Practitioner

## 2024-01-05 DIAGNOSIS — R262 Difficulty in walking, not elsewhere classified: Secondary | ICD-10-CM | POA: Diagnosis not present

## 2024-01-05 DIAGNOSIS — M79604 Pain in right leg: Secondary | ICD-10-CM | POA: Diagnosis not present

## 2024-01-05 DIAGNOSIS — M6281 Muscle weakness (generalized): Secondary | ICD-10-CM | POA: Diagnosis not present

## 2024-01-05 DIAGNOSIS — M5459 Other low back pain: Secondary | ICD-10-CM

## 2024-01-05 NOTE — Therapy (Signed)
 OUTPATIENT PHYSICAL THERAPY LOWER EXTREMITY   Patient Name: Nicholas Rasmussen MRN: [id number] DOB:Aug 12, 1945, 78 y.o., male Today's Date: 01/05/2024  END OF SESSION:  PT End of Session - 01/05/24 1104     Visit Number 4    Number of Visits 20    Date for PT Re-Evaluation 03/02/24    Progress Note Due on Visit 10    PT Start Time 1027    PT Stop Time 1105    PT Time Calculation (min) 38 min    Activity Tolerance Patient tolerated treatment well    Behavior During Therapy [facility] for tasks assessed/performed             Past Medical History:  Diagnosis Date   Allergic rhinitis 05/07/2014   Arthritis    Diabetes mellitus without complication (HCC)    Dizziness 01/02/2016   GERD (gastroesophageal reflux disease)    Headache 03/13/2015   Hx of adenomatous colonic polyps 10/04/2023   Kidney lesion 07/22/2020   right, followed by urology   Left flank pain 01/02/2016   Low back pain 06/06/2015   Pain of left side of body 08/13/2015   Post herpetic neuralgia    Routine general medical examination at a health care facility 05/07/2014   Seizure disorder (HCC) 05/07/2014   Seizures (HCC)    Last seizure in the 70s.   Shingles    Stye 05/07/2014   Past Surgical History:  Procedure Laterality Date   COLONOSCOPY     KNEE SURGERY Left    left miniscus   TOTAL KNEE ARTHROPLASTY Left 02/28/2023   Procedure: TOTAL KNEE ARTHROPLASTY;  Surgeon: Jerri Kay HERO, MD;  Location: [facility] OR;  Service: Orthopedics;  Laterality: Left;   UPPER GASTROINTESTINAL ENDOSCOPY     uvala surgery     Patient Active Problem List   Diagnosis Date Noted   Hx of adenomatous colonic polyps 10/04/2023   Belching 08/26/2023   Status post total left knee replacement 02/28/2023   Renal mass, right; Bosniak 4 complex cyst; 1 cm 09/09/2022   Acute medial meniscus tear of left knee 02/04/2022   Primary osteoarthritis of left knee 02/04/2022   Aortic root dilatation (HCC) 12/18/2021   Slow transit constipation  12/08/2021   Left-sided chest wall pain 12/08/2021   Multiple pulmonary nodules 10/06/2021   Hyperlipidemia associated with type 2 diabetes mellitus (HCC) 08/19/2021   [name] 04/07/2021   Complex renal cyst 04/07/2021   Varicose veins of both lower extremities with pain 02/02/2019   Screening for colon cancer 09/19/2018   Type 2 diabetes mellitus with diabetic polyneuropathy, without long-term current use of insulin  (HCC) 03/29/2018   Diabetic neuropathy (HCC) 03/29/2018   Chronic pain of left knee 03/23/2018   Coronary artery disease involving native coronary artery of native heart with angina pectoris (HCC) 08/03/2017   Post herpetic neuralgia 07/29/2016   Dizziness 01/02/2016   Chronic midline low back pain without sciatica 06/06/2015   Nonintractable episodic headache 03/13/2015   GERD (gastroesophageal reflux disease) 11/15/2014   Seizure disorder (HCC) 05/07/2014   Allergic rhinitis 05/07/2014    PCP: Katheen Roselie Rockford, NP   REFERRING PROVIDER: Jerri Kay HERO, MD   REFERRING DIAG:  Diagnosis  M79.604 (ICD-10-CM) - Pain in right leg  M54.16 (ICD-10-CM) - Radiculopathy, lumbar region    THERAPY DIAG:  Other low back pain  Difficulty in walking, not elsewhere classified  Muscle weakness (generalized)  Pain in right leg  Rationale for Evaluation and Treatment:  Rehabilitation  ONSET DATE: 7-8 months  SUBJECTIVE:   SUBJECTIVE STATEMENT: Pt states no increase in pain but sitting is irritating.  PERTINENT HISTORY: DM, seizure disorder, arthritis, see PMH above  PAIN:  NPRS scale: 5.5 /10 Pain location: low back and down Rt LE Pain description: achy, concentrated in low back mostly Aggravating factors: leaning and moving in certain positions Relieving factors: changing positions  PRECAUTIONS: None  WEIGHT BEARING RESTRICTIONS: No  FALLS:  Has patient fallen in last 6 months? No  LIVING ENVIRONMENT: Lives  with: lives with their family and lives with their spouse Lives in: House/apartment Stairs: Yes: External: 15 steps; bilateral but cannot reach both Has following equipment at home: None  OCCUPATION: retired  PLOF: Independent  PATIENT GOALS: dance without pain  Next MD visit:   OBJECTIVE:   DIAGNOSTIC FINDINGS: Result Narrative 11/29/23  X-rays of the lumbar spine show degenerative changes.  No acute abnormalities.  PATIENT SURVEYS:  Patient-Specific Activity Scoring Scheme  0 represents "unable to perform." 10 represents "able to perform at prior level. 0 1 2 3 4 5 6 7 8 9  10 (Date and Score)   Activity Eval  12/19/23     1. dancing  6    2. Lifting items from floor (watermelon)  4    3. Stairs navigation 5   4.    5.    Score 5    Total score = sum of the activity scores/number of activities Minimum detectable change (90%CI) for average score = 2 points Minimum detectable change (90%CI) for single activity score = 3 points  COGNITION: Overall cognitive status: WFL    SENSATION: Numbness and tingling down Rt LE into lateral thigh to knee   POSTURE:  rounded shoulders and forward head    LOWER EXTREMITY ROM:   ROM Right Eval 12/19/23  Left eval  Hip flexion 115 115  Hip extension    Hip abduction    Hip adduction    Hip internal rotation    Hip external rotation    Knee flexion    Knee extension    Ankle dorsiflexion    Ankle plantarflexion    Ankle inversion    Ankle eversion     (Blank rows = not tested)   LUMBAR ROM:   ROM AROM  eval  Flexion 70 c pain in low back  Extension 10 c pain  Right lateral flexion 22 c pain  Left lateral flexion 25  Right rotation WFL  Left rotation WFL   (Blank rows = not tested)    LOWER EXTREMITY MMT:  MMT Right Eval 12/19/23 Left eval  Hip flexion 40.3 44.5  Hip extension    Hip abduction    Hip adduction    Hip internal rotation    Hip external rotation    Knee flexion 63.8 65.4   Knee extension 53.1 60.2  Ankle dorsiflexion    Ankle plantarflexion    Ankle inversion    Ankle eversion     (Blank rows = not tested)  SPECIAL TESTS:  12/19/23 Slump test: positive on Rt   FUNCTIONAL TESTS:  12/19/23: 5 time sit to stand: 13.75 seconds no UE support  GAIT: Distance walked: clinic distances Assistive device utilized: None Level of assistance: Complete Independence Comments: mild antalgic gait pattern with wide BOS  TODAY'S TREATMENT                                                                          DATE:  01/05/24   Rec bike level 5  8 minutes Door way QL stretch x 2 bil sides x 15 sec holds Standing trunk extension x 10 holding 5 sec Standing hip extension 2 x 10 bil LE alternating Supine bridges: 2 x 10 holding 5 sec Supine Tband hip abduction 3x10 Green Supine trunk rotation x 4 holding 30 sec bil  Supine marching with TA activation 2x10 Percussion device to lumbar paraspinals and Rt piriformis, glutes and Rt IT band       01/03/24 Rec bike level 5  8 minutes Door way QL stretch x 2 bil sides x 15 sec holds Standing trunk extension x 10 holding 5 sec Prone hip extension 2 x 10 bil LE alternating Supine bridges: 2 x 10 holding 5 sec Supine Tband hip abduction 3x10 Green Supine trunk rotation x 4 holding 30 sec bil  Percussion device to lumbar paraspinals and Rt piriformis, glutes and Rt IT band    12/27/23 Therex: Nustep: 8 minutes, Level 6 UE/LE Supine bridges: 2 x 10 holding 5 sec Supine trunk rotation x 4 holding 30 sec bil  Supine modified 100's x 10 holding 10 sec Prone hip extension 2 x 10 bil LE alternating Door way QL stretch x 2 bil sides x 15 sec holds Standing trunk extension x 10 holding 5 sec Manual Lumbar grade 2-3 PA mobs L2-L5 Percussion device to lumbar  paraspinals and Rt piriformis, glutes and Rt IT band  Self Care:  Self STM using tennis ball      TODAY'S TREATMENT                                                                          DATE: 12/19/23 Therex:  HEP instruction/performance c cues for techniques, handout provided.  Trial set performed of each for comprehension and symptom assessment.  See below for exercise list Self Care:  Sitting position edu to keep knees lower than hips with sitting and when driving a car Sleeping positions discussed and recommended   PATIENT EDUCATION:  Education details: HEP, POC Person educated: Patient Education method: Explanation, Demonstration, Verbal cues, and Handouts Education comprehension: verbalized understanding, returned demonstration, and verbal cues required  HOME EXERCISE PROGRAM: Access Code: [email or web address] URL: [email or web address] Date: 12/19/2023 Prepared by: Delon Lunger  Exercises - Standing Quadratus Lumborum Stretch with Doorway  - 1 x daily - 7 x weekly - 5 reps - 10 seconds hold - Standing Hip Hiking  - 2 x daily - 7 x weekly - 2 sets - 10 reps - Hooklying Single Knee to Chest Stretch  - 2 x daily - 7 x weekly - 3 reps - 20 seconds hold - Supine Lower Trunk Rotation  - 2 x daily - 7 x weekly - 3 reps - 20 seconds  hold  ASSESSMENT:  CLINICAL IMPRESSION: Pt demonstrates increased strength with ability to tolerate new resistive exercises.    OBJECTIVE IMPAIRMENTS: Abnormal gait, difficulty walking, decreased ROM, decreased strength, impaired flexibility, postural dysfunction, and pain.   ACTIVITY LIMITATIONS: bending, sitting, squatting, sleeping, and dancing  PARTICIPATION LIMITATIONS: driving and community activity  PERSONAL FACTORS: see PMH and pertinent history above are also affecting patient's functional outcome.   REHAB POTENTIAL: Good  CLINICAL DECISION MAKING: Stable/uncomplicated  EVALUATION COMPLEXITY: Low   GOALS: Goals reviewed  with patient? Yes  SHORT TERM GOALS: (target date for Short term goals are 3 weeks 01/09/2024)   1.  Patient will demonstrate independent use of home exercise program to maintain progress from in clinic treatments. Goal status: Met for initial HEP. 12/27/23  2. Pt will improve his 5 time sit to stand to </= 10 seconds with no UE support.   Goal status: New  LONG TERM GOALS: (target dates for all long term goals are 10 weeks 03/02/2024 )   1. Patient will demonstrate/report pain at worst less than or equal to 2/10 to facilitate minimal limitation in daily activity secondary to pain symptoms. Goal status: New   2. Patient will demonstrate independent use of home exercise program to facilitate ability to maintain/progress functional gains from skilled physical therapy services.  Goal status: New   3. Patient will demonstrate Patient specific functional scale avg > or = 7 to indicate reduced disability due to condition.  Goal status: New   4.  Patient will demonstrate Rt LE quad strength will increase by >/= 5 pounds throughout to faciltiate usual transfers, stairs, squatting at PLOF for daily life.  Goal status: New   5.  Patient will demonstrate up and down 1 flight of stairs with single hand rail with pain </= 2/10.  Goal status: New   6.  Pt will reporting return to dancing with pain </= 2/10.  Goal status: New    PLAN:  PT FREQUENCY: 1-2x/week  PT DURATION: 10 weeks  PLANNED INTERVENTIONS: Can include 02853- PT Re-evaluation, 97110-Therapeutic exercises, 97530- Therapeutic activity, [id number]- Neuromuscular re-education, 97535- Self Care, 97140- Manual therapy, [phone]- Gait training, [phone]- Orthotic Fit/training, [phone]- Canalith repositioning, [id number]- Aquatic Therapy, [id number]- Electrical stimulation (unattended), [id number] Physical performance testing, 97016- Vasopneumatic device, [id number]- Ultrasound, [id number]- Traction (mechanical), [id number]- Ionotophoresis 4mg /ml Dexamethasone ,  79439 - Needle  insertion w/o injection 1 or 2 muscles, 20561 - Needle insertion w/o injection 3 or more muscles.   Patient/Family education, Balance training, Stair training, Taping, Dry Needling, Joint mobilization, Joint manipulation, Spinal manipulation, Spinal mobilization, Scar mobilization, Vestibular training, Visual/preceptual remediation/compensation, DME instructions, Cryotherapy, and Moist heat.  All performed as medically necessary.  All included unless contraindicated  PLAN FOR NEXT SESSION: LE strengthening, lumbar mobs, core strengthening,     Burnard CHRISTELLA Meth, PT, MPT 01/05/2024, 11:05 AM  Date of referral: 7.8/25 Referring provider: Jerri Kay CHRISTELLA, MD  Referring diagnosis?  M79.604 (ICD-10-CM) - Pain in right leg  M54.16 (ICD-10-CM) - Radiculopathy, lumbar region   Treatment diagnosis? (if different than referring diagnosis) M54.59, r26.2, m62.81, m79.604  What was this (referring dx) caused by? Arthritis  Nature of Condition: Initial Onset (within last 3 months)   Laterality: Rt  Current Functional Measure Score: Patient Specific Functional Scale 5  Objective measurements identify impairments when they are compared to normal values, the uninvolved extremity, and prior level of function.  [x]  Yes  []  No  Objective assessment of functional ability: Minimal functional limitations   Briefly describe  symptoms: pain, weakness and radiation down Rt LE  How did symptoms start: over time  Average pain intensity:  Last 24 hours: 6/10  Past week: 7/10  How often does the pt experience symptoms? Frequently  How much have the symptoms interfered with usual daily activities? Moderately  How has condition changed since care began at this facility? No change first treatment visit  In general, how is the patients overall health? Very Good   BACK PAIN (STarT Back Screening Tool) Has pain spread down the leg(s) at some time in the last 2 weeks? yes Has there been pain in the shoulder  or neck at some time in the last 2 weeks? no Has the pt only walked short distances because of back pain? yes Has patient dressed more slowly because of back pain in the past 2 weeks? yes Does patient think it's not safe for a person with this condition to be physically active? no Does patient have worrying thoughts a lot of the time? yes Does patient feel back pain is terrible and will never get any better? no Has patient stopped enjoying things they usually enjoy? some

## 2024-01-09 ENCOUNTER — Ambulatory Visit: Admitting: Rehabilitative and Restorative Service Providers"

## 2024-01-09 ENCOUNTER — Encounter: Payer: Self-pay | Admitting: Rehabilitative and Restorative Service Providers"

## 2024-01-09 DIAGNOSIS — M5459 Other low back pain: Secondary | ICD-10-CM | POA: Diagnosis not present

## 2024-01-09 DIAGNOSIS — R262 Difficulty in walking, not elsewhere classified: Secondary | ICD-10-CM | POA: Diagnosis not present

## 2024-01-09 DIAGNOSIS — M79604 Pain in right leg: Secondary | ICD-10-CM | POA: Diagnosis not present

## 2024-01-09 DIAGNOSIS — M6281 Muscle weakness (generalized): Secondary | ICD-10-CM

## 2024-01-09 NOTE — Therapy (Signed)
 OUTPATIENT PHYSICAL THERAPY TREATMENT   Patient Name: Nicholas Rasmussen MRN: [id number] DOB:Sep 29, 1945, 78 y.o., male Today's Date: 01/09/2024  END OF SESSION:  PT End of Session - 01/09/24 1348     Visit Number 5    Number of Visits 20    Date for PT Re-Evaluation 03/02/24    Progress Note Due on Visit 10    PT Start Time 1347    PT Stop Time 1425    PT Time Calculation (min) 38 min    Activity Tolerance Patient tolerated treatment well    Behavior During Therapy [facility] for tasks assessed/performed              Past Medical History:  Diagnosis Date   Allergic rhinitis 05/07/2014   Arthritis    Diabetes mellitus without complication (HCC)    Dizziness 01/02/2016   GERD (gastroesophageal reflux disease)    Headache 03/13/2015   Hx of adenomatous colonic polyps 10/04/2023   Kidney lesion 07/22/2020   right, followed by urology   Left flank pain 01/02/2016   Low back pain 06/06/2015   Pain of left side of body 08/13/2015   Post herpetic neuralgia    Routine general medical examination at a health care facility 05/07/2014   Seizure disorder (HCC) 05/07/2014   Seizures (HCC)    Last seizure in the 70s.   Shingles    Stye 05/07/2014   Past Surgical History:  Procedure Laterality Date   COLONOSCOPY     KNEE SURGERY Left    left miniscus   TOTAL KNEE ARTHROPLASTY Left 02/28/2023   Procedure: TOTAL KNEE ARTHROPLASTY;  Surgeon: Jerri Kay HERO, MD;  Location: [facility] OR;  Service: Orthopedics;  Laterality: Left;   UPPER GASTROINTESTINAL ENDOSCOPY     uvala surgery     Patient Active Problem List   Diagnosis Date Noted   Hx of adenomatous colonic polyps 10/04/2023   Belching 08/26/2023   Status post total left knee replacement 02/28/2023   Renal mass, right; Bosniak 4 complex cyst; 1 cm 09/09/2022   Acute medial meniscus tear of left knee 02/04/2022   Primary osteoarthritis of left knee 02/04/2022   Aortic root dilatation (HCC) 12/18/2021   Slow transit constipation  12/08/2021   Left-sided chest wall pain 12/08/2021   Multiple pulmonary nodules 10/06/2021   Hyperlipidemia associated with type 2 diabetes mellitus (HCC) 08/19/2021   [name] 04/07/2021   Complex renal cyst 04/07/2021   Varicose veins of both lower extremities with pain 02/02/2019   Screening for colon cancer 09/19/2018   Type 2 diabetes mellitus with diabetic polyneuropathy, without long-term current use of insulin  (HCC) 03/29/2018   Diabetic neuropathy (HCC) 03/29/2018   Chronic pain of left knee 03/23/2018   Coronary artery disease involving native coronary artery of native heart with angina pectoris (HCC) 08/03/2017   Post herpetic neuralgia 07/29/2016   Dizziness 01/02/2016   Chronic midline low back pain without sciatica 06/06/2015   Nonintractable episodic headache 03/13/2015   GERD (gastroesophageal reflux disease) 11/15/2014   Seizure disorder (HCC) 05/07/2014   Allergic rhinitis 05/07/2014    PCP: Katheen Roselie Rockford, NP   REFERRING PROVIDER: Jerri Kay HERO, MD   REFERRING DIAG:  Diagnosis  M79.604 (ICD-10-CM) - Pain in right leg  M54.16 (ICD-10-CM) - Radiculopathy, lumbar region    THERAPY DIAG:  Other low back pain  Difficulty in walking, not elsewhere classified  Muscle weakness (generalized)  Pain in right leg  Rationale for Evaluation and Treatment:  Rehabilitation  ONSET DATE: 7-8 months  SUBJECTIVE:   SUBJECTIVE STATEMENT: Pt indicated having lower back pain upon arrival.  Pt indicated waking this morning with symptoms.  Pt indicated Saturday/Sunday was pretty good.    PERTINENT HISTORY: DM, seizure disorder, arthritis, see PMH above  PAIN:  NPRS scale: 6-8/10 Pain location: low back Pain description: achy, concentrated in low back mostly Aggravating factors: leaning and moving in certain positions Relieving factors: changing positions  PRECAUTIONS: None  WEIGHT BEARING RESTRICTIONS: No  FALLS:   Has patient fallen in last 6 months? No  LIVING ENVIRONMENT: Lives with: lives with their family and lives with their spouse Lives in: House/apartment Stairs: Yes: External: 15 steps; bilateral but cannot reach both Has following equipment at home: None  OCCUPATION: retired  PLOF: Independent  PATIENT GOALS: dance without pain   OBJECTIVE:   DIAGNOSTIC FINDINGS: Result Narrative 11/29/23  X-rays of the lumbar spine show degenerative changes.  No acute abnormalities.  PATIENT SURVEYS:  Patient-Specific Activity Scoring Scheme  0 represents "unable to perform." 10 represents "able to perform at prior level. 0 1 2 3 4 5 6 7 8 9  10 (Date and Score)   Activity Eval  12/19/23     1. dancing  6    2. Lifting items from floor (watermelon)  4    3. Stairs navigation 5   4.    5.    Score 5    Total score = sum of the activity scores/number of activities Minimum detectable change (90%CI) for average score = 2 points Minimum detectable change (90%CI) for single activity score = 3 points  COGNITION: 12/19/2023 Overall cognitive status: WFL    SENSATION: 12/19/2023 Numbness and tingling down Rt LE into lateral thigh to knee  POSTURE:  12/19/2023 rounded shoulders and forward head    LOWER EXTREMITY ROM:   ROM Right Eval 12/19/23  Left eval  Hip flexion 115 115  Hip extension    Hip abduction    Hip adduction    Hip internal rotation    Hip external rotation    Knee flexion    Knee extension    Ankle dorsiflexion    Ankle plantarflexion    Ankle inversion    Ankle eversion     (Blank rows = not tested)   LUMBAR ROM:   ROM AROM  Eval 12/19/2023 AROM 01/09/2024  Flexion 70 c pain in low back   Extension 10 c pain 25% WFL with end range pain  X5 in standing improved to 50% with reduce complaints.   Right lateral flexion 22 c pain   Left lateral flexion 25   Right rotation WFL   Left rotation WFL    (Blank rows = not tested)    LOWER  EXTREMITY MMT:  MMT Right Eval 12/19/23 Left Eval 12/19/2023  Hip flexion 40.3 44.5  Hip extension    Hip abduction    Hip adduction    Hip internal rotation    Hip external rotation    Knee flexion 63.8 65.4  Knee extension 53.1 60.2  Ankle dorsiflexion    Ankle plantarflexion    Ankle inversion    Ankle eversion     (Blank rows = not tested)  SPECIAL TESTS:  12/19/23 Slump test: positive on Rt   FUNCTIONAL TESTS:  01/09/2024: 5x sit to stand no UE: 12 seconds   12/19/23: 5 time sit to stand: 13.75 seconds no UE support  GAIT: 12/19/2023 Distance walked: clinic distances Assistive device  utilized: None Level of assistance: Complete Independence Comments: mild antalgic gait pattern with wide BOS                                                                                                                                                                        TODAY'S TREATMENT                                                                          DATE: 01/09/2024 Therex: Standing lumbar extension AROM 2 x 5  Lt sidelying regional rotation lumbar stretch 15 sec x 5 Supine hooklying trunk rotation 15 sec x 3 bilateral  Supine hooklying bridge 2-3 sec hold 2 x 10  Supine hooklying tband blue clam shell with contralateral leg hold x 20 bilateral  Supine hooklying pball press down UE 10 second hold x 10  5x sit to stand for LE strength.   HEP review with updates given.    Manual: Percussive device STM to Rt lumbar paraspinals, QL in Lt sidelying.    TODAY'S TREATMENT                                                                          DATE: 01/05/24   Rec bike level 5  8 minutes Door way QL stretch x 2 bil sides x 15 sec holds Standing trunk extension x 10 holding 5 sec Standing hip extension 2 x 10 bil LE alternating Supine bridges: 2 x 10 holding 5 sec Supine Tband hip abduction 3x10 Green Supine trunk rotation x 4 holding 30 sec bil  Supine marching with TA  activation 2x10 Percussion device to lumbar paraspinals and Rt piriformis, glutes and Rt IT band       TODAY'S TREATMENT                                                                          DATE:01/03/24 Rec bike level  5  8 minutes Door way QL stretch x 2 bil sides x 15 sec holds Standing trunk extension x 10 holding 5 sec Prone hip extension 2 x 10 bil LE alternating Supine bridges: 2 x 10 holding 5 sec Supine Tband hip abduction 3x10 Green Supine trunk rotation x 4 holding 30 sec bil  Percussion device to lumbar paraspinals and Rt piriformis, glutes and Rt IT band    TODAY'S TREATMENT                                                                          DATE:12/27/23 Therex: Nustep: 8 minutes, Level 6 UE/LE Supine bridges: 2 x 10 holding 5 sec Supine trunk rotation x 4 holding 30 sec bil  Supine modified 100's x 10 holding 10 sec Prone hip extension 2 x 10 bil LE alternating Door way QL stretch x 2 bil sides x 15 sec holds Standing trunk extension x 10 holding 5 sec Manual Lumbar grade 2-3 PA mobs L2-L5 Percussion device to lumbar paraspinals and Rt piriformis, glutes and Rt IT band  Self Care:  Self STM using tennis ball      TODAY'S TREATMENT                                                                          DATE: 12/19/23 Therex:  HEP instruction/performance c cues for techniques, handout provided.  Trial set performed of each for comprehension and symptom assessment.  See below for exercise list Self Care:  Sitting position edu to keep knees lower than hips with sitting and when driving a car Sleeping positions discussed and recommended   PATIENT EDUCATION:  01/09/2024 Education details: HEP update Person educated: Patient Education method: Programmer, multimedia, Demonstration, Verbal cues, and Handouts Education comprehension: verbalized understanding, returned demonstration, and verbal cues required  HOME EXERCISE PROGRAM: Access Code: [email or web address] URL:  [email or web address] Date: 01/09/2024 Prepared by: Ozell Silvan  Exercises - Standing Quadratus Lumborum Stretch with Doorway  - 1 x daily - 7 x weekly - 5 reps - 10 seconds hold - Standing Hip Hiking  - 2 x daily - 7 x weekly - 2 sets - 10 reps - Hooklying Single Knee to Chest Stretch  - 2 x daily - 7 x weekly - 3 reps - 20 seconds hold - Supine Lower Trunk Rotation  - 1-2 x daily - 7 x weekly - 3 reps - 20 seconds hold - Sidelying Lumbar Rotation Stretch  - 1-2 x daily - 7 x weekly - 1 sets - 3-5 reps - 15 hold - Supine Bridge  - 1-2 x daily - 7 x weekly - 1-2 sets - 10 reps - 2 hold - Standing Lumbar Extension  - 2-4 x daily - 7 x weekly - 1 sets - 5-10 reps  ASSESSMENT:  CLINICAL IMPRESSION: Lumbar symptoms seemed to improve with mobility improving based activity in clinic today.  Objective data showed improved lumbar mobility following interventions for mobility  gains.  No reported complaints of radicular symptoms noted within clinic today.  May continue to benefit from progressive mobility gains.   OBJECTIVE IMPAIRMENTS: Abnormal gait, difficulty walking, decreased ROM, decreased strength, impaired flexibility, postural dysfunction, and pain.   ACTIVITY LIMITATIONS: bending, sitting, squatting, sleeping, and dancing  PARTICIPATION LIMITATIONS: driving and community activity  PERSONAL FACTORS: see PMH and pertinent history above are also affecting patient's functional outcome.   REHAB POTENTIAL: Good  CLINICAL DECISION MAKING: Stable/uncomplicated  EVALUATION COMPLEXITY: Low   GOALS: Goals reviewed with patient? Yes  SHORT TERM GOALS: (target date for Short term goals are 3 weeks 01/09/2024)   1.  Patient will demonstrate independent use of home exercise program to maintain progress from in clinic treatments. Goal status: Met for initial HEP. 12/27/23  2. Pt will improve his 5 time sit to stand to </= 10 seconds with no UE support.   Goal status: partially  met 01/09/2024  LONG TERM GOALS: (target dates for all long term goals are 10 weeks 03/02/2024 )   1. Patient will demonstrate/report pain at worst less than or equal to 2/10 to facilitate minimal limitation in daily activity secondary to pain symptoms. Goal status: New   2. Patient will demonstrate independent use of home exercise program to facilitate ability to maintain/progress functional gains from skilled physical therapy services.  Goal status: New   3. Patient will demonstrate Patient specific functional scale avg > or = 7 to indicate reduced disability due to condition.  Goal status: New   4.  Patient will demonstrate Rt LE quad strength will increase by >/= 5 pounds throughout to faciltiate usual transfers, stairs, squatting at PLOF for daily life.  Goal status: New   5.  Patient will demonstrate up and down 1 flight of stairs with single hand rail with pain </= 2/10.  Goal status: New   6.  Pt will reporting return to dancing with pain </= 2/10.  Goal status: New    PLAN:  PT FREQUENCY: 1-2x/week  PT DURATION: 10 weeks  PLANNED INTERVENTIONS: Can include 02853- PT Re-evaluation, 97110-Therapeutic exercises, 97530- Therapeutic activity, [id number]- Neuromuscular re-education, 97535- Self Care, 97140- Manual therapy, [phone]- Gait training, [phone]- Orthotic Fit/training, [phone]- Canalith repositioning, [id number]- Aquatic Therapy, [phone]- Electrical stimulation (unattended), [id number] Physical performance testing, 97016- Vasopneumatic device, [id number]- Ultrasound, [id number]- Traction (mechanical), [id number]- Ionotophoresis 4mg /ml Dexamethasone ,  79439 - Needle insertion w/o injection 1 or 2 muscles, 20561 - Needle insertion w/o injection 3 or more muscles.   Patient/Family education, Balance training, Stair training, Taping, Dry Needling, Joint mobilization, Joint manipulation, Spinal manipulation, Spinal mobilization, Scar mobilization, Vestibular training, Visual/preceptual remediation/compensation, DME  instructions, Cryotherapy, and Moist heat.  All performed as medically necessary.  All included unless contraindicated  PLAN FOR NEXT SESSION: Lumbar mobility gains.     Ozell Silvan, PT, DPT, OCS, ATC 01/09/24  2:23 PM      Date of referral: 11/29/23 Referring provider: Jerri Kay HERO, MD  Referring diagnosis?  M79.604 (ICD-10-CM) - Pain in right leg  M54.16 (ICD-10-CM) - Radiculopathy, lumbar region   Treatment diagnosis? (if different than referring diagnosis) M54.59, r26.2, m62.81, m79.604  What was this (referring dx) caused by? Arthritis  Nature of Condition: Initial Onset (within last 3 months)   Laterality: Rt  Current Functional Measure Score: Patient Specific Functional Scale 5  Objective measurements identify impairments when they are compared to normal values, the uninvolved extremity, and prior level of function.  [x]  Yes  []  No  Objective assessment of functional  ability: Minimal functional limitations   Briefly describe symptoms: pain, weakness and radiation down Rt LE  How did symptoms start: over time  Average pain intensity:  Last 24 hours: 6/10  Past week: 7/10  How often does the pt experience symptoms? Frequently  How much have the symptoms interfered with usual daily activities? Moderately  How has condition changed since care began at this facility? No change first treatment visit  In general, how is the patients overall health? Very Good   BACK PAIN (STarT Back Screening Tool) Has pain spread down the leg(s) at some time in the last 2 weeks? yes Has there been pain in the shoulder or neck at some time in the last 2 weeks? no Has the pt only walked short distances because of back pain? yes Has patient dressed more slowly because of back pain in the past 2 weeks? yes Does patient think it's not safe for a person with this condition to be physically active? no Does patient have worrying thoughts a lot of the time? yes Does patient feel  back pain is terrible and will never get any better? no Has patient stopped enjoying things they usually enjoy? some

## 2024-01-11 NOTE — Therapy (Signed)
 OUTPATIENT PHYSICAL THERAPY TREATMENT   Patient Name: Nicholas Rasmussen MRN: [id number] DOB:1946/02/03, 78 y.o., male Today's Date: 01/12/2024  END OF SESSION:  PT End of Session - 01/12/24 1208     Visit Number 6    Number of Visits 20    Date for PT Re-Evaluation 03/02/24    Progress Note Due on Visit 10    PT Start Time 1147    PT Stop Time 1225    PT Time Calculation (min) 38 min    Activity Tolerance Patient tolerated treatment well    Behavior During Therapy [facility] for tasks assessed/performed               Past Medical History:  Diagnosis Date   Allergic rhinitis 05/07/2014   Arthritis    Diabetes mellitus without complication (HCC)    Dizziness 01/02/2016   GERD (gastroesophageal reflux disease)    Headache 03/13/2015   Hx of adenomatous colonic polyps 10/04/2023   Kidney lesion 07/22/2020   right, followed by urology   Left flank pain 01/02/2016   Low back pain 06/06/2015   Pain of left side of body 08/13/2015   Post herpetic neuralgia    Routine general medical examination at a health care facility 05/07/2014   Seizure disorder (HCC) 05/07/2014   Seizures (HCC)    Last seizure in the 70s.   Shingles    Stye 05/07/2014   Past Surgical History:  Procedure Laterality Date   COLONOSCOPY     KNEE SURGERY Left    left miniscus   TOTAL KNEE ARTHROPLASTY Left 02/28/2023   Procedure: TOTAL KNEE ARTHROPLASTY;  Surgeon: Jerri Kay HERO, MD;  Location: [facility] OR;  Service: Orthopedics;  Laterality: Left;   UPPER GASTROINTESTINAL ENDOSCOPY     uvala surgery     Patient Active Problem List   Diagnosis Date Noted   Hx of adenomatous colonic polyps 10/04/2023   Belching 08/26/2023   Status post total left knee replacement 02/28/2023   Renal mass, right; Bosniak 4 complex cyst; 1 cm 09/09/2022   Acute medial meniscus tear of left knee 02/04/2022   Primary osteoarthritis of left knee 02/04/2022   Aortic root dilatation (HCC) 12/18/2021   Slow transit constipation  12/08/2021   Left-sided chest wall pain 12/08/2021   Multiple pulmonary nodules 10/06/2021   Hyperlipidemia associated with type 2 diabetes mellitus (HCC) 08/19/2021   [name] 04/07/2021   Complex renal cyst 04/07/2021   Varicose veins of both lower extremities with pain 02/02/2019   Screening for colon cancer 09/19/2018   Type 2 diabetes mellitus with diabetic polyneuropathy, without long-term current use of insulin  (HCC) 03/29/2018   Diabetic neuropathy (HCC) 03/29/2018   Chronic pain of left knee 03/23/2018   Coronary artery disease involving native coronary artery of native heart with angina pectoris (HCC) 08/03/2017   Post herpetic neuralgia 07/29/2016   Dizziness 01/02/2016   Chronic midline low back pain without sciatica 06/06/2015   Nonintractable episodic headache 03/13/2015   GERD (gastroesophageal reflux disease) 11/15/2014   Seizure disorder (HCC) 05/07/2014   Allergic rhinitis 05/07/2014    PCP: Katheen Roselie Rockford, NP   REFERRING PROVIDER: Jerri Kay HERO, MD   REFERRING DIAG:  Diagnosis  M79.604 (ICD-10-CM) - Pain in right leg  M54.16 (ICD-10-CM) - Radiculopathy, lumbar region    THERAPY DIAG:  Other low back pain  Difficulty in walking, not elsewhere classified  Muscle weakness (generalized)  Pain in right leg  Rationale for Evaluation and  Treatment: Rehabilitation  ONSET DATE: 7-8 months  SUBJECTIVE:   SUBJECTIVE STATEMENT: Pt reports recent LBP increase has resolved.  PERTINENT HISTORY: DM, seizure disorder, arthritis, see PMH above  PAIN:  NPRS scale: 4/10 Pain location: low back Pain description: achy, concentrated in low back mostly Aggravating factors: leaning and moving in certain positions Relieving factors: changing positions  PRECAUTIONS: None  WEIGHT BEARING RESTRICTIONS: No  FALLS:  Has patient fallen in last 6 months? No  LIVING ENVIRONMENT: Lives with: lives with their family and  lives with their spouse Lives in: House/apartment Stairs: Yes: External: 15 steps; bilateral but cannot reach both Has following equipment at home: None  OCCUPATION: retired  PLOF: Independent  PATIENT GOALS: dance without pain   OBJECTIVE:   DIAGNOSTIC FINDINGS: Result Narrative 11/29/23  X-rays of the lumbar spine show degenerative changes.  No acute abnormalities.  PATIENT SURVEYS:  Patient-Specific Activity Scoring Scheme  0 represents "unable to perform." 10 represents "able to perform at prior level. 0 1 2 3 4 5 6 7 8 9  10 (Date and Score)   Activity Eval  12/19/23     1. dancing  6    2. Lifting items from floor (watermelon)  4    3. Stairs navigation 5   4.    5.    Score 5    Total score = sum of the activity scores/number of activities Minimum detectable change (90%CI) for average score = 2 points Minimum detectable change (90%CI) for single activity score = 3 points  COGNITION: 12/19/2023 Overall cognitive status: WFL    SENSATION: 12/19/2023 Numbness and tingling down Rt LE into lateral thigh to knee  POSTURE:  12/19/2023 rounded shoulders and forward head    LOWER EXTREMITY ROM:   ROM Right Eval 12/19/23  Left eval  Hip flexion 115 115  Hip extension    Hip abduction    Hip adduction    Hip internal rotation    Hip external rotation    Knee flexion    Knee extension    Ankle dorsiflexion    Ankle plantarflexion    Ankle inversion    Ankle eversion     (Blank rows = not tested)   LUMBAR ROM:   ROM AROM  Eval 12/19/2023 AROM 01/09/2024  Flexion 70 c pain in low back   Extension 10 c pain 25% WFL with end range pain  X5 in standing improved to 50% with reduce complaints.   Right lateral flexion 22 c pain   Left lateral flexion 25   Right rotation WFL   Left rotation WFL    (Blank rows = not tested)    LOWER EXTREMITY MMT:  MMT Right Eval 12/19/23 Left Eval 12/19/2023  Hip flexion 40.3 44.5  Hip extension    Hip  abduction    Hip adduction    Hip internal rotation    Hip external rotation    Knee flexion 63.8 65.4  Knee extension 53.1 60.2  Ankle dorsiflexion    Ankle plantarflexion    Ankle inversion    Ankle eversion     (Blank rows = not tested)  SPECIAL TESTS:  12/19/23 Slump test: positive on Rt   FUNCTIONAL TESTS:  01/09/2024: 5x sit to stand no UE: 12 seconds   12/19/23: 5 time sit to stand: 13.75 seconds no UE support  GAIT: 12/19/2023 Distance walked: clinic distances Assistive device utilized: None Level of assistance: Complete Independence Comments: mild antalgic gait pattern with wide BOS  TODAY'S TREATMENT                                                                          DATE:  01/12/24   Therex: Prone hip ext 10x each leg Supine hooklying trunk rotation 15 sec x 3 bilateral  Supine hooklying bridge 2-3 sec hold 3 x 10 with Green Tband Supine hooklying tband blue clam shell with contralateral leg hold x 20 bilateral  Open Book 10x10 sec each way Neuro Re Ed 5x3 sit to stand with tap onto 8 inch step for LE strength and balance Standing lumbar extension AROM 2 x 5    01/09/2024 Therex: Standing lumbar extension AROM 2 x 5  Lt sidelying regional rotation lumbar stretch 15 sec x 5 Supine hooklying trunk rotation 15 sec x 3 bilateral  Supine hooklying bridge 2-3 sec hold 2 x 10  Supine hooklying tband blue clam shell with contralateral leg hold x 20 bilateral  Supine hooklying pball press down UE 10 second hold x 10  5x sit to stand for LE strength.   HEP review with updates given.    Manual: Percussive device STM to Rt lumbar paraspinals, QL in Lt sidelying.    TODAY'S TREATMENT                                                                          DATE: 01/05/24   Rec bike level 5  8  minutes Door way QL stretch x 2 bil sides x 15 sec holds Standing trunk extension x 10 holding 5 sec Standing hip extension 2 x 10 bil LE alternating Supine bridges: 2 x 10 holding 5 sec Supine Tband hip abduction 3x10 Green Supine trunk rotation x 4 holding 30 sec bil  Supine marching with TA activation 2x10 Percussion device to lumbar paraspinals and Rt piriformis, glutes and Rt IT band       TODAY'S TREATMENT                                                                          DATE:01/03/24 Rec bike level 5  8 minutes Door way QL stretch x 2 bil sides x 15 sec holds Standing trunk extension x 10 holding 5 sec Prone hip extension 2 x 10 bil LE alternating Supine bridges: 2 x 10 holding 5 sec Supine Tband hip abduction 3x10 Green Supine trunk rotation x 4 holding 30 sec bil  Percussion device to lumbar paraspinals and Rt piriformis, glutes and Rt IT band    TODAY'S TREATMENT  DATE:12/27/23 Therex: Nustep: 8 minutes, Level 6 UE/LE Supine bridges: 2 x 10 holding 5 sec Supine trunk rotation x 4 holding 30 sec bil  Supine modified 100's x 10 holding 10 sec Prone hip extension 2 x 10 bil LE alternating Door way QL stretch x 2 bil sides x 15 sec holds Standing trunk extension x 10 holding 5 sec Manual Lumbar grade 2-3 PA mobs L2-L5 Percussion device to lumbar paraspinals and Rt piriformis, glutes and Rt IT band  Self Care:  Self STM using tennis ball      TODAY'S TREATMENT                                                                          DATE: 12/19/23 Therex:  HEP instruction/performance c cues for techniques, handout provided.  Trial set performed of each for comprehension and symptom assessment.  See below for exercise list Self Care:  Sitting position edu to keep knees lower than hips with sitting and when driving a car Sleeping positions discussed and recommended   PATIENT EDUCATION:   01/09/2024 Education details: HEP update Person educated: Patient Education method: Programmer, multimedia, Demonstration, Verbal cues, and Handouts Education comprehension: verbalized understanding, returned demonstration, and verbal cues required  HOME EXERCISE PROGRAM: Access Code: [email or web address] URL: [email or web address] Date: 01/09/2024 Prepared by: Ozell Silvan  Exercises - Standing Quadratus Lumborum Stretch with Doorway  - 1 x daily - 7 x weekly - 5 reps - 10 seconds hold - Standing Hip Hiking  - 2 x daily - 7 x weekly - 2 sets - 10 reps - Hooklying Single Knee to Chest Stretch  - 2 x daily - 7 x weekly - 3 reps - 20 seconds hold - Supine Lower Trunk Rotation  - 1-2 x daily - 7 x weekly - 3 reps - 20 seconds hold - Sidelying Lumbar Rotation Stretch  - 1-2 x daily - 7 x weekly - 1 sets - 3-5 reps - 15 hold - Supine Bridge  - 1-2 x daily - 7 x weekly - 1-2 sets - 10 reps - 2 hold - Standing Lumbar Extension  - 2-4 x daily - 7 x weekly - 1 sets - 5-10 reps  ASSESSMENT:  CLINICAL IMPRESSION:  Needed VC for standing balance exercise.  Demonstrated understanding. OBJECTIVE IMPAIRMENTS: Abnormal gait, difficulty walking, decreased ROM, decreased strength, impaired flexibility, postural dysfunction, and pain.   ACTIVITY LIMITATIONS: bending, sitting, squatting, sleeping, and dancing  PARTICIPATION LIMITATIONS: driving and community activity  PERSONAL FACTORS: see PMH and pertinent history above are also affecting patient's functional outcome.   REHAB POTENTIAL: Good  CLINICAL DECISION MAKING: Stable/uncomplicated  EVALUATION COMPLEXITY: Low   GOALS: Goals reviewed with patient? Yes  SHORT TERM GOALS: (target date for Short term goals are 3 weeks 01/09/2024)   1.  Patient will demonstrate independent use of home exercise program to maintain progress from in clinic treatments. Goal status: Met for initial HEP. 12/27/23  2. Pt will improve his 5 time sit to stand to </= 10  seconds with no UE support.   Goal status: partially met 01/09/2024  LONG TERM GOALS: (target dates for all long term goals are 10 weeks 03/02/2024 )   1. Patient will demonstrate/report pain at  worst less than or equal to 2/10 to facilitate minimal limitation in daily activity secondary to pain symptoms. Goal status: New   2. Patient will demonstrate independent use of home exercise program to facilitate ability to maintain/progress functional gains from skilled physical therapy services.  Goal status: New   3. Patient will demonstrate Patient specific functional scale avg > or = 7 to indicate reduced disability due to condition.  Goal status: New   4.  Patient will demonstrate Rt LE quad strength will increase by >/= 5 pounds throughout to faciltiate usual transfers, stairs, squatting at PLOF for daily life.  Goal status: New   5.  Patient will demonstrate up and down 1 flight of stairs with single hand rail with pain </= 2/10.  Goal status: New   6.  Pt will reporting return to dancing with pain </= 2/10.  Goal status: New    PLAN:  PT FREQUENCY: 1-2x/week  PT DURATION: 10 weeks  PLANNED INTERVENTIONS: Can include 02853- PT Re-evaluation, 97110-Therapeutic exercises, 97530- Therapeutic activity, [id number]- Neuromuscular re-education, 97535- Self Care, 97140- Manual therapy, [phone]- Gait training, [phone]- Orthotic Fit/training, [phone]- Canalith repositioning, [id number]- Aquatic Therapy, [phone]- Electrical stimulation (unattended), [id number] Physical performance testing, 97016- Vasopneumatic device, [id number]- Ultrasound, [id number]- Traction (mechanical), [id number]- Ionotophoresis 4mg /ml Dexamethasone ,  79439 - Needle insertion w/o injection 1 or 2 muscles, 20561 - Needle insertion w/o injection 3 or more muscles.   Patient/Family education, Balance training, Stair training, Taping, Dry Needling, Joint mobilization, Joint manipulation, Spinal manipulation, Spinal mobilization, Scar mobilization, Vestibular  training, Visual/preceptual remediation/compensation, DME instructions, Cryotherapy, and Moist heat.  All performed as medically necessary.  All included unless contraindicated  PLAN FOR NEXT SESSION: Lumbar mobility gains.     Burnard Meth, PT 01/12/24  12:11 PM      Date of referral: 11/29/23 Referring provider: Jerri Kay HERO, MD  Referring diagnosis?  M79.604 (ICD-10-CM) - Pain in right leg  M54.16 (ICD-10-CM) - Radiculopathy, lumbar region   Treatment diagnosis? (if different than referring diagnosis) M54.59, r26.2, m62.81, m79.604  What was this (referring dx) caused by? Arthritis  Nature of Condition: Initial Onset (within last 3 months)   Laterality: Rt  Current Functional Measure Score: Patient Specific Functional Scale 5  Objective measurements identify impairments when they are compared to normal values, the uninvolved extremity, and prior level of function.  [x]  Yes  []  No  Objective assessment of functional ability: Minimal functional limitations   Briefly describe symptoms: pain, weakness and radiation down Rt LE  How did symptoms start: over time  Average pain intensity:  Last 24 hours: 6/10  Past week: 7/10  How often does the pt experience symptoms? Frequently  How much have the symptoms interfered with usual daily activities? Moderately  How has condition changed since care began at this facility? No change first treatment visit  In general, how is the patients overall health? Very Good   BACK PAIN (STarT Back Screening Tool) Has pain spread down the leg(s) at some time in the last 2 weeks? yes Has there been pain in the shoulder or neck at some time in the last 2 weeks? no Has the pt only walked short distances because of back pain? yes Has patient dressed more slowly because of back pain in the past 2 weeks? yes Does patient think it's not safe for a person with this condition to be physically active? no Does patient have worrying thoughts a  lot of the time? yes Does patient feel back pain is terrible  and will never get any better? no Has patient stopped enjoying things they usually enjoy? some

## 2024-01-12 ENCOUNTER — Ambulatory Visit

## 2024-01-12 DIAGNOSIS — R262 Difficulty in walking, not elsewhere classified: Secondary | ICD-10-CM | POA: Diagnosis not present

## 2024-01-12 DIAGNOSIS — M5459 Other low back pain: Secondary | ICD-10-CM

## 2024-01-12 DIAGNOSIS — M6281 Muscle weakness (generalized): Secondary | ICD-10-CM

## 2024-01-12 DIAGNOSIS — M79604 Pain in right leg: Secondary | ICD-10-CM

## 2024-01-15 ENCOUNTER — Other Ambulatory Visit: Payer: Self-pay | Admitting: Nurse Practitioner

## 2024-01-15 DIAGNOSIS — I25119 Atherosclerotic heart disease of native coronary artery with unspecified angina pectoris: Secondary | ICD-10-CM

## 2024-01-17 ENCOUNTER — Ambulatory Visit: Admitting: Physical Therapy

## 2024-01-17 ENCOUNTER — Encounter: Payer: Self-pay | Admitting: Physical Therapy

## 2024-01-17 DIAGNOSIS — M5459 Other low back pain: Secondary | ICD-10-CM | POA: Diagnosis not present

## 2024-01-17 DIAGNOSIS — M6281 Muscle weakness (generalized): Secondary | ICD-10-CM

## 2024-01-17 DIAGNOSIS — M79604 Pain in right leg: Secondary | ICD-10-CM | POA: Diagnosis not present

## 2024-01-17 DIAGNOSIS — R262 Difficulty in walking, not elsewhere classified: Secondary | ICD-10-CM

## 2024-01-17 NOTE — Therapy (Signed)
 OUTPATIENT PHYSICAL THERAPY TREATMENT   Patient Name: Nicholas Rasmussen MRN: [id number] DOB:[birth year], 78 y.o., male Today's Date: 01/17/2024  END OF SESSION:  PT End of Session - 01/17/24 1514     Visit Number 7    Number of Visits 20    Date for PT Re-Evaluation 03/02/24    PT Start Time 1430    PT Stop Time 1510    PT Time Calculation (min) 40 min    Activity Tolerance Patient tolerated treatment well    Behavior During Therapy [facility] for tasks assessed/performed                Past Medical History:  Diagnosis Date   Allergic rhinitis 05/07/2014   Arthritis    Diabetes mellitus without complication (HCC)    Dizziness 01/02/2016   GERD (gastroesophageal reflux disease)    Headache 03/13/2015   Hx of adenomatous colonic polyps 10/04/2023   Kidney lesion 07/22/2020   right, followed by urology   Left flank pain 01/02/2016   Low back pain 06/06/2015   Pain of left side of body 08/13/2015   Post herpetic neuralgia    Routine general medical examination at a health care facility 05/07/2014   Seizure disorder (HCC) 05/07/2014   Seizures (HCC)    Last seizure in the 70s.   Shingles    Stye 05/07/2014   Past Surgical History:  Procedure Laterality Date   COLONOSCOPY     KNEE SURGERY Left    left miniscus   TOTAL KNEE ARTHROPLASTY Left 02/28/2023   Procedure: TOTAL KNEE ARTHROPLASTY;  Surgeon: Jerri Kay HERO, MD;  Location: [facility] OR;  Service: Orthopedics;  Laterality: Left;   UPPER GASTROINTESTINAL ENDOSCOPY     uvala surgery     Patient Active Problem List   Diagnosis Date Noted   Hx of adenomatous colonic polyps 10/04/2023   Belching 08/26/2023   Status post total left knee replacement 02/28/2023   Renal mass, right; Bosniak 4 complex cyst; 1 cm 09/09/2022   Acute medial meniscus tear of left knee 02/04/2022   Primary osteoarthritis of left knee 02/04/2022   Aortic root dilatation (HCC) 12/18/2021   Slow transit constipation 12/08/2021   Left-sided chest wall  pain 12/08/2021   Multiple pulmonary nodules 10/06/2021   Hyperlipidemia associated with type 2 diabetes mellitus (HCC) 08/19/2021   [name] 04/07/2021   Complex renal cyst 04/07/2021   Varicose veins of both lower extremities with pain 02/02/2019   Screening for colon cancer 09/19/2018   Type 2 diabetes mellitus with diabetic polyneuropathy, without long-term current use of insulin  (HCC) 03/29/2018   Diabetic neuropathy (HCC) 03/29/2018   Chronic pain of left knee 03/23/2018   Coronary artery disease involving native coronary artery of native heart with angina pectoris (HCC) 08/03/2017   Post herpetic neuralgia 07/29/2016   Dizziness 01/02/2016   Chronic midline low back pain without sciatica 06/06/2015   Nonintractable episodic headache 03/13/2015   GERD (gastroesophageal reflux disease) 11/15/2014   Seizure disorder (HCC) 05/07/2014   Allergic rhinitis 05/07/2014    PCP: Katheen Roselie Rockford, NP   REFERRING PROVIDER: Jerri Kay HERO, MD   REFERRING DIAG:  Diagnosis  M79.604 (ICD-10-CM) - Pain in right leg  M54.16 (ICD-10-CM) - Radiculopathy, lumbar region    THERAPY DIAG:  Other low back pain  Difficulty in walking, not elsewhere classified  Muscle weakness (generalized)  Pain in right leg  Rationale for Evaluation and Treatment: Rehabilitation  ONSET DATE: 7-8 months  SUBJECTIVE:   SUBJECTIVE STATEMENT: Pt reporting 3-4/10 pain in his Rt side low back.   PERTINENT HISTORY: DM, seizure disorder, arthritis, see PMH above  PAIN:  NPRS scale: 3-4/10 Pain location: low back Pain description: achy, concentrated in low back mostly Aggravating factors: leaning and moving in certain positions Relieving factors: changing positions  PRECAUTIONS: None  WEIGHT BEARING RESTRICTIONS: No  FALLS:  Has patient fallen in last 6 months? No  LIVING ENVIRONMENT: Lives with: lives with their family and lives with their  spouse Lives in: House/apartment Stairs: Yes: External: 15 steps; bilateral but cannot reach both Has following equipment at home: None  OCCUPATION: retired  PLOF: Independent  PATIENT GOALS: dance without pain   OBJECTIVE:   DIAGNOSTIC FINDINGS: Result Narrative 11/29/23  X-rays of the lumbar spine show degenerative changes.  No acute abnormalities.  PATIENT SURVEYS:  Patient-Specific Activity Scoring Scheme  0 represents "unable to perform." 10 represents "able to perform at prior level. 0 1 2 3 4 5 6 7 8 9  10 (Date and Score)   Activity Eval  12/19/23     1. dancing  6    2. Lifting items from floor (watermelon)  4    3. Stairs navigation 5   4.    5.    Score 5    Total score = sum of the activity scores/number of activities Minimum detectable change (90%CI) for average score = 2 points Minimum detectable change (90%CI) for single activity score = 3 points  COGNITION: 12/19/2023 Overall cognitive status: WFL    SENSATION: 12/19/2023 Numbness and tingling down Rt LE into lateral thigh to knee  POSTURE:  12/19/2023 rounded shoulders and forward head    LOWER EXTREMITY ROM:   ROM Right Eval 12/19/23  Left eval  Hip flexion 115 115  Hip extension    Hip abduction    Hip adduction    Hip internal rotation    Hip external rotation    Knee flexion    Knee extension    Ankle dorsiflexion    Ankle plantarflexion    Ankle inversion    Ankle eversion     (Blank rows = not tested)   LUMBAR ROM:   ROM AROM  Eval 12/19/2023 AROM 01/09/2024  Flexion 70 c pain in low back   Extension 10 c pain 25% WFL with end range pain  X5 in standing improved to 50% with reduce complaints.   Right lateral flexion 22 c pain   Left lateral flexion 25   Right rotation WFL   Left rotation WFL    (Blank rows = not tested)    LOWER EXTREMITY MMT:  MMT Right Eval 12/19/23 Left Eval 12/19/2023  Hip flexion 40.3 44.5  Hip extension    Hip abduction    Hip  adduction    Hip internal rotation    Hip external rotation    Knee flexion 63.8 65.4  Knee extension 53.1 60.2  Ankle dorsiflexion    Ankle plantarflexion    Ankle inversion    Ankle eversion     (Blank rows = not tested)  SPECIAL TESTS:  12/19/23 Slump test: positive on Rt   FUNCTIONAL TESTS:  01/09/2024: 5x sit to stand no UE: 12 seconds   12/19/23: 5 time sit to stand: 13.75 seconds no UE support  GAIT: 12/19/2023 Distance walked: clinic distances Assistive device utilized: None Level of assistance: Complete Independence Comments: mild antalgic gait pattern with wide BOS  TODAY'S TREATMENT                                                                          DATE:  01/17/24   TherEx:  Cat/cow: x 10 from raised mat table (lumbar/thoracic ROM) Prone: knees bent, trunk rotation x 4 bil holding 20 sec TherActivites:  Prone: opposite arm/leg  2 x 10 (core strengthening, shoulder ROM, hip extension) Prone: knee bent: reverse clams green TB 2 x 10 (hip mobility, hip strengthening) Sit to stand: 12# kettle bell 2 x 10  Leg Press: 100# 3 x 10 bil LE's (for improvements in functional squats) Manual:  Percussion and STM to pt's bil lumbar paraspinals, QL with concentration on Rt side    TODAY'S TREATMENT                                                                          DATE:  01/12/24   Therex: Prone hip ext 10x each leg Supine hooklying trunk rotation 15 sec x 3 bilateral  Supine hooklying bridge 2-3 sec hold 3 x 10 with Green Tband Supine hooklying tband blue clam shell with contralateral leg hold x 20 bilateral  Open Book 10x10 sec each way Neuro Re Ed 5x3 sit to stand with tap onto 8 inch step for LE strength and balance Standing lumbar extension AROM 2 x 5    01/09/2024 Therex: Standing lumbar  extension AROM 2 x 5  Lt sidelying regional rotation lumbar stretch 15 sec x 5 Supine hooklying trunk rotation 15 sec x 3 bilateral  Supine hooklying bridge 2-3 sec hold 2 x 10  Supine hooklying tband blue clam shell with contralateral leg hold x 20 bilateral  Supine hooklying pball press down UE 10 second hold x 10  5x sit to stand for LE strength.   HEP review with updates given.    Manual: Percussive device STM to Rt lumbar paraspinals, QL in Lt sidelying.    TODAY'S TREATMENT                                                                          DATE: 01/05/24   Rec bike level 5  8 minutes Door way QL stretch x 2 bil sides x 15 sec holds Standing trunk extension x 10 holding 5 sec Standing hip extension 2 x 10 bil LE alternating Supine bridges: 2 x 10 holding 5 sec Supine Tband hip abduction 3x10 Green Supine trunk rotation x 4 holding 30 sec bil  Supine marching with TA activation 2x10 Percussion device to lumbar paraspinals and Rt piriformis, glutes and Rt IT band       TODAY'S TREATMENT  DATE:01/03/24 Rec bike level 5  8 minutes Door way QL stretch x 2 bil sides x 15 sec holds Standing trunk extension x 10 holding 5 sec Prone hip extension 2 x 10 bil LE alternating Supine bridges: 2 x 10 holding 5 sec Supine Tband hip abduction 3x10 Green Supine trunk rotation x 4 holding 30 sec bil  Percussion device to lumbar paraspinals and Rt piriformis, glutes and Rt IT band    TODAY'S TREATMENT                                                                          DATE:12/27/23 Therex: Nustep: 8 minutes, Level 6 UE/LE Supine bridges: 2 x 10 holding 5 sec Supine trunk rotation x 4 holding 30 sec bil  Supine modified 100's x 10 holding 10 sec Prone hip extension 2 x 10 bil LE alternating Door way QL stretch x 2 bil sides x 15 sec holds Standing trunk extension x 10 holding 5 sec Manual Lumbar grade  2-3 PA mobs L2-L5 Percussion device to lumbar paraspinals and Rt piriformis, glutes and Rt IT band  Self Care:  Self STM using tennis ball       PATIENT EDUCATION:  01/09/2024 Education details: HEP update Person educated: Patient Education method: Programmer, multimedia, Demonstration, Verbal cues, and Handouts Education comprehension: verbalized understanding, returned demonstration, and verbal cues required  HOME EXERCISE PROGRAM: Access Code: [email or web address] URL: [email or web address] Date: 01/09/2024 Prepared by: Ozell Silvan  Exercises - Standing Quadratus Lumborum Stretch with Doorway  - 1 x daily - 7 x weekly - 5 reps - 10 seconds hold - Standing Hip Hiking  - 2 x daily - 7 x weekly - 2 sets - 10 reps - Hooklying Single Knee to Chest Stretch  - 2 x daily - 7 x weekly - 3 reps - 20 seconds hold - Supine Lower Trunk Rotation  - 1-2 x daily - 7 x weekly - 3 reps - 20 seconds hold - Sidelying Lumbar Rotation Stretch  - 1-2 x daily - 7 x weekly - 1 sets - 3-5 reps - 15 hold - Supine Bridge  - 1-2 x daily - 7 x weekly - 1-2 sets - 10 reps - 2 hold - Standing Lumbar Extension  - 2-4 x daily - 7 x weekly - 1 sets - 5-10 reps  ASSESSMENT:  CLINICAL IMPRESSION: Pt tolerating core strengthening and lumbar mobility exercises well. Pt wishing to have percussor to his low back. Pt stating at end of session, he is feeling better than when her arrived. Recommending continued skilled PT.    OBJECTIVE IMPAIRMENTS: Abnormal gait, difficulty walking, decreased ROM, decreased strength, impaired flexibility, postural dysfunction, and pain.   ACTIVITY LIMITATIONS: bending, sitting, squatting, sleeping, and dancing  PARTICIPATION LIMITATIONS: driving and community activity  PERSONAL FACTORS: see PMH and pertinent history above are also affecting patient's functional outcome.   REHAB POTENTIAL: Good  CLINICAL DECISION MAKING: Stable/uncomplicated  EVALUATION COMPLEXITY:  Low   GOALS: Goals reviewed with patient? Yes  SHORT TERM GOALS: (target date for Short term goals are 3 weeks 01/09/2024)   1.  Patient will demonstrate independent use of home exercise program to maintain progress from in clinic treatments. Goal status: Met for initial  HEP. 12/27/23  2. Pt will improve his 5 time sit to stand to </= 10 seconds with no UE support.   Goal status: partially met 01/09/2024  LONG TERM GOALS: (target dates for all long term goals are 10 weeks 03/02/2024 )   1. Patient will demonstrate/report pain at worst less than or equal to 2/10 to facilitate minimal limitation in daily activity secondary to pain symptoms. Goal status: New   2. Patient will demonstrate independent use of home exercise program to facilitate ability to maintain/progress functional gains from skilled physical therapy services.  Goal status: New   3. Patient will demonstrate Patient specific functional scale avg > or = 7 to indicate reduced disability due to condition.  Goal status: New   4.  Patient will demonstrate Rt LE quad strength will increase by >/= 5 pounds throughout to faciltiate usual transfers, stairs, squatting at PLOF for daily life.  Goal status: New   5.  Patient will demonstrate up and down 1 flight of stairs with single hand rail with pain </= 2/10.  Goal status: New   6.  Pt will reporting return to dancing with pain </= 2/10.  Goal status: New    PLAN:  PT FREQUENCY: 1-2x/week  PT DURATION: 10 weeks  PLANNED INTERVENTIONS: Can include 02853- PT Re-evaluation, 97110-Therapeutic exercises, 97530- Therapeutic activity, [id number]- Neuromuscular re-education, 97535- Self Care, 97140- Manual therapy, [phone]- Gait training, [phone]- Orthotic Fit/training, [phone]- Canalith repositioning, [id number]- Aquatic Therapy, [phone]- Electrical stimulation (unattended), [id number] Physical performance testing, 97016- Vasopneumatic device, [id number]- Ultrasound, [id number]- Traction (mechanical), [id number]-  Ionotophoresis 4mg /ml Dexamethasone ,  [id number] - Needle insertion w/o injection 1 or 2 muscles, 20561 - Needle insertion w/o injection 3 or more muscles.   Patient/Family education, Balance training, Stair training, Taping, Dry Needling, Joint mobilization, Joint manipulation, Spinal manipulation, Spinal mobilization, Scar mobilization, Vestibular training, Visual/preceptual remediation/compensation, DME instructions, Cryotherapy, and Moist heat.  All performed as medically necessary.  All included unless contraindicated  PLAN FOR NEXT SESSION: Lumbar mobility gains.     Delon Lunger, PT, MPT 01/17/24 3:16 PM   01/17/24  3:16 PM      Date of referral: 11/29/23 Referring provider: Jerri Kay HERO, MD  Referring diagnosis?  M79.604 (ICD-10-CM) - Pain in right leg  M54.16 (ICD-10-CM) - Radiculopathy, lumbar region   Treatment diagnosis? (if different than referring diagnosis) M54.59, r26.2, m62.81, m79.604  What was this (referring dx) caused by? Arthritis  Nature of Condition: Initial Onset (within last 3 months)   Laterality: Rt  Current Functional Measure Score: Patient Specific Functional Scale 5  Objective measurements identify impairments when they are compared to normal values, the uninvolved extremity, and prior level of function.  [x]  Yes  []  No  Objective assessment of functional ability: Minimal functional limitations   Briefly describe symptoms: pain, weakness and radiation down Rt LE  How did symptoms start: over time  Average pain intensity:  Last 24 hours: 6/10  Past week: 7/10  How often does the pt experience symptoms? Frequently  How much have the symptoms interfered with usual daily activities? Moderately  How has condition changed since care began at this facility? No change first treatment visit  In general, how is the patients overall health? Very Good   BACK PAIN (STarT Back Screening Tool) Has pain spread down the leg(s) at some time in the  last 2 weeks? yes Has there been pain in the shoulder or neck at some time in the last 2 weeks? no Has the pt only  walked short distances because of back pain? yes Has patient dressed more slowly because of back pain in the past 2 weeks? yes Does patient think it's not safe for a person with this condition to be physically active? no Does patient have worrying thoughts a lot of the time? yes Does patient feel back pain is terrible and will never get any better? no Has patient stopped enjoying things they usually enjoy? some

## 2024-01-18 NOTE — Therapy (Signed)
 OUTPATIENT PHYSICAL THERAPY TREATMENT   Patient Name: Nicholas Rasmussen MRN: [id number] DOB:January 31, 1946, 78 y.o., male Today's Date: 01/19/2024  END OF SESSION:  PT End of Session - 01/19/24 1612     Visit Number 8    Number of Visits 20    Date for PT Re-Evaluation 03/02/24    PT Start Time 1015    PT Stop Time 1055    PT Time Calculation (min) 40 min    Activity Tolerance Patient tolerated treatment well    Behavior During Therapy [facility] for tasks assessed/performed                 Past Medical History:  Diagnosis Date   Allergic rhinitis 05/07/2014   Arthritis    Diabetes mellitus without complication (HCC)    Dizziness 01/02/2016   GERD (gastroesophageal reflux disease)    Headache 03/13/2015   Hx of adenomatous colonic polyps 10/04/2023   Kidney lesion 07/22/2020   right, followed by urology   Left flank pain 01/02/2016   Low back pain 06/06/2015   Pain of left side of body 08/13/2015   Post herpetic neuralgia    Routine general medical examination at a health care facility 05/07/2014   Seizure disorder (HCC) 05/07/2014   Seizures (HCC)    Last seizure in the 70s.   Shingles    Stye 05/07/2014   Past Surgical History:  Procedure Laterality Date   COLONOSCOPY     KNEE SURGERY Left    left miniscus   TOTAL KNEE ARTHROPLASTY Left 02/28/2023   Procedure: TOTAL KNEE ARTHROPLASTY;  Surgeon: Jerri Kay HERO, MD;  Location: [facility] OR;  Service: Orthopedics;  Laterality: Left;   UPPER GASTROINTESTINAL ENDOSCOPY     uvala surgery     Patient Active Problem List   Diagnosis Date Noted   Hx of adenomatous colonic polyps 10/04/2023   Belching 08/26/2023   Status post total left knee replacement 02/28/2023   Renal mass, right; Bosniak 4 complex cyst; 1 cm 09/09/2022   Acute medial meniscus tear of left knee 02/04/2022   Primary osteoarthritis of left knee 02/04/2022   Aortic root dilatation (HCC) 12/18/2021   Slow transit constipation 12/08/2021   Left-sided chest  wall pain 12/08/2021   Multiple pulmonary nodules 10/06/2021   Hyperlipidemia associated with type 2 diabetes mellitus (HCC) 08/19/2021   [name] 04/07/2021   Complex renal cyst 04/07/2021   Varicose veins of both lower extremities with pain 02/02/2019   Screening for colon cancer 09/19/2018   Type 2 diabetes mellitus with diabetic polyneuropathy, without long-term current use of insulin  (HCC) 03/29/2018   Diabetic neuropathy (HCC) 03/29/2018   Chronic pain of left knee 03/23/2018   Coronary artery disease involving native coronary artery of native heart with angina pectoris (HCC) 08/03/2017   Post herpetic neuralgia 07/29/2016   Dizziness 01/02/2016   Chronic midline low back pain without sciatica 06/06/2015   Nonintractable episodic headache 03/13/2015   GERD (gastroesophageal reflux disease) 11/15/2014   Seizure disorder (HCC) 05/07/2014   Allergic rhinitis 05/07/2014    PCP: Katheen Roselie [place]ford, NP   REFERRING PROVIDER: Jerri Kay HERO, MD   REFERRING DIAG:  Diagnosis  M79.604 (ICD-10-CM) - Pain in right leg  M54.16 (ICD-10-CM) - Radiculopathy, lumbar region    THERAPY DIAG:  Other low back pain  Difficulty in walking, not elsewhere classified  Muscle weakness (generalized)  Pain in right leg  Rationale for Evaluation and Treatment: Rehabilitation  ONSET DATE: 7-8 months  SUBJECTIVE:   SUBJECTIVE STATEMENT: Pt states LBP better than last visit.  Less restriction with movement.  PERTINENT HISTORY: DM, seizure disorder, arthritis, see PMH above  PAIN:  NPRS scale: 3/10 Pain location: low back Pain description: achy, concentrated in low back mostly Aggravating factors: leaning and moving in certain positions Relieving factors: changing positions  PRECAUTIONS: None  WEIGHT BEARING RESTRICTIONS: No  FALLS:  Has patient fallen in last 6 months? No  LIVING ENVIRONMENT: Lives with: lives with their family and  lives with their spouse Lives in: House/apartment Stairs: Yes: External: 15 steps; bilateral but cannot reach both Has following equipment at home: None  OCCUPATION: retired  PLOF: Independent  PATIENT GOALS: dance without pain   OBJECTIVE:   DIAGNOSTIC FINDINGS: Result Narrative 11/29/23  X-rays of the lumbar spine show degenerative changes.  No acute abnormalities.  PATIENT SURVEYS:  Patient-Specific Activity Scoring Scheme  0 represents "unable to perform." 10 represents "able to perform at prior level. 0 1 2 3 4 5 6 7 8 9  10 (Date and Score)   Activity Eval  12/19/23     1. dancing  6    2. Lifting items from floor (watermelon)  4    3. Stairs navigation 5   4.    5.    Score 5    Total score = sum of the activity scores/number of activities Minimum detectable change (90%CI) for average score = 2 points Minimum detectable change (90%CI) for single activity score = 3 points  COGNITION: 12/19/2023 Overall cognitive status: WFL    SENSATION: 12/19/2023 Numbness and tingling down Rt LE into lateral thigh to knee  POSTURE:  12/19/2023 rounded shoulders and forward head    LOWER EXTREMITY ROM:   ROM Right Eval 12/19/23  Left eval  Hip flexion 115 115  Hip extension    Hip abduction    Hip adduction    Hip internal rotation    Hip external rotation    Knee flexion    Knee extension    Ankle dorsiflexion    Ankle plantarflexion    Ankle inversion    Ankle eversion     (Blank rows = not tested)   LUMBAR ROM:   ROM AROM  Eval 12/19/2023 AROM 01/09/2024  Flexion 70 c pain in low back   Extension 10 c pain 25% WFL with end range pain  X5 in standing improved to 50% with reduce complaints.   Right lateral flexion 22 c pain   Left lateral flexion 25   Right rotation WFL   Left rotation WFL    (Blank rows = not tested)    LOWER EXTREMITY MMT:  MMT Right Eval 12/19/23 Left Eval 12/19/2023  Hip flexion 40.3 44.5  Hip extension    Hip  abduction    Hip adduction    Hip internal rotation    Hip external rotation    Knee flexion 63.8 65.4  Knee extension 53.1 60.2  Ankle dorsiflexion    Ankle plantarflexion    Ankle inversion    Ankle eversion     (Blank rows = not tested)  SPECIAL TESTS:  12/19/23 Slump test: positive on Rt   FUNCTIONAL TESTS:  01/09/2024: 5x sit to stand no UE: 12 seconds   12/19/23: 5 time sit to stand: 13.75 seconds no UE support  GAIT: 12/19/2023 Distance walked: clinic distances Assistive device utilized: None Level of assistance: Complete Independence Comments: mild antalgic gait pattern with wide BOS  TODAY'S TREATMENT                                                                           DATE: 01/19/24 THERE EX : Rec bike 8 min level 5  Doorway QL stretch Cat/cow: x 10 from raised mat table (lumbar/thoracic ROM) Prone: opposite arm/leg  2 x 10 (core strengthening, shoulder ROM, hip extension) Open Book 10x each side  TherActivites:  Prone: opposite arm/leg  2 x 10 (core strengthening, shoulder ROM, hip extension) Sit to stand: 12# kettle bell 2 x 10   sit to stand with 10 # KB  1 set without taps, 1 set with tap onto 8 inch step for LE strength , posture and balance Standing lumbar extension AROM 2 x 10    HEP addition Access Code: [email or web address] URL: [email or web address] Date: 01/19/2024 Prepared by: Burnard Meth  Exercises - Sidelying Open Book Thoracic Lumbar Rotation and Extension  - 2 x daily - 7 x weekly - 2 sets - 10 reps - 2 hold  DATE:  01/17/24   TherEx:  Cat/cow: x 10 from raised mat table (lumbar/thoracic ROM) Prone: knees bent, trunk rotation x 4 bil holding 20 sec TherActivites:  Prone: opposite arm/leg  2 x 10 (core strengthening, shoulder ROM, hip extension) Prone: knee bent:  reverse clams green TB 2 x 10 (hip mobility, hip strengthening) Sit to stand: 12# kettle bell 2 x 10  Leg Press: 100# 3 x 10 bil LE's (for improvements in functional squats) Manual:  Percussion and STM to pt's bil lumbar paraspinals, QL with concentration on Rt side    TODAY'S TREATMENT                                                                          DATE:  01/12/24   Therex: Prone hip ext 10x each leg Supine hooklying trunk rotation 15 sec x 3 bilateral  Supine hooklying bridge 2-3 sec hold 3 x 10 with Green Tband Supine hooklying tband blue clam shell with contralateral leg hold x 20 bilateral  Open Book 10x10 sec each way Neuro Re Ed 5x3 sit to stand with tap onto 8 inch step for LE strength and balance Standing lumbar extension AROM 2 x 5    01/09/2024 Therex: Standing lumbar extension AROM 2 x 5  Lt sidelying regional rotation lumbar stretch 15 sec x 5 Supine hooklying trunk rotation 15 sec x 3 bilateral  Supine hooklying bridge 2-3 sec hold 2 x 10  Supine hooklying tband blue clam shell with contralateral leg hold x 20 bilateral  Supine hooklying pball press down UE 10 second hold x 10  5x sit to stand for LE strength.   HEP review with updates given.    Manual: Percussive device STM to Rt lumbar paraspinals, QL in Lt sidelying.    TODAY'S TREATMENT  DATE: 01/05/24   Rec bike level 5  8 minutes Door way QL stretch x 2 bil sides x 15 sec holds Standing trunk extension x 10 holding 5 sec Standing hip extension 2 x 10 bil LE alternating Supine bridges: 2 x 10 holding 5 sec Supine Tband hip abduction 3x10 Green Supine trunk rotation x 4 holding 30 sec bil  Supine marching with TA activation 2x10 Percussion device to lumbar paraspinals and Rt piriformis, glutes and Rt IT band       TODAY'S TREATMENT                                                                           DATE  01/03/24 Rec bike level 5  8 minutes Door way QL stretch x 2 bil sides x 15 sec holds Standing trunk extension x 10 holding 5 sec Prone hip extension 2 x 10 bil LE alternating Supine bridges: 2 x 10 holding 5 sec Supine Tband hip abduction 3x10 Green Supine trunk rotation x 4 holding 30 sec bil  Percussion device to lumbar paraspinals and Rt piriformis, glutes and Rt IT band    TODAY'S TREATMENT                                                                          DATE:12/27/23 Therex: Nustep: 8 minutes, Level 6 UE/LE Supine bridges: 2 x 10 holding 5 sec Supine trunk rotation x 4 holding 30 sec bil  Supine modified 100's x 10 holding 10 sec Prone hip extension 2 x 10 bil LE alternating Door way QL stretch x 2 bil sides x 15 sec holds Standing trunk extension x 10 holding 5 sec Manual Lumbar grade 2-3 PA mobs L2-L5 Percussion device to lumbar paraspinals and Rt piriformis, glutes and Rt IT band  Self Care:  Self STM using tennis ball       PATIENT EDUCATION:  01/09/2024 Education details: HEP update Person educated: Patient Education method: Programmer, multimedia, Demonstration, Verbal cues, and Handouts Education comprehension: verbalized understanding, returned demonstration, and verbal cues required  HOME EXERCISE PROGRAM: Access Code: [email or web address] URL: [email or web address] Date: 01/09/2024 Prepared by: Ozell Silvan  Exercises - Standing Quadratus Lumborum Stretch with Doorway  - 1 x daily - 7 x weekly - 5 reps - 10 seconds hold - Standing Hip Hiking  - 2 x daily - 7 x weekly - 2 sets - 10 reps - Hooklying Single Knee to Chest Stretch  - 2 x daily - 7 x weekly - 3 reps - 20 seconds hold - Supine Lower Trunk Rotation  - 1-2 x daily - 7 x weekly - 3 reps - 20 seconds hold - Sidelying Lumbar Rotation Stretch  - 1-2 x daily - 7 x weekly - 1 sets - 3-5 reps - 15 hold - Supine Bridge  - 1-2 x daily - 7 x weekly - 1-2 sets - 10 reps - 2 hold - Standing Lumbar  Extension  -  2-4 x daily - 7 x weekly - 1 sets - 5-10 reps  ASSESSMENT:  CLINICAL IMPRESSION: Pt needed VC for open book stretch.  Demonstrated understanding.  HEP picture given. OBJECTIVE IMPAIRMENTS: Abnormal gait, difficulty walking, decreased ROM, decreased strength, impaired flexibility, postural dysfunction, and pain.   ACTIVITY LIMITATIONS: bending, sitting, squatting, sleeping, and dancing  PARTICIPATION LIMITATIONS: driving and community activity  PERSONAL FACTORS: see PMH and pertinent history above are also affecting patient's functional outcome.   REHAB POTENTIAL: Good  CLINICAL DECISION MAKING: Stable/uncomplicated  EVALUATION COMPLEXITY: Low   GOALS: Goals reviewed with patient? Yes  SHORT TERM GOALS: (target date for Short term goals are 3 weeks 01/09/2024)   1.  Patient will demonstrate independent use of home exercise program to maintain progress from in clinic treatments. Goal status: Met for initial HEP. 12/27/23  2. Pt will improve his 5 time sit to stand to </= 10 seconds with no UE support.   Goal status: partially met 01/09/2024  LONG TERM GOALS: (target dates for all long term goals are 10 weeks 03/02/2024 )   1. Patient will demonstrate/report pain at worst less than or equal to 2/10 to facilitate minimal limitation in daily activity secondary to pain symptoms. Goal status: New   2. Patient will demonstrate independent use of home exercise program to facilitate ability to maintain/progress functional gains from skilled physical therapy services.  Goal status: New   3. Patient will demonstrate Patient specific functional scale avg > or = 7 to indicate reduced disability due to condition.  Goal status: New   4.  Patient will demonstrate Rt LE quad strength will increase by >/= 5 pounds throughout to faciltiate usual transfers, stairs, squatting at PLOF for daily life.  Goal status: New   5.  Patient will demonstrate up and down 1 flight of stairs with  single hand rail with pain </= 2/10.  Goal status: New   6.  Pt will reporting return to dancing with pain </= 2/10.  Goal status: New    PLAN:  PT FREQUENCY: 1-2x/week  PT DURATION: 10 weeks  PLANNED INTERVENTIONS: Can include 02853- PT Re-evaluation, 97110-Therapeutic exercises, 97530- Therapeutic activity, [id number]- Neuromuscular re-education, 97535- Self Care, 97140- Manual therapy, [phone]- Gait training, [phone]- Orthotic Fit/training, [phone]- Canalith repositioning, [id number]- Aquatic Therapy, [phone]- Electrical stimulation (unattended), [id number] Physical performance testing, 97016- Vasopneumatic device, [id number]- Ultrasound, [id number]- Traction (mechanical), [id number]- Ionotophoresis 4mg /ml Dexamethasone ,  79439 - Needle insertion w/o injection 1 or 2 muscles, 20561 - Needle insertion w/o injection 3 or more muscles.   Patient/Family education, Balance training, Stair training, Taping, Dry Needling, Joint mobilization, Joint manipulation, Spinal manipulation, Spinal mobilization, Scar mobilization, Vestibular training, Visual/preceptual remediation/compensation, DME instructions, Cryotherapy, and Moist heat.  All performed as medically necessary.  All included unless contraindicated  PLAN FOR NEXT SESSION: Lumbar mobility gains.    Burnard Meth, PT 01/19/24  4:14 PM      Date of referral: 11/29/23 Referring provider: Jerri Kay HERO, MD  Referring diagnosis?  M79.604 (ICD-10-CM) - Pain in right leg  M54.16 (ICD-10-CM) - Radiculopathy, lumbar region   Treatment diagnosis? (if different than referring diagnosis) M54.59, r26.2, m62.81, m79.604  What was this (referring dx) caused by? Arthritis  Nature of Condition: Initial Onset (within last 3 months)   Laterality: Rt  Current Functional Measure Score: Patient Specific Functional Scale 5  Objective measurements identify impairments when they are compared to normal values, the uninvolved extremity, and prior level of function.  [x]  Yes  []   No  Objective assessment of functional ability: Minimal functional limitations   Briefly describe symptoms: pain, weakness and radiation down Rt LE  How did symptoms start: over time  Average pain intensity:  Last 24 hours: 6/10  Past week: 7/10  How often does the pt experience symptoms? Frequently  How much have the symptoms interfered with usual daily activities? Moderately  How has condition changed since care began at this facility? No change first treatment visit  In general, how is the patients overall health? Very Good   BACK PAIN (STarT Back Screening Tool) Has pain spread down the leg(s) at some time in the last 2 weeks? yes Has there been pain in the shoulder or neck at some time in the last 2 weeks? no Has the pt only walked short distances because of back pain? yes Has patient dressed more slowly because of back pain in the past 2 weeks? yes Does patient think it's not safe for a person with this condition to be physically active? no Does patient have worrying thoughts a lot of the time? yes Does patient feel back pain is terrible and will never get any better? no Has patient stopped enjoying things they usually enjoy? some

## 2024-01-19 ENCOUNTER — Ambulatory Visit

## 2024-01-19 DIAGNOSIS — R262 Difficulty in walking, not elsewhere classified: Secondary | ICD-10-CM

## 2024-01-19 DIAGNOSIS — M79604 Pain in right leg: Secondary | ICD-10-CM

## 2024-01-19 DIAGNOSIS — M6281 Muscle weakness (generalized): Secondary | ICD-10-CM | POA: Diagnosis not present

## 2024-01-19 DIAGNOSIS — M5459 Other low back pain: Secondary | ICD-10-CM

## 2024-01-24 ENCOUNTER — Ambulatory Visit: Admitting: Physical Therapy

## 2024-01-24 ENCOUNTER — Encounter: Payer: Self-pay | Admitting: Physical Therapy

## 2024-01-24 DIAGNOSIS — M5459 Other low back pain: Secondary | ICD-10-CM | POA: Diagnosis not present

## 2024-01-24 DIAGNOSIS — M79604 Pain in right leg: Secondary | ICD-10-CM

## 2024-01-24 DIAGNOSIS — R262 Difficulty in walking, not elsewhere classified: Secondary | ICD-10-CM

## 2024-01-24 DIAGNOSIS — M6281 Muscle weakness (generalized): Secondary | ICD-10-CM

## 2024-01-24 NOTE — Therapy (Signed)
 OUTPATIENT PHYSICAL THERAPY TREATMENT   Patient Name: Nicholas Rasmussen MRN: [id number] DOB:1946-03-27, 78 y.o., male Today's Date: 01/24/2024  END OF SESSION:  PT End of Session - 01/24/24 1512     Visit Number 9    Number of Visits 20    Progress Note Due on Visit 10    PT Start Time 1430    PT Stop Time 1512    PT Time Calculation (min) 42 min    Activity Tolerance Patient tolerated treatment well    Behavior During Therapy [facility] for tasks assessed/performed                  Past Medical History:  Diagnosis Date   Allergic rhinitis 05/07/2014   Arthritis    Diabetes mellitus without complication (HCC)    Dizziness 01/02/2016   GERD (gastroesophageal reflux disease)    Headache 03/13/2015   Hx of adenomatous colonic polyps 10/04/2023   Kidney lesion 07/22/2020   right, followed by urology   Left flank pain 01/02/2016   Low back pain 06/06/2015   Pain of left side of body 08/13/2015   Post herpetic neuralgia    Routine general medical examination at a health care facility 05/07/2014   Seizure disorder (HCC) 05/07/2014   Seizures (HCC)    Last seizure in the 70s.   Shingles    Stye 05/07/2014   Past Surgical History:  Procedure Laterality Date   COLONOSCOPY     KNEE SURGERY Left    left miniscus   TOTAL KNEE ARTHROPLASTY Left 02/28/2023   Procedure: TOTAL KNEE ARTHROPLASTY;  Surgeon: Jerri Kay HERO, MD;  Location: [facility] OR;  Service: Orthopedics;  Laterality: Left;   UPPER GASTROINTESTINAL ENDOSCOPY     uvala surgery     Patient Active Problem List   Diagnosis Date Noted   Hx of adenomatous colonic polyps 10/04/2023   Belching 08/26/2023   Status post total left knee replacement 02/28/2023   Renal mass, right; Bosniak 4 complex cyst; 1 cm 09/09/2022   Acute medial meniscus tear of left knee 02/04/2022   Primary osteoarthritis of left knee 02/04/2022   Aortic root dilatation (HCC) 12/18/2021   Slow transit constipation 12/08/2021   Left-sided chest wall  pain 12/08/2021   Multiple pulmonary nodules 10/06/2021   Hyperlipidemia associated with type 2 diabetes mellitus (HCC) 08/19/2021   [name] 04/07/2021   Complex renal cyst 04/07/2021   Varicose veins of both lower extremities with pain 02/02/2019   Screening for colon cancer 09/19/2018   Type 2 diabetes mellitus with diabetic polyneuropathy, without long-term current use of insulin  (HCC) 03/29/2018   Diabetic neuropathy (HCC) 03/29/2018   Chronic pain of left knee 03/23/2018   Coronary artery disease involving native coronary artery of native heart with angina pectoris (HCC) 08/03/2017   Post herpetic neuralgia 07/29/2016   Dizziness 01/02/2016   Chronic midline low back pain without sciatica 06/06/2015   Nonintractable episodic headache 03/13/2015   GERD (gastroesophageal reflux disease) 11/15/2014   Seizure disorder (HCC) 05/07/2014   Allergic rhinitis 05/07/2014    PCP: Katheen Roselie Rockford, NP   REFERRING PROVIDER: Jerri Kay HERO, MD   REFERRING DIAG:  Diagnosis  M79.604 (ICD-10-CM) - Pain in right leg  M54.16 (ICD-10-CM) - Radiculopathy, lumbar region    THERAPY DIAG:  Other low back pain  Difficulty in walking, not elsewhere classified  Muscle weakness (generalized)  Pain in right leg  Rationale for Evaluation and Treatment: Rehabilitation  ONSET DATE:  7-8 months  SUBJECTIVE:   SUBJECTIVE STATEMENT: Pt reporting pain in his Rt side low back today of 1-2/10. Pt stating some days are better than others.   PERTINENT HISTORY: DM, seizure disorder, arthritis, see PMH above  PAIN:  NPRS scale: 1-2/10 Pain location: low back Pain description: achy, concentrated in low back mostly Aggravating factors: leaning and moving in certain positions Relieving factors: changing positions  PRECAUTIONS: None  WEIGHT BEARING RESTRICTIONS: No  FALLS:  Has patient fallen in last 6 months? No  LIVING ENVIRONMENT: Lives with:  lives with their family and lives with their spouse Lives in: House/apartment Stairs: Yes: External: 15 steps; bilateral but cannot reach both Has following equipment at home: None  OCCUPATION: retired  PLOF: Independent  PATIENT GOALS: dance without pain   OBJECTIVE:   DIAGNOSTIC FINDINGS: Result Narrative 11/29/23  X-rays of the lumbar spine show degenerative changes.  No acute abnormalities.  PATIENT SURVEYS:  Patient-Specific Activity Scoring Scheme  0 represents "unable to perform." 10 represents "able to perform at prior level. 0 1 2 3 4 5 6 7 8 9  10 (Date and Score)   Activity Eval  12/19/23     1. dancing  6    2. Lifting items from floor (watermelon)  4    3. Stairs navigation 5   4.    5.    Score 5    Total score = sum of the activity scores/number of activities Minimum detectable change (90%CI) for average score = 2 points Minimum detectable change (90%CI) for single activity score = 3 points  COGNITION: 12/19/2023 Overall cognitive status: WFL    SENSATION: 12/19/2023 Numbness and tingling down Rt LE into lateral thigh to knee  POSTURE:  12/19/2023 rounded shoulders and forward head    LOWER EXTREMITY ROM:   ROM Right Eval 12/19/23  Left eval  Hip flexion 115 115  Hip extension    Hip abduction    Hip adduction    Hip internal rotation    Hip external rotation    Knee flexion    Knee extension    Ankle dorsiflexion    Ankle plantarflexion    Ankle inversion    Ankle eversion     (Blank rows = not tested)   LUMBAR ROM:   ROM AROM  Eval 12/19/2023 AROM 01/09/2024  Flexion 70 c pain in low back   Extension 10 c pain 25% WFL with end range pain  X5 in standing improved to 50% with reduce complaints.   Right lateral flexion 22 c pain   Left lateral flexion 25   Right rotation WFL   Left rotation WFL    (Blank rows = not tested)    LOWER EXTREMITY MMT:  MMT Right Eval 12/19/23 Left Eval 12/19/2023  Hip flexion 40.3  44.5  Hip extension    Hip abduction    Hip adduction    Hip internal rotation    Hip external rotation    Knee flexion 63.8 65.4  Knee extension 53.1 60.2  Ankle dorsiflexion    Ankle plantarflexion    Ankle inversion    Ankle eversion     (Blank rows = not tested)  SPECIAL TESTS:  12/19/23 Slump test: positive on Rt   FUNCTIONAL TESTS:  01/09/2024: 5x sit to stand no UE: 12 seconds   12/19/23: 5 time sit to stand: 13.75 seconds no UE support  GAIT: 12/19/2023 Distance walked: clinic distances Assistive device utilized: None Level of assistance: Complete Independence Comments:  mild antalgic gait pattern with wide BOS                                                                                                                                                                        TODAY'S TREATMENT                                                                          DATE:01/24/24 Therex: Reviwed pt's entire HEP TherActivites: (for improvements in functional mobility, endurance, bending, lifting, pushing, pulling, for house hold activities and yard work) Recumbent bike: 5 minutes, level 5  Double Leg Press: 112# 2 x 15 Single leg press: 56# x 15 bil LE Walking with 20# kettle bell in his left UE for 100 feet  Sit to stand c 12 # kettle bell in bil UE's x 10  Manual Lumbar grade 2-3 PA mobs L2-L5 STM to pt's Rt QL and piriformis, glute medius   TODAY'S TREATMENT                                                                           DATE: 01/19/24 THERE EX : Rec bike 8 min level 5  Doorway QL stretch Cat/cow: x 10 from raised mat table (lumbar/thoracic ROM) Prone: opposite arm/leg  2 x 10 (core strengthening, shoulder ROM, hip extension) Open Book 10x each side  TherActivites:  Prone: opposite arm/leg  2 x 10 (core strengthening, shoulder ROM, hip extension) Sit to stand: 12# kettle bell 2 x 10   sit to stand with 10 # KB  1 set without taps, 1 set with tap  onto 8 inch step for LE strength , posture and balance Standing lumbar extension AROM 2 x 10    HEP addition Access Code: [email or web address] URL: [email or web address] Date: 01/19/2024 Prepared by: Burnard Meth  Exercises - Sidelying Open Book Thoracic Lumbar Rotation and Extension  - 2 x daily - 7 x weekly - 2 sets - 10 reps - 2 hold  DATE:  01/17/24   TherEx:  Cat/cow: x 10 from raised mat table (lumbar/thoracic ROM) Prone: knees bent, trunk rotation x 4 bil holding 20 sec TherActivites:  Prone: opposite arm/leg  2  x 10 (core strengthening, shoulder ROM, hip extension) Prone: knee bent: reverse clams green TB 2 x 10 (hip mobility, hip strengthening) Sit to stand: 12# kettle bell 2 x 10  Leg Press: 100# 3 x 10 bil LE's (for improvements in functional squats) Manual:  Percussion and STM to pt's bil lumbar paraspinals, QL with concentration on Rt side    TODAY'S TREATMENT                                                                          DATE:  01/12/24   Therex: Prone hip ext 10x each leg Supine hooklying trunk rotation 15 sec x 3 bilateral  Supine hooklying bridge 2-3 sec hold 3 x 10 with Green Tband Supine hooklying tband blue clam shell with contralateral leg hold x 20 bilateral  Open Book 10x10 sec each way Neuro Re Ed 5x3 sit to stand with tap onto 8 inch step for LE strength and balance Standing lumbar extension AROM 2 x 5    01/09/2024 Therex: Standing lumbar extension AROM 2 x 5  Lt sidelying regional rotation lumbar stretch 15 sec x 5 Supine hooklying trunk rotation 15 sec x 3 bilateral  Supine hooklying bridge 2-3 sec hold 2 x 10  Supine hooklying tband blue clam shell with contralateral leg hold x 20 bilateral  Supine hooklying pball press down UE 10 second hold x 10  5x sit to stand for LE strength.   HEP review with updates given.    Manual: Percussive device STM to Rt lumbar paraspinals, QL in Lt sidelying.       PATIENT EDUCATION:   01/09/2024 Education details: HEP update Person educated: Patient Education method: Programmer, multimedia, Demonstration, Verbal cues, and Handouts Education comprehension: verbalized understanding, returned demonstration, and verbal cues required  HOME EXERCISE PROGRAM: Access Code: [email or web address] URL: [email or web address] Date: 01/24/2024 Prepared by: Delon Lunger  Exercises - Standing Quadratus Lumborum Stretch with Doorway  - 1 x daily - 7 x weekly - 5 reps - 10 seconds hold - Standing Hip Hiking  - 2 x daily - 7 x weekly - 2 sets - 10 reps - Hooklying Single Knee to Chest Stretch  - 2 x daily - 7 x weekly - 3 reps - 20 seconds hold - Supine Lower Trunk Rotation  - 1-2 x daily - 7 x weekly - 3 reps - 20 seconds hold - Sidelying Lumbar Rotation Stretch  - 1-2 x daily - 7 x weekly - 1 sets - 3-5 reps - 15 hold - Supine Bridge  - 1-2 x daily - 7 x weekly - 1-2 sets - 10 reps - 2 hold - Standing Lumbar Extension  - 2-4 x daily - 7 x weekly - 1 sets - 5-10 reps - Sidelying Thoracic Rotation with Open Book  - 2 x daily - 7 x weekly - 10 reps - 2 seconds hold - Prone Alternating Arm and Leg Lifts  - 1 x daily - 7 x weekly - 20 reps - Prone Press Up On Elbows  - 1 x daily - 7 x weekly - 3 reps - 60 sec hold  ASSESSMENT:  CLINICAL IMPRESSION: Pt tolerating exercises well still reporting pin point pain over his Rt QL.  Pt with good response to manual mobs. Pt's HEP was updated and stored under one access code. Recommending continued skilled PT interventions.     OBJECTIVE IMPAIRMENTS: Abnormal gait, difficulty walking, decreased ROM, decreased strength, impaired flexibility, postural dysfunction, and pain.   ACTIVITY LIMITATIONS: bending, sitting, squatting, sleeping, and dancing  PARTICIPATION LIMITATIONS: driving and community activity  PERSONAL FACTORS: see PMH and pertinent history above are also affecting patient's functional outcome.   REHAB POTENTIAL: Good  CLINICAL  DECISION MAKING: Stable/uncomplicated  EVALUATION COMPLEXITY: Low   GOALS: Goals reviewed with patient? Yes  SHORT TERM GOALS: (target date for Short term goals are 3 weeks 01/09/2024)   1.  Patient will demonstrate independent use of home exercise program to maintain progress from in clinic treatments. Goal status: Met for initial HEP. 12/27/23  2. Pt will improve his 5 time sit to stand to </= 10 seconds with no UE support.   Goal status: partially met 01/09/2024  LONG TERM GOALS: (target dates for all long term goals are 10 weeks 03/02/2024 )   1. Patient will demonstrate/report pain at worst less than or equal to 2/10 to facilitate minimal limitation in daily activity secondary to pain symptoms. Goal status: New   2. Patient will demonstrate independent use of home exercise program to facilitate ability to maintain/progress functional gains from skilled physical therapy services.  Goal status: New   3. Patient will demonstrate Patient specific functional scale avg > or = 7 to indicate reduced disability due to condition.  Goal status: New   4.  Patient will demonstrate Rt LE quad strength will increase by >/= 5 pounds throughout to faciltiate usual transfers, stairs, squatting at PLOF for daily life.  Goal status: New   5.  Patient will demonstrate up and down 1 flight of stairs with single hand rail with pain </= 2/10.  Goal status: New   6.  Pt will reporting return to dancing with pain </= 2/10.  Goal status: New    PLAN:  PT FREQUENCY: 1-2x/week  PT DURATION: 10 weeks  PLANNED INTERVENTIONS: Can include 02853- PT Re-evaluation, 97110-Therapeutic exercises, 97530- Therapeutic activity, [id number]- Neuromuscular re-education, 97535- Self Care, 97140- Manual therapy, [phone]- Gait training, [phone]- Orthotic Fit/training, [phone]- Canalith repositioning, [id number]- Aquatic Therapy, [phone]- Electrical stimulation (unattended), [id number] Physical performance testing, 97016- Vasopneumatic  device, [id number]- Ultrasound, [id number]- Traction (mechanical), [id number]- Ionotophoresis 4mg /ml Dexamethasone ,  [id number] - Needle insertion w/o injection 1 or 2 muscles, 20561 - Needle insertion w/o injection 3 or more muscles.   Patient/Family education, Balance training, Stair training, Taping, Dry Needling, Joint mobilization, Joint manipulation, Spinal manipulation, Spinal mobilization, Scar mobilization, Vestibular training, Visual/preceptual remediation/compensation, DME instructions, Cryotherapy, and Moist heat.  All performed as medically necessary.  All included unless contraindicated  PLAN FOR NEXT SESSION: Lumbar mobility gains, core strengthening   Delon Lunger, PT, MPT 01/24/24 3:18 PM   01/24/24  3:18 PM      Date of referral: 11/29/23 Referring provider: Jerri Kay HERO, MD  Referring diagnosis?  M79.604 (ICD-10-CM) - Pain in right leg  M54.16 (ICD-10-CM) - Radiculopathy, lumbar region   Treatment diagnosis? (if different than referring diagnosis) M54.59, r26.2, m62.81, m79.604  What was this (referring dx) caused by? Arthritis  Nature of Condition: Initial Onset (within last 3 months)   Laterality: Rt  Current Functional Measure Score: Patient Specific Functional Scale 5  Objective measurements identify impairments when they are compared to normal values, the uninvolved extremity, and prior level of function.  [x]  Yes  []   No  Objective assessment of functional ability: Minimal functional limitations   Briefly describe symptoms: pain, weakness and radiation down Rt LE  How did symptoms start: over time  Average pain intensity:  Last 24 hours: 6/10  Past week: 7/10  How often does the pt experience symptoms? Frequently  How much have the symptoms interfered with usual daily activities? Moderately  How has condition changed since care began at this facility? No change first treatment visit  In general, how is the patients overall health? Very Good   BACK PAIN  (STarT Back Screening Tool) Has pain spread down the leg(s) at some time in the last 2 weeks? yes Has there been pain in the shoulder or neck at some time in the last 2 weeks? no Has the pt only walked short distances because of back pain? yes Has patient dressed more slowly because of back pain in the past 2 weeks? yes Does patient think it's not safe for a person with this condition to be physically active? no Does patient have worrying thoughts a lot of the time? yes Does patient feel back pain is terrible and will never get any better? no Has patient stopped enjoying things they usually enjoy? some

## 2024-01-26 ENCOUNTER — Ambulatory Visit

## 2024-01-26 DIAGNOSIS — R262 Difficulty in walking, not elsewhere classified: Secondary | ICD-10-CM | POA: Diagnosis not present

## 2024-01-26 DIAGNOSIS — M6281 Muscle weakness (generalized): Secondary | ICD-10-CM

## 2024-01-26 DIAGNOSIS — M5459 Other low back pain: Secondary | ICD-10-CM

## 2024-01-26 DIAGNOSIS — M79604 Pain in right leg: Secondary | ICD-10-CM

## 2024-01-26 NOTE — Therapy (Signed)
 OUTPATIENT PHYSICAL THERAPY TREATMENT   Patient Name: Nicholas Rasmussen MRN: [id number] DOB:14-Dec-1945, 78 y.o., male Today's Date: 01/26/2024  END OF SESSION:  PT End of Session - 01/26/24 1434     Visit Number 10    Number of Visits 20    Date for PT Re-Evaluation 03/02/24    Progress Note Due on Visit 10    PT Start Time 1346    PT Stop Time 1424    PT Time Calculation (min) 38 min    Activity Tolerance Patient tolerated treatment well    Behavior During Therapy [facility] for tasks assessed/performed                   Past Medical History:  Diagnosis Date   Allergic rhinitis 05/07/2014   Arthritis    Diabetes mellitus without complication (HCC)    Dizziness 01/02/2016   GERD (gastroesophageal reflux disease)    Headache 03/13/2015   Hx of adenomatous colonic polyps 10/04/2023   Kidney lesion 07/22/2020   right, followed by urology   Left flank pain 01/02/2016   Low back pain 06/06/2015   Pain of left side of body 08/13/2015   Post herpetic neuralgia    Routine general medical examination at a health care facility 05/07/2014   Seizure disorder (HCC) 05/07/2014   Seizures (HCC)    Last seizure in the 70s.   Shingles    Stye 05/07/2014   Past Surgical History:  Procedure Laterality Date   COLONOSCOPY     KNEE SURGERY Left    left miniscus   TOTAL KNEE ARTHROPLASTY Left 02/28/2023   Procedure: TOTAL KNEE ARTHROPLASTY;  Surgeon: Jerri Kay HERO, MD;  Location: [facility] OR;  Service: Orthopedics;  Laterality: Left;   UPPER GASTROINTESTINAL ENDOSCOPY     uvala surgery     Patient Active Problem List   Diagnosis Date Noted   Hx of adenomatous colonic polyps 10/04/2023   Belching 08/26/2023   Status post total left knee replacement 02/28/2023   Renal mass, right; Bosniak 4 complex cyst; 1 cm 09/09/2022   Acute medial meniscus tear of left knee 02/04/2022   Primary osteoarthritis of left knee 02/04/2022   Aortic root dilatation (HCC) 12/18/2021   Slow transit  constipation 12/08/2021   Left-sided chest wall pain 12/08/2021   Multiple pulmonary nodules 10/06/2021   Hyperlipidemia associated with type 2 diabetes mellitus (HCC) 08/19/2021   [name] 04/07/2021   Complex renal cyst 04/07/2021   Varicose veins of both lower extremities with pain 02/02/2019   Screening for colon cancer 09/19/2018   Type 2 diabetes mellitus with diabetic polyneuropathy, without long-term current use of insulin  (HCC) 03/29/2018   Diabetic neuropathy (HCC) 03/29/2018   Chronic pain of left knee 03/23/2018   Coronary artery disease involving native coronary artery of native heart with angina pectoris (HCC) 08/03/2017   Post herpetic neuralgia 07/29/2016   Dizziness 01/02/2016   Chronic midline low back pain without sciatica 06/06/2015   Nonintractable episodic headache 03/13/2015   GERD (gastroesophageal reflux disease) 11/15/2014   Seizure disorder (HCC) 05/07/2014   Allergic rhinitis 05/07/2014    PCP: Katheen Roselie Rockford, NP   REFERRING PROVIDER: Jerri Kay HERO, MD   REFERRING DIAG:  Diagnosis  M79.604 (ICD-10-CM) - Pain in right leg  M54.16 (ICD-10-CM) - Radiculopathy, lumbar region    THERAPY DIAG:  Difficulty in walking, not elsewhere classified  Other low back pain  Muscle weakness (generalized)  Pain in right leg  Rationale for Evaluation and Treatment: Rehabilitation  ONSET DATE: 7-8 months  SUBJECTIVE:   SUBJECTIVE STATEMENT: Pt states LBP is minimal.  Still affecting transfers as day progresses.  PERTINENT HISTORY: DM, seizure disorder, arthritis, see PMH above  PAIN:  PRS scale: 1/10 Pain location: low back Pain description: achy, concentrated in low back mostly Aggravating factors: leaning and moving in certain positions Relieving factors: changing positions  PRECAUTIONS: None  WEIGHT BEARING RESTRICTIONS: No  FALLS:  Has patient fallen in last 6 months? No  LIVING  ENVIRONMENT: Lives with: lives with their family and lives with their spouse Lives in: House/apartment Stairs: Yes: External: 15 steps; bilateral but cannot reach both Has following equipment at home: None  OCCUPATION: retired  PLOF: Independent  PATIENT GOALS: dance without pain   OBJECTIVE:   DIAGNOSTIC FINDINGS: Result Narrative 11/29/23  X-rays of the lumbar spine show degenerative changes.  No acute abnormalities.  PATIENT SURVEYS:  Patient-Specific Activity Scoring Scheme  0 represents "unable to perform." 10 represents "able to perform at prior level. 0 1 2 3 4 5 6 7 8 9  10 (Date and Score)   Activity Eval  12/19/23     1. dancing  6    2. Lifting items from floor (watermelon)  4    3. Stairs navigation 5   4.    5.    Score 5    Total score = sum of the activity scores/number of activities Minimum detectable change (90%CI) for average score = 2 points Minimum detectable change (90%CI) for single activity score = 3 points  COGNITION: 12/19/2023 Overall cognitive status: WFL    SENSATION: 12/19/2023 Numbness and tingling down Rt LE into lateral thigh to knee  POSTURE:  12/19/2023 rounded shoulders and forward head    LOWER EXTREMITY ROM:   ROM Right Eval 12/19/23  Left eval  Hip flexion 115 115  Hip extension    Hip abduction    Hip adduction    Hip internal rotation    Hip external rotation    Knee flexion    Knee extension    Ankle dorsiflexion    Ankle plantarflexion    Ankle inversion    Ankle eversion     (Blank rows = not tested)   LUMBAR ROM:   ROM AROM  Eval 12/19/2023 AROM 01/09/2024  Flexion 70 c pain in low back   Extension 10 c pain 25% WFL with end range pain  X5 in standing improved to 50% with reduce complaints.   Right lateral flexion 22 c pain   Left lateral flexion 25   Right rotation WFL   Left rotation WFL    (Blank rows = not tested)    LOWER EXTREMITY MMT:  MMT Right Eval 12/19/23  Left Eval 12/19/2023  Hip flexion 40.3 44.5  Hip extension    Hip abduction    Hip adduction    Hip internal rotation    Hip external rotation    Knee flexion 63.8 65.4  Knee extension 53.1 60.2  Ankle dorsiflexion    Ankle plantarflexion    Ankle inversion    Ankle eversion     (Blank rows = not tested)  SPECIAL TESTS:  12/19/23 Slump test: positive on Rt   FUNCTIONAL TESTS:  01/09/2024: 5x sit to stand no UE: 12 seconds   12/19/23: 5 time sit to stand: 13.75 seconds no UE support  GAIT: 12/19/2023 Distance walked: clinic distances Assistive device utilized: None Level of assistance: Complete Independence Comments:  mild antalgic gait pattern with wide BOS                                                                                                                                                                       TODAY'S TREATMENT                                                                          DATE:01/26/24 Rec Bike level 5 7 min Double Leg Press: 112# 3x 15 Single leg press: 62#  2x 15 bil LE Walking with 20# kettle bell in his left UE for 100 feet  Sit to stand c 12 # kettle bell in bil UE's x 10  Prone opp arm/opp leg 2x10 Prone on elbows 10x Open book 10x each way Manual Percussive device STM to Rt lumbar paraspinals, QL in Lt sidelying.       TODAY'S TREATMENT                                                                          DATE:01/24/24 Therex: Reviwed pt's entire HEP TherActivites: (for improvements in functional mobility, endurance, bending, lifting, pushing, pulling, for house hold activities and yard work) Recumbent bike: 5 minutes, level 5  Double Leg Press: 112# 2 x 15 Single leg press: 56# x 15 bil LE Walking with 20# kettle bell in his left UE for 100 feet  Sit to stand c 12 # kettle bell in bil UE's x 10  Manual Lumbar grade 2-3 PA mobs L2-L5 STM to pt's Rt QL and piriformis, glute medius   TODAY'S TREATMENT                                                                            DATE: 01/19/24 THERE EX : Rec bike 8 min level 5  Doorway QL stretch Cat/cow: x 10 from raised mat table (lumbar/thoracic ROM) Prone: opposite arm/leg  2 x 10 (core strengthening, shoulder ROM, hip extension) Open Book 10x each side  TherActivites:  Prone: opposite arm/leg  2 x 10 (core strengthening, shoulder ROM, hip extension) Sit to stand: 12# kettle bell 2 x 10   sit to stand with 10 # KB  1 set without taps, 1 set with tap onto 8 inch step for LE strength , posture and balance Standing lumbar extension AROM 2 x 10    HEP addition Access Code: [email or web address] URL: [email or web address] Date: 01/19/2024 Prepared by: Burnard Meth  Exercises - Sidelying Open Book Thoracic Lumbar Rotation and Extension  - 2 x daily - 7 x weekly - 2 sets - 10 reps - 2 hold  DATE:  01/17/24   TherEx:  Cat/cow: x 10 from raised mat table (lumbar/thoracic ROM) Prone: knees bent, trunk rotation x 4 bil holding 20 sec TherActivites:  Prone: opposite arm/leg  2 x 10 (core strengthening, shoulder ROM, hip extension) Prone: knee bent: reverse clams green TB 2 x 10 (hip mobility, hip strengthening) Sit to stand: 12# kettle bell 2 x 10  Leg Press: 100# 3 x 10 bil LE's (for improvements in functional squats) Manual:  Percussion and STM to pt's bil lumbar paraspinals, QL with concentration on Rt side    TODAY'S TREATMENT                                                                          DATE:  01/12/24   Therex: Prone hip ext 10x each leg Supine hooklying trunk rotation 15 sec x 3 bilateral  Supine hooklying bridge 2-3 sec hold 3 x 10 with Green Tband Supine hooklying tband blue clam shell with contralateral leg hold x 20 bilateral  Open Book 10x10 sec each way Neuro Re Ed 5x3 sit to stand with tap onto 8 inch step for LE strength and balance Standing lumbar extension AROM 2 x 5    01/09/2024 Therex: Standing lumbar  extension AROM 2 x 5  Lt sidelying regional rotation lumbar stretch 15 sec x 5 Supine hooklying trunk rotation 15 sec x 3 bilateral  Supine hooklying bridge 2-3 sec hold 2 x 10  Supine hooklying tband blue clam shell with contralateral leg hold x 20 bilateral  Supine hooklying pball press down UE 10 second hold x 10  5x sit to stand for LE strength.   HEP review with updates given.    Manual: Percussive device STM to Rt lumbar paraspinals, QL in Lt sidelying.       PATIENT EDUCATION:  01/09/2024 Education details: HEP update Person educated: Patient Education method: Programmer, multimedia, Demonstration, Verbal cues, and Handouts Education comprehension: verbalized understanding, returned demonstration, and verbal cues required  HOME EXERCISE PROGRAM: Access Code: [email or web address] URL: [email or web address] Date: 01/24/2024 Prepared by: Delon Lunger  Exercises - Standing Quadratus Lumborum Stretch with Doorway  - 1 x daily - 7 x weekly - 5 reps - 10 seconds hold - Standing Hip Hiking  - 2 x daily - 7 x weekly - 2 sets - 10 reps - Hooklying Single Knee to Chest Stretch  - 2 x daily - 7 x weekly - 3 reps - 20 seconds hold - Supine Lower Trunk Rotation  - 1-2 x daily -  7 x weekly - 3 reps - 20 seconds hold - Sidelying Lumbar Rotation Stretch  - 1-2 x daily - 7 x weekly - 1 sets - 3-5 reps - 15 hold - Supine Bridge  - 1-2 x daily - 7 x weekly - 1-2 sets - 10 reps - 2 hold - Standing Lumbar Extension  - 2-4 x daily - 7 x weekly - 1 sets - 5-10 reps - Sidelying Thoracic Rotation with Open Book  - 2 x daily - 7 x weekly - 10 reps - 2 seconds hold - Prone Alternating Arm and Leg Lifts  - 1 x daily - 7 x weekly - 20 reps - Prone Press Up On Elbows  - 1 x daily - 7 x weekly - 3 reps - 60 sec hold  ASSESSMENT:  CLINICAL IMPRESSION: Pt needed VC for prone exercises. Tactile cues to help with coordination of opp arm/leg.  Good form with walking with KB.  OBJECTIVE IMPAIRMENTS:  Abnormal gait, difficulty walking, decreased ROM, decreased strength, impaired flexibility, postural dysfunction, and pain.   ACTIVITY LIMITATIONS: bending, sitting, squatting, sleeping, and dancing  PARTICIPATION LIMITATIONS: driving and community activity  PERSONAL FACTORS: see PMH and pertinent history above are also affecting patient's functional outcome.   REHAB POTENTIAL: Good  CLINICAL DECISION MAKING: Stable/uncomplicated  EVALUATION COMPLEXITY: Low   GOALS: Goals reviewed with patient? Yes  SHORT TERM GOALS: (target date for Short term goals are 3 weeks 01/09/2024)   1.  Patient will demonstrate independent use of home exercise program to maintain progress from in clinic treatments. Goal status: Met for initial HEP. 12/27/23  2. Pt will improve his 5 time sit to stand to </= 10 seconds with no UE support.   Goal status: partially met 01/09/2024  LONG TERM GOALS: (target dates for all long term goals are 10 weeks 03/02/2024 )   1. Patient will demonstrate/report pain at worst less than or equal to 2/10 to facilitate minimal limitation in daily activity secondary to pain symptoms. Goal status: New   2. Patient will demonstrate independent use of home exercise program to facilitate ability to maintain/progress functional gains from skilled physical therapy services.  Goal status: New   3. Patient will demonstrate Patient specific functional scale avg > or = 7 to indicate reduced disability due to condition.  Goal status: New   4.  Patient will demonstrate Rt LE quad strength will increase by >/= 5 pounds throughout to faciltiate usual transfers, stairs, squatting at PLOF for daily life.  Goal status: New   5.  Patient will demonstrate up and down 1 flight of stairs with single hand rail with pain </= 2/10.  Goal status: New   6.  Pt will reporting return to dancing with pain </= 2/10.  Goal status: New    PLAN:  PT FREQUENCY: 1-2x/week  PT DURATION: 10  weeks  PLANNED INTERVENTIONS: Can include 02853- PT Re-evaluation, 97110-Therapeutic exercises, 97530- Therapeutic activity, [id number]- Neuromuscular re-education, 97535- Self Care, 97140- Manual therapy, [phone]- Gait training, [phone]- Orthotic Fit/training, [phone]- Canalith repositioning, [id number]- Aquatic Therapy, [phone]- Electrical stimulation (unattended), [id number] Physical performance testing, 97016- Vasopneumatic device, [id number]- Ultrasound, [id number]- Traction (mechanical), [id number]- Ionotophoresis 4mg /ml Dexamethasone ,  79439 - Needle insertion w/o injection 1 or 2 muscles, 20561 - Needle insertion w/o injection 3 or more muscles.   Patient/Family education, Balance training, Stair training, Taping, Dry Needling, Joint mobilization, Joint manipulation, Spinal manipulation, Spinal mobilization, Scar mobilization, Vestibular training, Visual/preceptual remediation/compensation, DME instructions, Cryotherapy, and Moist heat.  All performed as medically necessary.  All included unless contraindicated  PLAN FOR NEXT SESSION: core strengthening               Burnard Meth, PT 01/26/24  2:37 PM      Date of referral: 11/29/23 Referring provider: Jerri Kay HERO, MD  Referring diagnosis?  M79.604 (ICD-10-CM) - Pain in right leg  M54.16 (ICD-10-CM) - Radiculopathy, lumbar region   Treatment diagnosis? (if different than referring diagnosis) M54.59, r26.2, m62.81, m79.604  What was this (referring dx) caused by? Arthritis  Nature of Condition: Initial Onset (within last 3 months)   Laterality: Rt  Current Functional Measure Score: Patient Specific Functional Scale 5  Objective measurements identify impairments when they are compared to normal values, the uninvolved extremity, and prior level of function.  [x]  Yes  []  No  Objective assessment of functional ability: Minimal functional limitations   Briefly describe symptoms: pain, weakness and radiation down Rt LE  How did symptoms start: over  time  Average pain intensity:  Last 24 hours: 6/10  Past week: 7/10  How often does the pt experience symptoms? Frequently  How much have the symptoms interfered with usual daily activities? Moderately  How has condition changed since care began at this facility? No change first treatment visit  In general, how is the patients overall health? Very Good   BACK PAIN (STarT Back Screening Tool) Has pain spread down the leg(s) at some time in the last 2 weeks? yes Has there been pain in the shoulder or neck at some time in the last 2 weeks? no Has the pt only walked short distances because of back pain? yes Has patient dressed more slowly because of back pain in the past 2 weeks? yes Does patient think it's not safe for a person with this condition to be physically active? no Does patient have worrying thoughts a lot of the time? yes Does patient feel back pain is terrible and will never get any better? no Has patient stopped enjoying things they usually enjoy? some

## 2024-01-31 ENCOUNTER — Ambulatory Visit: Admitting: Nurse Practitioner

## 2024-02-08 NOTE — Therapy (Signed)
 OUTPATIENT PHYSICAL THERAPY TREATMENT   Patient Name: Nicholas Rasmussen MRN: [id number] DOB:[birth year], 77 y.o., male Today's Date: 02/09/2024  END OF SESSION:  PT End of Session - 02/09/24 1134     Visit Number 11    Number of Visits 20    Date for Recertification  03/02/24    Progress Note Due on Visit 10    PT Start Time 1100    PT Stop Time 1140    PT Time Calculation (min) 40 min    Activity Tolerance Patient tolerated treatment well    Behavior During Therapy [facility] for tasks assessed/performed                    Past Medical History:  Diagnosis Date   Allergic rhinitis 05/07/2014   Arthritis    Diabetes mellitus without complication (HCC)    Dizziness 01/02/2016   GERD (gastroesophageal reflux disease)    Headache 03/13/2015   Hx of adenomatous colonic polyps 10/04/2023   Kidney lesion 07/22/2020   right, followed by urology   Left flank pain 01/02/2016   Low back pain 06/06/2015   Pain of left side of body 08/13/2015   Post herpetic neuralgia    Routine general medical examination at a health care facility 05/07/2014   Seizure disorder (HCC) 05/07/2014   Seizures (HCC)    Last seizure in the 70s.   Shingles    Stye 05/07/2014   Past Surgical History:  Procedure Laterality Date   COLONOSCOPY     KNEE SURGERY Left    left miniscus   TOTAL KNEE ARTHROPLASTY Left 02/28/2023   Procedure: TOTAL KNEE ARTHROPLASTY;  Surgeon: Jerri Kay HERO, MD;  Location: [facility] OR;  Service: Orthopedics;  Laterality: Left;   UPPER GASTROINTESTINAL ENDOSCOPY     uvala surgery     Patient Active Problem List   Diagnosis Date Noted   Hx of adenomatous colonic polyps 10/04/2023   Belching 08/26/2023   Status post total left knee replacement 02/28/2023   Renal mass, right; Bosniak 4 complex cyst; 1 cm 09/09/2022   Acute medial meniscus tear of left knee 02/04/2022   Primary osteoarthritis of left knee 02/04/2022   Aortic root dilatation (HCC) 12/18/2021   Slow transit  constipation 12/08/2021   Left-sided chest wall pain 12/08/2021   Multiple pulmonary nodules 10/06/2021   Hyperlipidemia associated with type 2 diabetes mellitus (HCC) 08/19/2021   [name] 04/07/2021   Complex renal cyst 04/07/2021   Varicose veins of both lower extremities with pain 02/02/2019   Screening for colon cancer 09/19/2018   Type 2 diabetes mellitus with diabetic polyneuropathy, without long-term current use of insulin  (HCC) 03/29/2018   Diabetic neuropathy (HCC) 03/29/2018   Chronic pain of left knee 03/23/2018   Coronary artery disease involving native coronary artery of native heart with angina pectoris (HCC) 08/03/2017   Post herpetic neuralgia 07/29/2016   Dizziness 01/02/2016   Chronic midline low back pain without sciatica 06/06/2015   Nonintractable episodic headache 03/13/2015   GERD (gastroesophageal reflux disease) 11/15/2014   Seizure disorder (HCC) 05/07/2014   Allergic rhinitis 05/07/2014    PCP: Katheen Roselie Rockford, NP   REFERRING PROVIDER: Jerri Kay HERO, MD   REFERRING DIAG:  Diagnosis  M79.604 (ICD-10-CM) - Pain in right leg  M54.16 (ICD-10-CM) - Radiculopathy, lumbar region    THERAPY DIAG:  Difficulty in walking, not elsewhere classified  Other low back pain  Muscle weakness (generalized)  Pain in right leg  Rationale for Evaluation and Treatment: Rehabilitation  ONSET DATE: 7-8 months  SUBJECTIVE:   SUBJECTIVE STATEMENT: Pt states back pain has been better since last visit.  PERTINENT HISTORY: DM, seizure disorder, arthritis, see PMH above  PAIN:  PRS scale: 1/10 Pain location: low back Pain description: achy, concentrated in low back mostly Aggravating factors: leaning and moving in certain positions Relieving factors: changing positions  PRECAUTIONS: None  WEIGHT BEARING RESTRICTIONS: No  FALLS:  Has patient fallen in last 6 months? No  LIVING ENVIRONMENT: Lives with: lives  with their family and lives with their spouse Lives in: House/apartment Stairs: Yes: External: 15 steps; bilateral but cannot reach both Has following equipment at home: None  OCCUPATION: retired  PLOF: Independent  PATIENT GOALS: dance without pain   OBJECTIVE:   DIAGNOSTIC FINDINGS: Result Narrative 11/29/23  X-rays of the lumbar spine show degenerative changes.  No acute abnormalities.  PATIENT SURVEYS:  Patient-Specific Activity Scoring Scheme  0 represents "unable to perform." 10 represents "able to perform at prior level. 0 1 2 3 4 5 6 7 8 9  10 (Date and Score)   Activity Eval  12/19/23     1. dancing  6    2. Lifting items from floor (watermelon)  4    3. Stairs navigation 5   4.    5.    Score 5    Total score = sum of the activity scores/number of activities Minimum detectable change (90%CI) for average score = 2 points Minimum detectable change (90%CI) for single activity score = 3 points  COGNITION: 12/19/2023 Overall cognitive status: WFL    SENSATION: 12/19/2023 Numbness and tingling down Rt LE into lateral thigh to knee  POSTURE:  12/19/2023 rounded shoulders and forward head    LOWER EXTREMITY ROM:   ROM Right Eval 12/19/23  Left eval  Hip flexion 115 115  Hip extension    Hip abduction    Hip adduction    Hip internal rotation    Hip external rotation    Knee flexion    Knee extension    Ankle dorsiflexion    Ankle plantarflexion    Ankle inversion    Ankle eversion     (Blank rows = not tested)   LUMBAR ROM:   ROM AROM  Eval 12/19/2023 AROM 01/09/2024  Flexion 70 c pain in low back   Extension 10 c pain 25% WFL with end range pain  X5 in standing improved to 50% with reduce complaints.   Right lateral flexion 22 c pain   Left lateral flexion 25   Right rotation WFL   Left rotation WFL    (Blank rows = not tested)    LOWER EXTREMITY MMT:  MMT Right Eval 12/19/23 Left Eval 12/19/2023  Hip flexion 40.3 44.5   Hip extension    Hip abduction    Hip adduction    Hip internal rotation    Hip external rotation    Knee flexion 63.8 65.4  Knee extension 53.1 60.2  Ankle dorsiflexion    Ankle plantarflexion    Ankle inversion    Ankle eversion     (Blank rows = not tested)  SPECIAL TESTS:  12/19/23 Slump test: positive on Rt   FUNCTIONAL TESTS:  01/09/2024: 5x sit to stand no UE: 12 seconds   12/19/23: 5 time sit to stand: 13.75 seconds no UE support  GAIT: 12/19/2023 Distance walked: clinic distances Assistive device utilized: None Level of assistance: Complete Independence Comments: mild antalgic  gait pattern with wide BOS                                                                                                                                                                       TODAY'S TREATMENT                                                                           Date: 02/09/24 Rec Bike  level 2   Double Leg Press: 112# 3x 15 Single leg press: 62#  2x 15 bil LE Walking with 20# kettle bell in his left UE for 100 feet  Sit to stand c 12 # kettle bell in bil UE's 2x10 Prone opp arm/opp leg 2x10 Prone on elbows 10x Open book 10x each way Manual Percussive device STM to Rt lumbar paraspinals, QL in Lt sidelying.     DATE:01/26/24 Rec Bike level 5 7 min Double Leg Press: 112# 3x 15 Single leg press: 62#  2x 15 bil LE Walking with 20# kettle bell in his left UE for 100 feet  Sit to stand c 12 # kettle bell in bil UE's x 10  Prone opp arm/opp leg 2x10 Prone on elbows 10x Open book 10x each way Manual Percussive device STM to Rt lumbar paraspinals, QL in Lt sidelying.       TODAY'S TREATMENT                                                                          DATE:01/24/24 Therex: Reviwed pt's entire HEP TherActivites: (for improvements in functional mobility, endurance, bending, lifting, pushing, pulling, for house hold activities and yard work) Recumbent  bike: 5 minutes, level 5  Double Leg Press: 112# 2 x 15 Single leg press: 56# x 15 bil LE Walking with 20# kettle bell in his left UE for 100 feet  Sit to stand c 12 # kettle bell in bil UE's x 10  Manual Lumbar grade 2-3 PA mobs L2-L5 STM to pt's Rt QL and piriformis, glute medius   TODAY'S TREATMENT  DATE: 01/19/24 THERE EX : Rec bike 8 min level 5  Doorway QL stretch Cat/cow: x 10 from raised mat table (lumbar/thoracic ROM) Prone: opposite arm/leg  2 x 10 (core strengthening, shoulder ROM, hip extension) Open Book 10x each side  TherActivites:  Prone: opposite arm/leg  2 x 10 (core strengthening, shoulder ROM, hip extension) Sit to stand: 12# kettle bell 2 x 10   sit to stand with 10 # KB  1 set without taps, 1 set with tap onto 8 inch step for LE strength , posture and balance Standing lumbar extension AROM 2 x 10    HEP addition Access Code: [email or web address] URL: [email or web address] Date: 01/19/2024 Prepared by: Burnard Meth  Exercises - Sidelying Open Book Thoracic Lumbar Rotation and Extension  - 2 x daily - 7 x weekly - 2 sets - 10 reps - 2 hold  DATE:  01/17/24   TherEx:  Cat/cow: x 10 from raised mat table (lumbar/thoracic ROM) Prone: knees bent, trunk rotation x 4 bil holding 20 sec TherActivites:  Prone: opposite arm/leg  2 x 10 (core strengthening, shoulder ROM, hip extension) Prone: knee bent: reverse clams green TB 2 x 10 (hip mobility, hip strengthening) Sit to stand: 12# kettle bell 2 x 10  Leg Press: 100# 3 x 10 bil LE's (for improvements in functional squats) Manual:  Percussion and STM to pt's bil lumbar paraspinals, QL with concentration on Rt side    TODAY'S TREATMENT                                                                          DATE:  01/12/24   Therex: Prone hip ext 10x each leg Supine hooklying trunk rotation 15 sec x 3 bilateral  Supine hooklying  bridge 2-3 sec hold 3 x 10 with Green Tband Supine hooklying tband blue clam shell with contralateral leg hold x 20 bilateral  Open Book 10x10 sec each way Neuro Re Ed 5x3 sit to stand with tap onto 8 inch step for LE strength and balance Standing lumbar extension AROM 2 x 5    01/09/2024 Therex: Standing lumbar extension AROM 2 x 5  Lt sidelying regional rotation lumbar stretch 15 sec x 5 Supine hooklying trunk rotation 15 sec x 3 bilateral  Supine hooklying bridge 2-3 sec hold 2 x 10  Supine hooklying tband blue clam shell with contralateral leg hold x 20 bilateral  Supine hooklying pball press down UE 10 second hold x 10  5x sit to stand for LE strength.   HEP review with updates given.    Manual: Percussive device STM to Rt lumbar paraspinals, QL in Lt sidelying.       PATIENT EDUCATION:  01/09/2024 Education details: HEP update Person educated: Patient Education method: Programmer, multimedia, Demonstration, Verbal cues, and Handouts Education comprehension: verbalized understanding, returned demonstration, and verbal cues required  HOME EXERCISE PROGRAM: Access Code: [email or web address] URL: [email or web address] Date: 01/24/2024 Prepared by: Delon Lunger  Exercises - Standing Quadratus Lumborum Stretch with Doorway  - 1 x daily - 7 x weekly - 5 reps - 10 seconds hold - Standing Hip Hiking  - 2 x daily - 7 x weekly - 2 sets - 10 reps - Hooklying Single Knee to Chest  Stretch  - 2 x daily - 7 x weekly - 3 reps - 20 seconds hold - Supine Lower Trunk Rotation  - 1-2 x daily - 7 x weekly - 3 reps - 20 seconds hold - Sidelying Lumbar Rotation Stretch  - 1-2 x daily - 7 x weekly - 1 sets - 3-5 reps - 15 hold - Supine Bridge  - 1-2 x daily - 7 x weekly - 1-2 sets - 10 reps - 2 hold - Standing Lumbar Extension  - 2-4 x daily - 7 x weekly - 1 sets - 5-10 reps - Sidelying Thoracic Rotation with Open Book  - 2 x daily - 7 x weekly - 10 reps - 2 seconds hold - Prone Alternating Arm  and Leg Lifts  - 1 x daily - 7 x weekly - 20 reps - Prone Press Up On Elbows  - 1 x daily - 7 x weekly - 3 reps - 60 sec hold  ASSESSMENT:  CLINICAL IMPRESSION: Pt needed VC for prone props.  Demonstrated understanding. OBJECTIVE IMPAIRMENTS: Abnormal gait, difficulty walking, decreased ROM, decreased strength, impaired flexibility, postural dysfunction, and pain.   ACTIVITY LIMITATIONS: bending, sitting, squatting, sleeping, and dancing  PARTICIPATION LIMITATIONS: driving and community activity  PERSONAL FACTORS: see PMH and pertinent history above are also affecting patient's functional outcome.   REHAB POTENTIAL: Good  CLINICAL DECISION MAKING: Stable/uncomplicated  EVALUATION COMPLEXITY: Low   GOALS: Goals reviewed with patient? Yes  SHORT TERM GOALS: (target date for Short term goals are 3 weeks 01/09/2024)   1.  Patient will demonstrate independent use of home exercise program to maintain progress from in clinic treatments. Goal status: Met for initial HEP. 12/27/23  2. Pt will improve his 5 time sit to stand to </= 10 seconds with no UE support.   Goal status: partially met 01/09/2024  LONG TERM GOALS: (target dates for all long term goals are 10 weeks 03/02/2024 )   1. Patient will demonstrate/report pain at worst less than or equal to 2/10 to facilitate minimal limitation in daily activity secondary to pain symptoms. Goal status: New   2. Patient will demonstrate independent use of home exercise program to facilitate ability to maintain/progress functional gains from skilled physical therapy services.  Goal status: New   3. Patient will demonstrate Patient specific functional scale avg > or = 7 to indicate reduced disability due to condition.  Goal status: New   4.  Patient will demonstrate Rt LE quad strength will increase by >/= 5 pounds throughout to faciltiate usual transfers, stairs, squatting at PLOF for daily life.  Goal status: New   5.  Patient will  demonstrate up and down 1 flight of stairs with single hand rail with pain </= 2/10.  Goal status: New   6.  Pt will reporting return to dancing with pain </= 2/10.  Goal status: New    PLAN:  PT FREQUENCY: 1-2x/week  PT DURATION: 10 weeks  PLANNED INTERVENTIONS: Can include 02853- PT Re-evaluation, 97110-Therapeutic exercises, 97530- Therapeutic activity, [id number]- Neuromuscular re-education, 97535- Self Care, 97140- Manual therapy, [phone]- Gait training, [phone]- Orthotic Fit/training, [phone]- Canalith repositioning, [id number]- Aquatic Therapy, [phone]- Electrical stimulation (unattended), [id number] Physical performance testing, 97016- Vasopneumatic device, [id number]- Ultrasound, [id number]- Traction (mechanical), [id number]- Ionotophoresis 4mg /ml Dexamethasone ,  [id number] - Needle insertion w/o injection 1 or 2 muscles, 20561 - Needle insertion w/o injection 3 or more muscles.   Patient/Family education, Balance training, Stair training, Taping, Dry Needling, Joint mobilization, Joint manipulation, Spinal manipulation, Spinal  mobilization, Scar mobilization, Vestibular training, Visual/preceptual remediation/compensation, DME instructions, Cryotherapy, and Moist heat.  All performed as medically necessary.  All included unless contraindicated  PLAN FOR NEXT SESSION: Reassess for discharge in next couple visits.               Burnard Meth, PT 02/09/24  11:36 AM      Date of referral: 11/29/23 Referring provider: Jerri Kay HERO, MD  Referring diagnosis?  M79.604 (ICD-10-CM) - Pain in right leg  M54.16 (ICD-10-CM) - Radiculopathy, lumbar region   Treatment diagnosis? (if different than referring diagnosis) M54.59, r26.2, m62.81, m79.604  What was this (referring dx) caused by? Arthritis  Nature of Condition: Initial Onset (within last 3 months)   Laterality: Rt  Current Functional Measure Score: Patient Specific Functional Scale 5  Objective measurements identify impairments when they are compared to  normal values, the uninvolved extremity, and prior level of function.  [x]  Yes  []  No  Objective assessment of functional ability: Minimal functional limitations   Briefly describe symptoms: pain, weakness and radiation down Rt LE  How did symptoms start: over time  Average pain intensity:  Last 24 hours: 6/10  Past week: 7/10  How often does the pt experience symptoms? Frequently  How much have the symptoms interfered with usual daily activities? Moderately  How has condition changed since care began at this facility? No change first treatment visit  In general, how is the patients overall health? Very Good   BACK PAIN (STarT Back Screening Tool) Has pain spread down the leg(s) at some time in the last 2 weeks? yes Has there been pain in the shoulder or neck at some time in the last 2 weeks? no Has the pt only walked short distances because of back pain? yes Has patient dressed more slowly because of back pain in the past 2 weeks? yes Does patient think it's not safe for a person with this condition to be physically active? no Does patient have worrying thoughts a lot of the time? yes Does patient feel back pain is terrible and will never get any better? no Has patient stopped enjoying things they usually enjoy? some

## 2024-02-09 ENCOUNTER — Ambulatory Visit: Payer: Self-pay

## 2024-02-09 DIAGNOSIS — M79604 Pain in right leg: Secondary | ICD-10-CM

## 2024-02-09 DIAGNOSIS — R262 Difficulty in walking, not elsewhere classified: Secondary | ICD-10-CM | POA: Diagnosis not present

## 2024-02-09 DIAGNOSIS — M5459 Other low back pain: Secondary | ICD-10-CM | POA: Diagnosis not present

## 2024-02-09 DIAGNOSIS — M6281 Muscle weakness (generalized): Secondary | ICD-10-CM | POA: Diagnosis not present

## 2024-02-14 ENCOUNTER — Ambulatory Visit (INDEPENDENT_AMBULATORY_CARE_PROVIDER_SITE_OTHER): Admitting: Physical Therapy

## 2024-02-14 ENCOUNTER — Encounter: Payer: Self-pay | Admitting: Physical Therapy

## 2024-02-14 DIAGNOSIS — M79604 Pain in right leg: Secondary | ICD-10-CM | POA: Diagnosis not present

## 2024-02-14 DIAGNOSIS — M5459 Other low back pain: Secondary | ICD-10-CM | POA: Diagnosis not present

## 2024-02-14 DIAGNOSIS — M6281 Muscle weakness (generalized): Secondary | ICD-10-CM | POA: Diagnosis not present

## 2024-02-14 DIAGNOSIS — R262 Difficulty in walking, not elsewhere classified: Secondary | ICD-10-CM | POA: Diagnosis not present

## 2024-02-14 NOTE — Therapy (Signed)
 OUTPATIENT PHYSICAL THERAPY TREATMENT   Patient Name: Nicholas Rasmussen MRN: [id number] DOB:[birth year], 78 y.o., male Today's Date: 02/14/2024  END OF SESSION:  PT End of Session - 02/14/24 1105     Visit Number 12    Number of Visits 20    Date for Recertification  03/02/24    PT Start Time 1100    PT Stop Time 1140    PT Time Calculation (min) 40 min    Activity Tolerance Patient tolerated treatment well    Behavior During Therapy [facility] for tasks assessed/performed                    Past Medical History:  Diagnosis Date   Allergic rhinitis 05/07/2014   Arthritis    Diabetes mellitus without complication (HCC)    Dizziness 01/02/2016   GERD (gastroesophageal reflux disease)    Headache 03/13/2015   Hx of adenomatous colonic polyps 10/04/2023   Kidney lesion 07/22/2020   right, followed by urology   Left flank pain 01/02/2016   Low back pain 06/06/2015   Pain of left side of body 08/13/2015   Post herpetic neuralgia    Routine general medical examination at a health care facility 05/07/2014   Seizure disorder (HCC) 05/07/2014   Seizures (HCC)    Last seizure in the 70s.   Shingles    Stye 05/07/2014   Past Surgical History:  Procedure Laterality Date   COLONOSCOPY     KNEE SURGERY Left    left miniscus   TOTAL KNEE ARTHROPLASTY Left 02/28/2023   Procedure: TOTAL KNEE ARTHROPLASTY;  Surgeon: Jerri Kay HERO, MD;  Location: [facility] OR;  Service: Orthopedics;  Laterality: Left;   UPPER GASTROINTESTINAL ENDOSCOPY     uvala surgery     Patient Active Problem List   Diagnosis Date Noted   Hx of adenomatous colonic polyps 10/04/2023   Belching 08/26/2023   Status post total left knee replacement 02/28/2023   Renal mass, right; Bosniak 4 complex cyst; 1 cm 09/09/2022   Acute medial meniscus tear of left knee 02/04/2022   Primary osteoarthritis of left knee 02/04/2022   Aortic root dilatation 12/18/2021   Slow transit constipation 12/08/2021   Left-sided chest  wall pain 12/08/2021   Multiple pulmonary nodules 10/06/2021   Hyperlipidemia associated with type 2 diabetes mellitus (HCC) 08/19/2021   [name] 04/07/2021   Complex renal cyst 04/07/2021   Varicose veins of both lower extremities with pain 02/02/2019   Screening for colon cancer 09/19/2018   Type 2 diabetes mellitus with diabetic polyneuropathy, without long-term current use of insulin  (HCC) 03/29/2018   Diabetic neuropathy (HCC) 03/29/2018   Chronic pain of left knee 03/23/2018   Coronary artery disease involving native coronary artery of native heart with angina pectoris 08/03/2017   Post herpetic neuralgia 07/29/2016   Dizziness 01/02/2016   Chronic midline low back pain without sciatica 06/06/2015   Nonintractable episodic headache 03/13/2015   GERD (gastroesophageal reflux disease) 11/15/2014   Seizure disorder (HCC) 05/07/2014   Allergic rhinitis 05/07/2014    PCP: Katheen Roselie Rockford, NP   REFERRING PROVIDER: Jerri Kay HERO, MD   REFERRING DIAG:  Diagnosis  M79.604 (ICD-10-CM) - Pain in right leg  M54.16 (ICD-10-CM) - Radiculopathy, lumbar region    THERAPY DIAG:  Difficulty in walking, not elsewhere classified  Other low back pain  Muscle weakness (generalized)  Pain in right leg  Rationale for Evaluation and Treatment: Rehabilitation  ONSET DATE: 7-8  months  SUBJECTIVE:   SUBJECTIVE STATEMENT: Pt states back pain has been better since last visit.  PERTINENT HISTORY: DM, seizure disorder, arthritis, see PMH above  PAIN:  PRS scale: 1/10 Pain location: low back Pain description: achy, concentrated in low back mostly Aggravating factors: leaning and moving in certain positions Relieving factors: changing positions  PRECAUTIONS: None  WEIGHT BEARING RESTRICTIONS: No  FALLS:  Has patient fallen in last 6 months? No  LIVING ENVIRONMENT: Lives with: lives with their family and lives with their  spouse Lives in: House/apartment Stairs: Yes: External: 15 steps; bilateral but cannot reach both Has following equipment at home: None  OCCUPATION: retired  PLOF: Independent  PATIENT GOALS: dance without pain   OBJECTIVE:   DIAGNOSTIC FINDINGS: Result Narrative 11/29/23  X-rays of the lumbar spine show degenerative changes.  No acute abnormalities.  PATIENT SURVEYS:  Patient-Specific Activity Scoring Scheme  0 represents "unable to perform." 10 represents "able to perform at prior level. 0 1 2 3 4 5 6 7 8 9  10 (Date and Score)   Activity Eval  12/19/23  02/14/24   1. dancing  6 10   2. Lifting items from floor (watermelon)  4  8  3. Stairs navigation 5 10  4.    5.    Score 5 9.3   Total score = sum of the activity scores/number of activities Minimum detectable change (90%CI) for average score = 2 points Minimum detectable change (90%CI) for single activity score = 3 points  COGNITION: 12/19/2023 Overall cognitive status: WFL    SENSATION: 12/19/2023 Numbness and tingling down Rt LE into lateral thigh to knee  POSTURE:  12/19/2023 rounded shoulders and forward head    LOWER EXTREMITY ROM:   ROM Right Eval 12/19/23  Left eval  Hip flexion 115 115  Hip extension    Hip abduction    Hip adduction    Hip internal rotation    Hip external rotation    Knee flexion    Knee extension    Ankle dorsiflexion    Ankle plantarflexion    Ankle inversion    Ankle eversion     (Blank rows = not tested)   LUMBAR ROM:   ROM AROM  Eval 12/19/2023 AROM 01/09/2024 AROM 02/14/24  Flexion 70 c pain in low back  [facility]  Extension 10 c pain 25% WFL with end range pain  X5 in standing improved to 50% with reduce complaints.  WFL  Right lateral flexion 22 c pain    Left lateral flexion 25    Right rotation [facility]  WFL  Left rotation [facility]  WFL   (Blank rows = not tested)    LOWER EXTREMITY MMT:  MMT Right Eval 12/19/23 Left Eval 12/19/2023  Hip flexion 40.3  44.5  Hip extension    Hip abduction    Hip adduction    Hip internal rotation    Hip external rotation    Knee flexion 63.8 65.4  Knee extension 53.1 60.2  Ankle dorsiflexion    Ankle plantarflexion    Ankle inversion    Ankle eversion     (Blank rows = not tested)  SPECIAL TESTS:  12/19/23 Slump test: positive on Rt   FUNCTIONAL TESTS:  01/09/2024: 5x sit to stand no UE: 12 seconds   12/19/23: 5 time sit to stand: 13.75 seconds no UE support  GAIT: 12/19/2023 Distance walked: clinic distances Assistive device utilized: None Level of assistance: Complete Independence Comments: mild antalgic gait pattern  with wide BOS                                                                                                                                                                       TODAY'S TREATMENT                                                                           Date: 02/14/24:  TherEx:  Standing trunk extension x 10 elbows on wall  Standing lateral trunk flexion c 5 # hand weights x 10 bil sides alternating  c slow controlled movements  Standing trunk rotation: using 3# bar behind pt's back x 10  TherActivites:  Double Leg Press: 106# 2 x 15,  Single Leg Press: 56# 2 x 15  Single leg dead lift 15 # 2 x 10  Standing squat to 6 inch step lifting 15# x 10 (pt edu in correct body mechanics) Golfer's lift 5# x 10  from 6 inch step, x 5 from lifting from floor BATCA rows: 45# 2 x 10 BATCA Lat pull downs: 40# 2 x 10  Up and down 1 flight of stairs no rail with reciprocal gait     Date: 02/09/24 Rec Bike  level 2   Double Leg Press: 112# 3x 15 Single leg press: 62#  2x 15 bil LE Walking with 20# kettle bell in his left UE for 100 feet  Sit to stand c 12 # kettle bell in bil UE's 2x10 Prone opp arm/opp leg 2x10 Prone on elbows 10x Open book 10x each way Manual Percussive device STM to Rt lumbar paraspinals, QL in Lt sidelying.     DATE:01/26/24 Rec Bike  level 5 7 min Double Leg Press: 112# 3x 15 Single leg press: 62#  2x 15 bil LE Walking with 20# kettle bell in his left UE for 100 feet  Sit to stand c 12 # kettle bell in bil UE's x 10  Prone opp arm/opp leg 2x10 Prone on elbows 10x Open book 10x each way Manual Percussive device STM to Rt lumbar paraspinals, QL in Lt sidelying.       TODAY'S TREATMENT  DATE:01/24/24 Therex: Reviwed pt's entire HEP TherActivites: (for improvements in functional mobility, endurance, bending, lifting, pushing, pulling, for house hold activities and yard work) Recumbent bike: 5 minutes, level 5  Double Leg Press: 112# 2 x 15 Single leg press: 56# x 15 bil LE Walking with 20# kettle bell in his left UE for 100 feet  Sit to stand c 12 # kettle bell in bil UE's x 10  Manual Lumbar grade 2-3 PA mobs L2-L5 STM to pt's Rt QL and piriformis, glute medius     PATIENT EDUCATION:  01/09/2024 Education details: HEP update Person educated: Patient Education method: Programmer, multimedia, Demonstration, Verbal cues, and Handouts Education comprehension: verbalized understanding, returned demonstration, and verbal cues required  HOME EXERCISE PROGRAM: Access Code: [email or web address] URL: [email or web address] Date: 01/24/2024 Prepared by: Delon Lunger  Exercises - Standing Quadratus Lumborum Stretch with Doorway  - 1 x daily - 7 x weekly - 5 reps - 10 seconds hold - Standing Hip Hiking  - 2 x daily - 7 x weekly - 2 sets - 10 reps - Hooklying Single Knee to Chest Stretch  - 2 x daily - 7 x weekly - 3 reps - 20 seconds hold - Supine Lower Trunk Rotation  - 1-2 x daily - 7 x weekly - 3 reps - 20 seconds hold - Sidelying Lumbar Rotation Stretch  - 1-2 x daily - 7 x weekly - 1 sets - 3-5 reps - 15 hold - Supine Bridge  - 1-2 x daily - 7 x weekly - 1-2 sets - 10 reps - 2 hold - Standing Lumbar Extension  - 2-4 x daily - 7 x weekly - 1 sets  - 5-10 reps - Sidelying Thoracic Rotation with Open Book  - 2 x daily - 7 x weekly - 10 reps - 2 seconds hold - Prone Alternating Arm and Leg Lifts  - 1 x daily - 7 x weekly - 20 reps - Prone Press Up On Elbows  - 1 x daily - 7 x weekly - 3 reps - 60 sec hold  ASSESSMENT:  CLINICAL IMPRESSION: Pt making progress with trunk mobility and reporting no pain at rest. We discussed discharge following his week's trip to [place]  next week if his pain doesn't worsen. Pt has currently met 3 of his 6 LTG's set at his initial evaluation.   OBJECTIVE IMPAIRMENTS: Abnormal gait, difficulty walking, decreased ROM, decreased strength, impaired flexibility, postural dysfunction, and pain.   ACTIVITY LIMITATIONS: bending, sitting, squatting, sleeping, and dancing  PARTICIPATION LIMITATIONS: driving and community activity  PERSONAL FACTORS: see PMH and pertinent history above are also affecting patient's functional outcome.   REHAB POTENTIAL: Good  CLINICAL DECISION MAKING: Stable/uncomplicated  EVALUATION COMPLEXITY: Low   GOALS: Goals reviewed with patient? Yes  SHORT TERM GOALS: (target date for Short term goals are 3 weeks 01/09/2024)   1.  Patient will demonstrate independent use of home exercise program to maintain progress from in clinic treatments. Goal status: Met for initial HEP. 12/27/23  2. Pt will improve his 5 time sit to stand to </= 10 seconds with no UE support.   Goal status: partially met 01/09/2024  LONG TERM GOALS: (target dates for all long term goals are 10 weeks 03/02/2024 )   1. Patient will demonstrate/report pain at worst less than or equal to 2/10 to facilitate minimal limitation in daily activity secondary to pain symptoms. Goal status: Met 02/14/24   2. Patient will demonstrate independent use of home exercise program to facilitate ability  to maintain/progress functional gains from skilled physical therapy services.  Goal status: New   3. Patient will demonstrate  Patient specific functional scale avg > or = 7 to indicate reduced disability due to condition.  Goal status: Met 02/14/24   4.  Patient will demonstrate Rt LE quad strength will increase by >/= 5 pounds throughout to faciltiate usual transfers, stairs, squatting at PLOF for daily life.  Goal status: New   5.  Patient will demonstrate up and down 1 flight of stairs with single hand rail with pain </= 2/10.  Goal status: Met 02/14/24   6.  Pt will reporting return to dancing with pain </= 2/10.  Goal status: New    PLAN:  PT FREQUENCY: 1-2x/week  PT DURATION: 10 weeks  PLANNED INTERVENTIONS: Can include 02853- PT Re-evaluation, 97110-Therapeutic exercises, 97530- Therapeutic activity, [id number]- Neuromuscular re-education, 97535- Self Care, 97140- Manual therapy, [phone]- Gait training, [phone]- Orthotic Fit/training, [phone]- Canalith repositioning, [id number]- Aquatic Therapy, [phone]- Electrical stimulation (unattended), [id number] Physical performance testing, 97016- Vasopneumatic device, [id number]- Ultrasound, [id number]- Traction (mechanical), [id number]- Ionotophoresis 4mg /ml Dexamethasone ,  79439 - Needle insertion w/o injection 1 or 2 muscles, 20561 - Needle insertion w/o injection 3 or more muscles.   Patient/Family education, Balance training, Stair training, Taping, Dry Needling, Joint mobilization, Joint manipulation, Spinal manipulation, Spinal mobilization, Scar mobilization, Vestibular training, Visual/preceptual remediation/compensation, DME instructions, Cryotherapy, and Moist heat.  All performed as medically necessary.  All included unless contraindicated  PLAN FOR NEXT SESSION: Planning discharge next visit.               Delon Lunger, PT, MPT 02/14/24 11:40 AM   02/14/24  11:40 AM      Date of referral: 11/29/23 Referring provider: Jerri Kay HERO, MD  Referring diagnosis?  M79.604 (ICD-10-CM) - Pain in right leg  M54.16 (ICD-10-CM) - Radiculopathy, lumbar region   Treatment  diagnosis? (if different than referring diagnosis) M54.59, r26.2, m62.81, m79.604  What was this (referring dx) caused by? Arthritis  Nature of Condition: Initial Onset (within last 3 months)   Laterality: Rt  Current Functional Measure Score: Patient Specific Functional Scale 5  Objective measurements identify impairments when they are compared to normal values, the uninvolved extremity, and prior level of function.  [x]  Yes  []  No  Objective assessment of functional ability: Minimal functional limitations   Briefly describe symptoms: pain, weakness and radiation down Rt LE  How did symptoms start: over time  Average pain intensity:  Last 24 hours: 6/10  Past week: 7/10  How often does the pt experience symptoms? Frequently  How much have the symptoms interfered with usual daily activities? Moderately  How has condition changed since care began at this facility? No change first treatment visit  In general, how is the patients overall health? Very Good   BACK PAIN (STarT Back Screening Tool) Has pain spread down the leg(s) at some time in the last 2 weeks? yes Has there been pain in the shoulder or neck at some time in the last 2 weeks? no Has the pt only walked short distances because of back pain? yes Has patient dressed more slowly because of back pain in the past 2 weeks? yes Does patient think it's not safe for a person with this condition to be physically active? no Does patient have worrying thoughts a lot of the time? yes Does patient feel back pain is terrible and will never get any better? no Has patient stopped enjoying things they usually enjoy? some

## 2024-02-20 ENCOUNTER — Institutional Professional Consult (permissible substitution) (INDEPENDENT_AMBULATORY_CARE_PROVIDER_SITE_OTHER): Admitting: Otolaryngology

## 2024-02-21 ENCOUNTER — Encounter: Admitting: Physical Therapy

## 2024-02-23 ENCOUNTER — Encounter (INDEPENDENT_AMBULATORY_CARE_PROVIDER_SITE_OTHER): Payer: Self-pay | Admitting: Otolaryngology

## 2024-02-23 ENCOUNTER — Ambulatory Visit (INDEPENDENT_AMBULATORY_CARE_PROVIDER_SITE_OTHER): Admitting: Otolaryngology

## 2024-02-23 VITALS — BP 121/72 | HR 52 | Ht 76.0 in | Wt 249.0 lb

## 2024-02-23 DIAGNOSIS — J3489 Other specified disorders of nose and nasal sinuses: Secondary | ICD-10-CM

## 2024-02-23 DIAGNOSIS — Z87891 Personal history of nicotine dependence: Secondary | ICD-10-CM | POA: Diagnosis not present

## 2024-02-23 DIAGNOSIS — K219 Gastro-esophageal reflux disease without esophagitis: Secondary | ICD-10-CM | POA: Diagnosis not present

## 2024-02-23 NOTE — Progress Notes (Signed)
 Dear Dr. Sebastian, Here is my assessment for our mutual patient, Nicholas Rasmussen. Thank you for allowing me the opportunity to care for your patient. Please do not hesitate to contact me should you have any other questions. Sincerely, Dr. Eldora Blanch  Otolaryngology Clinic Note Referring provider: Dr. Sebastian HPI:  Nicholas Rasmussen is a 78 y.o. male kindly referred by Dr. Sebastian for evaluation of nasal lesion and GERD  Initial visit (02/2024): Patient reports that he has had a left nasal esion ongoing for many months. Has not changed significantly. No pain, epistaxis, facial numbness. Worrisome for him currently given persistence. No nasal trauma, h/o frequent sinus infections. Some b/l mucoid rhinorrhea. Denies CRS sx. No woodworking. Currently on flonase .  Does have h/o GERD but better now with PPI; He had a recent EGD which showed normal esophagus. Some dry cough but this is better. Patient otherwise denies: - dysphagia, odynophagia, need for Heimlich, unintentional weight loss - changes in voice, hemoptysis - ear pain, neck masses   Tobacco: prior, quit 1990  H&N Surgery: UPPP Personal or FHx of bleeding dz or anesthesia difficulty: no  GLP-1: no AP/AC: no  PMHx: GERD, CAD, HLD, DM. Seizures, Post-herpetic Neuralgia, MI  Independent Review of Additional Tests or Records:  Dr. Sebastian (12/08/2023): noted dry cough and right ear feels stopped up with noted PND. Also noted left nasal lesion; Rx: Likely due to GERD and start PPI, flonase ; Ref to ENT for nasal lesion Nicholas Rasmussen (08/26/2023): noted acid reflux, belching and bloating; EGD 2019 - chronic gastritis EGD 09/28/2023: noted normal esophagus CBC and BMP 07/17/2023: WBC 5.9; BUN/Cr 22/1.17 PMH/Meds/All/SocHx/FamHx/ROS:   Past Medical History:  Diagnosis Date   Allergic rhinitis 05/07/2014   Arthritis    Diabetes mellitus without complication (HCC)    Dizziness 01/02/2016   GERD (gastroesophageal reflux disease)     Headache 03/13/2015   Hx of adenomatous colonic polyps 10/04/2023   Kidney lesion 07/22/2020   right, followed by urology   Left flank pain 01/02/2016   Low back pain 06/06/2015   Pain of left side of body 08/13/2015   Post herpetic neuralgia    Routine general medical examination at a health care facility 05/07/2014   Seizure disorder (HCC) 05/07/2014   Seizures (HCC)    Last seizure in the 70s.   Shingles    Stye 05/07/2014     Past Surgical History:  Procedure Laterality Date   COLONOSCOPY     KNEE SURGERY Left    left miniscus   TOTAL KNEE ARTHROPLASTY Left 02/28/2023   Procedure: TOTAL KNEE ARTHROPLASTY;  Surgeon: Jerri Kay HERO, MD;  Location: [facility] OR;  Service: Orthopedics;  Laterality: Left;   UPPER GASTROINTESTINAL ENDOSCOPY     uvala surgery      Family History  Problem Relation Age of Onset   Other Mother        natural causes   Diabetes Father    Hypertension Father    Kidney cancer Brother    Colon cancer Neg Hx    Stomach cancer Neg Hx    Esophageal cancer Neg Hx    Colon polyps Neg Hx    Rectal cancer Neg Hx      Social Connections: Socially Integrated (08/12/2023)   Social Connection and Isolation Panel    Frequency of Communication with Friends and Family: More than three times a week    Frequency of Social Gatherings with Friends and Family: Twice a week    Attends Religious Services: More than  4 times per year    Active Member of Clubs or Organizations: Yes    Attends Banker Meetings: More than 4 times per year    Marital Status: Married      Current Outpatient Medications:    acyclovir  ointment (ZOVIRAX ) 5 %, Apply 1 Application topically every 6 (six) hours as needed., Disp: 30 g, Rfl: 0   atorvastatin  (LIPITOR) 40 MG tablet, Take 1 tablet (40 mg total) by mouth every evening., Disp: 90 tablet, Rfl: 2   Cholecalciferol (VITAMIN D-3) 125 MCG (5000 UT) TABS, Take 5,000 Units by mouth daily., Disp: , Rfl:    clotrimazole  (LOTRIMIN ) 1  % cream, Apply topically., Disp: , Rfl:    Collagen-Vitamin C-Biotin (COLLAGEN PO), Take 1 Dose by mouth daily., Disp: , Rfl:    divalproex  (DEPAKOTE  ER) 500 MG 24 hr tablet, Take 1 tablet (500 mg total) by mouth at bedtime., Disp: 90 tablet, Rfl: 3   esomeprazole  (NEXIUM ) 40 MG capsule, Take 1 capsule (40 mg total) by mouth daily before breakfast. Take 30-60 min before eating, Disp: 30 capsule, Rfl: 3   fluticasone  (FLONASE ) 50 MCG/ACT nasal spray, Place 2 sprays into both nostrils daily., Disp: 16 g, Rfl: 6   gabapentin  (NEURONTIN ) 300 MG capsule, TAKE 1 CAPSULE BY MOUTH EVERYDAY AT BEDTIME, Disp: 90 capsule, Rfl: 3   glipiZIDE  (GLUCOTROL  XL) 5 MG 24 hr tablet, TAKE 1 TABLET BY MOUTH EVERY DAY WITH BREAKFAST, Disp: 90 tablet, Rfl: 3   ipratropium (ATROVENT) 0.03 % nasal spray, Place 2 sprays into both nostrils daily., Disp: , Rfl:    meclizine  (ANTIVERT ) 25 MG tablet, Take 1 tablet (25 mg total) by mouth 3 (three) times daily as needed for dizziness., Disp: 30 tablet, Rfl: 0   methocarbamol  (ROBAXIN -750) 750 MG tablet, Take 1 tablet (750 mg total) by mouth 2 (two) times daily as needed for muscle spasms., Disp: 20 tablet, Rfl: 2   metoprolol  tartrate (LOPRESSOR ) 25 MG tablet, TAKE 1/2 TABLET BY MOUTH EVERY DAY, Disp: 45 tablet, Rfl: 1   nitroGLYCERIN  (NITROSTAT ) 0.4 MG SL tablet, Place 1 tablet (0.4 mg total) under the tongue every 5 (five) minutes as needed for chest pain., Disp: 30 tablet, Rfl: 0   OVER THE COUNTER MEDICATION, Multi collagen 1 scoop in the am, Disp: , Rfl:    Probiotic Product (PROBIOTIC PO), Take 1 tablet by mouth daily., Disp: , Rfl:    Physical Exam:   BP 121/72 (BP Location: Right Arm, Patient Position: Sitting, Cuff Size: Large)   Pulse (!) 52   Ht 6' 4 (1.93 m)   Wt 249 lb (112.9 kg)   SpO2 98%   BMI 30.31 kg/m   Salient findings:  CN II-XII intact Bilateral EAC clear and TM intact with well pneumatized middle ear spaces Anterior rhinoscopy: Septum intact;  bilateral inferior turbinates without significant hypertrophy; noted left intranasal lesion  along nasal sill -- appears benign/consistent with possible skin tag(?); no other lesions appreciated on anterior rhino No lesions of oral cavity/oropharynx No obviously palpable neck masses/lymphadenopathy/thyromegaly No respiratory distress or stridor  Seprately Identifiable Procedures:  Prior to initiating any procedures, risks/benefits/alternatives were explained to the patient and verbal consent obtained. None  Impression & Plans:  Nicholas Rasmussen is a 78 y.o. male with:  1. Internal nasal lesion   2. Gastroesophageal reflux disease without esophagitis    Appears to be benign nasal lesion, no other significant rhinologic contributory history. We discussed options including observation v/s excision -- he opted  for excision. Will schedule in the office GERD - discussed lifestyle changes; on PPI, recent reassuring EGD per GI; rec adding barrier agent such as reflux gourmet  - f/u 4-6 weeks at his convenience for lesion excision  See below regarding exact medications prescribed this encounter including dosages and route: No orders of the defined types were placed in this encounter.     Thank you for allowing me the opportunity to care for your patient. Please do not hesitate to contact me should you have any other questions.  Sincerely, Eldora Blanch, MD Otolaryngologist (ENT), [facility] Phone: [phone] Fax: [phone]  03/04/2024, 9:21 AM   MDM:  Level 4 - [id number] Complexity/Problems addressed: mod - multiple chronic problems Data complexity: mod - independent review of note, labs, review test - Morbidity: low  - Prescription Drug prescribed or managed: no

## 2024-02-28 ENCOUNTER — Encounter: Payer: Self-pay | Admitting: Physical Therapy

## 2024-02-28 ENCOUNTER — Ambulatory Visit: Admitting: Physical Therapy

## 2024-02-28 DIAGNOSIS — M5459 Other low back pain: Secondary | ICD-10-CM

## 2024-02-28 DIAGNOSIS — M6281 Muscle weakness (generalized): Secondary | ICD-10-CM | POA: Diagnosis not present

## 2024-02-28 DIAGNOSIS — R262 Difficulty in walking, not elsewhere classified: Secondary | ICD-10-CM | POA: Diagnosis not present

## 2024-02-28 DIAGNOSIS — M79604 Pain in right leg: Secondary | ICD-10-CM

## 2024-02-28 NOTE — Therapy (Signed)
 OUTPATIENT PHYSICAL THERAPY TREATMENT  DISCHARGE    Patient Name: Nicholas Rasmussen MRN: [id number] DOB:[birth year], 78 y.o., male Today's Date: 02/28/2024  END OF SESSION:  PT End of Session - 02/28/24 1217     Visit Number 13    Number of Visits 20    Date for Recertification  03/02/24    PT Start Time 1145    PT Stop Time 1223    PT Time Calculation (min) 38 min    Activity Tolerance Patient tolerated treatment well    Behavior During Therapy [facility] for tasks assessed/performed              Past Medical History:  Diagnosis Date   Allergic rhinitis 05/07/2014   Arthritis    Diabetes mellitus without complication (HCC)    Dizziness 01/02/2016   GERD (gastroesophageal reflux disease)    Headache 03/13/2015   Hx of adenomatous colonic polyps 10/04/2023   Kidney lesion 07/22/2020   right, followed by urology   Left flank pain 01/02/2016   Low back pain 06/06/2015   Pain of left side of body 08/13/2015   Post herpetic neuralgia    Routine general medical examination at a health care facility 05/07/2014   Seizure disorder (HCC) 05/07/2014   Seizures (HCC)    Last seizure in the 70s.   Shingles    Stye 05/07/2014   Past Surgical History:  Procedure Laterality Date   COLONOSCOPY     KNEE SURGERY Left    left miniscus   TOTAL KNEE ARTHROPLASTY Left 02/28/2023   Procedure: TOTAL KNEE ARTHROPLASTY;  Surgeon: Jerri Kay HERO, MD;  Location: [facility] OR;  Service: Orthopedics;  Laterality: Left;   UPPER GASTROINTESTINAL ENDOSCOPY     uvala surgery     Patient Active Problem List   Diagnosis Date Noted   Hx of adenomatous colonic polyps 10/04/2023   Belching 08/26/2023   Status post total left knee replacement 02/28/2023   Renal mass, right; Bosniak 4 complex cyst; 1 cm 09/09/2022   Acute medial meniscus tear of left knee 02/04/2022   Primary osteoarthritis of left knee 02/04/2022   Aortic root dilatation 12/18/2021   Slow transit constipation 12/08/2021   Left-sided chest  wall pain 12/08/2021   Multiple pulmonary nodules 10/06/2021   Hyperlipidemia associated with type 2 diabetes mellitus (HCC) 08/19/2021   [name] 04/07/2021   Complex renal cyst 04/07/2021   Varicose veins of both lower extremities with pain 02/02/2019   Screening for colon cancer 09/19/2018   Type 2 diabetes mellitus with diabetic polyneuropathy, without long-term current use of insulin  (HCC) 03/29/2018   Diabetic neuropathy (HCC) 03/29/2018   Chronic pain of left knee 03/23/2018   Coronary artery disease involving native coronary artery of native heart with angina pectoris 08/03/2017   Post herpetic neuralgia 07/29/2016   Dizziness 01/02/2016   Chronic midline low back pain without sciatica 06/06/2015   Nonintractable episodic headache 03/13/2015   GERD (gastroesophageal reflux disease) 11/15/2014   Seizure disorder (HCC) 05/07/2014   Allergic rhinitis 05/07/2014    PCP: Katheen Roselie Rockford, NP   REFERRING PROVIDER: Jerri Kay HERO, MD   REFERRING DIAG:  Diagnosis  M79.604 (ICD-10-CM) - Pain in right leg  M54.16 (ICD-10-CM) - Radiculopathy, lumbar region    THERAPY DIAG:  Difficulty in walking, not elsewhere classified  Other low back pain  Muscle weakness (generalized)  Pain in right leg  Rationale for Evaluation and Treatment: Rehabilitation  ONSET DATE: 7-8 months  SUBJECTIVE:  SUBJECTIVE STATEMENT: Pt states back pain has been better since last visit.  PERTINENT HISTORY: DM, seizure disorder, arthritis, see PMH above  PAIN:  PRS scale: 1/10 Pain location: low back Pain description: achy, concentrated in low back mostly Aggravating factors: leaning and moving in certain positions Relieving factors: changing positions  PRECAUTIONS: None  WEIGHT BEARING RESTRICTIONS: No  FALLS:  Has patient fallen in last 6 months? No  LIVING ENVIRONMENT: Lives with: lives with their family and lives with their  spouse Lives in: House/apartment Stairs: Yes: External: 15 steps; bilateral but cannot reach both Has following equipment at home: None  OCCUPATION: retired  PLOF: Independent  PATIENT GOALS: dance without pain   OBJECTIVE:   DIAGNOSTIC FINDINGS: Result Narrative 11/29/23  X-rays of the lumbar spine show degenerative changes.  No acute abnormalities.  PATIENT SURVEYS:  Patient-Specific Activity Scoring Scheme  0 represents "unable to perform." 10 represents "able to perform at prior level. 0 1 2 3 4 5 6 7 8 9  10 (Date and Score)   Activity Eval  12/19/23  02/14/24   1. dancing  6 10   2. Lifting items from floor (watermelon)  4  8  3. Stairs navigation 5 10  4.    5.    Score 5 9.3   Total score = sum of the activity scores/number of activities Minimum detectable change (90%CI) for average score = 2 points Minimum detectable change (90%CI) for single activity score = 3 points  COGNITION: 12/19/2023 Overall cognitive status: WFL    SENSATION: 12/19/2023 Numbness and tingling down Rt LE into lateral thigh to knee  POSTURE:  12/19/2023 rounded shoulders and forward head    LOWER EXTREMITY ROM:   ROM Right Eval 12/19/23  Left eval Rt / Left 02/28/24  Hip flexion 115 115 122 / 125  Hip extension     Hip abduction     Hip adduction     Hip internal rotation     Hip external rotation     Knee flexion     Knee extension     Ankle dorsiflexion     Ankle plantarflexion     Ankle inversion     Ankle eversion      (Blank rows = not tested)   LUMBAR ROM:   ROM AROM  Eval 12/19/2023 AROM 01/09/2024 AROM 02/14/24  Flexion 70 c pain in low back  [facility]  Extension 10 c pain 25% WFL with end range pain  X5 in standing improved to 50% with reduce complaints.  WFL  Right lateral flexion 22 c pain    Left lateral flexion 25    Right rotation [facility]  [facility]  Left rotation [facility]  WFL   (Blank rows = not tested)    LOWER EXTREMITY MMT:  MMT Right Eval 12/19/23  Left Eval 12/19/2023 Rt 02/28/24 Left  02/28/24  Hip flexion 40.3 44.5 46.7 50.5  Hip extension      Hip abduction      Hip adduction      Hip internal rotation      Hip external rotation      Knee flexion 63.8 65.4 68.9 71.1  Knee extension 53.1 60.2 123.18 135.1  Ankle dorsiflexion      Ankle plantarflexion      Ankle inversion      Ankle eversion       (Blank rows = not tested)  SPECIAL TESTS:  12/19/23 Slump test: positive on Rt   FUNCTIONAL TESTS:  02/28/24:  5 time sit to stand: 9.62 seconds no UE support  01/09/2024: 5x sit to stand no UE: 12 seconds   12/19/23: 5 time sit to stand: 13.75 seconds no UE support  GAIT: 12/19/2023 Distance walked: clinic distances Assistive device utilized: None Level of assistance: Complete Independence Comments: mild antalgic gait pattern with wide BOS                                                                                                                                                                       TODAY'S TREATMENT                                                                           Date: 02/28/24:  TherEx Bridge walk outs 2 x 10 Bridge clam shells 2 x 10  MMT and ROM updated this visit see charts above Reviwed HEP TherAct Sit to stand: 2 x 5 9.62 seconds  best time Double Leg Press: 150 2 x 15 Single Leg Press: 81# 2 x 10  NMR:  SLS: Rt 30 sec, Left 21 sec NO support Walking on airex beam x 6      TODAY'S TREATMENT                                                                           Date: 02/14/24:  TherEx:  Standing trunk extension x 10 elbows on wall  Standing lateral trunk flexion c 5 # hand weights x 10 bil sides alternating  c slow controlled movements  Standing trunk rotation: using 3# bar behind pt's back x 10  TherActivites:  Double Leg Press: 106# 2 x 15,  Single Leg Press: 56# 2 x 15  Single leg dead lift 15 # 2 x 10  Standing squat to 6 inch step lifting 15# x 10 (pt edu in  correct body mechanics) Golfer's lift 5# x 10  from 6 inch step, x 5 from lifting from floor BATCA rows: 45# 2 x 10 BATCA Lat pull downs: 40# 2 x 10  Up and down 1 flight of stairs no rail with reciprocal gait   Date: 02/09/24 Rec Bike  level 2   Double Leg Press:  112# 3x 15 Single leg press: 62#  2x 15 bil LE Walking with 20# kettle bell in his left UE for 100 feet  Sit to stand c 12 # kettle bell in bil UE's 2x10 Prone opp arm/opp leg 2x10 Prone on elbows 10x Open book 10x each way Manual Percussive device STM to Rt lumbar paraspinals, QL in Lt sidelying.     PATIENT EDUCATION:  01/09/2024 Education details: HEP update Person educated: Patient Education method: Programmer, multimedia, Demonstration, Verbal cues, and Handouts Education comprehension: verbalized understanding, returned demonstration, and verbal cues required  HOME EXERCISE PROGRAM: Access Code: [email or web address] URL: [email or web address] Date: 01/24/2024 Prepared by: Delon Lunger  Exercises - Standing Quadratus Lumborum Stretch with Doorway  - 1 x daily - 7 x weekly - 5 reps - 10 seconds hold - Standing Hip Hiking  - 2 x daily - 7 x weekly - 2 sets - 10 reps - Hooklying Single Knee to Chest Stretch  - 2 x daily - 7 x weekly - 3 reps - 20 seconds hold - Supine Lower Trunk Rotation  - 1-2 x daily - 7 x weekly - 3 reps - 20 seconds hold - Sidelying Lumbar Rotation Stretch  - 1-2 x daily - 7 x weekly - 1 sets - 3-5 reps - 15 hold - Supine Bridge  - 1-2 x daily - 7 x weekly - 1-2 sets - 10 reps - 2 hold - Standing Lumbar Extension  - 2-4 x daily - 7 x weekly - 1 sets - 5-10 reps - Sidelying Thoracic Rotation with Open Book  - 2 x daily - 7 x weekly - 10 reps - 2 seconds hold - Prone Alternating Arm and Leg Lifts  - 1 x daily - 7 x weekly - 20 reps - Prone Press Up On Elbows  - 1 x daily - 7 x weekly - 3 reps - 60 sec hold  ASSESSMENT:  CLINICAL IMPRESSION: Pt reporting he was able to travel to [place]  and his  back pain was manageable. Pt has me all his STG's and LTG's set at his initial evaluation and is being discharged from skilled PT with improved ROM and strength.    OBJECTIVE IMPAIRMENTS: Abnormal gait, difficulty walking, decreased ROM, decreased strength, impaired flexibility, postural dysfunction, and pain.   ACTIVITY LIMITATIONS: bending, sitting, squatting, sleeping, and dancing  PARTICIPATION LIMITATIONS: driving and community activity  PERSONAL FACTORS: see PMH and pertinent history above are also affecting patient's functional outcome.   REHAB POTENTIAL: Good  CLINICAL DECISION MAKING: Stable/uncomplicated  EVALUATION COMPLEXITY: Low   GOALS: Goals reviewed with patient? Yes  SHORT TERM GOALS: (target date for Short term goals are 3 weeks 01/09/2024)   1.  Patient will demonstrate independent use of home exercise program to maintain progress from in clinic treatments. Goal status: Met for initial HEP. 12/27/23  2. Pt will improve his 5 time sit to stand to </= 10 seconds with no UE support.   Goal status: partially met 01/09/2024, Met 02/28/24   LONG TERM GOALS: (target dates for all long term goals are 10 weeks 03/02/2024 )   1. Patient will demonstrate/report pain at worst less than or equal to 2/10 to facilitate minimal limitation in daily activity secondary to pain symptoms. Goal status: Met 02/14/24   2. Patient will demonstrate independent use of home exercise program to facilitate ability to maintain/progress functional gains from skilled physical therapy services.  Goal status: Met 02/28/24   3. Patient will demonstrate Patient specific functional scale  avg > or = 7 to indicate reduced disability due to condition.  Goal status: Met 02/14/24   4.  Patient will demonstrate Rt LE quad strength will increase by >/= 5 pounds throughout to faciltiate usual transfers, stairs, squatting at PLOF for daily life.  Goal status: met 02/28/24   5.  Patient will demonstrate up and  down 1 flight of stairs with single hand rail with pain </= 2/10.  Goal status: Met 02/14/24   6.  Pt will reporting return to dancing with pain </= 2/10.  Goal status:Met 02/28/24   PLAN:  PT FREQUENCY: 1-2x/week  PT DURATION: 10 weeks  PLANNED INTERVENTIONS: Can include 02853- PT Re-evaluation, 97110-Therapeutic exercises, 97530- Therapeutic activity, 97112- Neuromuscular re-education, 97535- Self Care, 97140- Manual therapy, [phone]- Gait training, [phone]- Orthotic Fit/training, [phone]- Canalith repositioning, [id number]- Aquatic Therapy, [phone]- Electrical stimulation (unattended), [id number] Physical performance testing, 97016- Vasopneumatic device, [id number]- Ultrasound, [id number]- Traction (mechanical), [id number]- Ionotophoresis 4mg /ml Dexamethasone ,  79439 - Needle insertion w/o injection 1 or 2 muscles, 20561 - Needle insertion w/o injection 3 or more muscles.   Patient/Family education, Balance training, Stair training, Taping, Dry Needling, Joint mobilization, Joint manipulation, Spinal manipulation, Spinal mobilization, Scar mobilization, Vestibular training, Visual/preceptual remediation/compensation, DME instructions, Cryotherapy, and Moist heat.  All performed as medically necessary.  All included unless contraindicated  PLAN FOR NEXT SESSION: discharge               Delon Lunger, PT, MPT 02/28/24 12:37 PM   02/28/24  12:37 PM      Date of referral: 11/29/23 Referring provider: Jerri Kay HERO, MD  Referring diagnosis?  M79.604 (ICD-10-CM) - Pain in right leg  M54.16 (ICD-10-CM) - Radiculopathy, lumbar region   Treatment diagnosis? (if different than referring diagnosis) M54.59, r26.2, m62.81, m79.604  What was this (referring dx) caused by? Arthritis  Nature of Condition: Initial Onset (within last 3 months)   Laterality: Rt  Current Functional Measure Score: Patient Specific Functional Scale 5  Objective measurements identify impairments when they are compared to normal  values, the uninvolved extremity, and prior level of function.  [x]  Yes  []  No  Objective assessment of functional ability: Minimal functional limitations   Briefly describe symptoms: pain, weakness and radiation down Rt LE  How did symptoms start: over time  Average pain intensity:  Last 24 hours: 6/10  Past week: 7/10  How often does the pt experience symptoms? Frequently  How much have the symptoms interfered with usual daily activities? Moderately  How has condition changed since care began at this facility? No change first treatment visit  In general, how is the patients overall health? Very Good   BACK PAIN (STarT Back Screening Tool) Has pain spread down the leg(s) at some time in the last 2 weeks? yes Has there been pain in the shoulder or neck at some time in the last 2 weeks? no Has the pt only walked short distances because of back pain? yes Has patient dressed more slowly because of back pain in the past 2 weeks? yes Does patient think it's not safe for a person with this condition to be physically active? no Does patient have worrying thoughts a lot of the time? yes Does patient feel back pain is terrible and will never get any better? no Has patient stopped enjoying things they usually enjoy? some  PHYSICAL THERAPY DISCHARGE SUMMARY  Visits from Start of Care: 13  Current functional level related to goals / functional outcomes: See above   Remaining  deficits: See above   Education / Equipment: HEP   Patient agrees to discharge. Patient goals were met. Patient is being discharged due to meeting the stated rehab goals.

## 2024-03-04 ENCOUNTER — Other Ambulatory Visit: Payer: Self-pay | Admitting: Family Medicine

## 2024-03-04 DIAGNOSIS — K219 Gastro-esophageal reflux disease without esophagitis: Secondary | ICD-10-CM

## 2024-03-06 ENCOUNTER — Other Ambulatory Visit: Payer: Self-pay | Admitting: Nurse Practitioner

## 2024-03-06 MED ORDER — MECLIZINE HCL 25 MG PO TABS: 25.0000 mg | ORAL_TABLET | Freq: Three times a day (TID) | ORAL | 0 refills | Status: DC | PRN

## 2024-03-06 NOTE — Telephone Encounter (Unsigned)
 Copied from CRM [id number]. Topic: Clinical - Medication Refill >> Mar 06, 2024 10:38 AM Rosina BIRCH wrote: Medication: meclizine   Has the patient contacted their pharmacy? yes (Agent: If no, request that the patient contact the pharmacy for the refill. If patient does not wish to contact the pharmacy document the reason why and proceed with request.) (Agent: If yes, when and what did the pharmacy advise?)  This is the patient's preferred pharmacy:   [facility] - [place], [place] - [address] [place] [address] Phone: [phone] Fax: [phone]  Is this the correct pharmacy for this prescription? Yes If no, delete pharmacy and type the correct one.   Has the prescription been filled recently? No  Is the patient out of the medication? {yes/no:20286}  Has the patient been seen for an appointment in the last year OR does the patient have an upcoming appointment? {yes/no:20286}  Can we respond through MyChart? Yes  Agent: Please be advised that Rx refills may take up to 3 business days. We ask that you follow-up with your pharmacy.

## 2024-03-13 ENCOUNTER — Ambulatory Visit: Admitting: Physician Assistant

## 2024-03-15 ENCOUNTER — Other Ambulatory Visit (INDEPENDENT_AMBULATORY_CARE_PROVIDER_SITE_OTHER): Payer: Self-pay

## 2024-03-15 ENCOUNTER — Ambulatory Visit: Admitting: Orthopaedic Surgery

## 2024-03-15 DIAGNOSIS — Z96652 Presence of left artificial knee joint: Secondary | ICD-10-CM | POA: Diagnosis not present

## 2024-03-15 NOTE — Progress Notes (Signed)
 Post-Op Visit Note   Patient: Nicholas Rasmussen           Date of Birth: [birth year]           MRN: [id number] Visit Date: 03/15/2024 PCP: Katheen Roselie Rockford, NP   Assessment & Plan:  Chief Complaint:  Chief Complaint  Patient presents with   Left Knee - Follow-up    Left TKA 02/28/2023   Visit Diagnoses:  1. Status post total left knee replacement     Plan: History of Present Illness Nicholas Rasmussen is a 78 year old male who presents for follow-up after left total knee replacement surgery.  He underwent knee replacement surgery approximately one year ago with a better-than-expected recovery. He is satisfied with the outcome and continues to engage in activities such as dancing, which was a primary goal for the surgery. He experiences occasional hesitation while dancing due to memories of his pre-surgery condition but performs activities without significant limitation.  Physical Exam MUSCULOSKELETAL: The knee scar is well-healed with excellent range of motion.  Results RADIOLOGY Knee X-ray: Prosthetic components well fixed, no evidence of displacement or loosening (03/15/2024)  Assessment and Plan Status post left total knee arthroplasty One year post-surgery with excellent recovery. X-rays show stable components. No functional limitations. Patient satisfied with outcome. - Schedule follow-up in one year. - Advise to call if any issues arise.  Follow-Up Instructions: Return in about 1 year (around 03/15/2025).   Orders:  Orders Placed This Encounter  Procedures   XR Knee 1-2 Views Left   No orders of the defined types were placed in this encounter.   Imaging: XR Knee 1-2 Views Left Result Date: 03/15/2024 X-rays of the left knee show a stable left total knee replacement in good alignment.    PMFS History: Patient Active Problem List   Diagnosis Date Noted   Hx of adenomatous colonic polyps 10/04/2023   Belching 08/26/2023   Status post total left knee  replacement 02/28/2023   Renal mass, right; Bosniak 4 complex cyst; 1 cm 09/09/2022   Acute medial meniscus tear of left knee 02/04/2022   Primary osteoarthritis of left knee 02/04/2022   Aortic root dilatation 12/18/2021   Slow transit constipation 12/08/2021   Left-sided chest wall pain 12/08/2021   Multiple pulmonary nodules 10/06/2021   Hyperlipidemia associated with type 2 diabetes mellitus (HCC) 08/19/2021   [name] 04/07/2021   Complex renal cyst 04/07/2021   Varicose veins of both lower extremities with pain 02/02/2019   Screening for colon cancer 09/19/2018   Type 2 diabetes mellitus with diabetic polyneuropathy, without long-term current use of insulin  (HCC) 03/29/2018   Diabetic neuropathy (HCC) 03/29/2018   Chronic pain of left knee 03/23/2018   Coronary artery disease involving native coronary artery of native heart with angina pectoris 08/03/2017   Post herpetic neuralgia 07/29/2016   Dizziness 01/02/2016   Chronic midline low back pain without sciatica 06/06/2015   Nonintractable episodic headache 03/13/2015   GERD (gastroesophageal reflux disease) 11/15/2014   Seizure disorder (HCC) 05/07/2014   Allergic rhinitis 05/07/2014   Past Medical History:  Diagnosis Date   Allergic rhinitis 05/07/2014   Arthritis    Diabetes mellitus without complication (HCC)    Dizziness 01/02/2016   GERD (gastroesophageal reflux disease)    Headache 03/13/2015   Hx of adenomatous colonic polyps 10/04/2023   Kidney lesion 07/22/2020   right, followed by urology   Left flank pain 01/02/2016   Low back pain 06/06/2015  Pain of left side of body 08/13/2015   Post herpetic neuralgia    Routine general medical examination at a health care facility 05/07/2014   Seizure disorder (HCC) 05/07/2014   Seizures (HCC)    Last seizure in the 70s.   Shingles    Stye 05/07/2014    Family History  Problem Relation Age of Onset   Other Mother         natural causes   Diabetes Father    Hypertension Father    Kidney cancer Brother    Colon cancer Neg Hx    Stomach cancer Neg Hx    Esophageal cancer Neg Hx    Colon polyps Neg Hx    Rectal cancer Neg Hx     Past Surgical History:  Procedure Laterality Date   COLONOSCOPY     KNEE SURGERY Left    left miniscus   TOTAL KNEE ARTHROPLASTY Left 02/28/2023   Procedure: TOTAL KNEE ARTHROPLASTY;  Surgeon: Jerri Kay HERO, MD;  Location: [facility] OR;  Service: Orthopedics;  Laterality: Left;   UPPER GASTROINTESTINAL ENDOSCOPY     uvala surgery     Social History   Occupational History   Occupation: Retired    Comment: Printmaker  Tobacco Use   Smoking status: Former    Current packs/day: 0.00    Types: Cigarettes    Quit date: 1992    Years since quitting: 33.8   Smokeless tobacco: Never  Vaping Use   Vaping status: Never Used  Substance and Sexual Activity   Alcohol use: Not Currently    Comment: occasional   Drug use: No   Sexual activity: Not on file

## 2024-03-20 ENCOUNTER — Ambulatory Visit (INDEPENDENT_AMBULATORY_CARE_PROVIDER_SITE_OTHER): Admitting: Nurse Practitioner

## 2024-03-20 ENCOUNTER — Telehealth: Payer: Self-pay | Admitting: Nurse Practitioner

## 2024-03-20 ENCOUNTER — Encounter: Payer: Self-pay | Admitting: Nurse Practitioner

## 2024-03-20 VITALS — BP 126/74 | HR 47 | Temp 97.2°F | Ht 76.0 in | Wt 252.8 lb

## 2024-03-20 DIAGNOSIS — J3489 Other specified disorders of nose and nasal sinuses: Secondary | ICD-10-CM | POA: Insufficient documentation

## 2024-03-20 DIAGNOSIS — E1142 Type 2 diabetes mellitus with diabetic polyneuropathy: Secondary | ICD-10-CM

## 2024-03-20 DIAGNOSIS — G40909 Epilepsy, unspecified, not intractable, without status epilepticus: Secondary | ICD-10-CM

## 2024-03-20 LAB — POCT GLUCOSE (DEVICE FOR HOME USE)
Glucose Fasting, POC: 105 mg/dL — AB (ref 70–99)
POC Glucose: 105 mg/dL — AB (ref 70–99)

## 2024-03-20 MED ORDER — BLOOD GLUCOSE TEST VI STRP: 1.0000 | ORAL_STRIP | Freq: Every day | 0 refills | Status: DC

## 2024-03-20 MED ORDER — BLOOD GLUCOSE MONITORING SUPPL DEVI: 1.0000 | Freq: Every day | 0 refills | Status: DC

## 2024-03-20 MED ORDER — PREDNISONE 20 MG PO TABS: 20.0000 mg | ORAL_TABLET | Freq: Every day | ORAL | 0 refills | Status: DC

## 2024-03-20 MED ORDER — LANCET DEVICE MISC: 1.0000 | Freq: Every day | 0 refills | Status: DC

## 2024-03-20 MED ORDER — LANCETS MISC: 1.0000 | Freq: Every day | 0 refills | Status: DC

## 2024-03-20 NOTE — Telephone Encounter (Signed)
 Copied from CRM [id number]. Topic: Clinical - Prescription Issue >> Mar 20, 2024 12:09 PM China J wrote: Reason for CRM: [facility] is unable to fill the medications that Tinnie Harada sent in today. The patient would like the medications to be sent to [facility] instead. He would like a phone call once that is completed.

## 2024-03-20 NOTE — Telephone Encounter (Signed)
 Noted

## 2024-03-20 NOTE — Telephone Encounter (Signed)
 I called and spoke with patient and he would like for all prescriptions that were prescribed today to be sent to [facility]. I told patient that I will send them to requested pharmacy for him.

## 2024-03-20 NOTE — Patient Instructions (Signed)
 It was great to see you!  Start flonase  daily   Start prednisone  1 tablet daily with food in the morning   Let's follow-up if your symptoms worsen or any concerns  Take care,  Tinnie Harada, NP

## 2024-03-20 NOTE — Assessment & Plan Note (Signed)
 Symptoms align with sinus pain, presenting with headache and right ear issues. Prescribe prednisone  20mg , one tablet daily with food in the morning. Restart Flonase  nasal spray, discontinuing if symptoms worsen.

## 2024-03-20 NOTE — Progress Notes (Signed)
 Acute Office Visit  Subjective:     Patient ID: Nicholas Rasmussen, male    DOB: [birth year], 78 y.o.   MRN: [id number]  Chief Complaint  Patient presents with   Headache    With dizziness for 1 week, right ear seems clogged    HPI Discussed the use of AI scribe software for clinical note transcription with the patient, who gave verbal consent to proceed.  History of Present Illness   Nicholas Rasmussen is a 78 year old male who presents with headache and dizziness.  He experiences a 'halfway headache' or 'fog' since October 13th, primarily over his right eye with varying intensity. Two Tylenol  provided partial relief. Dizziness is present, described as general rather than vertiginous, and is sometimes induced by washing his hair. He feels fatigued and tired. No recent seizures, slurred speech, or confusion.  He reports a sensation of his right ear feeling 'clogged.' He stopped using Flonase  nasal spray, suspecting it caused headaches, but restarted it recently and has not used it in the past week. No nasal congestion, sore throat, or neck pain.  He has not checked his blood sugar recently and lacks a home meter. He takes Depakote  and has been seizure-free since the 1970s.     ROS See pertinent positives and negatives per HPI.     Objective:    BP 126/74 (BP Location: Left Arm, Patient Position: Sitting, Cuff Size: Large)   Pulse (!) 47   Temp (!) 97.2 F (36.2 C)   Ht 6' 4 (1.93 m)   Wt 252 lb 12.8 oz (114.7 kg)   SpO2 98%   BMI 30.77 kg/m    Physical Exam Vitals and nursing note reviewed.  Constitutional:      Appearance: Normal appearance.  HENT:     Head: Normocephalic.     Right Ear: Tympanic membrane, ear canal and external ear normal.     Left Ear: Tympanic membrane, ear canal and external ear normal.     Nose:     Right Sinus: No maxillary sinus tenderness or frontal sinus tenderness.     Left Sinus: No maxillary sinus tenderness or frontal sinus tenderness.      Mouth/Throat:     Mouth: Mucous membranes are moist.     Pharynx: No posterior oropharyngeal erythema.  Eyes:     Extraocular Movements: Extraocular movements intact.     Conjunctiva/sclera: Conjunctivae normal.     Pupils: Pupils are equal, round, and reactive to light.  Cardiovascular:     Rate and Rhythm: Normal rate and regular rhythm.     Pulses: Normal pulses.     Heart sounds: Normal heart sounds.  Pulmonary:     Effort: Pulmonary effort is normal.     Breath sounds: Normal breath sounds.  Musculoskeletal:     Cervical back: Normal range of motion and neck supple. No tenderness.  Lymphadenopathy:     Cervical: No cervical adenopathy.  Skin:    General: Skin is warm.  Neurological:     General: No focal deficit present.     Mental Status: He is alert and oriented to person, place, and time.     Comments: Dix-hallpike maneuver negative bilaterally   Psychiatric:        Mood and Affect: Mood normal.        Behavior: Behavior normal.        Thought Content: Thought content normal.        Judgment: Judgment normal.     Results  for orders placed or performed in visit on 03/20/24  POCT Glucose (Device for Home Use)  Result Value Ref Range   Glucose Fasting, POC 105 (A) 70 - 99 mg/dL   POC Glucose 894 (A) 70 - 99 mg/dl        Assessment & Plan:   Problem List Items Addressed This Visit       Endocrine   Type 2 diabetes mellitus with diabetic polyneuropathy, without long-term current use of insulin  (HCC)   Glucose level today in office was 105. Will prescribe glucometer to check his sugars daily.       Relevant Orders   POCT Glucose (Device for Home Use) (Completed)     Nervous and Auditory   Seizure disorder (HCC)   Chronic seizure disorder is well-controlled with Depakote . No seizures have been reported since the 1970s.         Other   Sinus pain - Primary   Symptoms align with sinus pain, presenting with headache and right ear issues. Prescribe  prednisone  20mg , one tablet daily with food in the morning. Restart Flonase  nasal spray, discontinuing if symptoms worsen.      Relevant Orders   POCT Glucose (Device for Home Use) (Completed)   Meds ordered this encounter  Medications   DISCONTD: predniSONE  (DELTASONE ) 20 MG tablet    Sig: Take 1 tablet (20 mg total) by mouth daily with breakfast.    Dispense:  7 tablet    Refill:  0   DISCONTD: Blood Glucose Monitoring Suppl DEVI    Sig: 1 each by Does not apply route daily. Dispense based on patient and insurance preference.    Dispense:  1 each    Refill:  0   DISCONTD: Glucose Blood (BLOOD GLUCOSE TEST STRIPS) STRP    Sig: 1 each by Does not apply route daily. Dispense based on patient and insurance preference.    Dispense:  100 strip    Refill:  0   DISCONTD: Lancet Device MISC    Sig: 1 each by Does not apply route daily at 6 (six) AM. Dispense based on patient and insurance preference.    Dispense:  1 each    Refill:  0   DISCONTD: Lancets MISC    Sig: 1 each by Does not apply route daily. Dispense based on patient and insurance preference.    Dispense:  100 each    Refill:  0    Return if symptoms worsen or fail to improve.  Tinnie DELENA Harada, NP

## 2024-03-20 NOTE — Assessment & Plan Note (Signed)
 Glucose level today in office was 105. Will prescribe glucometer to check his sugars daily.

## 2024-03-20 NOTE — Assessment & Plan Note (Addendum)
 Chronic seizure disorder is well-controlled with Depakote . No seizures have been reported since the 1970s.

## 2024-03-20 NOTE — Addendum Note (Signed)
 Addended by: GLADIS CLAUDENE GRATE Y on: 03/20/2024 01:45 PM   Modules accepted: Orders

## 2024-03-26 ENCOUNTER — Encounter: Payer: Self-pay | Admitting: Radiology

## 2024-03-26 NOTE — Progress Notes (Signed)
   03/26/2024  Patient ID: Nicholas Rasmussen, male   DOB: 10/15/1945, 78 y.o.   MRN: [id number]  This patient is appearing on a report for being at risk of failing the adherence measure for cholesterol (statin) medications this calendar year.   Medication: atorvastatin  40mg  Last fill date: 10/31 for 30 day supply  Insurance report was not up to date. No action needed at this time.   Channing DELENA Mealing, PharmD, DPLA

## 2024-04-04 ENCOUNTER — Ambulatory Visit (INDEPENDENT_AMBULATORY_CARE_PROVIDER_SITE_OTHER): Admitting: Otolaryngology

## 2024-04-10 ENCOUNTER — Telehealth: Payer: Self-pay | Admitting: Nurse Practitioner

## 2024-04-10 DIAGNOSIS — E782 Mixed hyperlipidemia: Secondary | ICD-10-CM

## 2024-04-10 NOTE — Progress Notes (Signed)
 Pharmacy Quality Measure Review  This patient is appearing on a report for being at risk of failing the adherence measure for cholesterol (statin) medications this calendar year.   Medication: Atorvastatin  40mg  Last fill date: 10/31 for 30 day supply  Talked with patient, states he is adherent. Does not need additional refills at this time. All questions by the patient were answered.  Angela Baalmann, PharmD [facility] Pharmacist

## 2024-05-02 ENCOUNTER — Ambulatory Visit: Payer: Self-pay

## 2024-05-02 NOTE — Telephone Encounter (Signed)
 FYI Only or Action Required?: Action required by provider: update on patient condition.  Patient was last seen in primary care on 03/20/2024 by Nedra Tinnie LABOR, NP.  Called Nurse Triage reporting Cough.  Symptoms began several days ago.  Interventions attempted: OTC medications: mucinex .  Symptoms are: gradually improving.  Triage Disposition: Home Care  Patient/caregiver understands and will follow disposition?: Yes

## 2024-05-02 NOTE — Telephone Encounter (Signed)
° °  Reason for Triage: Patient has sore throat and chest congestion, wants to know what medication he can take? Please call [phone] (M)  Reason for Disposition  Cough  Answer Assessment - Initial Assessment Questions Pt states he has mild chest congestion and cough with green phlegm, you know from a cold. Pt denies any headache, runny nose, earache, chest pain, shortness of breath, fever or other high acuity questions. Pt states he did have a sore throat but did a salt water gargle this am and that helped. Feels like the cough stuff is breaking up. RN did give care advise for at home, which medications are safe with high BP, and when to call back. Pt stated understanding.      1. SEVERITY: How bad is the cough today?      mild 2. SPUTUM: Describe the color of your sputum (e.g., none, dry cough; clear, white, yellow, green)     green 3 HEMOPTYSIS: Are you coughing up any blood? If Yes, ask: How much? (e.g., flecks, streaks, tablespoons, etc.)     no 4. DIFFICULTY BREATHING: Are you having difficulty breathing? If Yes, ask: How bad is it? (e.g., mild, moderate, severe)      no 5. FEVER: Do you have a fever? If Yes, ask: What is your temperature, how was it measured, and when did it start?     denies 6. CARDIAC HISTORY: Do you have any history of heart disease? (e.g., heart attack, congestive heart failure)      cad 7. LUNG HISTORY: Do you have any history of lung disease?  (e.g., pulmonary embolus, asthma, emphysema)     no 8. OTHER SYMPTOMS: Do you have any other symptoms? (e.g., runny nose, wheezing, chest pain)       Runny nose  Protocols used: Cough - Acute Productive-A-AH

## 2024-05-07 ENCOUNTER — Ambulatory Visit (INDEPENDENT_AMBULATORY_CARE_PROVIDER_SITE_OTHER): Admitting: Emergency Medicine

## 2024-05-07 ENCOUNTER — Encounter: Payer: Self-pay | Admitting: Emergency Medicine

## 2024-05-07 ENCOUNTER — Ambulatory Visit: Payer: Self-pay

## 2024-05-07 VITALS — BP 116/76 | HR 45 | Temp 98.3°F | Resp 16 | Ht 76.0 in | Wt 251.0 lb

## 2024-05-07 DIAGNOSIS — J0110 Acute frontal sinusitis, unspecified: Secondary | ICD-10-CM

## 2024-05-07 MED ORDER — DOXYCYCLINE HYCLATE 100 MG PO TABS: 100.0000 mg | ORAL_TABLET | Freq: Two times a day (BID) | ORAL | 0 refills | Status: AC

## 2024-05-07 MED ORDER — CETIRIZINE HCL 10 MG PO TABS: 10.0000 mg | ORAL_TABLET | Freq: Every day | ORAL | 0 refills | Status: DC

## 2024-05-07 NOTE — Progress Notes (Signed)
 Assessment & Plan:   Assessment & Plan Acute non-recurrent frontal sinusitis Rx as below Also recommend saline gargles, daily antihistamine Recheck prn for new concerns Orders:   doxycycline  (VIBRA -TABS) 100 MG tablet; Take 1 tablet (100 mg total) by mouth 2 (two) times daily for 7 days. Take with a full glass of water. Avoid direct sunlight while taking.       Nicholas Rasmussen, MSPAS, PA-C   Subjective:  Facial Pain (Facial pressure, congestion, runny nose x 1 week. )   HPI: Nicholas Rasmussen is a 78 y.o. male presenting with sinus pressure behind the nose for about a week, associated with intermitted headache worse at night. In the morning, blood-tinged mucous clears and he feels a little better temporarily. Mostly dry cough though feels like perhaps there is mucous he needs to clear all the time and feels improved when he is finally able to get it out when a big coughing fit. Denies fever, chills, chest pain, SOB.  Cannot tolerate steroid nasal sprays due to headaches- tried after his last visit here for similar Wife had something similar and sought evaluation in person, was rx antibiotic.   He does not smoke.    ROS: Negative unless specifically indicated above in HPI.   Relevant past medical history reviewed and updated as indicated.   Allergies and medications reviewed and updated.  Current Medications[1]  Allergies[2]    Objective:   Vitals:   05/07/24 1356  BP: 116/76  Pulse: (!) 45  Temp: 98.3 F (36.8 C)  Resp: 16  Height: 6' 4 (1.93 m)  Weight: 251 lb (113.9 kg)  SpO2: 98%  BMI (Calculated): 30.57      Gen: appears well, INAD, alert, INAD Ears: RTM injection serous effusion. LTM retracted. Canals clear. Nares mucosal edema with purulent mucous noted in each nare. MMM cobblestone OP.  Neck: supple and no adenopathy Heart bradycardia with regular rhythm Lungs: Respiratory effort: normal.  clear to auscultation, no wheezes, rales, or  rhonchi Skin: Warm and dry without acute rash to exposed areas.           [1]  Current Outpatient Medications:    acyclovir  ointment (ZOVIRAX ) 5 %, Apply 1 Application topically every 6 (six) hours as needed., Disp: 30 g, Rfl: 0   atorvastatin  (LIPITOR) 40 MG tablet, Take 1 tablet (40 mg total) by mouth every evening., Disp: 90 tablet, Rfl: 2   Blood Glucose Monitoring Suppl DEVI, 1 each by Does not apply route daily. Dispense based on patient and insurance preference., Disp: 1 each, Rfl: 0   cetirizine  (ZYRTEC ) 10 MG tablet, Take 1 tablet (10 mg total) by mouth daily., Disp: 90 tablet, Rfl: 0   Cholecalciferol (VITAMIN D-3) 125 MCG (5000 UT) TABS, Take 5,000 Units by mouth daily., Disp: , Rfl:    Collagen-Vitamin C-Biotin (COLLAGEN PO), Take 1 Dose by mouth daily., Disp: , Rfl:    divalproex  (DEPAKOTE  ER) 500 MG 24 hr tablet, Take 1 tablet (500 mg total) by mouth at bedtime., Disp: 90 tablet, Rfl: 3   doxycycline  (VIBRA -TABS) 100 MG tablet, Take 1 tablet (100 mg total) by mouth 2 (two) times daily for 7 days. Take with a full glass of water. Avoid direct sunlight while taking., Disp: 14 tablet, Rfl: 0   esomeprazole  (NEXIUM ) 40 MG capsule, TAKE 1 CAPSULE (40 MG TOTAL) BY MOUTH DAILY BEFORE BREAKFAST. TAKE 30-60 MIN BEFORE EATING, Disp: 90 capsule, Rfl: 1   gabapentin  (NEURONTIN ) 300 MG capsule, TAKE 1 CAPSULE BY MOUTH  EVERYDAY AT BEDTIME, Disp: 90 capsule, Rfl: 3   glipiZIDE  (GLUCOTROL  XL) 5 MG 24 hr tablet, TAKE 1 TABLET BY MOUTH EVERY DAY WITH BREAKFAST, Disp: 90 tablet, Rfl: 3   Glucose Blood (BLOOD GLUCOSE TEST STRIPS) STRP, 1 each by Does not apply route daily. Dispense based on patient and insurance preference., Disp: 100 strip, Rfl: 0   Lancet Device MISC, 1 each by Does not apply route daily at 6 (six) AM. Dispense based on patient and insurance preference., Disp: 1 each, Rfl: 0   Lancets MISC, 1 each by Does not apply route daily. Dispense based on patient and insurance  preference., Disp: 100 each, Rfl: 0   metoprolol  tartrate (LOPRESSOR ) 25 MG tablet, TAKE 1/2 TABLET BY MOUTH EVERY DAY, Disp: 45 tablet, Rfl: 1   nitroGLYCERIN  (NITROSTAT ) 0.4 MG SL tablet, Place 1 tablet (0.4 mg total) under the tongue every 5 (five) minutes as needed for chest pain., Disp: 30 tablet, Rfl: 0   OVER THE COUNTER MEDICATION, Multi collagen 1 scoop in the am, Disp: , Rfl:    Probiotic Product (PROBIOTIC PO), Take 1 tablet by mouth daily., Disp: , Rfl:  [2]  Allergies Allergen Reactions   Jardiance  [Empagliflozin ] Other (See Comments)    Yeast infection

## 2024-05-07 NOTE — Telephone Encounter (Signed)
 FYI Only or Action Required?: FYI only for provider: appointment scheduled on 12/15.  Patient was last seen in primary care on 03/20/2024 by Nedra Tinnie LABOR, NP.  Called Nurse Triage reporting Sinusitis.  Symptoms began several days ago.  Interventions attempted: Nothing.  Symptoms are: unchanged.  Triage Disposition: See PCP When Office is Open (Within 3 Days)  Patient/caregiver understands and will follow disposition?: Yes                 Copied from CRM #[id number]. Topic: Clinical - Red Word Triage >> May 07, 2024 10:21 AM Eva FALCON wrote: Red Word that prompted transfer to Nurse Triage: sinus headaches, sinus drainage, blood when he blows his nose. Reason for Disposition  [1] Sinus congestion (pressure, fullness) AND [2] present > 10 days    X 4-5 days  Answer Assessment - Initial Assessment Questions 1. LOCATION: Where does it hurt?      Center of head  2. ONSET: When did the sinus pain start?  (e.g., hours, days)      X 4-5 days  3. SEVERITY: How bad is the pain?   (Scale 0-10; or none, mild, moderate or severe)     5/10  4. RECURRENT SYMPTOM: Have you ever had sinus problems before? If Yes, ask: When was the last time? and What happened that time?      No   5. NASAL CONGESTION: Is the nose blocked? If Yes, ask: Can you open it or must you breathe through your mouth?     Runny nose, blood noted when blowing nose   6. NASAL DISCHARGE: Do you have discharge from your nose? If so ask, What color?     Green    7. FEVER: Do you have a fever? If Yes, ask: What is it, how was it measured, and when did it start?      No   8. OTHER SYMPTOMS: Do you have any other symptoms? (e.g., sore throat, cough, earache, difficulty breathing)   Dry cough, feels like something is in his right ear. Was seen by ENT for the clogged ear.    Patient called in to triage with complaints of Sinus pain, congestion, runny nose.  This has been  ongoing for 4-5 days. The patient stated he cannot use the nasal spray as it causes a headache. For home care, the patient is not taking any OTC medication.   Appointment scheduled for further evaluation; and agrees with the plan of care, and will reach out if symptoms worsen or persist.  Protocols used: Sinus Pain or Congestion-A-AH

## 2024-05-08 ENCOUNTER — Other Ambulatory Visit (HOSPITAL_COMMUNITY)
Admission: RE | Admit: 2024-05-08 | Discharge: 2024-05-08 | Disposition: A | Source: Ambulatory Visit | Attending: Otolaryngology | Admitting: Otolaryngology

## 2024-05-08 ENCOUNTER — Encounter (INDEPENDENT_AMBULATORY_CARE_PROVIDER_SITE_OTHER): Payer: Self-pay | Admitting: Otolaryngology

## 2024-05-08 ENCOUNTER — Ambulatory Visit (INDEPENDENT_AMBULATORY_CARE_PROVIDER_SITE_OTHER): Admitting: Otolaryngology

## 2024-05-08 VITALS — BP 145/81 | HR 53 | Ht 76.0 in | Wt 251.0 lb

## 2024-05-08 DIAGNOSIS — J3489 Other specified disorders of nose and nasal sinuses: Secondary | ICD-10-CM

## 2024-05-08 MED ORDER — MUPIROCIN 2 % EX OINT: 1.0000 | TOPICAL_OINTMENT | Freq: Two times a day (BID) | CUTANEOUS | 0 refills | Status: AC

## 2024-05-08 NOTE — Progress Notes (Unsigned)
 Procedure Note - Excision of intranasal lesion    Name: Nicholas Rasmussen MRN: [id number] Date: 05/11/2024    Pre-procedure diagnosis: Left Nasal Lesion Post-procedure diagnosis: Same Procedure: Excision of Left Intranasal Lesion  (CPT 30117-LT) Complications: None apparent EBL: 10 mL Indication: Left Internal Nasal Lesion   Risks and benefits of biopsy were explained to the patient including recurrence, discovery of malignancy, need for further procedures, pain, bleeding, infection, and nasal obstruction. Written Consent was obtained.    The patient was identified as the correct patient.  The area surrounding the lesion was injected 1% Lidocaine  with 1:100,000 epinephrine  and allowed to work. The lesion was noted to be just inside the nasal sill intranasally on the junction of the internal caudal septum and nasal floor. The periphery of the lesion was marked, and a small ellipse was drawn out. A 15 blade was used to make incision through the skin and mucosa. A subcutaneous plane was developed using sharp curved scissors and the lesion was excised en bloc and sent for pathologic examination. The lesion did not involve the cartilage or perichondrium. Hemostasis was achieved and the lesion was closed with two interrupted 5-0 fast gut sutures. Mupirocin  ointment was applied. The patient tolerated the procedure well.    Plan:  - Take tylenol  and ibuprofen  as needed for pain - Mupirocin  ointment BID x7d to the incision - Will call with results  Jasira Robinson B Orenthal Debski

## 2024-05-10 LAB — SURGICAL PATHOLOGY

## 2024-05-11 ENCOUNTER — Ambulatory Visit (INDEPENDENT_AMBULATORY_CARE_PROVIDER_SITE_OTHER): Payer: Self-pay

## 2024-05-11 NOTE — Telephone Encounter (Signed)
 Left a voicemail informing patient to contact office regarding results.

## 2024-05-30 ENCOUNTER — Other Ambulatory Visit: Payer: Self-pay | Admitting: Family

## 2024-05-30 ENCOUNTER — Telehealth: Payer: Self-pay

## 2024-05-30 DIAGNOSIS — E1169 Type 2 diabetes mellitus with other specified complication: Secondary | ICD-10-CM

## 2024-05-30 DIAGNOSIS — B0229 Other postherpetic nervous system involvement: Secondary | ICD-10-CM

## 2024-05-30 DIAGNOSIS — R238 Other skin changes: Secondary | ICD-10-CM

## 2024-05-30 DIAGNOSIS — E1142 Type 2 diabetes mellitus with diabetic polyneuropathy: Secondary | ICD-10-CM

## 2024-05-30 DIAGNOSIS — I25119 Atherosclerotic heart disease of native coronary artery with unspecified angina pectoris: Secondary | ICD-10-CM

## 2024-05-30 DIAGNOSIS — K219 Gastro-esophageal reflux disease without esophagitis: Secondary | ICD-10-CM

## 2024-05-30 DIAGNOSIS — G40909 Epilepsy, unspecified, not intractable, without status epilepticus: Secondary | ICD-10-CM

## 2024-05-30 MED ORDER — OMEPRAZOLE 40 MG PO CPDR: 40.0000 mg | DELAYED_RELEASE_CAPSULE | Freq: Every day | ORAL | 3 refills | Status: DC

## 2024-05-30 NOTE — Telephone Encounter (Signed)
 Copied from CRM [id number]. Topic: Clinical - Prescription Issue >> May 30, 2024 11:52 AM [name] wrote: Reason for CRM:  Patient had a prescription that he was just put on but it cost $47 and he can not afford it.  esomeprazole  (NEXIUM ) 40 MG capsule  Patient is asking for an alternative thats affordable or if he can just be taken off of it.   [facility] - [place], [place] - [address] [place] [address] Phone: [phone] Fax: [phone] Hours: Not open 24 hours

## 2024-05-31 NOTE — Telephone Encounter (Signed)
 Called patient and made aware of medication change of Omeprazole  (Prilosec) 40 mg was sent to his pharmacy. Patient stated that the Omeprazole  did not work for hime that is why he was switched to Esomperazole (Nexium ) that we can look in his chart and see that it did not work. I advised him that I will send it back for review and give him a call back. He thanked me for calling

## 2024-06-01 NOTE — Telephone Encounter (Signed)
 Called and left a voice message per DPR asking to give me a call back at the office at [phone]

## 2024-06-05 NOTE — Telephone Encounter (Signed)
 Called patient's pharmacy and informed why I was calling. Pharmacy staff stated that the copay is $47 and that he may want to call his insurance to ask what they cover. She stated that their system is showing pantoprazole  (Protonix )  a covered but to still have patient call due to all the changes most insurance companies are doing. I thanked her for taking my call.

## 2024-06-05 NOTE — Telephone Encounter (Signed)
 Called patient and informed him of Padonda Webb's comments about the omeprazole  and what the pharmacy said about pantoprazole  (protonix ). I also informed him to contact his insurance to ask what is covered at little to no cost for him. He thanked me for calling

## 2024-06-06 MED ORDER — PANTOPRAZOLE SODIUM 40 MG PO TBEC: 40.0000 mg | DELAYED_RELEASE_TABLET | Freq: Every day | ORAL | 1 refills | Status: AC

## 2024-06-06 NOTE — Addendum Note (Signed)
 Addended by: KATHEEN GARDEN L on: 06/06/2024 12:40 PM   Modules accepted: Orders

## 2024-06-07 ENCOUNTER — Telehealth: Payer: Self-pay | Admitting: Nurse Practitioner

## 2024-06-07 NOTE — Telephone Encounter (Signed)
 Patient requesting medication not on profile: Copied from CRM #[id number]. Topic: Clinical - Medication Refill >> Jun 07, 2024  3:25 PM Berneda F wrote: Medication: esomeprazole  (NEXIUM ) 40 MG capsule meclizine  (ANTIVERT ) 25 MG tablet

## 2024-06-07 NOTE — Telephone Encounter (Signed)
 Copied from CRM #[id number]. Topic: Clinical - Medication Refill >> Jun 07, 2024  3:25 PM Berneda F wrote: Medication: esomeprazole  (NEXIUM ) 40 MG capsule meclizine  (ANTIVERT ) 25 MG tablet  These are both showing as discontinued, but pharmacy is requesting it  Has the patient contacted their pharmacy? Yes (Agent: If no, request that the patient contact the pharmacy for the refill. If patient does not wish to contact the pharmacy document the reason why and proceed with request.) (Agent: If yes, when and what did the pharmacy advise?)  This is the patient's preferred pharmacy:   [facility] - [place], [place] - [address] [place] [address] Phone: [phone] Fax: [phone]  Is this the correct pharmacy for this prescription? Yes If no, delete pharmacy and type the correct one.   Has the prescription been filled recently? No  Is the patient out of the medication? Yes  Has the patient been seen for an appointment in the last year OR does the patient have an upcoming appointment? Yes  Can we respond through MyChart? Yes  Agent: Please be advised that Rx refills may take up to 3 business days. We ask that you follow-up with your pharmacy.

## 2024-06-07 NOTE — Telephone Encounter (Signed)
 Patient was called and informed that PCP sent pantoprazole  40 mg was sent to [facility]. He thanked me for letting him know because that is covered under his insurance. I asked him about the Meclizine  and that it was not on his current medication list  and has not been refilled by [facility] since 2022. He stated that he does not need the as needed medication

## 2024-06-22 ENCOUNTER — Emergency Department (HOSPITAL_COMMUNITY)
Admission: EM | Admit: 2024-06-22 | Discharge: 2024-06-22 | Disposition: A | Discharge status: Home / Self Care | Attending: Emergency Medicine | Admitting: Emergency Medicine

## 2024-06-22 ENCOUNTER — Emergency Department (HOSPITAL_COMMUNITY)

## 2024-06-22 ENCOUNTER — Other Ambulatory Visit: Payer: Self-pay

## 2024-06-22 ENCOUNTER — Telehealth: Payer: Self-pay

## 2024-06-22 DIAGNOSIS — Z7982 Long term (current) use of aspirin: Secondary | ICD-10-CM | POA: Insufficient documentation

## 2024-06-22 DIAGNOSIS — K219 Gastro-esophageal reflux disease without esophagitis: Secondary | ICD-10-CM

## 2024-06-22 DIAGNOSIS — K529 Noninfective gastroenteritis and colitis, unspecified: Secondary | ICD-10-CM | POA: Insufficient documentation

## 2024-06-22 DIAGNOSIS — Z79899 Other long term (current) drug therapy: Secondary | ICD-10-CM | POA: Insufficient documentation

## 2024-06-22 DIAGNOSIS — I2510 Atherosclerotic heart disease of native coronary artery without angina pectoris: Secondary | ICD-10-CM | POA: Insufficient documentation

## 2024-06-22 DIAGNOSIS — E119 Type 2 diabetes mellitus without complications: Secondary | ICD-10-CM | POA: Insufficient documentation

## 2024-06-22 LAB — HEPATIC FUNCTION PANEL
ALT: <5 U/L (ref 0–44)
AST: 21 U/L (ref 15–41)
Albumin: 4.4 g/dL (ref 3.5–5.0)
Alkaline Phosphatase: 73 U/L (ref 38–126)
Bilirubin, Direct: <0.1 mg/dL (ref 0.0–0.2)
Total Bilirubin: 0.4 mg/dL (ref 0.0–1.2)
Total Protein: 7.9 g/dL (ref 6.5–8.1)

## 2024-06-22 LAB — TROPONIN T, HIGH SENSITIVITY
Troponin T High Sensitivity: 12 ng/L (ref 0–19)
Troponin T High Sensitivity: 11 ng/L (ref 0–19)

## 2024-06-22 LAB — CBC
HCT: 40 % (ref 39.0–52.0)
Hemoglobin: 12.8 g/dL — ABNORMAL LOW (ref 13.0–17.0)
MCH: 26.8 pg (ref 26.0–34.0)
MCHC: 32 g/dL (ref 30.0–36.0)
MCV: 83.7 fL (ref 80.0–100.0)
Platelets: 227 10*3/uL (ref 150–400)
RBC: 4.78 MIL/uL (ref 4.22–5.81)
RDW: 14.4 % (ref 11.5–15.5)
WBC: 6.3 10*3/uL (ref 4.0–10.5)
nRBC: 0 % (ref 0.0–0.2)

## 2024-06-22 LAB — BASIC METABOLIC PANEL WITH GFR
Anion gap: 9 (ref 5–15)
BUN: 27 mg/dL — ABNORMAL HIGH (ref 8–23)
CO2: 31 mmol/L (ref 22–32)
Calcium: 9.8 mg/dL (ref 8.9–10.3)
Chloride: 102 mmol/L (ref 98–111)
Creatinine, Ser: 1.29 mg/dL — ABNORMAL HIGH (ref 0.61–1.24)
GFR, Estimated: 57 mL/min — ABNORMAL LOW
Glucose, Bld: 137 mg/dL — ABNORMAL HIGH (ref 70–99)
Potassium: 4.4 mmol/L (ref 3.5–5.1)
Sodium: 141 mmol/L (ref 135–145)

## 2024-06-22 LAB — LIPASE, BLOOD: Lipase: 30 U/L (ref 11–51)

## 2024-06-22 MED ORDER — PANTOPRAZOLE SODIUM 40 MG PO TBEC
40.0000 mg | DELAYED_RELEASE_TABLET | Freq: Every day | ORAL | Status: DC
  Administered 2024-06-22: 40 mg via ORAL
  Filled 2024-06-22: qty 1

## 2024-06-22 MED ORDER — ALUM & MAG HYDROXIDE-SIMETH 200-200-20 MG/5ML PO SUSP
30.0000 mL | Freq: Once | ORAL | Status: AC
  Administered 2024-06-22: 30 mL via ORAL
  Filled 2024-06-22: qty 30

## 2024-06-22 MED ORDER — LIDOCAINE VISCOUS HCL 2 % MT SOLN
15.0000 mL | Freq: Once | OROMUCOSAL | Status: AC
  Administered 2024-06-22: 15 mL via OROMUCOSAL
  Filled 2024-06-22: qty 15

## 2024-06-22 NOTE — Telephone Encounter (Signed)
 Copied from CRM #[id number]. Topic: Clinical - Medication Refill >> Jun 22, 2024 12:01 PM Macario HERO wrote: Medication: esomeprazole  (NEXIUM ) 40 MG capsule [[id number]]  Has the patient contacted their pharmacy? Yes (Agent: If no, request that the patient contact the pharmacy for the refill. If patient does not wish to contact the pharmacy document the reason why and proceed with request.) (Agent: If yes, when and what did the pharmacy advise?)  This is the patient's preferred pharmacy:   [facility] - [place], [place] - [address] [place] [address] Phone: [phone] Fax: [phone]    Is this the correct pharmacy for this prescription? Yes If no, delete pharmacy and type the correct one.   Has the prescription been filled recently? No  Is the patient out of the medication? No  Has the patient been seen for an appointment in the last year OR does the patient have an upcoming appointment? Yes  Can we respond through MyChart? Yes  Agent: Please be advised that Rx refills may take up to 3 business days. We ask that you follow-up with your pharmacy.

## 2024-06-22 NOTE — ED Triage Notes (Addendum)
 Pt BIB [facility] from home c/o chest pain. Pt woke up around 0330 epigastric/midsternal burning pain that worsens with palpation. No vomiting/SOB/dizziness. Pt took 4 nitroglycerin , omeprazole , and 324mg  Aspirin . Pt now reports no pain. Pt has nitros from an episode of chest pain last year.  Pt states he burps a lot and that he often has to sit up after he eats or else the burping gets worse. He does not some burning at times in his esophagus. He endorses eating fried fish yesterday evening for dinner.  HR 52 (takes beta blocker) BP 133/75 100% RA A/Ox4

## 2024-06-22 NOTE — Telephone Encounter (Signed)
 Patient has the Pantoprazole  and doesn't need anything. Dm/cma

## 2024-06-22 NOTE — ED Provider Notes (Signed)
 " [facility] Provider Note   CSN: [id number] Arrival date & time: 06/22/24  9288     Patient presents with: Chest Pain   Nicholas Rasmussen is a 79 y.o. male.  {Add pertinent medical, surgical, social history, OB history to HPI:32947}  Chest Pain      Prior to Admission medications  Medication Sig Start Date End Date Taking? Authorizing Provider  acyclovir  ointment (ZOVIRAX ) 5 % Apply 1 Application topically every 6 (six) hours as needed. 08/01/23  Yes Nche, Roselie Rockford, NP  aspirin  EC 81 MG tablet Take 81 mg by mouth daily. Swallow whole.   Yes [provider]  atorvastatin  (LIPITOR) 40 MG tablet Take 1 tablet (40 mg total) by mouth every evening. 07/22/23  Yes Williams, Evan, PA-C  Cholecalciferol (VITAMIN D-3) 125 MCG (5000 UT) TABS Take 5,000 Units by mouth daily.   Yes [provider]  Collagen-Vitamin C-Biotin (COLLAGEN PO) Take 1 Dose by mouth daily.   Yes [provider]  divalproex  (DEPAKOTE  ER) 500 MG 24 hr tablet Take 1 tablet (500 mg total) by mouth at bedtime. 01/04/24  Yes Nche, Roselie Rockford, NP  esomeprazole  (NEXIUM ) 40 MG capsule Take 40 mg by mouth daily at 12 noon.   Yes [provider]  gabapentin  (NEURONTIN ) 300 MG capsule TAKE 1 CAPSULE BY MOUTH EVERYDAY AT BEDTIME 01/04/24  Yes Nche, Charlotte Lum, NP  glipiZIDE  (GLUCOTROL  XL) 5 MG 24 hr tablet TAKE 1 TABLET BY MOUTH EVERY DAY WITH BREAKFAST 01/04/24  Yes Nche, Charlotte Lum, NP  metoprolol  tartrate (LOPRESSOR ) 25 MG tablet TAKE 1/2 TABLET BY MOUTH EVERY DAY 01/16/24  Yes Nche, Roselie Rockford, NP  nitroGLYCERIN  (NITROSTAT ) 0.4 MG SL tablet Place 1 tablet (0.4 mg total) under the tongue every 5 (five) minutes as needed for chest pain. 07/22/23  Yes Trudy Birmingham, PA-C  Probiotic Product (PROBIOTIC PO) Take 1 tablet by mouth daily.   Yes [provider]  pantoprazole  (PROTONIX ) 40 MG tablet Take 1 tablet (40 mg total) by mouth  daily. Patient not taking: Reported on 06/22/2024 06/06/24   Nche, Roselie Rockford, NP    Allergies: Jardiance  [empagliflozin ]    Review of Systems  Cardiovascular:  Positive for chest pain.    Updated Vital Signs BP 125/80   Pulse (!) 52   Temp 97.6 F (36.4 C) (Oral)   Resp 11   Ht 6' 4 (1.93 m)   Wt 111.6 kg   SpO2 99%   BMI 29.94 kg/m   Physical Exam  (all labs ordered are listed, but only abnormal results are displayed) Labs Reviewed  BASIC METABOLIC PANEL WITH GFR - Abnormal; Notable for the following components:      Result Value   Glucose, Bld 137 (*)    BUN 27 (*)    Creatinine, Ser 1.29 (*)    GFR, Estimated 57 (*)    All other components within normal limits  CBC - Abnormal; Notable for the following components:   Hemoglobin 12.8 (*)    All other components within normal limits  HEPATIC FUNCTION PANEL  LIPASE, BLOOD  TROPONIN T, HIGH SENSITIVITY  TROPONIN T, HIGH SENSITIVITY    EKG: EKG Interpretation Date/Time:  Friday June 22 2024 07:17:38 EST Ventricular Rate:  50 PR Interval:  193 QRS Duration:  92 QT Interval:  447 QTC Calculation: 408 R Axis:   -27  Text Interpretation: Sinus rhythm Borderline left axis deviation Abnormal R-wave progression, late transition Nonspecific T abnrm,  anterolateral leads Confirmed by Darra Chew [phone]) on 06/22/2024 7:30:43 AM  Radiology: DG Chest 2 View Result Date: 06/22/2024 CLINICAL DATA:  Chest pain. EXAM: CHEST - 2 VIEW COMPARISON:  07/17/2023 FINDINGS: Both lungs are clear. Heart and mediastinum are within normal limits. Trachea is midline. No pleural effusions. No acute bone abnormality. IMPRESSION: No active cardiopulmonary disease. Electronically Signed   By: Juliene Balder M.D.   On: 06/22/2024 08:06    {Document cardiac monitor, telemetry assessment procedure when appropriate:32947} Procedures   Medications Ordered in the ED  pantoprazole  (PROTONIX ) EC tablet 40 mg (40 mg Oral Given 06/22/24 0828)   alum & mag hydroxide-simeth (MAALOX/MYLANTA) 200-200-20 MG/5ML suspension 30 mL (30 mLs Oral Given 06/22/24 0828)  lidocaine  (XYLOCAINE ) 2 % viscous mouth solution 15 mL (15 mLs Mouth/Throat Given 06/22/24 0828)      {Click here for ABCD2, HEART and other calculators REFRESH Note before signing:1}                              Medical Decision Making Amount and/or Complexity of Data Reviewed Labs: ordered. Radiology: ordered.  Risk OTC drugs. Prescription drug management.   ***  {Document critical care time when appropriate  Document review of labs and clinical decision tools ie CHADS2VASC2, etc  Document your independent review of radiology images and any outside records  Document your discussion with family members, caretakers and with consultants  Document social determinants of health affecting pt's care  Document your decision making why or why not admission, treatments were needed:32947:::1}   Final diagnoses:  None    ED Discharge Orders     None        "

## 2024-06-22 NOTE — ED Notes (Signed)
 CCMD called.

## 2024-06-22 NOTE — Discharge Instructions (Addendum)
 You were seen in the ER today for evaluation of your chest pain. I think this is likely from your belching. Please make sure that you are taking your acid reflux medication. I would like for you to follow up with your GI office as well. Call to schedule an appointment. With your reflux, some diet changes will need to happen. I have included more information on this into your discharge paperwork for you to review. If you have any concerns, new or worsening symptoms, please return to the nearest ER for re-evaluation.   Contact a health care provider if: You have new symptoms. You have trouble: Drinking. Swallowing. Eating. It hurts to swallow. You have wheezing. You have a cough that won't go away. Your voice is hoarse. Your symptoms don't get better with treatment. Get help right away if: You have pain all of a sudden in your: Arm. Neck. Jaw. Teeth. Back. You feel sweaty, dizzy, or light-headed all of a sudden. You faint. You have chest pain or shortness of breath. You vomit and the vomit is: Green, yellow, or black. Looks like blood or coffee grounds. Your poop is red, bloody, or black. These symptoms may be an emergency. Call 911 right away. Do not wait to see if the symptoms will go away. Do not drive yourself to the hospital.

## 2024-06-25 NOTE — Addendum Note (Signed)
 Addended by: BRONWEN MCBURNEY on: 06/25/2024 10:30 AM   Modules accepted: Orders

## 2024-06-25 NOTE — Telephone Encounter (Addendum)
 Pt has sent in a message requesting refills on multiple medications.  LOV: 05/07/24 ACUTE w/Stephanie Waddell 01/04/24 Follow up/Med management w/C Nche FOV: 08/16/2024 with Nche.  Some of the medications requested were sent in August and were given a whole year of refills, but pt has requested that they be sent to a different pharmacy. I am sending him a message asking why this is. I have pended the prescriptions the 2 scripts that are due for refills.   Metoprolol  25mg - last sent 01/04/24 Atorvastatin  40mg - last sent 07/22/23 Divalproex  500mg - last sent 01/04/24 with a year of refills, but requesting a different pharmacy Gabapentin  300mg - last sent 01/04/24 with a year of refills, but requesting a different pharmacy  Glipizide  5mg - last sent 01/04/24 with a year of refills, but requesting a different pharmacy  Acyclovir  Ointment- last sent 08/01/2023

## 2024-06-26 MED ORDER — DIVALPROEX SODIUM ER 500 MG PO TB24: 500.0000 mg | ORAL_TABLET | Freq: Every day | ORAL | 1 refills | Status: AC

## 2024-06-26 MED ORDER — GLIPIZIDE ER 5 MG PO TB24: ORAL_TABLET | ORAL | 1 refills | Status: AC

## 2024-06-26 MED ORDER — ATORVASTATIN CALCIUM 40 MG PO TABS: 40.0000 mg | ORAL_TABLET | Freq: Every evening | ORAL | 2 refills | Status: AC

## 2024-06-26 MED ORDER — GABAPENTIN 300 MG PO CAPS: ORAL_CAPSULE | ORAL | 1 refills | Status: AC

## 2024-06-26 MED ORDER — ACYCLOVIR 5 % EX OINT: 1.0000 | TOPICAL_OINTMENT | Freq: Four times a day (QID) | CUTANEOUS | 0 refills | Status: AC | PRN

## 2024-06-26 MED ORDER — METOPROLOL TARTRATE 25 MG PO TABS: 12.5000 mg | ORAL_TABLET | Freq: Every day | ORAL | 1 refills | Status: AC

## 2024-06-26 NOTE — Addendum Note (Signed)
 Addended by: KATHEEN ROSELIE CROME on: 06/26/2024 09:29 AM   Modules accepted: Orders

## 2024-08-14 ENCOUNTER — Ambulatory Visit

## 2024-08-16 ENCOUNTER — Ambulatory Visit: Admitting: Nurse Practitioner

## 2024-08-17 ENCOUNTER — Ambulatory Visit
